# Patient Record
Sex: Male | Born: 1935
Health system: Southern US, Community
[De-identification: ages and names within clinical notes are randomized; demographics above are authoritative.]

## PROBLEM LIST (undated history)

## (undated) DIAGNOSIS — H269 Unspecified cataract: Secondary | ICD-10-CM

## (undated) DIAGNOSIS — K56609 Unspecified intestinal obstruction, unspecified as to partial versus complete obstruction: Secondary | ICD-10-CM

## (undated) DIAGNOSIS — T7840XA Allergy, unspecified, initial encounter: Secondary | ICD-10-CM

## (undated) DIAGNOSIS — R0600 Dyspnea, unspecified: Secondary | ICD-10-CM

## (undated) DIAGNOSIS — D649 Anemia, unspecified: Secondary | ICD-10-CM

## (undated) DIAGNOSIS — Z87442 Personal history of urinary calculi: Secondary | ICD-10-CM

## (undated) DIAGNOSIS — J189 Pneumonia, unspecified organism: Secondary | ICD-10-CM

## (undated) DIAGNOSIS — E785 Hyperlipidemia, unspecified: Secondary | ICD-10-CM

## (undated) DIAGNOSIS — F329 Major depressive disorder, single episode, unspecified: Secondary | ICD-10-CM

## (undated) DIAGNOSIS — M1990 Unspecified osteoarthritis, unspecified site: Secondary | ICD-10-CM

## (undated) DIAGNOSIS — Z5189 Encounter for other specified aftercare: Secondary | ICD-10-CM

## (undated) DIAGNOSIS — F32A Depression, unspecified: Secondary | ICD-10-CM

## (undated) DIAGNOSIS — F419 Anxiety disorder, unspecified: Secondary | ICD-10-CM

## (undated) HISTORY — DX: Unspecified cataract: H26.9

## (undated) HISTORY — PX: COLONOSCOPY: SHX174

## (undated) HISTORY — DX: Anemia, unspecified: D64.9

## (undated) HISTORY — DX: Allergy, unspecified, initial encounter: T78.40XA

## (undated) HISTORY — DX: Encounter for other specified aftercare: Z51.89

## (undated) HISTORY — PX: EYE SURGERY: SHX253

## (undated) HISTORY — DX: Hyperlipidemia, unspecified: E78.5

## (undated) HISTORY — PX: ABDOMINAL SURGERY: SHX537

## (undated) HISTORY — PX: CHOLECYSTECTOMY: SHX55

## (undated) HISTORY — PX: APPENDECTOMY: SHX54

## (undated) HISTORY — PX: COLOSTOMY REVERSAL: SHX5782

## (undated) HISTORY — DX: Major depressive disorder, single episode, unspecified: F32.9

## (undated) HISTORY — DX: Depression, unspecified: F32.A

## (undated) HISTORY — PX: COLOSTOMY: SHX63

## (undated) HISTORY — PX: JOINT REPLACEMENT: SHX530

---

## 1998-03-08 ENCOUNTER — Encounter: Payer: Self-pay | Admitting: Orthopaedic Surgery

## 1998-03-12 ENCOUNTER — Encounter: Payer: Self-pay | Admitting: Orthopaedic Surgery

## 1998-03-12 ENCOUNTER — Inpatient Hospital Stay (HOSPITAL_COMMUNITY): Admission: RE | Admit: 1998-03-12 | Discharge: 1998-03-17 | Payer: Self-pay | Admitting: Orthopaedic Surgery

## 1998-03-13 ENCOUNTER — Encounter: Payer: Self-pay | Admitting: Orthopaedic Surgery

## 1998-03-16 ENCOUNTER — Encounter: Payer: Self-pay | Admitting: Family Medicine

## 1998-03-28 ENCOUNTER — Encounter (HOSPITAL_COMMUNITY): Admission: RE | Admit: 1998-03-28 | Discharge: 1998-06-26 | Payer: Self-pay | Admitting: Orthopaedic Surgery

## 1998-04-23 ENCOUNTER — Ambulatory Visit (HOSPITAL_COMMUNITY): Admission: RE | Admit: 1998-04-23 | Discharge: 1998-04-23 | Payer: Self-pay | Admitting: Family Medicine

## 1999-02-07 ENCOUNTER — Encounter: Admission: RE | Admit: 1999-02-07 | Discharge: 1999-02-07 | Payer: Self-pay | Admitting: Orthopaedic Surgery

## 1999-02-07 ENCOUNTER — Encounter: Payer: Self-pay | Admitting: Orthopaedic Surgery

## 1999-09-26 ENCOUNTER — Encounter: Payer: Self-pay | Admitting: Family Medicine

## 1999-09-26 ENCOUNTER — Ambulatory Visit (HOSPITAL_COMMUNITY): Admission: RE | Admit: 1999-09-26 | Discharge: 1999-09-26 | Payer: Self-pay | Admitting: Family Medicine

## 2003-09-21 ENCOUNTER — Encounter: Admission: RE | Admit: 2003-09-21 | Discharge: 2003-09-21 | Payer: Self-pay | Admitting: Family Medicine

## 2005-02-04 ENCOUNTER — Inpatient Hospital Stay (HOSPITAL_COMMUNITY): Admission: EM | Admit: 2005-02-04 | Discharge: 2005-02-07 | Payer: Self-pay | Admitting: *Deleted

## 2005-09-24 ENCOUNTER — Inpatient Hospital Stay (HOSPITAL_COMMUNITY): Admission: RE | Admit: 2005-09-24 | Discharge: 2005-09-27 | Payer: Self-pay | Admitting: Orthopaedic Surgery

## 2005-09-25 ENCOUNTER — Ambulatory Visit: Payer: Self-pay | Admitting: Physical Medicine & Rehabilitation

## 2009-09-05 ENCOUNTER — Inpatient Hospital Stay (HOSPITAL_COMMUNITY): Admission: EM | Admit: 2009-09-05 | Discharge: 2009-09-13 | Payer: Self-pay | Admitting: Emergency Medicine

## 2009-09-05 ENCOUNTER — Encounter (INDEPENDENT_AMBULATORY_CARE_PROVIDER_SITE_OTHER): Payer: Self-pay | Admitting: Surgery

## 2010-06-30 LAB — COMPREHENSIVE METABOLIC PANEL
ALT: 37 U/L (ref 0–53)
ALT: 140 U/L — ABNORMAL HIGH (ref 0–53)
ALT: 143 U/L — ABNORMAL HIGH (ref 0–53)
ALT: 88 U/L — ABNORMAL HIGH (ref 0–53)
AST: 25 U/L (ref 0–37)
AST: 114 U/L — ABNORMAL HIGH (ref 0–37)
AST: 115 U/L — ABNORMAL HIGH (ref 0–37)
AST: 65 U/L — ABNORMAL HIGH (ref 0–37)
Albumin: 2.7 g/dL — ABNORMAL LOW (ref 3.5–5.2)
Albumin: 2.7 g/dL — ABNORMAL LOW (ref 3.5–5.2)
Albumin: 2.9 g/dL — ABNORMAL LOW (ref 3.5–5.2)
Albumin: 3.8 g/dL (ref 3.5–5.2)
Alkaline Phosphatase: 52 U/L (ref 39–117)
Alkaline Phosphatase: 56 U/L (ref 39–117)
Alkaline Phosphatase: 68 U/L (ref 39–117)
Alkaline Phosphatase: 73 U/L (ref 39–117)
BUN: 11 mg/dL (ref 6–23)
BUN: 11 mg/dL (ref 6–23)
BUN: 12 mg/dL (ref 6–23)
BUN: 12 mg/dL (ref 6–23)
CO2: 23 mEq/L (ref 19–32)
CO2: 26 mEq/L (ref 19–32)
CO2: 27 mEq/L (ref 19–32)
CO2: 29 mEq/L (ref 19–32)
Calcium: 8.7 mg/dL (ref 8.4–10.5)
Calcium: 8.2 mg/dL — ABNORMAL LOW (ref 8.4–10.5)
Calcium: 8.4 mg/dL (ref 8.4–10.5)
Calcium: 9.3 mg/dL (ref 8.4–10.5)
Chloride: 113 mEq/L — ABNORMAL HIGH (ref 96–112)
Chloride: 100 mEq/L (ref 96–112)
Chloride: 102 mEq/L (ref 96–112)
Chloride: 99 mEq/L (ref 96–112)
Creatinine, Ser: 0.8 mg/dL (ref 0.4–1.5)
Creatinine, Ser: 1.11 mg/dL (ref 0.4–1.5)
Creatinine, Ser: 1.13 mg/dL (ref 0.4–1.5)
Creatinine, Ser: 1.25 mg/dL (ref 0.4–1.5)
GFR calc Af Amer: >60 mL/min (ref >60)
GFR calc Af Amer: >60 mL/min (ref >60)
GFR calc Af Amer: >60 mL/min (ref >60)
GFR calc Af Amer: >60 mL/min (ref >60)
GFR calc non Af Amer: >60 mL/min (ref >60)
GFR calc non Af Amer: 57 mL/min — ABNORMAL LOW (ref >60)
GFR calc non Af Amer: >60 mL/min (ref >60)
GFR calc non Af Amer: >60 mL/min (ref >60)
Glucose, Bld: 134 mg/dL — ABNORMAL HIGH (ref 70–99)
Glucose, Bld: 122 mg/dL — ABNORMAL HIGH (ref 70–99)
Glucose, Bld: 144 mg/dL — ABNORMAL HIGH (ref 70–99)
Glucose, Bld: 157 mg/dL — ABNORMAL HIGH (ref 70–99)
Potassium: 3.8 mEq/L (ref 3.5–5.1)
Potassium: 3.3 mEq/L — ABNORMAL LOW (ref 3.5–5.1)
Potassium: 3.7 mEq/L (ref 3.5–5.1)
Potassium: 4 mEq/L (ref 3.5–5.1)
Sodium: 142 mEq/L (ref 135–145)
Sodium: 133 mEq/L — ABNORMAL LOW (ref 135–145)
Sodium: 135 mEq/L (ref 135–145)
Sodium: 136 mEq/L (ref 135–145)
Total Bilirubin: 0.8 mg/dL (ref 0.3–1.2)
Total Bilirubin: 1.7 mg/dL — ABNORMAL HIGH (ref 0.3–1.2)
Total Bilirubin: 2.2 mg/dL — ABNORMAL HIGH (ref 0.3–1.2)
Total Bilirubin: 2.9 mg/dL — ABNORMAL HIGH (ref 0.3–1.2)
Total Protein: 6.5 g/dL (ref 6.0–8.3)
Total Protein: 6.1 g/dL (ref 6.0–8.3)
Total Protein: 6.3 g/dL (ref 6.0–8.3)
Total Protein: 7.3 g/dL (ref 6.0–8.3)

## 2010-06-30 LAB — CBC
HCT: 38.6 % — ABNORMAL LOW (ref 39.0–52.0)
HCT: 33.3 % — ABNORMAL LOW (ref 39.0–52.0)
HCT: 36.4 % — ABNORMAL LOW (ref 39.0–52.0)
HCT: 36.5 % — ABNORMAL LOW (ref 39.0–52.0)
HCT: 42.3 % (ref 39.0–52.0)
Hemoglobin: 13.1 g/dL (ref 13.0–17.0)
Hemoglobin: 11.3 g/dL — ABNORMAL LOW (ref 13.0–17.0)
Hemoglobin: 12.3 g/dL — ABNORMAL LOW (ref 13.0–17.0)
Hemoglobin: 12.5 g/dL — ABNORMAL LOW (ref 13.0–17.0)
Hemoglobin: 14.2 g/dL (ref 13.0–17.0)
MCHC: 34 g/dL (ref 30.0–36.0)
MCHC: 33.5 g/dL (ref 30.0–36.0)
MCHC: 33.7 g/dL (ref 30.0–36.0)
MCHC: 33.9 g/dL (ref 30.0–36.0)
MCHC: 34.2 g/dL (ref 30.0–36.0)
MCV: 96.6 fL (ref 78.0–100.0)
MCV: 96.7 fL (ref 78.0–100.0)
MCV: 96.7 fL (ref 78.0–100.0)
MCV: 97 fL (ref 78.0–100.0)
MCV: 97.5 fL (ref 78.0–100.0)
Platelets: 408 10*3/uL — ABNORMAL HIGH (ref 150–400)
Platelets: 194 10*3/uL (ref 150–400)
Platelets: 197 10*3/uL (ref 150–400)
Platelets: 212 10*3/uL (ref 150–400)
Platelets: 339 10*3/uL (ref 150–400)
RBC: 4 MIL/uL — ABNORMAL LOW (ref 4.22–5.81)
RBC: 3.44 MIL/uL — ABNORMAL LOW (ref 4.22–5.81)
RBC: 3.73 MIL/uL — ABNORMAL LOW (ref 4.22–5.81)
RBC: 3.76 MIL/uL — ABNORMAL LOW (ref 4.22–5.81)
RBC: 4.38 MIL/uL (ref 4.22–5.81)
RDW: 14 % (ref 11.5–15.5)
RDW: 13 % (ref 11.5–15.5)
RDW: 13.1 % (ref 11.5–15.5)
RDW: 13.1 % (ref 11.5–15.5)
RDW: 13.4 % (ref 11.5–15.5)
WBC: 7.8 10*3/uL (ref 4.0–10.5)
WBC: 10.9 10*3/uL — ABNORMAL HIGH (ref 4.0–10.5)
WBC: 13.9 10*3/uL — ABNORMAL HIGH (ref 4.0–10.5)
WBC: 8.6 10*3/uL (ref 4.0–10.5)
WBC: 8.6 10*3/uL (ref 4.0–10.5)

## 2010-06-30 LAB — DIFFERENTIAL
Basophils Absolute: 0.1 10*3/uL (ref 0.0–0.1)
Basophils Relative: 1 % (ref 0–1)
Eosinophils Absolute: 0 10*3/uL (ref 0.0–0.7)
Eosinophils Relative: 0 % (ref 0–5)
Lymphocytes Relative: 4 % — ABNORMAL LOW (ref 12–46)
Lymphs Abs: 0.6 10*3/uL — ABNORMAL LOW (ref 0.7–4.0)
Monocytes Absolute: 1.3 10*3/uL — ABNORMAL HIGH (ref 0.1–1.0)
Monocytes Relative: 9 % (ref 3–12)
Neutro Abs: 12 10*3/uL — ABNORMAL HIGH (ref 1.7–7.7)
Neutrophils Relative %: 86 % — ABNORMAL HIGH (ref 43–77)

## 2010-06-30 LAB — BASIC METABOLIC PANEL
BUN: 10 mg/dL (ref 6–23)
CO2: 27 mEq/L (ref 19–32)
CO2: 32 mEq/L (ref 19–32)
Calcium: 8.2 mg/dL — ABNORMAL LOW (ref 8.4–10.5)
Calcium: 8.6 mg/dL (ref 8.4–10.5)
Chloride: 101 mEq/L (ref 96–112)
Creatinine, Ser: 0.95 mg/dL (ref 0.4–1.5)
GFR calc Af Amer: >60 mL/min (ref >60)
GFR calc non Af Amer: >60 mL/min (ref >60)
Glucose, Bld: 163 mg/dL — ABNORMAL HIGH (ref 70–99)
Glucose, Bld: 190 mg/dL — ABNORMAL HIGH (ref 70–99)
Potassium: 2.9 mEq/L — ABNORMAL LOW (ref 3.5–5.1)
Sodium: 142 mEq/L (ref 135–145)
Sodium: 145 mEq/L (ref 135–145)

## 2010-06-30 LAB — PROTIME-INR
INR: 1.29 (ref 0.00–1.49)
Prothrombin Time: 16 seconds — ABNORMAL HIGH (ref 11.6–15.2)

## 2010-06-30 LAB — URINE CULTURE
Colony Count: 30000
Special Requests: NEGATIVE

## 2010-06-30 LAB — LIPASE, BLOOD: Lipase: 36 U/L (ref 11–59)

## 2010-06-30 LAB — AMYLASE: Amylase: 66 U/L (ref 0–105)

## 2010-06-30 LAB — APTT: aPTT: 34 seconds (ref 24–37)

## 2010-08-11 ENCOUNTER — Emergency Department (HOSPITAL_COMMUNITY): Payer: Medicare Other

## 2010-08-11 ENCOUNTER — Inpatient Hospital Stay (HOSPITAL_COMMUNITY)
Admission: EM | Admit: 2010-08-11 | Discharge: 2010-08-14 | DRG: 390 | Disposition: A | Discharge status: Home / Self Care | Payer: Medicare Other | Attending: Surgery | Admitting: Surgery

## 2010-08-11 DIAGNOSIS — M459 Ankylosing spondylitis of unspecified sites in spine: Secondary | ICD-10-CM | POA: Diagnosis present

## 2010-08-11 DIAGNOSIS — R197 Diarrhea, unspecified: Secondary | ICD-10-CM | POA: Diagnosis present

## 2010-08-11 DIAGNOSIS — E785 Hyperlipidemia, unspecified: Secondary | ICD-10-CM | POA: Diagnosis present

## 2010-08-11 DIAGNOSIS — Z87442 Personal history of urinary calculi: Secondary | ICD-10-CM

## 2010-08-11 DIAGNOSIS — F3289 Other specified depressive episodes: Secondary | ICD-10-CM | POA: Diagnosis present

## 2010-08-11 DIAGNOSIS — F329 Major depressive disorder, single episode, unspecified: Secondary | ICD-10-CM | POA: Diagnosis present

## 2010-08-11 DIAGNOSIS — Z9089 Acquired absence of other organs: Secondary | ICD-10-CM

## 2010-08-11 DIAGNOSIS — K56609 Unspecified intestinal obstruction, unspecified as to partial versus complete obstruction: Principal | ICD-10-CM | POA: Diagnosis present

## 2010-08-11 LAB — URINALYSIS, ROUTINE W REFLEX MICROSCOPIC
Glucose, UA: NEGATIVE mg/dL
Protein, ur: NEGATIVE mg/dL
pH: 6.5 (ref 5.0–8.0)

## 2010-08-11 LAB — DIFFERENTIAL
Eosinophils Relative: 1 % (ref 0–5)
Lymphocytes Relative: 6 % — ABNORMAL LOW (ref 12–46)
Lymphs Abs: 0.8 10*3/uL (ref 0.7–4.0)
Monocytes Absolute: 0.9 10*3/uL (ref 0.1–1.0)

## 2010-08-11 LAB — CBC
HCT: 46.4 % (ref 39.0–52.0)
MCHC: 34.7 g/dL (ref 30.0–36.0)
MCV: 92.1 fL (ref 78.0–100.0)
RDW: 12.8 % (ref 11.5–15.5)

## 2010-08-11 LAB — COMPREHENSIVE METABOLIC PANEL
BUN: 18 mg/dL (ref 6–23)
Calcium: 9.6 mg/dL (ref 8.4–10.5)
Glucose, Bld: 142 mg/dL — ABNORMAL HIGH (ref 70–99)
Total Protein: 7.9 g/dL (ref 6.0–8.3)

## 2010-08-11 LAB — HEMOGLOBIN AND HEMATOCRIT, BLOOD
HCT: 43.2 % (ref 39.0–52.0)
Hemoglobin: 14.7 g/dL (ref 13.0–17.0)

## 2010-08-11 LAB — AMYLASE: Amylase: 68 U/L (ref 0–105)

## 2010-08-11 MED ORDER — IOHEXOL 300 MG/ML  SOLN
100.0000 mL | Freq: Once | INTRAMUSCULAR | Status: AC | PRN
  Administered 2010-08-11: 100 mL via INTRAVENOUS

## 2010-08-12 ENCOUNTER — Inpatient Hospital Stay (HOSPITAL_COMMUNITY): Payer: Medicare Other

## 2010-08-12 LAB — CBC
HCT: 43.8 % (ref 39.0–52.0)
Hemoglobin: 14.7 g/dL (ref 13.0–17.0)
MCH: 31.3 pg (ref 26.0–34.0)
MCHC: 33.6 g/dL (ref 30.0–36.0)
MCV: 93.2 fL (ref 78.0–100.0)
Platelets: 253 K/uL (ref 150–400)
RBC: 4.7 MIL/uL (ref 4.22–5.81)
RDW: 12.6 % (ref 11.5–15.5)
WBC: 11 K/uL — ABNORMAL HIGH (ref 4.0–10.5)

## 2010-08-12 LAB — BASIC METABOLIC PANEL WITH GFR
BUN: 9 mg/dL (ref 6–23)
CO2: 28 meq/L (ref 19–32)
Calcium: 8.7 mg/dL (ref 8.4–10.5)
Chloride: 103 meq/L (ref 96–112)
Creatinine, Ser: 0.9 mg/dL (ref 0.4–1.5)
GFR calc non Af Amer: >60 mL/min
Glucose, Bld: 116 mg/dL — ABNORMAL HIGH (ref 70–99)
Potassium: 4.1 meq/L (ref 3.5–5.1)
Sodium: 138 meq/L (ref 135–145)

## 2010-08-26 NOTE — H&P (Signed)
Steven [name], Steven [name]                 ACCOUNT NO.:  [id number]  MEDICAL RECORD NO.:  [id number]           PATIENT TYPE:  I  LOCATION:  1527                         FACILITY:  [facility]  PHYSICIAN:  Ardeth Sportsman, MD     DATE OF BIRTH:  [birth year]  DATE OF ADMISSION:  08/11/2010 DATE OF DISCHARGE:                             HISTORY & PHYSICAL   CHIEF COMPLAINT:  Abdominal pain, nausea, vomiting, started last p.m.  BRIEF HISTORY:  The patient is a 76 year old white male who was doing well yesterday, had normal breakfast and lunch.  After dinner, he noted acute onset of abdominal distention, then significant abdominal pain followed by four episodes of nausea and vomiting so far.  In the ER, he was treated with Zofran, and an NG has been placed at this point, but he is still having nausea and thought he was going to throw up.  We reposition his NG tube.  He had an episode two weeks ago that was similar, but it resolved on its own.  PAST MEDICAL HISTORY: 1. He has had recurrent small bowel obstructions dating back October     2006 and 1995. 2. He had a diverticular abscess with resection in 1982 by Dr.     Rolene Course. 3. He had a reanastomosis of his colostomy in 79 by Dr. Abbey Chatters. 4. He had a lysis of adhesions in 2006 by Dr. Luisa Hart.  Additional problems include: 1. Ankylosing spondylitis. 2. Dyslipidemia. 3. History of hypogonadism.  SURGERIES: 1. Status post colectomy on Sep 05, 2009. 2. Exploratory laparotomy in 1995 for small-bowel obstruction. 3. Colon resection reanastomosis 1982 and 1983. 4. Right hip revision in June 2007. 5. History of renal calculi without obstruction.  FAMILY HISTORY:  Father died at 42.  Mother died at 53.  Two brothers, one deceased with emphysema, one in good health.  No sisters.  SOCIAL HISTORY:  Smoked about 18 years, quit in 1968.  He drinks four beers per day.  Drugs:  None.  He is a retired Psychologist, occupational.  He is married.  His wife is  with him.  REVIEW OF SYSTEMS:  FEVER:  None.  WEIGHT:  No changes. CEREBROVASCULAR:  No headaches, dizziness, syncope, history of seizure or stroke.  PSYCH:  No changes.  He has some chronic depression.  He is on medications.  SKIN:  No changes.  CARDIAC:  No history of chest pain, palpitations, or workup.  PULMONARY:  No orthopnea.  No dyspnea on exertion.  No coughing or wheezing or recent URI.  GI:  Had abdominal pain about two weeks ago, resolved on its own, reoccurred last night. Positive GERD currently.  Normally, no nausea, vomiting, diarrhea, constipation, or blood.  GU:  Negative.  LOWER EXTREMITIES:  No edema. No claudication.  MUSCULOSKELETAL:  Positive for ankylosing spondylitis. He takes a nonsteroidal b.i.d.  MEDICATIONS: 1. Simvastatin 40 mg daily. 2. TriCor 145 mg daily. 3. Celebrex 200 mg b.i.d. 4. __________ 40 mg daily. 5. Folbic one daily. 6. Calcium 600 mg b.i.d. 7. Vitamin D.  ALLERGIES:  INDOCIN causes hives.  PHYSICAL EXAM:  GENERAL:  He is a well-nourished, well-developed white male in no acute distress.  His abdomen is distended.  He is fairly uncomfortable.  Currently, pain is much better than before. VITAL SIGNS:  Temperature was 98.1, heart rate 75, blood pressure 135/85, respiratory rate is 70, sats 95% on room air. HEAD:  Normocephalic. EYES:  Pupils are equal, reactive to light. EARS, NOSE, THROAT, AND MOUTH:  Within normal limits. NECK:  No bruits.  No JVD.  No thyromegaly. CHEST:  Clear to auscultation and percussion. CARDIAC:  No murmur or rub was heard.  Normal S1 and S2.  Pulses are +2 and equal in upper and lower extremities. ABDOMEN:  Hyperactive bowel sounds.  Abdomen is markedly distended, slightly tender, not as much as before.  No hernia, masses, or abscesses.  There are multiple surgical scars. GU/RECTAL:  Deferred.  Lymphadenopathy none palpated. MUSCULOSKELETAL:  No acute changes noted.  He has ankylosing spondylitis. SKIN:  No  changes.  Decubitus, none. NEUROLOGIC: No focal deficits.  Cranial nerves are grossly normal. PSYCH:  Normal affect.  LABORATORY DATA:  UA was negative.  White count is 13.4, hemoglobin of 16, hematocrit 46, platelets of 269,000.  Sodium is 139, potassium is 4.3, chloride is 0.1, CO2 is 26, BUN is 18, creatinine is 0.93, glucose 142.  LFTs were normal.  Three-way shows air-fluid levels of small bowel, ileus versus mechanical obstruction.  CT shows mechanical small bowel obstructions, transition anterior abdomen, ? adhesions, similar to the February 04, 2005, film.  Nephrolithiasis, mild obstruction.  IMPRESSION: 1. Small bowel obstruction with multiple surgeries.  Prior small bowel     obstruction with lysis of adhesions. 2. History of cholecystectomy. 3. History of depression. 4. Dyslipidemia. 5. On nonsteroidal anti-inflammatory drugs. 6. Renal calculus.  PLAN:  NG tube is currently working.  There was some bloody-looking drainage in the NG.  We will guaiac it, and I will put him on H2 blocker.  Keep him on bowel rest.  Maintain NG drainage.  Hope that with that, his obstruction resolves.     Eber Hong, P.A.   ______________________________ Ardeth Sportsman, MD    WDJ/MEDQ  D:  08/11/2010  T:  08/11/2010  Job:  [id number]  cc:   Thayer Headings, M.D. Fax: [phone]  Electronically Signed by Sherrie George P.A. on 08/18/2010 04:22:34 PM Electronically Signed by Karie Soda MD on 08/26/2010 12:04:11 PM

## 2010-08-26 NOTE — Discharge Summary (Signed)
NAMEDONTRE, LADUCA                 ACCOUNT NO.:  [id number]  MEDICAL RECORD NO.:  [id number]           PATIENT TYPE:  I  LOCATION:  1527                         FACILITY:  [facility]  PHYSICIAN:  Ardeth Sportsman, MD     DATE OF BIRTH:  03/31/1935  DATE OF ADMISSION:  08/11/2010 DATE OF DISCHARGE:  08/14/2010                              DISCHARGE SUMMARY   ADMISSION DIAGNOSES: 1. Small bowel obstruction status post multiple surgeries and prior     small bowel obstruction with lysis of adhesions. 2. History of cholecystectomy. 3. History of depression. 4. Dyslipidemia. 5. The patient is on nonsteroidal anti-inflammatory drugs. 6. History of renal calculi. 7. Ankylosing spondylitis. 8. History of hypogonadism.  DISCHARGE DIAGNOSES: 1. Small bowel obstruction status post multiple surgeries and prior     small bowel obstruction with lysis of adhesions. 2. History of cholecystectomy. 3. History of depression. 4. Dyslipidemia. 5. The patient is on nonsteroidal anti-inflammatory drugs. 6. History of renal calculi. 7. Ankylosing spondylitis. 8. History of hypogonadism.  PROCEDURES:  CT of the abdomen and pelvis on August 11, 2010, which shows partial mechanical small bowel obstruction with potential transition point in the mid anterior abdomen, possibly related to adhesions similar to partial obstruction of February 04, 2005.  Chronic dilatation of small bowel loops in left lower quadrant and nephrolithiasis, nonobstructive.  BRIEF HISTORY:  The patient is a 76 year old gentleman who was doing well the day prior to admission, had a normal breakfast and lunch and after dinner he had acute onset of abdominal distention, then significant abdominal pain followed by 4 episodes of nausea and vomiting.  In the ER, he was treated with Zofran.  NG has been placed. At this point, he is still having nausea and thought that he was going to throw up when I came in.  We repositioned the NG tube and  his nausea seemed to improve.  He had a similar episode 2 weeks prior, but it resolved on its own and did not last as long.  PAST MEDICAL HISTORY:  Past medical history includes a history of small bowel obstruction back in 2006 and 1995, diverticular abscess with resection by Dr. Rolene Course, I think, in 1982 and then he had reanastomosis of his colostomy in 1983 by Dr. Abbey Chatters.  He had lysis of adhesions by Dr. Luisa Hart in 2006.  He has had a cholecystectomy.  For further history and physical, please see the dictated note.  HOSPITAL COURSE:  The patient was seen in the emergency room and admitted with a small bowel obstruction.  NG tube was placed.  He was placed on bowel rest and IV fluids.  We also placed him on a PPI for possible GERD symptoms.  He made slow, steady progress with minimal drainage through his NG that was actually repositioned on Aug 12, 2010, but he never used it, he remained clamped, so we removed the NG on Aug 13, 2010, and started him on clear liquids.  By Aug 14, 2010, he was up to a soft diet for breakfast and doing well.  He was having multiple stools.  He has  a problem with chronic diarrhea since his cholecystectomy.  He also had some hemorrhoidal bleeding with his multiple bouts of diarrhea.  At this point, I plan to saline lock his IV, mobilize him and see how he does with lunch and if he does well, plan to discharge him home.  MEDICATIONS ON DISCHARGE:  He will go home on all of his preadmission medicines, which include: 1. AndroGel 1 packet topically daily. 2. Imodium 1 tablet p.r.n. 3. Calcium carbonate 1 tablet daily. 4. Celebrex 200 mg b.i.d. 5. Citalopram 40 mg daily. 6. Folic 25/2.5/2 one daily. 7. Simvastatin 40 mg daily. 8. TriCor 145 mg daily. 9. Vitamin D 400 units daily. 10.The only new drug will be Anusol-HC.  We will send home a tube with     him.  FOLLOWUP:  He was instructed to call our office if he has any further problems.  We can see  him back.  If not, he can follow up with Dr. Ronne Binning, his primary care physician on a routine basis.     Eber Hong, P.A.   ______________________________ Ardeth Sportsman, MD    WDJ/MEDQ  D:  08/14/2010  T:  08/14/2010  Job:  [id number]  cc:   Thayer Headings, M.D. Fax: [phone]  Ardeth Sportsman, MD [address] [place] [address]  Electronically Signed by Sherrie George P.A. on 08/18/2010 04:23:57 PM Electronically Signed by Karie Soda MD on 08/26/2010 12:04:08 PM

## 2010-08-29 NOTE — Discharge Summary (Signed)
NAMEJAKARIE, PEMBER                 ACCOUNT NO.:  [id number]   MEDICAL RECORD NO.:  [id number]          PATIENT TYPE:  INP   LOCATION:  1618                         FACILITY:  [facility]   PHYSICIAN:  Hillery Aldo, M.D.   DATE OF BIRTH:  1934/10/21   DATE OF ADMISSION:  02/03/2005  DATE OF DISCHARGE:  02/07/2005                                 DISCHARGE SUMMARY   PRIMARY CARE PHYSICIAN:  Dr. [name].   DISCHARGE DIAGNOSES:  1.  Recurrent small bowel obstruction, resolved.  2.  Nausea and vomiting secondary to #1, resolved.  3.  Depression.  4.  Hypercholesterolemia.  5.  Ankylosing spondylitis.  6.  History of diverticulitis.  7.  Nonobstructing bilateral renal calculi.   DISCHARGE MEDICATIONS:  1.  Zocor 40 mg daily.  2.  Tricor 160 mg daily.  3.  Citalopram 20 mg daily.  4.  Calcium supplement 500 mg twice daily.  5.  Celebrex 200 mg daily.   CONSULTANTS:  Dr. Derrell Lolling of general surgery.   PROCEDURES AND DIAGNOSTIC STUDIES:  1.  Acute abdominal series on February 03, 2005, showed findings consistent      with a partial small bowel obstruction.  2.  CT scan of the abdomen and pelvis on February 04, 2005, showed small      bowel obstruction with a transition point in the left lower abdomen and      nonobstructing bilateral renal calculi.  The small bowel obstruction was      thought to be likely mid distal jejunum.  3.  Two views of the abdomen on February 05, 2005, showed decompression of      the bowel with an NG tube in place.  Ankylosing spondylitis was also      seen.  4.  Two views of the abdomen on February 06, 2005, showed a nonspecific,      nonobstructive bowel gas pattern with no evidence for ileus.   DISCHARGE LABORATORY VALUES:  CBC showed a white blood cell count of 5.4,  hemoglobin 12.8, hematocrit 37.1, platelets 279.  Sodium was 140, potassium  3.6, chloride 101, bicarb 29, BUN 7, creatinine 1.0, glucose 101.   BRIEF ADMISSION HISTORY OF PRESENT ILLNESS:   The patient is a 76 year old  white male, who was admitted with a 12 hour history of nausea, vomiting, and  generalized abdominal pain.  He had a history of small bowel obstructions in  the past as well as a history of bowel resection and adhesiolysis.  He was  admitted for further evaluation, surgical consultation, and treatment.   HOSPITAL COURSE BY PROBLEM:  #1 - NAUSEA AND VOMITING SECONDARY TO SMALL  BOWEL OBSTRUCTION:  The patient was admitted, and an NG tube was placed.  This resulted in rapid decompression of his small bowel with resolution of  his nausea and vomiting.  He also received antiemetics p.r.n.  A surgery  consultation was obtained and kindly provided by Dr. Derrell Lolling.  With  conservative treatment and NPO status, the patient was able to tolerate  clear liquids once the NG tube was out on  hospital day #2.  His diet was  gradually progressed and at the time of discharge, was able to tolerate a  low residue diet without any recurrence of nausea, vomiting, or abdominal  pain.  He was moving his bowels normally prior to discharge.   #2 - HYPERCHOLESTEROLEMIA:  The patient was maintained on his usual dose of  Tricor and Zocor once he was able to take p.o.'s.   #3 - ANKYLOSING SPONDYLITIS:  The patient received pain medications p.r.n.   #4 - DEPRESSION:  The patient was discharged on his home dose of citalopram.   DISPOSITION:  The patient will be discharged home.  He is to follow up with  his primary care physician, Dr. Artis Flock, in 1-2 weeks.  He should call his  primary care physician for any return of nausea, vomiting, or abdominal  pain.   DISCHARGE INSTRUCTIONS:  The patient was instructed to increase activity  slowly and to eat a low residue diet until he is seen by his primary care  physician in follow up.   CONDITION AT DISCHARGE:  Improved.           ______________________________  Hillery Aldo, M.D.     CR/MEDQ  D:  02/07/2005  T:  02/07/2005  Job:   [id number]   cc:   Quita Skye. Artis Flock, M.D.  Fax: [phone]   Angelia Mould. Derrell Lolling, M.D.  [address]  [place]  [place] [address]

## 2010-08-29 NOTE — H&P (Signed)
NAMESERIGNE, Steven [name]                 ACCOUNT NO.:  [id number]   MEDICAL RECORD NO.:  [id number]          PATIENT TYPE:  EMS   LOCATION:  ED                           FACILITY:  [facility]   PHYSICIAN:  Renato Battles, M.D.     DATE OF BIRTH:  August 03, 1934   DATE OF ADMISSION:  02/04/2005  DATE OF DISCHARGE:                                HISTORY & PHYSICAL   REASON FOR ADMISSION:  Nausea and vomiting.   PRIMARY CARE PHYSICIAN:  Quita Skye. Artis Flock, M.D.   SURGEON:  Adolph Pollack, M.D.   HISTORY OF PRESENT ILLNESS:  The patient is a 76 year old white male with 12  hours of nausea, vomiting, and generalized abdominal pain which started  early afternoon.   REVIEW OF SYSTEMS:  CONSTITUTIONAL SYMPTOMS:  No fever, chills, or night  sweats.  No weight changes.  GI:  Positive for nausea, vomiting, and  abdominal pain.  No diarrhea or constipation.  GU:  No dysuria, hematuria,  or retention.  CARDIOPULMONARY:  No chest pain, shortness of breath, or  orthopnea.  No PND or cough.   PAST MEDICAL HISTORY:  1.  History of diverticulitis.  2.  Hypercholesterolemia.  3.  History of small bowel obstruction requiring surgery.  4.  Depression.   PAST SURGICAL HISTORY:  1.  Aortic replacement.  2.  Bowel resection.  3.  Adhesiolysis.   FAMILY HISTORY:  Negative.   SOCIAL HISTORY:  Positive for 3-4 beers a day.  No tobacco or drugs.  He is  retired.  He lives with his wife.   ALLERGIES:  Indomethacin.   HOME MEDICATIONS:  1.  Zocor 40 mg p.o. nightly.  2.  Celebrex, unknown dose.  3.  Triglide 160 mg daily.  4.  Citalopram 20 mg daily.  5.  Calcium supplement.   PHYSICAL EXAMINATION:  GENERAL:  Alert and oriented x3, in no acute  distress.  VITAL SIGNS:  Temperature 98.3, heart rate 74, respiratory rate 16, blood  pressure 126/75.  HEENT:  Atraumatic and normocephalic.  Pupils equal, round and reactive to  light and accommodation.  Extraocular movements intact bilaterally.  NECK:  No  lymphadenopathy, no thyromegaly, no JVD.  CHEST:  Clear to auscultation bilaterally.  No wheezes, rales, or rhonchi.  HEART:  Regular rate and rhythm.  No murmurs noted.  ABDOMEN:  Distended, soft.  Tender in the left lower quadrant.  Bowel sounds  are absent in the upper half of the abdomen, but present in the lower half.  EXTREMITIES:  No cyanosis, edema, or clubbing.   LABORATORY STUDIES:  CBC showed elevated white count at 11.4 with 90%  neutrophils, but normal hemoglobin and platelets.  Electrolytes were all  within normal limits.  Glucose was mildly elevated to 135.  Liver functions  were perfectly normal.  Urinalysis was negative.  Lipase was normal.  Abdominopelvic CT showed small bowel obstruction with no free air.   ASSESSMENT:  1.  Small bowel obstruction, probably secondary adhesions.  2.  Hypercholesterolemia.  3.  Depression.  4.  Significant intake of alcohol on a daily  basis.   PLAN:  1.  Admit to medical floor.  2.  NPO.  3.  NG tube placement to wall suction.  4.  Dulcolax suppositories.  5.  Continue home medications.  6.  Thiamine and folate daily.      Renato Battles, M.D.  Electronically Signed     SA/MEDQ  D:  02/04/2005  T:  02/04/2005  Job:  [id number]   cc:   Quita Skye. Artis Flock, M.D.  Fax: [phone]   Adolph Pollack, M.D.  [address]  [place]  [place] [address]

## 2010-08-29 NOTE — H&P (Signed)
Steven [name], Steven [name]                 ACCOUNT NO.:  [id number]   MEDICAL RECORD NO.:  [id number]          PATIENT TYPE:  INP   LOCATION:  NA                           FACILITY:  [facility]   PHYSICIAN:  Steven [name], M.D.DATE OF BIRTH:  04-29-1934   DATE OF ADMISSION:  09/24/2005  DATE OF DISCHARGE:                                HISTORY & PHYSICAL   CHIEF COMPLAINT:  Right hip pain for the last 10-12 months.   HISTORY OF PRESENT ILLNESS:  A 76 year old white male patient presented to  [name] with a history of a right total hip done by him in November  1999.  Around 10-12 months ago he had gradual onset of progressively  worsening right hip pain.  He has had no known injury and no further surgery  on that right hip.  He reports about 12 months ago the hip started feeling  like it was kind of sliding out of place and then going right back in, and  now this has increased in frequency that it occurs almost daily.  It is not  completely subluxed where it has required a closed reduction, but it is  getting a bit painful.   At this point the pain is an intermittent dull aching sensation over the  right buttock and trochanter region without radiation.  Pain increases if he  sits for a prolonged period of time or stands for a prolonged period of time  and then decreases with range of motion.  He reports can make it feel like  it will slide out of place if he bends down and ties his shoes.  The hip  does pop.  He otherwise can tie his shoes without difficulty.  He does not  ambulate with any assistive devices.   ALLERGIES:  INDOCIN causes hives.   CURRENT MEDICATIONS:  1.  AndroGel 1% topically applied once a day.  2.  Calcium plus vitamin D 600 mg 1 tablet p.o. b.i.d.  3.  Simvastatin 40 mg 1 tablet p.o. q.a.m.  4.  Celebrex 200 mg 1 tablet p.o. b.i.d.  5.  Tricor 145 mg 1 tablet p.o. q.a.m.  6.  Citalopram 40 mg 1 tablet p.o. q.a.m.  7.  Foltx 2.5/25/2 mg 1 tablet p.o. q.a.m.  8.  Vicodin 5/500 mg 1 tablet p.o. b.i.d.   PAST MEDICAL HISTORY:  1.  Ankylosing spondylitis.  2.  Hyperlipidemia.  3.  Hypogonadism.  4.  History of multiple intestinal blockages requiring surgery, most      recently in October 2006, that was treated conservatively without      surgery.   He denies any history of hypertension, diabetes mellitus, heart disease,  thyroid disease, hiatal hernia, reflux, peptic ulcer disease or asthma or  any other chronic medical condition other than noted previously.   PAST SURGICAL HISTORY:  1.  Right total hip arthroplasty March 12, 1998.  2.  I&D and exploratory laparotomy of an intestinal abscess with formation      of colostomy December 30, 1993.  3.  Resection of his lower intestine with reverse of  his colostomy March      1996.   He denies any complications from the above-mentioned procedures.   SOCIAL HISTORY:  He has a 10 pack-year history of cigarette smoking, which  she quit in 1970.  He drinks alcohol every day, about 4 drinks a day.  He  does not use any snuff nor any drugs.  He is married and lives with his wife  in a [place] house.  He does have two living children.  His medical doctor  is Dr. Bradd [name].   FAMILY HISTORY:  Mother had a history of heart disease, heart attack,  diabetes and breast cancer.  Brother had a history of heart disease, heart  attack, diabetes and pancreatic cancer.   REVIEW OF SYSTEMS:  He does wear glasses at all times.  He has hearing aids  for both ears.  He does have some problems with ringing in his ears.  All  other systems are negative and noncontributory.   PHYSICAL EXAMINATION:  GENERAL:  Well-developed, well-nourished white male  in no acute distress.  Talks easily with examiner.  He has extensive  kyphotic back.  Walks without a limp.  Mood and affect are appropriate.  Height 5 feet 8 inches, weight 157 pounds, BMI is 23.  VITAL SIGNS:  Temperature 97.5 degrees Fahrenheit, pulse 64,  respirations 20  and BP 118/60.  HEENT:  Normocephalic, atraumatic, without frontal or maxillary sinus  tenderness to palpation.  Conjunctivae pink.  Sclerae anicteric.  PERRLA.  EOMs intact.  No visible external ear deformities.  Hearing grossly intact.  Tympanic membranes pearly gray bilaterally with good light reflex.  Nose and  nasal septum midline.  Nasal mucosa pink and moist without exudates or  polyps noted.  Buccal mucosa pink and moist.  Dentition in good repair.  Pharynx without erythema or exudates.  Tongue and uvula midline.  Tongue  without fasciculations and uvula rises equally with phonation.  NECK:  He has absolutely no range of motion of his cervical spine and has  been that way for several years.  Nontender to palpation along the cervical  spine.  No thyromegaly and no visible masses or lesions.  Carotids +2  bilaterally without bruits.  CARDIOVASCULAR:  Heart rate and rhythm regular.  S1, S2 present without  rubs, clicks or murmurs noted.  RESPIRATORY:  Respirations even and unlabored.  Breath sounds clear to  auscultation bilaterally without rales or wheezes noted.  ABDOMEN:  Rounded abdominal contour.  Well-healed incisions.  Bowel sounds  present x4 quadrants.  Soft, nontender to palpation without  hepatosplenomegaly nor CVA tenderness.  Femoral pulses +2 bilaterally.  BACK:  Nontender to palpation along the vertebral column.  BREASTS/GENITOURINARY, RECTAL:  These exams deferred at this time.  MUSCULOSKELETAL:  No obvious deformities, bilateral upper extremities, with  full range of motion of these extremities without pain.  Radial pulses +2  bilaterally.  Full range of motion of his knees, ankles and toes  bilaterally.  DP and PT pulses are +2.  No lower extremity edema.  No calf  pain with palpation and negative Homans' sign bilaterally.   Left hip has full extension and flexion to 100 degrees with full internal and external rotation without pain.  No pain with  palpation about the hip or  with any range of motion.  Right hip has full extension and flexion again  about 100 degrees with no pain with range of motion.  He actually has fairly  full internal and external  rotation without any pain.   NEUROLOGIC:  Alert and oriented x3.  Cranial nerves II-XII are grossly  intact.  Strength 5/5, bilateral upper and lower extremities.  Rapid  alternating movements intact.  Deep tendon reflexes 2+, bilateral upper and  lower extremities.  Sensation intact to light touch.   </RADIOLOGIC FINDINGS  X-rays taken of his right hip in May 2007 showed eccentric positioning of  his right hip consistent with erosions and wear of the polyethylene  component.  No visible cysts are noted.   IMPRESSION:  1.  Eccentric wear, right total hip replacement.  2.  Ankylosing spondylitis.  3.  Hyperlipidemia.  4.  Hypogonadism.  5.  History of intestinal blockage.  6.  No range of motion of the cervical spine.   PLAN:  Mr. Zenon will be admitted to [facility] on September 24, 2005,  where he will undergo a right total hip arthroplasty by Dr. Claude Manges.  [name].  He will undergo all the routine preoperative laboratory tests  and studies prior to this procedure.  If we have any medical issues while he  is hospitalized, we will consult [facility] per Dr. Blair Heys  request.      Legrand Pitts. Duffy, P.A.      Steven Manges. Cleophas Dunker, M.D.  Electronically Signed    KED/MEDQ  D:  09/15/2005  T:  09/15/2005  Job:  [id number]

## 2010-08-29 NOTE — Consult Note (Signed)
Steven [name], Steven [name]                 ACCOUNT NO.:  [id number]   MEDICAL RECORD NO.:  [id number]          PATIENT TYPE:  INP   LOCATION:  0103                         FACILITY:  [facility]   PHYSICIAN:  Angelia Mould. Derrell Lolling, M.D.DATE OF BIRTH:  08/29/1934   DATE OF CONSULTATION:  02/04/2005  DATE OF DISCHARGE:                                   CONSULTATION   REASON FOR CONSULTATION:  Evaluate small bowel obstruction.   HISTORY OF PRESENT ILLNESS:  This is a 76 year old white man who was feeling  well until yesterday at about 2:00 p.m. after lunch when he developed some  left-sided abdominal pain that came on rather abruptly, was not too bad at  first but got worse and led to him coming to the emergency room at about 5  o'clock p.m. yesterday. During his evaluation at about 7:00 p.m., he vomited  a large amount and in fact vomited several times and each time the pain  eased off and then came back a little back. A nasogastric tube was placed,  he was x-rayed and thought to have a small bowel obstruction. He was  admitted by the medical service. We were called this afternoon to evaluate  him for his bowel obstruction.   The patient states that the pain has completely resolved. He states that he  had a good large soft bowel movement this afternoon at 3:30 p.m. and states  that his abdominal distension has resolved.   He had a CT scan early this morning which showed small bowel obstruction  with probable transition zone in the distal jejunum. There was no free  fluid, no free air, no inflammatory process and no solid mass.   PAST HISTORY:  The patient had a two stage resection for a probable  diverticulitis with abscess in 1990 by Dr. Orpah Melter. He had a temporary  colostomy which has since been closed. He underwent a laparotomy and lysis  of adhesions for small bowel obstruction by Dr. Avel Peace in the mid  1990s. He has had a right total knee replacement. He has depression. He has  hyperlipidemia. He has all ankylosing spondylitis.   MEDICATIONS:  1.  Zocor.  2.  Celebrex.  3.  Triglide.  4.  AndroGel 1%.  5.  Citalopram.  6.  Calcium.  7.  Hydrocodone b.i.d.   ALLERGIES:  INDOCIN causes skin rash.   SOCIAL HISTORY:  He lives in [place], he is married, lives with his wife, has  two children. Denies the use of tobacco, drinks 3-4 beers per day.   FAMILY HISTORY:  Father died of old age at 47. Mother died with myocardial  infarction, diabetes and breast cancer.   REVIEW OF SYSTEMS:  All systems reviewed, they are documented in the chart  and noncontributory except as described above.   PHYSICAL EXAMINATION:  GENERAL:  Pleasant, thin, older gentleman who is  alert and pleasant and oriented and in no distress whatsoever.  VITAL SIGNS:  Temperature 98.3, blood pressure 121/67, heart rate 70 and  regular, respirations 20.  HEENT:  Eyes, sclera clear. Extraocular movements  intact.  NECK:  Supple, nontender, no mass, no jugular venous distention.  LUNGS:  Clear to auscultation. No chest wall tenderness.  HEART:  Regular rate and rhythm. No murmur. Radial and femoral pulses are  palpable.  ABDOMEN:  Protuberant but not distended or tympanitic. Bowel sounds are  hypoactive. He has a midline scar which is somewhat complex and a well-  healed colostomy scar on the left side. There are no hernias. There is no  palpable mass. The abdomen is quite soft but he is a little tender on the  left side but no peritoneal signs. No inguinal hernia or incisional hernia  noted.  EXTREMITIES:  He moves all four extremities well with pain or deformity.  NEUROLOGIC:  No gross motor or sensory deficits.   LABORATORY DATA:  Lab work performed yesterday shows a hemoglobin of 15.3,  white count of 11,400. Complete metabolic panel normal. Urinalysis normal.  CT scan is described above.   ASSESSMENT:  1.  Recurrent small bowel obstruction, likely secondary to adhesions. His       current clinical course and exam suggest that there is no evidence for      compromised bowel. I question whether the bowel obstruction is      resolving.  2.  Daily alcohol intake.  3.  Ankylosing spondylitis.  4.  Status post multiple abdominal operations.   PLAN:  I agree with hospitalization. I would recommend that we continue his  nasogastric suction and n.p.o. status. We will reassess him tomorrow with  physical exam, x-rays and lab. I will follow along with you.      Angelia Mould. Derrell Lolling, M.D.  Electronically Signed     HMI/MEDQ  D:  02/04/2005  T:  02/05/2005  Job:  [id number]   cc:   Quita Skye. Artis Flock, M.D.  Fax: [phone]   Hettie Holstein, D.O.

## 2010-08-29 NOTE — Op Note (Signed)
Steven [name], [place]                 ACCOUNT NO.:  [id number]   MEDICAL RECORD NO.:  [id number]          PATIENT TYPE:  INP   LOCATION:  2899                         FACILITY:  [facility]   PHYSICIAN:  Claude Manges. Whitfield, M.D.DATE OF BIRTH:  08/02/1934   DATE OF PROCEDURE:  09/24/2005  DATE OF DISCHARGE:                                 OPERATIVE REPORT   PREOPERATIVE DIAGNOSIS:  Failed polyethylene component, right total hip  replacement.   POSTOPERATIVE DIAGNOSIS:  Failed polyethylene component, right total hip  replacement.   PROCEDURE:  Revision of acetabular polyethylene component and femoral head  of right total hip replacement   SURGEON:  Claude Manges. Cleophas Dunker, M.D.   ASSISTANTS:  Lenard Galloway. Chaney Malling, M.D., and Oris Drone. Petrarca, P.A.-C.   ANESTHESIA:  General orotracheal.   COMPLICATIONS:  None.   Fluid was sent for STAT Gram stain of the right hip joint fluid that was  negative for bacteria.   REVISED COMPONENTS:  A 36 mm diameter femoral head exchanged for the 28 mm  head with a +5 neck length and a Duraloc Marathon acetabular liner +4 with a  10 degree offset posteriorly 56 mm auto diameter and apex hole eliminator  and a dynamic locking ring to fit the 56 mm auto diameter acetabular  component.   PROCEDURE:  With the patient under general orotracheal anesthesia, the Foley  catheter was inserted by the nursing staff.  The patient was then placed in  the lateral decubitus position and carefully padded and positioned with his  history of ankylosing spondylitis.  The patient was secured in the lateral  decubitus position to the operating table with the Innomed Hip System.   The right hip was then prepped with Betadine scrub and then DuraPrep from  the iliac crest to about the mid calf.  Sterile draping was performed.   The previous southern incision was partially utilized and via sharp  dissection carried down to the subcutaneous tissue.  There was abundant  adipose  tissue.  The iliotibial band was identified and incised along the  length of the skin incision.  Self-retaining retractors were inserted.  There was significant atrophy of the abductor musculature.  These were then  bluntly separated to the level of the greater trochanter.  Soft tissue was  then elevated subperiosteally from the posterior aspect of the greater  trochanter removing short external rotators that were significantly scarred  in.  The hip capsule was identified and the old suture was removed.  It was  then incised.  There was gross clear yellow joint effusion that was sent for  culture and sensitivity both aerobically and anaerobically.  STAT Gram stain  was negative for any bacteria and just a few mononuclear cells.  The capsule  was then opened from the edge of the metallic acetabulum to the femoral  neck.  There was a moderate amount of synovitis which was carefully  debrided.  There was also a fair amount of bony overgrowth superiorly and  medially which made the extraction of the polyethylene component difficult.  The extraneous bone was then removed  carefully with a small osteotome and a  rongeur.  At that point we could use the polyethylene extractor.  This was  inserted and the polyethylene was then carefully removed.  The polyethylene  component was then carefully removed from the metallic component.  The  locking ring was also removed as this was an older acetabulum requiring a  locking ring.   The wound was then irrigated with saline solution.  There was considerable  further extraneous bone medially and this was again debrided with an  osteotome as we felt that was contributing to impingement further  synovectomy was performed circumferential.  We had a very nice visualization  of the metal cup which was very securely seated.  Because of its unusual  position with his lightly increased abduction, we questioned whether or not  after initial insertion in 1999 if it had  not slipped some, but at this  point the cup was very well fixed and there did not appear to be any cyst  formation by x-ray, behind the component.   We elected to use a 36 mm auto diameter hip ball as it was more stable and  accordingly inserted the trial polyethylene acetabular component with a +4  build up.  This was screwed into the acetabulum after removing the apex hole  eliminator.  We trialed several neck lengths on a 36 mm hip ball and felt  that the +5 was perfectly stable.  This essentially increased our length by  about 1 mm compared to the initial construct which was an 8 mm neck length.   Again, we again we had very nice stability with flexion, extension,  abduction, adduction, internal and external rotation.   The trial components were then removed, the joint was copiously irrigated  with saline solution.  A new apex hole eliminator was then inserted.  The  locking ring was inserted out difficulty and the 36 mm auto diameter  acetabular component was then impacted.  The polyethylene component was then  impacted into the metallic acetabulum.   We again trialed several neck lengths and again felt that the +5 with the 36  mm ball was the best construct.  The final 36 mm +5 neck length hip ball was  then impacted after cleaning the Morse taper femoral neck and then reduced  through a full range of motion, we had excellent stability.  I did not seen  any further poly wear synovitis.   The patient was somewhat tight with abduction contracture preoperatively and  to some extent this was released by releasing some of the capsule medially  and we thought that the leg lengths were essentially equal.   The wound was again irrigated with saline solution.  The capsule was closed  anatomically with #1 Ethibond.  I could palpate the sciatic nerve throughout  the procedure posteriorly and felt that it was intact without damage.  The iliotibial band was then closed with a running 0  Vicryl, subcu in several  layers with 0 and 2-0 Vicryl, skin closed with skin clips.  Sterile bulky  dressing was applied.   The patient tolerated the procedure without complications.      Claude Manges. Cleophas Dunker, M.D.  Electronically Signed     PWW/MEDQ  D:  09/24/2005  T:  09/24/2005  Job:  [id number]

## 2010-08-29 NOTE — Discharge Summary (Signed)
[name], [name]                 ACCOUNT NO.:  [id number]   MEDICAL RECORD NO.:  [id number]          PATIENT TYPE:  INP   LOCATION:  5040                         FACILITY:  [facility]   PHYSICIAN:  Claude Manges. Whitfield, M.D.DATE OF BIRTH:  1934/06/23   DATE OF ADMISSION:  09/24/2005  DATE OF DISCHARGE:  09/27/2005                                 DISCHARGE SUMMARY   j   ADMITTING DIAGNOSES:  1. Eccentric __________  of right total hip replacement.  2. Ankylosing spondylitis.  3. Hyperlipidemia.  4. Hypergonadism.  5. History of intestinal blockage.  6. Decreased range of motion, cervical spine.   DISCHARGE DIAGNOSES:  1. Status post revision, right total hip arthroplasty.  2. History of ankylosing spondylitis.  3. Hyperlipidemia.  4. Hypergonadism.  5. History of multiple intestinal blockage requiring surgery most recently      October 2006 that was treated conservatively without treatment.Marland Kitchen   HISTORY OF PRESENT ILLNESS:  [name] is a 76 year old white male who  presents to [name] with history of right total hip replacement done  by him November 1999.  Around 10 to 12 months ago the patient developed  gradual instead of progressively worsening right hip pain.  No known injury  to the right hip.  No further surgery.  Reports that he initially started  feeling like the hip was slightly out of place then going back right into  the socket.  Now the frequency which this recurs is almost daily.  He has  had no complete subluxations in which he required closed reduction.   Pain in the hip is described as intermittent, dull, aching sensation over  the right buttocks trochanter region without radiation.  Pain increases  though with prolonged sitting or standing.  Does have report of having a  feeling as if the hip may dislocate when he bends down to tie his shoes.  Mechanical hip is positive for popping sensation.  The patient does not use  any assistive devices to ambulate.   ALLERGIES:  INDOCIN CAUSES HIVES.   CURRENT MEDICATIONS:  1. AndroGel 1% topically applied daily.  2. Calcium plus Vitamin D 600 mg one tab p.o. b.i.d.  3. Simvastatin 40 mg one tab p.o. q.a.m.  4. Celebrex 200 mg one tab p.o. b.i.d.  5. Tricor 145 mg one tab p.o. q.a.m.  6. Vicodin 5/500 one tab p.o. b.i.d.  7. Foltx 2.5/25/2 mg one tab p.o. q.a.m.  8. Citalopram 40 mg one tab p.o. q.a.m.   SURGICAL PROCEDURE:  The patient was taken to the operating room on September 24, 2005 by Dr. Norlene Campbell, assisted by Dr. Rinaldo Ratel and Jacqualine Code, PA-C.  The patient was placed under general anesthesia and  revision of the acetabular component femoral head of the right total hip  arthroplasty was performed.  The following components were used.  A 36 mm  diameter femoral head in exchange for a 28 mm head with a +5 leg length and  a Duraloc Marathon acetabular aligner +4 with 10 degrees offset posteriorly,  56 mm outside diameter in apex  hole __________  and a Dynamic locking ring  to fit the 56 mm outside diameter acetabular component.  The patient  tolerated the procedure well and returned to recovery in good, stable  condition.   CONSULTS:  The following consults were obtained while the patient was  hospitalized:  PT, OT, case management, rehab, and pharmacy.   HOSPITAL COURSE:  Postop day one the patient afebrile, vital signs stable,  H&H is 12.6, 36.5.  Otherwise the patient denies shortness of breath, chest  pain, abdominal pains.   Postop day two, the patient afebrile, vital signs all stable, H&H 11.8,  34.5.  White count was 7,400.  The patient tolerating diet well.  No  shortness of breath, chest pain, calf pain, nausea, vomiting.   Postop day three the patient with now shortness of breath, chest pain,  nausea, vomiting, T max was 100.1.  Vital signs stable.  H&H stable 11.9,  33.1.  the patient progressing well with physical therapy and was discharged  home later that  day in good and stable condition.  INR date of discharge was  1.3.   LABS:  Routine labs on admission, CBC all values within normal limits.  Coags on admission all values within normal limits.  Routine chemistries on  admission sodium 1.9, potassium 4.2, chloride was 105, bicarb 27, glucose  was elevated at 124, BUN 17, creatinine 1.1, calcium 9.4.   Hepatic enzymes on admission all values within normal limits.  Urinalysis on  admission all values within normal limits.   EKG:  Dated September 21, 2005 showed sinus bradycardia, heart rate of 57 beats  per minute.  Right bundle branch block p.r.n. was 194 msec __________  system was 52, 355.   X-RAY:  Three-view hip status post revision of right total hip showed the  acetabular component processes to be slightly more vertically oriented than  usual.  The intramedullary component was positioned well.   DISCHARGE INSTRUCTIONS:  Meds:  The patient was ready to resume all home  meds except for hydrocodone along with Percocet.  Add Percocet 5/325 one to  two tablets q.4h.-q.6h. for pain.  Coumadin, take as directed by home health  pharmacy.  The patient was __________  Lovenox 40 mg subcutaneous daily.  Start on Monday, September 28, 2005.  Discontinue Lovenox once Coumadin is  therapeutic.  Diet:  No restrictions.  Activity:  The patient is weightbearing as tolerated with a walker.  Wound care:  The patient may shower after two days if there is no drainage  from wound site.  The patient is to keep the wound clean and dry, change  dressing daily, call if temp is greater than 101.5, foul-smelling drainage,  poor pain control.  Followup:  The patient needs to follow up with [name] in one week.  The patient is to call the office at [phone] for an appointment.  Home health:  PT per Laurell Josephs, also __________, first check on September 28, 2005.      Steven [name], P.A.      Claude Manges. Cleophas Dunker, M.D.  Electronically Signed   GC/MEDQ  D:   11/11/2005  T:  11/11/2005  Job:  [id number]

## 2012-04-18 LAB — CBC
HCT: 43.5 % (ref 40.0–52.0)
HGB: 15.4 g/dL (ref 13.0–18.0)
MCH: 33.5 pg (ref 26.0–34.0)
MCHC: 35.4 g/dL (ref 32.0–36.0)
Platelet: 232 10*3/uL (ref 150–440)
WBC: 9.1 10*3/uL (ref 3.8–10.6)

## 2012-04-18 LAB — URINALYSIS, COMPLETE
Bilirubin,UR: NEGATIVE
Glucose,UR: NEGATIVE mg/dL (ref 0–75)
Ketone: NEGATIVE
Nitrite: NEGATIVE
Squamous Epithelial: NONE SEEN
WBC UR: 1 /HPF (ref 0–5)

## 2012-04-19 ENCOUNTER — Inpatient Hospital Stay: Payer: Self-pay | Admitting: Surgery

## 2012-04-19 LAB — COMPREHENSIVE METABOLIC PANEL
Alkaline Phosphatase: 51 U/L (ref 50–136)
Anion Gap: 11 (ref 7–16)
BUN: 14 mg/dL (ref 7–18)
Bilirubin,Total: 0.8 mg/dL (ref 0.2–1.0)
Calcium, Total: 9.1 mg/dL (ref 8.5–10.1)
Chloride: 104 mmol/L (ref 98–107)
EGFR (African American): >60
EGFR (Non-African Amer.): >60
Osmolality: 277 (ref 275–301)
SGPT (ALT): 45 U/L (ref 12–78)
Total Protein: 8 g/dL (ref 6.4–8.2)

## 2012-04-19 LAB — TROPONIN I: Troponin-I: <0.02 ng/mL

## 2012-04-20 LAB — CBC WITH DIFFERENTIAL/PLATELET
Basophil #: 0 10*3/uL (ref 0.0–0.1)
Basophil %: 0.4 %
Eosinophil %: 3.9 %
MCHC: 33.8 g/dL (ref 32.0–36.0)
MCV: 95 fL (ref 80–100)
Monocyte #: 0.6 x10 3/mm (ref 0.2–1.0)
Monocyte %: 12.5 %
Neutrophil #: 3.2 10*3/uL (ref 1.4–6.5)
Neutrophil %: 64.5 %
RBC: 4.05 10*6/uL — ABNORMAL LOW (ref 4.40–5.90)
RDW: 13.2 % (ref 11.5–14.5)

## 2012-04-20 LAB — COMPREHENSIVE METABOLIC PANEL
Co2: 28 mmol/L (ref 21–32)
Creatinine: 0.95 mg/dL (ref 0.60–1.30)
Glucose: 144 mg/dL — ABNORMAL HIGH (ref 65–99)
Osmolality: 284 (ref 275–301)
Potassium: 4.1 mmol/L (ref 3.5–5.1)
SGPT (ALT): 41 U/L (ref 12–78)
Sodium: 141 mmol/L (ref 136–145)

## 2012-04-20 LAB — PROTIME-INR: INR: 1

## 2012-04-20 LAB — AMYLASE: Amylase: 49 U/L (ref 25–115)

## 2012-04-20 LAB — APTT: Activated PTT: 28.1 secs (ref 23.6–35.9)

## 2012-07-14 ENCOUNTER — Ambulatory Visit: Payer: Self-pay | Admitting: Surgery

## 2012-11-14 ENCOUNTER — Emergency Department (HOSPITAL_COMMUNITY)
Admission: EM | Admit: 2012-11-14 | Discharge: 2012-11-14 | Disposition: A | Discharge status: Home / Self Care | Payer: Medicare Other | Attending: Emergency Medicine | Admitting: Emergency Medicine

## 2012-11-14 ENCOUNTER — Encounter (HOSPITAL_COMMUNITY): Payer: Self-pay | Admitting: Emergency Medicine

## 2012-11-14 ENCOUNTER — Emergency Department (HOSPITAL_COMMUNITY): Payer: Medicare Other

## 2012-11-14 DIAGNOSIS — N23 Unspecified renal colic: Secondary | ICD-10-CM | POA: Insufficient documentation

## 2012-11-14 DIAGNOSIS — Z87442 Personal history of urinary calculi: Secondary | ICD-10-CM | POA: Insufficient documentation

## 2012-11-14 DIAGNOSIS — N201 Calculus of ureter: Secondary | ICD-10-CM | POA: Insufficient documentation

## 2012-11-14 DIAGNOSIS — Z79899 Other long term (current) drug therapy: Secondary | ICD-10-CM | POA: Insufficient documentation

## 2012-11-14 MED ORDER — ONDANSETRON HCL 4 MG/2ML IJ SOLN
4.0000 mg | Freq: Once | INTRAMUSCULAR | Status: AC
  Administered 2012-11-14: 4 mg via INTRAVENOUS
  Filled 2012-11-14: qty 2

## 2012-11-14 MED ORDER — TAMSULOSIN HCL 0.4 MG PO CAPS: 0.4000 mg | ORAL_CAPSULE | Freq: Every day | ORAL | Status: DC

## 2012-11-14 MED ORDER — HYDROCODONE-ACETAMINOPHEN 5-325 MG PO TABS: 1.0000 | ORAL_TABLET | Freq: Four times a day (QID) | ORAL | Status: DC | PRN

## 2012-11-14 MED ORDER — HYDROMORPHONE HCL PF 1 MG/ML IJ SOLN
0.5000 mg | Freq: Once | INTRAMUSCULAR | Status: AC
  Administered 2012-11-14: 0.5 mg via INTRAVENOUS
  Filled 2012-11-14: qty 1

## 2012-11-14 NOTE — Progress Notes (Signed)
Patient confirms his pcp is Dr. Thayer Headings of [facility].

## 2012-11-14 NOTE — ED Notes (Signed)
Pt c/o of flank pain. Hx of kidney stones. States that he feels as if he's passing the stone. Pain 6/10. Nausea no vomiting.

## 2012-11-14 NOTE — ED Provider Notes (Signed)
CSN: [id number]     Arrival date & time 11/14/12  1323 History     First MD Initiated Contact with Patient 11/14/12 1354     Chief Complaint  Patient presents with  . Flank Pain   (Consider location/radiation/quality/duration/timing/severity/associated sxs/prior Treatment) Patient is a 78 y.o. male presenting with flank pain. The history is provided by the patient.  Flank Pain Pertinent negatives include no chest pain, no headaches and no shortness of breath.  pt c/o right flank pain posteriorly. Onset this morning. Constant. Dull. Moderate. Non radiating. Feels similar to prior kidney stone pain. No anterior/abd pain. No scrotal or testicular pain. No hematuria or dysuria. No fever or chills. No back injury or strain. w flank pain, denies specific exacerbating or alleviating factors.     Past Medical History  Diagnosis Date  . Kidney calculi    History reviewed. No pertinent past surgical history. History reviewed. No pertinent family history. History  Substance Use Topics  . Smoking status: Never Smoker   . Smokeless tobacco: Not on file  . Alcohol Use: No    Review of Systems  Constitutional: Negative for fever.  HENT: Negative for neck pain.   Eyes: Negative for redness.  Respiratory: Negative for shortness of breath.   Cardiovascular: Negative for chest pain.  Gastrointestinal: Negative for vomiting.  Genitourinary: Positive for flank pain.  Musculoskeletal: Negative for back pain.  Skin: Negative for rash.  Neurological: Negative for headaches.  Hematological: Does not bruise/bleed easily.  Psychiatric/Behavioral: Negative for confusion.    Allergies  Indomethacin  Home Medications   Current Outpatient Rx  Name  Route  Sig  Dispense  Refill  . Calcium Carb-Cholecalciferol (CALCIUM + D3) 600-200 MG-UNIT TABS   Oral   Take 2 tablets by mouth.         . celecoxib (CELEBREX) 100 MG capsule   Oral   Take 150 mg by mouth 2 (two) times daily.         .  cholecalciferol (VITAMIN D) 400 UNITS TABS tablet   Oral   Take 400 Units by mouth.          . citalopram (CELEXA) 20 MG tablet   Oral   Take 20 mg by mouth daily.         . fenofibrate (TRICOR) 145 MG tablet   Oral   Take 145 mg by mouth daily.         . folic acid (FOLVITE) 1 MG tablet   Oral   Take 2.5 mg by mouth daily.         Marland Kitchen HYDROcodone-acetaminophen (VICODIN) 5-500 MG per tablet   Oral   Take 1 tablet by mouth every 6 (six) hours as needed for pain.         . simvastatin (ZOCOR) 40 MG tablet   Oral   Take 40 mg by mouth every evening.          BP 148/71  Pulse 54  Temp(Src) 97.3 F (36.3 C) (Oral)  Resp 16  SpO2 99% Physical Exam  Nursing note and vitals reviewed. Constitutional: He is oriented to person, place, and time. He appears well-developed and well-nourished. No distress.  Eyes: Conjunctivae are normal.  Neck: Neck supple. No tracheal deviation present.  Cardiovascular: Normal rate.   Pulmonary/Chest: Effort normal. No accessory muscle usage. No respiratory distress.  Abdominal: Soft. He exhibits no distension and no mass. There is no tenderness. There is no rebound and no guarding.  Genitourinary:  No  cva tenderness  Musculoskeletal: Normal range of motion.  Neurological: He is alert and oriented to person, place, and time.  Skin: Skin is warm and dry. No rash noted.  No rash/shingles in area of pain  Psychiatric: He has a normal mood and affect.    ED Course   Procedures (including critical care time)  Results for orders placed during the hospital encounter of 08/11/10  DIFFERENTIAL      Result Value Range   Neutrophils Relative % 87 (*) 43 - 77 %   Neutro Abs 11.6 (*) 1.7 - 7.7 K/uL   Lymphocytes Relative 6 (*) 12 - 46 %   Lymphs Abs 0.8  0.7 - 4.0 K/uL   Monocytes Relative 7  3 - 12 %   Monocytes Absolute 0.9  0.1 - 1.0 K/uL   Eosinophils Relative 1  0 - 5 %   Eosinophils Absolute 0.1  0.0 - 0.7 K/uL   Basophils Relative  0  0 - 1 %   Basophils Absolute 0.0  0.0 - 0.1 K/uL  CBC      Result Value Range   WBC 13.4 (*) 4.0 - 10.5 K/uL   RBC 5.04  4.22 - 5.81 MIL/uL   Hemoglobin 16.1  13.0 - 17.0 g/dL   HCT 16.1  09.6 - 04.5 %   MCV 92.1  78.0 - 100.0 fL   MCH 31.9  26.0 - 34.0 pg   MCHC 34.7  30.0 - 36.0 g/dL   RDW 40.9  81.1 - 91.4 %   Platelets 269  150 - 400 K/uL  COMPREHENSIVE METABOLIC PANEL      Result Value Range   Sodium 139  135 - 145 mEq/L   Potassium 4.3  3.5 - 5.1 mEq/L   Chloride 101  96 - 112 mEq/L   CO2 26  19 - 32 mEq/L   Glucose, Bld 142 (*) 70 - 99 mg/dL   BUN 18  6 - 23 mg/dL   Creatinine, Ser 7.82  0.4 - 1.5 mg/dL   Calcium 9.6  8.4 - 95.6 mg/dL   Total Protein 7.9  6.0 - 8.3 g/dL   Albumin 4.2  3.5 - 5.2 g/dL   AST 37  0 - 37 U/L   ALT 37  0 - 53 U/L   Alkaline Phosphatase 49  39 - 117 U/L   Total Bilirubin 0.8  0.3 - 1.2 mg/dL   GFR calc non Af Amer >60  >60 mL/min   GFR calc Af Amer    >60 mL/min   Value: >60            The eGFR has been calculated     using the MDRD equation.     This calculation has not been     validated in all clinical     situations.     eGFR's persistently     <60 mL/min signify     possible Chronic Kidney Disease.  URINALYSIS, ROUTINE W REFLEX MICROSCOPIC      Result Value Range   Color, Urine YELLOW  YELLOW   APPearance CLEAR  CLEAR   Specific Gravity, Urine 1.022  1.005 - 1.030   pH 6.5  5.0 - 8.0   Glucose, UA NEGATIVE  NEGATIVE mg/dL   Hgb urine dipstick NEGATIVE  NEGATIVE   Bilirubin Urine NEGATIVE  NEGATIVE   Ketones, ur NEGATIVE  NEGATIVE mg/dL   Protein, ur NEGATIVE  NEGATIVE mg/dL   Urobilinogen, UA 0.2  0.0 -  1.0 mg/dL   Nitrite NEGATIVE  NEGATIVE   Leukocytes, UA    NEGATIVE   Value: NEGATIVE MICROSCOPIC NOT DONE ON URINES WITH NEGATIVE PROTEIN, BLOOD, LEUKOCYTES, NITRITE, OR GLUCOSE <1000 mg/dL.  AMYLASE      Result Value Range   Amylase 68  0 - 105 U/L  LIPASE, BLOOD      Result Value Range   Lipase 29  11 - 59 U/L   HEMOGLOBIN AND HEMATOCRIT, BLOOD      Result Value Range   Hemoglobin 14.7  13.0 - 17.0 g/dL   HCT 16.1  09.6 - 04.5 %  CBC      Result Value Range   WBC 11.0 (*) 4.0 - 10.5 K/uL   RBC 4.70  4.22 - 5.81 MIL/uL   Hemoglobin 14.7  13.0 - 17.0 g/dL   HCT 40.9  81.1 - 91.4 %   MCV 93.2  78.0 - 100.0 fL   MCH 31.3  26.0 - 34.0 pg   MCHC 33.6  30.0 - 36.0 g/dL   RDW 78.2  95.6 - 21.3 %   Platelets 253  150 - 400 K/uL  BASIC METABOLIC PANEL      Result Value Range   Sodium 138  135 - 145 mEq/L   Potassium 4.1  3.5 - 5.1 mEq/L   Chloride 103  96 - 112 mEq/L   CO2 28  19 - 32 mEq/L   Glucose, Bld 116 (*) 70 - 99 mg/dL   BUN 9  6 - 23 mg/dL   Creatinine, Ser 0.86  0.4 - 1.5 mg/dL   Calcium 8.7  8.4 - 57.8 mg/dL   GFR calc non Af Amer >60  >60 mL/min   GFR calc Af Amer    >60 mL/min   Value: >60            The eGFR has been calculated     using the MDRD equation.     This calculation has not been     validated in all clinical     situations.     eGFR's persistently     <60 mL/min signify     possible Chronic Kidney Disease.   Ct Abdomen Pelvis Wo Contrast  11/14/2012   *RADIOLOGY REPORT*  Clinical Data: Right flank pain, history of kidney stones, rule out ureteral stone  CT ABDOMEN AND PELVIS WITHOUT CONTRAST  Technique:  Multidetector CT imaging of the abdomen and pelvis was performed following the standard protocol without intravenous contrast.  Comparison: CT scan 08/11/2010  Findings: Lung bases are unremarkable.  Sagittal images of the spine shows no destructive bony lesions.  Multilevel anterior bridging osteophytes and calcification of the anterior longitudinal ligament.  Diffuse osteopenia noted.  Lung bases are unremarkable.  Unenhanced liver shows no biliary ductal dilatation.  Unenhanced pancreas, spleen and adrenal glands are unremarkable.  At least four nonobstructive calcifications are noted within the right kidney the largest in upper pole medially measures 5.4 mm.  At  least four or five calcified nonobstructive calculi are noted within left kidney the largest in upper pole measures 3.7 mm. There is mild right hydronephrosis and right hydroureter.  Mild right perinephric and periureteral stranding. Atherosclerotic calcifications are noted abdominal aorta and the iliac arteries.  No aortic aneurysm.  Bilateral no proximal or mid calcified ureteral calculi are noted.  No small bowel obstruction.  There are postsurgical changes noted within the small bowel and colon in the left lower quadrant of the abdomen.  Scattered colonic diverticula are noted without evidence of acute diverticulitis.  Mild distention of the anastomosed to the small bowel axial image 43 left lower quadrant without evidence of small bowel obstruction.  Evaluation of the pelvis is limited by metallic artifacts from right hip prosthesis.  There is a calcified calculus in the right UVJ region measures 4 mm.  IMPRESSION:  1.  There is bilateral nonobstructive nephrolithiasis.  The largest calculus in the upper pole of the right kidney measures 5.4 mm. Largest nonobstructive calculus in upper pole of the left kidney measures 3.7 mm. 2.  There is mild right hydronephrosis and right hydroureter.  Mild right perinephric stranding.  No proximal ureteral calculi are noted bilaterally. 3.  There is a 4 mm calcified calculus in the right UVJ/urinary bladder wall.  The examination is limited by metallic artifacts from right hip prosthesis. 3.  Scattered colonic diverticula.  No evidence of acute diverticulitis.  Postsurgical changes are noted within colon and distal small bowel and left lower abdomen.  Mild distention of the surgical site small bowel lumen without evidence of small bowel obstruction or surrounding inflammation. 4.  Probable ankylosing spondylitis lumbar spine.   Original Report Authenticated By: Natasha Mead, M.D.      MDM  Iv ns. Labs. Ct.  Reviewed nursing notes and prior charts for additional history.    Dilaudid .5 mg iv. zofran iv.  Recheck pt comfortable.  abd soft nt.  Discussed ct w pt.  Pt afeb. No dysuria, no abd tenderness. pt appears stable for d/c.     Suzi Roots, MD 11/14/12 1500

## 2013-01-18 ENCOUNTER — Other Ambulatory Visit: Payer: Self-pay | Admitting: Rheumatology

## 2013-01-18 DIAGNOSIS — M25512 Pain in left shoulder: Secondary | ICD-10-CM

## 2013-01-22 ENCOUNTER — Ambulatory Visit
Admission: RE | Admit: 2013-01-22 | Discharge: 2013-01-22 | Disposition: A | Discharge status: Home / Self Care | Payer: Medicare Other | Source: Ambulatory Visit | Attending: Rheumatology | Admitting: Rheumatology

## 2013-01-22 DIAGNOSIS — M25512 Pain in left shoulder: Secondary | ICD-10-CM

## 2013-02-21 ENCOUNTER — Encounter (HOSPITAL_COMMUNITY): Payer: Self-pay | Admitting: Pharmacy Technician

## 2013-02-22 NOTE — Pre-Procedure Instructions (Signed)
JAWANZA ZAMBITO  02/22/2013   Your procedure is scheduled on:  03/02/13  Report to [facility]  2 * 3 at 10 AM.  Call this number if you have problems the morning of surgery: [phone]   Remember:   Do not eat food or drink liquids after midnight.   Take these medicines the morning of surgery with A SIP OF WATER: celexa,pain medication   Do not wear jewelry, make-up or nail polish.  Do not wear lotions, powders, or perfumes. You may wear deodorant.  Do not shave 48 hours prior to surgery. Men may shave face and neck.  Do not bring valuables to the hospital.  [facility] is not responsible                  for any belongings or valuables.               Contacts, dentures or bridgework may not be worn into surgery.  Leave suitcase in the car. After surgery it may be brought to your room.  For patients admitted to the hospital, discharge time is determined by your                treatment team.               Patients discharged the day of surgery will not be allowed to drive  home.  Name and phone number of your driver: family  Special Instructions: Shower using CHG 2 nights before surgery and the night before surgery.  If you shower the day of surgery use CHG.  Use special wash - you have one bottle of CHG for all showers.  You should use approximately 1/3 of the bottle for each shower.   Please read over the following fact sheets that you were given: Pain Booklet, Coughing and Deep Breathing and Surgical Site Infection Prevention

## 2013-02-23 ENCOUNTER — Encounter (HOSPITAL_COMMUNITY)
Admission: RE | Admit: 2013-02-23 | Discharge: 2013-02-23 | Disposition: A | Discharge status: Home / Self Care | Payer: Medicare Other | Source: Ambulatory Visit | Attending: Orthopedic Surgery | Admitting: Orthopedic Surgery

## 2013-02-23 ENCOUNTER — Encounter (HOSPITAL_COMMUNITY): Payer: Self-pay

## 2013-02-23 DIAGNOSIS — Z01818 Encounter for other preprocedural examination: Secondary | ICD-10-CM | POA: Insufficient documentation

## 2013-02-23 DIAGNOSIS — Z01812 Encounter for preprocedural laboratory examination: Secondary | ICD-10-CM | POA: Insufficient documentation

## 2013-02-23 HISTORY — DX: Unspecified osteoarthritis, unspecified site: M19.90

## 2013-02-23 HISTORY — DX: Anxiety disorder, unspecified: F41.9

## 2013-02-23 LAB — BASIC METABOLIC PANEL
BUN: 14 mg/dL (ref 6–23)
Calcium: 9.6 mg/dL (ref 8.4–10.5)
Creatinine, Ser: 0.83 mg/dL (ref 0.50–1.35)
GFR calc non Af Amer: 83 mL/min — ABNORMAL LOW (ref >90)
Glucose, Bld: 124 mg/dL — ABNORMAL HIGH (ref 70–99)
Potassium: 4.4 mEq/L (ref 3.5–5.1)

## 2013-02-23 LAB — CBC
HCT: 37.3 % — ABNORMAL LOW (ref 39.0–52.0)
Hemoglobin: 13.1 g/dL (ref 13.0–17.0)
MCV: 93.3 fL (ref 78.0–100.0)
RBC: 4 MIL/uL — ABNORMAL LOW (ref 4.22–5.81)
WBC: 7.7 10*3/uL (ref 4.0–10.5)

## 2013-02-23 NOTE — Progress Notes (Signed)
Pt also takes weekly allergy injections at [facility] office.

## 2013-03-01 MED ORDER — CEFAZOLIN SODIUM-DEXTROSE 2-3 GM-% IV SOLR
2.0000 g | INTRAVENOUS | Status: AC
  Administered 2013-03-02: 2 g via INTRAVENOUS
  Filled 2013-03-01: qty 50

## 2013-03-01 NOTE — Progress Notes (Addendum)
Anesthesia Notation: Apparently T&S order entered at patient's PAT appointment. Received a call from [facility], PA-C with Dr. Rennis Chris.  She states that T&S not needed for this procedure.  Will discontinue order (he was for redraw T&S due to antibodies).    Velna Ochs [facility]/Anesthesiology Phone [phone] 03/01/2013 12:19 PM

## 2013-03-02 ENCOUNTER — Ambulatory Visit (HOSPITAL_COMMUNITY): Payer: Medicare Other | Admitting: Vascular Surgery

## 2013-03-02 ENCOUNTER — Encounter (HOSPITAL_COMMUNITY): Payer: Medicare Other | Admitting: Vascular Surgery

## 2013-03-02 ENCOUNTER — Encounter (HOSPITAL_COMMUNITY)
Admission: RE | Disposition: A | Discharge status: Home / Self Care | Payer: Self-pay | Source: Ambulatory Visit | Attending: Orthopedic Surgery

## 2013-03-02 ENCOUNTER — Ambulatory Visit (HOSPITAL_COMMUNITY)
Admission: RE | Admit: 2013-03-02 | Discharge: 2013-03-02 | Disposition: A | Discharge status: Home / Self Care | Payer: Medicare Other | Source: Ambulatory Visit | Attending: Orthopedic Surgery | Admitting: Orthopedic Surgery

## 2013-03-02 ENCOUNTER — Encounter (HOSPITAL_COMMUNITY): Payer: Self-pay

## 2013-03-02 DIAGNOSIS — M25819 Other specified joint disorders, unspecified shoulder: Secondary | ICD-10-CM | POA: Insufficient documentation

## 2013-03-02 DIAGNOSIS — M19019 Primary osteoarthritis, unspecified shoulder: Secondary | ICD-10-CM | POA: Insufficient documentation

## 2013-03-02 DIAGNOSIS — Z96649 Presence of unspecified artificial hip joint: Secondary | ICD-10-CM | POA: Insufficient documentation

## 2013-03-02 DIAGNOSIS — Z87891 Personal history of nicotine dependence: Secondary | ICD-10-CM | POA: Insufficient documentation

## 2013-03-02 DIAGNOSIS — M75120 Complete rotator cuff tear or rupture of unspecified shoulder, not specified as traumatic: Secondary | ICD-10-CM | POA: Insufficient documentation

## 2013-03-02 HISTORY — PX: SHOULDER ARTHROSCOPY WITH ROTATOR CUFF REPAIR AND SUBACROMIAL DECOMPRESSION: SHX5686

## 2013-03-02 SURGERY — SHOULDER ARTHROSCOPY WITH ROTATOR CUFF REPAIR AND SUBACROMIAL DECOMPRESSION
Anesthesia: General | Site: Shoulder | Laterality: Left | Wound class: Clean

## 2013-03-02 MED ORDER — LIDOCAINE HCL (CARDIAC) 20 MG/ML IV SOLN
INTRAVENOUS | Status: DC | PRN
  Administered 2013-03-02: 80 mg via INTRAVENOUS

## 2013-03-02 MED ORDER — ONDANSETRON HCL 4 MG/2ML IJ SOLN: 4.0000 mg | Freq: Once | INTRAMUSCULAR | Status: DC | PRN

## 2013-03-02 MED ORDER — CHLORHEXIDINE GLUCONATE 4 % EX LIQD: 60.0000 mL | Freq: Once | CUTANEOUS | Status: DC

## 2013-03-02 MED ORDER — SUCCINYLCHOLINE CHLORIDE 20 MG/ML IJ SOLN
INTRAMUSCULAR | Status: DC | PRN
  Administered 2013-03-02: 100 mg via INTRAVENOUS

## 2013-03-02 MED ORDER — OXYCODONE-ACETAMINOPHEN 5-325 MG PO TABS: 1.0000 | ORAL_TABLET | ORAL | Status: DC | PRN

## 2013-03-02 MED ORDER — FENTANYL CITRATE 0.05 MG/ML IJ SOLN
INTRAMUSCULAR | Status: DC | PRN
  Administered 2013-03-02: 75 ug via INTRAVENOUS

## 2013-03-02 MED ORDER — ROCURONIUM BROMIDE 100 MG/10ML IV SOLN
INTRAVENOUS | Status: DC | PRN
  Administered 2013-03-02: 30 mg via INTRAVENOUS

## 2013-03-02 MED ORDER — LACTATED RINGERS IV SOLN
INTRAVENOUS | Status: DC
  Administered 2013-03-02 (×2): via INTRAVENOUS

## 2013-03-02 MED ORDER — GLYCOPYRROLATE 0.2 MG/ML IJ SOLN
INTRAMUSCULAR | Status: DC | PRN
  Administered 2013-03-02: 0.4 mg via INTRAVENOUS

## 2013-03-02 MED ORDER — SODIUM CHLORIDE 0.9 % IR SOLN
Status: DC | PRN
  Administered 2013-03-02 (×6): 1000 mL
  Administered 2013-03-02: 3000 mL
  Administered 2013-03-02 (×5): 1000 mL

## 2013-03-02 MED ORDER — FENTANYL CITRATE 0.05 MG/ML IJ SOLN
INTRAMUSCULAR | Status: AC
  Filled 2013-03-02: qty 2

## 2013-03-02 MED ORDER — HYDROMORPHONE HCL PF 1 MG/ML IJ SOLN: 0.2500 mg | INTRAMUSCULAR | Status: DC | PRN

## 2013-03-02 MED ORDER — ONDANSETRON HCL 4 MG/2ML IJ SOLN
INTRAMUSCULAR | Status: DC | PRN
  Administered 2013-03-02: 4 mg via INTRAVENOUS

## 2013-03-02 MED ORDER — NEOSTIGMINE METHYLSULFATE 1 MG/ML IJ SOLN
INTRAMUSCULAR | Status: DC | PRN
  Administered 2013-03-02: 3 mg via INTRAVENOUS

## 2013-03-02 MED ORDER — SODIUM CHLORIDE 0.9 % IV SOLN
10.0000 mg | INTRAVENOUS | Status: DC | PRN
  Administered 2013-03-02: 15 ug/min via INTRAVENOUS

## 2013-03-02 MED ORDER — TEMAZEPAM 30 MG PO CAPS: 30.0000 mg | ORAL_CAPSULE | Freq: Every evening | ORAL | Status: DC | PRN

## 2013-03-02 MED ORDER — MIDAZOLAM HCL 2 MG/2ML IJ SOLN
INTRAMUSCULAR | Status: AC
  Filled 2013-03-02: qty 2

## 2013-03-02 MED ORDER — PROPOFOL 10 MG/ML IV BOLUS
INTRAVENOUS | Status: DC | PRN
  Administered 2013-03-02: 200 mg via INTRAVENOUS

## 2013-03-02 MED ORDER — FENTANYL CITRATE 0.05 MG/ML IJ SOLN
INTRAMUSCULAR | Status: AC
  Administered 2013-03-02: 100 ug
  Filled 2013-03-02: qty 2

## 2013-03-02 MED ORDER — DIAZEPAM 2 MG PO TABS: 2.0000 mg | ORAL_TABLET | Freq: Four times a day (QID) | ORAL | Status: DC | PRN

## 2013-03-02 MED ORDER — PHENYLEPHRINE HCL 10 MG/ML IJ SOLN
INTRAMUSCULAR | Status: DC | PRN
  Administered 2013-03-02: 80 ug via INTRAVENOUS
  Administered 2013-03-02 (×2): 120 ug via INTRAVENOUS

## 2013-03-02 SURGICAL SUPPLY — 63 items
ANCH SUT 2 FT CRKSW 14.7X5.5 (Anchor) ×1 IMPLANT
ANCH SUT SWLK 19.1X4.75 VT (Anchor) ×2 IMPLANT
ANCHOR CORKSCREW FIBER 5.5X15 (Anchor) ×1 IMPLANT
ANCHOR PEEK 4.75X19.1 SWLK C (Anchor) ×2 IMPLANT
BLADE CUTTER GATOR 3.5 (BLADE) ×2 IMPLANT
BLADE GREAT WHITE 4.2 (BLADE) ×2 IMPLANT
BLADE SURG 11 STRL SS (BLADE) ×2 IMPLANT
BOOTCOVER CLEANROOM LRG (PROTECTIVE WEAR) ×4 IMPLANT
BUR 3.5 LG SPHERICAL (BURR) IMPLANT
BUR OVAL 4.0 (BURR) ×2 IMPLANT
BURR 3.5 LG SPHERICAL (BURR)
CANISTER SUCT LVC 12 LTR MEDI- (MISCELLANEOUS) ×2 IMPLANT
CANNULA ACUFLEX KIT 5X76 (CANNULA) ×2 IMPLANT
CANNULA DRILOCK 5.0X75 (CANNULA) ×3 IMPLANT
CLOTH BEACON ORANGE TIMEOUT ST (SAFETY) ×2 IMPLANT
CLSR STERI-STRIP ANTIMIC 1/2X4 (GAUZE/BANDAGES/DRESSINGS) ×1 IMPLANT
CONNECTOR 5 IN 1 STRAIGHT STRL (MISCELLANEOUS) ×2 IMPLANT
DRAPE INCISE 23X17 IOBAN STRL (DRAPES) ×1
DRAPE INCISE 23X17 STRL (DRAPES) ×1 IMPLANT
DRAPE INCISE IOBAN 23X17 STRL (DRAPES) ×1 IMPLANT
DRAPE INCISE IOBAN 66X45 STRL (DRAPES) ×2 IMPLANT
DRAPE STERI 35X30 U-POUCH (DRAPES) ×2 IMPLANT
DRAPE SURG 17X11 SM STRL (DRAPES) ×2 IMPLANT
DRAPE U-SHAPE 47X51 STRL (DRAPES) ×2 IMPLANT
DRSG PAD ABDOMINAL 8X10 ST (GAUZE/BANDAGES/DRESSINGS) ×3 IMPLANT
DURAPREP 26ML APPLICATOR (WOUND CARE) ×4 IMPLANT
ELECT REM PT RETURN 9FT ADLT (ELECTROSURGICAL) ×2
ELECTRODE REM PT RTRN 9FT ADLT (ELECTROSURGICAL) ×1 IMPLANT
GLOVE BIO SURGEON STRL SZ7.5 (GLOVE) ×2 IMPLANT
GLOVE BIO SURGEON STRL SZ8 (GLOVE) ×2 IMPLANT
GLOVE EUDERMIC 7 POWDERFREE (GLOVE) ×2 IMPLANT
GLOVE SS BIOGEL STRL SZ 7.5 (GLOVE) ×1 IMPLANT
GLOVE SUPERSENSE BIOGEL SZ 7.5 (GLOVE) ×1
GOWN STRL NON-REIN LRG LVL3 (GOWN DISPOSABLE) ×2 IMPLANT
GOWN STRL REIN XL XLG (GOWN DISPOSABLE) ×8 IMPLANT
KIT BASIN OR (CUSTOM PROCEDURE TRAY) ×2 IMPLANT
KIT ROOM TURNOVER OR (KITS) ×2 IMPLANT
KIT SHOULDER TRACTION (DRAPES) ×2 IMPLANT
MANIFOLD NEPTUNE II (INSTRUMENTS) ×2 IMPLANT
NDL SPNL 18GX3.5 QUINCKE PK (NEEDLE) ×1 IMPLANT
NDL SUT 6 .5 CRC .975X.05 MAYO (NEEDLE) IMPLANT
NEEDLE MAYO TAPER (NEEDLE)
NEEDLE SPNL 18GX3.5 QUINCKE PK (NEEDLE) ×2 IMPLANT
NS IRRIG 1000ML POUR BTL (IV SOLUTION) ×2 IMPLANT
PACK SHOULDER (CUSTOM PROCEDURE TRAY) ×2 IMPLANT
PAD ARMBOARD 7.5X6 YLW CONV (MISCELLANEOUS) ×4 IMPLANT
SET ARTHROSCOPY TUBING (MISCELLANEOUS) ×2
SET ARTHROSCOPY TUBING LN (MISCELLANEOUS) ×1 IMPLANT
SLING ARM FOAM STRAP LRG (SOFTGOODS) IMPLANT
SLING ARM FOAM STRAP MED (SOFTGOODS) ×2 IMPLANT
SLING SWATHE LARGE (SOFTGOODS) ×1 IMPLANT
SPONGE GAUZE 4X4 12PLY (GAUZE/BANDAGES/DRESSINGS) ×2 IMPLANT
SPONGE LAP 4X18 X RAY DECT (DISPOSABLE) ×2 IMPLANT
STRIP CLOSURE SKIN 1/2X4 (GAUZE/BANDAGES/DRESSINGS) ×2 IMPLANT
SUT MNCRL AB 3-0 PS2 18 (SUTURE) ×2 IMPLANT
SUT PDS AB 0 CT 36 (SUTURE) IMPLANT
SUT RETRIEVER GRASP 30 DEG (SUTURE) ×1 IMPLANT
SYR 20CC LL (SYRINGE) ×2 IMPLANT
TAPE PAPER 3X10 WHT MICROPORE (GAUZE/BANDAGES/DRESSINGS) ×2 IMPLANT
TOWEL OR 17X24 6PK STRL BLUE (TOWEL DISPOSABLE) ×2 IMPLANT
TOWEL OR 17X26 10 PK STRL BLUE (TOWEL DISPOSABLE) ×2 IMPLANT
WAND SUCTION MAX 4MM 90S (SURGICAL WAND) ×2 IMPLANT
WATER STERILE IRR 1000ML POUR (IV SOLUTION) ×2 IMPLANT

## 2013-03-02 NOTE — Transfer of Care (Signed)
Immediate Anesthesia Transfer of Care Note  Patient: Steven [name]  Procedure(s) Performed: Procedure(s): LEFT SHOULDER ARTHROSCOPY WITH SUBACROMIAL DECOMPRESSION DISTAL CALVICLE RESECTION AND ROTATOR CUFF REPAIR  (Left)  Patient Location: PACU  Anesthesia Type:General  Level of Consciousness: awake, alert  and oriented  Airway & Oxygen Therapy: Patient Spontanous Breathing and Patient connected to nasal cannula oxygen  Post-op Assessment: Report given to PACU RN, Post -op Vital signs reviewed and stable and Patient moving all extremities  Post vital signs: Reviewed and stable  Complications: No apparent anesthesia complications

## 2013-03-02 NOTE — Preoperative (Signed)
Beta Blockers   Reason not to administer Beta Blockers:Not Applicable 

## 2013-03-02 NOTE — Anesthesia Procedure Notes (Addendum)
Anesthesia Regional Block:  Interscalene brachial plexus block  Pre-Anesthetic Checklist: ,, timeout performed, Correct Patient, Correct Site, Correct Laterality, Correct Procedure, Correct Position, site marked, Risks and benefits discussed,  Surgical consent,  Pre-op evaluation,  At surgeon's request and post-op pain management   Prep: chloraprep and alcohol swabs       Needles:  Injection technique: Single-shot  Needle Type: Stimulator Needle - 40        Needle insertion depth: 2 cm   Additional Needles:  Procedures: nerve stimulator Interscalene brachial plexus block  Nerve Stimulator or Paresthesia:  Response: 0.5 mA, 0.1 ms, 2 cm  Additional Responses:   Narrative:  Start time: 03/02/2013 11:35 AM End time: 03/02/2013 11:40 AM Injection made incrementally with aspirations every 5 mL. Anesthesiologist: Maren Beach MD  Additional Notes: Pt accepts procedure w/ risks. 16cc 0.5% Marcaine w/ epi w/o dicomfort or difficulty. GES   Procedure Name: Intubation Date/Time: 03/02/2013 12:16 PM Performed by: Marena Chancy Pre-anesthesia Checklist: Patient identified, Patient being monitored, Emergency Drugs available, Timeout performed and Suction available Patient Re-evaluated:Patient Re-evaluated prior to inductionOxygen Delivery Method: Circle system utilized Preoxygenation: Pre-oxygenation with 100% oxygen Intubation Type: IV induction Ventilation: Mask ventilation without difficulty Laryngoscope Size: Mac and 3 Grade View: Grade IV Tube type: Oral Tube size: 7.5 mm Number of attempts: 2 Airway Equipment and Method: Bougie stylet and Patient positioned with wedge pillow Placement Confirmation: breath sounds checked- equal and bilateral and positive ETCO2 Secured at: 23 cm Tube secured with: Tape Dental Injury: Teeth and Oropharynx as per pre-operative assessment  Difficulty Due To: Difficulty was anticipated, Difficult Airway- due to reduced neck mobility  and Difficult Airway- due to dentition Future Recommendations: Recommend- induction with short-acting agent, and alternative techniques readily available

## 2013-03-02 NOTE — Anesthesia Preprocedure Evaluation (Addendum)
Anesthesia Evaluation  Patient identified by MRN, date of birth, ID band Patient awake    Reviewed: Allergy & Precautions, H&P , NPO status , Patient's Chart, lab work & pertinent test results  Airway Mallampati: II      Dental  (+) Teeth Intact   Pulmonary former smoker,          Cardiovascular     Neuro/Psych Anxiety    GI/Hepatic   Endo/Other    Renal/GU Renal disease     Musculoskeletal   Abdominal   Peds  Hematology   Anesthesia Other Findings   Reproductive/Obstetrics                          Anesthesia Physical Anesthesia Plan  ASA: I  Anesthesia Plan: General   Post-op Pain Management:    Induction: Intravenous  Airway Management Planned: Oral ETT  Additional Equipment:   Intra-op Plan:   Post-operative Plan: Extubation in OR  Informed Consent: I have reviewed the patients History and Physical, chart, labs and discussed the procedure including the risks, benefits and alternatives for the proposed anesthesia with the patient or authorized representative who has indicated his/her understanding and acceptance.     Plan Discussed with: CRNA, Anesthesiologist and Surgeon  Anesthesia Plan Comments:         Anesthesia Quick Evaluation

## 2013-03-02 NOTE — Progress Notes (Signed)
Patient taken to room air, tol well, O2 sat = 97

## 2013-03-02 NOTE — Op Note (Signed)
03/02/2013  2:01 PM  PATIENT:   Steven [name]  78 y.o. male  PRE-OPERATIVE DIAGNOSIS:  LEFT SHOULDER IMPINGEMENT, AC JOINT OA,ROTATOR CUFF TEAR   POST-OPERATIVE DIAGNOSIS:  same  PROCEDURE:  LSA, SAD, DCR, RCR  SURGEON:  Kurtiss Wence, Vania Rea. M.D.  ASSISTANTS: Shuford pac   ANESTHESIA:   GET + ISB  EBL: min  SPECIMEN:  none  Drains: none   PATIENT DISPOSITION:  PACU - hemodynamically stable.    PLAN OF CARE: Discharge to home after PACU  Dictation# [id number]

## 2013-03-02 NOTE — H&P (Signed)
Steven [name]    Chief Complaint: LEFT SHOULDER ROTATOR CUFF TEAR  HPI: The patient is a 78 y.o. male with chronic left shoulder impingement and MRI evidence for rotator cuff tear  Past Medical History  Diagnosis Date  . Kidney calculi   . Anxiety   . Mental disorder   . Arthritis     ankylosing spodilitis    Past Surgical History  Procedure Laterality Date  . Abdominal surgery      had abcess  . Joint replacement      hip    No family history on file.  Social History:  reports that he has quit smoking. He does not have any smokeless tobacco history on file. He reports that he drinks alcohol. He reports that he does not use illicit drugs.  Allergies:  Allergies  Allergen Reactions  . Indomethacin Hives    Medications Prior to Admission  Medication Sig Dispense Refill  . Calcium Carbonate-Vitamin D (CALCIUM 600+D) 600-400 MG-UNIT per tablet Take 1 tablet by mouth 2 (two) times daily.      . cholecalciferol (VITAMIN D) 400 UNITS TABS tablet Take 400 Units by mouth daily.      . citalopram (CELEXA) 40 MG tablet Take 40 mg by mouth daily.      . folic acid-pyridoxine-cyancobalamin (FOLBIC) 2.5-25-2 MG TABS Take 1 tablet by mouth daily.      Marland Kitchen HYDROcodone-acetaminophen (NORCO/VICODIN) 5-325 MG per tablet Take 1 tablet by mouth at bedtime as needed for moderate pain.      . meloxicam (MOBIC) 7.5 MG tablet Take 7.5 mg by mouth 2 (two) times daily.      . Multiple Vitamins-Minerals (PRESERVISION AREDS 2 PO) Take 1 tablet by mouth 2 (two) times daily.      . simvastatin (ZOCOR) 40 MG tablet Take 40 mg by mouth daily.      . solifenacin (VESICARE) 5 MG tablet Take 5 mg by mouth daily.      . temazepam (RESTORIL) 30 MG capsule Take 30 mg by mouth at bedtime as needed for sleep.         Physical Exam: Left shoulder with painful and restricted motion ss noted at recent office visits  Vitals  Temp:  [97.2 F (36.2 C)] 97.2 F (36.2 C) (11/20 1016) Pulse Rate:  [63-64] 63  (11/20 1038) Resp:  [17-20] 17 (11/20 1038) BP: (134-167)/(69-80) 134/69 mmHg (11/20 1038) SpO2:  [96 %-99 %] 99 % (11/20 1038)  Assessment/Plan  Impression: LEFT SHOULDER ROTATOR CUFF TEAR   Plan of Action: Procedure(s): LEFT SHOULDER ARTHROSCOPY WITH SUBACROMIAL DECOMPRESSION DISTAL CALVICLE RESECTION AND ROTATOR CUFF REPAIR   Steven [name] 03/02/2013, 11:35 AM

## 2013-03-02 NOTE — Anesthesia Postprocedure Evaluation (Signed)
  Anesthesia Post-op Note  Patient: Steven [name]  Procedure(s) Performed: Procedure(s): LEFT SHOULDER ARTHROSCOPY WITH SUBACROMIAL DECOMPRESSION DISTAL CALVICLE RESECTION AND ROTATOR CUFF REPAIR  (Left)  Patient Location: PACU  Anesthesia Type:GA combined with regional for post-op pain  Level of Consciousness: awake, alert , oriented and patient cooperative  Airway and Oxygen Therapy: Patient Spontanous Breathing  Post-op Pain: none  Post-op Assessment: Post-op Vital signs reviewed, Patient's Cardiovascular Status Stable, Respiratory Function Stable, Patent Airway, No signs of Nausea or vomiting and Pain level controlled  Post-op Vital Signs: stable  Complications: No apparent anesthesia complications

## 2013-03-03 ENCOUNTER — Encounter (HOSPITAL_COMMUNITY): Payer: Self-pay | Admitting: Orthopedic Surgery

## 2013-03-03 LAB — TYPE AND SCREEN
ABO/RH(D): A POS
Antibody Screen: POSITIVE
DAT, IgG: NEGATIVE
Donor AG Type: NEGATIVE
Unit division: 0

## 2013-03-03 NOTE — Op Note (Signed)
Steven [name], Steven [name]                 ACCOUNT NO.:  [id number]  MEDICAL RECORD NO.:  [id number]  LOCATION:  [facility]                         FACILITY:  [facility]  PHYSICIAN:  Vania Rea. Jordie Schreur, M.D.  DATE OF BIRTH:  07/10/1934  DATE OF PROCEDURE:  03/02/2013 DATE OF DISCHARGE:  03/02/2013                              OPERATIVE REPORT   PREOPERATIVE DIAGNOSES: 1. Chronic left shoulder pain with impingement syndrome. 2. Left shoulder full-thickness rotator cuff tear. 3. Left shoulder acromioclavicular joint arthropathy. 4. Early glenohumeral osteoarthritis.  POSTOPERATIVE DIAGNOSES: 1. Chronic left shoulder pain with impingement syndrome. 2. Left shoulder full-thickness rotator cuff tear. 3. Left shoulder acromioclavicular joint arthropathy. 4. Early glenohumeral osteoarthritis.  PROCEDURES: 1. Left shoulder examination under anesthesia. 2. Left shoulder glenohumeral joint diagnostic arthroscopy. 3. Arthroscopic subacromial decompression and bursectomy. 4. Arthroscopic distal clavicle resection. 5. Arthroscopic rotator cuff repair using a double row suture bridge     repair construct.  SURGEON:  Vania Rea. Sonya Gunnoe, MD  ASSISTANT:  Lucita Lora. Shuford, PA-C  ANESTHESIA:  General endotracheal as well as an interscalene block.  ESTIMATED BLOOD LOSS:  Minimal.  DRAINS:  None.  HISTORY:  [name] is a 78 year old gentleman who has had chronic and progressive increasing left shoulder pain with an impingement syndrome symptoms that have been refractory to prolonged attempts at conservative management.  He was brought to the operating room at this time for planned left shoulder arthroscopy as described below.  Preoperatively, I counseled [name] on treatment options as well as risks versus benefits thereof.  Possible surgical complications were reviewed including potential for bleeding, infection, neurovascular injury, persistent pain, loss of motion, anesthetic complication, recurrence of  rotator cuff tear, and possible need for additional surgery.  He understands and accepts and agrees with our planned procedure.  PROCEDURE IN DETAIL:  After undergoing routine preop evaluation, the patient received prophylactic antibiotics.  An interscalene block was established in the holding area by the Anesthesia Department.  Placed supine on the operating table, underwent smooth induction of a general endotracheal anesthesia.  Turned to the right lateral decubitus position on a beanbag and appropriately padded and protected.  Left shoulder examination under anesthesia revealed some modest restrictions in mobility with approximately 160 degrees of forward elevation and 7 degrees of internal and external rotation.  Left arm was then suspended at 70 degrees of abduction with 10 pounds of traction.  Left shoulder girdle region was sterilely prepped and draped in standard fashion. Time-out was called.  He received prophylactic antibiotics.  A posterior portal was established in the glenohumeral joint and an anterior portal was established under direct visualization.  There was noted to be some mild diffuse synovitis and changes consistent with early arthrosis, evidenced by some osteophytic spurring in relation to the __________ margin of the posterior aspect of the humeral head, and generalized thinning of the articular cartilage, but no specific intra-articular pathologies have required surgical intervention at this time.  Biceps anchor was stable.  Biceps tendon normal in caliber.  The rotator cuff showed posterior __________ intact and some irregularities and incompetence involving the distal supraspinatus when visualized from the articular side.  No evidence of distal biceps  instability.  At this point, __________ instruments were removed from the glenohumeral joint. The arm was dropped down to 30 degrees abduction with the arthroscope introduced in the subacromial space in the  posterior portal and a direct lateral portal was established in the subacromial space.  Abundant __________ multiple adhesions were encountered and these all divided and excised with a combination of shaver and Stryker wand.  The wand was then used to remove the periosteum from the undersurface of the anterior half of the acromion and the subacromial decompression was performed with a burr, creating a type 1 morphology __________ anterior to the distal clavicle and distal clavicle resection was performed with a burr. Care was taken to confirm visualization of entire circumference of distal clavicle to ensure adequate removal of bone.  We then completed subacromial/subdeltoid bursectomy.  The rotator cuff tear was readily identified, had a horizontal split element when I had debrided the rotator cuff back to healthy tissue, and this was somewhat of a "L" shaped tear.  I mobilized the cuff on both the articular and bursal sides and confirmed the tissue could be brought over the apex of the tuberosity.  We then prepared the tuberosity, removing the soft tissue and then gently abrading the bone.  I placed a single Arthrex PEEK corkscrew suture anchor of lateral margin of acromion into the center of the footprint in the tuberosity which was approximately 3 cm in width. Accessory portal __________ was established __________ lateral margin of acromion __________.  The suture anchor and then the suture limbs were shuttled through the free margin of the rotator cuff utilizing a Mitek suture retriever in a horizontal mattress pattern and then tied with sliding locking knots followed by multiple overhand throws and alternating posts.  We then created "suture bridge" with 2 SwiveLock suture anchors and this nicely compressed the free margin of the rotator cuff against the bony bed in the tuberosity.  The overall construct was much to our satisfaction.  Suture limbs were clipped.  Final hemostasis was  obtained.  __________ removed.  The portals closed with Monocryl and Steri-Strips.  Dry dressing __________ left shoulder.  Left arm is in sling.  The patient was awakened, extubated, and taken to the recovery room in stable condition.  Ralene Bathe, PA-C, was used as an Geophysicist/field seismologist throughout this case, essential for help with positioning of extremity and management of the arthroscopic equipment, tissue manipulation, wound closure, and the intraoperative decision making.    Vania Rea. Mario Coronado, M.D.    KMS/MEDQ  D:  03/02/2013  T:  03/03/2013  Job:  [id number]

## 2013-09-25 ENCOUNTER — Encounter: Payer: Self-pay | Admitting: Internal Medicine

## 2013-11-03 ENCOUNTER — Ambulatory Visit (AMBULATORY_SURGERY_CENTER): Payer: Self-pay | Admitting: *Deleted

## 2013-11-03 VITALS — Ht 66.0 in | Wt 154.0 lb

## 2013-11-03 DIAGNOSIS — Z860100 Personal history of colon polyps, unspecified: Secondary | ICD-10-CM

## 2013-11-03 MED ORDER — MOVIPREP 100 G PO SOLR: 1.0000 | Freq: Once | ORAL | Status: DC

## 2013-11-03 NOTE — Progress Notes (Signed)
No egg or soy allergy. No anesthesia problems.  No home O2.  No diet meds.  No email provided.

## 2013-11-17 ENCOUNTER — Ambulatory Visit (AMBULATORY_SURGERY_CENTER): Payer: Medicare Other | Admitting: Internal Medicine

## 2013-11-17 ENCOUNTER — Encounter: Payer: Self-pay | Admitting: Internal Medicine

## 2013-11-17 VITALS — BP 115/64 | HR 51 | Temp 96.4°F | Resp 13 | Ht 66.0 in | Wt 154.0 lb

## 2013-11-17 DIAGNOSIS — Z86010 Personal history of colonic polyps: Secondary | ICD-10-CM

## 2013-11-17 DIAGNOSIS — Z1211 Encounter for screening for malignant neoplasm of colon: Secondary | ICD-10-CM

## 2013-11-17 MED ORDER — SODIUM CHLORIDE 0.9 % IV SOLN: 500.0000 mL | INTRAVENOUS | Status: DC

## 2013-11-17 NOTE — Progress Notes (Signed)
Report to PACU, RN, vss, BBS= Clear.  

## 2013-11-17 NOTE — Progress Notes (Signed)
No complaints noted in the recovery room. Maw   

## 2013-11-17 NOTE — Patient Instructions (Signed)

## 2013-11-17 NOTE — Op Note (Signed)
[place]  [address]. [place], [address]   COLONOSCOPY PROCEDURE REPORT  PATIENT: Steven [name], Steven [name]  MR#: [id number] BIRTHDATE: 04/11/1935 , 78  yrs. old GENDER: Male ENDOSCOPIST: Eustace Quail, MD REFERRED TI:RWERX Noah Delaine, M.D. PROCEDURE DATE:  11/17/2013 PROCEDURE:   Colonoscopy, screening First Screening Colonoscopy - Avg.  risk and is 50 yrs.  old or older - No.  Prior Negative Screening - Now for repeat screening. 10 or more years since last screening  History of Adenoma - Now for follow-up colonoscopy & has been > or = to 3 yrs.  N/A  Polyps Removed Today? No.  Recommend repeat exam, <10 yrs? No. ASA CLASS:   Class II INDICATIONS:average risk screening. Prior examinations 1993 and 2004 negative for neoplasia. MEDICATIONS: MAC sedation, administered by CRNA and propofol (Diprivan) 240mg  IV  DESCRIPTION OF PROCEDURE:   After the risks benefits and alternatives of the procedure were thoroughly explained, informed consent was obtained.  A digital rectal exam revealed no abnormalities of the rectum.   The LB [id number]  endoscope was introduced through the anus and advanced to the cecum, which was identified by both the appendix and ileocecal valve. No adverse events experienced.   The quality of the prep was excellent, using MoviPrep  The instrument was then slowly withdrawn as the colon was fully examined.  COLON FINDINGS: Moderate diverticulosis was noted in the sigmoid colon and a few scattered diverticula in the right colon.   The colon mucosa was otherwise normal. No polyps or other abnormalities.  Retroflexed views revealed no abnormalities. The time to cecum=4 minutes 09 seconds.  Withdrawal time=6 minutes 42 seconds.  The scope was withdrawn and the procedure completed. COMPLICATIONS: There were no complications.  ENDOSCOPIC IMPRESSION: 1.   Moderate diverticulosis of the colon 2.   The colon mucosa was otherwise  normal  RECOMMENDATIONS: 1. Return to the care of your primary provider.  GI follow up as needed   eSigned:  Eustace Quail, MD 11/17/2013 11:35 AM   cc: Thressa Sheller, MD and The Patient

## 2013-11-20 ENCOUNTER — Telehealth: Payer: Self-pay | Admitting: *Deleted

## 2013-11-20 NOTE — Telephone Encounter (Signed)
  Follow up Call-  Call back number 11/17/2013  Post procedure Call Back phone  # [phone]  Permission to leave phone message Yes     Spoke with patient's wife.  Pt was still asleep.  States that patient is "doing fine".  She will let him know that we called to check on him.

## 2014-04-09 ENCOUNTER — Encounter (HOSPITAL_COMMUNITY): Payer: Self-pay | Admitting: Emergency Medicine

## 2014-04-09 ENCOUNTER — Emergency Department (HOSPITAL_COMMUNITY): Payer: Medicare Other

## 2014-04-09 ENCOUNTER — Inpatient Hospital Stay (HOSPITAL_COMMUNITY)
Admission: EM | Admit: 2014-04-09 | Discharge: 2014-04-12 | DRG: 390 | Disposition: A | Discharge status: Home / Self Care | Payer: Medicare Other | Attending: Internal Medicine | Admitting: Internal Medicine

## 2014-04-09 DIAGNOSIS — F329 Major depressive disorder, single episode, unspecified: Secondary | ICD-10-CM | POA: Diagnosis present

## 2014-04-09 DIAGNOSIS — Z881 Allergy status to other antibiotic agents status: Secondary | ICD-10-CM

## 2014-04-09 DIAGNOSIS — K5660 Unspecified intestinal obstruction: Principal | ICD-10-CM | POA: Diagnosis present

## 2014-04-09 DIAGNOSIS — Z8739 Personal history of other diseases of the musculoskeletal system and connective tissue: Secondary | ICD-10-CM

## 2014-04-09 DIAGNOSIS — Z933 Colostomy status: Secondary | ICD-10-CM

## 2014-04-09 DIAGNOSIS — Z87891 Personal history of nicotine dependence: Secondary | ICD-10-CM

## 2014-04-09 DIAGNOSIS — R109 Unspecified abdominal pain: Secondary | ICD-10-CM | POA: Diagnosis not present

## 2014-04-09 DIAGNOSIS — E782 Mixed hyperlipidemia: Secondary | ICD-10-CM | POA: Diagnosis present

## 2014-04-09 DIAGNOSIS — K760 Fatty (change of) liver, not elsewhere classified: Secondary | ICD-10-CM | POA: Diagnosis present

## 2014-04-09 DIAGNOSIS — M1990 Unspecified osteoarthritis, unspecified site: Secondary | ICD-10-CM | POA: Diagnosis present

## 2014-04-09 DIAGNOSIS — Z87442 Personal history of urinary calculi: Secondary | ICD-10-CM

## 2014-04-09 DIAGNOSIS — Z9049 Acquired absence of other specified parts of digestive tract: Secondary | ICD-10-CM | POA: Diagnosis present

## 2014-04-09 DIAGNOSIS — N200 Calculus of kidney: Secondary | ICD-10-CM | POA: Diagnosis present

## 2014-04-09 DIAGNOSIS — M459 Ankylosing spondylitis of unspecified sites in spine: Secondary | ICD-10-CM | POA: Diagnosis present

## 2014-04-09 DIAGNOSIS — K56609 Unspecified intestinal obstruction, unspecified as to partial versus complete obstruction: Secondary | ICD-10-CM | POA: Diagnosis present

## 2014-04-09 DIAGNOSIS — R1033 Periumbilical pain: Secondary | ICD-10-CM

## 2014-04-09 DIAGNOSIS — E785 Hyperlipidemia, unspecified: Secondary | ICD-10-CM | POA: Diagnosis present

## 2014-04-09 LAB — COMPREHENSIVE METABOLIC PANEL
ALT: 18 U/L (ref 0–53)
ANION GAP: 11 (ref 5–15)
AST: 30 U/L (ref 0–37)
Albumin: 4.6 g/dL (ref 3.5–5.2)
Alkaline Phosphatase: 40 U/L (ref 39–117)
BUN: 23 mg/dL (ref 6–23)
CO2: 26 mmol/L (ref 19–32)
CREATININE: 1 mg/dL (ref 0.50–1.35)
Calcium: 9.3 mg/dL (ref 8.4–10.5)
Chloride: 103 mEq/L (ref 96–112)
GFR calc non Af Amer: 70 mL/min — ABNORMAL LOW (ref >90)
GFR, EST AFRICAN AMERICAN: 82 mL/min — AB (ref >90)
GLUCOSE: 142 mg/dL — AB (ref 70–99)
Potassium: 4.3 mmol/L (ref 3.5–5.1)
Sodium: 140 mmol/L (ref 135–145)
TOTAL PROTEIN: 7.5 g/dL (ref 6.0–8.3)
Total Bilirubin: 0.6 mg/dL (ref 0.3–1.2)

## 2014-04-09 LAB — CBC WITH DIFFERENTIAL/PLATELET
Basophils Absolute: 0 10*3/uL (ref 0.0–0.1)
Basophils Relative: 0 % (ref 0–1)
EOS ABS: 0.2 10*3/uL (ref 0.0–0.7)
EOS PCT: 2 % (ref 0–5)
HCT: 41.2 % (ref 39.0–52.0)
HEMOGLOBIN: 13.3 g/dL (ref 13.0–17.0)
LYMPHS ABS: 1.1 10*3/uL (ref 0.7–4.0)
Lymphocytes Relative: 11 % — ABNORMAL LOW (ref 12–46)
MCH: 30.4 pg (ref 26.0–34.0)
MCHC: 32.3 g/dL (ref 30.0–36.0)
MCV: 94.1 fL (ref 78.0–100.0)
MONOS PCT: 7 % (ref 3–12)
Monocytes Absolute: 0.6 10*3/uL (ref 0.1–1.0)
Neutro Abs: 7.7 10*3/uL (ref 1.7–7.7)
Neutrophils Relative %: 80 % — ABNORMAL HIGH (ref 43–77)
Platelets: 300 10*3/uL (ref 150–400)
RBC: 4.38 MIL/uL (ref 4.22–5.81)
RDW: 12.7 % (ref 11.5–15.5)
WBC: 9.7 10*3/uL (ref 4.0–10.5)

## 2014-04-09 LAB — URINALYSIS, ROUTINE W REFLEX MICROSCOPIC
Bilirubin Urine: NEGATIVE
Glucose, UA: NEGATIVE mg/dL
Hgb urine dipstick: NEGATIVE
Ketones, ur: NEGATIVE mg/dL
LEUKOCYTES UA: NEGATIVE
NITRITE: NEGATIVE
PROTEIN: NEGATIVE mg/dL
Specific Gravity, Urine: 1.019 (ref 1.005–1.030)
UROBILINOGEN UA: 0.2 mg/dL (ref 0.0–1.0)
pH: 6.5 (ref 5.0–8.0)

## 2014-04-09 LAB — LIPASE, BLOOD: LIPASE: 32 U/L (ref 11–59)

## 2014-04-09 NOTE — ED Notes (Signed)
Pt arrived to the ED with a complaint of abdominal pain.  Pt pain is located medially starting on the lower pelvis and radiating to the mid abdomen region.  Pt states that the pain is intermittent.  Pt has a hx of small bowel obstructions and colon abscesses.  Pt states the pain is sharp then dulls away only to return as sharp.

## 2014-04-09 NOTE — ED Notes (Signed)
Patient transported to X-ray 

## 2014-04-09 NOTE — ED Provider Notes (Signed)
CSN: [id number]     Arrival date & time 04/09/14  2101 History   First MD Initiated Contact with Patient 04/09/14 2219     Chief Complaint  Patient presents with  . Abdominal Pain     (Consider location/radiation/quality/duration/timing/severity/associated sxs/prior Treatment) HPI Steven [name] is a 79 y.o. male with a history of colonic abscess status post colon resection 1995, cholecystectomy, appendectomy comes in for evaluation of lower abdominal discomfort. Patient states at 5:00 this evening he experienced a "sharp, acute onset of crampy pain". He compared it to really needing to have a bowel movement or passed gas. His last bowel movement was at 11 AM this morning and was normal for him. He denies any discomfort now reports he has been symptom-free for the past 30 minutes. He denies any associated nausea or vomiting, diarrhea or constipation. No headaches, chest pain, shortness of breath, numbness or weakness, dysuria, hematuria, flank pain.  Past Medical History  Diagnosis Date  . Kidney calculi   . Anxiety   . Mental disorder   . Arthritis     ankylosing spodilitis  . Allergy     enviromental  . Blood transfusion without reported diagnosis     during surgery  . Cataract   . Depression   . Hyperlipidemia    Past Surgical History  Procedure Laterality Date  . Abdominal surgery      had abcess  . Joint replacement      hip  . Shoulder arthroscopy with rotator cuff repair and subacromial decompression Left 03/02/2013    Procedure: LEFT SHOULDER ARTHROSCOPY WITH SUBACROMIAL DECOMPRESSION DISTAL CALVICLE RESECTION AND ROTATOR CUFF REPAIR ;  Surgeon: [name], MD;  Location: [place];  Service: Orthopedics;  Laterality: Left;  . Cholecystectomy    . Colonoscopy    . Colostomy    . Colostomy reversal    . Appendectomy     Family History  Problem Relation Age of Onset  . Colon cancer Neg Hx    History  Substance Use Topics  . Smoking status: Former Research scientist (life sciences)  .  Smokeless tobacco: Never Used  . Alcohol Use: Yes     Comment: 3 beers or glasses of wine a day     Review of Systems A 10 point review of systems was completed and was negative except for pertinent positives and negatives as mentioned in the history of present illness     Allergies  Indomethacin  Home Medications   Prior to Admission medications   Medication Sig Start Date End Date Taking? Authorizing Provider  Calcium Carbonate-Vitamin D (CALCIUM 600+D) 600-400 MG-UNIT per tablet Take 1 tablet by mouth 2 (two) times daily.   Yes Historical Provider, MD  cholecalciferol (VITAMIN D) 400 UNITS TABS tablet Take 400 Units by mouth daily.   Yes Historical Provider, MD  citalopram (CELEXA) 40 MG tablet Take 40 mg by mouth daily.   Yes Historical Provider, MD  fenofibrate (TRICOR) 145 MG tablet Take 145 mg by mouth every evening.    Yes Historical Provider, MD  folic acid-pyridoxine-cyancobalamin (FOLBIC) 2.5-25-2 MG TABS Take 1 tablet by mouth every evening.    Yes Historical Provider, MD  HYDROcodone-acetaminophen (NORCO/VICODIN) 5-325 MG per tablet Take 1 tablet by mouth every evening.    Yes Historical Provider, MD  meloxicam (MOBIC) 7.5 MG tablet Take 7.5 mg by mouth 2 (two) times daily.   Yes Historical Provider, MD  Multiple Vitamins-Minerals (PRESERVISION AREDS 2 PO) Take 1 tablet by mouth daily.  Yes Historical Provider, MD  simvastatin (ZOCOR) 40 MG tablet Take 40 mg by mouth every evening.    Yes Historical Provider, MD  diazepam (VALIUM) 2 MG tablet Take 1 tablet (2 mg total) by mouth every 6 (six) hours as needed for anxiety. Patient not taking: Reported on 04/09/2014 03/02/13   Olivia Mackie Shuford, PA-C  temazepam (RESTORIL) 30 MG capsule Take 1 capsule (30 mg total) by mouth at bedtime as needed for sleep. Patient not taking: Reported on 04/09/2014 03/02/13   Olivia Mackie Shuford, PA-C   BP 152/65 mmHg  Pulse 73  Temp(Src) 98.6 F (37 C) (Oral)  Resp 18  Ht 5\' 6"  (1.676 m)  Wt 154  lb (69.854 kg)  BMI 24.87 kg/m2  SpO2 97% Physical Exam  Constitutional: He is oriented to person, place, and time. He appears well-developed and well-nourished.  HENT:  Head: Normocephalic and atraumatic.  Mouth/Throat: Oropharynx is clear and moist.  Eyes: Conjunctivae are normal. Pupils are equal, round, and reactive to light. Right eye exhibits no discharge. Left eye exhibits no discharge. No scleral icterus.  Neck: Neck supple.  Cardiovascular: Normal rate, regular rhythm and normal heart sounds.   Pulmonary/Chest: Effort normal and breath sounds normal. No respiratory distress. He has no wheezes. He has no rales.  Abdominal: Soft. Bowel sounds are normal. He exhibits no distension and no mass. There is no rebound and no guarding.  There is mild tenderness to palpation diffusely in the lower abdomen. No distention, guarding or rebound. No palpable masses. No other obvious lesions or deformities appreciated  Musculoskeletal: He exhibits no tenderness.  Neurological: He is alert and oriented to person, place, and time.  Cranial Nerves II-XII grossly intact  Skin: Skin is warm and dry. No rash noted.  Psychiatric: He has a normal mood and affect.  Nursing note and vitals reviewed.   ED Course  Procedures (including critical care time) Labs Review Labs Reviewed  CBC WITH DIFFERENTIAL - Abnormal; Notable for the following:    Neutrophils Relative % 80 (*)    Lymphocytes Relative 11 (*)    All other components within normal limits  COMPREHENSIVE METABOLIC PANEL - Abnormal; Notable for the following:    Glucose, Bld 142 (*)    GFR calc non Af Amer 70 (*)    GFR calc Af Amer 82 (*)    All other components within normal limits  URINALYSIS, ROUTINE W REFLEX MICROSCOPIC - Abnormal; Notable for the following:    APPearance CLOUDY (*)    All other components within normal limits  LIPASE, BLOOD    Imaging Review No results found.   EKG Interpretation None     Meds given in  ED:  Medications  iohexol (OMNIPAQUE) 300 MG/ML solution 50 mL (50 mLs Oral Contrast Given 04/10/14 0056)    New Prescriptions   No medications on file   Filed Vitals:   04/09/14 2112 04/09/14 2120 04/09/14 2349  BP: 152/65  141/71  Pulse: 73  66  Temp:  98.6 F (37 C)   TempSrc:  Oral   Resp: 18  18  Height: 5\' 6"  (1.676 m)    Weight: 154 lb (69.854 kg)    SpO2: 97%  98%     MDM  Steven [name] is a 79 y.o. male with a history of colonic abscess status post colon resection 1995, cholecystectomy, appendectomy comes in for evaluation of lower abdominal discomfort. He equates this sensation to feeling like he has to pass gas, crampy and sharp  in nature.  Vitals stable - WNL -afebrile Pt resting comfortably in ED. denies any discomfort while lying on ED bed. PE--complains of discomfort upon movement, walking to bathroom. TTP diffusely in lower abd, normal BS. Otherwise he is alert, nontoxic in appearance and in no apparent distress. Labwork-noncontributory Imaging--x-ray of abdomen showed air-fluid levels, will obtain CT for better appreciation  Care signed over to [facility]. Pending CT abdomen for better appreciation of air-fluid levels seen on x-ray. If negative for SBO or other emergent pathology, patient may be discharged home to follow-up with his PCP, Dr. Noah Delaine.  Prior to sign out, I discussed this patient with Dr. Roderic Palau Final diagnoses:  None        Verl Dicker, PA-C 04/10/14 [place], MD 04/10/14 [phone]

## 2014-04-09 NOTE — ED Notes (Signed)
Pt ambulated to restroom w/o difficulty.

## 2014-04-10 ENCOUNTER — Encounter (HOSPITAL_COMMUNITY): Payer: Self-pay

## 2014-04-10 ENCOUNTER — Emergency Department (HOSPITAL_COMMUNITY): Payer: Medicare Other

## 2014-04-10 DIAGNOSIS — E785 Hyperlipidemia, unspecified: Secondary | ICD-10-CM | POA: Diagnosis present

## 2014-04-10 DIAGNOSIS — N200 Calculus of kidney: Secondary | ICD-10-CM | POA: Diagnosis present

## 2014-04-10 DIAGNOSIS — R1033 Periumbilical pain: Secondary | ICD-10-CM

## 2014-04-10 DIAGNOSIS — M1990 Unspecified osteoarthritis, unspecified site: Secondary | ICD-10-CM | POA: Diagnosis present

## 2014-04-10 DIAGNOSIS — F329 Major depressive disorder, single episode, unspecified: Secondary | ICD-10-CM | POA: Diagnosis present

## 2014-04-10 DIAGNOSIS — Z87891 Personal history of nicotine dependence: Secondary | ICD-10-CM | POA: Diagnosis not present

## 2014-04-10 DIAGNOSIS — M459 Ankylosing spondylitis of unspecified sites in spine: Secondary | ICD-10-CM | POA: Diagnosis present

## 2014-04-10 DIAGNOSIS — Z933 Colostomy status: Secondary | ICD-10-CM | POA: Diagnosis not present

## 2014-04-10 DIAGNOSIS — R109 Unspecified abdominal pain: Secondary | ICD-10-CM | POA: Diagnosis present

## 2014-04-10 DIAGNOSIS — Z8739 Personal history of other diseases of the musculoskeletal system and connective tissue: Secondary | ICD-10-CM

## 2014-04-10 DIAGNOSIS — Z881 Allergy status to other antibiotic agents status: Secondary | ICD-10-CM | POA: Diagnosis not present

## 2014-04-10 DIAGNOSIS — Z9049 Acquired absence of other specified parts of digestive tract: Secondary | ICD-10-CM | POA: Diagnosis present

## 2014-04-10 DIAGNOSIS — K5660 Unspecified intestinal obstruction: Principal | ICD-10-CM

## 2014-04-10 DIAGNOSIS — R1084 Generalized abdominal pain: Secondary | ICD-10-CM

## 2014-04-10 DIAGNOSIS — K56609 Unspecified intestinal obstruction, unspecified as to partial versus complete obstruction: Secondary | ICD-10-CM | POA: Diagnosis present

## 2014-04-10 DIAGNOSIS — E782 Mixed hyperlipidemia: Secondary | ICD-10-CM | POA: Diagnosis present

## 2014-04-10 DIAGNOSIS — K760 Fatty (change of) liver, not elsewhere classified: Secondary | ICD-10-CM | POA: Diagnosis present

## 2014-04-10 DIAGNOSIS — Z87442 Personal history of urinary calculi: Secondary | ICD-10-CM | POA: Diagnosis not present

## 2014-04-10 LAB — GLUCOSE, CAPILLARY
GLUCOSE-CAPILLARY: 127 mg/dL — AB (ref 70–99)
Glucose-Capillary: 124 mg/dL — ABNORMAL HIGH (ref 70–99)
Glucose-Capillary: 133 mg/dL — ABNORMAL HIGH (ref 70–99)
Glucose-Capillary: 147 mg/dL — ABNORMAL HIGH (ref 70–99)

## 2014-04-10 LAB — COMPREHENSIVE METABOLIC PANEL
ALT: 17 U/L (ref 0–53)
AST: 28 U/L (ref 0–37)
Albumin: 4 g/dL (ref 3.5–5.2)
Alkaline Phosphatase: 40 U/L (ref 39–117)
Anion gap: 5 (ref 5–15)
BUN: 18 mg/dL (ref 6–23)
CALCIUM: 8.9 mg/dL (ref 8.4–10.5)
CO2: 26 mmol/L (ref 19–32)
CREATININE: 0.84 mg/dL (ref 0.50–1.35)
Chloride: 103 mEq/L (ref 96–112)
GFR calc Af Amer: >90 mL/min (ref >90)
GFR, EST NON AFRICAN AMERICAN: 82 mL/min — AB (ref >90)
Glucose, Bld: 134 mg/dL — ABNORMAL HIGH (ref 70–99)
Potassium: 3.8 mmol/L (ref 3.5–5.1)
Sodium: 134 mmol/L — ABNORMAL LOW (ref 135–145)
Total Bilirubin: 0.6 mg/dL (ref 0.3–1.2)
Total Protein: 6.9 g/dL (ref 6.0–8.3)

## 2014-04-10 LAB — CBC WITH DIFFERENTIAL/PLATELET
BASOS ABS: 0 10*3/uL (ref 0.0–0.1)
BASOS PCT: 1 % (ref 0–1)
EOS ABS: 0.3 10*3/uL (ref 0.0–0.7)
Eosinophils Relative: 4 % (ref 0–5)
HCT: 37.7 % — ABNORMAL LOW (ref 39.0–52.0)
Hemoglobin: 12.7 g/dL — ABNORMAL LOW (ref 13.0–17.0)
LYMPHS PCT: 14 % (ref 12–46)
Lymphs Abs: 1 10*3/uL (ref 0.7–4.0)
MCH: 31.4 pg (ref 26.0–34.0)
MCHC: 33.7 g/dL (ref 30.0–36.0)
MCV: 93.3 fL (ref 78.0–100.0)
Monocytes Absolute: 0.6 10*3/uL (ref 0.1–1.0)
Monocytes Relative: 8 % (ref 3–12)
Neutro Abs: 5.1 10*3/uL (ref 1.7–7.7)
Neutrophils Relative %: 73 % (ref 43–77)
PLATELETS: 256 10*3/uL (ref 150–400)
RBC: 4.04 MIL/uL — AB (ref 4.22–5.81)
RDW: 12.7 % (ref 11.5–15.5)
WBC: 7 10*3/uL (ref 4.0–10.5)

## 2014-04-10 LAB — LACTIC ACID, PLASMA: LACTIC ACID, VENOUS: 1.4 mmol/L (ref 0.5–2.2)

## 2014-04-10 MED ORDER — MORPHINE SULFATE 2 MG/ML IJ SOLN: 1.0000 mg | INTRAMUSCULAR | Status: DC | PRN

## 2014-04-10 MED ORDER — IOHEXOL 300 MG/ML  SOLN
50.0000 mL | Freq: Once | INTRAMUSCULAR | Status: AC | PRN
  Administered 2014-04-10: 50 mL via ORAL

## 2014-04-10 MED ORDER — ONDANSETRON HCL 4 MG/2ML IJ SOLN: 4.0000 mg | Freq: Four times a day (QID) | INTRAMUSCULAR | Status: DC | PRN

## 2014-04-10 MED ORDER — ACETAMINOPHEN 325 MG PO TABS: 650.0000 mg | ORAL_TABLET | Freq: Four times a day (QID) | ORAL | Status: DC | PRN

## 2014-04-10 MED ORDER — IOHEXOL 300 MG/ML  SOLN
100.0000 mL | Freq: Once | INTRAMUSCULAR | Status: AC | PRN
  Administered 2014-04-10: 100 mL via INTRAVENOUS

## 2014-04-10 MED ORDER — DEXTROSE-NACL 5-0.9 % IV SOLN
INTRAVENOUS | Status: AC
  Administered 2014-04-10: 08:00:00 via INTRAVENOUS

## 2014-04-10 MED ORDER — ACETAMINOPHEN 650 MG RE SUPP: 650.0000 mg | Freq: Four times a day (QID) | RECTAL | Status: DC | PRN

## 2014-04-10 MED ORDER — HYDRALAZINE HCL 20 MG/ML IJ SOLN: 10.0000 mg | INTRAMUSCULAR | Status: DC | PRN

## 2014-04-10 MED ORDER — ONDANSETRON HCL 4 MG PO TABS: 4.0000 mg | ORAL_TABLET | Freq: Four times a day (QID) | ORAL | Status: DC | PRN

## 2014-04-10 NOTE — ED Provider Notes (Signed)
H/o resection for colonic abscess, 1995. Normal scope last August (2015) C/o lower abdominal pain, 5:00 p.m. No N, V, D CT pending to r/o obstruction Home if negative.  CT scan showing likely SBO. Patient is re-evaluated. Continues to deny N, V. Has persistent low grade pain, left abdomen greater than right. Discussed with Dr. Hal Hope with [facility] who accepts patient for admission. Discussed with surgery (Dr. Harlow Asa) who will provide consultation. The patient's condition does not currently require NG tube, per Dr. Harlow Asa.  Dewaine Oats, PA-C 04/10/14 [place], MD 04/10/14 [phone]

## 2014-04-10 NOTE — Progress Notes (Signed)
[facility]  Patient admitted this morning. I have examined him and discussed the plan.  Partial small bowel obstruction -Surgery assisting with management-continue nothing by mouth status for now -Has bowel sounds and hopefully is improving-has been having bowel movements as well -Continue IV fluids while nothing by mouth- oral meds on hold  Ankylosing spondylitis -Continue NSAIDs  Debbe Odea, MD

## 2014-04-10 NOTE — Consult Note (Signed)
Reason for Consult:SBO Referring Physician: Rise Patience, MD  Steven [name] is an 79 y.o. male.  HPI: Pt with history of multiple bowel surgeries going back to 1982 with a colostomy and reversal the following year.  Recurrent SBO  1990's and then again back in 2006, and again in 2012. He was admitted last PM with the onset of pain, no nausea or vomiting. Discomfort started about 5 PM.  He had a BM yesterday at 11 AM.  He says this was normal.  He has had a loose BM this AM also.  Some flatus this AM.   CT scan last PM shows multiple loops of SB with air fluid levels consistent with obstruction, large amount of retained stool, bilateral nephrolithiasis non obstructing, hepatic steatosis, and ankylosing spondylitis.  He is NPO, labs OK  Past Medical History  Diagnosis Date  . Kidney calculi   . Anxiety   . Mental disorder   . Arthritis     ankylosing spodilitis  . Allergy     enviromental  . Blood transfusion without reported diagnosis     during surgery  . Cataract   . Depression   . Hyperlipidemia     Past Surgical History  Procedure Laterality Date  . Abdominal surgery      had abcess  . Joint replacement      hip  . Shoulder arthroscopy with rotator cuff repair and subacromial decompression Left 03/02/2013    Procedure: LEFT SHOULDER ARTHROSCOPY WITH SUBACROMIAL DECOMPRESSION DISTAL CALVICLE RESECTION AND ROTATOR CUFF REPAIR ;  Surgeon: [name], MD;  Location: [facility];  Service: Orthopedics;  Laterality: Left;  . Cholecystectomy    . Colonoscopy    . Colostomy    . Colostomy reversal    . Appendectomy      Family History  Problem Relation Age of Onset  . Colon cancer Neg Hx     Social History:  reports that he has quit smoking. He has never used smokeless tobacco. He reports that he drinks alcohol. He reports that he does not use illicit drugs. Tobacco;  None Drugs:  None Etoh  3 beers per day Drugs:  None Retired/lives with wife.   Allergies:   Allergies  Allergen Reactions  . Indomethacin Hives    Medications:  Prior to Admission:  Prescriptions prior to admission  Medication Sig Dispense Refill Last Dose  . Calcium Carbonate-Vitamin D (CALCIUM 600+D) 600-400 MG-UNIT per tablet Take 1 tablet by mouth 2 (two) times daily.   04/09/2014 at Unknown time  . cholecalciferol (VITAMIN D) 400 UNITS TABS tablet Take 400 Units by mouth daily.   04/09/2014 at Unknown time  . citalopram (CELEXA) 40 MG tablet Take 40 mg by mouth daily.   04/09/2014 at Unknown time  . fenofibrate (TRICOR) 145 MG tablet Take 145 mg by mouth every evening.    04/08/2014 at Unknown time  . folic acid-pyridoxine-cyancobalamin (FOLBIC) 2.5-25-2 MG TABS Take 1 tablet by mouth every evening.    04/08/2014 at Unknown time  . HYDROcodone-acetaminophen (NORCO/VICODIN) 5-325 MG per tablet Take 1 tablet by mouth every evening.    04/08/2014 at Unknown time  . meloxicam (MOBIC) 7.5 MG tablet Take 7.5 mg by mouth 2 (two) times daily.   04/09/2014 at Unknown time  . Multiple Vitamins-Minerals (PRESERVISION AREDS 2 PO) Take 1 tablet by mouth daily.    04/08/2014 at Unknown time  . simvastatin (ZOCOR) 40 MG tablet Take 40 mg by mouth every evening.  04/08/2014 at Unknown time  . diazepam (VALIUM) 2 MG tablet Take 1 tablet (2 mg total) by mouth every 6 (six) hours as needed for anxiety. (Patient not taking: Reported on 04/09/2014) 30 tablet 1 Unknown  . temazepam (RESTORIL) 30 MG capsule Take 1 capsule (30 mg total) by mouth at bedtime as needed for sleep. (Patient not taking: Reported on 04/09/2014) 30 capsule 1    Scheduled:  Continuous: . dextrose 5 % and 0.9% NaCl 75 mL/hr at 04/10/14 0746   PPI:[id number] **OR** acetaminophen, hydrALAZINE, morphine injection, ondansetron **OR** ondansetron (ZOFRAN) IV Anti-infectives    None      Results for orders placed or performed during the hospital encounter of 04/09/14 (from the past 48 hour(s))  CBC WITH DIFFERENTIAL      Status: Abnormal   Collection Time: 04/09/14  9:23 PM  Result Value Ref Range   WBC 9.7 4.0 - 10.5 K/uL   RBC 4.38 4.22 - 5.81 MIL/uL   Hemoglobin 13.3 13.0 - 17.0 g/dL   HCT 41.2 39.0 - 52.0 %   MCV 94.1 78.0 - 100.0 fL   MCH 30.4 26.0 - 34.0 pg   MCHC 32.3 30.0 - 36.0 g/dL   RDW 12.7 11.5 - 15.5 %   Platelets 300 150 - 400 K/uL   Neutrophils Relative % 80 (H) 43 - 77 %   Neutro Abs 7.7 1.7 - 7.7 K/uL   Lymphocytes Relative 11 (L) 12 - 46 %   Lymphs Abs 1.1 0.7 - 4.0 K/uL   Monocytes Relative 7 3 - 12 %   Monocytes Absolute 0.6 0.1 - 1.0 K/uL   Eosinophils Relative 2 0 - 5 %   Eosinophils Absolute 0.2 0.0 - 0.7 K/uL   Basophils Relative 0 0 - 1 %   Basophils Absolute 0.0 0.0 - 0.1 K/uL  Comprehensive metabolic panel     Status: Abnormal   Collection Time: 04/09/14  9:23 PM  Result Value Ref Range   Sodium 140 135 - 145 mmol/L    Comment: Please note change in reference range.   Potassium 4.3 3.5 - 5.1 mmol/L    Comment: Please note change in reference range.   Chloride 103 96 - 112 mEq/L   CO2 26 19 - 32 mmol/L   Glucose, Bld 142 (H) 70 - 99 mg/dL   BUN 23 6 - 23 mg/dL   Creatinine, Ser 1.00 0.50 - 1.35 mg/dL   Calcium 9.3 8.4 - 10.5 mg/dL   Total Protein 7.5 6.0 - 8.3 g/dL   Albumin 4.6 3.5 - 5.2 g/dL   AST 30 0 - 37 U/L   ALT 18 0 - 53 U/L   Alkaline Phosphatase 40 39 - 117 U/L   Total Bilirubin 0.6 0.3 - 1.2 mg/dL   GFR calc non Af Amer 70 (L) >90 mL/min   GFR calc Af Amer 82 (L) >90 mL/min    Comment: (NOTE) The eGFR has been calculated using the CKD EPI equation. This calculation has not been validated in all clinical situations. eGFR's persistently <90 mL/min signify possible Chronic Kidney Disease.    Anion gap 11 5 - 15  Lipase, blood     Status: None   Collection Time: 04/09/14  9:23 PM  Result Value Ref Range   Lipase 32 11 - 59 U/L  Urinalysis with microscopic     Status: Abnormal   Collection Time: 04/09/14  9:38 PM  Result Value Ref Range    Color, Urine YELLOW YELLOW  APPearance CLOUDY (A) CLEAR   Specific Gravity, Urine 1.019 1.005 - 1.030   pH 6.5 5.0 - 8.0   Glucose, UA NEGATIVE NEGATIVE mg/dL   Hgb urine dipstick NEGATIVE NEGATIVE   Bilirubin Urine NEGATIVE NEGATIVE   Ketones, ur NEGATIVE NEGATIVE mg/dL   Protein, ur NEGATIVE NEGATIVE mg/dL   Urobilinogen, UA 0.2 0.0 - 1.0 mg/dL   Nitrite NEGATIVE NEGATIVE   Leukocytes, UA NEGATIVE NEGATIVE    Comment: MICROSCOPIC NOT DONE ON URINES WITH NEGATIVE PROTEIN, BLOOD, LEUKOCYTES, NITRITE, OR GLUCOSE <1000 mg/dL.  Lactic acid, plasma     Status: None   Collection Time: 04/10/14  8:15 AM  Result Value Ref Range   Lactic Acid, Venous 1.4 0.5 - 2.2 mmol/L  Comprehensive metabolic panel     Status: Abnormal   Collection Time: 04/10/14  8:15 AM  Result Value Ref Range   Sodium 134 (L) 135 - 145 mmol/L    Comment: Please note change in reference range.   Potassium 3.8 3.5 - 5.1 mmol/L    Comment: Please note change in reference range.   Chloride 103 96 - 112 mEq/L   CO2 26 19 - 32 mmol/L   Glucose, Bld 134 (H) 70 - 99 mg/dL   BUN 18 6 - 23 mg/dL   Creatinine, Ser 0.84 0.50 - 1.35 mg/dL   Calcium 8.9 8.4 - 10.5 mg/dL   Total Protein 6.9 6.0 - 8.3 g/dL   Albumin 4.0 3.5 - 5.2 g/dL   AST 28 0 - 37 U/L   ALT 17 0 - 53 U/L   Alkaline Phosphatase 40 39 - 117 U/L   Total Bilirubin 0.6 0.3 - 1.2 mg/dL   GFR calc non Af Amer 82 (L) >90 mL/min   GFR calc Af Amer >90 >90 mL/min    Comment: (NOTE) The eGFR has been calculated using the CKD EPI equation. This calculation has not been validated in all clinical situations. eGFR's persistently <90 mL/min signify possible Chronic Kidney Disease.    Anion gap 5 5 - 15  CBC with Differential     Status: Abnormal   Collection Time: 04/10/14  8:15 AM  Result Value Ref Range   WBC 7.0 4.0 - 10.5 K/uL   RBC 4.04 (L) 4.22 - 5.81 MIL/uL   Hemoglobin 12.7 (L) 13.0 - 17.0 g/dL   HCT 37.7 (L) 39.0 - 52.0 %   MCV 93.3 78.0 - 100.0 fL    MCH 31.4 26.0 - 34.0 pg   MCHC 33.7 30.0 - 36.0 g/dL   RDW 12.7 11.5 - 15.5 %   Platelets 256 150 - 400 K/uL   Neutrophils Relative % 73 43 - 77 %   Neutro Abs 5.1 1.7 - 7.7 K/uL   Lymphocytes Relative 14 12 - 46 %   Lymphs Abs 1.0 0.7 - 4.0 K/uL   Monocytes Relative 8 3 - 12 %   Monocytes Absolute 0.6 0.1 - 1.0 K/uL   Eosinophils Relative 4 0 - 5 %   Eosinophils Absolute 0.3 0.0 - 0.7 K/uL   Basophils Relative 1 0 - 1 %   Basophils Absolute 0.0 0.0 - 0.1 K/uL  Glucose, capillary     Status: Abnormal   Collection Time: 04/10/14  8:21 AM  Result Value Ref Range   Glucose-Capillary 124 (H) 70 - 99 mg/dL   Comment 1 Notify RN    Comment 2 Documented in Chart   Glucose, capillary     Status: Abnormal   Collection Time:  04/10/14 12:25 PM  Result Value Ref Range   Glucose-Capillary 127 (H) 70 - 99 mg/dL   Comment 1 Notify RN    Comment 2 Documented in Chart     Ct Abdomen Pelvis W Contrast  04/10/2014   CLINICAL DATA:  Initial evaluation for acute abdominal pain.  EXAM: CT ABDOMEN AND PELVIS WITH CONTRAST  TECHNIQUE: Multidetector CT imaging of the abdomen and pelvis was performed using the standard protocol following bolus administration of intravenous contrast.  CONTRAST:  114m OMNIPAQUE IOHEXOL 300 MG/ML  SOLN  COMPARISON:  Prior radiograph performed on 04/09/2014 as well as prior CT from 11/14/2012.  FINDINGS: Mild subsegmental atelectasis seen dependently within the visualized lung bases. No pleural or pericardial effusion.  Diffuse hypoattenuation within the liver suggestive of steatosis. No focal intrahepatic lesions. Gallbladder is absent. No biliary dilatation. Spleen, adrenal glands, and pancreas demonstrate a normal contrast enhanced appearance.  Scattered nonobstructive calculi measuring up to 6 mm present within the upper pole of the right kidney. Nonobstructive calculi measuring approximately 3-4 mm present within the left kidney. No hydronephrosis or hydroureter parent no  focal enhancing renal mass. Subcentimeter hypodense lesion within the interpolar left kidney is too small the characterize, but statistically likely represents a small cyst.  Stomach within normal limits. There are dilated loops of small bowel clustered within the upper and left abdomen. There are associated internal air-fluid levels. The largest loop measures up to 6 cm in diameter in the left abdomen and is located just adjacent to anastomotic suture line. No definite discrete transition point identified, although the loops of dilated small bowel are seen to taper down to more normal caliber within the lower mid abdomen. The ileum is decompressed and of normal caliber.  Additional anastomotic suture line present about the descending colon. Large amount of retained stool within the colon, suggesting constipation. No acute inflammatory changes seen about the bowels.  Bladder not well evaluated due to extensive streak artifact from right hip arthroplasty. Prostate grossly normal.  No free air or fluid. No adenopathy. Scattered atheromatous plaque present within the intra-abdominal aorta without associated aneurysm.  Diffuse syndesmophytes seen throughout the visualized spine, suggesting ankylosing spondylitis.  IMPRESSION: 1. Multiple dilated loops of small bowel within the upper and mid abdomen with associated air-fluid levels, suspicious for small bowel obstruction. While no discrete transition point is identified, these loops of bowel are seen to taper back to normal caliber within the lower mid abdomen. Underlying adhesive disease is suspected. 2. Large amount of retained stool within the colon, suggesting constipation. 3. Bilateral nonobstructive nephrolithiasis. 4. Hepatic steatosis. 5. Ankylosing spondylitis.   Electronically Signed   By: BJeannine BogaM.D.   On: 04/10/2014 03:41   Dg Abd Acute W/chest  04/09/2014   CLINICAL DATA:  Abdominal pain  EXAM: ACUTE ABDOMEN SERIES (ABDOMEN 2 VIEW & CHEST 1  VIEW)  COMPARISON:  01/16/2014  FINDINGS: There are fluid levels throughout small bowel, including in an enteroenteric anastomosis in the left abdomen which is chronically aneurysmal. There is paucity of colonic gas. No evidence of pneumatosis or pneumoperitoneum.  Normal heart size and aortic contours. There is no pneumonia or edema. No effusion or pneumothorax. Calcified granulomas noted in the right lung. Vague density overlapping the anterior right third rib is most likely summation shadows.  Unchanged appearance of right hip prosthesis, with vertical appearing acetabular component.  There is ankylosing spondylitis with fusion throughout the lumbar spine and of the sacroiliac joints.  IMPRESSION: 1. Fluid levels within  small bowel which could reflect enteritis or early/partial bowel obstruction. 2. Ankylosing spondylitis with extensive spinal fusion.   Electronically Signed   By: Jorje Guild M.D.   On: 04/09/2014 23:50    Review of Systems  Constitutional: Negative.   HENT: Positive for hearing loss.   Eyes:       Decreased vision and hearing has glasses  Respiratory: Negative.   Cardiovascular: Negative.   Gastrointestinal: Positive for abdominal pain. Negative for heartburn, nausea, vomiting, diarrhea, constipation, blood in stool and melena.  Genitourinary: Negative.   Musculoskeletal: Negative.   Skin: Negative.   Neurological: Negative.   Endo/Heme/Allergies: Negative.   Psychiatric/Behavioral: Positive for depression. The patient is nervous/anxious.    Blood pressure 143/76, pulse 66, temperature 98.6 F (37 C), temperature source Oral, resp. rate 16, height '5\' 6"'  (1.676 m), weight 69.854 kg (154 lb), SpO2 98 %. Physical Exam  Constitutional: He is oriented to person, place, and time. He appears well-developed and well-nourished. No distress.  HENT:  Head: Normocephalic and atraumatic.  Nose: Nose normal.  Eyes: Conjunctivae and EOM are normal. Pupils are equal, round, and  reactive to light. Right eye exhibits no discharge. Left eye exhibits no discharge. No scleral icterus.  Neck: Normal range of motion. Neck supple. No JVD present. No tracheal deviation present. No thyromegaly present.  Cardiovascular: Normal rate, regular rhythm, normal heart sounds and intact distal pulses.  Exam reveals no gallop.   No murmur heard. Respiratory: Effort normal and breath sounds normal. No respiratory distress. He has no wheezes. He has no rales.  GI: Soft. He exhibits distension. He exhibits no mass. There is no tenderness. There is no rebound and no guarding.  BS hyperactive,    Musculoskeletal: He exhibits no edema or tenderness.  Lymphadenopathy:    He has no cervical adenopathy.  Neurological: He is alert and oriented to person, place, and time. No cranial nerve deficit.  Skin: Skin is warm and dry. No rash noted. He is not diaphoretic. No erythema. No pallor.  Psychiatric: He has a normal mood and affect. His behavior is normal. Judgment and thought content normal.    Assessment/Plan: 1.  PSBO 2.  Multiple surgeries and multiple SBO's in the past. 3.  Hx of depression/anxiety 4.  Ankylosing spondylitis  Plan:  He seems to be making progress.  I would encourage ambulation, continue bowel rest and we will recheck film in [place].  Kaelynn Igo 04/10/2014, 12:28 PM

## 2014-04-10 NOTE — Progress Notes (Signed)
Utilization review completed.  

## 2014-04-10 NOTE — ED Provider Notes (Signed)
Developed mid abdominal pain approximate 5 PM yesterday. No vomiting. No other associated symptoms. Pain is controlled at present. On exam patient is alert nontoxic abdomen midline surgical scar normal active bowel sounds minimally tender at hypogastric and no guarding rigidity or rebound  Orlie Dakin, MD 04/10/14 [phone]

## 2014-04-10 NOTE — H&P (Signed)
[facility] History and Physical  SHELTON SQUARE IEP:[id number] DOB: 04/18/1934 DOA: 04/09/2014  Referring physician: ER physician. PCP: Thressa Sheller, MD   Chief Complaint: Abdominal pain.  HPI: Steven [name] is a 79 y.o. male with history of ankolysing spondylitis, hyperlipidemia presents to the ER because of abdominal pain. Patient's abdominal pain started last evening which is periumbilical and constant. Denies any associated nausea or vomiting or any diarrhea. Patient's last bowel movement was yesterday. CT abdomen and pelvis done in the ER shows possible bowel obstruction and has been admitted for further management. On-call surgeon Dr. Harlow Asa has been consulted. Patient states he has had previous history of surgery for his abdomen secondary to abdominal abscess and at that time has had colostomy bag placed which was reversed. Patient denies any chest pain or shortness of breath. Pain has improved without any pain medications.  Review of Systems: As presented in the history of presenting illness, rest negative.  Past Medical History  Diagnosis Date  . Kidney calculi   . Anxiety   . Mental disorder   . Arthritis     ankylosing spodilitis  . Allergy     enviromental  . Blood transfusion without reported diagnosis     during surgery  . Cataract   . Depression   . Hyperlipidemia    Past Surgical History  Procedure Laterality Date  . Abdominal surgery      had abcess  . Joint replacement      hip  . Shoulder arthroscopy with rotator cuff repair and subacromial decompression Left 03/02/2013    Procedure: LEFT SHOULDER ARTHROSCOPY WITH SUBACROMIAL DECOMPRESSION DISTAL CALVICLE RESECTION AND ROTATOR CUFF REPAIR ;  Surgeon: [name], MD;  Location: [facility];  Service: Orthopedics;  Laterality: Left;  . Cholecystectomy    . Colonoscopy    . Colostomy    . Colostomy reversal    . Appendectomy     Social History:  reports that he has quit smoking. He has never used  smokeless tobacco. He reports that he drinks alcohol. He reports that he does not use illicit drugs. Where does patient live home. Can patient participate in ADLs? Yes.  Allergies  Allergen Reactions  . Indomethacin Hives    Family History:  Family History  Problem Relation Age of Onset  . Colon cancer Neg Hx       Prior to Admission medications   Medication Sig Start Date End Date Taking? Authorizing Provider  Calcium Carbonate-Vitamin D (CALCIUM 600+D) 600-400 MG-UNIT per tablet Take 1 tablet by mouth 2 (two) times daily.   Yes Historical Provider, MD  cholecalciferol (VITAMIN D) 400 UNITS TABS tablet Take 400 Units by mouth daily.   Yes Historical Provider, MD  citalopram (CELEXA) 40 MG tablet Take 40 mg by mouth daily.   Yes Historical Provider, MD  fenofibrate (TRICOR) 145 MG tablet Take 145 mg by mouth every evening.    Yes Historical Provider, MD  folic acid-pyridoxine-cyancobalamin (FOLBIC) 2.5-25-2 MG TABS Take 1 tablet by mouth every evening.    Yes Historical Provider, MD  HYDROcodone-acetaminophen (NORCO/VICODIN) 5-325 MG per tablet Take 1 tablet by mouth every evening.    Yes Historical Provider, MD  meloxicam (MOBIC) 7.5 MG tablet Take 7.5 mg by mouth 2 (two) times daily.   Yes Historical Provider, MD  Multiple Vitamins-Minerals (PRESERVISION AREDS 2 PO) Take 1 tablet by mouth daily.    Yes Historical Provider, MD  simvastatin (ZOCOR) 40 MG tablet Take 40 mg by mouth every  evening.    Yes Historical Provider, MD  diazepam (VALIUM) 2 MG tablet Take 1 tablet (2 mg total) by mouth every 6 (six) hours as needed for anxiety. Patient not taking: Reported on 04/09/2014 03/02/13   Olivia Mackie Shuford, PA-C  temazepam (RESTORIL) 30 MG capsule Take 1 capsule (30 mg total) by mouth at bedtime as needed for sleep. Patient not taking: Reported on 04/09/2014 03/02/13   Jenetta Loges, PA-C    Physical Exam: Filed Vitals:   04/09/14 2120 04/09/14 2349 04/10/14 0228 04/10/14 0538  BP:   141/71 143/76 143/76  Pulse:  66 60 66  Temp: 98.6 F (37 C)   98.6 F (37 C)  TempSrc: Oral     Resp:  18 16 16   Height:      Weight:      SpO2:  98% 98% 98%     General:  Well-developed and nourished.  Eyes: Anicteric no pallor.  ENT: No discharge from the ears eyes nose or mouth.  Neck: Restricted motion of the neck because of spondylitis.  Cardiovascular: S1-S2 heard.  Respiratory: No rhonchi or crepitations.  Abdomen: Mildly distended nontender bowel sounds not appreciated. No guarding or rigidity.  Skin: No rash.  Musculoskeletal: No edema.  Psychiatric: Appears normal.  Neurologic: Alert awake oriented to time place and person. Moves all extremities.  Labs on Admission:  Basic Metabolic Panel:  Recent Labs Lab 04/09/14 2123  NA 140  K 4.3  CL 103  CO2 26  GLUCOSE 142*  BUN 23  CREATININE 1.00  CALCIUM 9.3   Liver Function Tests:  Recent Labs Lab 04/09/14 2123  AST 30  ALT 18  ALKPHOS 40  BILITOT 0.6  PROT 7.5  ALBUMIN 4.6    Recent Labs Lab 04/09/14 2123  LIPASE 32   No results for input(s): AMMONIA in the last 168 hours. CBC:  Recent Labs Lab 04/09/14 2123  WBC 9.7  NEUTROABS 7.7  HGB 13.3  HCT 41.2  MCV 94.1  PLT 300   Cardiac Enzymes: No results for input(s): CKTOTAL, CKMB, CKMBINDEX, TROPONINI in the last 168 hours.  BNP (last 3 results) No results for input(s): PROBNP in the last 8760 hours. CBG: No results for input(s): GLUCAP in the last 168 hours.  Radiological Exams on Admission: Ct Abdomen Pelvis W Contrast  04/10/2014   CLINICAL DATA:  Initial evaluation for acute abdominal pain.  EXAM: CT ABDOMEN AND PELVIS WITH CONTRAST  TECHNIQUE: Multidetector CT imaging of the abdomen and pelvis was performed using the standard protocol following bolus administration of intravenous contrast.  CONTRAST:  159mL OMNIPAQUE IOHEXOL 300 MG/ML  SOLN  COMPARISON:  Prior radiograph performed on 04/09/2014 as well as prior CT  from 11/14/2012.  FINDINGS: Mild subsegmental atelectasis seen dependently within the visualized lung bases. No pleural or pericardial effusion.  Diffuse hypoattenuation within the liver suggestive of steatosis. No focal intrahepatic lesions. Gallbladder is absent. No biliary dilatation. Spleen, adrenal glands, and pancreas demonstrate a normal contrast enhanced appearance.  Scattered nonobstructive calculi measuring up to 6 mm present within the upper pole of the right kidney. Nonobstructive calculi measuring approximately 3-4 mm present within the left kidney. No hydronephrosis or hydroureter parent no focal enhancing renal mass. Subcentimeter hypodense lesion within the interpolar left kidney is too small the characterize, but statistically likely represents a small cyst.  Stomach within normal limits. There are dilated loops of small bowel clustered within the upper and left abdomen. There are associated internal air-fluid levels. The largest loop  measures up to 6 cm in diameter in the left abdomen and is located just adjacent to anastomotic suture line. No definite discrete transition point identified, although the loops of dilated small bowel are seen to taper down to more normal caliber within the lower mid abdomen. The ileum is decompressed and of normal caliber.  Additional anastomotic suture line present about the descending colon. Large amount of retained stool within the colon, suggesting constipation. No acute inflammatory changes seen about the bowels.  Bladder not well evaluated due to extensive streak artifact from right hip arthroplasty. Prostate grossly normal.  No free air or fluid. No adenopathy. Scattered atheromatous plaque present within the intra-abdominal aorta without associated aneurysm.  Diffuse syndesmophytes seen throughout the visualized spine, suggesting ankylosing spondylitis.  IMPRESSION: 1. Multiple dilated loops of small bowel within the upper and mid abdomen with associated  air-fluid levels, suspicious for small bowel obstruction. While no discrete transition point is identified, these loops of bowel are seen to taper back to normal caliber within the lower mid abdomen. Underlying adhesive disease is suspected. 2. Large amount of retained stool within the colon, suggesting constipation. 3. Bilateral nonobstructive nephrolithiasis. 4. Hepatic steatosis. 5. Ankylosing spondylitis.   Electronically Signed   By: Jeannine Boga M.D.   On: 04/10/2014 03:41   Dg Abd Acute W/chest  04/09/2014   CLINICAL DATA:  Abdominal pain  EXAM: ACUTE ABDOMEN SERIES (ABDOMEN 2 VIEW & CHEST 1 VIEW)  COMPARISON:  01/16/2014  FINDINGS: There are fluid levels throughout small bowel, including in an enteroenteric anastomosis in the left abdomen which is chronically aneurysmal. There is paucity of colonic gas. No evidence of pneumatosis or pneumoperitoneum.  Normal heart size and aortic contours. There is no pneumonia or edema. No effusion or pneumothorax. Calcified granulomas noted in the right lung. Vague density overlapping the anterior right third rib is most likely summation shadows.  Unchanged appearance of right hip prosthesis, with vertical appearing acetabular component.  There is ankylosing spondylitis with fusion throughout the lumbar spine and of the sacroiliac joints.  IMPRESSION: 1. Fluid levels within small bowel which could reflect enteritis or early/partial bowel obstruction. 2. Ankylosing spondylitis with extensive spinal fusion.   Electronically Signed   By: Jorje Guild M.D.   On: 04/09/2014 23:50     Assessment/Plan Active Problems:   Abdominal pain   Bowel obstruction   Hyperlipidemia   History of ankylosing spondylitis   1. Abdominal pain with possible bowel obstruction - surgery has been consulted. At this time patient will be kept nothing by mouth with pain relief medication and gentle hydration. Further recommendations per surgery. 2. Hyperlipidemia - continue  statins once patient can take orally. 3. History of Ankolysing spondylitis - on NSAIDs. 4. Mildly elevated blood pressure - closely follow blood pressure trends and I have placed patient on when necessary IV hydralazine for now.   DVT Prophylaxis SCDs. May change to Lovenox if no surgical procedure anticipated.  Code Status: Full code.  Family Communication: None.  Disposition Plan: Admit to inpatient.    Teryn Gust N. [facility] Pager [phone].  If 7PM-7AM, please contact night-coverage [email or web address] Password [facility] 04/10/2014, 6:48 AM

## 2014-04-11 ENCOUNTER — Inpatient Hospital Stay (HOSPITAL_COMMUNITY): Payer: Medicare Other

## 2014-04-11 LAB — BASIC METABOLIC PANEL
Anion gap: 4 — ABNORMAL LOW (ref 5–15)
BUN: 11 mg/dL (ref 6–23)
CO2: 27 mmol/L (ref 19–32)
CREATININE: 0.8 mg/dL (ref 0.50–1.35)
Calcium: 8.5 mg/dL (ref 8.4–10.5)
Chloride: 107 mEq/L (ref 96–112)
GFR calc non Af Amer: 84 mL/min — ABNORMAL LOW (ref >90)
Glucose, Bld: 138 mg/dL — ABNORMAL HIGH (ref 70–99)
Potassium: 3.9 mmol/L (ref 3.5–5.1)
Sodium: 138 mmol/L (ref 135–145)

## 2014-04-11 LAB — CBC
HCT: 37.6 % — ABNORMAL LOW (ref 39.0–52.0)
Hemoglobin: 12.3 g/dL — ABNORMAL LOW (ref 13.0–17.0)
MCH: 30.4 pg (ref 26.0–34.0)
MCHC: 32.7 g/dL (ref 30.0–36.0)
MCV: 93.1 fL (ref 78.0–100.0)
PLATELETS: 229 10*3/uL (ref 150–400)
RBC: 4.04 MIL/uL — ABNORMAL LOW (ref 4.22–5.81)
RDW: 12.7 % (ref 11.5–15.5)
WBC: 5.7 10*3/uL (ref 4.0–10.5)

## 2014-04-11 LAB — GLUCOSE, CAPILLARY
Glucose-Capillary: 125 mg/dL — ABNORMAL HIGH (ref 70–99)
Glucose-Capillary: 139 mg/dL — ABNORMAL HIGH (ref 70–99)
Glucose-Capillary: 132 mg/dL — ABNORMAL HIGH (ref 70–99)

## 2014-04-11 MED ORDER — ENOXAPARIN SODIUM 40 MG/0.4ML ~~LOC~~ SOLN
40.0000 mg | SUBCUTANEOUS | Status: DC
  Administered 2014-04-11: 40 mg via SUBCUTANEOUS
  Filled 2014-04-11 (×2): qty 0.4

## 2014-04-11 NOTE — Progress Notes (Signed)
Subjective: He looks good, passing gas and 2 BM's.  Feels better.  Somewhat distended still.  Objective: Vital signs in last 24 hours: Temp:  [98 F (36.7 C)-98.4 F (36.9 C)] 98 F (36.7 C) (12/30 0529) Pulse Rate:  [55-64] 55 (12/30 0529) Resp:  [16-18] 17 (12/30 0529) BP: (111-119)/(58-65) 111/58 mmHg (12/30 0529) SpO2:  [98 %-99 %] 98 % (12/30 0529) Last BM Date: 04/11/14 NPO,  2 stools recorded Afebrile, VSS Labs OK Film:  1. Paucity of bowel gas without definite evidence of obstruction. 2. Bilateral nephrolithiasis, as demonstrated on recently performed abdominal CT. 3. Stigmata of ankylosing spondylitis Intake/Output from previous day: 12/29 0701 - 12/30 0700 In: 1617.5 [I.V.:1617.5] Out: 1675 [Urine:1675] Intake/Output this shift: Total I/O In: -  Out: 400 [Urine:400]  General appearance: alert, cooperative and no distress GI: soft, still distended, + BS, +BM  not having pain.  Lab Results:   Recent Labs  04/10/14 0815 04/11/14 0450  WBC 7.0 5.7  HGB 12.7* 12.3*  HCT 37.7* 37.6*  PLT 256 229    BMET  Recent Labs  04/10/14 0815 04/11/14 0450  NA 134* 138  K 3.8 3.9  CL 103 107  CO2 26 27  GLUCOSE 134* 138*  BUN 18 11  CREATININE 0.84 0.80  CALCIUM 8.9 8.5   PT/INR No results for input(s): LABPROT, INR in the last 72 hours.   Recent Labs Lab 04/09/14 2123 04/10/14 0815  AST 30 28  ALT 18 17  ALKPHOS 40 40  BILITOT 0.6 0.6  PROT 7.5 6.9  ALBUMIN 4.6 4.0     Lipase     Component Value Date/Time   LIPASE 32 04/09/2014 2123     Studies/Results: Ct Abdomen Pelvis W Contrast  04/10/2014   CLINICAL DATA:  Initial evaluation for acute abdominal pain.  EXAM: CT ABDOMEN AND PELVIS WITH CONTRAST  TECHNIQUE: Multidetector CT imaging of the abdomen and pelvis was performed using the standard protocol following bolus administration of intravenous contrast.  CONTRAST:  166mL OMNIPAQUE IOHEXOL 300 MG/ML  SOLN  COMPARISON:  Prior  radiograph performed on 04/09/2014 as well as prior CT from 11/14/2012.  FINDINGS: Mild subsegmental atelectasis seen dependently within the visualized lung bases. No pleural or pericardial effusion.  Diffuse hypoattenuation within the liver suggestive of steatosis. No focal intrahepatic lesions. Gallbladder is absent. No biliary dilatation. Spleen, adrenal glands, and pancreas demonstrate a normal contrast enhanced appearance.  Scattered nonobstructive calculi measuring up to 6 mm present within the upper pole of the right kidney. Nonobstructive calculi measuring approximately 3-4 mm present within the left kidney. No hydronephrosis or hydroureter parent no focal enhancing renal mass. Subcentimeter hypodense lesion within the interpolar left kidney is too small the characterize, but statistically likely represents a small cyst.  Stomach within normal limits. There are dilated loops of small bowel clustered within the upper and left abdomen. There are associated internal air-fluid levels. The largest loop measures up to 6 cm in diameter in the left abdomen and is located just adjacent to anastomotic suture line. No definite discrete transition point identified, although the loops of dilated small bowel are seen to taper down to more normal caliber within the lower mid abdomen. The ileum is decompressed and of normal caliber.  Additional anastomotic suture line present about the descending colon. Large amount of retained stool within the colon, suggesting constipation. No acute inflammatory changes seen about the bowels.  Bladder not well evaluated due to extensive streak artifact from right hip arthroplasty. Prostate  grossly normal.  No free air or fluid. No adenopathy. Scattered atheromatous plaque present within the intra-abdominal aorta without associated aneurysm.  Diffuse syndesmophytes seen throughout the visualized spine, suggesting ankylosing spondylitis.  IMPRESSION: 1. Multiple dilated loops of small bowel  within the upper and mid abdomen with associated air-fluid levels, suspicious for small bowel obstruction. While no discrete transition point is identified, these loops of bowel are seen to taper back to normal caliber within the lower mid abdomen. Underlying adhesive disease is suspected. 2. Large amount of retained stool within the colon, suggesting constipation. 3. Bilateral nonobstructive nephrolithiasis. 4. Hepatic steatosis. 5. Ankylosing spondylitis.   Electronically Signed   By: Jeannine Boga M.D.   On: 04/10/2014 03:41   Dg Abd 2 Views  04/11/2014   CLINICAL DATA:  Follow-up small bowel obstruction. No nausea or vomiting.  EXAM: ABDOMEN - 2 VIEW  COMPARISON:  CT abdomen pelvis - 04/10/2014  FINDINGS: There is a paucity of bowel gas without definite evidence of obstruction. No pneumoperitoneum, pneumatosis or portal venous gas.  Post cholecystectomy.  Ill-defined opacities overlie the expected location of the superior poles of the bilateral kidneys, right greater than left an are favored to represent the renal stones demonstrated on recent performed abdominal CT.  Additional ill-defined of opacities overlying the left mid abdomen are favored to represent ingested radiopaque debris.  Post right total hip replacement, incompletely evaluated, though the acetabular cup again demonstrates an exaggerated vertical line. Stigmata of ankylosing spondylitis including fusion of the bilateral SI joints.  Limited visualization the lower thorax is unremarkable.  IMPRESSION: 1. Paucity of bowel gas without definite evidence of obstruction. 2. Bilateral nephrolithiasis, as demonstrated on recently performed abdominal CT. 3. Stigmata of ankylosing spondylitis.   Electronically Signed   By: Sandi Mariscal M.D.   On: 04/11/2014 08:09   Dg Abd Acute W/chest  04/09/2014   CLINICAL DATA:  Abdominal pain  EXAM: ACUTE ABDOMEN SERIES (ABDOMEN 2 VIEW & CHEST 1 VIEW)  COMPARISON:  01/16/2014  FINDINGS: There are fluid  levels throughout small bowel, including in an enteroenteric anastomosis in the left abdomen which is chronically aneurysmal. There is paucity of colonic gas. No evidence of pneumatosis or pneumoperitoneum.  Normal heart size and aortic contours. There is no pneumonia or edema. No effusion or pneumothorax. Calcified granulomas noted in the right lung. Vague density overlapping the anterior right third rib is most likely summation shadows.  Unchanged appearance of right hip prosthesis, with vertical appearing acetabular component.  There is ankylosing spondylitis with fusion throughout the lumbar spine and of the sacroiliac joints.  IMPRESSION: 1. Fluid levels within small bowel which could reflect enteritis or early/partial bowel obstruction. 2. Ankylosing spondylitis with extensive spinal fusion.   Electronically Signed   By: Jorje Guild M.D.   On: 04/09/2014 23:50    Medications:    Assessment/Plan 1. PSBO 2. Multiple surgeries and multiple SBO's in the past. 3. Hx of depression/anxiety 4. Ankylosing spondylitis 5.  SCD for DVT prophylaxis   Plan:  Clear liquids and if ok advance in later this evening if he does well.   LOS: 2 days    Edie Vallandingham 04/11/2014

## 2014-04-11 NOTE — Progress Notes (Signed)
[facility] Progress Note   Steven [name] TML:[id number] DOB: 02-25-1935 DOA: 04/09/2014 PCP: Thressa Sheller, MD  Brief narrative: Steven [name] is a 79 y.o. male with history of ankolysing spondylitis, hyperlipidemia presents to the ER because of abdominal pain. Found to have a small bowel obstruction.   Subjective: Continued to have bowel movements. No appetite. No nausea or vomiting.  Assessment/Plan: Principal Problem:   Bowel obstruction -Surgery assisting with management-large amount of stool noted on CT-spontaneously having bowel movements  -start clears today  Active Problems:   Abdominal pain -Due to above and has resolved    Hyperlipidemia -Hold statin    History of ankylosing spondylitis -Hold NSAIDs    Code Status: Full code Family Communication:  Disposition Plan: Home when stable DVT prophylaxis: Lovenox  Consultants: Surgery  Procedures: None   Antibiotics: Anti-infectives    None         Objective: Filed Weights   04/09/14 2112  Weight: 69.854 kg (154 lb)    Intake/Output Summary (Last 24 hours) at 04/11/14 1323 Last data filed at 04/11/14 0900  Gross per 24 hour  Intake   1450 ml  Output   1700 ml  Net   -250 ml     Vitals Filed Vitals:   04/10/14 0538 04/10/14 1401 04/10/14 2115 04/11/14 0529  BP: 143/76 113/61 119/65 111/58  Pulse: 66 60 64 55  Temp: 98.6 F (37 C) 98.3 F (36.8 C) 98.4 F (36.9 C) 98 F (36.7 C)  TempSrc:   Oral Oral  Resp: 16 18 16 17   Height:      Weight:      SpO2: 98% 99% 99% 98%    Exam: General: Awake alert oriented 3, No acute respiratory distress Lungs: Clear to auscultation bilaterally without wheezes or crackles Cardiovascular: Regular rate and rhythm without murmur gallop or rub normal S1 and S2 Abdomen: Nontender, mildly distended, soft, bowel sounds positive, no rebound, no ascites, no appreciable mass Extremities: No significant cyanosis, clubbing, or edema bilateral lower  extremities  Data Reviewed: Basic Metabolic Panel:  Recent Labs Lab 04/09/14 2123 04/10/14 0815 04/11/14 0450  NA 140 134* 138  K 4.3 3.8 3.9  CL 103 103 107  CO2 26 26 27   GLUCOSE 142* 134* 138*  BUN 23 18 11   CREATININE 1.00 0.84 0.80  CALCIUM 9.3 8.9 8.5   Liver Function Tests:  Recent Labs Lab 04/09/14 2123 04/10/14 0815  AST 30 28  ALT 18 17  ALKPHOS 40 40  BILITOT 0.6 0.6  PROT 7.5 6.9  ALBUMIN 4.6 4.0    Recent Labs Lab 04/09/14 2123  LIPASE 32   No results for input(s): AMMONIA in the last 168 hours. CBC:  Recent Labs Lab 04/09/14 2123 04/10/14 0815 04/11/14 0450  WBC 9.7 7.0 5.7  NEUTROABS 7.7 5.1  --   HGB 13.3 12.7* 12.3*  HCT 41.2 37.7* 37.6*  MCV 94.1 93.3 93.1  PLT 300 256 229   Cardiac Enzymes: No results for input(s): CKTOTAL, CKMB, CKMBINDEX, TROPONINI in the last 168 hours. BNP (last 3 results) No results for input(s): PROBNP in the last 8760 hours. CBG:  Recent Labs Lab 04/10/14 1225 04/10/14 1635 04/10/14 2130 04/11/14 0654 04/11/14 1156  GLUCAP 127* 133* 147* 125* 139*    No results found for this or any previous visit (from the past 240 hour(s)).   Studies:  Recent x-ray studies have been reviewed in detail by the Attending Physician  Scheduled Meds:  Scheduled Meds:  Continuous Infusions:   Time spent on care of this patient: 20 minutes   [place], MD 04/11/2014, 1:23 PM  LOS: 2 days   [facility] Office  [phone] Pager - Text Page per [email or web address]  If 7PM-7AM, please contact night-coverage [email or web address]

## 2014-04-12 DIAGNOSIS — K565 Intestinal adhesions [bands] with obstruction (postprocedural) (postinfection): Secondary | ICD-10-CM

## 2014-04-12 MED ORDER — CITALOPRAM HYDROBROMIDE 40 MG PO TABS
40.0000 mg | ORAL_TABLET | Freq: Every day | ORAL | Status: DC
  Filled 2014-04-12: qty 1

## 2014-04-12 NOTE — Discharge Summary (Signed)
Physician Discharge [place] CLE:[id number] DOB: Apr 09, 1935 DOA: 04/09/2014  PCP: Thressa Sheller, MD  Admit date: 04/09/2014 Discharge date: 04/12/2014  Time spent: 45 min minutes    Discharge Condition: stable Diet recommendation: heart healthy  Discharge Diagnoses:  Principal Problem:   Bowel obstruction Active Problems:   Abdominal pain   Hyperlipidemia   History of ankylosing spondylitis   History of present illness:  Steven [name] is a 79 y.o. male with history of ankolysing spondylitis, hyperlipidemia presenting to the ER because of abdominal pain. Found to have a small bowel obstruction. See imaging reports below Hospital Course:  Principal Problem:  Bowel obstruction- partial -Surgery assisting with management-large amount of stool noted on CT-spontaneously having bowel movements  - xray from 12/30 reveals improvement in obstruction -obstruction resolved with conservative management- now able to tolerate solids   Active Problems:  Abdominal pain -Due to above and has resolved  Hepatic steatosis - noted on CT abdomen  Nephrolithiasis - b/l nonobstructive nephrolithiasis noted on CT   Hyperlipidemia -Holding statin during the hospital stay- will resume now   History of ankylosing spondylitis -Hold NSAIDs  Procedures:  none  Consultations:  surgery  Discharge Exam: Filed Weights   04/09/14 2112  Weight: 69.854 kg (154 lb)   Filed Vitals:   04/12/14 0510  BP: 121/63  Pulse: 56  Temp: 98 F (36.7 C)  Resp: 16    General: AAO x 3, no distress Cardiovascular: RRR, no murmurs  Respiratory: clear to auscultation bilaterally GI: soft, non-tender, non-distended, bowel sound positive  Discharge Instructions You were cared for by a hospitalist during your hospital stay. If you have any questions about your discharge medications or the care you received while you were in the hospital after you are discharged, you can call the  unit and asked to speak with the hospitalist on call if the hospitalist that took care of you is not available. Once you are discharged, your primary care physician will handle any further medical issues. Please note that NO REFILLS for any discharge medications will be authorized once you are discharged, as it is imperative that you return to your primary care physician (or establish a relationship with a primary care physician if you do not have one) for your aftercare needs so that they can reassess your need for medications and monitor your lab values.      Discharge Instructions    Diet - low sodium heart healthy    Complete by:  As directed      Increase activity slowly    Complete by:  As directed             Medication List    TAKE these medications        CALCIUM 600+D 600-400 MG-UNIT per tablet  Generic drug:  Calcium Carbonate-Vitamin D  Take 1 tablet by mouth 2 (two) times daily.     cholecalciferol 400 UNITS Tabs tablet  Commonly known as:  VITAMIN D  Take 400 Units by mouth daily.     citalopram 40 MG tablet  Commonly known as:  CELEXA  Take 40 mg by mouth daily.     diazepam 2 MG tablet  Commonly known as:  VALIUM  Take 1 tablet (2 mg total) by mouth every 6 (six) hours as needed for anxiety.     fenofibrate 145 MG tablet  Commonly known as:  TRICOR  Take 145 mg by mouth every evening.     FOLBIC 2.5-25-2 MG  Tabs  Generic drug:  folic acid-pyridoxine-cyancobalamin  Take 1 tablet by mouth every evening.     HYDROcodone-acetaminophen 5-325 MG per tablet  Commonly known as:  NORCO/VICODIN  Take 1 tablet by mouth every evening.     meloxicam 7.5 MG tablet  Commonly known as:  MOBIC  Take 7.5 mg by mouth 2 (two) times daily.     PRESERVISION AREDS 2 PO  Take 1 tablet by mouth daily.     simvastatin 40 MG tablet  Commonly known as:  ZOCOR  Take 40 mg by mouth every evening.     temazepam 30 MG capsule  Commonly known as:  RESTORIL  Take 1 capsule (30  mg total) by mouth at bedtime as needed for sleep.       Allergies  Allergen Reactions  . Indomethacin Hives      The results of significant diagnostics from this hospitalization (including imaging, microbiology, ancillary and laboratory) are listed below for reference.    Significant Diagnostic Studies: Ct Abdomen Pelvis W Contrast  04/10/2014   CLINICAL DATA:  Initial evaluation for acute abdominal pain.  EXAM: CT ABDOMEN AND PELVIS WITH CONTRAST  TECHNIQUE: Multidetector CT imaging of the abdomen and pelvis was performed using the standard protocol following bolus administration of intravenous contrast.  CONTRAST:  14mL OMNIPAQUE IOHEXOL 300 MG/ML  SOLN  COMPARISON:  Prior radiograph performed on 04/09/2014 as well as prior CT from 11/14/2012.  FINDINGS: Mild subsegmental atelectasis seen dependently within the visualized lung bases. No pleural or pericardial effusion.  Diffuse hypoattenuation within the liver suggestive of steatosis. No focal intrahepatic lesions. Gallbladder is absent. No biliary dilatation. Spleen, adrenal glands, and pancreas demonstrate a normal contrast enhanced appearance.  Scattered nonobstructive calculi measuring up to 6 mm present within the upper pole of the right kidney. Nonobstructive calculi measuring approximately 3-4 mm present within the left kidney. No hydronephrosis or hydroureter parent no focal enhancing renal mass. Subcentimeter hypodense lesion within the interpolar left kidney is too small the characterize, but statistically likely represents a small cyst.  Stomach within normal limits. There are dilated loops of small bowel clustered within the upper and left abdomen. There are associated internal air-fluid levels. The largest loop measures up to 6 cm in diameter in the left abdomen and is located just adjacent to anastomotic suture line. No definite discrete transition point identified, although the loops of dilated small bowel are seen to taper down to  more normal caliber within the lower mid abdomen. The ileum is decompressed and of normal caliber.  Additional anastomotic suture line present about the descending colon. Large amount of retained stool within the colon, suggesting constipation. No acute inflammatory changes seen about the bowels.  Bladder not well evaluated due to extensive streak artifact from right hip arthroplasty. Prostate grossly normal.  No free air or fluid. No adenopathy. Scattered atheromatous plaque present within the intra-abdominal aorta without associated aneurysm.  Diffuse syndesmophytes seen throughout the visualized spine, suggesting ankylosing spondylitis.  IMPRESSION: 1. Multiple dilated loops of small bowel within the upper and mid abdomen with associated air-fluid levels, suspicious for small bowel obstruction. While no discrete transition point is identified, these loops of bowel are seen to taper back to normal caliber within the lower mid abdomen. Underlying adhesive disease is suspected. 2. Large amount of retained stool within the colon, suggesting constipation. 3. Bilateral nonobstructive nephrolithiasis. 4. Hepatic steatosis. 5. Ankylosing spondylitis.   Electronically Signed   By: Jeannine Boga M.D.   On: 04/10/2014  03:41   Dg Abd 2 Views  04/11/2014   CLINICAL DATA:  Follow-up small bowel obstruction. No nausea or vomiting.  EXAM: ABDOMEN - 2 VIEW  COMPARISON:  CT abdomen pelvis - 04/10/2014  FINDINGS: There is a paucity of bowel gas without definite evidence of obstruction. No pneumoperitoneum, pneumatosis or portal venous gas.  Post cholecystectomy.  Ill-defined opacities overlie the expected location of the superior poles of the bilateral kidneys, right greater than left an are favored to represent the renal stones demonstrated on recent performed abdominal CT.  Additional ill-defined of opacities overlying the left mid abdomen are favored to represent ingested radiopaque debris.  Post right total hip  replacement, incompletely evaluated, though the acetabular cup again demonstrates an exaggerated vertical line. Stigmata of ankylosing spondylitis including fusion of the bilateral SI joints.  Limited visualization the lower thorax is unremarkable.  IMPRESSION: 1. Paucity of bowel gas without definite evidence of obstruction. 2. Bilateral nephrolithiasis, as demonstrated on recently performed abdominal CT. 3. Stigmata of ankylosing spondylitis.   Electronically Signed   By: Sandi Mariscal M.D.   On: 04/11/2014 08:09   Dg Abd Acute W/chest  04/09/2014   CLINICAL DATA:  Abdominal pain  EXAM: ACUTE ABDOMEN SERIES (ABDOMEN 2 VIEW & CHEST 1 VIEW)  COMPARISON:  01/16/2014  FINDINGS: There are fluid levels throughout small bowel, including in an enteroenteric anastomosis in the left abdomen which is chronically aneurysmal. There is paucity of colonic gas. No evidence of pneumatosis or pneumoperitoneum.  Normal heart size and aortic contours. There is no pneumonia or edema. No effusion or pneumothorax. Calcified granulomas noted in the right lung. Vague density overlapping the anterior right third rib is most likely summation shadows.  Unchanged appearance of right hip prosthesis, with vertical appearing acetabular component.  There is ankylosing spondylitis with fusion throughout the lumbar spine and of the sacroiliac joints.  IMPRESSION: 1. Fluid levels within small bowel which could reflect enteritis or early/partial bowel obstruction. 2. Ankylosing spondylitis with extensive spinal fusion.   Electronically Signed   By: Jorje Guild M.D.   On: 04/09/2014 23:50    Microbiology: No results found for this or any previous visit (from the past 240 hour(s)).   Labs: Basic Metabolic Panel:  Recent Labs Lab 04/09/14 2123 04/10/14 0815 04/11/14 0450  NA 140 134* 138  K 4.3 3.8 3.9  CL 103 103 107  CO2 26 26 27   GLUCOSE 142* 134* 138*  BUN 23 18 11   CREATININE 1.00 0.84 0.80  CALCIUM 9.3 8.9 8.5   Liver  Function Tests:  Recent Labs Lab 04/09/14 2123 04/10/14 0815  AST 30 28  ALT 18 17  ALKPHOS 40 40  BILITOT 0.6 0.6  PROT 7.5 6.9  ALBUMIN 4.6 4.0    Recent Labs Lab 04/09/14 2123  LIPASE 32   No results for input(s): AMMONIA in the last 168 hours. CBC:  Recent Labs Lab 04/09/14 2123 04/10/14 0815 04/11/14 0450  WBC 9.7 7.0 5.7  NEUTROABS 7.7 5.1  --   HGB 13.3 12.7* 12.3*  HCT 41.2 37.7* 37.6*  MCV 94.1 93.3 93.1  PLT 300 256 229   Cardiac Enzymes: No results for input(s): CKTOTAL, CKMB, CKMBINDEX, TROPONINI in the last 168 hours. BNP: BNP (last 3 results) No results for input(s): PROBNP in the last 8760 hours. CBG:  Recent Labs Lab 04/10/14 1635 04/10/14 2130 04/11/14 0654 04/11/14 1156 04/11/14 1704  GLUCAP 133* 147* 125* 139* 132*       Signed:  Debbe Odea, MD [facility] 04/12/2014, 12:57 PM

## 2014-04-12 NOTE — Progress Notes (Signed)
Rn reviewed discharge instructions with patient. All questions answered.   Paperwork given.   NT walked with patient down to family car.

## 2014-04-12 NOTE — Progress Notes (Signed)
Patient ID: Steven [name], male   DOB: 04-05-1935, 79 y.o.   MRN: [id number]    Subjective: Abdomen remains a little "sore" but no pain, tolerating full liquids without nausea and has had several bowel movements yesterday.  Objective: Vital signs in last 24 hours: Temp:  [97.2 F (36.2 C)-98 F (36.7 C)] 98 F (36.7 C) (12/31 0510) Pulse Rate:  [55-59] 56 (12/31 0510) Resp:  [14-16] 16 (12/31 0510) BP: (112-134)/(56-63) 121/63 mmHg (12/31 0510) SpO2:  [98 %-99 %] 98 % (12/31 0510) Last BM Date: 2014-04-17  Intake/Output from previous day: 04-18-2023 0701 - 12/31 0700 In: 1750 [P.O.:840; I.V.:910] Out: 700 [Urine:700] Intake/Output this shift:    General appearance: alert, cooperative and no distress GI: normal findings: soft, non-tender and no significant distention  Lab Results:   Recent Labs  04/10/14 0815 Apr 17, 2014 0450  WBC 7.0 5.7  HGB 12.7* 12.3*  HCT 37.7* 37.6*  PLT 256 229   BMET  Recent Labs  04/10/14 0815 17-Apr-2014 0450  NA 134* 138  K 3.8 3.9  CL 103 107  CO2 26 27  GLUCOSE 134* 138*  BUN 18 11  CREATININE 0.84 0.80  CALCIUM 8.9 8.5     Studies/Results: Dg Abd 2 Views  Apr 17, 2014   CLINICAL DATA:  Follow-up small bowel obstruction. No nausea or vomiting.  EXAM: ABDOMEN - 2 VIEW  COMPARISON:  CT abdomen pelvis - 04/10/2014  FINDINGS: There is a paucity of bowel gas without definite evidence of obstruction. No pneumoperitoneum, pneumatosis or portal venous gas.  Post cholecystectomy.  Ill-defined opacities overlie the expected location of the superior poles of the bilateral kidneys, right greater than left an are favored to represent the renal stones demonstrated on recent performed abdominal CT.  Additional ill-defined of opacities overlying the left mid abdomen are favored to represent ingested radiopaque debris.  Post right total hip replacement, incompletely evaluated, though the acetabular cup again demonstrates an exaggerated vertical line. Stigmata of  ankylosing spondylitis including fusion of the bilateral SI joints.  Limited visualization the lower thorax is unremarkable.  IMPRESSION: 1. Paucity of bowel gas without definite evidence of obstruction. 2. Bilateral nephrolithiasis, as demonstrated on recently performed abdominal CT. 3. Stigmata of ankylosing spondylitis.   Electronically Signed   By: Sandi Mariscal M.D.   On: April 17, 2014 08:09    Anti-infectives: Anti-infectives    None      Assessment/Plan: Episodes of small bowel obstruction that appears clinically resolved. Diet is advanced to regular. I believe he could be discharged.     LOS: 3 days    Ayodele Sangalang T 04/12/2014

## 2014-04-18 DIAGNOSIS — T63451D Toxic effect of venom of hornets, accidental (unintentional), subsequent encounter: Secondary | ICD-10-CM | POA: Diagnosis not present

## 2014-04-18 DIAGNOSIS — T63441D Toxic effect of venom of bees, accidental (unintentional), subsequent encounter: Secondary | ICD-10-CM | POA: Diagnosis not present

## 2014-04-18 DIAGNOSIS — T63461D Toxic effect of venom of wasps, accidental (unintentional), subsequent encounter: Secondary | ICD-10-CM | POA: Diagnosis not present

## 2014-04-19 DIAGNOSIS — L570 Actinic keratosis: Secondary | ICD-10-CM | POA: Diagnosis not present

## 2014-04-19 DIAGNOSIS — K5669 Other intestinal obstruction: Secondary | ICD-10-CM | POA: Diagnosis not present

## 2014-04-23 DIAGNOSIS — J3081 Allergic rhinitis due to animal (cat) (dog) hair and dander: Secondary | ICD-10-CM | POA: Diagnosis not present

## 2014-04-23 DIAGNOSIS — J3089 Other allergic rhinitis: Secondary | ICD-10-CM | POA: Diagnosis not present

## 2014-04-23 DIAGNOSIS — J301 Allergic rhinitis due to pollen: Secondary | ICD-10-CM | POA: Diagnosis not present

## 2014-05-01 DIAGNOSIS — M488X9 Other specified spondylopathies, site unspecified: Secondary | ICD-10-CM | POA: Diagnosis not present

## 2014-05-01 DIAGNOSIS — J301 Allergic rhinitis due to pollen: Secondary | ICD-10-CM | POA: Diagnosis not present

## 2014-05-01 DIAGNOSIS — J3089 Other allergic rhinitis: Secondary | ICD-10-CM | POA: Diagnosis not present

## 2014-05-01 DIAGNOSIS — J3081 Allergic rhinitis due to animal (cat) (dog) hair and dander: Secondary | ICD-10-CM | POA: Diagnosis not present

## 2014-05-10 DIAGNOSIS — J3081 Allergic rhinitis due to animal (cat) (dog) hair and dander: Secondary | ICD-10-CM | POA: Diagnosis not present

## 2014-05-10 DIAGNOSIS — J3089 Other allergic rhinitis: Secondary | ICD-10-CM | POA: Diagnosis not present

## 2014-05-10 DIAGNOSIS — J301 Allergic rhinitis due to pollen: Secondary | ICD-10-CM | POA: Diagnosis not present

## 2014-05-14 DIAGNOSIS — T63441D Toxic effect of venom of bees, accidental (unintentional), subsequent encounter: Secondary | ICD-10-CM | POA: Diagnosis not present

## 2014-05-14 DIAGNOSIS — T63451D Toxic effect of venom of hornets, accidental (unintentional), subsequent encounter: Secondary | ICD-10-CM | POA: Diagnosis not present

## 2014-05-14 DIAGNOSIS — T63461D Toxic effect of venom of wasps, accidental (unintentional), subsequent encounter: Secondary | ICD-10-CM | POA: Diagnosis not present

## 2014-05-21 DIAGNOSIS — J3081 Allergic rhinitis due to animal (cat) (dog) hair and dander: Secondary | ICD-10-CM | POA: Diagnosis not present

## 2014-05-21 DIAGNOSIS — J3089 Other allergic rhinitis: Secondary | ICD-10-CM | POA: Diagnosis not present

## 2014-05-21 DIAGNOSIS — J301 Allergic rhinitis due to pollen: Secondary | ICD-10-CM | POA: Diagnosis not present

## 2014-06-04 DIAGNOSIS — J3081 Allergic rhinitis due to animal (cat) (dog) hair and dander: Secondary | ICD-10-CM | POA: Diagnosis not present

## 2014-06-04 DIAGNOSIS — J3089 Other allergic rhinitis: Secondary | ICD-10-CM | POA: Diagnosis not present

## 2014-06-04 DIAGNOSIS — J301 Allergic rhinitis due to pollen: Secondary | ICD-10-CM | POA: Diagnosis not present

## 2014-06-11 DIAGNOSIS — J3089 Other allergic rhinitis: Secondary | ICD-10-CM | POA: Diagnosis not present

## 2014-06-11 DIAGNOSIS — J301 Allergic rhinitis due to pollen: Secondary | ICD-10-CM | POA: Diagnosis not present

## 2014-06-11 DIAGNOSIS — J3081 Allergic rhinitis due to animal (cat) (dog) hair and dander: Secondary | ICD-10-CM | POA: Diagnosis not present

## 2014-06-13 DIAGNOSIS — T63441D Toxic effect of venom of bees, accidental (unintentional), subsequent encounter: Secondary | ICD-10-CM | POA: Diagnosis not present

## 2014-06-13 DIAGNOSIS — T63451D Toxic effect of venom of hornets, accidental (unintentional), subsequent encounter: Secondary | ICD-10-CM | POA: Diagnosis not present

## 2014-06-13 DIAGNOSIS — T63461D Toxic effect of venom of wasps, accidental (unintentional), subsequent encounter: Secondary | ICD-10-CM | POA: Diagnosis not present

## 2014-06-18 DIAGNOSIS — J3089 Other allergic rhinitis: Secondary | ICD-10-CM | POA: Diagnosis not present

## 2014-06-18 DIAGNOSIS — J301 Allergic rhinitis due to pollen: Secondary | ICD-10-CM | POA: Diagnosis not present

## 2014-06-18 DIAGNOSIS — J3081 Allergic rhinitis due to animal (cat) (dog) hair and dander: Secondary | ICD-10-CM | POA: Diagnosis not present

## 2014-06-25 DIAGNOSIS — J3089 Other allergic rhinitis: Secondary | ICD-10-CM | POA: Diagnosis not present

## 2014-06-25 DIAGNOSIS — J301 Allergic rhinitis due to pollen: Secondary | ICD-10-CM | POA: Diagnosis not present

## 2014-06-25 DIAGNOSIS — J3081 Allergic rhinitis due to animal (cat) (dog) hair and dander: Secondary | ICD-10-CM | POA: Diagnosis not present

## 2014-07-04 DIAGNOSIS — J301 Allergic rhinitis due to pollen: Secondary | ICD-10-CM | POA: Diagnosis not present

## 2014-07-04 DIAGNOSIS — J3089 Other allergic rhinitis: Secondary | ICD-10-CM | POA: Diagnosis not present

## 2014-07-04 DIAGNOSIS — J3081 Allergic rhinitis due to animal (cat) (dog) hair and dander: Secondary | ICD-10-CM | POA: Diagnosis not present

## 2014-07-09 DIAGNOSIS — T63451D Toxic effect of venom of hornets, accidental (unintentional), subsequent encounter: Secondary | ICD-10-CM | POA: Diagnosis not present

## 2014-07-09 DIAGNOSIS — T63461D Toxic effect of venom of wasps, accidental (unintentional), subsequent encounter: Secondary | ICD-10-CM | POA: Diagnosis not present

## 2014-07-09 DIAGNOSIS — T63441D Toxic effect of venom of bees, accidental (unintentional), subsequent encounter: Secondary | ICD-10-CM | POA: Diagnosis not present

## 2014-07-17 DIAGNOSIS — J3089 Other allergic rhinitis: Secondary | ICD-10-CM | POA: Diagnosis not present

## 2014-07-17 DIAGNOSIS — J301 Allergic rhinitis due to pollen: Secondary | ICD-10-CM | POA: Diagnosis not present

## 2014-07-17 DIAGNOSIS — J3081 Allergic rhinitis due to animal (cat) (dog) hair and dander: Secondary | ICD-10-CM | POA: Diagnosis not present

## 2014-07-31 DIAGNOSIS — J3089 Other allergic rhinitis: Secondary | ICD-10-CM | POA: Diagnosis not present

## 2014-07-31 DIAGNOSIS — J3081 Allergic rhinitis due to animal (cat) (dog) hair and dander: Secondary | ICD-10-CM | POA: Diagnosis not present

## 2014-07-31 DIAGNOSIS — J301 Allergic rhinitis due to pollen: Secondary | ICD-10-CM | POA: Diagnosis not present

## 2014-08-02 DIAGNOSIS — J3081 Allergic rhinitis due to animal (cat) (dog) hair and dander: Secondary | ICD-10-CM | POA: Diagnosis not present

## 2014-08-02 DIAGNOSIS — J3089 Other allergic rhinitis: Secondary | ICD-10-CM | POA: Diagnosis not present

## 2014-08-02 DIAGNOSIS — J301 Allergic rhinitis due to pollen: Secondary | ICD-10-CM | POA: Diagnosis not present

## 2014-08-03 NOTE — Discharge Summary (Signed)
PATIENT NAME:  Steven [name], Steven [name] MR#:  [id number] DATE OF BIRTH:  09-25-1934  DATE OF ADMISSION:  04/19/2012 DATE OF DISCHARGE:  04/21/2012  PRINCIPAL DIAGNOSIS:  Partial small bowel obstruction.   OTHER DIAGNOSES:  Ankylosing spondylitis, depression, dyslipidemia, history of open right colectomy 20 years ago for abscess with ostomy and subsequent ostomy takedown, history of exploratory laparotomy for "twisted intestine" status post open cholecystectomy, history of a previous episode of partial small bowel obstruction that was treated nonoperatively.   HOSPITAL COURSE:  The patient was admitted to the hospital and underwent nasogastric suction initially, but the output was minimal and clear, and it was discontinued, and as his Vesicare was discontinued, he had multiple liquid bowel movements, and his diet was advanced and he was tolerating a regular diet. Repeat x-rays showed significant gas within the abdomen, but no true bowel obstruction or loops of dilated small intestine. He desired discharge and plan was to have him discontinue Vesicare indefinitely, and follow up with either myself or Dr. Pat Patrick in 2 to 4 weeks, at which time we will schedule an outpatient enteroclysis or small bowel follow-through. He was to call the office in the interim for any other gastrointestinal problems.     ____________________________ Consuela Mimes, MD wfm:dm D: 04/21/2012 19:58:00 ET T: 04/21/2012 21:57:39 ET JOB#: [id number]  cc: Consuela Mimes, MD, <Dictator> Consuela Mimes MD ELECTRONICALLY SIGNED 04/27/2012 9:04

## 2014-08-03 NOTE — H&P (Signed)
PATIENT NAME:  Steven [name], Steven [name] MR#:  [id number] DATE OF BIRTH:  24-Mar-1935  DATE OF ADMISSION:  04/19/2012  HISTORY OF PRESENT ILLNESS: Steven [name] is a 80 year old white male who began experiencing constant lower abdominal pain yesterday and has had previous bowel obstructions and voluntarily significantly diminished his diet. He only had an orange and a little bit of soup to eat. First, he experienced significant esophageal regurgitation symptoms and abdominal fullness, but then ultimately vomited 1 time. Following that, he had a small bowel movement, and he had had a normal bowel movement earlier in the day. He has also continued to pass flatus. His abdominal pain has resolved significantly.   PAST MEDICAL HISTORY: Ankylosing spondylitis, depression, dyslipidemia.   PAST SURGICAL HISTORY: Partial (right) open colectomy 20 years ago for an abscess, with an ostomy and subsequent ostomy takedown. The patient has also had an exploratory laparotomy for a "twisted intestine" and an open cholecystectomy. He has had a subsequent episode of partial small bowel obstruction that was treated nonoperatively.   MEDICATIONS: Celebrex 200 mg b.i.d., citalopram 40 mg, fenofibrate 145 mg daily, fluticasone inhaled, simvastatin 40 mg at bedtime, VESIcare 5 mg daily, multivitamin daily.   ALLERGIES: INDOCIN.   REVIEW OF SYSTEMS: Negative for 10 systems except as mentioned above in the history of present illness.   SOCIAL HISTORY: The patient does not smoke, or drink alcohol, or use illicit drugs.   FAMILY HISTORY: Noncontributory.   PHYSICAL EXAMINATION: GENERAL: A pleasant elderly white male in no apparent distress. Height 5 feet 6 inches, weight 150 pounds, BMI 24.2.  VITAL SIGNS: Temperature 99.2, pulse 92, respirations 18, blood pressure 154/84, oxygen saturation 97% on room air at rest.  HEENT: Pupils are equally round and reactive to light. Extraocular movements are intact. Sclerae are anicteric. Oropharynx is  clear. Mucous membranes are moist. Hearing intact to voice.  NECK: Supple with no tracheal deviation or jugular venous distention.  HEART: Regular rate and rhythm with no murmurs or rubs.  LUNGS: Clear to auscultation with normal respiratory effort bilaterally.  ABDOMEN: Soft but distended (patient says at his baseline), with mild tympany and no tenderness. There is a long midline scar as well as a right subcostal scar.  EXTREMITIES: No edema, with normal capillary refill bilaterally.  NEUROLOGIC: Cranial nerves II through XII, motor and sensation grossly intact.  PSYCHIATRIC: Alert and oriented x 4. Appropriate affect.   LABORATORY, DIAGNOSTIC AND RADIOLOGICAL DATA:  CBC, electrolytes, hepatic profile are all normal. Urinalysis normal.   CT scan of the abdomen and pelvis shows a right total hip prosthesis and fairly significant stomach contents and gas within the stomach with some mildly dilated small intestine which appears most prominent in the right upper quadrant near the anastomosis. There is stool and gas throughout the remaining colon, including the transverse, descending, sigmoid colons and rectum. There is no free fluid or obvious inflammation.   ASSESSMENT: Partial small bowel obstruction, likely adhesive, but possibly anastomotic.   PLAN: Admit to the hospital for nasogastric suction, IV fluid hydration, and observation. The patient will have his VESIcare discontinued as this is contributing to his bowel dysmotility.  ____________________________ Consuela Mimes, MD wfm:cb D: 04/19/2012 05:25:37 ET T: 04/19/2012 11:12:22 ET JOB#: [id number]  cc: Consuela Mimes, MD, <Dictator> Consuela Mimes MD ELECTRONICALLY SIGNED 04/19/2012 20:17

## 2014-08-06 DIAGNOSIS — J3081 Allergic rhinitis due to animal (cat) (dog) hair and dander: Secondary | ICD-10-CM | POA: Diagnosis not present

## 2014-08-06 DIAGNOSIS — J3089 Other allergic rhinitis: Secondary | ICD-10-CM | POA: Diagnosis not present

## 2014-08-06 DIAGNOSIS — J301 Allergic rhinitis due to pollen: Secondary | ICD-10-CM | POA: Diagnosis not present

## 2014-08-08 DIAGNOSIS — T63451D Toxic effect of venom of hornets, accidental (unintentional), subsequent encounter: Secondary | ICD-10-CM | POA: Diagnosis not present

## 2014-08-08 DIAGNOSIS — T63441D Toxic effect of venom of bees, accidental (unintentional), subsequent encounter: Secondary | ICD-10-CM | POA: Diagnosis not present

## 2014-08-08 DIAGNOSIS — T63461D Toxic effect of venom of wasps, accidental (unintentional), subsequent encounter: Secondary | ICD-10-CM | POA: Diagnosis not present

## 2014-08-10 DIAGNOSIS — J3081 Allergic rhinitis due to animal (cat) (dog) hair and dander: Secondary | ICD-10-CM | POA: Diagnosis not present

## 2014-08-10 DIAGNOSIS — J3089 Other allergic rhinitis: Secondary | ICD-10-CM | POA: Diagnosis not present

## 2014-08-10 DIAGNOSIS — J301 Allergic rhinitis due to pollen: Secondary | ICD-10-CM | POA: Diagnosis not present

## 2014-08-13 DIAGNOSIS — J301 Allergic rhinitis due to pollen: Secondary | ICD-10-CM | POA: Diagnosis not present

## 2014-08-13 DIAGNOSIS — J3081 Allergic rhinitis due to animal (cat) (dog) hair and dander: Secondary | ICD-10-CM | POA: Diagnosis not present

## 2014-08-13 DIAGNOSIS — J3089 Other allergic rhinitis: Secondary | ICD-10-CM | POA: Diagnosis not present

## 2014-08-17 DIAGNOSIS — J3089 Other allergic rhinitis: Secondary | ICD-10-CM | POA: Diagnosis not present

## 2014-08-21 DIAGNOSIS — J301 Allergic rhinitis due to pollen: Secondary | ICD-10-CM | POA: Diagnosis not present

## 2014-08-21 DIAGNOSIS — J3089 Other allergic rhinitis: Secondary | ICD-10-CM | POA: Diagnosis not present

## 2014-08-27 DIAGNOSIS — J3081 Allergic rhinitis due to animal (cat) (dog) hair and dander: Secondary | ICD-10-CM | POA: Diagnosis not present

## 2014-08-27 DIAGNOSIS — J301 Allergic rhinitis due to pollen: Secondary | ICD-10-CM | POA: Diagnosis not present

## 2014-08-27 DIAGNOSIS — J3089 Other allergic rhinitis: Secondary | ICD-10-CM | POA: Diagnosis not present

## 2014-09-11 DIAGNOSIS — T63441D Toxic effect of venom of bees, accidental (unintentional), subsequent encounter: Secondary | ICD-10-CM | POA: Diagnosis not present

## 2014-09-11 DIAGNOSIS — T63461D Toxic effect of venom of wasps, accidental (unintentional), subsequent encounter: Secondary | ICD-10-CM | POA: Diagnosis not present

## 2014-09-11 DIAGNOSIS — T63451D Toxic effect of venom of hornets, accidental (unintentional), subsequent encounter: Secondary | ICD-10-CM | POA: Diagnosis not present

## 2014-09-14 DIAGNOSIS — J3089 Other allergic rhinitis: Secondary | ICD-10-CM | POA: Diagnosis not present

## 2014-09-14 DIAGNOSIS — E559 Vitamin D deficiency, unspecified: Secondary | ICD-10-CM | POA: Diagnosis not present

## 2014-09-14 DIAGNOSIS — Z125 Encounter for screening for malignant neoplasm of prostate: Secondary | ICD-10-CM | POA: Diagnosis not present

## 2014-09-14 DIAGNOSIS — J3081 Allergic rhinitis due to animal (cat) (dog) hair and dander: Secondary | ICD-10-CM | POA: Diagnosis not present

## 2014-09-14 DIAGNOSIS — E785 Hyperlipidemia, unspecified: Secondary | ICD-10-CM | POA: Diagnosis not present

## 2014-09-14 DIAGNOSIS — J301 Allergic rhinitis due to pollen: Secondary | ICD-10-CM | POA: Diagnosis not present

## 2014-09-14 DIAGNOSIS — E1121 Type 2 diabetes mellitus with diabetic nephropathy: Secondary | ICD-10-CM | POA: Diagnosis not present

## 2014-09-14 DIAGNOSIS — Z23 Encounter for immunization: Secondary | ICD-10-CM | POA: Diagnosis not present

## 2014-09-14 DIAGNOSIS — E291 Testicular hypofunction: Secondary | ICD-10-CM | POA: Diagnosis not present

## 2014-09-14 DIAGNOSIS — Z1389 Encounter for screening for other disorder: Secondary | ICD-10-CM | POA: Diagnosis not present

## 2014-09-14 DIAGNOSIS — Z0000 Encounter for general adult medical examination without abnormal findings: Secondary | ICD-10-CM | POA: Diagnosis not present

## 2014-09-17 DIAGNOSIS — J3081 Allergic rhinitis due to animal (cat) (dog) hair and dander: Secondary | ICD-10-CM | POA: Diagnosis not present

## 2014-09-17 DIAGNOSIS — J301 Allergic rhinitis due to pollen: Secondary | ICD-10-CM | POA: Diagnosis not present

## 2014-09-17 DIAGNOSIS — J3089 Other allergic rhinitis: Secondary | ICD-10-CM | POA: Diagnosis not present

## 2014-09-18 DIAGNOSIS — H25013 Cortical age-related cataract, bilateral: Secondary | ICD-10-CM | POA: Diagnosis not present

## 2014-09-18 DIAGNOSIS — H1859 Other hereditary corneal dystrophies: Secondary | ICD-10-CM | POA: Diagnosis not present

## 2014-09-26 DIAGNOSIS — M810 Age-related osteoporosis without current pathological fracture: Secondary | ICD-10-CM | POA: Diagnosis not present

## 2014-09-26 DIAGNOSIS — N182 Chronic kidney disease, stage 2 (mild): Secondary | ICD-10-CM | POA: Diagnosis not present

## 2014-09-26 DIAGNOSIS — J301 Allergic rhinitis due to pollen: Secondary | ICD-10-CM | POA: Diagnosis not present

## 2014-09-26 DIAGNOSIS — J3089 Other allergic rhinitis: Secondary | ICD-10-CM | POA: Diagnosis not present

## 2014-09-26 DIAGNOSIS — L821 Other seborrheic keratosis: Secondary | ICD-10-CM | POA: Diagnosis not present

## 2014-09-26 DIAGNOSIS — E785 Hyperlipidemia, unspecified: Secondary | ICD-10-CM | POA: Diagnosis not present

## 2014-09-26 DIAGNOSIS — E559 Vitamin D deficiency, unspecified: Secondary | ICD-10-CM | POA: Diagnosis not present

## 2014-09-26 DIAGNOSIS — E291 Testicular hypofunction: Secondary | ICD-10-CM | POA: Diagnosis not present

## 2014-10-01 DIAGNOSIS — J301 Allergic rhinitis due to pollen: Secondary | ICD-10-CM | POA: Diagnosis not present

## 2014-10-01 DIAGNOSIS — J3081 Allergic rhinitis due to animal (cat) (dog) hair and dander: Secondary | ICD-10-CM | POA: Diagnosis not present

## 2014-10-01 DIAGNOSIS — J3089 Other allergic rhinitis: Secondary | ICD-10-CM | POA: Diagnosis not present

## 2014-10-02 DIAGNOSIS — M459 Ankylosing spondylitis of unspecified sites in spine: Secondary | ICD-10-CM | POA: Diagnosis not present

## 2014-10-02 DIAGNOSIS — J302 Other seasonal allergic rhinitis: Secondary | ICD-10-CM | POA: Diagnosis not present

## 2014-10-02 DIAGNOSIS — R040 Epistaxis: Secondary | ICD-10-CM | POA: Diagnosis not present

## 2014-10-02 DIAGNOSIS — J342 Deviated nasal septum: Secondary | ICD-10-CM | POA: Diagnosis not present

## 2014-10-08 DIAGNOSIS — J3089 Other allergic rhinitis: Secondary | ICD-10-CM | POA: Diagnosis not present

## 2014-10-08 DIAGNOSIS — J301 Allergic rhinitis due to pollen: Secondary | ICD-10-CM | POA: Diagnosis not present

## 2014-10-16 DIAGNOSIS — J301 Allergic rhinitis due to pollen: Secondary | ICD-10-CM | POA: Diagnosis not present

## 2014-10-16 DIAGNOSIS — J3089 Other allergic rhinitis: Secondary | ICD-10-CM | POA: Diagnosis not present

## 2014-10-16 DIAGNOSIS — J3081 Allergic rhinitis due to animal (cat) (dog) hair and dander: Secondary | ICD-10-CM | POA: Diagnosis not present

## 2014-10-19 DIAGNOSIS — T63451D Toxic effect of venom of hornets, accidental (unintentional), subsequent encounter: Secondary | ICD-10-CM | POA: Diagnosis not present

## 2014-10-22 DIAGNOSIS — J3081 Allergic rhinitis due to animal (cat) (dog) hair and dander: Secondary | ICD-10-CM | POA: Diagnosis not present

## 2014-10-22 DIAGNOSIS — J3089 Other allergic rhinitis: Secondary | ICD-10-CM | POA: Diagnosis not present

## 2014-10-22 DIAGNOSIS — J301 Allergic rhinitis due to pollen: Secondary | ICD-10-CM | POA: Diagnosis not present

## 2014-10-29 DIAGNOSIS — J3089 Other allergic rhinitis: Secondary | ICD-10-CM | POA: Diagnosis not present

## 2014-10-29 DIAGNOSIS — J301 Allergic rhinitis due to pollen: Secondary | ICD-10-CM | POA: Diagnosis not present

## 2014-10-29 DIAGNOSIS — J3081 Allergic rhinitis due to animal (cat) (dog) hair and dander: Secondary | ICD-10-CM | POA: Diagnosis not present

## 2014-10-30 DIAGNOSIS — M488X9 Other specified spondylopathies, site unspecified: Secondary | ICD-10-CM | POA: Diagnosis not present

## 2014-11-05 DIAGNOSIS — J301 Allergic rhinitis due to pollen: Secondary | ICD-10-CM | POA: Diagnosis not present

## 2014-11-05 DIAGNOSIS — J3089 Other allergic rhinitis: Secondary | ICD-10-CM | POA: Diagnosis not present

## 2014-11-05 DIAGNOSIS — J3081 Allergic rhinitis due to animal (cat) (dog) hair and dander: Secondary | ICD-10-CM | POA: Diagnosis not present

## 2014-11-12 DIAGNOSIS — J3081 Allergic rhinitis due to animal (cat) (dog) hair and dander: Secondary | ICD-10-CM | POA: Diagnosis not present

## 2014-11-12 DIAGNOSIS — J3089 Other allergic rhinitis: Secondary | ICD-10-CM | POA: Diagnosis not present

## 2014-11-12 DIAGNOSIS — J301 Allergic rhinitis due to pollen: Secondary | ICD-10-CM | POA: Diagnosis not present

## 2014-11-19 DIAGNOSIS — T63461D Toxic effect of venom of wasps, accidental (unintentional), subsequent encounter: Secondary | ICD-10-CM | POA: Diagnosis not present

## 2014-11-19 DIAGNOSIS — T63441D Toxic effect of venom of bees, accidental (unintentional), subsequent encounter: Secondary | ICD-10-CM | POA: Diagnosis not present

## 2014-11-19 DIAGNOSIS — T63451D Toxic effect of venom of hornets, accidental (unintentional), subsequent encounter: Secondary | ICD-10-CM | POA: Diagnosis not present

## 2014-11-22 DIAGNOSIS — J3089 Other allergic rhinitis: Secondary | ICD-10-CM | POA: Diagnosis not present

## 2014-11-22 DIAGNOSIS — J301 Allergic rhinitis due to pollen: Secondary | ICD-10-CM | POA: Diagnosis not present

## 2014-11-22 DIAGNOSIS — J3081 Allergic rhinitis due to animal (cat) (dog) hair and dander: Secondary | ICD-10-CM | POA: Diagnosis not present

## 2014-11-26 DIAGNOSIS — J3081 Allergic rhinitis due to animal (cat) (dog) hair and dander: Secondary | ICD-10-CM | POA: Diagnosis not present

## 2014-11-26 DIAGNOSIS — J3089 Other allergic rhinitis: Secondary | ICD-10-CM | POA: Diagnosis not present

## 2014-11-26 DIAGNOSIS — J301 Allergic rhinitis due to pollen: Secondary | ICD-10-CM | POA: Diagnosis not present

## 2014-12-03 DIAGNOSIS — J3089 Other allergic rhinitis: Secondary | ICD-10-CM | POA: Diagnosis not present

## 2014-12-03 DIAGNOSIS — J301 Allergic rhinitis due to pollen: Secondary | ICD-10-CM | POA: Diagnosis not present

## 2014-12-03 DIAGNOSIS — J3081 Allergic rhinitis due to animal (cat) (dog) hair and dander: Secondary | ICD-10-CM | POA: Diagnosis not present

## 2014-12-12 DIAGNOSIS — J3081 Allergic rhinitis due to animal (cat) (dog) hair and dander: Secondary | ICD-10-CM | POA: Diagnosis not present

## 2014-12-12 DIAGNOSIS — J3089 Other allergic rhinitis: Secondary | ICD-10-CM | POA: Diagnosis not present

## 2014-12-12 DIAGNOSIS — J301 Allergic rhinitis due to pollen: Secondary | ICD-10-CM | POA: Diagnosis not present

## 2014-12-20 DIAGNOSIS — T63461D Toxic effect of venom of wasps, accidental (unintentional), subsequent encounter: Secondary | ICD-10-CM | POA: Diagnosis not present

## 2014-12-20 DIAGNOSIS — E559 Vitamin D deficiency, unspecified: Secondary | ICD-10-CM | POA: Diagnosis not present

## 2014-12-20 DIAGNOSIS — E785 Hyperlipidemia, unspecified: Secondary | ICD-10-CM | POA: Diagnosis not present

## 2014-12-20 DIAGNOSIS — T63441D Toxic effect of venom of bees, accidental (unintentional), subsequent encounter: Secondary | ICD-10-CM | POA: Diagnosis not present

## 2014-12-20 DIAGNOSIS — E291 Testicular hypofunction: Secondary | ICD-10-CM | POA: Diagnosis not present

## 2014-12-20 DIAGNOSIS — E1122 Type 2 diabetes mellitus with diabetic chronic kidney disease: Secondary | ICD-10-CM | POA: Diagnosis not present

## 2014-12-20 DIAGNOSIS — T63451D Toxic effect of venom of hornets, accidental (unintentional), subsequent encounter: Secondary | ICD-10-CM | POA: Diagnosis not present

## 2014-12-24 DIAGNOSIS — J3089 Other allergic rhinitis: Secondary | ICD-10-CM | POA: Diagnosis not present

## 2014-12-24 DIAGNOSIS — J3081 Allergic rhinitis due to animal (cat) (dog) hair and dander: Secondary | ICD-10-CM | POA: Diagnosis not present

## 2014-12-24 DIAGNOSIS — J301 Allergic rhinitis due to pollen: Secondary | ICD-10-CM | POA: Diagnosis not present

## 2015-01-01 DIAGNOSIS — J301 Allergic rhinitis due to pollen: Secondary | ICD-10-CM | POA: Diagnosis not present

## 2015-01-01 DIAGNOSIS — J3089 Other allergic rhinitis: Secondary | ICD-10-CM | POA: Diagnosis not present

## 2015-01-01 DIAGNOSIS — J3081 Allergic rhinitis due to animal (cat) (dog) hair and dander: Secondary | ICD-10-CM | POA: Diagnosis not present

## 2015-01-01 DIAGNOSIS — E559 Vitamin D deficiency, unspecified: Secondary | ICD-10-CM | POA: Diagnosis not present

## 2015-01-01 DIAGNOSIS — E1122 Type 2 diabetes mellitus with diabetic chronic kidney disease: Secondary | ICD-10-CM | POA: Diagnosis not present

## 2015-01-01 DIAGNOSIS — E785 Hyperlipidemia, unspecified: Secondary | ICD-10-CM | POA: Diagnosis not present

## 2015-01-03 DIAGNOSIS — J3089 Other allergic rhinitis: Secondary | ICD-10-CM | POA: Diagnosis not present

## 2015-01-03 DIAGNOSIS — J3081 Allergic rhinitis due to animal (cat) (dog) hair and dander: Secondary | ICD-10-CM | POA: Diagnosis not present

## 2015-01-03 DIAGNOSIS — J301 Allergic rhinitis due to pollen: Secondary | ICD-10-CM | POA: Diagnosis not present

## 2015-01-10 DIAGNOSIS — J3089 Other allergic rhinitis: Secondary | ICD-10-CM | POA: Diagnosis not present

## 2015-01-10 DIAGNOSIS — J301 Allergic rhinitis due to pollen: Secondary | ICD-10-CM | POA: Diagnosis not present

## 2015-01-10 DIAGNOSIS — J3081 Allergic rhinitis due to animal (cat) (dog) hair and dander: Secondary | ICD-10-CM | POA: Diagnosis not present

## 2015-01-17 DIAGNOSIS — J3081 Allergic rhinitis due to animal (cat) (dog) hair and dander: Secondary | ICD-10-CM | POA: Diagnosis not present

## 2015-01-17 DIAGNOSIS — J3089 Other allergic rhinitis: Secondary | ICD-10-CM | POA: Diagnosis not present

## 2015-01-17 DIAGNOSIS — J301 Allergic rhinitis due to pollen: Secondary | ICD-10-CM | POA: Diagnosis not present

## 2015-01-17 DIAGNOSIS — T63441A Toxic effect of venom of bees, accidental (unintentional), initial encounter: Secondary | ICD-10-CM | POA: Diagnosis not present

## 2015-01-22 DIAGNOSIS — J3081 Allergic rhinitis due to animal (cat) (dog) hair and dander: Secondary | ICD-10-CM | POA: Diagnosis not present

## 2015-01-22 DIAGNOSIS — J301 Allergic rhinitis due to pollen: Secondary | ICD-10-CM | POA: Diagnosis not present

## 2015-01-22 DIAGNOSIS — J3089 Other allergic rhinitis: Secondary | ICD-10-CM | POA: Diagnosis not present

## 2015-01-31 DIAGNOSIS — J3081 Allergic rhinitis due to animal (cat) (dog) hair and dander: Secondary | ICD-10-CM | POA: Diagnosis not present

## 2015-01-31 DIAGNOSIS — J3089 Other allergic rhinitis: Secondary | ICD-10-CM | POA: Diagnosis not present

## 2015-01-31 DIAGNOSIS — T63451D Toxic effect of venom of hornets, accidental (unintentional), subsequent encounter: Secondary | ICD-10-CM | POA: Diagnosis not present

## 2015-01-31 DIAGNOSIS — J301 Allergic rhinitis due to pollen: Secondary | ICD-10-CM | POA: Diagnosis not present

## 2015-02-04 DIAGNOSIS — J3089 Other allergic rhinitis: Secondary | ICD-10-CM | POA: Diagnosis not present

## 2015-02-04 DIAGNOSIS — J3081 Allergic rhinitis due to animal (cat) (dog) hair and dander: Secondary | ICD-10-CM | POA: Diagnosis not present

## 2015-02-04 DIAGNOSIS — J301 Allergic rhinitis due to pollen: Secondary | ICD-10-CM | POA: Diagnosis not present

## 2015-02-07 DIAGNOSIS — Z23 Encounter for immunization: Secondary | ICD-10-CM | POA: Diagnosis not present

## 2015-02-11 DIAGNOSIS — J3089 Other allergic rhinitis: Secondary | ICD-10-CM | POA: Diagnosis not present

## 2015-02-11 DIAGNOSIS — J3081 Allergic rhinitis due to animal (cat) (dog) hair and dander: Secondary | ICD-10-CM | POA: Diagnosis not present

## 2015-02-11 DIAGNOSIS — J301 Allergic rhinitis due to pollen: Secondary | ICD-10-CM | POA: Diagnosis not present

## 2015-02-18 DIAGNOSIS — J3089 Other allergic rhinitis: Secondary | ICD-10-CM | POA: Diagnosis not present

## 2015-02-18 DIAGNOSIS — J3081 Allergic rhinitis due to animal (cat) (dog) hair and dander: Secondary | ICD-10-CM | POA: Diagnosis not present

## 2015-02-18 DIAGNOSIS — J301 Allergic rhinitis due to pollen: Secondary | ICD-10-CM | POA: Diagnosis not present

## 2015-02-25 DIAGNOSIS — J3089 Other allergic rhinitis: Secondary | ICD-10-CM | POA: Diagnosis not present

## 2015-02-25 DIAGNOSIS — J301 Allergic rhinitis due to pollen: Secondary | ICD-10-CM | POA: Diagnosis not present

## 2015-02-25 DIAGNOSIS — M7552 Bursitis of left shoulder: Secondary | ICD-10-CM | POA: Diagnosis not present

## 2015-02-25 DIAGNOSIS — Z791 Long term (current) use of non-steroidal anti-inflammatories (NSAID): Secondary | ICD-10-CM | POA: Diagnosis not present

## 2015-02-25 DIAGNOSIS — M459 Ankylosing spondylitis of unspecified sites in spine: Secondary | ICD-10-CM | POA: Diagnosis not present

## 2015-02-25 DIAGNOSIS — J3081 Allergic rhinitis due to animal (cat) (dog) hair and dander: Secondary | ICD-10-CM | POA: Diagnosis not present

## 2015-03-04 DIAGNOSIS — J3089 Other allergic rhinitis: Secondary | ICD-10-CM | POA: Diagnosis not present

## 2015-03-04 DIAGNOSIS — J301 Allergic rhinitis due to pollen: Secondary | ICD-10-CM | POA: Diagnosis not present

## 2015-03-04 DIAGNOSIS — J3081 Allergic rhinitis due to animal (cat) (dog) hair and dander: Secondary | ICD-10-CM | POA: Diagnosis not present

## 2015-03-11 DIAGNOSIS — J3081 Allergic rhinitis due to animal (cat) (dog) hair and dander: Secondary | ICD-10-CM | POA: Diagnosis not present

## 2015-03-11 DIAGNOSIS — J3089 Other allergic rhinitis: Secondary | ICD-10-CM | POA: Diagnosis not present

## 2015-03-11 DIAGNOSIS — J301 Allergic rhinitis due to pollen: Secondary | ICD-10-CM | POA: Diagnosis not present

## 2015-03-21 ENCOUNTER — Emergency Department (HOSPITAL_COMMUNITY): Payer: Medicare Other

## 2015-03-21 ENCOUNTER — Encounter (HOSPITAL_COMMUNITY): Payer: Self-pay | Admitting: Emergency Medicine

## 2015-03-21 ENCOUNTER — Inpatient Hospital Stay (HOSPITAL_COMMUNITY)
Admission: EM | Admit: 2015-03-21 | Discharge: 2015-03-27 | DRG: 390 | Disposition: A | Discharge status: Home / Self Care | Payer: Medicare Other | Attending: Surgery | Admitting: Surgery

## 2015-03-21 ENCOUNTER — Observation Stay (HOSPITAL_COMMUNITY): Payer: Medicare Other

## 2015-03-21 DIAGNOSIS — E860 Dehydration: Secondary | ICD-10-CM | POA: Diagnosis present

## 2015-03-21 DIAGNOSIS — R140 Abdominal distension (gaseous): Secondary | ICD-10-CM | POA: Diagnosis not present

## 2015-03-21 DIAGNOSIS — Z87442 Personal history of urinary calculi: Secondary | ICD-10-CM

## 2015-03-21 DIAGNOSIS — Z9049 Acquired absence of other specified parts of digestive tract: Secondary | ICD-10-CM

## 2015-03-21 DIAGNOSIS — K5669 Other intestinal obstruction: Secondary | ICD-10-CM | POA: Diagnosis not present

## 2015-03-21 DIAGNOSIS — K565 Intestinal adhesions [bands] with obstruction (postprocedural) (postinfection): Secondary | ICD-10-CM | POA: Diagnosis not present

## 2015-03-21 DIAGNOSIS — N200 Calculus of kidney: Secondary | ICD-10-CM | POA: Diagnosis not present

## 2015-03-21 DIAGNOSIS — Z4682 Encounter for fitting and adjustment of non-vascular catheter: Secondary | ICD-10-CM | POA: Diagnosis not present

## 2015-03-21 DIAGNOSIS — Z96641 Presence of right artificial hip joint: Secondary | ICD-10-CM | POA: Diagnosis present

## 2015-03-21 DIAGNOSIS — Z0189 Encounter for other specified special examinations: Secondary | ICD-10-CM

## 2015-03-21 DIAGNOSIS — R110 Nausea: Secondary | ICD-10-CM | POA: Diagnosis not present

## 2015-03-21 DIAGNOSIS — K56609 Unspecified intestinal obstruction, unspecified as to partial versus complete obstruction: Secondary | ICD-10-CM | POA: Diagnosis present

## 2015-03-21 DIAGNOSIS — K5660 Unspecified intestinal obstruction: Secondary | ICD-10-CM | POA: Diagnosis not present

## 2015-03-21 DIAGNOSIS — Z87891 Personal history of nicotine dependence: Secondary | ICD-10-CM | POA: Diagnosis not present

## 2015-03-21 LAB — CBC WITH DIFFERENTIAL/PLATELET
Basophils Absolute: 0 10*3/uL (ref 0.0–0.1)
Basophils Relative: 0 %
EOS PCT: 1 %
Eosinophils Absolute: 0.1 10*3/uL (ref 0.0–0.7)
HCT: 43.4 % (ref 39.0–52.0)
Hemoglobin: 14.1 g/dL (ref 13.0–17.0)
LYMPHS ABS: 0.6 10*3/uL — AB (ref 0.7–4.0)
LYMPHS PCT: 6 %
MCH: 27.7 pg (ref 26.0–34.0)
MCHC: 32.5 g/dL (ref 30.0–36.0)
MCV: 85.3 fL (ref 78.0–100.0)
MONO ABS: 0.9 10*3/uL (ref 0.1–1.0)
MONOS PCT: 8 %
Neutro Abs: 9.5 10*3/uL — ABNORMAL HIGH (ref 1.7–7.7)
Neutrophils Relative %: 85 %
PLATELETS: 342 10*3/uL (ref 150–400)
RBC: 5.09 MIL/uL (ref 4.22–5.81)
RDW: 14.3 % (ref 11.5–15.5)
WBC: 11.1 10*3/uL — ABNORMAL HIGH (ref 4.0–10.5)

## 2015-03-21 LAB — COMPREHENSIVE METABOLIC PANEL
ALK PHOS: 51 U/L (ref 38–126)
ALT: 18 U/L (ref 17–63)
ANION GAP: 12 (ref 5–15)
AST: 25 U/L (ref 15–41)
Albumin: 4.7 g/dL (ref 3.5–5.0)
BUN: 21 mg/dL — ABNORMAL HIGH (ref 6–20)
CALCIUM: 10.2 mg/dL (ref 8.9–10.3)
CHLORIDE: 98 mmol/L — AB (ref 101–111)
CO2: 27 mmol/L (ref 22–32)
Creatinine, Ser: 1.16 mg/dL (ref 0.61–1.24)
GFR, EST NON AFRICAN AMERICAN: 58 mL/min — AB (ref >60)
Glucose, Bld: 167 mg/dL — ABNORMAL HIGH (ref 65–99)
Potassium: 4.5 mmol/L (ref 3.5–5.1)
SODIUM: 137 mmol/L (ref 135–145)
Total Bilirubin: 1.1 mg/dL (ref 0.3–1.2)
Total Protein: 8.6 g/dL — ABNORMAL HIGH (ref 6.5–8.1)

## 2015-03-21 LAB — URINALYSIS, ROUTINE W REFLEX MICROSCOPIC
BILIRUBIN URINE: NEGATIVE
Glucose, UA: NEGATIVE mg/dL
HGB URINE DIPSTICK: NEGATIVE
KETONES UR: NEGATIVE mg/dL
Leukocytes, UA: NEGATIVE
NITRITE: NEGATIVE
PROTEIN: NEGATIVE mg/dL
SPECIFIC GRAVITY, URINE: 1.02 (ref 1.005–1.030)
pH: 6 (ref 5.0–8.0)

## 2015-03-21 LAB — I-STAT CHEM 8, ED
BUN: 22 mg/dL — ABNORMAL HIGH (ref 6–20)
CALCIUM ION: 1.11 mmol/L — AB (ref 1.13–1.30)
CREATININE: 1.1 mg/dL (ref 0.61–1.24)
Chloride: 100 mmol/L — ABNORMAL LOW (ref 101–111)
GLUCOSE: 162 mg/dL — AB (ref 65–99)
HCT: 49 % (ref 39.0–52.0)
HEMOGLOBIN: 16.7 g/dL (ref 13.0–17.0)
Potassium: 4.4 mmol/L (ref 3.5–5.1)
Sodium: 138 mmol/L (ref 135–145)
TCO2: 28 mmol/L (ref 0–100)

## 2015-03-21 LAB — LIPASE, BLOOD: LIPASE: 42 U/L (ref 11–51)

## 2015-03-21 MED ORDER — ACETAMINOPHEN 650 MG RE SUPP: 650.0000 mg | Freq: Four times a day (QID) | RECTAL | Status: DC | PRN

## 2015-03-21 MED ORDER — ACETAMINOPHEN 325 MG PO TABS: 650.0000 mg | ORAL_TABLET | Freq: Four times a day (QID) | ORAL | Status: DC | PRN

## 2015-03-21 MED ORDER — DIPHENHYDRAMINE HCL 50 MG/ML IJ SOLN: 25.0000 mg | Freq: Four times a day (QID) | INTRAMUSCULAR | Status: DC | PRN

## 2015-03-21 MED ORDER — CHLORHEXIDINE GLUCONATE 0.12 % MT SOLN
15.0000 mL | Freq: Two times a day (BID) | OROMUCOSAL | Status: DC
  Administered 2015-03-21 – 2015-03-23 (×3): 15 mL via OROMUCOSAL
  Filled 2015-03-21 (×14): qty 15

## 2015-03-21 MED ORDER — ONDANSETRON HCL 4 MG/2ML IJ SOLN
4.0000 mg | Freq: Once | INTRAMUSCULAR | Status: AC
  Administered 2015-03-21: 4 mg via INTRAVENOUS
  Filled 2015-03-21: qty 2

## 2015-03-21 MED ORDER — POTASSIUM CHLORIDE IN NACL 20-0.9 MEQ/L-% IV SOLN
INTRAVENOUS | Status: DC
  Administered 2015-03-21: 100 mL/h via INTRAVENOUS
  Administered 2015-03-21 – 2015-03-22 (×2): via INTRAVENOUS
  Administered 2015-03-22 – 2015-03-23 (×2): 100 mL/h via INTRAVENOUS
  Administered 2015-03-24 – 2015-03-27 (×4): via INTRAVENOUS
  Filled 2015-03-21 (×17): qty 1000

## 2015-03-21 MED ORDER — CETYLPYRIDINIUM CHLORIDE 0.05 % MT LIQD
7.0000 mL | Freq: Two times a day (BID) | OROMUCOSAL | Status: DC
  Administered 2015-03-21 – 2015-03-24 (×5): 7 mL via OROMUCOSAL

## 2015-03-21 MED ORDER — MORPHINE SULFATE (PF) 2 MG/ML IV SOLN
1.0000 mg | INTRAVENOUS | Status: DC | PRN
  Administered 2015-03-21 – 2015-03-25 (×5): 4 mg via INTRAVENOUS
  Filled 2015-03-21 (×5): qty 2

## 2015-03-21 MED ORDER — HEPARIN SODIUM (PORCINE) 5000 UNIT/ML IJ SOLN
5000.0000 [IU] | Freq: Three times a day (TID) | INTRAMUSCULAR | Status: DC
  Administered 2015-03-21 – 2015-03-27 (×18): 5000 [IU] via SUBCUTANEOUS
  Filled 2015-03-21 (×21): qty 1

## 2015-03-21 MED ORDER — MORPHINE SULFATE (PF) 4 MG/ML IV SOLN
4.0000 mg | Freq: Once | INTRAVENOUS | Status: AC
  Administered 2015-03-21: 4 mg via INTRAVENOUS
  Filled 2015-03-21: qty 1

## 2015-03-21 MED ORDER — ONDANSETRON HCL 4 MG/2ML IJ SOLN
4.0000 mg | Freq: Four times a day (QID) | INTRAMUSCULAR | Status: DC | PRN
Start: 2015-03-21 — End: 2015-03-27

## 2015-03-21 MED ORDER — DIPHENHYDRAMINE HCL 25 MG PO CAPS: 25.0000 mg | ORAL_CAPSULE | Freq: Four times a day (QID) | ORAL | Status: DC | PRN

## 2015-03-21 MED ORDER — IOHEXOL 300 MG/ML  SOLN
50.0000 mL | Freq: Once | INTRAMUSCULAR | Status: AC | PRN
  Administered 2015-03-21: 50 mL via ORAL

## 2015-03-21 MED ORDER — SODIUM CHLORIDE 0.9 % IV BOLUS (SEPSIS)
1000.0000 mL | Freq: Once | INTRAVENOUS | Status: AC
  Administered 2015-03-21: 1000 mL via INTRAVENOUS

## 2015-03-21 MED ORDER — IOHEXOL 300 MG/ML  SOLN
100.0000 mL | Freq: Once | INTRAMUSCULAR | Status: AC | PRN
  Administered 2015-03-21: 100 mL via INTRAVENOUS

## 2015-03-21 NOTE — ED Provider Notes (Signed)
CSN: [id number]     Arrival date & time 03/21/15  0548 History   First MD Initiated Contact with Patient 03/21/15 [phone]     Chief Complaint  Patient presents with  . Abdominal Pain     (Consider location/radiation/quality/duration/timing/severity/associated sxs/prior Treatment) HPI   Patient is a 80 year old male with history of multiple small bowel resections presenting today with symptoms of small bowel obstruction. Patient since yesterday has felt distended, nauseous, unable to pass flatus or eat. Patient had this 3 times in the last couple years. He says that usually it is able to go away after bowel rest hospitalization. He has not had any surgeries for this recently.  Patient does have a remote history of multiple bowel surgeries in the past.  Patient denies any fever, vomiting, diarrhea, dysuria.  Past Medical History  Diagnosis Date  . Kidney calculi   . Anxiety   . Mental disorder   . Arthritis     ankylosing spodilitis  . Allergy     enviromental  . Blood transfusion without reported diagnosis     during surgery  . Cataract   . Depression   . Hyperlipidemia    Past Surgical History  Procedure Laterality Date  . Abdominal surgery      had abcess  . Joint replacement      hip  . Shoulder arthroscopy with rotator cuff repair and subacromial decompression Left 03/02/2013    Procedure: LEFT SHOULDER ARTHROSCOPY WITH SUBACROMIAL DECOMPRESSION DISTAL CALVICLE RESECTION AND ROTATOR CUFF REPAIR ;  Surgeon: [name], MD;  Location: [place];  Service: Orthopedics;  Laterality: Left;  . Cholecystectomy    . Colonoscopy    . Colostomy    . Colostomy reversal    . Appendectomy     Family History  Problem Relation Age of Onset  . Colon cancer Neg Hx    Social History  Substance Use Topics  . Smoking status: Former Research scientist (life sciences)  . Smokeless tobacco: Never Used  . Alcohol Use: Yes     Comment: 3 beers or glasses of wine a day     Review of Systems  Constitutional:  Negative for fever and activity change.  HENT: Negative for drooling and hearing loss.   Eyes: Negative for discharge and redness.  Respiratory: Negative for cough and shortness of breath.   Cardiovascular: Negative for chest pain.  Gastrointestinal: Positive for nausea and abdominal pain. Negative for diarrhea.  Genitourinary: Negative for dysuria.  Musculoskeletal: Negative for arthralgias.  Allergic/Immunologic: Negative for immunocompromised state.  Neurological: Negative for seizures.  Psychiatric/Behavioral: Negative for agitation.  All other systems reviewed and are negative.     Allergies  Indomethacin  Home Medications   Prior to Admission medications   Medication Sig Start Date End Date Taking? Authorizing Provider  Biotin 1 MG CAPS Take 1 capsule by mouth daily.   Yes Historical Provider, MD  Calcium Carbonate-Vitamin D (CALCIUM 600+D) 600-400 MG-UNIT per tablet Take 1 tablet by mouth 2 (two) times daily.   Yes Historical Provider, MD  cholecalciferol (VITAMIN D) 400 UNITS TABS tablet Take 1,000 Units by mouth daily.    Yes Historical Provider, MD  fenofibrate (TRICOR) 145 MG tablet Take 145 mg by mouth every evening.    Yes Historical Provider, MD  folic acid-pyridoxine-cyancobalamin (FOLBIC) 2.5-25-2 MG TABS Take 1 tablet by mouth every evening.    Yes Historical Provider, MD  HYDROcodone-acetaminophen (NORCO/VICODIN) 5-325 MG per tablet Take 1 tablet by mouth every evening.    Yes  Historical Provider, MD  meloxicam (MOBIC) 7.5 MG tablet Take 7.5 mg by mouth 2 (two) times daily.   Yes Historical Provider, MD  Multiple Vitamins-Minerals (PRESERVISION AREDS 2 PO) Take 1 tablet by mouth daily.    Yes Historical Provider, MD  citalopram (CELEXA) 40 MG tablet Take 40 mg by mouth daily.    Historical Provider, MD   BP 142/69 mmHg  Pulse 62  Temp(Src) 97.6 F (36.4 C) (Oral)  Resp 16  Ht 5\' 6"  (1.676 m)  Wt 150 lb (68.04 kg)  BMI 24.22 kg/m2  SpO2 97% Physical Exam   Constitutional: He is oriented to person, place, and time. He appears well-nourished.  HENT:  Head: Normocephalic.  Mouth/Throat: Oropharynx is clear and moist.  Eyes: Conjunctivae are normal.  Neck: No tracheal deviation present.  Cardiovascular: Normal rate.   Pulmonary/Chest: Effort normal. No stridor. No respiratory distress.  Abdominal: He exhibits distension. There is tenderness. There is no guarding.  Musculoskeletal: Normal range of motion. He exhibits no edema.  Neurological: He is oriented to person, place, and time. No cranial nerve deficit.  Skin: Skin is warm and dry. No rash noted. He is not diaphoretic.  Psychiatric: He has a normal mood and affect. His behavior is normal.  Nursing note and vitals reviewed.   ED Course  Procedures (including critical care time) Labs Review Labs Reviewed  CBC WITH DIFFERENTIAL/PLATELET - Abnormal; Notable for the following:    WBC 11.1 (*)    Neutro Abs 9.5 (*)    Lymphs Abs 0.6 (*)    All other components within normal limits  COMPREHENSIVE METABOLIC PANEL - Abnormal; Notable for the following:    Chloride 98 (*)    Glucose, Bld 167 (*)    BUN 21 (*)    Total Protein 8.6 (*)    GFR calc non Af Amer 58 (*)    All other components within normal limits  I-STAT CHEM 8, ED - Abnormal; Notable for the following:    Chloride 100 (*)    BUN 22 (*)    Glucose, Bld 162 (*)    Calcium, Ion 1.11 (*)    All other components within normal limits  LIPASE, BLOOD  URINALYSIS, ROUTINE W REFLEX MICROSCOPIC (NOT AT [facility])    Imaging Review Ct Abdomen Pelvis W Contrast  03/21/2015  ADDENDUM REPORT: 03/21/2015 13:11 ADDENDUM: The acetabular component of the right hip replacement is rotated inferiorly with superior projection of the femoral head component with subluxation. Patient at risk for dislocation Electronically Signed   By: Genia Del M.D.   On: 03/21/2015 13:11  03/21/2015  CLINICAL DATA:  80 year old male with multiple prior small  bowel resections presenting with distended abdomen with nausea. Post cholecystectomy and appendectomy. Prior colostomy with reversal. Initial encounter. EXAM: CT ABDOMEN AND PELVIS WITH CONTRAST TECHNIQUE: Multidetector CT imaging of the abdomen and pelvis was performed using the standard protocol following bolus administration of intravenous contrast. CONTRAST:  11mL OMNIPAQUE IOHEXOL 300 MG/ML  SOLN COMPARISON:  04/10/2014 CT. FINDINGS: Dilated stomach and small bowel loops to level of the jejunum where there is a change in caliber. This may be related to presence of adhesions. No definitive mass identified although evaluation limited by streak artifact caused by a right hip implant. No focal bowel containing hernia. Prominence of small bowel loops left lower quadrant at prior resection site. Small amount of free fluid right lower quadrant. No free intraperitoneal air. Post colonic resection with reanastomosis left lower quadrant. Scattered colonic diverticula.  Bilateral nonobstructing renal calculi with regions of scarring bilaterally. No hydronephrosis. Scattered renal low-density structures may be cysts although majority too small to characterize. Post cholecystectomy. No worrisome hepatic, splenic, pancreatic or adrenal lesion. Atherosclerotic type changes of the abdominal aorta with ectasia including bulge at the L3-4 level measuring up to 2.6 cm whereas just above this level the aorta measures 2.1 cm. This is without significant change from prior exam. Atherosclerotic type changes aortic branch vessels. Limited evaluation urinary bladder secondary streak artifact from right hip implant. Prostate gland appears be enlarged. Clinical and laboratory correlation recommended. No adenopathy. Lung bases clear. IMPRESSION: Prior colonic and small bowel resection. Small bowel obstruction to level of the jejunum where there is a change in caliber. This may be related to presence of adhesions. No definitive mass  identified although evaluation limited by streak artifact caused by a right hip implant. No focal bowel containing hernia. Small amount of free fluid right lower quadrant. No free intraperitoneal air. Bilateral nonobstructing renal calculi with regions of scarring bilaterally. No hydronephrosis. Scattered renal low-density structures may be cysts although majority too small to characterize. Atherosclerotic type changes of the abdominal aorta with ectasia including bulge at the L3-4 level measuring up to 2.6 cm whereas just above this level the aorta measures 2.1 cm. This is without significant change from prior exam. Prostate gland appears be enlarged. Clinical and laboratory correlation recommended. Electronically Signed: By: Genia Del M.D. On: 03/21/2015 09:42   Dg Abd Portable 1v  03/21/2015  CLINICAL DATA:  80 year old male with nasogastric tube placement. Subsequent encounter. EXAM: PORTABLE ABDOMEN - 1 VIEW COMPARISON:  03/21/2015 CT. FINDINGS: Nasogastric tube has been placed. The side hole is at the level of gastroesophageal junction. Recommend advancing by 3.5 cm. Gas distended stomach. Fluid-filled dilated small bowel loops less appreciated on plain film exam. Residual contrast from recent CT. The acetabular component of the right hip replacement is rotated inferiorly with superior projection of the femoral head component with subluxation. Patient at risk for dislocation. Ankylosis thoracic and lumbar spine. IMPRESSION: Nasogastric tube has been placed. The side hole is at the level of gastroesophageal junction. Recommend advancing by 3.5 cm. Gas distended stomach. Fluid-filled dilated small bowel loops less appreciated on plain film exam. Residual contrast from recent CT. The acetabular component of the right hip replacement is rotated inferiorly with superior projection of the femoral head component with subluxation. Patient at risk for dislocation. Electronically Signed   By: Genia Del M.D.    On: 03/21/2015 13:10   I have personally reviewed and evaluated these images and lab results as part of my medical decision-making.   EKG Interpretation None      MDM   Final diagnoses:  SBO (small bowel obstruction) [facility])  Patient is a 80 year old male presenting with distended abdomen and tenderness. Patient's had multiple small bowel obstructions in the last couple months he feels this is similar. Patient endorses nausea, no passing of bowel or flatus,.  Will treat symptoms with antiemetics and pain medications.   Will order CT exam. Anticipate positive for small bowel and admission.  Pt had + CT for SBO. Gen surgery consulted. NG tube ordered. Pt admitted.  Iyesha Such Julio Alm, MD 03/21/15 [phone]

## 2015-03-21 NOTE — ED Notes (Signed)
Surgery at bedside.

## 2015-03-21 NOTE — Progress Notes (Signed)
Per order and xray results NGT advanced 3.5cm. Patient tolerated well. NGT tube patent and on low intermittent suction. Will continue to monitor patient.

## 2015-03-21 NOTE — H&P (Signed)
Steven [name] is an 80 y.o. male.   PCP:  Thressa Sheller, MD   Chief Complaint: nausea with abdominal distension HPI: Pt presents to the Ed with nausea and distension, he thinks he has recurrent SBO he has had this issue multiple times in the past, the last time was Dec 2015. He reports some cramping of the abdomen yesterday before lunch around 11AM.  He ate lunch and had progressively worsening cramps and pain in the abdomen as the night progressed.  His last BM was yesterday AM, he reports it was normal and he has never had an issue with constipation.   He has had multiple abdominal surgeries as noted below. Work up in the ED shows WBC is up, glucose is up, is dehydrated some at this point.  CT scan shows:Small bowel obstruction to level of the jejunum where there is a change in caliber. This may be related to presence of adhesions. No definitive mass identified although evaluation limited by streak artifact caused by a right hip implant. No focal bowel containing hernia.  Bilateral nonobstructing renal calculi with regions of scarring bilaterally. No hydronephrosis  Past Medical History  Diagnosis Date  . Kidney calculi   . Anxiety   . Arthritis     ankylosing spodilitis  . Allergy     enviromental  . Blood transfusion without reported diagnosis     during surgery  . Cataract   . Depression   . Hyperlipidemia     Past Surgical History  Procedure Laterality Date  . Abdominal surgery      had abcess  This was in the early 90's with the reversal several months later.[id number] range.  . Joint replacement      hip  . Shoulder arthroscopy with rotator cuff repair and subacromial decompression Left 03/02/2013    Procedure: LEFT SHOULDER ARTHROSCOPY WITH SUBACROMIAL DECOMPRESSION DISTAL CALVICLE RESECTION AND ROTATOR CUFF REPAIR ;  Surgeon: [name], MD;  Location: [place];  Service: Orthopedics;  Laterality: Left;  . Cholecystectomy, with extensive lysis of adhesions 2011   .  Colonoscopy    . Colostomy    . Colostomy reversal    . Appendectomy      Family History  Problem Relation Age of Onset  . Colon cancer Neg Hx    Social History:  reports that he has quit smoking. He has never used smokeless tobacco. He reports that he drinks alcohol. He reports that he does not use illicit drugs.  Allergies:  Allergies  Allergen Reactions  . Indomethacin Hives    No current facility-administered medications on file prior to encounter.   Current Outpatient Prescriptions on File Prior to Encounter  Medication Sig Dispense Refill  . Calcium Carbonate-Vitamin D (CALCIUM 600+D) 600-400 MG-UNIT per tablet Take 1 tablet by mouth 2 (two) times daily.    . cholecalciferol (VITAMIN D) 400 UNITS TABS tablet Take 1,000 Units by mouth daily.     . fenofibrate (TRICOR) 145 MG tablet Take 145 mg by mouth every evening.     . folic acid-pyridoxine-cyancobalamin (FOLBIC) 2.5-25-2 MG TABS Take 1 tablet by mouth every evening.     Marland Kitchen HYDROcodone-acetaminophen (NORCO/VICODIN) 5-325 MG per tablet Take 1 tablet by mouth every evening.     . meloxicam (MOBIC) 7.5 MG tablet Take 7.5 mg by mouth 2 (two) times daily.    . Multiple Vitamins-Minerals (PRESERVISION AREDS 2 PO) Take 1 tablet by mouth daily.     . citalopram (CELEXA) 40 MG tablet Take 40  mg by mouth daily.      Results for orders placed or performed during the hospital encounter of 03/21/15 (from the past 48 hour(s))  CBC with Differential/Platelet     Status: Abnormal   Collection Time: 03/21/15  6:24 AM  Result Value Ref Range   WBC 11.1 (H) 4.0 - 10.5 K/uL   RBC 5.09 4.22 - 5.81 MIL/uL   Hemoglobin 14.1 13.0 - 17.0 g/dL   HCT 43.4 39.0 - 52.0 %   MCV 85.3 78.0 - 100.0 fL   MCH 27.7 26.0 - 34.0 pg   MCHC 32.5 30.0 - 36.0 g/dL   RDW 14.3 11.5 - 15.5 %   Platelets 342 150 - 400 K/uL   Neutrophils Relative % 85 %   Neutro Abs 9.5 (H) 1.7 - 7.7 K/uL   Lymphocytes Relative 6 %   Lymphs Abs 0.6 (L) 0.7 - 4.0 K/uL    Monocytes Relative 8 %   Monocytes Absolute 0.9 0.1 - 1.0 K/uL   Eosinophils Relative 1 %   Eosinophils Absolute 0.1 0.0 - 0.7 K/uL   Basophils Relative 0 %   Basophils Absolute 0.0 0.0 - 0.1 K/uL  Comprehensive metabolic panel     Status: Abnormal   Collection Time: 03/21/15  6:24 AM  Result Value Ref Range   Sodium 137 135 - 145 mmol/L   Potassium 4.5 3.5 - 5.1 mmol/L   Chloride 98 (L) 101 - 111 mmol/L   CO2 27 22 - 32 mmol/L   Glucose, Bld 167 (H) 65 - 99 mg/dL   BUN 21 (H) 6 - 20 mg/dL   Creatinine, Ser 1.16 0.61 - 1.24 mg/dL   Calcium 10.2 8.9 - 10.3 mg/dL   Total Protein 8.6 (H) 6.5 - 8.1 g/dL   Albumin 4.7 3.5 - 5.0 g/dL   AST 25 15 - 41 U/L   ALT 18 17 - 63 U/L   Alkaline Phosphatase 51 38 - 126 U/L   Total Bilirubin 1.1 0.3 - 1.2 mg/dL   GFR calc non Af Amer 58 (L) >60 mL/min   GFR calc Af Amer >60 >60 mL/min    Comment: (NOTE) The eGFR has been calculated using the CKD EPI equation. This calculation has not been validated in all clinical situations. eGFR's persistently <60 mL/min signify possible Chronic Kidney Disease.    Anion gap 12 5 - 15  Lipase, blood     Status: None   Collection Time: 03/21/15  6:24 AM  Result Value Ref Range   Lipase 42 11 - 51 U/L  I-stat chem 8, ed     Status: Abnormal   Collection Time: 03/21/15  6:44 AM  Result Value Ref Range   Sodium 138 135 - 145 mmol/L   Potassium 4.4 3.5 - 5.1 mmol/L   Chloride 100 (L) 101 - 111 mmol/L   BUN 22 (H) 6 - 20 mg/dL   Creatinine, Ser 1.10 0.61 - 1.24 mg/dL   Glucose, Bld 162 (H) 65 - 99 mg/dL   Calcium, Ion 1.11 (L) 1.13 - 1.30 mmol/L   TCO2 28 0 - 100 mmol/L   Hemoglobin 16.7 13.0 - 17.0 g/dL   HCT 49.0 39.0 - 52.0 %  Urinalysis, Routine w reflex microscopic (not at [facility])     Status: None   Collection Time: 03/21/15  7:35 AM  Result Value Ref Range   Color, Urine YELLOW YELLOW   APPearance CLEAR CLEAR   Specific Gravity, Urine 1.020 1.005 - 1.030   pH  6.0 5.0 - 8.0   Glucose, UA NEGATIVE  NEGATIVE mg/dL   Hgb urine dipstick NEGATIVE NEGATIVE   Bilirubin Urine NEGATIVE NEGATIVE   Ketones, ur NEGATIVE NEGATIVE mg/dL   Protein, ur NEGATIVE NEGATIVE mg/dL   Nitrite NEGATIVE NEGATIVE   Leukocytes, UA NEGATIVE NEGATIVE    Comment: MICROSCOPIC NOT DONE ON URINES WITH NEGATIVE PROTEIN, BLOOD, LEUKOCYTES, NITRITE, OR GLUCOSE <1000 mg/dL.   Ct Abdomen Pelvis W Contrast  03/21/2015  CLINICAL DATA:  80 year old male with multiple prior small bowel resections presenting with distended abdomen with nausea. Post cholecystectomy and appendectomy. Prior colostomy with reversal. Initial encounter. EXAM: CT ABDOMEN AND PELVIS WITH CONTRAST TECHNIQUE: Multidetector CT imaging of the abdomen and pelvis was performed using the standard protocol following bolus administration of intravenous contrast. CONTRAST:  176m OMNIPAQUE IOHEXOL 300 MG/ML  SOLN COMPARISON:  04/10/2014 CT. FINDINGS: Dilated stomach and small bowel loops to level of the jejunum where there is a change in caliber. This may be related to presence of adhesions. No definitive mass identified although evaluation limited by streak artifact caused by a right hip implant. No focal bowel containing hernia. Prominence of small bowel loops left lower quadrant at prior resection site. Small amount of free fluid right lower quadrant. No free intraperitoneal air. Post colonic resection with reanastomosis left lower quadrant. Scattered colonic diverticula. Bilateral nonobstructing renal calculi with regions of scarring bilaterally. No hydronephrosis. Scattered renal low-density structures may be cysts although majority too small to characterize. Post cholecystectomy. No worrisome hepatic, splenic, pancreatic or adrenal lesion. Atherosclerotic type changes of the abdominal aorta with ectasia including bulge at the L3-4 level measuring up to 2.6 cm whereas just above this level the aorta measures 2.1 cm. This is without significant change from prior exam.  Atherosclerotic type changes aortic branch vessels. Limited evaluation urinary bladder secondary streak artifact from right hip implant. Prostate gland appears be enlarged. Clinical and laboratory correlation recommended. No adenopathy. Lung bases clear. IMPRESSION: Prior colonic and small bowel resection. Small bowel obstruction to level of the jejunum where there is a change in caliber. This may be related to presence of adhesions. No definitive mass identified although evaluation limited by streak artifact caused by a right hip implant. No focal bowel containing hernia. Small amount of free fluid right lower quadrant. No free intraperitoneal air. Bilateral nonobstructing renal calculi with regions of scarring bilaterally. No hydronephrosis. Scattered renal low-density structures may be cysts although majority too small to characterize. Atherosclerotic type changes of the abdominal aorta with ectasia including bulge at the L3-4 level measuring up to 2.6 cm whereas just above this level the aorta measures 2.1 cm. This is without significant change from prior exam. Prostate gland appears be enlarged. Clinical and laboratory correlation recommended. Electronically Signed   By: SGenia DelM.D.   On: 03/21/2015 09:42    Review of Systems  Constitutional: Negative.   HENT: Positive for hearing loss.   Eyes: Negative.   Respiratory: Positive for cough (he says this is from his chronic nasal drainage).   Cardiovascular: Negative.   Gastrointestinal: Positive for nausea and abdominal pain. Negative for vomiting, diarrhea, constipation and blood in stool.       Last BM was yesterday AM.  Genitourinary: Negative.   Musculoskeletal: Positive for back pain.  Skin: Negative.   Neurological: Negative.   Endo/Heme/Allergies: Negative.   Psychiatric/Behavioral: Positive for depression.    Blood pressure 133/84, pulse 64, temperature 97.5 F (36.4 C), temperature source Oral, resp. rate 16, height 5'  6"  (1.676 m), weight 68.04 kg (150 lb), SpO2 95 %. Physical Exam  Constitutional: He is oriented to person, place, and time. He appears well-developed and well-nourished. No distress.  Thin male in no distress  HENT:  Head: Normocephalic.  Nose: Nose normal.  Eyes: Conjunctivae and EOM are normal. Right eye exhibits no discharge. Left eye exhibits no discharge. No scleral icterus.  Neck: Neck supple. No JVD present. Tracheal deviation present. No thyromegaly present.  Cardiovascular: Normal rate, regular rhythm, normal heart sounds and intact distal pulses.   No murmur heard. Respiratory: Effort normal and breath sounds normal. No respiratory distress. He has no wheezes. He has no rales. He exhibits no tenderness.  GI: He exhibits distension. He exhibits no mass. There is no rebound and no guarding.  He is fairly distended, tight, not overly tender.  BS are high pitched, and hypoactive but present. He has midline and RUQ scars from prior surgeries.  Musculoskeletal: He exhibits no edema.  Lymphadenopathy:    He has no cervical adenopathy.  Neurological: He is alert and oriented to person, place, and time. No cranial nerve deficit.  Skin: Skin is warm and dry. No rash noted. He is not diaphoretic. No erythema. No pallor.  Psychiatric: He has a normal mood and affect. His behavior is normal. Judgment and thought content normal.     Assessment/Plan Recurrent SBO S/p multiple abdominal surgeries Hx of Ankylosing Spondylitis Hx of depression/anxiety Remote hx of tobacco use Chronic sinus drainage secondary to allergies/on Allergy shots per [facility] Pulmonary service. Antibiotics:  None DVT:  Heparin/SCD's   Plan:  Admit, NG decompression, ice chips only, rehydrate, hold NSAID till creatinine is better.  Recheck labs and film in the AM.  Tarrance Januszewski 03/21/2015, 10:21 AM

## 2015-03-21 NOTE — ED Notes (Signed)
Patient transported to CT 

## 2015-03-21 NOTE — ED Notes (Signed)
Pt states he thinks he has a blocked intestine  Pt states he started having pain yesterday around lunch  Pt states he has had this problem before  Pt has not eaten since lunch yesterday  Pt is c/o nausea  Pt states his abdomen feels distended  Pt states he has not been passing flatus  Last BM was yesterday morning

## 2015-03-22 ENCOUNTER — Observation Stay (HOSPITAL_COMMUNITY): Payer: Medicare Other

## 2015-03-22 DIAGNOSIS — Z87442 Personal history of urinary calculi: Secondary | ICD-10-CM | POA: Diagnosis not present

## 2015-03-22 DIAGNOSIS — K598 Other specified functional intestinal disorders: Secondary | ICD-10-CM | POA: Diagnosis not present

## 2015-03-22 DIAGNOSIS — K6389 Other specified diseases of intestine: Secondary | ICD-10-CM | POA: Diagnosis not present

## 2015-03-22 DIAGNOSIS — K5660 Unspecified intestinal obstruction: Secondary | ICD-10-CM | POA: Diagnosis not present

## 2015-03-22 DIAGNOSIS — K565 Intestinal adhesions [bands] with obstruction (postprocedural) (postinfection): Secondary | ICD-10-CM | POA: Diagnosis present

## 2015-03-22 DIAGNOSIS — Z87891 Personal history of nicotine dependence: Secondary | ICD-10-CM | POA: Diagnosis not present

## 2015-03-22 DIAGNOSIS — Z96641 Presence of right artificial hip joint: Secondary | ICD-10-CM | POA: Diagnosis present

## 2015-03-22 DIAGNOSIS — E860 Dehydration: Secondary | ICD-10-CM | POA: Diagnosis present

## 2015-03-22 DIAGNOSIS — R110 Nausea: Secondary | ICD-10-CM | POA: Diagnosis present

## 2015-03-22 DIAGNOSIS — Z452 Encounter for adjustment and management of vascular access device: Secondary | ICD-10-CM | POA: Diagnosis not present

## 2015-03-22 DIAGNOSIS — R140 Abdominal distension (gaseous): Secondary | ICD-10-CM | POA: Diagnosis not present

## 2015-03-22 DIAGNOSIS — Z9049 Acquired absence of other specified parts of digestive tract: Secondary | ICD-10-CM | POA: Diagnosis not present

## 2015-03-22 LAB — BASIC METABOLIC PANEL
ANION GAP: 10 (ref 5–15)
BUN: 15 mg/dL (ref 6–20)
CALCIUM: 8.8 mg/dL — AB (ref 8.9–10.3)
CO2: 25 mmol/L (ref 22–32)
Chloride: 103 mmol/L (ref 101–111)
Creatinine, Ser: 0.87 mg/dL (ref 0.61–1.24)
GLUCOSE: 100 mg/dL — AB (ref 65–99)
POTASSIUM: 3.8 mmol/L (ref 3.5–5.1)
Sodium: 138 mmol/L (ref 135–145)

## 2015-03-22 LAB — CBC
HEMATOCRIT: 38.1 % — AB (ref 39.0–52.0)
HEMOGLOBIN: 12.2 g/dL — AB (ref 13.0–17.0)
MCH: 27.1 pg (ref 26.0–34.0)
MCHC: 32 g/dL (ref 30.0–36.0)
MCV: 84.5 fL (ref 78.0–100.0)
Platelets: 264 10*3/uL (ref 150–400)
RBC: 4.51 MIL/uL (ref 4.22–5.81)
RDW: 14.2 % (ref 11.5–15.5)
WBC: 6.2 10*3/uL (ref 4.0–10.5)

## 2015-03-22 NOTE — Progress Notes (Signed)
Subjective: Still feels distended.  No flatus or BM.  Objective: Vital signs in last 24 hours: Temp:  [97.6 F (36.4 C)-98.6 F (37 C)] 98.6 F (37 C) (12/09 0535) Pulse Rate:  [62-67] 67 (12/09 0535) Resp:  [13-16] 13 (12/09 0535) BP: (119-142)/(64-76) 139/64 mmHg (12/09 0535) SpO2:  [95 %-99 %] 99 % (12/09 0535) Last BM Date: 03/20/15  Intake/Output from previous day: 12/08 0701 - 12/09 0700 In: 201.7 [I.V.:201.7] Out: 1925 [Urine:425; Emesis/NG output:1500] Intake/Output this shift:    PE: General- In NAD Abdomen-slight firm and distended, occasional rushing bowel sound  Lab Results:   Recent Labs  03/21/15 0624 03/21/15 0644 03/22/15 0428  WBC 11.1*  --  6.2  HGB 14.1 16.7 12.2*  HCT 43.4 49.0 38.1*  PLT 342  --  264   BMET  Recent Labs  03/21/15 0624 03/21/15 0644 03/22/15 0428  NA 137 138 138  K 4.5 4.4 3.8  CL 98* 100* 103  CO2 27  --  25  GLUCOSE 167* 162* 100*  BUN 21* 22* 15  CREATININE 1.16 1.10 0.87  CALCIUM 10.2  --  8.8*   PT/INR No results for input(s): LABPROT, INR in the last 72 hours. Comprehensive Metabolic Panel:    Component Value Date/Time   NA 138 03/22/2015 0428   NA 138 03/21/2015 0644   NA 141 04/20/2012 0425   NA 137 04/18/2012 2324   K 3.8 03/22/2015 0428   K 4.4 03/21/2015 0644   K 4.1 04/20/2012 0425   K 4.1 04/18/2012 2324   CL 103 03/22/2015 0428   CL 100* 03/21/2015 0644   CL 109* 04/20/2012 0425   CL 104 04/18/2012 2324   CO2 25 03/22/2015 0428   CO2 27 03/21/2015 0624   CO2 28 04/20/2012 0425   CO2 22 04/18/2012 2324   BUN 15 03/22/2015 0428   BUN 22* 03/21/2015 0644   BUN 14 04/20/2012 0425   BUN 14 04/18/2012 2324   CREATININE 0.87 03/22/2015 0428   CREATININE 1.10 03/21/2015 0644   CREATININE 0.95 04/20/2012 0425   CREATININE 0.85 04/18/2012 2324   GLUCOSE 100* 03/22/2015 0428   GLUCOSE 162* 03/21/2015 0644   GLUCOSE 144* 04/20/2012 0425   GLUCOSE 150* 04/18/2012 2324   CALCIUM 8.8*  03/22/2015 0428   CALCIUM 10.2 03/21/2015 0624   CALCIUM 8.2* 04/20/2012 0425   CALCIUM 9.1 04/18/2012 2324   AST 25 03/21/2015 0624   AST 28 04/10/2014 0815   AST 38* 04/20/2012 0425   AST 32 04/18/2012 2324   ALT 18 03/21/2015 0624   ALT 17 04/10/2014 0815   ALT 41 04/20/2012 0425   ALT 45 04/18/2012 2324   ALKPHOS 51 03/21/2015 0624   ALKPHOS 40 04/10/2014 0815   ALKPHOS 40* 04/20/2012 0425   ALKPHOS 51 04/18/2012 2324   BILITOT 1.1 03/21/2015 0624   BILITOT 0.6 04/10/2014 0815   BILITOT 0.6 04/20/2012 0425   BILITOT 0.8 04/18/2012 2324   PROT 8.6* 03/21/2015 0624   PROT 6.9 04/10/2014 0815   PROT 7.0 04/20/2012 0425   PROT 8.0 04/18/2012 2324   ALBUMIN 4.7 03/21/2015 0624   ALBUMIN 4.0 04/10/2014 0815   ALBUMIN 3.5 04/20/2012 0425   ALBUMIN 4.2 04/18/2012 2324     Studies/Results: Ct Abdomen Pelvis W Contrast  03/21/2015  ADDENDUM REPORT: 03/21/2015 13:11 ADDENDUM: The acetabular component of the right hip replacement is rotated inferiorly with superior projection of the femoral head component with subluxation. Patient at risk for dislocation Electronically  Signed   By: Genia Del M.D.   On: 03/21/2015 13:11  03/21/2015  CLINICAL DATA:  80 year old male with multiple prior small bowel resections presenting with distended abdomen with nausea. Post cholecystectomy and appendectomy. Prior colostomy with reversal. Initial encounter. EXAM: CT ABDOMEN AND PELVIS WITH CONTRAST TECHNIQUE: Multidetector CT imaging of the abdomen and pelvis was performed using the standard protocol following bolus administration of intravenous contrast. CONTRAST:  142mL OMNIPAQUE IOHEXOL 300 MG/ML  SOLN COMPARISON:  04/10/2014 CT. FINDINGS: Dilated stomach and small bowel loops to level of the jejunum where there is a change in caliber. This may be related to presence of adhesions. No definitive mass identified although evaluation limited by streak artifact caused by a right hip implant. No focal bowel  containing hernia. Prominence of small bowel loops left lower quadrant at prior resection site. Small amount of free fluid right lower quadrant. No free intraperitoneal air. Post colonic resection with reanastomosis left lower quadrant. Scattered colonic diverticula. Bilateral nonobstructing renal calculi with regions of scarring bilaterally. No hydronephrosis. Scattered renal low-density structures may be cysts although majority too small to characterize. Post cholecystectomy. No worrisome hepatic, splenic, pancreatic or adrenal lesion. Atherosclerotic type changes of the abdominal aorta with ectasia including bulge at the L3-4 level measuring up to 2.6 cm whereas just above this level the aorta measures 2.1 cm. This is without significant change from prior exam. Atherosclerotic type changes aortic branch vessels. Limited evaluation urinary bladder secondary streak artifact from right hip implant. Prostate gland appears be enlarged. Clinical and laboratory correlation recommended. No adenopathy. Lung bases clear. IMPRESSION: Prior colonic and small bowel resection. Small bowel obstruction to level of the jejunum where there is a change in caliber. This may be related to presence of adhesions. No definitive mass identified although evaluation limited by streak artifact caused by a right hip implant. No focal bowel containing hernia. Small amount of free fluid right lower quadrant. No free intraperitoneal air. Bilateral nonobstructing renal calculi with regions of scarring bilaterally. No hydronephrosis. Scattered renal low-density structures may be cysts although majority too small to characterize. Atherosclerotic type changes of the abdominal aorta with ectasia including bulge at the L3-4 level measuring up to 2.6 cm whereas just above this level the aorta measures 2.1 cm. This is without significant change from prior exam. Prostate gland appears be enlarged. Clinical and laboratory correlation recommended.  Electronically Signed: By: Genia Del M.D. On: 03/21/2015 09:42   Dg Abd 2 Views  03/22/2015  CLINICAL DATA:  80 year old male with history of abdominal distention. History of small-bowel obstruction. EXAM: ABDOMEN - 2 VIEW COMPARISON:  Abdominal radiograph 03/21/2015. FINDINGS: NG tube in position with tip terminating in the antral pre-pyloric region of the stomach or proximal duodenum. Dilated loop of small bowel on the left side of the abdomen with air-fluid level again noted. Minimal gas and stool noted in the colon. No pneumoperitoneum. Status post right total hip arthroplasty, with very vertically oriented acetabular component, and slight cephalad subluxation of the femoral head, similar to prior examinations. IMPRESSION: 1. Findings remain compatible with partial small bowel obstruction, as above. Electronically Signed   By: Vinnie Langton M.D.   On: 03/22/2015 08:35   Dg Abd Portable 1v  03/21/2015  CLINICAL DATA:  80 year old male with nasogastric tube placement. Subsequent encounter. EXAM: PORTABLE ABDOMEN - 1 VIEW COMPARISON:  03/21/2015 CT. FINDINGS: Nasogastric tube has been placed. The side hole is at the level of gastroesophageal junction. Recommend advancing by 3.5 cm. Gas distended  stomach. Fluid-filled dilated small bowel loops less appreciated on plain film exam. Residual contrast from recent CT. The acetabular component of the right hip replacement is rotated inferiorly with superior projection of the femoral head component with subluxation. Patient at risk for dislocation. Ankylosis thoracic and lumbar spine. IMPRESSION: Nasogastric tube has been placed. The side hole is at the level of gastroesophageal junction. Recommend advancing by 3.5 cm. Gas distended stomach. Fluid-filled dilated small bowel loops less appreciated on plain film exam. Residual contrast from recent CT. The acetabular component of the right hip replacement is rotated inferiorly with superior projection of the  femoral head component with subluxation. Patient at risk for dislocation. Electronically Signed   By: Genia Del M.D.   On: 03/21/2015 13:10    Anti-infectives: Anti-infectives    None      Assessment Active Problems:   SBO (small bowel obstruction) (HCC) due to adhesions:  He has known dense, diffuse adhesions    Plan: Continue attempt at nonoperative management (which has worked in the past) with ng tube decompression and bowel rest.  Repeat x-rays tomorrow.   Steven [name] 03/22/2015

## 2015-03-23 ENCOUNTER — Inpatient Hospital Stay (HOSPITAL_COMMUNITY): Payer: Medicare Other

## 2015-03-23 NOTE — Progress Notes (Signed)
Subjective: Still feels distended.  No flatus or BM.  NG 1300 ml  Objective: Vital signs in last 24 hours: Temp:  [97.7 F (36.5 C)-98.4 F (36.9 C)] 97.7 F (36.5 C) (12/10 0524) Pulse Rate:  [59-68] 59 (12/10 0524) Resp:  [14-16] 14 (12/10 0524) BP: (121-138)/(62-74) 121/62 mmHg (12/10 0524) SpO2:  [97 %-100 %] 98 % (12/10 0524) Last BM Date: 03/20/15  Intake/Output from previous day: 12/09 0701 - 12/10 0700 In: 2900 [I.V.:2900] Out: 2250 [Urine:950; Emesis/NG output:1300] Intake/Output this shift:    PE: General- In NAD Abdomen- distended, soft NG: bilious  Lab Results:   Recent Labs  03/21/15 0624 03/21/15 0644 03/22/15 0428  WBC 11.1*  --  6.2  HGB 14.1 16.7 12.2*  HCT 43.4 49.0 38.1*  PLT 342  --  264   BMET  Recent Labs  03/21/15 0624 03/21/15 0644 03/22/15 0428  NA 137 138 138  K 4.5 4.4 3.8  CL 98* 100* 103  CO2 27  --  25  GLUCOSE 167* 162* 100*  BUN 21* 22* 15  CREATININE 1.16 1.10 0.87  CALCIUM 10.2  --  8.8*   PT/INR No results for input(s): LABPROT, INR in the last 72 hours. Comprehensive Metabolic Panel:    Component Value Date/Time   NA 138 03/22/2015 0428   NA 138 03/21/2015 0644   NA 141 04/20/2012 0425   NA 137 04/18/2012 2324   K 3.8 03/22/2015 0428   K 4.4 03/21/2015 0644   K 4.1 04/20/2012 0425   K 4.1 04/18/2012 2324   CL 103 03/22/2015 0428   CL 100* 03/21/2015 0644   CL 109* 04/20/2012 0425   CL 104 04/18/2012 2324   CO2 25 03/22/2015 0428   CO2 27 03/21/2015 0624   CO2 28 04/20/2012 0425   CO2 22 04/18/2012 2324   BUN 15 03/22/2015 0428   BUN 22* 03/21/2015 0644   BUN 14 04/20/2012 0425   BUN 14 04/18/2012 2324   CREATININE 0.87 03/22/2015 0428   CREATININE 1.10 03/21/2015 0644   CREATININE 0.95 04/20/2012 0425   CREATININE 0.85 04/18/2012 2324   GLUCOSE 100* 03/22/2015 0428   GLUCOSE 162* 03/21/2015 0644   GLUCOSE 144* 04/20/2012 0425   GLUCOSE 150* 04/18/2012 2324   CALCIUM 8.8* 03/22/2015 0428   CALCIUM 10.2 03/21/2015 0624   CALCIUM 8.2* 04/20/2012 0425   CALCIUM 9.1 04/18/2012 2324   AST 25 03/21/2015 0624   AST 28 04/10/2014 0815   AST 38* 04/20/2012 0425   AST 32 04/18/2012 2324   ALT 18 03/21/2015 0624   ALT 17 04/10/2014 0815   ALT 41 04/20/2012 0425   ALT 45 04/18/2012 2324   ALKPHOS 51 03/21/2015 0624   ALKPHOS 40 04/10/2014 0815   ALKPHOS 40* 04/20/2012 0425   ALKPHOS 51 04/18/2012 2324   BILITOT 1.1 03/21/2015 0624   BILITOT 0.6 04/10/2014 0815   BILITOT 0.6 04/20/2012 0425   BILITOT 0.8 04/18/2012 2324   PROT 8.6* 03/21/2015 0624   PROT 6.9 04/10/2014 0815   PROT 7.0 04/20/2012 0425   PROT 8.0 04/18/2012 2324   ALBUMIN 4.7 03/21/2015 0624   ALBUMIN 4.0 04/10/2014 0815   ALBUMIN 3.5 04/20/2012 0425   ALBUMIN 4.2 04/18/2012 2324     Studies/Results: Ct Abdomen Pelvis W Contrast  03/21/2015  ADDENDUM REPORT: 03/21/2015 13:11 ADDENDUM: The acetabular component of the right hip replacement is rotated inferiorly with superior projection of the femoral head component with subluxation. Patient at risk for dislocation Electronically  Signed   By: Genia Del M.D.   On: 03/21/2015 13:11  03/21/2015  CLINICAL DATA:  80 year old male with multiple prior small bowel resections presenting with distended abdomen with nausea. Post cholecystectomy and appendectomy. Prior colostomy with reversal. Initial encounter. EXAM: CT ABDOMEN AND PELVIS WITH CONTRAST TECHNIQUE: Multidetector CT imaging of the abdomen and pelvis was performed using the standard protocol following bolus administration of intravenous contrast. CONTRAST:  164mL OMNIPAQUE IOHEXOL 300 MG/ML  SOLN COMPARISON:  04/10/2014 CT. FINDINGS: Dilated stomach and small bowel loops to level of the jejunum where there is a change in caliber. This may be related to presence of adhesions. No definitive mass identified although evaluation limited by streak artifact caused by a right hip implant. No focal bowel containing hernia.  Prominence of small bowel loops left lower quadrant at prior resection site. Small amount of free fluid right lower quadrant. No free intraperitoneal air. Post colonic resection with reanastomosis left lower quadrant. Scattered colonic diverticula. Bilateral nonobstructing renal calculi with regions of scarring bilaterally. No hydronephrosis. Scattered renal low-density structures may be cysts although majority too small to characterize. Post cholecystectomy. No worrisome hepatic, splenic, pancreatic or adrenal lesion. Atherosclerotic type changes of the abdominal aorta with ectasia including bulge at the L3-4 level measuring up to 2.6 cm whereas just above this level the aorta measures 2.1 cm. This is without significant change from prior exam. Atherosclerotic type changes aortic branch vessels. Limited evaluation urinary bladder secondary streak artifact from right hip implant. Prostate gland appears be enlarged. Clinical and laboratory correlation recommended. No adenopathy. Lung bases clear. IMPRESSION: Prior colonic and small bowel resection. Small bowel obstruction to level of the jejunum where there is a change in caliber. This may be related to presence of adhesions. No definitive mass identified although evaluation limited by streak artifact caused by a right hip implant. No focal bowel containing hernia. Small amount of free fluid right lower quadrant. No free intraperitoneal air. Bilateral nonobstructing renal calculi with regions of scarring bilaterally. No hydronephrosis. Scattered renal low-density structures may be cysts although majority too small to characterize. Atherosclerotic type changes of the abdominal aorta with ectasia including bulge at the L3-4 level measuring up to 2.6 cm whereas just above this level the aorta measures 2.1 cm. This is without significant change from prior exam. Prostate gland appears be enlarged. Clinical and laboratory correlation recommended. Electronically Signed: By:  Genia Del M.D. On: 03/21/2015 09:42   Dg Abd 2 Views  03/22/2015  CLINICAL DATA:  80 year old male with history of abdominal distention. History of small-bowel obstruction. EXAM: ABDOMEN - 2 VIEW COMPARISON:  Abdominal radiograph 03/21/2015. FINDINGS: NG tube in position with tip terminating in the antral pre-pyloric region of the stomach or proximal duodenum. Dilated loop of small bowel on the left side of the abdomen with air-fluid level again noted. Minimal gas and stool noted in the colon. No pneumoperitoneum. Status post right total hip arthroplasty, with very vertically oriented acetabular component, and slight cephalad subluxation of the femoral head, similar to prior examinations. IMPRESSION: 1. Findings remain compatible with partial small bowel obstruction, as above. Electronically Signed   By: Vinnie Langton M.D.   On: 03/22/2015 08:35   Dg Abd Portable 1v  03/21/2015  CLINICAL DATA:  80 year old male with nasogastric tube placement. Subsequent encounter. EXAM: PORTABLE ABDOMEN - 1 VIEW COMPARISON:  03/21/2015 CT. FINDINGS: Nasogastric tube has been placed. The side hole is at the level of gastroesophageal junction. Recommend advancing by 3.5 cm. Gas distended  stomach. Fluid-filled dilated small bowel loops less appreciated on plain film exam. Residual contrast from recent CT. The acetabular component of the right hip replacement is rotated inferiorly with superior projection of the femoral head component with subluxation. Patient at risk for dislocation. Ankylosis thoracic and lumbar spine. IMPRESSION: Nasogastric tube has been placed. The side hole is at the level of gastroesophageal junction. Recommend advancing by 3.5 cm. Gas distended stomach. Fluid-filled dilated small bowel loops less appreciated on plain film exam. Residual contrast from recent CT. The acetabular component of the right hip replacement is rotated inferiorly with superior projection of the femoral head component with  subluxation. Patient at risk for dislocation. Electronically Signed   By: Genia Del M.D.   On: 03/21/2015 13:10    Anti-infectives: Anti-infectives    None      Assessment Active Problems:   SBO (small bowel obstruction) (HCC) due to adhesions:  He has known dense, diffuse adhesions  LOS: 1 day   Plan: Continue attempt at nonoperative management (which has worked in the past) with ng tube decompression and bowel rest.  Repeat x-rays scheduled for today.   Melida Northington C. [id number]

## 2015-03-24 ENCOUNTER — Inpatient Hospital Stay (HOSPITAL_COMMUNITY): Payer: Medicare Other

## 2015-03-24 LAB — BASIC METABOLIC PANEL
Anion gap: 13 (ref 5–15)
BUN: 13 mg/dL (ref 6–20)
CHLORIDE: 106 mmol/L (ref 101–111)
CO2: 24 mmol/L (ref 22–32)
Calcium: 8.6 mg/dL — ABNORMAL LOW (ref 8.9–10.3)
Creatinine, Ser: 0.69 mg/dL (ref 0.61–1.24)
Glucose, Bld: 71 mg/dL (ref 65–99)
POTASSIUM: 3.8 mmol/L (ref 3.5–5.1)
SODIUM: 143 mmol/L (ref 135–145)

## 2015-03-24 LAB — CBC
HCT: 33.4 % — ABNORMAL LOW (ref 39.0–52.0)
HEMOGLOBIN: 10.5 g/dL — AB (ref 13.0–17.0)
MCH: 27 pg (ref 26.0–34.0)
MCHC: 31.4 g/dL (ref 30.0–36.0)
MCV: 85.9 fL (ref 78.0–100.0)
PLATELETS: 243 10*3/uL (ref 150–400)
RBC: 3.89 MIL/uL — AB (ref 4.22–5.81)
RDW: 14.2 % (ref 11.5–15.5)
WBC: 4.6 10*3/uL (ref 4.0–10.5)

## 2015-03-24 MED ORDER — DIATRIZOATE MEGLUMINE & SODIUM 66-10 % PO SOLN
90.0000 mL | Freq: Once | ORAL | Status: AC
  Administered 2015-03-24: 90 mL via NASOGASTRIC
  Filled 2015-03-24: qty 90

## 2015-03-25 ENCOUNTER — Inpatient Hospital Stay (HOSPITAL_COMMUNITY): Payer: Medicare Other

## 2015-03-25 NOTE — Care Management Important Message (Signed)
Important Message  Patient Details  Name: Steven [name] MRN: [id number] Date of Birth: 08/03/1934   Medicare Important Message Given:  Yes    Camillo Flaming 03/25/2015, 2:19 [place] Message  Patient Details  Name: Steven [name] MRN: [id number] Date of Birth: February 08, 1935   Medicare Important Message Given:  Yes    Camillo Flaming 03/25/2015, 2:14 PM

## 2015-03-25 NOTE — Progress Notes (Signed)
Patient ID: Steven [name], male   DOB: [birth year], 80 y.o.   MRN: [id number]     [facility]      [address] [place], [place] [address]    Phone: [phone] FAX: [phone]     Subjective: No flatus.  No BM.  2590m dark bilious.  Objective:  Vital signs:  Filed Vitals:   03/24/15 0539 03/24/15 1345 03/24/15 2115 03/25/15 0546  BP: 121/53 136/62 131/57 115/63  Pulse: 58 62 65 56  Temp: 98 F (36.7 C) 98.8 F (37.1 C) 98.1 F (36.7 C) 97.6 F (36.4 C)  TempSrc: Oral Oral Oral Oral  Resp: '16 16 18 18  ' Height:      Weight:      SpO2: 100% 100% 100% 99%    Last BM Date: 03/21/15  Intake/Output   Yesterday:  12/11 0701 - 12/12 0700 In: 35397[P.O.:720; I.V.:2400; NG/GT:160] Out: 3375 [Urine:825; Emesis/NG output:2550] This shift: I/O last 3 completed shifts: In: 46734[P.O.:720; I.V.:3600; NG/GT:160] Out: 41937[Urine:1625; Emesis/NG output:2750]    Physical Exam: General: Pt awake/alert/oriented x4 in no acute distress Abdomen: Soft.  Distended.  ttp rlq.  No guarding.      Problem List:   Active Problems:   SBO (small bowel obstruction) (HCC)    Results:   Labs: Results for orders placed or performed during the hospital encounter of 03/21/15 (from the past 48 hour(s))  CBC     Status: Abnormal   Collection Time: 03/24/15  5:47 AM  Result Value Ref Range   WBC 4.6 4.0 - 10.5 K/uL   RBC 3.89 (L) 4.22 - 5.81 MIL/uL   Hemoglobin 10.5 (L) 13.0 - 17.0 g/dL   HCT 33.4 (L) 39.0 - 52.0 %   MCV 85.9 78.0 - 100.0 fL   MCH 27.0 26.0 - 34.0 pg   MCHC 31.4 30.0 - 36.0 g/dL   RDW 14.2 11.5 - 15.5 %   Platelets 243 150 - 400 K/uL  Basic metabolic panel     Status: Abnormal   Collection Time: 03/24/15  5:47 AM  Result Value Ref Range   Sodium 143 135 - 145 mmol/L   Potassium 3.8 3.5 - 5.1 mmol/L   Chloride 106 101 - 111 mmol/L   CO2 24 22 - 32 mmol/L   Glucose, Bld 71 65 - 99 mg/dL   BUN 13 6 - 20 mg/dL   Creatinine, Ser 0.69 0.61 - 1.24 mg/dL   Calcium 8.6 (L) 8.9 - 10.3 mg/dL   GFR calc non Af Amer >60 >60 mL/min   GFR calc Af Amer >60 >60 mL/min    Comment: (NOTE) The eGFR has been calculated using the CKD EPI equation. This calculation has not been validated in all clinical situations. eGFR's persistently <60 mL/min signify possible Chronic Kidney Disease.    Anion gap 13 5 - 15    Imaging / Studies: Dg Abd Portable 1v-small Bowel Obstruction Protocol-initial, 8 Hr Delay  03/25/2015  CLINICAL DATA:  Follow-up small bowel obstruction. 8 hours following administration of Gastroview via nasogastric tube. Initial encounter. EXAM: PORTABLE ABDOMEN - 1 VIEW COMPARISON:  Abdominal radiograph performed earlier today at 12:59 p.m. FINDINGS: There is persistent dilatation of small bowel loops up to 4.1 cm in maximal diameter, concerning for persistent small bowel obstruction. Contrast progresses through much of the small bowel, but no definite large bowel is characterized at this time. The patient's enteric tube is noted ending overlying the antrum  of the stomach. Clips are noted within the right upper quadrant, reflecting prior cholecystectomy. There is unusually medial positioning of the acetabular component of the patient's right femoral prosthesis, stable from prior studies, with resultant apparent chronic mild subluxation of the femoral head component, abutting the remaining superior rim of the acetabulum. Flowing osteophytes are noted along the lower thoracic and lumbar spine, concerning for ankylosing spondylitis. IMPRESSION: Persistent dilatation of small bowel loops up to 4.1 cm in maximal diameter, concerning for small bowel obstruction. No definite colonic loops characterized at this time. Would perform follow-up abdominal radiograph in 8 hours. Electronically Signed   By: Garald Balding M.D.   On: 03/25/2015 00:26   Dg Abd Portable 1v-small Bowel Protocol-position Verification  03/24/2015   CLINICAL DATA:  Check nasogastric catheter placement EXAM: PORTABLE ABDOMEN - 1 VIEW COMPARISON:  03/23/2015 FINDINGS: Nasogastric catheter is noted extending into the stomach and likely into the proximal duodenum. It is stable from the prior exam. The degree of small bowel dilatation has improved slightly in the interval from the prior exam. No free air is seen. The remainder the study is stable. IMPRESSION: Slight decrease in small bowel dilatation. The nasogastric catheter position is stable from the prior exam. Electronically Signed   By: Inez Catalina M.D.   On: 03/24/2015 13:27    Medications / Allergies:  Scheduled Meds: . antiseptic oral rinse  7 mL Mouth Rinse q12n4p  . chlorhexidine  15 mL Mouth Rinse BID  . heparin  5,000 Units Subcutaneous 3 times per day   Continuous Infusions: . 0.9 % NaCl with KCl 20 mEq / L 100 mL/hr at 03/25/15 0119   PRN Meds:.acetaminophen **OR** acetaminophen, diphenhydrAMINE **OR** diphenhydrAMINE, morphine injection, ondansetron (ZOFRAN) IV  Antibiotics: Anti-infectives    None        Assessment/Plan HD#4 SBO-8h delay without contrast in the colon.  Will check AXR now.  Doubt it will change much.  Likely needs a laparotomy and probably needs TPN started.  Pt is hesitant as are we given multiple surgeries in the past.  There are no indications for urgent surgical intervention at this time.  Will continue with NGT decompression, bowel rest and IV hydration    Erby Pian, ANP-BC [facility]   03/25/2015 8:49 AM

## 2015-03-26 NOTE — Discharge Summary (Signed)
Physician Discharge Summary  Steven [name] [id number] DOB: 17-Apr-1934 DOA: 03/21/2015  PCP: Thressa Sheller, MD  Consultation: none  Admit date: 03/21/2015 Discharge date: 03/27/2015  Recommendations for Outpatient Follow-up:   Follow-up Information    Follow up with [facility].   Specialty:  General Surgery   Why:  As needed   Contact information:   [place] [address] [phone]      Discharge Diagnoses:  1. SBO   Surgical Procedure: none  Discharge Condition: stable Disposition: home  Diet recommendation: regular  Filed Weights   03/21/15 0556  Weight: 68.04 kg (150 lb)    Filed Vitals:   03/26/15 2102 03/27/15 0525  BP: 118/78 134/68  Pulse: 63 58  Temp: 98.4 F (36.9 C) 97.8 F (36.6 C)  Resp: 18 16      Hospital Course: Steven [name] is a 80 year old male with a history of multiple abdominal surgeries including a colostomy subsequent reversal in the 1980s and multiple SBOs.  He presented to [facility] with abdominal pain, nausea and vomiting.  Abdominal CT showed a small bowel obstruction to level of the jejunum where there is a caliber changes.  The patient was admitted for gastric tube decompression and bowel rest.  After 3 days of above treatment, he was started on a small bowel obstruction protocol, contrast was seen the cecum on 24h films.  He subsequently began to have bowel movements and NGT output decreased.  Gastric tube was removed and diet was advanced.  He tolerated it well.  On HD#6 the patient was tolerating a diet, having BMs, no abdominal pain.  He was therefore felt stable for discharge home.  We discussed symptoms that warrant further evaluation.  He was encouraged to call with questions or concerns.     Physical Exam: General: Pt awake/alert/oriented x4 in no acute distress  Abdomen: Soft. Nondistended. Non tender. No evidence of peritonitis. No incarcerated hernias.   Discharge  Instructions     Medication List    STOP taking these medications        HYDROcodone-acetaminophen 5-325 MG tablet  Commonly known as:  NORCO/VICODIN      TAKE these medications        Biotin 1 MG Caps  Take 1 capsule by mouth daily.     CALCIUM 600+D 600-400 MG-UNIT tablet  Generic drug:  Calcium Carbonate-Vitamin D  Take 1 tablet by mouth 2 (two) times daily.     cholecalciferol 400 UNITS Tabs tablet  Commonly known as:  VITAMIN D  Take 1,000 Units by mouth daily.     citalopram 40 MG tablet  Commonly known as:  CELEXA  Take 40 mg by mouth daily.     fenofibrate 145 MG tablet  Commonly known as:  TRICOR  Take 145 mg by mouth every evening.     FOLBIC 2.5-25-2 MG Tabs tablet  Generic drug:  folic acid-pyridoxine-cyancobalamin  Take 1 tablet by mouth every evening.     meloxicam 7.5 MG tablet  Commonly known as:  MOBIC  Take 7.5 mg by mouth 2 (two) times daily.     PRESERVISION AREDS 2 PO  Take 1 tablet by mouth daily.           Follow-up Information    Follow up with [facility].   Specialty:  General Surgery   Why:  As needed   Contact information:   [address] [place] [address] [phone]  The results of significant diagnostics from this hospitalization (including imaging, microbiology, ancillary and laboratory) are listed below for reference.    Significant Diagnostic Studies: Ct Abdomen Pelvis W Contrast  03/21/2015  ADDENDUM REPORT: 03/21/2015 13:11 ADDENDUM: The acetabular component of the right hip replacement is rotated inferiorly with superior projection of the femoral head component with subluxation. Patient at risk for dislocation Electronically Signed   By: Genia Del M.D.   On: 03/21/2015 13:11  03/21/2015  CLINICAL DATA:  80 year old male with multiple prior small bowel resections presenting with distended abdomen with nausea. Post cholecystectomy and appendectomy. Prior colostomy with reversal.  Initial encounter. EXAM: CT ABDOMEN AND PELVIS WITH CONTRAST TECHNIQUE: Multidetector CT imaging of the abdomen and pelvis was performed using the standard protocol following bolus administration of intravenous contrast. CONTRAST:  118mL OMNIPAQUE IOHEXOL 300 MG/ML  SOLN COMPARISON:  04/10/2014 CT. FINDINGS: Dilated stomach and small bowel loops to level of the jejunum where there is a change in caliber. This may be related to presence of adhesions. No definitive mass identified although evaluation limited by streak artifact caused by a right hip implant. No focal bowel containing hernia. Prominence of small bowel loops left lower quadrant at prior resection site. Small amount of free fluid right lower quadrant. No free intraperitoneal air. Post colonic resection with reanastomosis left lower quadrant. Scattered colonic diverticula. Bilateral nonobstructing renal calculi with regions of scarring bilaterally. No hydronephrosis. Scattered renal low-density structures may be cysts although majority too small to characterize. Post cholecystectomy. No worrisome hepatic, splenic, pancreatic or adrenal lesion. Atherosclerotic type changes of the abdominal aorta with ectasia including bulge at the L3-4 level measuring up to 2.6 cm whereas just above this level the aorta measures 2.1 cm. This is without significant change from prior exam. Atherosclerotic type changes aortic branch vessels. Limited evaluation urinary bladder secondary streak artifact from right hip implant. Prostate gland appears be enlarged. Clinical and laboratory correlation recommended. No adenopathy. Lung bases clear. IMPRESSION: Prior colonic and small bowel resection. Small bowel obstruction to level of the jejunum where there is a change in caliber. This may be related to presence of adhesions. No definitive mass identified although evaluation limited by streak artifact caused by a right hip implant. No focal bowel containing hernia. Small amount of  free fluid right lower quadrant. No free intraperitoneal air. Bilateral nonobstructing renal calculi with regions of scarring bilaterally. No hydronephrosis. Scattered renal low-density structures may be cysts although majority too small to characterize. Atherosclerotic type changes of the abdominal aorta with ectasia including bulge at the L3-4 level measuring up to 2.6 cm whereas just above this level the aorta measures 2.1 cm. This is without significant change from prior exam. Prostate gland appears be enlarged. Clinical and laboratory correlation recommended. Electronically Signed: By: Genia Del M.D. On: 03/21/2015 09:42   Dg Abd 2 Views  03/23/2015  CLINICAL DATA:  Abdominal distension. EXAM: ABDOMEN - 2 VIEW COMPARISON:  Radiograph 03/21/2005 FINDINGS: NG tube extends the stomach. Stable air-fluid levels with mild dilatation of the proximal small bowel. No free air beneath the hemidiaphragms. Multiple surgical staples noted in the LEFT lower quadrant IMPRESSION: 1. No interval change. 2. Proximal small bowel obstruction.  No intraperitoneal free air. Electronically Signed   By: Suzy Bouchard M.D.   On: 03/23/2015 09:56   Dg Abd 2 Views  03/22/2015  CLINICAL DATA:  80 year old male with history of abdominal distention. History of small-bowel obstruction. EXAM: ABDOMEN - 2 VIEW COMPARISON:  Abdominal radiograph 03/21/2015.  FINDINGS: NG tube in position with tip terminating in the antral pre-pyloric region of the stomach or proximal duodenum. Dilated loop of small bowel on the left side of the abdomen with air-fluid level again noted. Minimal gas and stool noted in the colon. No pneumoperitoneum. Status post right total hip arthroplasty, with very vertically oriented acetabular component, and slight cephalad subluxation of the femoral head, similar to prior examinations. IMPRESSION: 1. Findings remain compatible with partial small bowel obstruction, as above. Electronically Signed   By: Vinnie Langton M.D.   On: 03/22/2015 08:35   Dg Abd Portable 1v  03/25/2015  CLINICAL DATA:  Small bowel obstruction.  History of renal calculi. EXAM: PORTABLE ABDOMEN - 1 VIEW COMPARISON:  Radiographs 03/24/2015. FINDINGS: 0916 hours. Nasogastric tube is unchanged in the right upper quadrant of the abdomen consistent with position at the pylorus or duodenum bulb. Small bowel distention has improved. Enteric contrast has diluted, probably extending into the cecum. Cholecystectomy clips, rotation of the right acetabular cup and diffuse changes of ankylosing spondylitis again noted. IMPRESSION: Mild improvement in small bowel distension following nasogastric tube decompression. Electronically Signed   By: Richardean Sale M.D.   On: 03/25/2015 09:32   Dg Abd Portable 1v-small Bowel Obstruction Protocol-initial, 8 Hr Delay  03/25/2015  CLINICAL DATA:  Follow-up small bowel obstruction. 8 hours following administration of Gastroview via nasogastric tube. Initial encounter. EXAM: PORTABLE ABDOMEN - 1 VIEW COMPARISON:  Abdominal radiograph performed earlier today at 12:59 p.m. FINDINGS: There is persistent dilatation of small bowel loops up to 4.1 cm in maximal diameter, concerning for persistent small bowel obstruction. Contrast progresses through much of the small bowel, but no definite large bowel is characterized at this time. The patient's enteric tube is noted ending overlying the antrum of the stomach. Clips are noted within the right upper quadrant, reflecting prior cholecystectomy. There is unusually medial positioning of the acetabular component of the patient's right femoral prosthesis, stable from prior studies, with resultant apparent chronic mild subluxation of the femoral head component, abutting the remaining superior rim of the acetabulum. Flowing osteophytes are noted along the lower thoracic and lumbar spine, concerning for ankylosing spondylitis. IMPRESSION: Persistent dilatation of small bowel loops  up to 4.1 cm in maximal diameter, concerning for small bowel obstruction. No definite colonic loops characterized at this time. Would perform follow-up abdominal radiograph in 8 hours. Electronically Signed   By: Garald Balding M.D.   On: 03/25/2015 00:26   Dg Abd Portable 1v-small Bowel Protocol-position Verification  03/24/2015  CLINICAL DATA:  Check nasogastric catheter placement EXAM: PORTABLE ABDOMEN - 1 VIEW COMPARISON:  03/23/2015 FINDINGS: Nasogastric catheter is noted extending into the stomach and likely into the proximal duodenum. It is stable from the prior exam. The degree of small bowel dilatation has improved slightly in the interval from the prior exam. No free air is seen. The remainder the study is stable. IMPRESSION: Slight decrease in small bowel dilatation. The nasogastric catheter position is stable from the prior exam. Electronically Signed   By: Inez Catalina M.D.   On: 03/24/2015 13:27   Dg Abd Portable 1v  03/21/2015  CLINICAL DATA:  80 year old male with nasogastric tube placement. Subsequent encounter. EXAM: PORTABLE ABDOMEN - 1 VIEW COMPARISON:  03/21/2015 CT. FINDINGS: Nasogastric tube has been placed. The side hole is at the level of gastroesophageal junction. Recommend advancing by 3.5 cm. Gas distended stomach. Fluid-filled dilated small bowel loops less appreciated on plain film exam. Residual contrast from recent CT.  The acetabular component of the right hip replacement is rotated inferiorly with superior projection of the femoral head component with subluxation. Patient at risk for dislocation. Ankylosis thoracic and lumbar spine. IMPRESSION: Nasogastric tube has been placed. The side hole is at the level of gastroesophageal junction. Recommend advancing by 3.5 cm. Gas distended stomach. Fluid-filled dilated small bowel loops less appreciated on plain film exam. Residual contrast from recent CT. The acetabular component of the right hip replacement is rotated inferiorly with  superior projection of the femoral head component with subluxation. Patient at risk for dislocation. Electronically Signed   By: Genia Del M.D.   On: 03/21/2015 13:10    Microbiology: No results found for this or any previous visit (from the past 240 hour(s)).   Labs: Basic Metabolic Panel:  Recent Labs Lab 03/21/15 0624 03/21/15 0644 03/22/15 0428 03/24/15 0547  NA 137 138 138 143  K 4.5 4.4 3.8 3.8  CL 98* 100* 103 106  CO2 27  --  25 24  GLUCOSE 167* 162* 100* 71  BUN 21* 22* 15 13  CREATININE 1.16 1.10 0.87 0.69  CALCIUM 10.2  --  8.8* 8.6*   Liver Function Tests:  Recent Labs Lab 03/21/15 0624  AST 25  ALT 18  ALKPHOS 51  BILITOT 1.1  PROT 8.6*  ALBUMIN 4.7    Recent Labs Lab 03/21/15 0624  LIPASE 42   No results for input(s): AMMONIA in the last 168 hours. CBC:  Recent Labs Lab 03/21/15 0624 03/21/15 0644 03/22/15 0428 03/24/15 0547  WBC 11.1*  --  6.2 4.6  NEUTROABS 9.5*  --   --   --   HGB 14.1 16.7 12.2* 10.5*  HCT 43.4 49.0 38.1* 33.4*  MCV 85.3  --  84.5 85.9  PLT 342  --  264 243   Cardiac Enzymes: No results for input(s): CKTOTAL, CKMB, CKMBINDEX, TROPONINI in the last 168 hours. BNP: BNP (last 3 results) No results for input(s): BNP in the last 8760 hours.  ProBNP (last 3 results) No results for input(s): PROBNP in the last 8760 hours.  CBG: No results for input(s): GLUCAP in the last 168 hours.  Active Problems:   SBO (small bowel obstruction) ([place])   Time coordinating discharge: <30 mins   Signed:  Raiyah Speakman, ANP-BC

## 2015-03-26 NOTE — Progress Notes (Signed)
Patient ID: Steven [name], male   DOB: December 06, 1934, 80 y.o.   MRN: [id number]     [facility]      [address] [place], [place] [id number]    Phone: [phone] FAX: [phone]     Subjective: +BM, flatus. No n/v.  357ml from NGT. No pain.   Objective:  Vital signs:  Filed Vitals:   03/24/15 2115 03/25/15 0546 03/25/15 2200 03/26/15 0600  BP: 131/57 115/63 139/66 126/76  Pulse: 65 56 61 58  Temp: 98.1 F (36.7 C) 97.6 F (36.4 C) 98.1 F (36.7 C) 97.7 F (36.5 C)  TempSrc: Oral Oral Oral Oral  Resp: 18 18 16 16   Height:      Weight:      SpO2: 100% 99% 100% 97%    Last BM Date: 03/25/15  Intake/Output   Yesterday:  12/12 0701 - 12/13 0700 In: 2430 [I.V.:2400; NG/GT:30] Out: 2025 [Urine:1675; Emesis/NG output:350] This shift: I/O last 3 completed shifts: In: [id number] [P.O.:720; I.V.:3500; NG/GT:70] Out: 4400 [Urine:2200; Emesis/NG output:2200]    Physical Exam: General: Pt awake/alert/oriented x4 in no acute distress  Abdomen: Soft.  Nondistended.  Non tender.  No evidence of peritonitis.  No incarcerated hernias.    Problem List:   Active Problems:   SBO (small bowel obstruction) ([facility])    Results:   Labs: No results found for this or any previous visit (from the past 15 hour(s)).  Imaging / Studies: Dg Abd Portable 1v  03/25/2015  CLINICAL DATA:  Small bowel obstruction.  History of renal calculi. EXAM: PORTABLE ABDOMEN - 1 VIEW COMPARISON:  Radiographs 03/24/2015. FINDINGS: 0916 hours. Nasogastric tube is unchanged in the right upper quadrant of the abdomen consistent with position at the pylorus or duodenum bulb. Small bowel distention has improved. Enteric contrast has diluted, probably extending into the cecum. Cholecystectomy clips, rotation of the right acetabular cup and diffuse changes of ankylosing spondylitis again noted. IMPRESSION: Mild improvement in small bowel distension following  nasogastric tube decompression. Electronically Signed   By: Richardean Sale M.D.   On: 03/25/2015 09:32   Dg Abd Portable 1v-small Bowel Obstruction Protocol-initial, 8 Hr Delay  03/25/2015  CLINICAL DATA:  Follow-up small bowel obstruction. 8 hours following administration of Gastroview via nasogastric tube. Initial encounter. EXAM: PORTABLE ABDOMEN - 1 VIEW COMPARISON:  Abdominal radiograph performed earlier today at 12:59 p.m. FINDINGS: There is persistent dilatation of small bowel loops up to 4.1 cm in maximal diameter, concerning for persistent small bowel obstruction. Contrast progresses through much of the small bowel, but no definite large bowel is characterized at this time. The patient's enteric tube is noted ending overlying the antrum of the stomach. Clips are noted within the right upper quadrant, reflecting prior cholecystectomy. There is unusually medial positioning of the acetabular component of the patient's right femoral prosthesis, stable from prior studies, with resultant apparent chronic mild subluxation of the femoral head component, abutting the remaining superior rim of the acetabulum. Flowing osteophytes are noted along the lower thoracic and lumbar spine, concerning for ankylosing spondylitis. IMPRESSION: Persistent dilatation of small bowel loops up to 4.1 cm in maximal diameter, concerning for small bowel obstruction. No definite colonic loops characterized at this time. Would perform follow-up abdominal radiograph in 8 hours. Electronically Signed   By: Garald Balding M.D.   On: 03/25/2015 00:26   Dg Abd Portable 1v-small Bowel Protocol-position Verification  03/24/2015  CLINICAL DATA:  Check nasogastric catheter placement EXAM: PORTABLE  ABDOMEN - 1 VIEW COMPARISON:  03/23/2015 FINDINGS: Nasogastric catheter is noted extending into the stomach and likely into the proximal duodenum. It is stable from the prior exam. The degree of small bowel dilatation has improved slightly in the  interval from the prior exam. No free air is seen. The remainder the study is stable. IMPRESSION: Slight decrease in small bowel dilatation. The nasogastric catheter position is stable from the prior exam. Electronically Signed   By: Inez Catalina M.D.   On: 03/24/2015 13:27    Medications / Allergies:  Scheduled Meds: . antiseptic oral rinse  7 mL Mouth Rinse q12n4p  . chlorhexidine  15 mL Mouth Rinse BID  . heparin  5,000 Units Subcutaneous 3 times per day   Continuous Infusions: . 0.9 % NaCl with KCl 20 mEq / L 100 mL/hr at 03/25/15 2220   PRN Meds:.acetaminophen **OR** acetaminophen, diphenhydrAMINE **OR** diphenhydrAMINE, morphine injection, ondansetron (ZOFRAN) IV  Antibiotics: Anti-infectives    None        Assessment/Plan HD#5 SBO-resolving clinically.  DC NGT and give clears, advance slowly. VTE prophylaxis-SCD/heparin FEN-reduce IVF Dispo-anticipate in AM  Erby Pian, ANP-BC [facility]   03/26/2015 8:00 AM

## 2015-03-27 NOTE — Progress Notes (Signed)
Patient is alert and oriented. VS stable. No s/s of acute distress. Tolerating diet well. No complaints of pain or discomfort. Patient being discharged home. Reviewed discharge information, education and medications. Patient states understanding. No questions at this time.

## 2015-03-27 NOTE — Discharge Instructions (Signed)
Small Bowel Obstruction °A small bowel obstruction is a blockage in the small bowel. The small bowel, which is also called the small intestine, is a long, slender tube that connects the stomach to the colon. When a person eats and drinks, food and fluids go from the stomach to the small bowel. This is where most of the nutrients in the food and fluids are absorbed. °A small bowel obstruction will prevent food and fluids from passing through the small bowel as they normally do during digestion. The small bowel can become partially or completely blocked. This can cause symptoms such as abdominal pain, vomiting, and bloating. If this condition is not treated, it can be dangerous because the small bowel could rupture. °CAUSES °Common causes of this condition include: °· Scar tissue from previous surgery or radiation treatment. °· Recent surgery. This may cause the movements of the bowel to slow down and cause food to block the intestine. °· Hernias. °· Inflammatory bowel disease (colitis). °· Twisting of the bowel (volvulus). °· Tumors. °· A foreign body. °· Slipping of a part of the bowel into another part (intussusception). °SYMPTOMS °Symptoms of this condition include: °· Abdominal pain. This may be dull cramps or sharp pain. It may occur in one area, or it may be present in the entire abdomen. Pain can range from mild to severe, depending on the degree of obstruction. °· Nausea and vomiting. Vomit may be greenish or a yellow bile color. °· Abdominal bloating. °· Constipation. °· Lack of passing gas. °· Frequent belching. °· Diarrhea. This may occur if the obstruction is partial and runny stool is able to leak around the obstruction. °DIAGNOSIS °This condition may be diagnosed based on a physical exam, medical history, and X-rays of the abdomen. You may also have other tests, such as a CT scan of the abdomen and pelvis. °TREATMENT °Treatment for this condition depends on the cause and severity of the problem.  Treatment options may include: °· Bed rest along with fluids and pain medicines that are given through an IV tube inserted into one of your veins. Sometimes, this is all that is needed for the obstruction to improve. °· Following a simple diet. In some cases, a clear liquid diet may be required for several days. This allows the bowel to rest. °· Placement of a small tube (nasogastric tube) into the stomach. When the bowel is blocked, it usually swells up like a balloon that is filled with air and fluids. The air and fluids may be removed by suction through the nasogastric tube. This can help with pain, discomfort, and nausea. It can also help the obstruction to clear up faster. °· Surgery. This may be required if other treatments do not work. Bowel obstruction from a hernia may require early surgery and can be an emergency procedure. Surgery may also be required for scar tissue that causes frequent or severe obstructions. °HOME CARE INSTRUCTIONS °· Get plenty of rest. °· Follow instructions from your health care provider about eating restrictions. You may need to avoid solid foods and consume only clear liquids until your condition improves. °· Take over-the-counter and prescription medicines only as told by your health care provider. °· Keep all follow-up visits as told by your health care provider. This is important. °SEEK MEDICAL CARE IF: °· You have a fever. °· You have chills. °SEEK IMMEDIATE MEDICAL CARE IF: °· You have increased pain or cramping. °· You vomit blood. °· You have uncontrolled vomiting or nausea. °· You cannot drink   fluids because of vomiting or pain. °· You develop confusion. °· You begin feeling very dry or thirsty (dehydrated). °· You have severe bloating. °· You feel extremely weak or you faint. °  °This information is not intended to replace advice given to you by your health care provider. Make sure you discuss any questions you have with your health care provider. °  °Document Released:  06/16/2005 Document Revised: 12/19/2014 Document Reviewed: 05/24/2014 °Elsevier Interactive Patient Education ©2016 [employer] ° ° °

## 2015-04-01 DIAGNOSIS — E785 Hyperlipidemia, unspecified: Secondary | ICD-10-CM | POA: Diagnosis not present

## 2015-04-01 DIAGNOSIS — E1122 Type 2 diabetes mellitus with diabetic chronic kidney disease: Secondary | ICD-10-CM | POA: Diagnosis not present

## 2015-04-01 DIAGNOSIS — T63441D Toxic effect of venom of bees, accidental (unintentional), subsequent encounter: Secondary | ICD-10-CM | POA: Diagnosis not present

## 2015-04-01 DIAGNOSIS — T63451D Toxic effect of venom of hornets, accidental (unintentional), subsequent encounter: Secondary | ICD-10-CM | POA: Diagnosis not present

## 2015-04-01 DIAGNOSIS — T63461D Toxic effect of venom of wasps, accidental (unintentional), subsequent encounter: Secondary | ICD-10-CM | POA: Diagnosis not present

## 2015-04-01 DIAGNOSIS — E559 Vitamin D deficiency, unspecified: Secondary | ICD-10-CM | POA: Diagnosis not present

## 2015-04-02 DIAGNOSIS — L821 Other seborrheic keratosis: Secondary | ICD-10-CM | POA: Diagnosis not present

## 2015-04-02 DIAGNOSIS — F17211 Nicotine dependence, cigarettes, in remission: Secondary | ICD-10-CM | POA: Diagnosis not present

## 2015-04-02 DIAGNOSIS — F339 Major depressive disorder, recurrent, unspecified: Secondary | ICD-10-CM | POA: Diagnosis not present

## 2015-04-02 DIAGNOSIS — K5669 Other intestinal obstruction: Secondary | ICD-10-CM | POA: Diagnosis not present

## 2015-04-02 DIAGNOSIS — E785 Hyperlipidemia, unspecified: Secondary | ICD-10-CM | POA: Diagnosis not present

## 2015-04-02 DIAGNOSIS — E1122 Type 2 diabetes mellitus with diabetic chronic kidney disease: Secondary | ICD-10-CM | POA: Diagnosis not present

## 2015-04-03 DIAGNOSIS — J3081 Allergic rhinitis due to animal (cat) (dog) hair and dander: Secondary | ICD-10-CM | POA: Diagnosis not present

## 2015-04-03 DIAGNOSIS — J301 Allergic rhinitis due to pollen: Secondary | ICD-10-CM | POA: Diagnosis not present

## 2015-04-03 DIAGNOSIS — J3089 Other allergic rhinitis: Secondary | ICD-10-CM | POA: Diagnosis not present

## 2015-04-10 DIAGNOSIS — J301 Allergic rhinitis due to pollen: Secondary | ICD-10-CM | POA: Diagnosis not present

## 2015-04-10 DIAGNOSIS — J3089 Other allergic rhinitis: Secondary | ICD-10-CM | POA: Diagnosis not present

## 2015-04-10 DIAGNOSIS — J3081 Allergic rhinitis due to animal (cat) (dog) hair and dander: Secondary | ICD-10-CM | POA: Diagnosis not present

## 2015-04-14 DIAGNOSIS — D649 Anemia, unspecified: Secondary | ICD-10-CM

## 2015-04-14 HISTORY — DX: Anemia, unspecified: D64.9

## 2015-04-25 DIAGNOSIS — J301 Allergic rhinitis due to pollen: Secondary | ICD-10-CM | POA: Diagnosis not present

## 2015-04-25 DIAGNOSIS — J3089 Other allergic rhinitis: Secondary | ICD-10-CM | POA: Diagnosis not present

## 2015-04-25 DIAGNOSIS — J3081 Allergic rhinitis due to animal (cat) (dog) hair and dander: Secondary | ICD-10-CM | POA: Diagnosis not present

## 2015-04-30 DIAGNOSIS — T63451D Toxic effect of venom of hornets, accidental (unintentional), subsequent encounter: Secondary | ICD-10-CM | POA: Diagnosis not present

## 2015-04-30 DIAGNOSIS — T63461D Toxic effect of venom of wasps, accidental (unintentional), subsequent encounter: Secondary | ICD-10-CM | POA: Diagnosis not present

## 2015-05-02 DIAGNOSIS — J301 Allergic rhinitis due to pollen: Secondary | ICD-10-CM | POA: Diagnosis not present

## 2015-05-02 DIAGNOSIS — J3089 Other allergic rhinitis: Secondary | ICD-10-CM | POA: Diagnosis not present

## 2015-05-02 DIAGNOSIS — J3081 Allergic rhinitis due to animal (cat) (dog) hair and dander: Secondary | ICD-10-CM | POA: Diagnosis not present

## 2015-05-07 DIAGNOSIS — J3081 Allergic rhinitis due to animal (cat) (dog) hair and dander: Secondary | ICD-10-CM | POA: Diagnosis not present

## 2015-05-07 DIAGNOSIS — J301 Allergic rhinitis due to pollen: Secondary | ICD-10-CM | POA: Diagnosis not present

## 2015-05-07 DIAGNOSIS — J3089 Other allergic rhinitis: Secondary | ICD-10-CM | POA: Diagnosis not present

## 2015-05-08 DIAGNOSIS — M7552 Bursitis of left shoulder: Secondary | ICD-10-CM | POA: Diagnosis not present

## 2015-05-08 DIAGNOSIS — Z791 Long term (current) use of non-steroidal anti-inflammatories (NSAID): Secondary | ICD-10-CM | POA: Diagnosis not present

## 2015-05-08 DIAGNOSIS — M7710 Lateral epicondylitis, unspecified elbow: Secondary | ICD-10-CM | POA: Diagnosis not present

## 2015-05-08 DIAGNOSIS — M459 Ankylosing spondylitis of unspecified sites in spine: Secondary | ICD-10-CM | POA: Diagnosis not present

## 2015-05-13 DIAGNOSIS — J3081 Allergic rhinitis due to animal (cat) (dog) hair and dander: Secondary | ICD-10-CM | POA: Diagnosis not present

## 2015-05-13 DIAGNOSIS — J301 Allergic rhinitis due to pollen: Secondary | ICD-10-CM | POA: Diagnosis not present

## 2015-05-13 DIAGNOSIS — J3089 Other allergic rhinitis: Secondary | ICD-10-CM | POA: Diagnosis not present

## 2015-05-20 DIAGNOSIS — J3089 Other allergic rhinitis: Secondary | ICD-10-CM | POA: Diagnosis not present

## 2015-05-20 DIAGNOSIS — J301 Allergic rhinitis due to pollen: Secondary | ICD-10-CM | POA: Diagnosis not present

## 2015-05-20 DIAGNOSIS — J3081 Allergic rhinitis due to animal (cat) (dog) hair and dander: Secondary | ICD-10-CM | POA: Diagnosis not present

## 2015-06-03 DIAGNOSIS — J3089 Other allergic rhinitis: Secondary | ICD-10-CM | POA: Diagnosis not present

## 2015-06-03 DIAGNOSIS — J301 Allergic rhinitis due to pollen: Secondary | ICD-10-CM | POA: Diagnosis not present

## 2015-06-03 DIAGNOSIS — J3081 Allergic rhinitis due to animal (cat) (dog) hair and dander: Secondary | ICD-10-CM | POA: Diagnosis not present

## 2015-06-05 DIAGNOSIS — T63451D Toxic effect of venom of hornets, accidental (unintentional), subsequent encounter: Secondary | ICD-10-CM | POA: Diagnosis not present

## 2015-06-05 DIAGNOSIS — T63461D Toxic effect of venom of wasps, accidental (unintentional), subsequent encounter: Secondary | ICD-10-CM | POA: Diagnosis not present

## 2015-06-07 DIAGNOSIS — J3081 Allergic rhinitis due to animal (cat) (dog) hair and dander: Secondary | ICD-10-CM | POA: Diagnosis not present

## 2015-06-07 DIAGNOSIS — J301 Allergic rhinitis due to pollen: Secondary | ICD-10-CM | POA: Diagnosis not present

## 2015-06-10 DIAGNOSIS — J3081 Allergic rhinitis due to animal (cat) (dog) hair and dander: Secondary | ICD-10-CM | POA: Diagnosis not present

## 2015-06-10 DIAGNOSIS — J301 Allergic rhinitis due to pollen: Secondary | ICD-10-CM | POA: Diagnosis not present

## 2015-06-10 DIAGNOSIS — J3089 Other allergic rhinitis: Secondary | ICD-10-CM | POA: Diagnosis not present

## 2015-06-21 DIAGNOSIS — J301 Allergic rhinitis due to pollen: Secondary | ICD-10-CM | POA: Diagnosis not present

## 2015-06-21 DIAGNOSIS — J3089 Other allergic rhinitis: Secondary | ICD-10-CM | POA: Diagnosis not present

## 2015-06-21 DIAGNOSIS — J3081 Allergic rhinitis due to animal (cat) (dog) hair and dander: Secondary | ICD-10-CM | POA: Diagnosis not present

## 2015-06-24 DIAGNOSIS — J3081 Allergic rhinitis due to animal (cat) (dog) hair and dander: Secondary | ICD-10-CM | POA: Diagnosis not present

## 2015-06-24 DIAGNOSIS — J301 Allergic rhinitis due to pollen: Secondary | ICD-10-CM | POA: Diagnosis not present

## 2015-06-24 DIAGNOSIS — J3089 Other allergic rhinitis: Secondary | ICD-10-CM | POA: Diagnosis not present

## 2015-06-27 DIAGNOSIS — M452 Ankylosing spondylitis of cervical region: Secondary | ICD-10-CM | POA: Diagnosis not present

## 2015-06-27 DIAGNOSIS — R040 Epistaxis: Secondary | ICD-10-CM | POA: Diagnosis not present

## 2015-06-27 DIAGNOSIS — J342 Deviated nasal septum: Secondary | ICD-10-CM | POA: Diagnosis not present

## 2015-06-27 DIAGNOSIS — J3089 Other allergic rhinitis: Secondary | ICD-10-CM | POA: Diagnosis not present

## 2015-07-09 DIAGNOSIS — T63461D Toxic effect of venom of wasps, accidental (unintentional), subsequent encounter: Secondary | ICD-10-CM | POA: Diagnosis not present

## 2015-07-09 DIAGNOSIS — T63451D Toxic effect of venom of hornets, accidental (unintentional), subsequent encounter: Secondary | ICD-10-CM | POA: Diagnosis not present

## 2015-07-12 DIAGNOSIS — J3081 Allergic rhinitis due to animal (cat) (dog) hair and dander: Secondary | ICD-10-CM | POA: Diagnosis not present

## 2015-07-12 DIAGNOSIS — J301 Allergic rhinitis due to pollen: Secondary | ICD-10-CM | POA: Diagnosis not present

## 2015-07-12 DIAGNOSIS — J3089 Other allergic rhinitis: Secondary | ICD-10-CM | POA: Diagnosis not present

## 2015-07-15 DIAGNOSIS — J3089 Other allergic rhinitis: Secondary | ICD-10-CM | POA: Diagnosis not present

## 2015-07-15 DIAGNOSIS — J301 Allergic rhinitis due to pollen: Secondary | ICD-10-CM | POA: Diagnosis not present

## 2015-07-15 DIAGNOSIS — J3081 Allergic rhinitis due to animal (cat) (dog) hair and dander: Secondary | ICD-10-CM | POA: Diagnosis not present

## 2015-07-19 DIAGNOSIS — J301 Allergic rhinitis due to pollen: Secondary | ICD-10-CM | POA: Diagnosis not present

## 2015-07-19 DIAGNOSIS — J3081 Allergic rhinitis due to animal (cat) (dog) hair and dander: Secondary | ICD-10-CM | POA: Diagnosis not present

## 2015-07-19 DIAGNOSIS — J3089 Other allergic rhinitis: Secondary | ICD-10-CM | POA: Diagnosis not present

## 2015-07-22 DIAGNOSIS — J3081 Allergic rhinitis due to animal (cat) (dog) hair and dander: Secondary | ICD-10-CM | POA: Diagnosis not present

## 2015-07-22 DIAGNOSIS — J3089 Other allergic rhinitis: Secondary | ICD-10-CM | POA: Diagnosis not present

## 2015-07-22 DIAGNOSIS — J301 Allergic rhinitis due to pollen: Secondary | ICD-10-CM | POA: Diagnosis not present

## 2015-07-29 DIAGNOSIS — J301 Allergic rhinitis due to pollen: Secondary | ICD-10-CM | POA: Diagnosis not present

## 2015-07-29 DIAGNOSIS — J3081 Allergic rhinitis due to animal (cat) (dog) hair and dander: Secondary | ICD-10-CM | POA: Diagnosis not present

## 2015-07-29 DIAGNOSIS — J3089 Other allergic rhinitis: Secondary | ICD-10-CM | POA: Diagnosis not present

## 2015-08-05 DIAGNOSIS — J3089 Other allergic rhinitis: Secondary | ICD-10-CM | POA: Diagnosis not present

## 2015-08-05 DIAGNOSIS — J3081 Allergic rhinitis due to animal (cat) (dog) hair and dander: Secondary | ICD-10-CM | POA: Diagnosis not present

## 2015-08-05 DIAGNOSIS — J301 Allergic rhinitis due to pollen: Secondary | ICD-10-CM | POA: Diagnosis not present

## 2015-08-12 DIAGNOSIS — J3089 Other allergic rhinitis: Secondary | ICD-10-CM | POA: Diagnosis not present

## 2015-08-12 DIAGNOSIS — J3081 Allergic rhinitis due to animal (cat) (dog) hair and dander: Secondary | ICD-10-CM | POA: Diagnosis not present

## 2015-08-12 DIAGNOSIS — J301 Allergic rhinitis due to pollen: Secondary | ICD-10-CM | POA: Diagnosis not present

## 2015-08-21 DIAGNOSIS — J301 Allergic rhinitis due to pollen: Secondary | ICD-10-CM | POA: Diagnosis not present

## 2015-08-21 DIAGNOSIS — J3089 Other allergic rhinitis: Secondary | ICD-10-CM | POA: Diagnosis not present

## 2015-08-26 DIAGNOSIS — J3089 Other allergic rhinitis: Secondary | ICD-10-CM | POA: Diagnosis not present

## 2015-08-26 DIAGNOSIS — J3081 Allergic rhinitis due to animal (cat) (dog) hair and dander: Secondary | ICD-10-CM | POA: Diagnosis not present

## 2015-08-26 DIAGNOSIS — J301 Allergic rhinitis due to pollen: Secondary | ICD-10-CM | POA: Diagnosis not present

## 2015-08-29 DIAGNOSIS — M459 Ankylosing spondylitis of unspecified sites in spine: Secondary | ICD-10-CM | POA: Diagnosis not present

## 2015-08-29 DIAGNOSIS — Z791 Long term (current) use of non-steroidal anti-inflammatories (NSAID): Secondary | ICD-10-CM | POA: Diagnosis not present

## 2015-09-11 DIAGNOSIS — T63461D Toxic effect of venom of wasps, accidental (unintentional), subsequent encounter: Secondary | ICD-10-CM | POA: Diagnosis not present

## 2015-09-11 DIAGNOSIS — T63451D Toxic effect of venom of hornets, accidental (unintentional), subsequent encounter: Secondary | ICD-10-CM | POA: Diagnosis not present

## 2015-09-18 DIAGNOSIS — J3081 Allergic rhinitis due to animal (cat) (dog) hair and dander: Secondary | ICD-10-CM | POA: Diagnosis not present

## 2015-09-18 DIAGNOSIS — J301 Allergic rhinitis due to pollen: Secondary | ICD-10-CM | POA: Diagnosis not present

## 2015-09-18 DIAGNOSIS — J3089 Other allergic rhinitis: Secondary | ICD-10-CM | POA: Diagnosis not present

## 2015-09-23 DIAGNOSIS — J3089 Other allergic rhinitis: Secondary | ICD-10-CM | POA: Diagnosis not present

## 2015-09-23 DIAGNOSIS — J3081 Allergic rhinitis due to animal (cat) (dog) hair and dander: Secondary | ICD-10-CM | POA: Diagnosis not present

## 2015-09-23 DIAGNOSIS — J301 Allergic rhinitis due to pollen: Secondary | ICD-10-CM | POA: Diagnosis not present

## 2015-09-26 DIAGNOSIS — J3081 Allergic rhinitis due to animal (cat) (dog) hair and dander: Secondary | ICD-10-CM | POA: Diagnosis not present

## 2015-09-30 DIAGNOSIS — J3081 Allergic rhinitis due to animal (cat) (dog) hair and dander: Secondary | ICD-10-CM | POA: Diagnosis not present

## 2015-09-30 DIAGNOSIS — J3089 Other allergic rhinitis: Secondary | ICD-10-CM | POA: Diagnosis not present

## 2015-09-30 DIAGNOSIS — J301 Allergic rhinitis due to pollen: Secondary | ICD-10-CM | POA: Diagnosis not present

## 2015-10-02 DIAGNOSIS — E785 Hyperlipidemia, unspecified: Secondary | ICD-10-CM | POA: Diagnosis not present

## 2015-10-02 DIAGNOSIS — Z0000 Encounter for general adult medical examination without abnormal findings: Secondary | ICD-10-CM | POA: Diagnosis not present

## 2015-10-02 DIAGNOSIS — E1122 Type 2 diabetes mellitus with diabetic chronic kidney disease: Secondary | ICD-10-CM | POA: Diagnosis not present

## 2015-10-02 DIAGNOSIS — Z1389 Encounter for screening for other disorder: Secondary | ICD-10-CM | POA: Diagnosis not present

## 2015-10-02 DIAGNOSIS — Z125 Encounter for screening for malignant neoplasm of prostate: Secondary | ICD-10-CM | POA: Diagnosis not present

## 2015-10-02 DIAGNOSIS — E291 Testicular hypofunction: Secondary | ICD-10-CM | POA: Diagnosis not present

## 2015-10-07 DIAGNOSIS — E785 Hyperlipidemia, unspecified: Secondary | ICD-10-CM | POA: Diagnosis not present

## 2015-10-07 DIAGNOSIS — N182 Chronic kidney disease, stage 2 (mild): Secondary | ICD-10-CM | POA: Diagnosis not present

## 2015-10-07 DIAGNOSIS — F17211 Nicotine dependence, cigarettes, in remission: Secondary | ICD-10-CM | POA: Diagnosis not present

## 2015-10-07 DIAGNOSIS — E1122 Type 2 diabetes mellitus with diabetic chronic kidney disease: Secondary | ICD-10-CM | POA: Diagnosis not present

## 2015-10-16 DIAGNOSIS — J3089 Other allergic rhinitis: Secondary | ICD-10-CM | POA: Diagnosis not present

## 2015-10-16 DIAGNOSIS — J301 Allergic rhinitis due to pollen: Secondary | ICD-10-CM | POA: Diagnosis not present

## 2015-10-16 DIAGNOSIS — J3081 Allergic rhinitis due to animal (cat) (dog) hair and dander: Secondary | ICD-10-CM | POA: Diagnosis not present

## 2015-10-21 DIAGNOSIS — J3081 Allergic rhinitis due to animal (cat) (dog) hair and dander: Secondary | ICD-10-CM | POA: Diagnosis not present

## 2015-10-21 DIAGNOSIS — J301 Allergic rhinitis due to pollen: Secondary | ICD-10-CM | POA: Diagnosis not present

## 2015-10-21 DIAGNOSIS — J3089 Other allergic rhinitis: Secondary | ICD-10-CM | POA: Diagnosis not present

## 2015-10-23 DIAGNOSIS — L821 Other seborrheic keratosis: Secondary | ICD-10-CM | POA: Diagnosis not present

## 2015-10-23 DIAGNOSIS — D225 Melanocytic nevi of trunk: Secondary | ICD-10-CM | POA: Diagnosis not present

## 2015-10-23 DIAGNOSIS — X32XXXA Exposure to sunlight, initial encounter: Secondary | ICD-10-CM | POA: Diagnosis not present

## 2015-10-23 DIAGNOSIS — T63451D Toxic effect of venom of hornets, accidental (unintentional), subsequent encounter: Secondary | ICD-10-CM | POA: Diagnosis not present

## 2015-10-23 DIAGNOSIS — T63461D Toxic effect of venom of wasps, accidental (unintentional), subsequent encounter: Secondary | ICD-10-CM | POA: Diagnosis not present

## 2015-10-23 DIAGNOSIS — L570 Actinic keratosis: Secondary | ICD-10-CM | POA: Diagnosis not present

## 2015-10-30 DIAGNOSIS — J3089 Other allergic rhinitis: Secondary | ICD-10-CM | POA: Diagnosis not present

## 2015-10-30 DIAGNOSIS — J301 Allergic rhinitis due to pollen: Secondary | ICD-10-CM | POA: Diagnosis not present

## 2015-10-30 DIAGNOSIS — J3081 Allergic rhinitis due to animal (cat) (dog) hair and dander: Secondary | ICD-10-CM | POA: Diagnosis not present

## 2015-11-04 DIAGNOSIS — J3081 Allergic rhinitis due to animal (cat) (dog) hair and dander: Secondary | ICD-10-CM | POA: Diagnosis not present

## 2015-11-04 DIAGNOSIS — J301 Allergic rhinitis due to pollen: Secondary | ICD-10-CM | POA: Diagnosis not present

## 2015-11-04 DIAGNOSIS — J3089 Other allergic rhinitis: Secondary | ICD-10-CM | POA: Diagnosis not present

## 2015-11-11 DIAGNOSIS — J301 Allergic rhinitis due to pollen: Secondary | ICD-10-CM | POA: Diagnosis not present

## 2015-11-11 DIAGNOSIS — J3089 Other allergic rhinitis: Secondary | ICD-10-CM | POA: Diagnosis not present

## 2015-11-11 DIAGNOSIS — J3081 Allergic rhinitis due to animal (cat) (dog) hair and dander: Secondary | ICD-10-CM | POA: Diagnosis not present

## 2015-11-18 DIAGNOSIS — J301 Allergic rhinitis due to pollen: Secondary | ICD-10-CM | POA: Diagnosis not present

## 2015-11-18 DIAGNOSIS — J3089 Other allergic rhinitis: Secondary | ICD-10-CM | POA: Diagnosis not present

## 2015-11-18 DIAGNOSIS — J3081 Allergic rhinitis due to animal (cat) (dog) hair and dander: Secondary | ICD-10-CM | POA: Diagnosis not present

## 2015-12-02 DIAGNOSIS — J3089 Other allergic rhinitis: Secondary | ICD-10-CM | POA: Diagnosis not present

## 2015-12-02 DIAGNOSIS — J301 Allergic rhinitis due to pollen: Secondary | ICD-10-CM | POA: Diagnosis not present

## 2015-12-02 DIAGNOSIS — J3081 Allergic rhinitis due to animal (cat) (dog) hair and dander: Secondary | ICD-10-CM | POA: Diagnosis not present

## 2015-12-03 DIAGNOSIS — J3081 Allergic rhinitis due to animal (cat) (dog) hair and dander: Secondary | ICD-10-CM | POA: Diagnosis not present

## 2015-12-03 DIAGNOSIS — J301 Allergic rhinitis due to pollen: Secondary | ICD-10-CM | POA: Diagnosis not present

## 2015-12-03 DIAGNOSIS — J3089 Other allergic rhinitis: Secondary | ICD-10-CM | POA: Diagnosis not present

## 2015-12-11 DIAGNOSIS — T63451D Toxic effect of venom of hornets, accidental (unintentional), subsequent encounter: Secondary | ICD-10-CM | POA: Diagnosis not present

## 2015-12-11 DIAGNOSIS — T63461D Toxic effect of venom of wasps, accidental (unintentional), subsequent encounter: Secondary | ICD-10-CM | POA: Diagnosis not present

## 2015-12-13 DIAGNOSIS — J3089 Other allergic rhinitis: Secondary | ICD-10-CM | POA: Diagnosis not present

## 2015-12-13 DIAGNOSIS — J301 Allergic rhinitis due to pollen: Secondary | ICD-10-CM | POA: Diagnosis not present

## 2015-12-13 DIAGNOSIS — J3081 Allergic rhinitis due to animal (cat) (dog) hair and dander: Secondary | ICD-10-CM | POA: Diagnosis not present

## 2015-12-23 DIAGNOSIS — J3089 Other allergic rhinitis: Secondary | ICD-10-CM | POA: Diagnosis not present

## 2015-12-23 DIAGNOSIS — J301 Allergic rhinitis due to pollen: Secondary | ICD-10-CM | POA: Diagnosis not present

## 2015-12-23 DIAGNOSIS — J3081 Allergic rhinitis due to animal (cat) (dog) hair and dander: Secondary | ICD-10-CM | POA: Diagnosis not present

## 2015-12-30 DIAGNOSIS — J301 Allergic rhinitis due to pollen: Secondary | ICD-10-CM | POA: Diagnosis not present

## 2015-12-30 DIAGNOSIS — J3081 Allergic rhinitis due to animal (cat) (dog) hair and dander: Secondary | ICD-10-CM | POA: Diagnosis not present

## 2015-12-30 DIAGNOSIS — J3089 Other allergic rhinitis: Secondary | ICD-10-CM | POA: Diagnosis not present

## 2016-01-03 DIAGNOSIS — J3081 Allergic rhinitis due to animal (cat) (dog) hair and dander: Secondary | ICD-10-CM | POA: Diagnosis not present

## 2016-01-03 DIAGNOSIS — J3089 Other allergic rhinitis: Secondary | ICD-10-CM | POA: Diagnosis not present

## 2016-01-03 DIAGNOSIS — J301 Allergic rhinitis due to pollen: Secondary | ICD-10-CM | POA: Diagnosis not present

## 2016-01-06 DIAGNOSIS — J3089 Other allergic rhinitis: Secondary | ICD-10-CM | POA: Diagnosis not present

## 2016-01-06 DIAGNOSIS — J301 Allergic rhinitis due to pollen: Secondary | ICD-10-CM | POA: Diagnosis not present

## 2016-01-06 DIAGNOSIS — J3081 Allergic rhinitis due to animal (cat) (dog) hair and dander: Secondary | ICD-10-CM | POA: Diagnosis not present

## 2016-01-08 DIAGNOSIS — J301 Allergic rhinitis due to pollen: Secondary | ICD-10-CM | POA: Diagnosis not present

## 2016-01-08 DIAGNOSIS — J3089 Other allergic rhinitis: Secondary | ICD-10-CM | POA: Diagnosis not present

## 2016-01-08 DIAGNOSIS — J3081 Allergic rhinitis due to animal (cat) (dog) hair and dander: Secondary | ICD-10-CM | POA: Diagnosis not present

## 2016-01-13 DIAGNOSIS — J3081 Allergic rhinitis due to animal (cat) (dog) hair and dander: Secondary | ICD-10-CM | POA: Diagnosis not present

## 2016-01-13 DIAGNOSIS — J301 Allergic rhinitis due to pollen: Secondary | ICD-10-CM | POA: Diagnosis not present

## 2016-01-13 DIAGNOSIS — J3089 Other allergic rhinitis: Secondary | ICD-10-CM | POA: Diagnosis not present

## 2016-01-15 DIAGNOSIS — J3089 Other allergic rhinitis: Secondary | ICD-10-CM | POA: Diagnosis not present

## 2016-01-15 DIAGNOSIS — J301 Allergic rhinitis due to pollen: Secondary | ICD-10-CM | POA: Diagnosis not present

## 2016-01-15 DIAGNOSIS — J3081 Allergic rhinitis due to animal (cat) (dog) hair and dander: Secondary | ICD-10-CM | POA: Diagnosis not present

## 2016-01-20 DIAGNOSIS — J3081 Allergic rhinitis due to animal (cat) (dog) hair and dander: Secondary | ICD-10-CM | POA: Diagnosis not present

## 2016-01-20 DIAGNOSIS — J301 Allergic rhinitis due to pollen: Secondary | ICD-10-CM | POA: Diagnosis not present

## 2016-01-20 DIAGNOSIS — J3089 Other allergic rhinitis: Secondary | ICD-10-CM | POA: Diagnosis not present

## 2016-01-22 DIAGNOSIS — J301 Allergic rhinitis due to pollen: Secondary | ICD-10-CM | POA: Diagnosis not present

## 2016-01-22 DIAGNOSIS — J3081 Allergic rhinitis due to animal (cat) (dog) hair and dander: Secondary | ICD-10-CM | POA: Diagnosis not present

## 2016-01-22 DIAGNOSIS — T63441A Toxic effect of venom of bees, accidental (unintentional), initial encounter: Secondary | ICD-10-CM | POA: Diagnosis not present

## 2016-01-22 DIAGNOSIS — J3089 Other allergic rhinitis: Secondary | ICD-10-CM | POA: Diagnosis not present

## 2016-01-22 DIAGNOSIS — Z23 Encounter for immunization: Secondary | ICD-10-CM | POA: Diagnosis not present

## 2016-01-24 DIAGNOSIS — J3089 Other allergic rhinitis: Secondary | ICD-10-CM | POA: Diagnosis not present

## 2016-01-27 DIAGNOSIS — J3081 Allergic rhinitis due to animal (cat) (dog) hair and dander: Secondary | ICD-10-CM | POA: Diagnosis not present

## 2016-01-27 DIAGNOSIS — J301 Allergic rhinitis due to pollen: Secondary | ICD-10-CM | POA: Diagnosis not present

## 2016-01-27 DIAGNOSIS — J3089 Other allergic rhinitis: Secondary | ICD-10-CM | POA: Diagnosis not present

## 2016-02-03 DIAGNOSIS — J3081 Allergic rhinitis due to animal (cat) (dog) hair and dander: Secondary | ICD-10-CM | POA: Diagnosis not present

## 2016-02-03 DIAGNOSIS — M545 Low back pain: Secondary | ICD-10-CM | POA: Diagnosis not present

## 2016-02-03 DIAGNOSIS — M19011 Primary osteoarthritis, right shoulder: Secondary | ICD-10-CM | POA: Diagnosis not present

## 2016-02-03 DIAGNOSIS — Z791 Long term (current) use of non-steroidal anti-inflammatories (NSAID): Secondary | ICD-10-CM | POA: Diagnosis not present

## 2016-02-03 DIAGNOSIS — M25511 Pain in right shoulder: Secondary | ICD-10-CM | POA: Diagnosis not present

## 2016-02-03 DIAGNOSIS — M19012 Primary osteoarthritis, left shoulder: Secondary | ICD-10-CM | POA: Diagnosis not present

## 2016-02-03 DIAGNOSIS — J301 Allergic rhinitis due to pollen: Secondary | ICD-10-CM | POA: Diagnosis not present

## 2016-02-03 DIAGNOSIS — J3089 Other allergic rhinitis: Secondary | ICD-10-CM | POA: Diagnosis not present

## 2016-02-03 DIAGNOSIS — M459 Ankylosing spondylitis of unspecified sites in spine: Secondary | ICD-10-CM | POA: Diagnosis not present

## 2016-02-13 DIAGNOSIS — T63461D Toxic effect of venom of wasps, accidental (unintentional), subsequent encounter: Secondary | ICD-10-CM | POA: Diagnosis not present

## 2016-02-13 DIAGNOSIS — T63441D Toxic effect of venom of bees, accidental (unintentional), subsequent encounter: Secondary | ICD-10-CM | POA: Diagnosis not present

## 2016-02-13 DIAGNOSIS — T63451D Toxic effect of venom of hornets, accidental (unintentional), subsequent encounter: Secondary | ICD-10-CM | POA: Diagnosis not present

## 2016-02-19 DIAGNOSIS — A084 Viral intestinal infection, unspecified: Secondary | ICD-10-CM | POA: Diagnosis not present

## 2016-02-26 DIAGNOSIS — T63451D Toxic effect of venom of hornets, accidental (unintentional), subsequent encounter: Secondary | ICD-10-CM | POA: Diagnosis not present

## 2016-02-26 DIAGNOSIS — T63441D Toxic effect of venom of bees, accidental (unintentional), subsequent encounter: Secondary | ICD-10-CM | POA: Diagnosis not present

## 2016-02-26 DIAGNOSIS — T63461D Toxic effect of venom of wasps, accidental (unintentional), subsequent encounter: Secondary | ICD-10-CM | POA: Diagnosis not present

## 2016-02-28 DIAGNOSIS — J301 Allergic rhinitis due to pollen: Secondary | ICD-10-CM | POA: Diagnosis not present

## 2016-02-28 DIAGNOSIS — J3081 Allergic rhinitis due to animal (cat) (dog) hair and dander: Secondary | ICD-10-CM | POA: Diagnosis not present

## 2016-02-28 DIAGNOSIS — J3089 Other allergic rhinitis: Secondary | ICD-10-CM | POA: Diagnosis not present

## 2016-03-02 DIAGNOSIS — J3089 Other allergic rhinitis: Secondary | ICD-10-CM | POA: Diagnosis not present

## 2016-03-02 DIAGNOSIS — J3081 Allergic rhinitis due to animal (cat) (dog) hair and dander: Secondary | ICD-10-CM | POA: Diagnosis not present

## 2016-03-02 DIAGNOSIS — J301 Allergic rhinitis due to pollen: Secondary | ICD-10-CM | POA: Diagnosis not present

## 2016-03-03 DIAGNOSIS — T63441D Toxic effect of venom of bees, accidental (unintentional), subsequent encounter: Secondary | ICD-10-CM | POA: Diagnosis not present

## 2016-03-09 DIAGNOSIS — H2513 Age-related nuclear cataract, bilateral: Secondary | ICD-10-CM | POA: Diagnosis not present

## 2016-03-10 DIAGNOSIS — H35711 Central serous chorioretinopathy, right eye: Secondary | ICD-10-CM | POA: Diagnosis not present

## 2016-03-10 DIAGNOSIS — M452 Ankylosing spondylitis of cervical region: Secondary | ICD-10-CM | POA: Diagnosis not present

## 2016-03-10 DIAGNOSIS — H2513 Age-related nuclear cataract, bilateral: Secondary | ICD-10-CM | POA: Diagnosis not present

## 2016-03-11 DIAGNOSIS — T63451D Toxic effect of venom of hornets, accidental (unintentional), subsequent encounter: Secondary | ICD-10-CM | POA: Diagnosis not present

## 2016-03-11 DIAGNOSIS — T63461D Toxic effect of venom of wasps, accidental (unintentional), subsequent encounter: Secondary | ICD-10-CM | POA: Diagnosis not present

## 2016-03-11 DIAGNOSIS — T63441D Toxic effect of venom of bees, accidental (unintentional), subsequent encounter: Secondary | ICD-10-CM | POA: Diagnosis not present

## 2016-03-13 DIAGNOSIS — J301 Allergic rhinitis due to pollen: Secondary | ICD-10-CM | POA: Diagnosis not present

## 2016-03-13 DIAGNOSIS — J3089 Other allergic rhinitis: Secondary | ICD-10-CM | POA: Diagnosis not present

## 2016-03-13 DIAGNOSIS — J3081 Allergic rhinitis due to animal (cat) (dog) hair and dander: Secondary | ICD-10-CM | POA: Diagnosis not present

## 2016-03-16 DIAGNOSIS — J3081 Allergic rhinitis due to animal (cat) (dog) hair and dander: Secondary | ICD-10-CM | POA: Diagnosis not present

## 2016-03-16 DIAGNOSIS — J301 Allergic rhinitis due to pollen: Secondary | ICD-10-CM | POA: Diagnosis not present

## 2016-03-16 DIAGNOSIS — J3089 Other allergic rhinitis: Secondary | ICD-10-CM | POA: Diagnosis not present

## 2016-03-23 DIAGNOSIS — J3081 Allergic rhinitis due to animal (cat) (dog) hair and dander: Secondary | ICD-10-CM | POA: Diagnosis not present

## 2016-03-23 DIAGNOSIS — J301 Allergic rhinitis due to pollen: Secondary | ICD-10-CM | POA: Diagnosis not present

## 2016-03-23 DIAGNOSIS — J3089 Other allergic rhinitis: Secondary | ICD-10-CM | POA: Diagnosis not present

## 2016-03-25 DIAGNOSIS — T63451D Toxic effect of venom of hornets, accidental (unintentional), subsequent encounter: Secondary | ICD-10-CM | POA: Diagnosis not present

## 2016-03-25 DIAGNOSIS — T63461D Toxic effect of venom of wasps, accidental (unintentional), subsequent encounter: Secondary | ICD-10-CM | POA: Diagnosis not present

## 2016-03-25 DIAGNOSIS — T63441D Toxic effect of venom of bees, accidental (unintentional), subsequent encounter: Secondary | ICD-10-CM | POA: Diagnosis not present

## 2016-03-30 DIAGNOSIS — J301 Allergic rhinitis due to pollen: Secondary | ICD-10-CM | POA: Diagnosis not present

## 2016-03-30 DIAGNOSIS — J3089 Other allergic rhinitis: Secondary | ICD-10-CM | POA: Diagnosis not present

## 2016-03-30 DIAGNOSIS — J3081 Allergic rhinitis due to animal (cat) (dog) hair and dander: Secondary | ICD-10-CM | POA: Diagnosis not present

## 2016-04-01 DIAGNOSIS — T63461D Toxic effect of venom of wasps, accidental (unintentional), subsequent encounter: Secondary | ICD-10-CM | POA: Diagnosis not present

## 2016-04-01 DIAGNOSIS — T63451D Toxic effect of venom of hornets, accidental (unintentional), subsequent encounter: Secondary | ICD-10-CM | POA: Diagnosis not present

## 2016-04-01 DIAGNOSIS — T63441D Toxic effect of venom of bees, accidental (unintentional), subsequent encounter: Secondary | ICD-10-CM | POA: Diagnosis not present

## 2016-04-01 DIAGNOSIS — E1122 Type 2 diabetes mellitus with diabetic chronic kidney disease: Secondary | ICD-10-CM | POA: Diagnosis not present

## 2016-04-01 DIAGNOSIS — E559 Vitamin D deficiency, unspecified: Secondary | ICD-10-CM | POA: Diagnosis not present

## 2016-04-01 DIAGNOSIS — E785 Hyperlipidemia, unspecified: Secondary | ICD-10-CM | POA: Diagnosis not present

## 2016-04-01 DIAGNOSIS — E291 Testicular hypofunction: Secondary | ICD-10-CM | POA: Diagnosis not present

## 2016-04-15 DIAGNOSIS — J301 Allergic rhinitis due to pollen: Secondary | ICD-10-CM | POA: Diagnosis not present

## 2016-04-15 DIAGNOSIS — J3089 Other allergic rhinitis: Secondary | ICD-10-CM | POA: Diagnosis not present

## 2016-04-15 DIAGNOSIS — J3081 Allergic rhinitis due to animal (cat) (dog) hair and dander: Secondary | ICD-10-CM | POA: Diagnosis not present

## 2016-04-20 DIAGNOSIS — J301 Allergic rhinitis due to pollen: Secondary | ICD-10-CM | POA: Diagnosis not present

## 2016-04-20 DIAGNOSIS — J3081 Allergic rhinitis due to animal (cat) (dog) hair and dander: Secondary | ICD-10-CM | POA: Diagnosis not present

## 2016-04-20 DIAGNOSIS — J3089 Other allergic rhinitis: Secondary | ICD-10-CM | POA: Diagnosis not present

## 2016-04-22 DIAGNOSIS — E7800 Pure hypercholesterolemia, unspecified: Secondary | ICD-10-CM | POA: Diagnosis not present

## 2016-04-22 DIAGNOSIS — E119 Type 2 diabetes mellitus without complications: Secondary | ICD-10-CM | POA: Diagnosis not present

## 2016-04-24 DIAGNOSIS — J3089 Other allergic rhinitis: Secondary | ICD-10-CM | POA: Diagnosis not present

## 2016-04-24 DIAGNOSIS — J3081 Allergic rhinitis due to animal (cat) (dog) hair and dander: Secondary | ICD-10-CM | POA: Diagnosis not present

## 2016-04-24 DIAGNOSIS — J301 Allergic rhinitis due to pollen: Secondary | ICD-10-CM | POA: Diagnosis not present

## 2016-04-27 DIAGNOSIS — T63461D Toxic effect of venom of wasps, accidental (unintentional), subsequent encounter: Secondary | ICD-10-CM | POA: Diagnosis not present

## 2016-04-27 DIAGNOSIS — T63441D Toxic effect of venom of bees, accidental (unintentional), subsequent encounter: Secondary | ICD-10-CM | POA: Diagnosis not present

## 2016-04-27 DIAGNOSIS — T63451D Toxic effect of venom of hornets, accidental (unintentional), subsequent encounter: Secondary | ICD-10-CM | POA: Diagnosis not present

## 2016-05-04 DIAGNOSIS — L570 Actinic keratosis: Secondary | ICD-10-CM | POA: Diagnosis not present

## 2016-05-04 DIAGNOSIS — J301 Allergic rhinitis due to pollen: Secondary | ICD-10-CM | POA: Diagnosis not present

## 2016-05-04 DIAGNOSIS — J3081 Allergic rhinitis due to animal (cat) (dog) hair and dander: Secondary | ICD-10-CM | POA: Diagnosis not present

## 2016-05-04 DIAGNOSIS — X32XXXD Exposure to sunlight, subsequent encounter: Secondary | ICD-10-CM | POA: Diagnosis not present

## 2016-05-04 DIAGNOSIS — J3089 Other allergic rhinitis: Secondary | ICD-10-CM | POA: Diagnosis not present

## 2016-05-07 DIAGNOSIS — T63451D Toxic effect of venom of hornets, accidental (unintentional), subsequent encounter: Secondary | ICD-10-CM | POA: Diagnosis not present

## 2016-05-07 DIAGNOSIS — T63441D Toxic effect of venom of bees, accidental (unintentional), subsequent encounter: Secondary | ICD-10-CM | POA: Diagnosis not present

## 2016-05-07 DIAGNOSIS — T63461D Toxic effect of venom of wasps, accidental (unintentional), subsequent encounter: Secondary | ICD-10-CM | POA: Diagnosis not present

## 2016-05-11 DIAGNOSIS — J301 Allergic rhinitis due to pollen: Secondary | ICD-10-CM | POA: Diagnosis not present

## 2016-05-11 DIAGNOSIS — J3081 Allergic rhinitis due to animal (cat) (dog) hair and dander: Secondary | ICD-10-CM | POA: Diagnosis not present

## 2016-05-11 DIAGNOSIS — J3089 Other allergic rhinitis: Secondary | ICD-10-CM | POA: Diagnosis not present

## 2016-05-13 DIAGNOSIS — H2513 Age-related nuclear cataract, bilateral: Secondary | ICD-10-CM | POA: Diagnosis not present

## 2016-05-13 DIAGNOSIS — T63451D Toxic effect of venom of hornets, accidental (unintentional), subsequent encounter: Secondary | ICD-10-CM | POA: Diagnosis not present

## 2016-05-13 DIAGNOSIS — H35711 Central serous chorioretinopathy, right eye: Secondary | ICD-10-CM | POA: Diagnosis not present

## 2016-05-13 DIAGNOSIS — T63461D Toxic effect of venom of wasps, accidental (unintentional), subsequent encounter: Secondary | ICD-10-CM | POA: Diagnosis not present

## 2016-05-13 DIAGNOSIS — T63441D Toxic effect of venom of bees, accidental (unintentional), subsequent encounter: Secondary | ICD-10-CM | POA: Diagnosis not present

## 2016-05-19 DIAGNOSIS — T63461D Toxic effect of venom of wasps, accidental (unintentional), subsequent encounter: Secondary | ICD-10-CM | POA: Diagnosis not present

## 2016-05-19 DIAGNOSIS — T63451D Toxic effect of venom of hornets, accidental (unintentional), subsequent encounter: Secondary | ICD-10-CM | POA: Diagnosis not present

## 2016-05-19 DIAGNOSIS — T63441D Toxic effect of venom of bees, accidental (unintentional), subsequent encounter: Secondary | ICD-10-CM | POA: Diagnosis not present

## 2016-05-21 DIAGNOSIS — J301 Allergic rhinitis due to pollen: Secondary | ICD-10-CM | POA: Diagnosis not present

## 2016-05-21 DIAGNOSIS — J3089 Other allergic rhinitis: Secondary | ICD-10-CM | POA: Diagnosis not present

## 2016-05-21 DIAGNOSIS — J3081 Allergic rhinitis due to animal (cat) (dog) hair and dander: Secondary | ICD-10-CM | POA: Diagnosis not present

## 2016-05-25 DIAGNOSIS — J3081 Allergic rhinitis due to animal (cat) (dog) hair and dander: Secondary | ICD-10-CM | POA: Diagnosis not present

## 2016-05-25 DIAGNOSIS — J301 Allergic rhinitis due to pollen: Secondary | ICD-10-CM | POA: Diagnosis not present

## 2016-05-25 DIAGNOSIS — J3089 Other allergic rhinitis: Secondary | ICD-10-CM | POA: Diagnosis not present

## 2016-05-29 DIAGNOSIS — T63441D Toxic effect of venom of bees, accidental (unintentional), subsequent encounter: Secondary | ICD-10-CM | POA: Diagnosis not present

## 2016-06-02 DIAGNOSIS — J3089 Other allergic rhinitis: Secondary | ICD-10-CM | POA: Diagnosis not present

## 2016-06-02 DIAGNOSIS — J301 Allergic rhinitis due to pollen: Secondary | ICD-10-CM | POA: Diagnosis not present

## 2016-06-02 DIAGNOSIS — J3081 Allergic rhinitis due to animal (cat) (dog) hair and dander: Secondary | ICD-10-CM | POA: Diagnosis not present

## 2016-06-05 DIAGNOSIS — J3089 Other allergic rhinitis: Secondary | ICD-10-CM | POA: Diagnosis not present

## 2016-06-05 DIAGNOSIS — J3081 Allergic rhinitis due to animal (cat) (dog) hair and dander: Secondary | ICD-10-CM | POA: Diagnosis not present

## 2016-06-05 DIAGNOSIS — T63441D Toxic effect of venom of bees, accidental (unintentional), subsequent encounter: Secondary | ICD-10-CM | POA: Diagnosis not present

## 2016-06-05 DIAGNOSIS — J301 Allergic rhinitis due to pollen: Secondary | ICD-10-CM | POA: Diagnosis not present

## 2016-06-08 DIAGNOSIS — J3089 Other allergic rhinitis: Secondary | ICD-10-CM | POA: Diagnosis not present

## 2016-06-08 DIAGNOSIS — J3081 Allergic rhinitis due to animal (cat) (dog) hair and dander: Secondary | ICD-10-CM | POA: Diagnosis not present

## 2016-06-08 DIAGNOSIS — J301 Allergic rhinitis due to pollen: Secondary | ICD-10-CM | POA: Diagnosis not present

## 2016-06-10 DIAGNOSIS — H2513 Age-related nuclear cataract, bilateral: Secondary | ICD-10-CM | POA: Diagnosis not present

## 2016-06-11 DIAGNOSIS — T63441D Toxic effect of venom of bees, accidental (unintentional), subsequent encounter: Secondary | ICD-10-CM | POA: Diagnosis not present

## 2016-06-15 DIAGNOSIS — J3089 Other allergic rhinitis: Secondary | ICD-10-CM | POA: Diagnosis not present

## 2016-06-15 DIAGNOSIS — J3081 Allergic rhinitis due to animal (cat) (dog) hair and dander: Secondary | ICD-10-CM | POA: Diagnosis not present

## 2016-06-15 DIAGNOSIS — J301 Allergic rhinitis due to pollen: Secondary | ICD-10-CM | POA: Diagnosis not present

## 2016-06-18 DIAGNOSIS — H25811 Combined forms of age-related cataract, right eye: Secondary | ICD-10-CM | POA: Diagnosis not present

## 2016-06-18 DIAGNOSIS — E118 Type 2 diabetes mellitus with unspecified complications: Secondary | ICD-10-CM | POA: Diagnosis not present

## 2016-06-19 DIAGNOSIS — T63441D Toxic effect of venom of bees, accidental (unintentional), subsequent encounter: Secondary | ICD-10-CM | POA: Diagnosis not present

## 2016-06-19 DIAGNOSIS — T63451D Toxic effect of venom of hornets, accidental (unintentional), subsequent encounter: Secondary | ICD-10-CM | POA: Diagnosis not present

## 2016-06-19 DIAGNOSIS — T63461D Toxic effect of venom of wasps, accidental (unintentional), subsequent encounter: Secondary | ICD-10-CM | POA: Diagnosis not present

## 2016-06-23 DIAGNOSIS — J301 Allergic rhinitis due to pollen: Secondary | ICD-10-CM | POA: Diagnosis not present

## 2016-06-23 DIAGNOSIS — J3081 Allergic rhinitis due to animal (cat) (dog) hair and dander: Secondary | ICD-10-CM | POA: Diagnosis not present

## 2016-06-23 DIAGNOSIS — J3089 Other allergic rhinitis: Secondary | ICD-10-CM | POA: Diagnosis not present

## 2016-06-26 DIAGNOSIS — T63441D Toxic effect of venom of bees, accidental (unintentional), subsequent encounter: Secondary | ICD-10-CM | POA: Diagnosis not present

## 2016-06-29 DIAGNOSIS — J3089 Other allergic rhinitis: Secondary | ICD-10-CM | POA: Diagnosis not present

## 2016-06-29 DIAGNOSIS — J3081 Allergic rhinitis due to animal (cat) (dog) hair and dander: Secondary | ICD-10-CM | POA: Diagnosis not present

## 2016-06-29 DIAGNOSIS — J301 Allergic rhinitis due to pollen: Secondary | ICD-10-CM | POA: Diagnosis not present

## 2016-07-02 DIAGNOSIS — T63441D Toxic effect of venom of bees, accidental (unintentional), subsequent encounter: Secondary | ICD-10-CM | POA: Diagnosis not present

## 2016-07-06 DIAGNOSIS — J3081 Allergic rhinitis due to animal (cat) (dog) hair and dander: Secondary | ICD-10-CM | POA: Diagnosis not present

## 2016-07-06 DIAGNOSIS — J3089 Other allergic rhinitis: Secondary | ICD-10-CM | POA: Diagnosis not present

## 2016-07-06 DIAGNOSIS — J301 Allergic rhinitis due to pollen: Secondary | ICD-10-CM | POA: Diagnosis not present

## 2016-07-07 DIAGNOSIS — H35711 Central serous chorioretinopathy, right eye: Secondary | ICD-10-CM | POA: Diagnosis not present

## 2016-07-07 DIAGNOSIS — H44111 Panuveitis, right eye: Secondary | ICD-10-CM | POA: Diagnosis not present

## 2016-07-07 DIAGNOSIS — Z9841 Cataract extraction status, right eye: Secondary | ICD-10-CM | POA: Diagnosis not present

## 2016-07-07 DIAGNOSIS — H2512 Age-related nuclear cataract, left eye: Secondary | ICD-10-CM | POA: Diagnosis not present

## 2016-07-07 DIAGNOSIS — M452 Ankylosing spondylitis of cervical region: Secondary | ICD-10-CM | POA: Diagnosis not present

## 2016-07-13 DIAGNOSIS — T63441D Toxic effect of venom of bees, accidental (unintentional), subsequent encounter: Secondary | ICD-10-CM | POA: Diagnosis not present

## 2016-07-15 DIAGNOSIS — J3081 Allergic rhinitis due to animal (cat) (dog) hair and dander: Secondary | ICD-10-CM | POA: Diagnosis not present

## 2016-07-15 DIAGNOSIS — J301 Allergic rhinitis due to pollen: Secondary | ICD-10-CM | POA: Diagnosis not present

## 2016-07-15 DIAGNOSIS — J3089 Other allergic rhinitis: Secondary | ICD-10-CM | POA: Diagnosis not present

## 2016-07-17 DIAGNOSIS — T63461D Toxic effect of venom of wasps, accidental (unintentional), subsequent encounter: Secondary | ICD-10-CM | POA: Diagnosis not present

## 2016-07-17 DIAGNOSIS — T63441D Toxic effect of venom of bees, accidental (unintentional), subsequent encounter: Secondary | ICD-10-CM | POA: Diagnosis not present

## 2016-07-17 DIAGNOSIS — T63451D Toxic effect of venom of hornets, accidental (unintentional), subsequent encounter: Secondary | ICD-10-CM | POA: Diagnosis not present

## 2016-07-21 DIAGNOSIS — J3089 Other allergic rhinitis: Secondary | ICD-10-CM | POA: Diagnosis not present

## 2016-07-21 DIAGNOSIS — J3081 Allergic rhinitis due to animal (cat) (dog) hair and dander: Secondary | ICD-10-CM | POA: Diagnosis not present

## 2016-07-21 DIAGNOSIS — J301 Allergic rhinitis due to pollen: Secondary | ICD-10-CM | POA: Diagnosis not present

## 2016-07-23 DIAGNOSIS — T63441D Toxic effect of venom of bees, accidental (unintentional), subsequent encounter: Secondary | ICD-10-CM | POA: Diagnosis not present

## 2016-07-23 DIAGNOSIS — T63461D Toxic effect of venom of wasps, accidental (unintentional), subsequent encounter: Secondary | ICD-10-CM | POA: Diagnosis not present

## 2016-07-23 DIAGNOSIS — T63451D Toxic effect of venom of hornets, accidental (unintentional), subsequent encounter: Secondary | ICD-10-CM | POA: Diagnosis not present

## 2016-07-27 DIAGNOSIS — J301 Allergic rhinitis due to pollen: Secondary | ICD-10-CM | POA: Diagnosis not present

## 2016-07-27 DIAGNOSIS — J3081 Allergic rhinitis due to animal (cat) (dog) hair and dander: Secondary | ICD-10-CM | POA: Diagnosis not present

## 2016-07-27 DIAGNOSIS — J3089 Other allergic rhinitis: Secondary | ICD-10-CM | POA: Diagnosis not present

## 2016-07-30 DIAGNOSIS — T63441D Toxic effect of venom of bees, accidental (unintentional), subsequent encounter: Secondary | ICD-10-CM | POA: Diagnosis not present

## 2016-07-30 DIAGNOSIS — T63461D Toxic effect of venom of wasps, accidental (unintentional), subsequent encounter: Secondary | ICD-10-CM | POA: Diagnosis not present

## 2016-07-30 DIAGNOSIS — T63451D Toxic effect of venom of hornets, accidental (unintentional), subsequent encounter: Secondary | ICD-10-CM | POA: Diagnosis not present

## 2016-08-03 DIAGNOSIS — J3081 Allergic rhinitis due to animal (cat) (dog) hair and dander: Secondary | ICD-10-CM | POA: Diagnosis not present

## 2016-08-03 DIAGNOSIS — J3089 Other allergic rhinitis: Secondary | ICD-10-CM | POA: Diagnosis not present

## 2016-08-03 DIAGNOSIS — J301 Allergic rhinitis due to pollen: Secondary | ICD-10-CM | POA: Diagnosis not present

## 2016-08-06 DIAGNOSIS — T63461D Toxic effect of venom of wasps, accidental (unintentional), subsequent encounter: Secondary | ICD-10-CM | POA: Diagnosis not present

## 2016-08-06 DIAGNOSIS — T63441D Toxic effect of venom of bees, accidental (unintentional), subsequent encounter: Secondary | ICD-10-CM | POA: Diagnosis not present

## 2016-08-06 DIAGNOSIS — T63451D Toxic effect of venom of hornets, accidental (unintentional), subsequent encounter: Secondary | ICD-10-CM | POA: Diagnosis not present

## 2016-08-10 DIAGNOSIS — J3089 Other allergic rhinitis: Secondary | ICD-10-CM | POA: Diagnosis not present

## 2016-08-10 DIAGNOSIS — J301 Allergic rhinitis due to pollen: Secondary | ICD-10-CM | POA: Diagnosis not present

## 2016-08-10 DIAGNOSIS — J3081 Allergic rhinitis due to animal (cat) (dog) hair and dander: Secondary | ICD-10-CM | POA: Diagnosis not present

## 2016-08-17 DIAGNOSIS — J301 Allergic rhinitis due to pollen: Secondary | ICD-10-CM | POA: Diagnosis not present

## 2016-08-17 DIAGNOSIS — J3089 Other allergic rhinitis: Secondary | ICD-10-CM | POA: Diagnosis not present

## 2016-08-17 DIAGNOSIS — E119 Type 2 diabetes mellitus without complications: Secondary | ICD-10-CM | POA: Diagnosis not present

## 2016-08-17 DIAGNOSIS — D649 Anemia, unspecified: Secondary | ICD-10-CM | POA: Diagnosis not present

## 2016-08-17 DIAGNOSIS — E7800 Pure hypercholesterolemia, unspecified: Secondary | ICD-10-CM | POA: Diagnosis not present

## 2016-08-17 DIAGNOSIS — R5383 Other fatigue: Secondary | ICD-10-CM | POA: Diagnosis not present

## 2016-08-17 DIAGNOSIS — J3081 Allergic rhinitis due to animal (cat) (dog) hair and dander: Secondary | ICD-10-CM | POA: Diagnosis not present

## 2016-08-18 DIAGNOSIS — H44111 Panuveitis, right eye: Secondary | ICD-10-CM | POA: Diagnosis not present

## 2016-08-18 DIAGNOSIS — H2512 Age-related nuclear cataract, left eye: Secondary | ICD-10-CM | POA: Diagnosis not present

## 2016-08-18 DIAGNOSIS — H35711 Central serous chorioretinopathy, right eye: Secondary | ICD-10-CM | POA: Diagnosis not present

## 2016-08-18 DIAGNOSIS — Z9841 Cataract extraction status, right eye: Secondary | ICD-10-CM | POA: Diagnosis not present

## 2016-08-18 DIAGNOSIS — Z961 Presence of intraocular lens: Secondary | ICD-10-CM | POA: Diagnosis not present

## 2016-08-18 DIAGNOSIS — M452 Ankylosing spondylitis of cervical region: Secondary | ICD-10-CM | POA: Diagnosis not present

## 2016-08-19 ENCOUNTER — Telehealth: Payer: Self-pay | Admitting: Cardiovascular Disease

## 2016-08-19 NOTE — Telephone Encounter (Signed)
Received records from [facility] for appointment on 08/25/16 with Dr Gwenlyn Found.  Records put with Dr Kennon Holter schedule for 08/25/16. lp

## 2016-08-24 DIAGNOSIS — J3081 Allergic rhinitis due to animal (cat) (dog) hair and dander: Secondary | ICD-10-CM | POA: Diagnosis not present

## 2016-08-24 DIAGNOSIS — J301 Allergic rhinitis due to pollen: Secondary | ICD-10-CM | POA: Diagnosis not present

## 2016-08-24 DIAGNOSIS — J3089 Other allergic rhinitis: Secondary | ICD-10-CM | POA: Diagnosis not present

## 2016-08-24 DIAGNOSIS — Z1212 Encounter for screening for malignant neoplasm of rectum: Secondary | ICD-10-CM | POA: Diagnosis not present

## 2016-08-25 ENCOUNTER — Ambulatory Visit: Payer: Medicare Other | Admitting: Cardiovascular Disease

## 2016-08-31 DIAGNOSIS — J3089 Other allergic rhinitis: Secondary | ICD-10-CM | POA: Diagnosis not present

## 2016-08-31 DIAGNOSIS — Z791 Long term (current) use of non-steroidal anti-inflammatories (NSAID): Secondary | ICD-10-CM | POA: Diagnosis not present

## 2016-08-31 DIAGNOSIS — J3081 Allergic rhinitis due to animal (cat) (dog) hair and dander: Secondary | ICD-10-CM | POA: Diagnosis not present

## 2016-08-31 DIAGNOSIS — M545 Low back pain: Secondary | ICD-10-CM | POA: Diagnosis not present

## 2016-08-31 DIAGNOSIS — M459 Ankylosing spondylitis of unspecified sites in spine: Secondary | ICD-10-CM | POA: Diagnosis not present

## 2016-08-31 DIAGNOSIS — J301 Allergic rhinitis due to pollen: Secondary | ICD-10-CM | POA: Diagnosis not present

## 2016-08-31 DIAGNOSIS — M25511 Pain in right shoulder: Secondary | ICD-10-CM | POA: Diagnosis not present

## 2016-09-03 DIAGNOSIS — D509 Iron deficiency anemia, unspecified: Secondary | ICD-10-CM | POA: Diagnosis not present

## 2016-09-03 DIAGNOSIS — R197 Diarrhea, unspecified: Secondary | ICD-10-CM | POA: Diagnosis not present

## 2016-09-08 DIAGNOSIS — J301 Allergic rhinitis due to pollen: Secondary | ICD-10-CM | POA: Diagnosis not present

## 2016-09-08 DIAGNOSIS — J3081 Allergic rhinitis due to animal (cat) (dog) hair and dander: Secondary | ICD-10-CM | POA: Diagnosis not present

## 2016-09-08 DIAGNOSIS — J3089 Other allergic rhinitis: Secondary | ICD-10-CM | POA: Diagnosis not present

## 2016-09-10 ENCOUNTER — Other Ambulatory Visit: Payer: Self-pay | Admitting: Internal Medicine

## 2016-09-10 ENCOUNTER — Ambulatory Visit
Admission: RE | Admit: 2016-09-10 | Discharge: 2016-09-10 | Disposition: A | Discharge status: Home / Self Care | Payer: Medicare Other | Source: Ambulatory Visit | Attending: Internal Medicine | Admitting: Internal Medicine

## 2016-09-10 DIAGNOSIS — D649 Anemia, unspecified: Secondary | ICD-10-CM | POA: Diagnosis not present

## 2016-09-10 DIAGNOSIS — D509 Iron deficiency anemia, unspecified: Secondary | ICD-10-CM

## 2016-09-10 DIAGNOSIS — R109 Unspecified abdominal pain: Secondary | ICD-10-CM

## 2016-09-10 DIAGNOSIS — R197 Diarrhea, unspecified: Secondary | ICD-10-CM | POA: Diagnosis not present

## 2016-09-10 DIAGNOSIS — R634 Abnormal weight loss: Secondary | ICD-10-CM

## 2016-09-10 DIAGNOSIS — R894 Abnormal immunological findings in specimens from other organs, systems and tissues: Secondary | ICD-10-CM | POA: Diagnosis not present

## 2016-09-10 MED ORDER — IOPAMIDOL (ISOVUE-300) INJECTION 61%
100.0000 mL | Freq: Once | INTRAVENOUS | Status: AC | PRN
  Administered 2016-09-10: 100 mL via INTRAVENOUS

## 2016-09-11 ENCOUNTER — Ambulatory Visit (INDEPENDENT_AMBULATORY_CARE_PROVIDER_SITE_OTHER): Payer: Medicare Other | Admitting: Cardiovascular Disease

## 2016-09-11 ENCOUNTER — Encounter: Payer: Self-pay | Admitting: Cardiovascular Disease

## 2016-09-11 VITALS — BP 122/56 | HR 69 | Ht 66.0 in | Wt 135.0 lb

## 2016-09-11 DIAGNOSIS — E785 Hyperlipidemia, unspecified: Secondary | ICD-10-CM | POA: Diagnosis not present

## 2016-09-11 DIAGNOSIS — R0609 Other forms of dyspnea: Secondary | ICD-10-CM | POA: Diagnosis not present

## 2016-09-11 NOTE — Assessment & Plan Note (Signed)
New-onset dyspnea on exertion over the last 6 months. He's noticed increasing shortness of breath on routine daily activities. His only cardiac risk factors are diabetes and hypertriglyceridemia. He denies chest pain. EKG shows right bundle branch block. I'm going to order routine GXT and a 2-D echo cardiogram to further evaluate.

## 2016-09-11 NOTE — Patient Instructions (Signed)
Medication Instructions: Your physician recommends that you continue on your current medications as directed. Please refer to the Current Medication list given to you today.  If you need a refill on your cardiac medications before your next appointment, please call your pharmacy.  Testing/Procedures: Your physician has requested that you have an exercise tolerance test. For further information please visit [email or web address]. Please also follow instruction sheet, as given.  Your physician has requested that you have an echocardiogram. Echocardiography is a painless test that uses sound waves to create images of your heart. It provides your doctor with information about the size and shape of your heart and how well your heart's chambers and valves are working. This procedure takes approximately one hour. There are no restrictions for this procedure.  Follow-Up: Your physician recommends that you schedule a follow-up appointment as needed with Dr. Gwenlyn Found.   Any Other Special Instructions will be listed below:   Exercise Stress Electrocardiogram An exercise stress electrocardiogram is a test to check how blood flows to your heart. It is done to find areas of poor blood flow. You will need to walk on a treadmill for this test. The electrocardiogram will record your heartbeat when you are at rest and when you are exercising. What happens before the procedure?  Do not have drinks with caffeine or foods with caffeine for 24 hours before the test, or as told by your doctor. This includes coffee, tea (even decaf tea), sodas, chocolate, and cocoa.  Follow your doctor's instructions about eating and drinking before the test.  Ask your doctor what medicines you should or should not take before the test. Take your medicines with water unless told by your doctor not to.  If you use an inhaler, bring it with you to the test.  Bring a snack to eat after the test.  Do not  smoke for 4 hours before the  test.  Do not put lotions, powders, creams, or oils on your chest before the test.  Wear comfortable shoes and clothing. What happens during the procedure?  You will have patches put on your chest. Small areas of your chest may need to be shaved. Wires will be connected to the patches.  Your heart rate will be watched while you are resting and while you are exercising.  You will walk on the treadmill. The treadmill will slowly get faster to raise your heart rate.  The test will take about 1-2 hours. What happens after the procedure?  Your heart rate and blood pressure will be watched after the test.  You may return to your normal diet, activities, and medicines or as told by your doctor. This information is not intended to replace advice given to you by your health care provider. Make sure you discuss any questions you have with your health care provider. Document Released: 09/16/2007 Document Revised: 11/27/2015 Document Reviewed: 12/05/2012 Elsevier Interactive Patient Education  [employer].

## 2016-09-11 NOTE — Progress Notes (Signed)
09/11/2016 Steven [name]   Oct 10, 1934  [id number]  Primary Physician Thressa Sheller, MD Primary Cardiologist: Lorretta Harp MD Renae Gloss  HPI:  Steven [name] is a very pleasant 82 year old thin-appearing married Caucasian male father of 2 children and a grandfather and 3 grandchildren referred by Dr. Shelia Media for cardiovascular evaluation because of newly recognized dyspnea on exertion. He is retired from working as an Psychologist, clinical for [employer]. He smoked remotely and drink 3 beers a day. His only other risk factors are diabetes and hypertriglyceridemia. There is no family history. He has never had a heart attack or stroke. He's had 3-4 intestinal blockages in the past, one of which required surgery. He is noticing increasing dyspnea on exertion with normal activity over the last 6 months but denies chest pain.   Current Outpatient Prescriptions  Medication Sig Dispense Refill  . Biotin 1 MG CAPS Take 1 capsule by mouth daily.    . Calcium Carbonate-Vitamin D (CALCIUM 600+D) 600-400 MG-UNIT per tablet Take 1 tablet by mouth 2 (two) times daily.    . cholecalciferol (VITAMIN D) 400 UNITS TABS tablet Take 1,000 Units by mouth daily.     . citalopram (CELEXA) 40 MG tablet Take 40 mg by mouth daily.    . fenofibrate (TRICOR) 145 MG tablet Take 145 mg by mouth every evening.     . folic acid-pyridoxine-cyancobalamin (FOLBIC) 2.5-25-2 MG TABS Take 1 tablet by mouth every evening.     . meloxicam (MOBIC) 7.5 MG tablet Take 7.5 mg by mouth 2 (two) times daily.    . Multiple Vitamins-Minerals (PRESERVISION AREDS 2 PO) Take 1 tablet by mouth daily.      No current facility-administered medications for this visit.     Allergies  Allergen Reactions  . Indomethacin Hives    Social History   Social History  . Marital status: Married    Spouse name: N/A  . Number of children: N/A  . Years of education: N/A   Occupational History  . Not on file.   Social History  Main Topics  . Smoking status: Former Research scientist (life sciences)  . Smokeless tobacco: Never Used  . Alcohol use Yes     Comment: 3 beers or glasses of wine a day   . Drug use: No  . Sexual activity: Not on file   Other Topics Concern  . Not on file   Social History Narrative  . No narrative on file     Review of Systems: General: negative for chills, fever, night sweats or weight changes.  Cardiovascular: negative for chest pain, dyspnea on exertion, edema, orthopnea, palpitations, paroxysmal nocturnal dyspnea or shortness of breath Dermatological: negative for rash Respiratory: negative for cough or wheezing Urologic: negative for hematuria Abdominal: negative for nausea, vomiting, diarrhea, bright red blood per rectum, melena, or hematemesis Neurologic: negative for visual changes, syncope, or dizziness All other systems reviewed and are otherwise negative except as noted above.    Blood pressure (!) 122/56, pulse 69, height 5\' 6"  (1.676 m), weight 135 lb (61.2 kg), SpO2 97 %.  General appearance: alert and no distress Neck: no adenopathy, no carotid bruit, no JVD, supple, symmetrical, trachea midline and thyroid not enlarged, symmetric, no tenderness/mass/nodules Lungs: clear to auscultation bilaterally Heart: regular rate and rhythm, S1, S2 normal, no murmur, click, rub or gallop Extremities: extremities normal, atraumatic, no cyanosis or edema  EKG not performed today. EKG performed by his PCP 08/17/16 revealed sinus rhythm at  61 with right bundle branch block. I personally reviewed that EKG.  ASSESSMENT AND PLAN:   Hyperlipidemia History of hypercholesterolemia on TriCor followed by his PCP  Dyspnea on exertion New-onset dyspnea on exertion over the last 6 months. He's noticed increasing shortness of breath on routine daily activities. His only cardiac risk factors are diabetes and hypertriglyceridemia. He denies chest pain. EKG shows right bundle branch block. I'm going to order routine  GXT and a 2-D echo cardiogram to further evaluate.      Lorretta Harp MD FACP,FACC,FAHA, [facility] 09/11/2016 2:35 PM

## 2016-09-11 NOTE — Assessment & Plan Note (Signed)
History of hypercholesterolemia on TriCor followed by his PCP

## 2016-09-14 DIAGNOSIS — M452 Ankylosing spondylitis of cervical region: Secondary | ICD-10-CM | POA: Diagnosis not present

## 2016-09-14 DIAGNOSIS — Z961 Presence of intraocular lens: Secondary | ICD-10-CM | POA: Diagnosis not present

## 2016-09-14 DIAGNOSIS — H2512 Age-related nuclear cataract, left eye: Secondary | ICD-10-CM | POA: Diagnosis not present

## 2016-09-16 ENCOUNTER — Encounter: Payer: Self-pay | Admitting: Physician Assistant

## 2016-09-16 ENCOUNTER — Ambulatory Visit (INDEPENDENT_AMBULATORY_CARE_PROVIDER_SITE_OTHER): Payer: Medicare Other | Admitting: Physician Assistant

## 2016-09-16 VITALS — BP 110/58 | HR 62 | Ht 66.0 in | Wt 135.0 lb

## 2016-09-16 DIAGNOSIS — R197 Diarrhea, unspecified: Secondary | ICD-10-CM | POA: Diagnosis not present

## 2016-09-16 DIAGNOSIS — D649 Anemia, unspecified: Secondary | ICD-10-CM | POA: Diagnosis not present

## 2016-09-16 DIAGNOSIS — R1084 Generalized abdominal pain: Secondary | ICD-10-CM | POA: Diagnosis not present

## 2016-09-16 DIAGNOSIS — R768 Other specified abnormal immunological findings in serum: Secondary | ICD-10-CM

## 2016-09-16 NOTE — Progress Notes (Signed)
Chief Complaint: Abdominal pain,diarrhea, anemia  HPI:  Steven [name] is a 82 year old Caucasian male with a past medical history of anemia, anxiety, depression, diabetes and others listed below, who was referred to me by Thressa Sheller, MD for a complaint of abdominal pain,diarrhea and anemia .      Patient was previously seen in our clinic by Dr. Henrene Pastor on 11/17/13 for colonoscopy this showed moderate diverticulosis of the colon and was otherwise normal. He was told to return as needed.    Per review of the referring physician's notes the patient was last seen in the clinic 09/10/16 and at that time described no improvement of symptoms and still having diarrhea. His celiac panel returned abnormal and he had worsening anemia. His appetite had been low and he described having abdominal pain all over. He did have history of large intestinal abscess requiring a colostomy. At that time a CT abdomen and pelvis was ordered and patient was referred to our office.   Per review of recent labs patient has actually had an increase in his hemoglobin, 09/03/16 9.3 and on 09/10/16  9.9, MCV normal, CBC otherwise normal. Celiac panel was completed showing Gliadin antibody, IgG increased at 40, panel was otherwise negative/normal. Patient had occult blood stool studies ordered on 08/24/2016 which were negative. CMP was normal 08/17/16. TSH normal 08/18/78. Iron studies completed 08/17/16 show a ferritin low at 18, iron low at 37 and percent saturation low at 8.   CT abdomen and pelvis with contrast completed 09/11/2016 shows no acute inflammatory process identified, prior bowel surgery with no obstruction or adverse features, calcified aortic atherosclerosis, nephrolithiasis and ankylosing spondylitis.   Today, the patient is a poor historian and it is hard to garner exact details from him. He tells me that for the past few weeks he has been having symptoms of lower abdominal pain as well as loose stool. Patient describes that his  abdominal pain would "come and go", but it has been gone now over the past week since he has been on a wheat free diet. Patient tells me that before his diarrhea was more of a loose stool and he would have 2-3 bowel movements a day which seemed to "get looser as they progressed". This had been occurring for the past 2 months. Patient tells me that since being on a wheat free diet he had not had any diarrhea and in fact had formed stools until this morning, when he did have one episode of diarrhea. He does tell me he had beer last night but this is not abnormal for him and this was wheat free. Patient tells me again that his abdominal pain is gone and in general he feels as though his "wheat free" diet has been helping him. Patient does describe that many years ago he had similar symptoms with loose stools but this went away on its own.     In regards to his anemia, the patient tells me he has a history of anemia which has been related to his other chronic diseases including ankylosing spondylitis and others, he has been on iron now for the past week and tells me that his hemoglobin actually improved. Overall the patient is feeling better and thinking that "we are on the right track with this gluten-free thing".   Patient denies fever, chills, blood in his stool, melena, weight loss, fatigue, anorexia, nausea, vomiting, heartburn, reflux or symptoms that awaken him at night.  Past Medical History:  Diagnosis Date  .  Allergy    enviromental  . Anemia 2017  . Anxiety   . Arthritis    ankylosing spodilitis  . Blood transfusion without reported diagnosis    during surgery  . Cataract   . Depression   . Diabetes ([place]) 2017   Manages with diet  . Hyperlipidemia   . Kidney calculi   . Mental disorder     Past Surgical History:  Procedure Laterality Date  . ABDOMINAL SURGERY     had abcess  . APPENDECTOMY    . CHOLECYSTECTOMY    . COLONOSCOPY    . COLOSTOMY    . COLOSTOMY REVERSAL    . JOINT  REPLACEMENT     hip  . SHOULDER ARTHROSCOPY WITH ROTATOR CUFF REPAIR AND SUBACROMIAL DECOMPRESSION Left 03/02/2013   Procedure: LEFT SHOULDER ARTHROSCOPY WITH SUBACROMIAL DECOMPRESSION DISTAL CALVICLE RESECTION AND ROTATOR CUFF REPAIR ;  Surgeon: [name], MD;  Location: [facility];  Service: Orthopedics;  Laterality: Left;    Current Outpatient Prescriptions  Medication Sig Dispense Refill  . Biotin 1 MG CAPS Take 1 capsule by mouth daily.    . Calcium Carbonate-Vitamin D (CALCIUM 600+D) 600-400 MG-UNIT per tablet Take 1 tablet by mouth 2 (two) times daily.    . cholecalciferol (VITAMIN D) 400 UNITS TABS tablet Take 1,000 Units by mouth daily.     . citalopram (CELEXA) 40 MG tablet Take 40 mg by mouth daily.    . fenofibrate (TRICOR) 145 MG tablet Take 145 mg by mouth every evening.     . folic acid (FOLVITE) 1 MG tablet Take 1 mg by mouth daily. Takes 5mg  qd    . folic acid-pyridoxine-cyancobalamin (FOLBIC) 2.5-25-2 MG TABS Take 1 tablet by mouth every evening.     . Iron-FA-B Cmp-C-Biot-Probiotic (FUSION PLUS PO) Take by mouth 1 day or 1 dose.    . meloxicam (MOBIC) 7.5 MG tablet Take 7.5 mg by mouth 2 (two) times daily.    . methotrexate (RHEUMATREX) 5 MG tablet Take 5 mg by mouth once a week. Caution: Chemotherapy. Protect from light. Takes 6 tabs qweek    . Multiple Vitamins-Minerals (PRESERVISION AREDS 2 PO) Take 1 tablet by mouth daily.      No current facility-administered medications for this visit.     Allergies as of 09/16/2016 - Review Complete 09/16/2016  Allergen Reaction Noted  . Indomethacin Hives 11/14/2012    Family History  Problem Relation Age of Onset  . Heart attack Mother   . Cancer Mother   . Diabetes Mother   . Heart attack Maternal Grandfather     Social History   Social History  . Marital status: Married    Spouse name: N/A  . Number of children: N/A  . Years of education: N/A   Occupational History  . Not on file.   Social History Main  Topics  . Smoking status: Former Research scientist (life sciences)  . Smokeless tobacco: Never Used  . Alcohol use Yes     Comment: 3 beers or glasses of wine a day   . Drug use: No  . Sexual activity: Not on file   Other Topics Concern  . Not on file   Social History Narrative  . No narrative on file    Review of Systems:    Constitutional: No weight loss, fever or chills Skin: No rash  Cardiovascular: No chest pain Respiratory: No SOB  Gastrointestinal: See HPI and otherwise negative Genitourinary: No dysuria  Neurological: No headache Musculoskeletal: No new muscle  or joint pain Hematologic: No bleeding Psychiatric: No history of depression or anxiety   Physical Exam:  Vital signs: BP (!) 110/58   Pulse 62   Ht 5\' 6"  (1.676 m)   Wt 135 lb (61.2 kg)   BMI 21.79 kg/m   Constitutional:   Pleasant elderly Caucasian male appears to be in NAD, Well developed, Well nourished, alert and cooperative Head:  Normocephalic and atraumatic. Eyes:   PEERL, EOMI. No icterus. Conjunctiva pink. Ears:  Normal auditory acuity. Neck:  Supple Throat: Oral cavity and pharynx without inflammation, swelling or lesion.  Respiratory: Respirations even and unlabored. Lungs clear to auscultation bilaterally.   No wheezes, crackles, or rhonchi.  Cardiovascular: Normal S1, S2. No MRG. Regular rate and rhythm. No peripheral edema, cyanosis or pallor.  Gastrointestinal:  Soft, nondistended, nontender. No rebound or guarding. Normal bowel sounds. No appreciable masses or hepatomegaly. Rectal:  Not performed.  Msk:  Symmetrical without gross deformities. Without edema, no deformity or joint abnormality.  Neurologic:  Alert and  oriented x4;  grossly normal neurologically.  Skin:   Dry and intact without significant lesions or rashes. Psychiatric:  Demonstrates good judgement and reason without abnormal affect or behaviors.  See HPI for recent labs.  Imaging:  EXAM: CT ABDOMEN AND PELVIS WITH CONTRAST  09/10/16  TECHNIQUE: Multidetector CT imaging of the abdomen and pelvis was performed using the standard protocol following bolus administration of intravenous contrast.  CONTRAST:  139mL ISOVUE-300 IOPAMIDOL (ISOVUE-300) INJECTION 61%  COMPARISON:  CT Abdomen and Pelvis 03/21/2015. Abdominal radiographs 09/10/2016  FINDINGS: Lower chest: Negative.  No pleural or pericardial effusion.  Hepatobiliary: Surgically absent gallbladder.  Negative liver.  Pancreas: Negative.  Spleen: Negative.  Adrenals/Urinary Tract: Normal adrenal glands. Chronic bilateral nephrolithiasis appears stable since 2016 with individual renal calculi up to 7 mm. Occasional small benign renal cysts. There are several benign renal parapelvic cysts bilaterally. Renal enhancement and contrast excretion is otherwise normal. No hydroureter. No perinephric or periureteral stranding.  Diminutive and unremarkable  Stomach/Bowel: Oral contrast has reached the rectum without obstruction. Redundant sigmoid colon in the pelvis. Suggestion of an anastomosis between the descending and sigmoid colon in the left lower quadrant. Occasional diverticula in the region. No active inflammation. Mild diverticulosis at the splenic flexure. Hepatic flexure and right colon are within normal limits. A narrowed segment of the hepatic flexure as seen on coronal image 34 is felt to be due to physiologic bowel peristalsis. Negative terminal ileum. Appendix is diminutive or absent. There is a large chronic small bowel anastomosis in the left mid abdomen which appears stable since 2016 and without adverse features. Otherwise no dilated or abnormal small bowel loops identified. The stomach and duodenum are decompressed.  No abdominal free fluid.  Vascular/Lymphatic: Aortoiliac calcified atherosclerosis. Major arterial structures in the abdomen and pelvis are patent. Mild chronic infrarenal abdominal aortic ectasia up to  27 mm diameter (series 2, image 30). Portal venous system is patent.  No lymphadenopathy.  Reproductive: Negative; evidence of prior vasectomy.  Other: No pelvic free fluid.  Musculoskeletal: Bilateral SI joint and diffuse spinal ankylosis. Chronic right hip arthroplasty. No acute osseous abnormality identified.  IMPRESSION: 1. No acute or inflammatory process identified in the abdomen or pelvis. 2. Prior bowel surgery with no obstruction or adverse features identified. 3. Calcified aortic atherosclerosis. Mildly ectatic abdominal aorta at risk for aneurysm development. Recommend followup by ultrasound in 5 years. This recommendation follows ACR consensus guidelines: White Paper of the ACR Incidental Findings Committee II  on Vascular Findings. J Am Coll Radiol 2013; 10:789-794. 4. Nephrolithiasis. 5. Ankylosing spondylitis.   Electronically Signed   By: Genevie Ann M.D.   On: 09/11/2016 09:27  Assessment: 1. Abdominal pain: Patient reports that this is completely gone over the past week since being on a "wheat free" diet, CT normal, CMP and CBC normal; question celiac disease versus IBS versus other 2. Diarrhea: Improved with wheat free diet 3. Positive Gliadan antibody IgG: Indicating possible celiac disease, patient has been on gluten free diet and feeling better 4. Anemia: Chronic for the patient, evaluation as far back as 1995, most recent colonoscopy 11/17/13, normal, patient improving oral iron supplementation; question relation to recently diagnosed celiac disease versus other  Plan: 1. Discussed with the patient that because his symptoms seem to be improving on a gluten-free diet and his anemia is improving on oral iron supplementation over the past week would recommend that he continue with these measures. Due to his advanced age he would be at increased risk for endoscopic procedures, so we will try to avoid these if possible. 2. If patient is not improved in the  next 4-6 weeks when he comes in for his follow-up with Dr. Henrene Pastor, further evaluation can be discussed/further discussion of his possible celiac disease. 3. Patient to follow in clinic in 4-6 weeks with Dr. Josefine Class, PA-C [facility] 09/16/2016, 10:58 AM  Cc: Thressa Sheller, MD

## 2016-09-16 NOTE — Progress Notes (Signed)
Reviewed. The nature of his problems are not entirely clear. Only seen on 1 occasion in the past for a procedure. Agree with initiating conservative measures and follow-up. May need to see hematology for anemia

## 2016-09-16 NOTE — Patient Instructions (Signed)
Please keep your follow up with Dr. Henrene Pastor.

## 2016-09-17 ENCOUNTER — Other Ambulatory Visit: Payer: Self-pay | Admitting: Family Medicine

## 2016-09-17 ENCOUNTER — Inpatient Hospital Stay: Admission: RE | Admit: 2016-09-17 | Payer: Self-pay | Source: Ambulatory Visit

## 2016-09-17 ENCOUNTER — Ambulatory Visit
Admission: RE | Admit: 2016-09-17 | Discharge: 2016-09-17 | Disposition: A | Discharge status: Home / Self Care | Payer: Medicare Other | Source: Ambulatory Visit | Attending: Family Medicine | Admitting: Family Medicine

## 2016-09-17 DIAGNOSIS — J301 Allergic rhinitis due to pollen: Secondary | ICD-10-CM | POA: Diagnosis not present

## 2016-09-17 DIAGNOSIS — T84020A Dislocation of internal right hip prosthesis, initial encounter: Secondary | ICD-10-CM

## 2016-09-17 DIAGNOSIS — M25511 Pain in right shoulder: Secondary | ICD-10-CM | POA: Diagnosis not present

## 2016-09-17 DIAGNOSIS — J3089 Other allergic rhinitis: Secondary | ICD-10-CM | POA: Diagnosis not present

## 2016-09-17 DIAGNOSIS — J3081 Allergic rhinitis due to animal (cat) (dog) hair and dander: Secondary | ICD-10-CM | POA: Diagnosis not present

## 2016-09-17 DIAGNOSIS — M25551 Pain in right hip: Secondary | ICD-10-CM | POA: Diagnosis not present

## 2016-09-18 ENCOUNTER — Telehealth: Payer: Self-pay | Admitting: Physician Assistant

## 2016-09-18 DIAGNOSIS — T84020D Dislocation of internal right hip prosthesis, subsequent encounter: Secondary | ICD-10-CM | POA: Diagnosis not present

## 2016-09-18 DIAGNOSIS — M25551 Pain in right hip: Secondary | ICD-10-CM | POA: Diagnosis not present

## 2016-09-18 NOTE — Telephone Encounter (Signed)
Pt was seen by Anderson Malta on 09/16/16 and was advised to follow up with Dr Henrene Pastor if no better.  Anderson Malta is off today.  Dr Henrene Pastor is in the office all day today.  Vaughan Basta have you gotten any messages on this pt?

## 2016-09-18 NOTE — Telephone Encounter (Signed)
Pt came into the office, just saw Steven Newer PA 09/16/16. Pt states he has been following a gluten diet and states he started having some abdominal discomfort last evening and some bloating. Pt states he is not constipated and wants to know what he can take for the constipation. Discussed with pt that he can take miralax for the constipation. Pt and wife verbalized understanding.

## 2016-09-21 DIAGNOSIS — J301 Allergic rhinitis due to pollen: Secondary | ICD-10-CM | POA: Diagnosis not present

## 2016-09-21 DIAGNOSIS — J3089 Other allergic rhinitis: Secondary | ICD-10-CM | POA: Diagnosis not present

## 2016-09-22 ENCOUNTER — Telehealth (HOSPITAL_COMMUNITY): Payer: Self-pay | Admitting: Cardiovascular Disease

## 2016-09-22 ENCOUNTER — Other Ambulatory Visit: Payer: Self-pay | Admitting: Orthopedic Surgery

## 2016-09-23 ENCOUNTER — Inpatient Hospital Stay (HOSPITAL_COMMUNITY): Admission: RE | Admit: 2016-09-23 | Payer: Medicare Other | Source: Ambulatory Visit

## 2016-09-24 NOTE — Telephone Encounter (Signed)
Close encounter 

## 2016-09-25 DIAGNOSIS — T63441D Toxic effect of venom of bees, accidental (unintentional), subsequent encounter: Secondary | ICD-10-CM | POA: Diagnosis not present

## 2016-09-25 DIAGNOSIS — T63451D Toxic effect of venom of hornets, accidental (unintentional), subsequent encounter: Secondary | ICD-10-CM | POA: Diagnosis not present

## 2016-09-25 DIAGNOSIS — T63461D Toxic effect of venom of wasps, accidental (unintentional), subsequent encounter: Secondary | ICD-10-CM | POA: Diagnosis not present

## 2016-09-29 ENCOUNTER — Other Ambulatory Visit: Payer: Self-pay

## 2016-09-29 ENCOUNTER — Ambulatory Visit (HOSPITAL_COMMUNITY): Payer: Medicare Other | Attending: Cardiovascular Disease

## 2016-09-29 DIAGNOSIS — H44111 Panuveitis, right eye: Secondary | ICD-10-CM | POA: Diagnosis not present

## 2016-09-29 DIAGNOSIS — I420 Dilated cardiomyopathy: Secondary | ICD-10-CM | POA: Insufficient documentation

## 2016-09-29 DIAGNOSIS — I5030 Unspecified diastolic (congestive) heart failure: Secondary | ICD-10-CM | POA: Diagnosis not present

## 2016-09-29 DIAGNOSIS — M452 Ankylosing spondylitis of cervical region: Secondary | ICD-10-CM | POA: Diagnosis not present

## 2016-09-29 DIAGNOSIS — I080 Rheumatic disorders of both mitral and aortic valves: Secondary | ICD-10-CM | POA: Diagnosis not present

## 2016-09-29 DIAGNOSIS — J3081 Allergic rhinitis due to animal (cat) (dog) hair and dander: Secondary | ICD-10-CM | POA: Diagnosis not present

## 2016-09-29 DIAGNOSIS — J301 Allergic rhinitis due to pollen: Secondary | ICD-10-CM | POA: Diagnosis not present

## 2016-09-29 DIAGNOSIS — H35711 Central serous chorioretinopathy, right eye: Secondary | ICD-10-CM | POA: Diagnosis not present

## 2016-09-29 DIAGNOSIS — R0609 Other forms of dyspnea: Secondary | ICD-10-CM

## 2016-09-29 DIAGNOSIS — H2512 Age-related nuclear cataract, left eye: Secondary | ICD-10-CM | POA: Diagnosis not present

## 2016-09-29 DIAGNOSIS — Z961 Presence of intraocular lens: Secondary | ICD-10-CM | POA: Diagnosis not present

## 2016-09-29 DIAGNOSIS — J3089 Other allergic rhinitis: Secondary | ICD-10-CM | POA: Diagnosis not present

## 2016-09-30 ENCOUNTER — Ambulatory Visit (HOSPITAL_COMMUNITY)
Admission: RE | Admit: 2016-09-30 | Discharge: 2016-09-30 | Disposition: A | Discharge status: Home / Self Care | Payer: Medicare Other | Source: Ambulatory Visit | Attending: Orthopedic Surgery | Admitting: Orthopedic Surgery

## 2016-09-30 ENCOUNTER — Encounter (HOSPITAL_COMMUNITY): Payer: Self-pay

## 2016-09-30 ENCOUNTER — Other Ambulatory Visit (HOSPITAL_COMMUNITY): Payer: Self-pay | Admitting: *Deleted

## 2016-09-30 ENCOUNTER — Encounter (HOSPITAL_COMMUNITY)
Admission: RE | Admit: 2016-09-30 | Discharge: 2016-09-30 | Disposition: A | Discharge status: Home / Self Care | Payer: Medicare Other | Source: Ambulatory Visit | Attending: Orthopedic Surgery | Admitting: Orthopedic Surgery

## 2016-09-30 DIAGNOSIS — Z01818 Encounter for other preprocedural examination: Secondary | ICD-10-CM | POA: Diagnosis not present

## 2016-09-30 DIAGNOSIS — Z01812 Encounter for preprocedural laboratory examination: Secondary | ICD-10-CM | POA: Diagnosis not present

## 2016-09-30 DIAGNOSIS — I517 Cardiomegaly: Secondary | ICD-10-CM | POA: Diagnosis not present

## 2016-09-30 DIAGNOSIS — Z0183 Encounter for blood typing: Secondary | ICD-10-CM | POA: Insufficient documentation

## 2016-09-30 DIAGNOSIS — J9811 Atelectasis: Secondary | ICD-10-CM | POA: Diagnosis not present

## 2016-09-30 DIAGNOSIS — M419 Scoliosis, unspecified: Secondary | ICD-10-CM | POA: Diagnosis not present

## 2016-09-30 HISTORY — DX: Unspecified intestinal obstruction, unspecified as to partial versus complete obstruction: K56.609

## 2016-09-30 HISTORY — DX: Pneumonia, unspecified organism: J18.9

## 2016-09-30 HISTORY — DX: Dyspnea, unspecified: R06.00

## 2016-09-30 HISTORY — DX: Personal history of urinary calculi: Z87.442

## 2016-09-30 LAB — BASIC METABOLIC PANEL
Anion gap: 6 (ref 5–15)
BUN: 13 mg/dL (ref 6–20)
CALCIUM: 9 mg/dL (ref 8.9–10.3)
CO2: 28 mmol/L (ref 22–32)
CREATININE: 0.74 mg/dL (ref 0.61–1.24)
Chloride: 102 mmol/L (ref 101–111)
GFR calc non Af Amer: >60 mL/min (ref >60)
Glucose, Bld: 78 mg/dL (ref 65–99)
Potassium: 4.1 mmol/L (ref 3.5–5.1)
SODIUM: 136 mmol/L (ref 135–145)

## 2016-09-30 LAB — CBC WITH DIFFERENTIAL/PLATELET
BASOS PCT: 1 %
Basophils Absolute: 0 10*3/uL (ref 0.0–0.1)
EOS ABS: 0.3 10*3/uL (ref 0.0–0.7)
Eosinophils Relative: 4 %
HCT: 34.9 % — ABNORMAL LOW (ref 39.0–52.0)
HEMOGLOBIN: 10.7 g/dL — AB (ref 13.0–17.0)
Lymphocytes Relative: 19 %
Lymphs Abs: 1.3 10*3/uL (ref 0.7–4.0)
MCH: 25.8 pg — ABNORMAL LOW (ref 26.0–34.0)
MCHC: 30.7 g/dL (ref 30.0–36.0)
MCV: 84.1 fL (ref 78.0–100.0)
MONOS PCT: 8 %
Monocytes Absolute: 0.5 10*3/uL (ref 0.1–1.0)
NEUTROS PCT: 68 %
Neutro Abs: 4.5 10*3/uL (ref 1.7–7.7)
PLATELETS: 339 10*3/uL (ref 150–400)
RBC: 4.15 MIL/uL — ABNORMAL LOW (ref 4.22–5.81)
RDW: 20.4 % — ABNORMAL HIGH (ref 11.5–15.5)
WBC: 6.6 10*3/uL (ref 4.0–10.5)

## 2016-09-30 LAB — URINALYSIS, ROUTINE W REFLEX MICROSCOPIC
Bilirubin Urine: NEGATIVE
GLUCOSE, UA: NEGATIVE mg/dL
HGB URINE DIPSTICK: NEGATIVE
KETONES UR: 5 mg/dL — AB
NITRITE: NEGATIVE
PROTEIN: NEGATIVE mg/dL
Specific Gravity, Urine: 1.019 (ref 1.005–1.030)
Squamous Epithelial / LPF: NONE SEEN
pH: 5 (ref 5.0–8.0)

## 2016-09-30 LAB — SURGICAL PCR SCREEN
MRSA, PCR: NEGATIVE
STAPHYLOCOCCUS AUREUS: NEGATIVE

## 2016-09-30 LAB — APTT: aPTT: 29 seconds (ref 24–36)

## 2016-09-30 LAB — PROTIME-INR
INR: 1.02
PROTHROMBIN TIME: 13.4 s (ref 11.4–15.2)

## 2016-09-30 LAB — GLUCOSE, CAPILLARY: GLUCOSE-CAPILLARY: 114 mg/dL — AB (ref 65–99)

## 2016-09-30 NOTE — Progress Notes (Signed)
Pt denies cardiac history except for being told he has a heart murmur, but states it's never given him any problems. Pt is diabetic, diet controlled. CBG today was 114 and he had eaten 2 hours prior. States he does not check his blood sugar at home. States his most recent A1C was a month ago at Dr. Pennie Banter office. I have requested that result be faxed to [place].

## 2016-09-30 NOTE — Pre-Procedure Instructions (Signed)
Steven [name]  09/30/2016     Your procedure is scheduled on Monday, October 12, 2016 at 10:30 AM.   Report to [facility] Entrance "A" Admitting Office at 8:30 AM.   Call this number if you have problems the morning of surgery: [phone]   Questions prior to day of surgery, please call [phone] between 8 & 4 PM.   Remember:  Do not eat food or drink liquids after midnight Sunday, 10/11/16.  Take these medicines the morning of surgery with A SIP OF WATER: Citalopram (Celexa)  Stop NSAIDS (Meloxicam, Ibuprofen, Aleve, etc), Vitamins and Herbal medications 7 days prior to surgery.  Do not use Aspirin products 7 days prior to surgery.   How to Manage Your Diabetes Before Surgery   Why is it important to control my blood sugar before and after surgery?   Improving blood sugar levels before and after surgery helps healing and can limit problems.  A way of improving blood sugar control is eating a healthy diet by:  - Eating less sugar and carbohydrates  - Increasing activity/exercise  - Talk with your doctor about reaching your blood sugar goals  High blood sugars (greater than 180 mg/dL) can raise your risk of infections and slow down your recovery so you will need to focus on controlling your diabetes during the weeks before surgery.  Make sure that the doctor who takes care of your diabetes knows about your planned surgery including the date and location.  How do I manage my blood sugars before surgery?   Check your blood sugar at least 4 times a day, 2 days before surgery to make sure that they are not too high or low.  Check your blood sugar the morning of your surgery when you wake up and every 2 hours until you get to the Short-Stay unit.  Treat a low blood sugar (less than 70 mg/dL) with 1/2 cup of clear juice (cranberry or apple), 4 glucose tablets, OR glucose gel.  Recheck blood sugar in 15 minutes after treatment (to make sure it is greater than 70 mg/dL).   If blood sugar is not greater than 70 mg/dL on re-check, call [phone] for further instructions.   Report your blood sugar to the Short-Stay nurse when you get to Short-Stay.  References:  [facility], 2007 "How to Manage your Diabetes Before and After Surgery".   Do not wear jewelry.  Do not wear lotions, powders, cologne or deodorant.  Men may shave face and neck.  Do not bring valuables to the hospital.  [facility] is not responsible for any belongings or valuables.  Contacts, dentures or bridgework may not be worn into surgery.  Leave your suitcase in the car.  After surgery it may be brought to your room.  For patients admitted to the hospital, discharge time will be determined by your treatment team.  Special instructions:  [place] - Preparing for Surgery  Before surgery, you can play an important role.  Because skin is not sterile, your skin needs to be as free of germs as possible.  You can reduce the number of germs on you skin by washing with CHG (chlorahexidine gluconate) soap before surgery.  CHG is an antiseptic cleaner which kills germs and bonds with the skin to continue killing germs even after washing.  Please DO NOT use if you have an allergy to CHG or antibacterial soaps.  If your skin becomes reddened/irritated stop using the CHG  and inform your nurse when you arrive at Short Stay.  Do not shave (including legs and underarms) for at least 48 hours prior to the first CHG shower.  You may shave your face.  Please follow these instructions carefully:   1.  Shower with CHG Soap the night before surgery and the                    morning of Surgery.  2.  If you choose to wash your hair, wash your hair first as usual with your       normal shampoo.  3.  After you shampoo, rinse your hair and body thoroughly to remove the shampoo.  4.  Use CHG as you would any other liquid soap.  You can apply chg directly       to the skin and wash  gently with scrungie or a clean washcloth.  5.  Apply the CHG Soap to your body ONLY FROM THE NECK DOWN.        Do not use on open wounds or open sores.  Avoid contact with your eyes, ears, mouth and genitals (private parts).  Wash genitals (private parts) with your normal soap.  6.  Wash thoroughly, paying special attention to the area where your surgery        will be performed.  7.  Thoroughly rinse your body with warm water from the neck down.  8.  DO NOT shower/wash with your normal soap after using and rinsing off       the CHG Soap.  9.  Pat yourself dry with a clean towel.            10.  Wear clean pajamas.            11.  Place clean sheets on your bed the night of your first shower and do not        sleep with pets.  Day of Surgery  Do not apply any lotions/deodorants the morning of surgery.  Please wear clean clothes to the hospital.   Please read over the fact sheets that you were given.

## 2016-10-01 ENCOUNTER — Other Ambulatory Visit: Payer: Self-pay | Admitting: Rheumatology

## 2016-10-01 DIAGNOSIS — M450 Ankylosing spondylitis of multiple sites in spine: Secondary | ICD-10-CM

## 2016-10-02 DIAGNOSIS — T63451D Toxic effect of venom of hornets, accidental (unintentional), subsequent encounter: Secondary | ICD-10-CM | POA: Diagnosis not present

## 2016-10-02 DIAGNOSIS — T63441D Toxic effect of venom of bees, accidental (unintentional), subsequent encounter: Secondary | ICD-10-CM | POA: Diagnosis not present

## 2016-10-02 DIAGNOSIS — T63461D Toxic effect of venom of wasps, accidental (unintentional), subsequent encounter: Secondary | ICD-10-CM | POA: Diagnosis not present

## 2016-10-05 ENCOUNTER — Encounter (HOSPITAL_COMMUNITY): Payer: Self-pay | Admitting: Vascular Surgery

## 2016-10-05 DIAGNOSIS — D509 Iron deficiency anemia, unspecified: Secondary | ICD-10-CM | POA: Diagnosis not present

## 2016-10-05 DIAGNOSIS — R768 Other specified abnormal immunological findings in serum: Secondary | ICD-10-CM | POA: Diagnosis not present

## 2016-10-05 DIAGNOSIS — R109 Unspecified abdominal pain: Secondary | ICD-10-CM | POA: Diagnosis not present

## 2016-10-05 DIAGNOSIS — E119 Type 2 diabetes mellitus without complications: Secondary | ICD-10-CM | POA: Diagnosis not present

## 2016-10-05 NOTE — Progress Notes (Signed)
Left message for Dr. Pennie Banter nurse to fax over a1c results left call back number and fax number

## 2016-10-05 NOTE — Progress Notes (Signed)
Anesthesia Chart Review: Patient is a 82 year old male scheduled for right THA revision of acetabular component on 10/12/16 by Dr. Mayer Camel.  History includes former smoker, murmur (mid MR, trivial AR 09/2016), anxiety, depression, DM2 (diet controlled), anemia, exertional dyspnea, ankylosing spondylitis, diverticular abscess s/p resection/colostomy '82 s/p colostomy takedown, small bowel obstruction ('95, '06) s/p LOA '06, appendectomy, cholecystectomy 09/05/09, right THA '99 with revision 09/24/05, left rotator cuff repair 03/02/13.   - PCP is listed as Dr. Thressa Sheller.  - GI is Dr. Henrene Pastor with [facility]. - He was referred to cardiologist Dr. Quay Burow by Dr. Shelia Media for cardiovascular evaluation because of newly recognized DOE over the past six months. He was seen on 09/11/16. Patient denied chest pain. EKG showed right BBB. Dr. Gwenlyn Found ordered "routine GXT and a 2-D echocardiogram to further evaluate." Patient had the echo on 09/29/16, but it appears he was a "No Show" to the 09/23/16 exercise tolerance test.    Meds include Celexa, folic acid, Acular ophthalmic, methotrexate (weekly), Pred Forte 1% ophthalmic, Zocor, turmeric.  BP 103/67   Pulse 67   Temp 36.5 C   Resp 18   Ht 5\' 6"  (1.676 m)   Wt 134 lb 8 oz (61 kg)   SpO2 100%   BMI 21.71 kg/m   EKG 08/17/16: SR, first degree AV block negative precordial T waves. Incomplete right BBB pattern.   Echo 09/29/16: Study Conclusions - Left ventricle: The cavity size was normal. Wall thickness was   normal. Systolic function was normal. The estimated ejection   fraction was in the range of 60% to 65%. Wall motion was normal;   there were no regional wall motion abnormalities. Features are   consistent with a pseudonormal left ventricular filling pattern,   with concomitant abnormal relaxation and increased filling   pressure (grade 2 diastolic dysfunction). - Aortic valve: There was trivial regurgitation. - Mitral valve: There  was mild regurgitation. - Left atrium: The atrium was moderately dilated.  CXR 09/30/16: IMPRESSION: 1. Mild basilar atelectasis. 2. Mild cardiomegaly.  No pulmonary venous congestion. 3. Probable ankylosing spondylitis. Thoracic spine scoliosis and degenerative change.  CT abd/pelvis 08/13/16: IMPRESSION: 1. No acute or inflammatory process identified in the abdomen or pelvis. 2. Prior bowel surgery with no obstruction or adverse features identified. 3. Calcified aortic atherosclerosis. Mildly ectatic abdominal aorta at risk for aneurysm development. Recommend followup by ultrasound in 5 years. This recommendation follows ACR consensus guidelines: White Paper of the ACR Incidental Findings Committee II on Vascular Findings. J Am Coll Radiol 2013; 10:789-794. 4. Nephrolithiasis. 5. Ankylosing spondylitis.  Preoperative labs noted. BMET WNL. H/H 10.7/34.9. PLT 339. PT/PTT WNL. Glucose 78. T&S done. UA showed small leukocytes, negative nitrite. A1c was not done at PAT because patient reported last A1c at [facility] was ~ 1 month ago, but result received was from 04/01/16.   I left a voice message with Juliann Pulse at Dr. Damita Dunnings office indicating that since Dr. Gwenlyn Found ordered an ETT earlier this month he would need to provide preoperative input since patient has not had this done yet.   George Hugh [facility] Short Stay Center/Anesthesiology Phone [phone] 10/05/2016 5:49 PM

## 2016-10-06 DIAGNOSIS — J3081 Allergic rhinitis due to animal (cat) (dog) hair and dander: Secondary | ICD-10-CM | POA: Diagnosis not present

## 2016-10-06 DIAGNOSIS — J3089 Other allergic rhinitis: Secondary | ICD-10-CM | POA: Diagnosis not present

## 2016-10-06 DIAGNOSIS — J301 Allergic rhinitis due to pollen: Secondary | ICD-10-CM | POA: Diagnosis not present

## 2016-10-07 ENCOUNTER — Encounter (HOSPITAL_COMMUNITY): Payer: Medicare Other

## 2016-10-09 ENCOUNTER — Telehealth: Payer: Self-pay | Admitting: Cardiovascular Disease

## 2016-10-09 DIAGNOSIS — Z96649 Presence of unspecified artificial hip joint: Principal | ICD-10-CM

## 2016-10-09 DIAGNOSIS — T84018A Broken internal joint prosthesis, other site, initial encounter: Secondary | ICD-10-CM

## 2016-10-09 MED ORDER — TRANEXAMIC ACID 1000 MG/10ML IV SOLN
2000.0000 mg | INTRAVENOUS | Status: DC
  Filled 2016-10-09: qty 20

## 2016-10-09 MED ORDER — CEFAZOLIN SODIUM-DEXTROSE 2-4 GM/100ML-% IV SOLN: 2.0000 g | INTRAVENOUS | Status: DC

## 2016-10-09 MED ORDER — DEXTROSE-NACL 5-0.45 % IV SOLN: INTRAVENOUS | Status: DC

## 2016-10-09 NOTE — Telephone Encounter (Signed)
Per Dr. Gwenlyn Found, pt did not have his stress test and is NOT cleared for surgery. Faxed back surgical clearance form stating this.

## 2016-10-09 NOTE — H&P (Signed)
TOTAL HIP REVISION ADMISSION H&P  Patient is admitted for right revision total hip arthroplasty.  Subjective:  Chief Complaint: right hip pain  HPI: Steven [name], 82 y.o. male, has a history of pain and functional disability in the right hip due to [place] and patient has failed non-surgical conservative treatments for greater than 12 weeks to include NSAID's and/or analgesics, use of assistive devices and activity modification. The indications for the revision total hip arthroplasty are loosening of one or more components.  Onset of symptoms was gradual starting 2 years ago with gradually worsening course since that time.  Prior procedures on the right hip include arthroplasty.  Patient currently rates pain in the right hip at 10 out of 10 with activity.  There is night pain, worsening of pain with activity and weight bearing, trendelenberg gait, pain that interfers with activities of daily living and pain with passive range of motion. Patient has evidence of prosthetic loosening by imaging studies.  This condition presents safety issues increasing the risk of falls.   There is no current active infection.  Patient Active Problem List   Diagnosis Date Noted  . Failure of total hip arthroplasty ([place]) 10/09/2016    Priority: High  . Dyspnea on exertion 09/11/2016  . SBO (small bowel obstruction) ([place]) 03/21/2015  . Abdominal pain 04/10/2014  . Bowel obstruction ([place]) 04/10/2014  . Hyperlipidemia 04/10/2014  . History of ankylosing spondylitis 04/10/2014   Past Medical History:  Diagnosis Date  . Allergy    enviromental  . Anemia 2017  . Anxiety   . Arthritis    ankylosing spodilitis  . Blood transfusion without reported diagnosis    during surgery  . Cataract   . Depression   . Diabetes ([place]) 2017   Manages with diet  . Dyspnea    with exertion - had Echo done 09/29/16  . Heart murmur    states he's never had any problems  . History of kidney stones   .  Hyperlipidemia   . Intestinal obstruction ([place])   . Mental disorder   . Pneumonia    as a child    Past Surgical History:  Procedure Laterality Date  . ABDOMINAL SURGERY     had abcess  . APPENDECTOMY    . CHOLECYSTECTOMY    . COLONOSCOPY    . COLOSTOMY    . COLOSTOMY REVERSAL    . EYE SURGERY Left    scar tissue removed from cornea  . EYE SURGERY Right    cataract surgery with lens implant  . JOINT REPLACEMENT Right    hip  x 2 1999 and 2007  . SHOULDER ARTHROSCOPY WITH ROTATOR CUFF REPAIR AND SUBACROMIAL DECOMPRESSION Left 03/02/2013   Procedure: LEFT SHOULDER ARTHROSCOPY WITH SUBACROMIAL DECOMPRESSION DISTAL CALVICLE RESECTION AND ROTATOR CUFF REPAIR ;  Surgeon: [name], MD;  Location: [facility];  Service: Orthopedics;  Laterality: Left;    No prescriptions prior to admission.   Allergies  Allergen Reactions  . Gluten Meal     GLUTEN ALLERGY/INTOLERANCE  . Indomethacin Hives  . Lactose Intolerance (Gi)     GI UPSET    Social History  Substance Use Topics  . Smoking status: Former Research scientist (life sciences)  . Smokeless tobacco: Never Used  . Alcohol use Yes     Comment: 3 beers or glasses of wine a day     Family History  Problem Relation Age of Onset  . Heart attack Mother   . Cancer Mother   .  Diabetes Mother   . Heart attack Maternal Grandfather       Review of Systems  Constitutional: Negative.   HENT: Negative.   Eyes: Negative.   Respiratory: Negative.   Cardiovascular: Negative.   Gastrointestinal: Negative.   Genitourinary: Negative.   Musculoskeletal: Positive for joint pain.       Ankylosing spondylitis  Skin: Negative.   Neurological: Negative.   Endo/Heme/Allergies: Negative.   Psychiatric/Behavioral: Positive for depression.    Objective:  Physical Exam  Constitutional: He is oriented to person, place, and time. He appears well-developed and well-nourished.  HENT:  Head: Normocephalic and atraumatic.  Eyes: Pupils are equal, round, and reactive  to light.  Neck: Normal range of motion. Neck supple.  Cardiovascular: Intact distal pulses.   Respiratory: Effort normal.  Musculoskeletal: He exhibits tenderness.  the patient has moderate pain with palpation of the groin and buttock.  He has internal and external rotation to approximately 35 bilaterally.  Mild pain with heel bump.  Calves are soft and nontender.  He is neurovascularly intact distally.  Neurological: He is alert and oriented to person, place, and time.  Skin: Skin is warm and dry.  Psychiatric: He has a normal mood and affect. His behavior is normal. Judgment and thought content normal.    Vital signs in last 24 hours:     Labs:   Estimated body mass index is 21.71 kg/m as calculated from the following:   Height as of 09/30/16: 5\' 6"  (1.676 m).   Weight as of 09/30/16: 61 kg (134 lb 8 oz).  Imaging Review:  Plain radiographs demonstrate  AP of the pelvis and crosstable lateral of the right hip are reviewed in office today.  Again this shows a displaced acetabular component of his right total hip with subluxation of the femoral head.  He may actually be dislocated and weightbearing on the superior portion of the acetabulum.  Patient also has obvious ankylosing spondylitis visible on x-ray  Assessment/Plan:  End stage arthritis, right hip(s) with failed previous arthroplasty.  The patient history, physical examination, clinical judgement of the provider and imaging studies are consistent with end stage degenerative joint disease of the right hip(s), previous total hip arthroplasty. Revision total hip arthroplasty is deemed medically necessary. The treatment options including medical management, injection therapy, arthroscopy and arthroplasty were discussed at length. The risks and benefits of total hip arthroplasty were presented and reviewed. The risks due to aseptic loosening, infection, stiffness, dislocation/subluxation,  thromboembolic complications and other  imponderables were discussed.  The patient acknowledged the explanation, agreed to proceed with the plan and consent was signed. Patient is being admitted for inpatient treatment for surgery, pain control, PT, OT, prophylactic antibiotics, VTE prophylaxis, progressive ambulation and ADL's and discharge planning. The patient is planning to be discharged home with home health services

## 2016-10-09 NOTE — Telephone Encounter (Signed)
Follow up     Per Juliann Pulse they will need to have surgical clearance in writing faxed back to them, if yes or no and the reason, please

## 2016-10-09 NOTE — Telephone Encounter (Signed)
Follow up     DR Mayer Camel office has not heard back on the surgical clearance they faxed over, pt to have surgery on Monday

## 2016-10-09 NOTE — Telephone Encounter (Signed)
Per Gwenlyn Found assist pt NO showed for ETT, not cleared for surgery  Lmcb

## 2016-10-09 NOTE — Telephone Encounter (Signed)
Fax was given to [facility] to address with Dr Gwenlyn Found

## 2016-10-12 ENCOUNTER — Inpatient Hospital Stay (HOSPITAL_COMMUNITY): Admission: RE | Admit: 2016-10-12 | Payer: Medicare Other | Source: Ambulatory Visit | Admitting: Orthopedic Surgery

## 2016-10-12 ENCOUNTER — Encounter (HOSPITAL_COMMUNITY): Admission: RE | Payer: Self-pay | Source: Ambulatory Visit

## 2016-10-12 DIAGNOSIS — J3089 Other allergic rhinitis: Secondary | ICD-10-CM | POA: Diagnosis not present

## 2016-10-12 DIAGNOSIS — J301 Allergic rhinitis due to pollen: Secondary | ICD-10-CM | POA: Diagnosis not present

## 2016-10-12 DIAGNOSIS — J3081 Allergic rhinitis due to animal (cat) (dog) hair and dander: Secondary | ICD-10-CM | POA: Diagnosis not present

## 2016-10-12 SURGERY — TOTAL HIP REVISION
Anesthesia: Spinal | Laterality: Right

## 2016-10-13 ENCOUNTER — Encounter (HOSPITAL_COMMUNITY): Payer: Medicare Other

## 2016-10-15 ENCOUNTER — Ambulatory Visit
Admission: RE | Admit: 2016-10-15 | Discharge: 2016-10-15 | Disposition: A | Discharge status: Home / Self Care | Payer: Medicare Other | Source: Ambulatory Visit | Attending: Rheumatology | Admitting: Rheumatology

## 2016-10-15 DIAGNOSIS — M450 Ankylosing spondylitis of multiple sites in spine: Secondary | ICD-10-CM

## 2016-10-15 DIAGNOSIS — M47816 Spondylosis without myelopathy or radiculopathy, lumbar region: Secondary | ICD-10-CM | POA: Diagnosis not present

## 2016-10-15 LAB — TYPE AND SCREEN
ABO/RH(D): A POS
Antibody Screen: POSITIVE
DAT, IGG: NEGATIVE
UNIT DIVISION: 0
UNIT DIVISION: 0

## 2016-10-15 LAB — BPAM RBC
BLOOD PRODUCT EXPIRATION DATE: 201807112359
Blood Product Expiration Date: 201807112359
Unit Type and Rh: 6200
Unit Type and Rh: 6200

## 2016-10-16 ENCOUNTER — Ambulatory Visit (INDEPENDENT_AMBULATORY_CARE_PROVIDER_SITE_OTHER): Payer: Medicare Other | Admitting: Cardiovascular Disease

## 2016-10-16 ENCOUNTER — Encounter: Payer: Self-pay | Admitting: Cardiovascular Disease

## 2016-10-16 VITALS — BP 109/64 | HR 65 | Ht 66.5 in | Wt 136.4 lb

## 2016-10-16 DIAGNOSIS — R0609 Other forms of dyspnea: Secondary | ICD-10-CM | POA: Diagnosis not present

## 2016-10-16 DIAGNOSIS — Z01818 Encounter for other preprocedural examination: Secondary | ICD-10-CM | POA: Diagnosis not present

## 2016-10-16 NOTE — Assessment & Plan Note (Signed)
Mr. Dubois returns today for follow-up of his 2-D echo which was entirely normal. This was performed in the evaluation of dyspnea. He was also parenthetically found to be anemic by his PCP and this has been treated. His dyspnea has since resolved. He apparently needs a total hip replacement and is unable to exercise for stress test. Therefore we will order a pharmacologic Myoview stress test. Clear him for his upcoming hip arthroplasty.

## 2016-10-16 NOTE — Patient Instructions (Signed)
Medication Instructions: Your physician recommends that you continue on your current medications as directed. Please refer to the Current Medication list given to you today.   Testing/Procedures: Your physician has requested that you have a lexiscan myoview. For further information please visit [email or web address]. Please follow instruction sheet, as given.  Follow-Up: Your physician recommends that you schedule a follow-up appointment as needed with Dr. Berry.    

## 2016-10-16 NOTE — Progress Notes (Signed)
Steven [name] returns today for follow-up of his 2-D echo which was entirely normal. This was performed in the evaluation of dyspnea. He was also parenthetically found to be anemic by his PCP and this has been treated. His dyspnea has since resolved. He apparently needs a total hip replacement and is unable to exercise for stress test. Therefore we will order a pharmacologic Myoview stress test. Clear him for his upcoming hip arthroplasty.

## 2016-10-21 ENCOUNTER — Telehealth (HOSPITAL_COMMUNITY): Payer: Self-pay

## 2016-10-21 NOTE — Telephone Encounter (Signed)
Encounter complete. 

## 2016-10-22 DIAGNOSIS — Z125 Encounter for screening for malignant neoplasm of prostate: Secondary | ICD-10-CM | POA: Diagnosis not present

## 2016-10-22 DIAGNOSIS — E291 Testicular hypofunction: Secondary | ICD-10-CM | POA: Diagnosis not present

## 2016-10-22 DIAGNOSIS — E785 Hyperlipidemia, unspecified: Secondary | ICD-10-CM | POA: Diagnosis not present

## 2016-10-22 DIAGNOSIS — Z0000 Encounter for general adult medical examination without abnormal findings: Secondary | ICD-10-CM | POA: Diagnosis not present

## 2016-10-22 DIAGNOSIS — E1122 Type 2 diabetes mellitus with diabetic chronic kidney disease: Secondary | ICD-10-CM | POA: Diagnosis not present

## 2016-10-22 DIAGNOSIS — E559 Vitamin D deficiency, unspecified: Secondary | ICD-10-CM | POA: Diagnosis not present

## 2016-10-23 ENCOUNTER — Ambulatory Visit (HOSPITAL_COMMUNITY)
Admission: RE | Admit: 2016-10-23 | Discharge: 2016-10-23 | Disposition: A | Discharge status: Home / Self Care | Payer: Medicare Other | Source: Ambulatory Visit | Attending: Cardiology | Admitting: Cardiology

## 2016-10-23 DIAGNOSIS — Z01818 Encounter for other preprocedural examination: Secondary | ICD-10-CM | POA: Diagnosis not present

## 2016-10-23 DIAGNOSIS — I4510 Unspecified right bundle-branch block: Secondary | ICD-10-CM | POA: Diagnosis not present

## 2016-10-23 DIAGNOSIS — Z87891 Personal history of nicotine dependence: Secondary | ICD-10-CM | POA: Insufficient documentation

## 2016-10-23 DIAGNOSIS — R0609 Other forms of dyspnea: Secondary | ICD-10-CM | POA: Diagnosis not present

## 2016-10-23 DIAGNOSIS — Z8249 Family history of ischemic heart disease and other diseases of the circulatory system: Secondary | ICD-10-CM | POA: Diagnosis not present

## 2016-10-23 DIAGNOSIS — E119 Type 2 diabetes mellitus without complications: Secondary | ICD-10-CM | POA: Insufficient documentation

## 2016-10-23 LAB — MYOCARDIAL PERFUSION IMAGING
CHL CUP NUCLEAR SSS: 4
CHL CUP RESTING HR STRESS: 51 {beats}/min
LVDIAVOL: 110 mL (ref 62–150)
LVSYSVOL: 69 mL
NUC STRESS TID: 1.05
Peak HR: 63 {beats}/min
SDS: 1
SRS: 3

## 2016-10-23 MED ORDER — REGADENOSON 0.4 MG/5ML IV SOLN
0.4000 mg | Freq: Once | INTRAVENOUS | Status: AC
  Administered 2016-10-23: 0.4 mg via INTRAVENOUS

## 2016-10-23 MED ORDER — TECHNETIUM TC 99M TETROFOSMIN IV KIT
28.9000 | PACK | Freq: Once | INTRAVENOUS | Status: AC | PRN
  Administered 2016-10-23: 28.9 via INTRAVENOUS
  Filled 2016-10-23: qty 29

## 2016-10-23 MED ORDER — TECHNETIUM TC 99M TETROFOSMIN IV KIT
10.3000 | PACK | Freq: Once | INTRAVENOUS | Status: AC | PRN
  Administered 2016-10-23: 10.3 via INTRAVENOUS
  Filled 2016-10-23: qty 11

## 2016-10-26 DIAGNOSIS — J301 Allergic rhinitis due to pollen: Secondary | ICD-10-CM | POA: Diagnosis not present

## 2016-10-26 DIAGNOSIS — J3089 Other allergic rhinitis: Secondary | ICD-10-CM | POA: Diagnosis not present

## 2016-10-26 DIAGNOSIS — J3081 Allergic rhinitis due to animal (cat) (dog) hair and dander: Secondary | ICD-10-CM | POA: Diagnosis not present

## 2016-10-28 DIAGNOSIS — T63451D Toxic effect of venom of hornets, accidental (unintentional), subsequent encounter: Secondary | ICD-10-CM | POA: Diagnosis not present

## 2016-10-28 DIAGNOSIS — T63461D Toxic effect of venom of wasps, accidental (unintentional), subsequent encounter: Secondary | ICD-10-CM | POA: Diagnosis not present

## 2016-10-28 DIAGNOSIS — T63441D Toxic effect of venom of bees, accidental (unintentional), subsequent encounter: Secondary | ICD-10-CM | POA: Diagnosis not present

## 2016-10-29 ENCOUNTER — Other Ambulatory Visit: Payer: Self-pay | Admitting: Orthopedic Surgery

## 2016-11-02 DIAGNOSIS — D225 Melanocytic nevi of trunk: Secondary | ICD-10-CM | POA: Diagnosis not present

## 2016-11-02 DIAGNOSIS — J3089 Other allergic rhinitis: Secondary | ICD-10-CM | POA: Diagnosis not present

## 2016-11-02 DIAGNOSIS — L570 Actinic keratosis: Secondary | ICD-10-CM | POA: Diagnosis not present

## 2016-11-02 DIAGNOSIS — J3081 Allergic rhinitis due to animal (cat) (dog) hair and dander: Secondary | ICD-10-CM | POA: Diagnosis not present

## 2016-11-02 DIAGNOSIS — J301 Allergic rhinitis due to pollen: Secondary | ICD-10-CM | POA: Diagnosis not present

## 2016-11-02 DIAGNOSIS — X32XXXD Exposure to sunlight, subsequent encounter: Secondary | ICD-10-CM | POA: Diagnosis not present

## 2016-11-03 ENCOUNTER — Encounter (HOSPITAL_COMMUNITY)
Admission: RE | Admit: 2016-11-03 | Discharge: 2016-11-03 | Disposition: A | Discharge status: Home / Self Care | Payer: Medicare Other | Source: Ambulatory Visit | Attending: Orthopedic Surgery | Admitting: Orthopedic Surgery

## 2016-11-03 ENCOUNTER — Encounter (HOSPITAL_COMMUNITY): Payer: Self-pay

## 2016-11-03 DIAGNOSIS — M1611 Unilateral primary osteoarthritis, right hip: Secondary | ICD-10-CM

## 2016-11-03 DIAGNOSIS — Z96612 Presence of left artificial shoulder joint: Secondary | ICD-10-CM | POA: Diagnosis not present

## 2016-11-03 DIAGNOSIS — J301 Allergic rhinitis due to pollen: Secondary | ICD-10-CM | POA: Diagnosis not present

## 2016-11-03 DIAGNOSIS — Z96641 Presence of right artificial hip joint: Secondary | ICD-10-CM | POA: Diagnosis not present

## 2016-11-03 DIAGNOSIS — Z91018 Allergy to other foods: Secondary | ICD-10-CM | POA: Diagnosis not present

## 2016-11-03 DIAGNOSIS — J3081 Allergic rhinitis due to animal (cat) (dog) hair and dander: Secondary | ICD-10-CM | POA: Diagnosis not present

## 2016-11-03 DIAGNOSIS — Z91048 Other nonmedicinal substance allergy status: Secondary | ICD-10-CM | POA: Diagnosis not present

## 2016-11-03 DIAGNOSIS — E119 Type 2 diabetes mellitus without complications: Secondary | ICD-10-CM | POA: Diagnosis not present

## 2016-11-03 DIAGNOSIS — M459 Ankylosing spondylitis of unspecified sites in spine: Secondary | ICD-10-CM | POA: Diagnosis not present

## 2016-11-03 DIAGNOSIS — Z833 Family history of diabetes mellitus: Secondary | ICD-10-CM | POA: Diagnosis not present

## 2016-11-03 DIAGNOSIS — Z01812 Encounter for preprocedural laboratory examination: Secondary | ICD-10-CM

## 2016-11-03 DIAGNOSIS — E785 Hyperlipidemia, unspecified: Secondary | ICD-10-CM | POA: Diagnosis not present

## 2016-11-03 DIAGNOSIS — J3089 Other allergic rhinitis: Secondary | ICD-10-CM | POA: Diagnosis not present

## 2016-11-03 DIAGNOSIS — E739 Lactose intolerance, unspecified: Secondary | ICD-10-CM | POA: Diagnosis not present

## 2016-11-03 DIAGNOSIS — Z87891 Personal history of nicotine dependence: Secondary | ICD-10-CM | POA: Diagnosis not present

## 2016-11-03 LAB — CBC WITH DIFFERENTIAL/PLATELET
BASOS ABS: 0 10*3/uL (ref 0.0–0.1)
Basophils Relative: 0 %
Eosinophils Absolute: 0.4 10*3/uL (ref 0.0–0.7)
Eosinophils Relative: 6 %
HEMATOCRIT: 35.2 % — AB (ref 39.0–52.0)
Hemoglobin: 11.3 g/dL — ABNORMAL LOW (ref 13.0–17.0)
LYMPHS ABS: 1.3 10*3/uL (ref 0.7–4.0)
LYMPHS PCT: 18 %
MCH: 28.1 pg (ref 26.0–34.0)
MCHC: 32.1 g/dL (ref 30.0–36.0)
MCV: 87.6 fL (ref 78.0–100.0)
Monocytes Absolute: 0.6 10*3/uL (ref 0.1–1.0)
Monocytes Relative: 8 %
NEUTROS ABS: 4.6 10*3/uL (ref 1.7–7.7)
Neutrophils Relative %: 68 %
Platelets: 285 10*3/uL (ref 150–400)
RBC: 4.02 MIL/uL — AB (ref 4.22–5.81)
RDW: 20.8 % — ABNORMAL HIGH (ref 11.5–15.5)
WBC: 6.8 10*3/uL (ref 4.0–10.5)

## 2016-11-03 LAB — URINALYSIS, ROUTINE W REFLEX MICROSCOPIC
BACTERIA UA: NONE SEEN
BILIRUBIN URINE: NEGATIVE
Glucose, UA: NEGATIVE mg/dL
Hgb urine dipstick: NEGATIVE
KETONES UR: NEGATIVE mg/dL
Nitrite: NEGATIVE
PH: 6 (ref 5.0–8.0)
PROTEIN: NEGATIVE mg/dL
SQUAMOUS EPITHELIAL / LPF: NONE SEEN
Specific Gravity, Urine: 1.018 (ref 1.005–1.030)

## 2016-11-03 LAB — SURGICAL PCR SCREEN
MRSA, PCR: NEGATIVE
STAPHYLOCOCCUS AUREUS: NEGATIVE

## 2016-11-03 LAB — BASIC METABOLIC PANEL
ANION GAP: 7 (ref 5–15)
BUN: 13 mg/dL (ref 6–20)
CHLORIDE: 103 mmol/L (ref 101–111)
CO2: 27 mmol/L (ref 22–32)
Calcium: 9.2 mg/dL (ref 8.9–10.3)
Creatinine, Ser: 0.81 mg/dL (ref 0.61–1.24)
GFR calc Af Amer: >60 mL/min (ref >60)
Glucose, Bld: 87 mg/dL (ref 65–99)
POTASSIUM: 4.7 mmol/L (ref 3.5–5.1)
Sodium: 137 mmol/L (ref 135–145)

## 2016-11-03 LAB — PROTIME-INR
INR: 1
Prothrombin Time: 13.3 seconds (ref 11.4–15.2)

## 2016-11-03 LAB — GLUCOSE, CAPILLARY: Glucose-Capillary: 94 mg/dL (ref 65–99)

## 2016-11-03 LAB — APTT: APTT: 29 s (ref 24–36)

## 2016-11-03 NOTE — Progress Notes (Signed)
PCP: Dr. Thressa Sheller Cardiologist: Dr. Judeth Cornfield note from 10/16/2016 for clearance. Also under CV procedure tab--> results note on 10/25/2016  Pt reports no medical changes since surgery was cancelled 1 month ago: reason--needed cardiac clearance.  EKG: 08/2016 ECHO: 09/2016 Stress Test: 10/2016 Cardiac Cath: denies  Pt reports he was diagnosed with diabetes "2 years ago, diet controlled."  Pt does not have CBG meter to check glucose levels at home.   CBG at PAT appt= 94.  Patient denies shortness of breath, fever, cough, and chest pain at PAT appointment.  Patient verbalized understanding of instructions provided today at the PAT appointment.  Patient asked to review instructions at home and day of surgery.

## 2016-11-03 NOTE — Pre-Procedure Instructions (Signed)
Steven [name]  11/03/2016      [address], [place] [address] Phone: [phone] Fax: [phone]    Your procedure is scheduled on November 06, 2016.  Report to [facility] Admitting at 7:30 A.M.  Call this number if you have problems the morning of surgery:  [phone]   Call [phone] if you have any questions prior to your surgery date Monday-Friday 8am-4pm   Remember:  Do not eat food or drink liquids after midnight.  Take these medicines the morning of surgery with A SIP OF WATER Citalopram (Celexa), Eye drops, Acetaminophen (Tylenol) if needed.  7 days prior to surgery STOP taking any Aspirin, Aleve, Naproxen, Mobic, Meloxicam,  Ibuprofen, Motrin, Advil, Goody's, BC's, all herbal medications, fish oil, and all vitamins   Do not wear jewelry, make-up or nail polish.  Do not wear lotions, powders, or perfumes, or deoderant.  Do not shave 48 hours prior to surgery.  Men may shave face and neck.  Do not bring valuables to the hospital.   [facility] is not responsible for any belongings or valuables.  Contacts, dentures or bridgework may not be worn into surgery.  Leave your suitcase in the car.  After surgery it may be brought to your room.  For patients admitted to the hospital, discharge time will be determined by your treatment team.  Patients discharged the day of surgery will not be allowed to drive home.      How to Manage Your Diabetes Before and After Surgery  Why is it important to control my blood sugar before and after surgery? . Improving blood sugar levels before and after surgery helps healing and can limit problems. . A way of improving blood sugar control is eating a healthy diet by: o  Eating less sugar and carbohydrates o  Increasing activity/exercise o  Talking with your doctor about reaching your blood sugar goals . High blood sugars (greater than 180  mg/dL) can raise your risk of infections and slow your recovery, so you will need to focus on controlling your diabetes during the weeks before surgery. . Make sure that the doctor who takes care of your diabetes knows about your planned surgery including the date and location.  How do I manage my blood sugar before surgery? . Check your blood sugar at least 4 times a day, starting 2 days before surgery, to make sure that the level is not too high or low. o Check your blood sugar the morning of your surgery when you wake up and [name] 2 hours until you get to the Short Stay unit. . If your blood sugar is less than 70 mg/dL, you will need to treat for low blood sugar: o Do not take insulin. o Treat a low blood sugar (less than 70 mg/dL) with  cup of clear juice (cranberry or apple), 4 glucose tablets, OR glucose gel. o Recheck blood sugar in 15 minutes after treatment (to make sure it is greater than 70 mg/dL). If your blood sugar is not greater than 70 mg/dL on recheck, call [phone] for further instructions. . Report your blood sugar to the short stay nurse when you get to Short Stay.  . If you are admitted to the hospital after surgery: o Your blood sugar will be checked by the staff and you will probably be given insulin after surgery (instead of oral diabetes medicines) to make  sure you have good blood sugar levels. o The goal for blood sugar control after surgery is 80-180 mg/dL.      Patient Signature:  Date:   Nurse Signature:  Date:   Reviewed and Endorsed by [facility] Patient Education Committee, August 2015  Special instructions:   [facility]- Preparing For Surgery  Before surgery, you can play an important role. Because skin is not sterile, your skin needs to be as free of germs as possible. You can reduce the number of germs on your skin by washing with CHG (chlorahexidine gluconate) Soap before surgery.  CHG is an antiseptic cleaner which kills germs and bonds with  the skin to continue killing germs even after washing.  Please do not use if you have an allergy to CHG or antibacterial soaps. If your skin becomes reddened/irritated stop using the CHG.  Do not shave (including legs and underarms) for at least 48 hours prior to first CHG shower. It is OK to shave your face.  Please follow these instructions carefully.   1. Shower the NIGHT BEFORE SURGERY and the MORNING OF SURGERY with CHG.   2. If you chose to wash your hair, wash your hair first as usual with your normal shampoo.  3. After you shampoo, rinse your hair and body thoroughly to remove the shampoo.  4. Use CHG as you would any other liquid soap. You can apply CHG directly to the skin and wash gently with a scrungie or a clean washcloth.   5. Apply the CHG Soap to your body ONLY FROM THE NECK DOWN.  Do not use on open wounds or open sores. Avoid contact with your eyes, ears, mouth and genitals (private parts). Wash genitals (private parts) with your normal soap.  6. Wash thoroughly, paying special attention to the area where your surgery will be performed.  7. Thoroughly rinse your body with warm water from the neck down.  8. DO NOT shower/wash with your normal soap after using and rinsing off the CHG Soap.  9. Pat yourself dry with a CLEAN TOWEL.   10. Wear CLEAN PAJAMAS   11. Place CLEAN SHEETS on your bed the night of your first shower and DO NOT SLEEP WITH PETS.    Day of Surgery: Do not apply any deodorants/lotions. Please wear clean clothes to the hospital/surgery center.      Please read over the following fact sheets that you were given. Pain Booklet, Coughing and Deep Breathing, MRSA Information and Surgical Site Infection Prevention

## 2016-11-04 LAB — HEMOGLOBIN A1C
Hgb A1c MFr Bld: 4.6 % — ABNORMAL LOW (ref 4.8–5.6)
Mean Plasma Glucose: 85 mg/dL

## 2016-11-04 NOTE — H&P (Signed)
TOTAL HIP REVISION ADMISSION H&P  Patient is admitted for right revision total hip arthroplasty.  Subjective:  Chief Complaint: right hip pain  HPI: Steven [name], 82 y.o. male, has a history of pain and functional disability in the right hip due to failure of acetabular component and patient has failed non-surgical conservative treatments for greater than 12 weeks to include NSAID's and/or analgesics, use of assistive devices and activity modification. The indications for the revision total hip arthroplasty are loosening of one or more components.  Onset of symptoms was abrupt starting 1 years ago with rapidlly worsening course since that time.  Prior procedures on the right hip include arthroplasty.  Patient currently rates pain in the right hip at 10 out of 10 with activity.  There is worsening of pain with activity and weight bearing, trendelenberg gait and pain that interfers with activities of daily living. Patient has evidence of prosthetic loosening by imaging studies.  This condition presents safety issues increasing the risk of falls.    There is no current active infection.  Patient Active Problem List   Diagnosis Date Noted  . Failure of total hip arthroplasty ([place]) 10/09/2016    Priority: High  . Dyspnea on exertion 09/11/2016  . SBO (small bowel obstruction) ([place]) 03/21/2015  . Abdominal pain 04/10/2014  . Bowel obstruction ([place]) 04/10/2014  . Hyperlipidemia 04/10/2014  . History of ankylosing spondylitis 04/10/2014   Past Medical History:  Diagnosis Date  . Allergy    enviromental  . Anemia 2017  . Anxiety   . Arthritis    ankylosing spodilitis  . Blood transfusion without reported diagnosis    during surgery  . Cataract   . Depression   . Diabetes ([place]) 2017   Manages with diet  . Dyspnea    with exertion - had Echo done 09/29/16  . Heart murmur    states he's never had any problems  . History of kidney stones   . Hyperlipidemia   . Intestinal obstruction ([place])    . Mental disorder   . Pneumonia    as a child    Past Surgical History:  Procedure Laterality Date  . ABDOMINAL SURGERY     had abcess  . APPENDECTOMY    . CHOLECYSTECTOMY    . COLONOSCOPY    . COLOSTOMY    . COLOSTOMY REVERSAL    . EYE SURGERY Left    scar tissue removed from cornea  . EYE SURGERY Right    cataract surgery with lens implant  . JOINT REPLACEMENT Right    hip  x 2 1999 and 2007  . SHOULDER ARTHROSCOPY WITH ROTATOR CUFF REPAIR AND SUBACROMIAL DECOMPRESSION Left 03/02/2013   Procedure: LEFT SHOULDER ARTHROSCOPY WITH SUBACROMIAL DECOMPRESSION DISTAL CALVICLE RESECTION AND ROTATOR CUFF REPAIR ;  Surgeon: [name], MD;  Location: [facility];  Service: Orthopedics;  Laterality: Left;    No prescriptions prior to admission.   Allergies  Allergen Reactions  . Gluten Meal     GLUTEN ALLERGY/INTOLERANCE  . Indomethacin Hives  . Lactose Intolerance (Gi)     GI UPSET    Social History  Substance Use Topics  . Smoking status: Former Research scientist (life sciences)  . Smokeless tobacco: Never Used  . Alcohol use Yes     Comment: 3 beers or glasses of wine a day--reports stopped ETOH 11/01/16     Family History  Problem Relation Age of Onset  . Heart attack Mother   . Cancer Mother   . Diabetes  Mother   . Heart attack Maternal Grandfather       Review of Systems  Constitutional: Negative.   HENT: Negative.   Eyes: Negative.   Respiratory: Negative.   Cardiovascular: Negative.   Gastrointestinal: Negative.   Genitourinary: Negative.   Musculoskeletal: Positive for back pain and joint pain.  Skin: Negative.   Neurological: Negative.   Endo/Heme/Allergies: Negative.   Psychiatric/Behavioral: Positive for depression.    Objective:  Physical Exam  Constitutional: He is oriented to person, place, and time. He appears well-developed and well-nourished.  HENT:  Head: Normocephalic and atraumatic.  Eyes: Pupils are equal, round, and reactive to light.  Neck: Normal range of  motion. Neck supple.  Cardiovascular: Intact distal pulses.   Respiratory: Effort normal.  Musculoskeletal: Normal range of motion.  the patient has moderate pain with palpation of the groin and buttock.  He has internal and external rotation to approximately 35 bilaterally.  Mild pain with he will bump.  Calves are soft and nontender.  He is neurovascularly intact distally  Neurological: He is alert and oriented to person, place, and time.  Skin: Skin is warm and dry.  Psychiatric: He has a normal mood and affect. His behavior is normal. Judgment and thought content normal.    Vital signs in last 24 hours: Temp:  [97.8 F (36.6 C)] 97.8 F (36.6 C) (07/24 1441) Pulse Rate:  [61] 61 (07/24 1441) Resp:  [18] 18 (07/24 1441) BP: (112)/(51) 112/51 (07/24 1441) SpO2:  [97 %] 97 % (07/24 1441) Weight:  [61.2 kg (134 lb 14.4 oz)] 61.2 kg (134 lb 14.4 oz) (07/24 1441)   Labs:   Estimated body mass index is 21.13 kg/m as calculated from the following:   Height as of 11/03/16: 5\' 7"  (1.702 m).   Weight as of 11/03/16: 61.2 kg (134 lb 14.4 oz).  Imaging Review:  Plain radiographs demonstrate  AP of the pelvis and crosstable lateral of the right hip are reviewed in office today.  Again this shows a displaced acetabular component of his right total hip with subluxation of the femoral head.  He may actually be dislocated and weightbearing on the superior portion of the acetabulum.  Patient also has obvious ankylosing spondylitis visible on x-ray  Assessment/Plan:  End stage arthritis, right hip(s) with failed previous arthroplasty.  The patient history, physical examination, clinical judgement of the provider and imaging studies are consistent with end stage degenerative joint disease of the right hip(s), previous total hip arthroplasty. Revision total hip arthroplasty is deemed medically necessary. The treatment options including medical management, injection therapy, arthroscopy and  arthroplasty were discussed at length. The risks and benefits of total hip arthroplasty were presented and reviewed. The risks due to aseptic loosening, infection, stiffness, dislocation/subluxation,  thromboembolic complications and other imponderables were discussed.  The patient acknowledged the explanation, agreed to proceed with the plan and consent was signed. Patient is being admitted for inpatient treatment for surgery, pain control, PT, OT, prophylactic antibiotics, VTE prophylaxis, progressive ambulation and ADL's and discharge planning. The patient is planning to be discharged home with home health services.

## 2016-11-04 NOTE — Progress Notes (Signed)
Anesthesia Chart Review: Patient is a 82 year old male scheduled for right THA revision of acetabular component on 11/06/16 by Dr. Mayer Camel. Procedure was initially scheduled for 10/12/16, but had to be postponed to completed pending stress test. Since then Dr. Gwenlyn Found has cleared for surgery at "low risk."  History includes former smoker, murmur (mid MR, trivial AR 09/2016), anxiety, depression, DM2 (diet controlled), anemia, exertional dyspnea, ankylosing spondylitis, diverticular abscess s/p resection/colostomy '82 s/p colostomy takedown, small bowel obstruction ('95, '06) s/p LOA '06, appendectomy, cholecystectomy 09/05/09, right THA '99 with revision 09/24/05, left rotator cuff repair 03/02/13.   - PCP is listed as Dr. Thressa Sheller.  - GI is Dr. Henrene Pastor with [facility]. - He was referred to cardiologist Dr. Quay Burow by Dr. Shelia Media for cardiovascular evaluation because of newly recognized DOE over the past six months. He was seen on 09/11/16. Patient denied chest pain. EKG showed right BBB. Dr. Gwenlyn Found ordered "routine GXT and a 2-D echocardiogram to further evaluate." Patient had the echo on 09/29/16 and ultimately had a nuclear stress test 10/23/16 (see below). Last cardiology visit 10/16/16.  Meds include Celexa, folic acid, Acular ophthalmic, methotrexate (weekly), Zocor.  BP (!) 112/51   Pulse 61   Temp 36.6 C   Resp 18   Ht 5\' 7"  (1.702 m)   Wt 134 lb 14.4 oz (61.2 kg)   SpO2 97%   BMI 21.13 kg/m   EKG 08/17/16: SR, first degree AV block negative precordial T waves. Incomplete right BBB pattern.   Nuclear stress test 10/23/16:  The left ventricular ejection fraction is moderately decreased (30-44%).  Nuclear stress EF: 38%.  There was no ST segment deviation noted during stress.  No T wave inversion was noted during stress.  This is an intermediate risk study due to reduced systolic function.  No ischemia. (EF 60-65% by 09/29/16 echo.)  Echo 09/29/16: Study  Conclusions - Left ventricle: The cavity size was normal. Wall thickness was normal. Systolic function was normal. The estimated ejection fraction was in the range of 60% to 65%. Wall motion was normal; there were no regional wall motion abnormalities. Features are consistent with a pseudonormal left ventricular filling pattern, with concomitant abnormal relaxation and increased filling pressure (grade 2 diastolic dysfunction). - Aortic valve: There was trivial regurgitation. - Mitral valve: There was mild regurgitation. - Left atrium: The atrium was moderately dilated.  CXR 09/30/16: IMPRESSION: 1. Mild basilar atelectasis. 2. Mild cardiomegaly. No pulmonary venous congestion. 3. Probable ankylosing spondylitis. Thoracic spine scoliosis and degenerative change.  CT abd/pelvis 08/13/16: IMPRESSION: 1. No acute or inflammatory process identified in the abdomen or pelvis. 2. Prior bowel surgery with no obstruction or adverse features identified. 3. Calcified aortic atherosclerosis. Mildly ectatic abdominal aorta at risk for aneurysm development. Recommend followup by ultrasound in 5 years. This recommendation follows ACR consensus guidelines: White Paper of the ACR Incidental Findings Committee II on Vascular Findings. J Am Coll Radiol 2013; 10:789-794. 4. Nephrolithiasis. 5. Ankylosing spondylitis.  Preoperative labs noted. BMET WNL. H/H 11.3/35.2. PLT 285. PT/PTT WNL. Glucose 87. A1c 4.6. T&S done. UA showed trace leukocytes, negative nitrite.  If no acute changes then I would anticipate that he can proceed as planned.   George Hugh [facility] Short Stay Center/Anesthesiology Phone [phone] 11/04/2016 9:42 AM

## 2016-11-05 ENCOUNTER — Ambulatory Visit: Payer: Self-pay | Admitting: Internal Medicine

## 2016-11-05 DIAGNOSIS — J301 Allergic rhinitis due to pollen: Secondary | ICD-10-CM | POA: Diagnosis not present

## 2016-11-05 DIAGNOSIS — J3089 Other allergic rhinitis: Secondary | ICD-10-CM | POA: Diagnosis not present

## 2016-11-05 DIAGNOSIS — J3081 Allergic rhinitis due to animal (cat) (dog) hair and dander: Secondary | ICD-10-CM | POA: Diagnosis not present

## 2016-11-05 MED ORDER — TRANEXAMIC ACID 1000 MG/10ML IV SOLN
1000.0000 mg | INTRAVENOUS | Status: AC
  Administered 2016-11-06: 1000 mg via INTRAVENOUS
  Filled 2016-11-05: qty 10

## 2016-11-05 MED ORDER — BUPIVACAINE LIPOSOME 1.3 % IJ SUSP
20.0000 mL | INTRAMUSCULAR | Status: AC
  Administered 2016-11-06: 20 mL
  Filled 2016-11-05: qty 20

## 2016-11-05 MED ORDER — CEFAZOLIN SODIUM-DEXTROSE 2-4 GM/100ML-% IV SOLN
2.0000 g | INTRAVENOUS | Status: AC
  Administered 2016-11-06: 2 g via INTRAVENOUS
  Filled 2016-11-05: qty 100

## 2016-11-05 MED ORDER — TRANEXAMIC ACID 1000 MG/10ML IV SOLN
2000.0000 mg | INTRAVENOUS | Status: AC
  Administered 2016-11-06: 2000 mg via TOPICAL
  Filled 2016-11-05: qty 20

## 2016-11-06 ENCOUNTER — Encounter (HOSPITAL_COMMUNITY)
Admission: RE | Disposition: A | Discharge status: Home / Self Care | Payer: Self-pay | Source: Ambulatory Visit | Attending: Orthopedic Surgery

## 2016-11-06 ENCOUNTER — Encounter (HOSPITAL_COMMUNITY): Payer: Self-pay | Admitting: *Deleted

## 2016-11-06 ENCOUNTER — Inpatient Hospital Stay (HOSPITAL_COMMUNITY): Payer: Medicare Other | Admitting: Vascular Surgery

## 2016-11-06 ENCOUNTER — Inpatient Hospital Stay (HOSPITAL_COMMUNITY): Payer: Medicare Other | Admitting: Certified Registered Nurse Anesthetist

## 2016-11-06 ENCOUNTER — Inpatient Hospital Stay (HOSPITAL_COMMUNITY)
Admission: RE | Admit: 2016-11-06 | Discharge: 2016-11-08 | DRG: 468 | Disposition: A | Discharge status: Home / Self Care | Payer: Medicare Other | Source: Ambulatory Visit | Attending: Orthopedic Surgery | Admitting: Orthopedic Surgery

## 2016-11-06 ENCOUNTER — Inpatient Hospital Stay (HOSPITAL_COMMUNITY): Payer: Medicare Other

## 2016-11-06 DIAGNOSIS — Z96612 Presence of left artificial shoulder joint: Secondary | ICD-10-CM | POA: Diagnosis present

## 2016-11-06 DIAGNOSIS — Z91018 Allergy to other foods: Secondary | ICD-10-CM | POA: Diagnosis not present

## 2016-11-06 DIAGNOSIS — Z91048 Other nonmedicinal substance allergy status: Secondary | ICD-10-CM | POA: Diagnosis not present

## 2016-11-06 DIAGNOSIS — Z833 Family history of diabetes mellitus: Secondary | ICD-10-CM | POA: Diagnosis not present

## 2016-11-06 DIAGNOSIS — E785 Hyperlipidemia, unspecified: Secondary | ICD-10-CM | POA: Diagnosis not present

## 2016-11-06 DIAGNOSIS — M1611 Unilateral primary osteoarthritis, right hip: Secondary | ICD-10-CM | POA: Diagnosis not present

## 2016-11-06 DIAGNOSIS — E739 Lactose intolerance, unspecified: Secondary | ICD-10-CM | POA: Diagnosis present

## 2016-11-06 DIAGNOSIS — Z96641 Presence of right artificial hip joint: Secondary | ICD-10-CM | POA: Diagnosis present

## 2016-11-06 DIAGNOSIS — M459 Ankylosing spondylitis of unspecified sites in spine: Secondary | ICD-10-CM | POA: Diagnosis present

## 2016-11-06 DIAGNOSIS — Z87891 Personal history of nicotine dependence: Secondary | ICD-10-CM

## 2016-11-06 DIAGNOSIS — R109 Unspecified abdominal pain: Secondary | ICD-10-CM | POA: Diagnosis not present

## 2016-11-06 DIAGNOSIS — T84090A Other mechanical complication of internal right hip prosthesis, initial encounter: Secondary | ICD-10-CM | POA: Diagnosis not present

## 2016-11-06 DIAGNOSIS — R0602 Shortness of breath: Secondary | ICD-10-CM | POA: Diagnosis not present

## 2016-11-06 DIAGNOSIS — T84030A Mechanical loosening of internal right hip prosthetic joint, initial encounter: Secondary | ICD-10-CM | POA: Diagnosis not present

## 2016-11-06 DIAGNOSIS — T84019A Broken internal joint prosthesis, unspecified site, initial encounter: Secondary | ICD-10-CM

## 2016-11-06 DIAGNOSIS — E119 Type 2 diabetes mellitus without complications: Secondary | ICD-10-CM | POA: Diagnosis not present

## 2016-11-06 DIAGNOSIS — Z96649 Presence of unspecified artificial hip joint: Secondary | ICD-10-CM

## 2016-11-06 DIAGNOSIS — Z471 Aftercare following joint replacement surgery: Secondary | ICD-10-CM | POA: Diagnosis not present

## 2016-11-06 DIAGNOSIS — M25551 Pain in right hip: Secondary | ICD-10-CM | POA: Diagnosis present

## 2016-11-06 HISTORY — PX: TOTAL HIP REVISION: SHX763

## 2016-11-06 LAB — TYPE AND SCREEN
ABO/RH(D): A POS
Antibody Screen: POSITIVE

## 2016-11-06 LAB — GLUCOSE, CAPILLARY
GLUCOSE-CAPILLARY: 84 mg/dL (ref 65–99)
GLUCOSE-CAPILLARY: 155 mg/dL — AB (ref 65–99)
Glucose-Capillary: 77 mg/dL (ref 65–99)

## 2016-11-06 LAB — PREPARE RBC (CROSSMATCH)

## 2016-11-06 SURGERY — TOTAL HIP REVISION
Anesthesia: General | Site: Hip | Laterality: Right

## 2016-11-06 MED ORDER — APIXABAN 2.5 MG PO TABS
2.5000 mg | ORAL_TABLET | Freq: Two times a day (BID) | ORAL | Status: DC
  Administered 2016-11-07 – 2016-11-08 (×3): 2.5 mg via ORAL
  Filled 2016-11-06 (×3): qty 1

## 2016-11-06 MED ORDER — TIZANIDINE HCL 2 MG PO TABS: 2.0000 mg | ORAL_TABLET | Freq: Four times a day (QID) | ORAL | 0 refills | Status: DC | PRN

## 2016-11-06 MED ORDER — BISACODYL 5 MG PO TBEC: 5.0000 mg | DELAYED_RELEASE_TABLET | Freq: Every day | ORAL | Status: DC | PRN

## 2016-11-06 MED ORDER — FLEET ENEMA 7-19 GM/118ML RE ENEM: 1.0000 | ENEMA | Freq: Once | RECTAL | Status: DC | PRN

## 2016-11-06 MED ORDER — ALUM & MAG HYDROXIDE-SIMETH 200-200-20 MG/5ML PO SUSP: 30.0000 mL | ORAL | Status: DC | PRN

## 2016-11-06 MED ORDER — METOCLOPRAMIDE HCL 5 MG PO TABS: 5.0000 mg | ORAL_TABLET | Freq: Three times a day (TID) | ORAL | Status: DC | PRN

## 2016-11-06 MED ORDER — METHOCARBAMOL 500 MG PO TABS
500.0000 mg | ORAL_TABLET | Freq: Four times a day (QID) | ORAL | Status: DC | PRN
  Administered 2016-11-06 – 2016-11-07 (×2): 500 mg via ORAL
  Filled 2016-11-06 (×2): qty 1

## 2016-11-06 MED ORDER — BUPIVACAINE-EPINEPHRINE 0.5% -1:200000 IJ SOLN
INTRAMUSCULAR | Status: DC | PRN
  Administered 2016-11-06: 30 mL

## 2016-11-06 MED ORDER — PROPOFOL 10 MG/ML IV BOLUS
INTRAVENOUS | Status: AC
  Filled 2016-11-06: qty 20

## 2016-11-06 MED ORDER — SUCCINYLCHOLINE CHLORIDE 20 MG/ML IJ SOLN
INTRAMUSCULAR | Status: DC | PRN
  Administered 2016-11-06: 100 mg via INTRAVENOUS

## 2016-11-06 MED ORDER — ZOLPIDEM TARTRATE 5 MG PO TABS
5.0000 mg | ORAL_TABLET | Freq: Every evening | ORAL | Status: DC | PRN
  Administered 2016-11-06 – 2016-11-07 (×2): 5 mg via ORAL
  Filled 2016-11-06 (×2): qty 1

## 2016-11-06 MED ORDER — EPHEDRINE SULFATE 50 MG/ML IJ SOLN
INTRAMUSCULAR | Status: DC | PRN
  Administered 2016-11-06 (×2): 5 mg via INTRAVENOUS
  Administered 2016-11-06: 10 mg via INTRAVENOUS
  Administered 2016-11-06: 5 mg via INTRAVENOUS
  Administered 2016-11-06: 10 mg via INTRAVENOUS

## 2016-11-06 MED ORDER — FENTANYL CITRATE (PF) 100 MCG/2ML IJ SOLN
25.0000 ug | INTRAMUSCULAR | Status: DC | PRN
  Administered 2016-11-06 (×4): 25 ug via INTRAVENOUS

## 2016-11-06 MED ORDER — METOCLOPRAMIDE HCL 5 MG/ML IJ SOLN: 5.0000 mg | Freq: Three times a day (TID) | INTRAMUSCULAR | Status: DC | PRN

## 2016-11-06 MED ORDER — DEXAMETHASONE SODIUM PHOSPHATE 10 MG/ML IJ SOLN
INTRAMUSCULAR | Status: DC | PRN
  Administered 2016-11-06: 10 mg via INTRAVENOUS

## 2016-11-06 MED ORDER — DEXTROSE-NACL 5-0.45 % IV SOLN: INTRAVENOUS | Status: DC

## 2016-11-06 MED ORDER — ONDANSETRON HCL 4 MG/2ML IJ SOLN
INTRAMUSCULAR | Status: DC | PRN
  Administered 2016-11-06: 4 mg via INTRAVENOUS

## 2016-11-06 MED ORDER — OXYCODONE HCL 5 MG PO TABS
5.0000 mg | ORAL_TABLET | ORAL | Status: DC | PRN
  Administered 2016-11-06 – 2016-11-07 (×4): 10 mg via ORAL
  Filled 2016-11-06 (×4): qty 2

## 2016-11-06 MED ORDER — BUPIVACAINE-EPINEPHRINE (PF) 0.5% -1:200000 IJ SOLN
INTRAMUSCULAR | Status: AC
  Filled 2016-11-06: qty 30

## 2016-11-06 MED ORDER — 0.9 % SODIUM CHLORIDE (POUR BTL) OPTIME
TOPICAL | Status: DC | PRN
  Administered 2016-11-06: 1000 mL

## 2016-11-06 MED ORDER — PHENOL 1.4 % MT LIQD: 1.0000 | OROMUCOSAL | Status: DC | PRN

## 2016-11-06 MED ORDER — LACTATED RINGERS IV SOLN
INTRAVENOUS | Status: DC
  Administered 2016-11-06 (×2): via INTRAVENOUS

## 2016-11-06 MED ORDER — SODIUM CHLORIDE 0.9 % IV SOLN: Freq: Once | INTRAVENOUS | Status: DC

## 2016-11-06 MED ORDER — GABAPENTIN 300 MG PO CAPS
300.0000 mg | ORAL_CAPSULE | Freq: Three times a day (TID) | ORAL | Status: DC
Start: 2016-11-06 — End: 2016-11-08
  Administered 2016-11-06 – 2016-11-08 (×6): 300 mg via ORAL
  Filled 2016-11-06 (×6): qty 1

## 2016-11-06 MED ORDER — ACETAMINOPHEN 650 MG RE SUPP: 650.0000 mg | Freq: Four times a day (QID) | RECTAL | Status: DC | PRN

## 2016-11-06 MED ORDER — CITALOPRAM HYDROBROMIDE 40 MG PO TABS
40.0000 mg | ORAL_TABLET | Freq: Every day | ORAL | Status: DC
  Administered 2016-11-07 – 2016-11-08 (×2): 40 mg via ORAL
  Filled 2016-11-06 (×2): qty 1

## 2016-11-06 MED ORDER — METHOCARBAMOL 1000 MG/10ML IJ SOLN: 500.0000 mg | Freq: Four times a day (QID) | INTRAMUSCULAR | Status: DC | PRN

## 2016-11-06 MED ORDER — FENTANYL CITRATE (PF) 100 MCG/2ML IJ SOLN
INTRAMUSCULAR | Status: AC
  Administered 2016-11-06: 25 ug via INTRAVENOUS
  Filled 2016-11-06: qty 2

## 2016-11-06 MED ORDER — KETOROLAC TROMETHAMINE 0.5 % OP SOLN
1.0000 [drp] | Freq: Four times a day (QID) | OPHTHALMIC | Status: DC
  Administered 2016-11-06 – 2016-11-08 (×6): 1 [drp] via OPHTHALMIC
  Filled 2016-11-06: qty 3

## 2016-11-06 MED ORDER — KCL IN DEXTROSE-NACL 20-5-0.45 MEQ/L-%-% IV SOLN
INTRAVENOUS | Status: AC
  Filled 2016-11-06: qty 1000

## 2016-11-06 MED ORDER — SENNOSIDES-DOCUSATE SODIUM 8.6-50 MG PO TABS: 1.0000 | ORAL_TABLET | Freq: Every evening | ORAL | Status: DC | PRN

## 2016-11-06 MED ORDER — PHENYLEPHRINE HCL 10 MG/ML IJ SOLN
INTRAMUSCULAR | Status: DC | PRN
  Administered 2016-11-06: 30 ug/min via INTRAVENOUS

## 2016-11-06 MED ORDER — ONDANSETRON HCL 4 MG PO TABS: 4.0000 mg | ORAL_TABLET | Freq: Four times a day (QID) | ORAL | Status: DC | PRN

## 2016-11-06 MED ORDER — DOCUSATE SODIUM 100 MG PO CAPS
100.0000 mg | ORAL_CAPSULE | Freq: Two times a day (BID) | ORAL | Status: DC
  Administered 2016-11-06 – 2016-11-08 (×4): 100 mg via ORAL
  Filled 2016-11-06 (×4): qty 1

## 2016-11-06 MED ORDER — KCL IN DEXTROSE-NACL 20-5-0.45 MEQ/L-%-% IV SOLN
INTRAVENOUS | Status: DC
  Administered 2016-11-06 – 2016-11-07 (×3): via INTRAVENOUS
  Filled 2016-11-06 (×2): qty 1000

## 2016-11-06 MED ORDER — MIDAZOLAM HCL 2 MG/2ML IJ SOLN
INTRAMUSCULAR | Status: AC
  Filled 2016-11-06: qty 2

## 2016-11-06 MED ORDER — ONDANSETRON HCL 4 MG/2ML IJ SOLN: 4.0000 mg | Freq: Four times a day (QID) | INTRAMUSCULAR | Status: DC | PRN

## 2016-11-06 MED ORDER — HYDROMORPHONE HCL 1 MG/ML IJ SOLN: 0.5000 mg | INTRAMUSCULAR | Status: DC | PRN

## 2016-11-06 MED ORDER — CHLORHEXIDINE GLUCONATE 4 % EX LIQD: 60.0000 mL | Freq: Once | CUTANEOUS | Status: DC

## 2016-11-06 MED ORDER — DIPHENHYDRAMINE HCL 12.5 MG/5ML PO ELIX: 12.5000 mg | ORAL_SOLUTION | ORAL | Status: DC | PRN

## 2016-11-06 MED ORDER — PROPOFOL 10 MG/ML IV BOLUS
INTRAVENOUS | Status: DC | PRN
  Administered 2016-11-06: 160 mg via INTRAVENOUS

## 2016-11-06 MED ORDER — ALBUMIN HUMAN 5 % IV SOLN
INTRAVENOUS | Status: DC | PRN
  Administered 2016-11-06: 11:00:00 via INTRAVENOUS

## 2016-11-06 MED ORDER — APIXABAN 2.5 MG PO TABS: 2.5000 mg | ORAL_TABLET | Freq: Two times a day (BID) | ORAL | 0 refills | Status: DC

## 2016-11-06 MED ORDER — OXYCODONE-ACETAMINOPHEN 5-325 MG PO TABS: 1.0000 | ORAL_TABLET | ORAL | 0 refills | Status: DC | PRN

## 2016-11-06 MED ORDER — FENTANYL CITRATE (PF) 100 MCG/2ML IJ SOLN
INTRAMUSCULAR | Status: DC | PRN
  Administered 2016-11-06: 100 ug via INTRAVENOUS
  Administered 2016-11-06 (×2): 50 ug via INTRAVENOUS

## 2016-11-06 MED ORDER — ACETAMINOPHEN 325 MG PO TABS
650.0000 mg | ORAL_TABLET | Freq: Four times a day (QID) | ORAL | Status: DC | PRN
  Administered 2016-11-07 – 2016-11-08 (×2): 650 mg via ORAL
  Filled 2016-11-06 (×2): qty 2

## 2016-11-06 MED ORDER — SODIUM CHLORIDE 0.9 % IJ SOLN
INTRAMUSCULAR | Status: DC | PRN
  Administered 2016-11-06: 50 mL

## 2016-11-06 MED ORDER — LIDOCAINE HCL (CARDIAC) 20 MG/ML IV SOLN
INTRAVENOUS | Status: DC | PRN
  Administered 2016-11-06: 50 mg via INTRAVENOUS

## 2016-11-06 MED ORDER — ROCURONIUM BROMIDE 100 MG/10ML IV SOLN
INTRAVENOUS | Status: DC | PRN
  Administered 2016-11-06: 30 mg via INTRAVENOUS
  Administered 2016-11-06: 10 mg via INTRAVENOUS

## 2016-11-06 MED ORDER — DEXAMETHASONE SODIUM PHOSPHATE 10 MG/ML IJ SOLN
10.0000 mg | Freq: Once | INTRAMUSCULAR | Status: AC
  Administered 2016-11-07: 10 mg via INTRAVENOUS
  Filled 2016-11-06: qty 1

## 2016-11-06 MED ORDER — SUGAMMADEX SODIUM 200 MG/2ML IV SOLN
INTRAVENOUS | Status: DC | PRN
  Administered 2016-11-06: 120 mg via INTRAVENOUS

## 2016-11-06 MED ORDER — MENTHOL 3 MG MT LOZG
1.0000 | LOZENGE | OROMUCOSAL | Status: DC | PRN
Start: 2016-11-06 — End: 2016-11-08

## 2016-11-06 MED ORDER — FENTANYL CITRATE (PF) 250 MCG/5ML IJ SOLN
INTRAMUSCULAR | Status: AC
  Filled 2016-11-06: qty 5

## 2016-11-06 SURGICAL SUPPLY — 73 items
BAG DECANTER FOR FLEXI CONT (MISCELLANEOUS) ×2 IMPLANT
BLADE SAW SAG 73X25 THK (BLADE)
BLADE SAW SGTL 73X25 THK (BLADE) IMPLANT
BLADE SURG 10 STRL SS (BLADE) ×2 IMPLANT
BRUSH FEMORAL CANAL (MISCELLANEOUS) IMPLANT
COVER SURGICAL LIGHT HANDLE (MISCELLANEOUS) ×3 IMPLANT
CUP ACET PINNACLE SECTR 60MM (Hips) IMPLANT
DECANTER SPIKE VIAL GLASS SM (MISCELLANEOUS) ×2 IMPLANT
DRAPE C-ARM 42X72 X-RAY (DRAPES) IMPLANT
DRAPE HALF SHEET 40X57 (DRAPES) ×3 IMPLANT
DRAPE ORTHO SPLIT 77X108 STRL (DRAPES) ×3
DRAPE SURG ORHT 6 SPLT 77X108 (DRAPES) ×1 IMPLANT
DRAPE U-SHAPE 47X51 STRL (DRAPES) ×3 IMPLANT
DRILL BIT 7/64X5 (BIT) ×3 IMPLANT
DRSG AQUACEL AG ADV 3.5X10 (GAUZE/BANDAGES/DRESSINGS) ×3 IMPLANT
DURAPREP 26ML APPLICATOR (WOUND CARE) ×3 IMPLANT
ELECT BLADE 4.0 EZ CLEAN MEGAD (MISCELLANEOUS)
ELECT BLADE 6.5 EXT (BLADE) IMPLANT
ELECT REM PT RETURN 9FT ADLT (ELECTROSURGICAL) ×3
ELECTRODE BLDE 4.0 EZ CLN MEGD (MISCELLANEOUS) IMPLANT
ELECTRODE REM PT RTRN 9FT ADLT (ELECTROSURGICAL) ×1 IMPLANT
ELIMINATOR HOLE APEX DEPUY (Hips) ×2 IMPLANT
EVACUATOR 1/8 PVC DRAIN (DRAIN) IMPLANT
GAUZE SPONGE 4X4 12PLY STRL (GAUZE/BANDAGES/DRESSINGS) ×3 IMPLANT
GAUZE XEROFORM 5X9 LF (GAUZE/BANDAGES/DRESSINGS) ×3 IMPLANT
GLOVE BIO SURGEON STRL SZ7.5 (GLOVE) ×3 IMPLANT
GLOVE BIO SURGEON STRL SZ8.5 (GLOVE) ×6 IMPLANT
GLOVE BIOGEL PI IND STRL 8 (GLOVE) ×2 IMPLANT
GLOVE BIOGEL PI IND STRL 9 (GLOVE) ×1 IMPLANT
GLOVE BIOGEL PI INDICATOR 8 (GLOVE) ×4
GLOVE BIOGEL PI INDICATOR 9 (GLOVE) ×2
GOWN STRL REUS W/ TWL LRG LVL3 (GOWN DISPOSABLE) ×1 IMPLANT
GOWN STRL REUS W/ TWL XL LVL3 (GOWN DISPOSABLE) ×2 IMPLANT
GOWN STRL REUS W/TWL LRG LVL3 (GOWN DISPOSABLE) ×3
GOWN STRL REUS W/TWL XL LVL3 (GOWN DISPOSABLE) ×6
HANDPIECE INTERPULSE COAX TIP (DISPOSABLE)
HEAD M SROM 36MM PLUS 1.5 (Hips) IMPLANT
HOOD PEEL AWAY FACE SHEILD DIS (HOOD) ×6 IMPLANT
KIT BASIN OR (CUSTOM PROCEDURE TRAY) ×3 IMPLANT
KIT ROOM TURNOVER OR (KITS) ×3 IMPLANT
LINER NEUTRAL 60X36X58 P4 HIP (Liner) ×2 IMPLANT
MANIFOLD NEPTUNE II (INSTRUMENTS) ×3 IMPLANT
NEEDLE 22X1 1/2 (OR ONLY) (NEEDLE) ×7 IMPLANT
NS IRRIG 1000ML POUR BTL (IV SOLUTION) ×4 IMPLANT
PACK TOTAL JOINT (CUSTOM PROCEDURE TRAY) ×3 IMPLANT
PAD ARMBOARD 7.5X6 YLW CONV (MISCELLANEOUS) ×6 IMPLANT
PASSER SUT SWANSON 36MM LOOP (INSTRUMENTS) ×3 IMPLANT
PINNSECTOR W/GRIP ACE CUP 60MM (Hips) ×3 IMPLANT
PRESSURIZER FEMORAL UNIV (MISCELLANEOUS) IMPLANT
SCREW 6.5MMX25MM (Screw) ×2 IMPLANT
SET HNDPC FAN SPRY TIP SCT (DISPOSABLE) IMPLANT
SLEEVE SURGEON STRL (DRAPES) ×3 IMPLANT
SPONGE LAP 18X18 X RAY DECT (DISPOSABLE) IMPLANT
SROM M HEAD 36MM PLUS 1.5 (Hips) ×3 IMPLANT
STAPLER VISISTAT 35W (STAPLE) ×3 IMPLANT
SUT ETHIBOND 2 V 37 (SUTURE) ×3 IMPLANT
SUT VIC AB 0 CTX 36 (SUTURE) ×3
SUT VIC AB 0 CTX36XBRD ANTBCTR (SUTURE) ×1 IMPLANT
SUT VIC AB 1 CTX 36 (SUTURE) ×3
SUT VIC AB 1 CTX36XBRD ANBCTR (SUTURE) ×1 IMPLANT
SUT VIC AB 2-0 CT1 27 (SUTURE) ×3
SUT VIC AB 2-0 CT1 TAPERPNT 27 (SUTURE) ×1 IMPLANT
SUT VIC AB 3-0 CT1 27 (SUTURE) ×3
SUT VIC AB 3-0 CT1 TAPERPNT 27 (SUTURE) ×1 IMPLANT
SWAB COLLECTION DEVICE MRSA (MISCELLANEOUS) ×1 IMPLANT
SWAB CULTURE ESWAB REG 1ML (MISCELLANEOUS) ×1 IMPLANT
SYR 20ML ECCENTRIC (SYRINGE) ×3 IMPLANT
SYR CONTROL 10ML LL (SYRINGE) ×7 IMPLANT
TOWEL OR 17X24 6PK STRL BLUE (TOWEL DISPOSABLE) ×3 IMPLANT
TOWEL OR 17X26 10 PK STRL BLUE (TOWEL DISPOSABLE) ×3 IMPLANT
TOWER CARTRIDGE SMART MIX (DISPOSABLE) IMPLANT
TRAY FOLEY CATH SILVER 14FR (SET/KITS/TRAYS/PACK) IMPLANT
WATER STERILE IRR 1000ML POUR (IV SOLUTION) ×3 IMPLANT

## 2016-11-06 NOTE — Anesthesia Postprocedure Evaluation (Signed)
Anesthesia Post Note  Patient: Steven [name]  Procedure(s) Performed: Procedure(s) (LRB): TOTAL HIP REVISION OF THE ACETABULAR COMPONENT (Right)     Patient location during evaluation: PACU Anesthesia Type: General Level of consciousness: awake Pain management: pain level controlled Vital Signs Assessment: post-procedure vital signs reviewed and stable Respiratory status: spontaneous breathing Cardiovascular status: stable Postop Assessment: no signs of nausea or vomiting Anesthetic complications: no    Last Vitals:  Vitals:   11/06/16 0754 11/06/16 1232  BP: (!) 150/70   Pulse: (!) 57   Resp: 18   Temp: (!) 36.4 C 36.4 C    Last Pain:  Vitals:   11/06/16 1232  TempSrc:   PainSc: 0-No pain                 Bridgit Eynon

## 2016-11-06 NOTE — Anesthesia Procedure Notes (Addendum)
Procedure Name: Intubation Date/Time: 11/06/2016 10:29 AM Performed by: Merdis Delay Pre-anesthesia Checklist: Patient identified, Emergency Drugs available, Suction available, Patient being monitored and Timeout performed Patient Re-evaluated:Patient Re-evaluated prior to induction Oxygen Delivery Method: Circle system utilized Preoxygenation: Pre-oxygenation with 100% oxygen Induction Type: IV induction Ventilation: Mask ventilation without difficulty Laryngoscope Size: Glidescope and 4 Grade View: Grade I Tube type: Oral Tube size: 7.5 mm Number of attempts: 1 Airway Equipment and Method: Video-laryngoscopy Placement Confirmation: ETT inserted through vocal cords under direct vision,  positive ETCO2 and breath sounds checked- equal and bilateral Tube secured with: Tape Dental Injury: Teeth and Oropharynx as per pre-operative assessment  Difficulty Due To: Difficulty was anticipated, Difficult Airway- due to reduced neck mobility, Difficult Airway- due to limited oral opening and Difficult Airway- due to dentition Comments: Performed by Beverlee Nims huggins srna

## 2016-11-06 NOTE — Evaluation (Signed)
Physical Therapy Evaluation Patient Details Name: Steven [name] MRN: [id number] DOB: Sep 23, 1934 Today's Date: 11/06/2016   History of Present Illness  Pt is a 82 y/o male s/p R total hip revision with posterior hip precautions. PMH includes ankylosising spondylitis, anxiety, DM, depression, heart murmur, R THA, and L rotator cuff repair.   Clinical Impression  Pt s/p surgery above with deficits below. PTA, pt was independent with functional mobility. Upon eval, pt limited by post op pain and weakness, as well as, decreased balance. Pt requiring min guard to min A for functional mobility. Pt reports his wife will be available to assist as needed upon d/c and has all recommended DME. Recommending OT consult to ensure maintenance of precautions with ADLs. Follow up recommendations per MD arrangements. Will continue to follow acutely to maximize functional mobility independence.     Follow Up Recommendations DC plan and follow up therapy as arranged by surgeon;Supervision/Assistance - 24 hour    Equipment Recommendations  None recommended by PT    Recommendations for Other Services OT consult     Precautions / Restrictions Precautions Precautions: Posterior Hip Precaution Booklet Issued: Yes (comment) Precaution Comments: Reviewed posterior hip precautions with pt. Also reviewed supine ther ex and gabe THA handout.  Restrictions Weight Bearing Restrictions: Yes RLE Weight Bearing: Weight bearing as tolerated      Mobility  Bed Mobility Overal bed mobility: Needs Assistance Bed Mobility: Supine to Sit     Supine to sit: Min assist     General bed mobility comments: Min A and elevated HOB for trunk elevation. Verbal cues to maintain precautions when sitting EOB.   Transfers Overall transfer level: Needs assistance Equipment used: Rolling walker (2 wheeled) Transfers: Sit to/from Stand Sit to Stand: Min guard         General transfer comment: Min guard for safety. Mild  unsteadiness upon standing, however, no external assist required to maintain balance.   Ambulation/Gait Ambulation/Gait assistance: Min guard Ambulation Distance (Feet): 25 Feet Assistive device: Rolling walker (2 wheeled) Gait Pattern/deviations: Step-through pattern;Decreased step length - right;Decreased step length - left;Decreased weight shift to right;Antalgic Gait velocity: Decreased Gait velocity interpretation: Below normal speed for age/gender General Gait Details: Slow, antalgic gait. Verbal cues required for sequencing with RW. Pt had tendency to pick RW up and required verbal cues for appropriate use.   Stairs            Wheelchair Mobility    Modified Rankin (Stroke Patients Only)       Balance Overall balance assessment: Needs assistance Sitting-balance support: No upper extremity supported;Feet supported Sitting balance-[name]: Good     Standing balance support: Bilateral upper extremity supported;During functional activity Standing balance-[name]: Poor Standing balance comment: Reliant on UE support on RW.                              Pertinent Vitals/Pain Pain Assessment: 0-10 Pain Score: 1  Pain Location: R hip  Pain Descriptors / Indicators: Aching;Operative site guarding;Sore Pain Intervention(s): Limited activity within patient's tolerance;Monitored during session;Repositioned    Home Living Family/patient expects to be discharged to:: Private residence Living Arrangements: Spouse/significant other Available Help at Discharge: Family;Available 24 hours/day Type of Home: House Home Access: Stairs to enter Entrance Stairs-Rails: Psychiatric nurse of Steps: 5 Home Layout: Two level (loft is upstairs ) Home Equipment: Bedside commode;Walker - 2 wheels;Cane - single point;Shower seat - built in  Prior Function Level of Independence: Independent with assistive device(s)         Comments: Used cane  at baseline      Hand Dominance   Dominant Hand: Right    Extremity/Trunk Assessment   Upper Extremity Assessment Upper Extremity Assessment: Defer to OT evaluation    Lower Extremity Assessment Lower Extremity Assessment: RLE deficits/detail RLE Deficits / Details: Sensory in tact. Deficits consistent with post op pain and weakness. Able to perform exercises below.     Cervical / Trunk Assessment Cervical / Trunk Assessment: Normal  Communication   Communication: No difficulties  Cognition Arousal/Alertness: Awake/alert Behavior During Therapy: WFL for tasks assessed/performed Overall Cognitive Status: Within Functional Limits for tasks assessed                                        General Comments General comments (skin integrity, edema, etc.): Pt required cues to maintain hip precautions in sitting as pt had a tendency to want to cross legs. Hip abduction pillow placed as reminder to maintain hip precautions.     Exercises Total Joint Exercises Ankle Circles/Pumps: AROM;Both;10 reps;Supine Quad Sets: AROM;Right;10 reps;Supine Short Arc Quad: AROM;Right;10 reps;Supine Heel Slides: AROM;Right;10 reps;Supine Hip ABduction/ADduction: AROM;Right;10 reps;Supine   Assessment/Plan    PT Assessment Patient needs continued PT services  PT Problem List Decreased strength;Decreased range of motion;Decreased balance;Decreased mobility;Decreased knowledge of use of DME;Decreased knowledge of precautions;Pain       PT Treatment Interventions DME instruction;Gait training;Functional mobility training;Stair training;Therapeutic activities;Therapeutic exercise;Balance training;Neuromuscular re-education;Patient/family education    PT Goals (Current goals can be found in the Care Plan section)  Acute Rehab PT Goals Patient Stated Goal: to go home  PT Goal Formulation: With patient Time For Goal Achievement: 11/13/16 Potential to Achieve Goals: Good     Frequency 7X/week   Barriers to discharge        Co-evaluation               AM-PAC PT "6 Clicks" Daily Activity  Outcome Measure Difficulty turning over in bed (including adjusting bedclothes, sheets and blankets)?: Total Difficulty moving from lying on back to sitting on the side of the bed? : Total Difficulty sitting down on and standing up from a chair with arms (e.g., wheelchair, bedside commode, etc,.)?: Total Help needed moving to and from a bed to chair (including a wheelchair)?: A Little Help needed walking in hospital room?: A Little Help needed climbing 3-5 steps with a railing? : A Lot 6 Click Score: 11    End of Session Equipment Utilized During Treatment: Gait belt Activity Tolerance: Patient tolerated treatment well Patient left: in chair;with call bell/phone within reach;with nursing/sitter in room Nurse Communication: Mobility status PT Visit Diagnosis: Other abnormalities of gait and mobility (R26.89);Pain Pain - Right/Left: Right Pain - part of body: Hip    Time: 4562-5638 PT Time Calculation (min) (ACUTE ONLY): 30 min   Charges:   PT Evaluation $PT Eval Low Complexity: 1 Procedure PT Treatments $Gait Training: 8-22 mins   PT G Codes:        Steven [name], PT, DPT  Acute Rehabilitation Services  Pager: [phone]   Steven [name] 11/06/2016, 6:45 PM

## 2016-11-06 NOTE — Interval H&P Note (Signed)
History and Physical Interval Note:  11/06/2016 9:16 AM  Steven [name]  has presented today for surgery, with the diagnosis of RIGHT TOTAL HIP FAILURE OF IMPLANT  The various methods of treatment have been discussed with the patient and family. After consideration of risks, benefits and other options for treatment, the patient has consented to  Procedure(s): TOTAL HIP REVISION (Right) as a surgical intervention .  The patient's history has been reviewed, patient examined, no change in status, stable for surgery.  I have reviewed the patient's chart and labs.  Questions were answered to the patient's satisfaction.     Kerin Salen

## 2016-11-06 NOTE — Op Note (Signed)
Preop diagnosis: Status post revision right total up arthroplasty 2008 with gross loosening of the acetabular component over the last few years the DePuy sector shell has come loose and migrated inferiorly the femoral head is articulating with the pelvis  Postoperative diagnosis: Same  Procedure: Revision right total hip arthroplasty with removal of grossly loose 56 mm DePuy sector shell and revision to a 24mm DePuy Gryption cell with a single 25 mm dome screw 10 polyethylene liner index posterior superior to except a +0 36 mm femoral head on the original AML stem Surgeon: Kathalene Frames. Mayer Camel M.D.  Assistant: Kerry Hough. Barton Dubois  (present throughout entire procedure and necessary for timely completion of the procedure)  Estimated blood loss: 400 cc  Fluid replacement: Extreme 100 cc of crystalloid  Complications: None  Indications: Patient with an grossly loose DePuy sector shell that has migrated inferiorly over the last couple of years he was placed as a revision in the year 2008 and a slightly vertical position. Metal head is articulating with the pelvis patient has remarkably little pain. He also has severe ankylosing spondylitis with a bamboo spine. CT scan shows minimal loss of bone in the acetabulum. Plan is to remove the loose acetabular component and revise to a new Gryption shell with a single dome screw and the polyethylene liner. Risks and benefits of revision surgery have been discussed and questions answered.  Procedure: Patient was identified by arm band receive preoperative IV antibiotics in the holding area at, and hospital. He was then taken to the operating room where the appropriate anesthetic monitors were attached and general endotracheal anesthesia induced with the patient in the supine position. He was then rolled into the left lateral decubitus position and fixed there with a mark 2 pelvic clamp. A Foley catheter was inserted and the limb prepped and draped in usual sterile  fashion from the ankle to the hemipelvis. Time out procedure performed. Skin along the lateral hip and thigh infiltrated with 10 cc of 1/2% Marcaine and epinephrine solution. We began the operation by recreating the old posterior lateral incision 15 cm in line through the skin and subcutaneous tissue down to the level of the IT band which was cut in line with the skin incision. The posterior capsule was scarred down and taken down with the electrocautery recruiting old posterior capsulotomy. We medially encountered the metal shell which had rotated inferiorly and ends anteversion. After removing scar tissue from around the neck were able to dislocate the AML stem and ball and remove the ball with a mallet and metal cylinder. The trunnion was then tucked superiorly and anteriorly allowing [place] to remove scar tissue from around the acetabular component the polyethylene liner was removed with metal osteotomes and then using Innomed osteotomes we freed up the shell from the fibrous tissue. Once it had been removed we assess the acetabulum and had some good bone to work with. We sequentially reamed up to a 70mm hemispherical reamer obtaining good coverage in all quadrants. The wound is irrigated with normal saline solution the bone was then irrigated with X roll for anesthetic we then hammered into place a new 58mm DePuy Gryption liner with a single 25 mm dome screw followed by a 10 polyethylene liner index posterior and superior. We performed a trial reduction with a +0 36 mm head and found excellent stability to flexion of 90 with internal rotation of 70. The hip came to full extension with a knee flexing 110. At this point a real +  0 36 mm metal head was hammered onto the trunnion and the hip again reduced. The pseudocapsule was repaired back to the intertrochanteric crest through drill holes with a #2 Ethibond suture. The wound was once again irrigated out normal saline solution. We then closed in layers with  running 1 Vicryl suture in the IT band and subcutaneous 0 and 2-0 Vicryl suture followed by subcuticular 3-0 Vicryl suture followed by an Aquasol dressing. The patient was then carefully rolled supine awakened extubated and taken to the recovery without difficulty.

## 2016-11-06 NOTE — Discharge Instructions (Addendum)
INSTRUCTIONS AFTER JOINT REPLACEMENT  ° °o Remove items at home which could result in a fall. This includes throw rugs or furniture in walking pathways °o ICE to the affected joint every three hours while awake for 30 minutes at a time, for at least the first 3-5 days, and then as needed for pain and swelling.  Continue to use ice for pain and swelling. You may notice swelling that will progress down to the foot and ankle.  This is normal after surgery.  Elevate your leg when you are not up walking on it.   °o Continue to use the breathing machine you got in the hospital (incentive spirometer) which will help keep your temperature down.  It is common for your temperature to cycle up and down following surgery, especially at night when you are not up moving around and exerting yourself.  The breathing machine keeps your lungs expanded and your temperature down. ° ° °DIET:  As you were doing prior to hospitalization, we recommend a well-balanced diet. ° °DRESSING / WOUND CARE / SHOWERING ° °Keep the surgical dressing until follow up.  The dressing is water proof, so you can shower without any extra covering.  IF THE DRESSING FALLS OFF or the wound gets wet inside, change the dressing with sterile gauze.  Please use good hand washing techniques before changing the dressing.  Do not use any lotions or creams on the incision until instructed by your surgeon.   ° °ACTIVITY ° °o Increase activity slowly as tolerated, but follow the weight bearing instructions below.   °o No driving for 6 weeks or until further direction given by your physician.  You cannot drive while taking narcotics.  °o No lifting or carrying greater than 10 lbs. until further directed by your surgeon. °o Avoid periods of inactivity such as sitting longer than an hour when not asleep. This helps prevent blood clots.  °o You may return to work once you are authorized by your doctor.  ° ° ° °WEIGHT BEARING  ° °Weight bearing as tolerated with assist  device (walker, cane, etc) as directed, use it as long as suggested by your surgeon or therapist, typically at least 4-6 weeks. ° ° °EXERCISES ° °Results after joint replacement surgery are often greatly improved when you follow the exercise, range of motion and muscle strengthening exercises prescribed by your doctor. Safety measures are also important to protect the joint from further injury. Any time any of these exercises cause you to have increased pain or swelling, decrease what you are doing until you are comfortable again and then slowly increase them. If you have problems or questions, call your caregiver or physical therapist for advice.  ° °Rehabilitation is important following a joint replacement. After just a few days of immobilization, the muscles of the leg can become weakened and shrink (atrophy).  These exercises are designed to build up the tone and strength of the thigh and leg muscles and to improve motion. Often times heat used for twenty to thirty minutes before working out will loosen up your tissues and help with improving the range of motion but do not use heat for the first two weeks following surgery (sometimes heat can increase post-operative swelling).  ° °These exercises can be done on a training (exercise) mat, on the floor, on a table or on a bed. Use whatever works the best and is most comfortable for you.    Use music or television while you are exercising so that   the exercises are a pleasant break in your day. This will make your life better with the exercises acting as a break in your routine that you can look forward to.   Perform all exercises about fifteen times, three times per day or as directed.  You should exercise both the operative leg and the other leg as well. ° °Exercises include: °  °• Quad Sets - Tighten up the muscle on the front of the thigh (Quad) and hold for 5-10 seconds.   °• Straight Leg Raises - With your knee straight (if you were given a brace, keep it on),  lift the leg to 60 degrees, hold for 3 seconds, and slowly lower the leg.  Perform this exercise against resistance later as your leg gets stronger.  °• Leg Slides: Lying on your back, slowly slide your foot toward your buttocks, bending your knee up off the floor (only go as far as is comfortable). Then slowly slide your foot back down until your leg is flat on the floor again.  °• Angel Wings: Lying on your back spread your legs to the side as far apart as you can without causing discomfort.  °• Hamstring Strength:  Lying on your back, push your heel against the floor with your leg straight by tightening up the muscles of your buttocks.  Repeat, but this time bend your knee to a comfortable angle, and push your heel against the floor.  You may put a pillow under the heel to make it more comfortable if necessary.  ° °A rehabilitation program following joint replacement surgery can speed recovery and prevent re-injury in the future due to weakened muscles. Contact your doctor or a physical therapist for more information on knee rehabilitation.  ° ° °CONSTIPATION ° °Constipation is defined medically as fewer than three stools per week and severe constipation as less than one stool per week.  Even if you have a regular bowel pattern at home, your normal regimen is likely to be disrupted due to multiple reasons following surgery.  Combination of anesthesia, postoperative narcotics, change in appetite and fluid intake all can affect your bowels.  ° °YOU MUST use at least one of the following options; they are listed in order of increasing strength to get the job done.  They are all available over the counter, and you may need to use some, POSSIBLY even all of these options:   ° °Drink plenty of fluids (prune juice may be helpful) and high fiber foods °Colace 100 mg by mouth twice a day  °Senokot for constipation as directed and as needed Dulcolax (bisacodyl), take with full glass of water  °Miralax (polyethylene glycol)  once or twice a day as needed. ° °If you have tried all these things and are unable to have a bowel movement in the first 3-4 days after surgery call either your surgeon or your primary doctor.   ° °If you experience loose stools or diarrhea, hold the medications until you stool forms back up.  If your symptoms do not get better within 1 week or if they get worse, check with your doctor.  If you experience "the worst abdominal pain ever" or develop nausea or vomiting, please contact the office immediately for further recommendations for treatment. ° ° °ITCHING:  If you experience itching with your medications, try taking only a single pain pill, or even half a pain pill at a time.  You can also use Benadryl over the counter for itching or also to   help with sleep.  ° °TED HOSE STOCKINGS:  Use stockings on both legs until for at least 2 weeks or as directed by physician office. They may be removed at night for sleeping. ° °MEDICATIONS:  See your medication summary on the “After Visit Summary” that nursing will review with you.  You may have some home medications which will be placed on hold until you complete the course of blood thinner medication.  It is important for you to complete the blood thinner medication as prescribed. ° °PRECAUTIONS:  If you experience chest pain or shortness of breath - call 911 immediately for transfer to the hospital emergency department.  ° °If you develop a fever greater that 101 F, purulent drainage from wound, increased redness or drainage from wound, foul odor from the wound/dressing, or calf pain - CONTACT YOUR SURGEON.   °                                                °FOLLOW-UP APPOINTMENTS:  If you do not already have a post-op appointment, please call the office for an appointment to be seen by your surgeon.  Guidelines for how soon to be seen are listed in your “After Visit Summary”, but are typically between 1-4 weeks after surgery. ° °OTHER INSTRUCTIONS:  ° °Knee  Replacement:  Do not place pillow under knee, focus on keeping the knee straight while resting. CPM instructions: 0-90 degrees, 2 hours in the morning, 2 hours in the afternoon, and 2 hours in the evening. Place foam block, curve side up under heel at all times except when in CPM or when walking.  DO NOT modify, tear, cut, or change the foam block in any way. ° °MAKE SURE YOU:  °• Understand these instructions.  °• Get help right away if you are not doing well or get worse.  ° ° °Thank you for letting us be a part of your medical care team.  It is a privilege we respect greatly.  We hope these instructions will help you stay on track for a fast and full recovery!  ° ° °Information on my medicine - ELIQUIS® (apixaban) ° °Why was Eliquis® prescribed for you? °Eliquis® was prescribed for you to reduce the risk of blood clots forming after orthopedic surgery.   ° °What do You need to know about Eliquis®? °Take your Eliquis® TWICE DAILY - one tablet in the morning and one tablet in the evening with or without food.  It would be best to take the dose about the same time each day. ° °If you have difficulty swallowing the tablet whole please discuss with your pharmacist how to take the medication safely. ° °Take Eliquis® exactly as prescribed by your doctor and DO NOT stop taking Eliquis® without talking to the doctor who prescribed the medication.  Stopping without other medication to take the place of Eliquis® may increase your risk of developing a clot. ° °After discharge, you should have regular check-up appointments with your healthcare provider that is prescribing your Eliquis®. ° °What do you do if you miss a dose? °If a dose of ELIQUIS® is not taken at the scheduled time, take it as soon as possible on the same day and twice-daily administration should be resumed.  The dose should not be doubled to make up for a missed dose.  Do not take more   than one tablet of ELIQUIS at the same time. ° °Important Safety  Information °A possible side effect of Eliquis® is bleeding. You should call your healthcare provider right away if you experience any of the following: °? Bleeding from an injury or your nose that does not stop. °? Unusual colored urine (red or dark brown) or unusual colored stools (red or black). °? Unusual bruising for unknown reasons. °? A serious fall or if you hit your head (even if there is no bleeding). ° °Some medicines may interact with Eliquis® and might increase your risk of bleeding or clotting while on Eliquis®. To help avoid this, consult your healthcare provider or pharmacist prior to using any new prescription or non-prescription medications, including herbals, vitamins, non-steroidal anti-inflammatory drugs (NSAIDs) and supplements. ° °This website has more information on Eliquis® (apixaban): http://www.eliquis.com/eliquis/home ° °

## 2016-11-06 NOTE — Transfer of Care (Signed)
Immediate Anesthesia Transfer of Care Note  Patient: Steven [name]  Procedure(s) Performed: Procedure(s): TOTAL HIP REVISION OF THE ACETABULAR COMPONENT (Right)  Patient Location: PACU  Anesthesia Type:General  Level of Consciousness: drowsy  Airway & Oxygen Therapy: Patient Spontanous Breathing and Patient connected to face mask oxygen  Post-op Assessment: Report given to RN and Post -op Vital signs reviewed and stable  Post vital signs: Reviewed and stable  Last Vitals:  Vitals:   11/06/16 0754  BP: (!) 150/70  Pulse: (!) 57  Resp: 18  Temp: (!) 36.4 C    Last Pain:  Vitals:   11/06/16 0754  TempSrc: Oral      Patients Stated Pain Goal: 3 (73/54/30 1484)  Complications: No apparent anesthesia complications

## 2016-11-06 NOTE — Anesthesia Preprocedure Evaluation (Addendum)
Anesthesia Evaluation  Patient identified by MRN, date of birth, ID band Patient awake  General Assessment Comment:HX Ankylosing spondylitis CG  Reviewed: Allergy & Precautions, NPO status , Patient's Chart, lab work & pertinent test results  Airway Mallampati: IV   Neck ROM: Limited  Mouth opening: Limited Mouth Opening Comment: History of Ank spon. Chart and previous surgery record noted. CG Dental   Pulmonary shortness of breath, pneumonia, former smoker,    breath sounds clear to auscultation       Cardiovascular + Valvular Problems/Murmurs  Rhythm:Regular Rate:Normal  History noted CG   Neuro/Psych Anxiety Depression    GI/Hepatic negative GI ROS, Neg liver ROS,   Endo/Other  diabetes  Renal/GU      Musculoskeletal  (+) Arthritis ,   Abdominal   Peds  Hematology  (+) anemia ,   Anesthesia Other Findings   Reproductive/Obstetrics                           Anesthesia Physical Anesthesia Plan  ASA: III  Anesthesia Plan: General   Post-op Pain Management:    Induction: Intravenous  PONV Risk Score and Plan: 2 and Ondansetron, Dexamethasone and Treatment may vary due to age or medical condition  Airway Management Planned: Video Laryngoscope Planned and Oral ETT  Additional Equipment:   Intra-op Plan:   Post-operative Plan: Possible Post-op intubation/ventilation  Informed Consent: I have reviewed the patients History and Physical, chart, labs and discussed the procedure including the risks, benefits and alternatives for the proposed anesthesia with the patient or authorized representative who has indicated his/her understanding and acceptance.   Dental advisory given  Plan Discussed with: CRNA and Anesthesiologist  Anesthesia Plan Comments:       Anesthesia Quick Evaluation

## 2016-11-07 LAB — CBC
HCT: 28.5 % — ABNORMAL LOW (ref 39.0–52.0)
HEMOGLOBIN: 9.3 g/dL — AB (ref 13.0–17.0)
MCH: 28.9 pg (ref 26.0–34.0)
MCHC: 32.6 g/dL (ref 30.0–36.0)
MCV: 88.5 fL (ref 78.0–100.0)
Platelets: 230 10*3/uL (ref 150–400)
RBC: 3.22 MIL/uL — AB (ref 4.22–5.81)
RDW: 20.7 % — ABNORMAL HIGH (ref 11.5–15.5)
WBC: 11.8 10*3/uL — AB (ref 4.0–10.5)

## 2016-11-07 NOTE — Plan of Care (Signed)
Problem: Education: Goal: Knowledge of [facility] Education information/materials will improve Outcome: Progressing POC reviewed with pt.   

## 2016-11-07 NOTE — Progress Notes (Signed)
Physical Therapy Treatment Patient Details Name: Steven [name] MRN: [id number] DOB: 10-Sep-1934 Today's Date: 11/07/2016    History of Present Illness Pt is a 82 y/o male s/p R total hip revision with posterior hip precautions. PMH includes ankylosising spondylitis, anxiety, DM, depression, heart murmur, R THA, and L rotator cuff repair.     PT Comments    Patient continues to progress well with mobility. Pt tolerated increased gait distance and completion of HEP. Current plan remains appropriate.    Follow Up Recommendations  DC plan and follow up therapy as arranged by surgeon;Supervision/Assistance - 24 hour     Equipment Recommendations  None recommended by PT    Recommendations for Other Services       Precautions / Restrictions Precautions Precautions: Posterior Hip Precaution Booklet Issued: Yes (comment) Restrictions Weight Bearing Restrictions: Yes RLE Weight Bearing: Weight bearing as tolerated    Mobility  Bed Mobility               General bed mobility comments: pt OOB in chair upon arrival  Transfers Overall transfer level: Needs assistance Equipment used: Rolling walker (2 wheeled) Transfers: Sit to/from Stand Sit to Stand: Min guard         General transfer comment: min guard for safety  Ambulation/Gait Ambulation/Gait assistance: Supervision Ambulation Distance (Feet): 500 Feet Assistive device: Rolling walker (2 wheeled) Gait Pattern/deviations: Step-through pattern;Decreased weight shift to right;Decreased stride length;Trunk flexed;Narrow base of support     General Gait Details: cues for posture; improved step length symmetry and cadence   Stairs            Wheelchair Mobility    Modified Rankin (Stroke Patients Only)       Balance Overall balance assessment: Needs assistance Sitting-balance support: Feet supported Sitting balance-[name]: Good     Standing balance support: Bilateral upper extremity  supported Standing balance-[name]: Poor Standing balance comment: RW for support                            Cognition Arousal/Alertness: Awake/alert Behavior During Therapy: WFL for tasks assessed/performed Overall Cognitive Status: Within Functional Limits for tasks assessed                                        Exercises Total Joint Exercises Quad Sets: AROM;Both;10 reps Short Arc Quad: AROM;Right;10 reps Heel Slides: AROM;Right;10 reps Hip ABduction/ADduction: AROM;Right;10 reps;Seated;Standing;Other (comment) (10 reps seated and 10 reps standing) Long Arc Quad: AROM;Right;10 reps Knee Flexion: AROM;Right;10 reps;Standing Marching in Standing: AROM;Right;10 reps;Standing    General Comments        Pertinent Vitals/Pain Pain Assessment: 0-10 Pain Score: 2  Pain Location: R hip Pain Descriptors / Indicators: Sore;Discomfort Pain Intervention(s): Monitored during session;Premedicated before session;Repositioned    Home Living                      Prior Function            PT Goals (current goals can now be found in the care plan section) Acute Rehab PT Goals Patient Stated Goal: to go home  Progress towards PT goals: Progressing toward goals    Frequency    7X/week      PT Plan Current plan remains appropriate    Co-evaluation  AM-PAC PT "6 Clicks" Daily Activity  Outcome Measure  Difficulty turning over in bed (including adjusting bedclothes, sheets and blankets)?: Total Difficulty moving from lying on back to sitting on the side of the bed? : Total Difficulty sitting down on and standing up from a chair with arms (e.g., wheelchair, bedside commode, etc,.)?: A Little Help needed moving to and from a bed to chair (including a wheelchair)?: A Little Help needed walking in hospital room?: A Little Help needed climbing 3-5 steps with a railing? : A Little 6 Click Score: 14    End of Session  Equipment Utilized During Treatment: Gait belt Activity Tolerance: Patient tolerated treatment well Patient left: in chair;with call bell/phone within reach;with family/visitor present Nurse Communication: Mobility status PT Visit Diagnosis: Other abnormalities of gait and mobility (R26.89);Pain Pain - Right/Left: Right Pain - part of body: Hip     Time: 1436-1500 PT Time Calculation (min) (ACUTE ONLY): 24 min  Charges:  $Gait Training: 8-22 mins $Therapeutic Exercise: 8-22 mins                    G Codes:       Earney Navy, PTA Pager: [phone]     Darliss Cheney 11/07/2016, 3:51 PM

## 2016-11-07 NOTE — Progress Notes (Signed)
   PATIENT ID: Steven [name]   1 Day Post-Op Procedure(s) (LRB): TOTAL HIP REVISION OF THE ACETABULAR COMPONENT (Right)  Subjective: Doing well, no pain overnight. Eating this am and sitting up in bed.   Objective:  Vitals:   11/06/16 2019 11/07/16 0538  BP: (!) 130/57 (!) 97/48  Pulse: 68 62  Resp:    Temp: 97.7 F (36.5 C) 98.5 F (36.9 C)     R hip dressing c/d/i Wiggles toes, distally NVI Calves soft, nontender  Labs:   Recent Labs  11/07/16 0456  HGB 9.3*   Recent Labs  11/07/16 0456  WBC 11.8*  RBC 3.22*  HCT 28.5*  PLT 230  No results for input(s): NA, K, CL, CO2, BUN, CREATININE, GLUCOSE, CALCIUM in the last 72 hours.  Assessment and Plan: 1 day s/p right hip revision ABLA- 9.3, expected and asymptomatic, will continue to monitor Up with PT today, OT consulted per PT rec D/c home today if cleared by PT/pain controlled, if not plan to d/c home tomorrow   VTE proph: Eliquis, SCDs

## 2016-11-07 NOTE — Evaluation (Signed)
Occupational Therapy Evaluation Patient Details Name: Steven [name] MRN: [id number] DOB: 1934/10/14 Today's Date: 11/07/2016    History of Present Illness Pt is a 82 y/o male s/p R total hip revision with posterior hip precautions. PMH includes ankylosising spondylitis, anxiety, DM, depression, heart murmur, R THA, and L rotator cuff repair.    Clinical Impression   Pt reports he was independent with ADL PTA. Currently pt overall min assist for functional mobility, min guard for seated UB ADL, and max assist for LB ADL. Pt able to recall 1/3 posterior hip precautions; reviewed all with pt. Pt planning to d/c home with supervision from family. Pt would benefit from continued skilled OT to address established goals.    Follow Up Recommendations  DC plan and follow up therapy as arranged by surgeon;Supervision/Assistance - 24 hour    Equipment Recommendations  None recommended by OT    Recommendations for Other Services       Precautions / Restrictions Precautions Precautions: Posterior Hip Precaution Booklet Issued: No Precaution Comments: Pt able to recall 1/3 posterior hip precautions; reviewed all with pt Restrictions Weight Bearing Restrictions: Yes RLE Weight Bearing: Weight bearing as tolerated      Mobility Bed Mobility Overal bed mobility: Needs Assistance Bed Mobility: Supine to Sit     Supine to sit: Supervision     General bed mobility comments: Increased time, HOB elevated, with use of bed rails. Able to maintain precautions throughout  Transfers Overall transfer level: Needs assistance Equipment used: Rolling walker (2 wheeled) Transfers: Sit to/from Stand Sit to Stand: Min assist         General transfer comment: Min assist for balance in standing. Pt with posterior lean initially and unsteady.    Balance Overall balance assessment: Needs assistance Sitting-balance support: Feet supported Sitting balance-[name]: Good     Standing balance  support: Bilateral upper extremity supported Standing balance-[name]: Poor Standing balance comment: RW for support                           ADL either performed or assessed with clinical judgement   ADL Overall ADL's : Needs assistance/impaired Eating/Feeding: Set up;Sitting   Grooming: Set up;Sitting   Upper Body Bathing: Min guard;Sitting   Lower Body Bathing: Maximal assistance;Sit to/from stand   Upper Body Dressing : Min guard;Sitting   Lower Body Dressing: Maximal assistance;Sit to/from stand Lower Body Dressing Details (indicate cue type and reason): Reports wife can assist with LB ADL as needed Toilet Transfer: Minimal assistance;Ambulation;RW Toilet Transfer Details (indicate cue type and reason): Simulated by short distance ambulation from EOB to chair         Functional mobility during ADLs: Minimal assistance;Rolling walker General ADL Comments: Educated pt on posterior hip precautions during functional activities.     Vision         Perception     Praxis      Pertinent Vitals/Pain Pain Assessment: 0-10 Pain Score: 1  Pain Location: R hip Pain Descriptors / Indicators: Sore Pain Intervention(s): Monitored during session;Repositioned;Ice applied     Hand Dominance Right   Extremity/Trunk Assessment Upper Extremity Assessment Upper Extremity Assessment: Generalized weakness   Lower Extremity Assessment Lower Extremity Assessment: Defer to PT evaluation   Cervical / Trunk Assessment Cervical / Trunk Assessment: Normal   Communication Communication Communication: No difficulties;HOH   Cognition Arousal/Alertness: Awake/alert Behavior During Therapy: WFL for tasks assessed/performed Overall Cognitive Status: Within Functional Limits for tasks  assessed                                     General Comments       Exercises     Shoulder Instructions      Home Living Family/patient expects to be discharged  to:: Private residence Living Arrangements: Spouse/significant other Available Help at Discharge: Family;Available 24 hours/day Type of Home: House Home Access: Stairs to enter [employer] of Steps: 5 Entrance Stairs-Rails: Right;Left Home Layout: Two level;Able to live on main level with bedroom/bathroom Alternate Level Stairs-Number of Steps: 13 Alternate Level Stairs-Rails: Right Bathroom Shower/Tub: Occupational psychologist: Standard     Home Equipment: Bedside commode;Walker - 2 wheels;Cane - single point;Shower seat - built in          Prior Functioning/Environment Level of Independence: Independent with assistive device(s)        Comments: Used cane at baseline         OT Problem List: Decreased strength;Decreased range of motion;Decreased activity tolerance;Impaired balance (sitting and/or standing);Decreased knowledge of use of DME or AE;Decreased knowledge of precautions;Pain      OT Treatment/Interventions: Self-care/ADL training;Energy conservation;DME and/or AE instruction;Therapeutic activities;Patient/family education;Balance training    OT Goals(Current goals can be found in the care plan section) Acute Rehab OT Goals Patient Stated Goal: to go home  OT Goal Formulation: With patient Time For Goal Achievement: 11/21/16 Potential to Achieve Goals: Good ADL Goals Pt Will Perform Lower Body Bathing: with supervision;sit to/from stand;with adaptive equipment Pt Will Perform Lower Body Dressing: with supervision;with adaptive equipment;sit to/from stand Pt Will Transfer to Toilet: with supervision;ambulating;bedside commode (over toilet) Pt Will Perform Toileting - Clothing Manipulation and hygiene: with supervision;sit to/from stand Pt Will Perform Tub/Shower Transfer: with supervision;ambulating;Shower transfer;shower seat;rolling walker  OT Frequency: Min 2X/week   Barriers to D/C:            Co-evaluation               AM-PAC PT "6 Clicks" Daily Activity     Outcome Measure Help from another person eating meals?: None Help from another person taking care of personal grooming?: A Little Help from another person toileting, which includes using toliet, bedpan, or urinal?: A Little Help from another person bathing (including washing, rinsing, drying)?: A Lot Help from another person to put on and taking off regular upper body clothing?: A Little Help from another person to put on and taking off regular lower body clothing?: A Lot 6 Click Score: 17   End of Session Equipment Utilized During Treatment: Rolling walker  Activity Tolerance: Patient tolerated treatment well Patient left: in chair;with call bell/phone within reach  OT Visit Diagnosis: Unsteadiness on feet (R26.81);Other abnormalities of gait and mobility (R26.89)                Time: 4967-5916 OT Time Calculation (min): 12 min Charges:  OT General Charges $OT Visit: 1 Procedure OT Evaluation $OT Eval Moderate Complexity: 1 Procedure G-Codes:     Deeric Cruise A. Ulice Brilliant, M.S., OTR/L Pager: [place] 11/07/2016, 9:32 AM

## 2016-11-07 NOTE — Progress Notes (Signed)
Physical Therapy Treatment Patient Details Name: Steven [name] MRN: [id number] DOB: 1934/05/16 Today's Date: 11/07/2016    History of Present Illness Pt is a 82 y/o male s/p R total hip revision with posterior hip precautions. PMH includes ankylosising spondylitis, anxiety, DM, depression, heart murmur, R THA, and L rotator cuff repair.     PT Comments    Patient is making progress toward mobility goals. Pt tolerated gait and stair training well. Overall min guard assist throughout session. Review HEP next session. Current plan remains appropriate.    Follow Up Recommendations  DC plan and follow up therapy as arranged by surgeon;Supervision/Assistance - 24 hour     Equipment Recommendations  None recommended by PT    Recommendations for Other Services       Precautions / Restrictions Precautions Precautions: Posterior Hip Precaution Booklet Issued: Yes (comment) Precaution Comments: pt able to recall 3/3 precautions  Restrictions Weight Bearing Restrictions: Yes RLE Weight Bearing: Weight bearing as tolerated    Mobility  Bed Mobility Overal bed mobility: Needs Assistance Bed Mobility: Supine to Sit     Supine to sit: Supervision     General bed mobility comments: pt OOB in chair upon arrival  Transfers Overall transfer level: Needs assistance Equipment used: Rolling walker (2 wheeled) Transfers: Sit to/from Stand Sit to Stand: Min guard         General transfer comment: min guard for safety; pt able to maintain posterior precautions  Ambulation/Gait Ambulation/Gait assistance: Min guard Ambulation Distance (Feet): 200 Feet Assistive device: Rolling walker (2 wheeled) Gait Pattern/deviations: Step-through pattern;Decreased weight shift to right;Decreased stride length;Trunk flexed;Narrow base of support     General Gait Details: cues for posture and maintaining precautions when turning   Stairs Stairs: Yes   Stair Management: One rail Right;Step to  pattern;Sideways Number of Stairs: 10 General stair comments: cues for sequencing and technique; min guard for safety; no physical assist needed  Wheelchair Mobility    Modified Rankin (Stroke Patients Only)       Balance Overall balance assessment: Needs assistance Sitting-balance support: Feet supported Sitting balance-[name]: Good     Standing balance support: Bilateral upper extremity supported Standing balance-[name]: Poor Standing balance comment: RW for support                            Cognition Arousal/Alertness: Awake/alert Behavior During Therapy: WFL for tasks assessed/performed Overall Cognitive Status: Within Functional Limits for tasks assessed                                        Exercises      General Comments        Pertinent Vitals/Pain Pain Assessment: Faces Pain Score: 1  Faces Pain Scale: Hurts little more Pain Location: R hip Pain Descriptors / Indicators: Sore;Discomfort Pain Intervention(s): Monitored during session;Repositioned    Home Living Family/patient expects to be discharged to:: Private residence Living Arrangements: Spouse/significant other Available Help at Discharge: Family;Available 24 hours/day Type of Home: House Home Access: Stairs to enter Entrance Stairs-Rails: Right;Left Home Layout: Two level;Able to live on main level with bedroom/bathroom Home Equipment: Bedside commode;Walker - 2 wheels;Cane - single point;Shower seat - built in      Prior Function Level of Independence: Independent with assistive device(s)      Comments: Used cane at baseline  PT Goals (current goals can now be found in the care plan section) Acute Rehab PT Goals Patient Stated Goal: to go home  Progress towards PT goals: Progressing toward goals    Frequency    7X/week      PT Plan Current plan remains appropriate    Co-evaluation              AM-PAC PT "6 Clicks" Daily  Activity  Outcome Measure  Difficulty turning over in bed (including adjusting bedclothes, sheets and blankets)?: Total Difficulty moving from lying on back to sitting on the side of the bed? : Total Difficulty sitting down on and standing up from a chair with arms (e.g., wheelchair, bedside commode, etc,.)?: A Lot Help needed moving to and from a bed to chair (including a wheelchair)?: A Little Help needed walking in hospital room?: A Little Help needed climbing 3-5 steps with a railing? : A Little 6 Click Score: 13    End of Session Equipment Utilized During Treatment: Gait belt Activity Tolerance: Patient tolerated treatment well Patient left: in chair;with call bell/phone within reach Nurse Communication: Mobility status PT Visit Diagnosis: Other abnormalities of gait and mobility (R26.89);Pain Pain - Right/Left: Right Pain - part of body: Hip     Time: 9485-4627 PT Time Calculation (min) (ACUTE ONLY): 23 min  Charges:  $Gait Training: 23-37 mins                    G Codes:       Earney Navy, PTA Pager: [phone]     Darliss Cheney 11/07/2016, 11:38 AM

## 2016-11-08 LAB — CBC
HEMATOCRIT: 28.7 % — AB (ref 39.0–52.0)
Hemoglobin: 9.3 g/dL — ABNORMAL LOW (ref 13.0–17.0)
MCH: 28.5 pg (ref 26.0–34.0)
MCHC: 32.4 g/dL (ref 30.0–36.0)
MCV: 88 fL (ref 78.0–100.0)
PLATELETS: 218 10*3/uL (ref 150–400)
RBC: 3.26 MIL/uL — ABNORMAL LOW (ref 4.22–5.81)
RDW: 20.8 % — AB (ref 11.5–15.5)
WBC: 11 10*3/uL — ABNORMAL HIGH (ref 4.0–10.5)

## 2016-11-08 NOTE — Progress Notes (Signed)
Occupational Therapy Treatment Patient Details Name: Steven [name] MRN: [id number] DOB: 09/29/1934 Today's Date: 11/08/2016    History of present illness Pt is a 82 y/o male s/p R total hip revision with posterior hip precautions. PMH includes ankylosising spondylitis, anxiety, DM, depression, heart murmur, R THA, and L rotator cuff repair.    OT comments  Pt with significant improvement in mobility from session yesterday. Pt able to perform toilet transfer with supervision. Continues to require max assist for LB dressing due to posterior hip precautions; wife to assist as needed. Pt able to recall 2/3 precautions today. D/c plan remains appropriate. Will continue to follow acutely.   Follow Up Recommendations  DC plan and follow up therapy as arranged by surgeon;Supervision/Assistance - 24 hour    Equipment Recommendations  None recommended by OT    Recommendations for Other Services      Precautions / Restrictions Precautions Precautions: Posterior Hip Precaution Booklet Issued: No Precaution Comments: Pt able to recall 2/3 precautions. Reviewed all. Restrictions Weight Bearing Restrictions: Yes RLE Weight Bearing: Weight bearing as tolerated       Mobility Bed Mobility Overal bed mobility: Needs Assistance Bed Mobility: Supine to Sit     Supine to sit: Supervision     General bed mobility comments: no physical assist required  Transfers Overall transfer level: Needs assistance Equipment used: Rolling walker (2 wheeled) Transfers: Sit to/from Stand Sit to Stand: Supervision         General transfer comment: for safety, no assist required    Balance Overall balance assessment: Needs assistance Sitting-balance support: Feet supported;No upper extremity supported Sitting balance-[name]: Good     Standing balance support: No upper extremity supported;During functional activity Standing balance-[name]: Fair Standing balance comment: Able to static  stand without UE support                           ADL either performed or assessed with clinical judgement   ADL Overall ADL's : Needs assistance/impaired     Grooming: Standing;Supervision/safety               Lower Body Dressing: Maximal assistance Lower Body Dressing Details (indicate cue type and reason): Wife to help as needed Toilet Transfer: Supervision/safety;Ambulation;BSC;RW         Tub/Shower Transfer Details (indicate cue type and reason): Pt reports he has a zero entry shower with a seat; anticipate no difficulties with managing. Functional mobility during ADLs: Supervision/safety;Rolling walker       Vision       Perception     Praxis      Cognition Arousal/Alertness: Awake/alert Behavior During Therapy: WFL for tasks assessed/performed Overall Cognitive Status: Within Functional Limits for tasks assessed                                          Exercises     Shoulder Instructions       General Comments      Pertinent Vitals/ Pain       Pain Assessment: Faces Faces Pain Scale: Hurts a little bit Pain Location: R hip Pain Descriptors / Indicators: Sore Pain Intervention(s): Monitored during session;Repositioned;Ice applied  Home Living  Prior Functioning/Environment              Frequency  Min 2X/week        Progress Toward Goals  OT Goals(current goals can now be found in the care plan section)  Progress towards OT goals: Progressing toward goals  Acute Rehab OT Goals Patient Stated Goal: to go home  OT Goal Formulation: With patient  Plan Discharge plan remains appropriate    Co-evaluation                 AM-PAC PT "6 Clicks" Daily Activity     Outcome Measure   Help from another person eating meals?: None Help from another person taking care of personal grooming?: A Little Help from another person toileting, which  includes using toliet, bedpan, or urinal?: A Little Help from another person bathing (including washing, rinsing, drying)?: A Lot Help from another person to put on and taking off regular upper body clothing?: A Little Help from another person to put on and taking off regular lower body clothing?: A Lot 6 Click Score: 17    End of Session Equipment Utilized During Treatment: Rolling walker  OT Visit Diagnosis: Unsteadiness on feet (R26.81);Other abnormalities of gait and mobility (R26.89)   Activity Tolerance Patient tolerated treatment well   Patient Left in chair;with call bell/phone within reach;Other (comment) (hip abduction pillow applied)   Nurse Communication          Time: 9432-7614 OT Time Calculation (min): 15 min  Charges: OT General Charges $OT Visit: 1 Procedure OT Treatments $Self Care/Home Management : 8-22 mins  Stafford [place] A. Ulice Brilliant, M.S., OTR/L Pager: [place] 11/08/2016, 10:29 AM

## 2016-11-08 NOTE — Care Management Note (Signed)
Case Management Note  Patient Details  Name: Steven [name] MRN: [id number] Date of Birth: 02-20-1935  Subjective/Objective:  82 y.o. M who tells me he has no HH needs nor DME.                   Action/Plan: CM will sign off for now but will be available should additional discharge needs arise or disposition change.    Expected Discharge Date:  11/08/16               Expected Discharge Plan:     In-House Referral:     Discharge planning Services  CM Consult  Post Acute Care Choice:  NA Choice offered to:  Patient  DME Arranged:    DME Agency:     HH Arranged:    [place] Agency:     Status of Service:  Completed, signed off  If discussed at [employer] of Stay Meetings, dates discussed:    Additional Comments:  Delrae Sawyers, RN 11/08/2016, 9:27 AM

## 2016-11-08 NOTE — Discharge Summary (Signed)
Patient ID: RIGGIN CUTTINO MRN: [id number] DOB/AGE: May 13, 1934 82 y.o.  Admit date: 11/06/2016 Discharge date: 11/08/2016  Admission Diagnoses:  Active Problems:   Failed arthroplasty [facility])   Discharge Diagnoses:  Same  Past Medical History:  Diagnosis Date  . Allergy    enviromental  . Anemia 2017  . Anxiety   . Arthritis    ankylosing spodilitis  . Blood transfusion without reported diagnosis    during surgery  . Cataract   . Depression   . Diabetes ([place]) 2017   Manages with diet  . Dyspnea    with exertion - had Echo done 09/29/16  . Heart murmur    states he's never had any problems  . History of kidney stones   . Hyperlipidemia   . Intestinal obstruction ([place])   . Mental disorder   . Pneumonia    as a child    Surgeries: Procedure(s): TOTAL HIP REVISION OF THE ACETABULAR COMPONENT on 11/06/2016   Consultants:   Discharged Condition: Improved  Hospital Course: DEAGAN SEVIN is an 82 y.o. male who was admitted 11/06/2016 for operative treatment of<principal problem not specified>. Patient has severe unremitting pain that affects sleep, daily activities, and work/hobbies. After pre-op clearance the patient was taken to the operating room on 11/06/2016 and underwent  Procedure(s): TOTAL HIP REVISION OF THE ACETABULAR COMPONENT.    Patient was given perioperative antibiotics: Anti-infectives    Start     Dose/Rate Route Frequency Ordered Stop   11/06/16 0700  ceFAZolin (ANCEF) IVPB 2g/100 mL premix     2 g 200 mL/hr over 30 Minutes Intravenous To [facility] 11/05/16 1305 11/06/16 1020       Patient was given sequential compression devices, early ambulation, and chemoprophylaxis to prevent DVT.  Patient benefited maximally from hospital stay and there were no complications.    Recent vital signs: Patient Vitals for the past 24 hrs:  BP Temp Temp src Pulse SpO2  11/08/16 0647 (!) 125/59 97.9 F (36.6 C) Oral 62 99 %  11/07/16 2205 (!) 123/55 98.8 F  (37.1 C) Oral 70 100 %  11/07/16 1706 (!) 106/48 98.4 F (36.9 C) Oral 62 100 %     Recent laboratory studies:  Recent Labs  11/07/16 0456 11/08/16 0351  WBC 11.8* 11.0*  HGB 9.3* 9.3*  HCT 28.5* 28.7*  PLT 230 218     Discharge Medications:   Allergies as of 11/08/2016      Reactions   Gluten Meal    GLUTEN ALLERGY/INTOLERANCE   Indomethacin Hives   Lactose Intolerance (gi)    GI UPSET      Medication List    STOP taking these medications   acetaminophen 500 MG tablet Commonly known as:  TYLENOL   meloxicam 7.5 MG tablet Commonly known as:  MOBIC     TAKE these medications   apixaban 2.5 MG Tabs tablet Commonly known as:  ELIQUIS Take 1 tablet (2.5 mg total) by mouth 2 (two) times daily.   Biotin 5 MG Tabs Take 5 mg by mouth daily.   Calcium Carbonate-Vitamin D 600-400 MG-UNIT tablet Take 1 tablet by mouth 2 (two) times daily.   cholecalciferol 1000 units tablet Commonly known as:  VITAMIN D Take 1,000 Units by mouth daily.   citalopram 40 MG tablet Commonly known as:  CELEXA Take 40 mg by mouth daily.   folic acid 1 MG tablet Commonly known as:  FOLVITE Take 1 mg by mouth daily.   FUSION PLUS  Caps Take 1 tablet by mouth daily at 3 pm.   ketorolac 0.5 % ophthalmic solution Commonly known as:  ACULAR Place 1 drop into the right eye 4 (four) times daily.   methotrexate 2.5 MG tablet Commonly known as:  RHEUMATREX Take 20 mg by mouth every Sunday. Take 8 tablets on Sunday mornings.   oxyCODONE-acetaminophen 5-325 MG tablet Commonly known as:  ROXICET Take 1 tablet by mouth every 4 (four) hours as needed.   PRESERVISION AREDS 2 PO Take 1 tablet by mouth 2 (two) times daily.   simvastatin 5 MG tablet Commonly known as:  ZOCOR Take 10 mg by mouth at bedtime.   SOOTHE XP Soln Place 1 drop into both eyes 2 (two) times daily.   tiZANidine 2 MG tablet Commonly known as:  ZANAFLEX Take 1 tablet (2 mg total) by mouth every 6 (six) hours as  needed for muscle spasms.            Durable Medical Equipment        Start     Ordered   11/06/16 1540  DME Walker rolling  Once    Question:  Patient needs a walker to treat with the following condition  Answer:  S/P revision of total hip   11/06/16 1539   11/06/16 1540  DME 3 n 1  Once     07 /27/18 1539   11/06/16 1540  DME Bedside commode  Once    Question:  Patient needs a bedside commode to treat with the following condition  Answer:  S/P revision of total hip   11/06/16 1539      Diagnostic Studies: Mr Cervical Spine Wo Contrast  Result Date: 10/15/2016 CLINICAL DATA:  Ankylosing spondylitis, research study. EXAM: MRI TOTAL SPINE WITHOUT CONTRAST TECHNIQUE: Multisequence MR imaging of the spine from the cervical spine through the sacrum was performed without IV contrast administration for evaluation. Research study [id number]. COMPARISON:  Cervical and thoracic spine radiograph October 05, 2016 FINDINGS: Segmentation: Standard. Alignment:  Maintained. Vertebrae: Vertebral bodies intact. T11-12 interbody auto arthrodesis. Remaining disc heights preserved, multilevel bone marrow signal within the discs compatible with ossification. No suspicious or acute bone marrow signal. Bilateral sacroiliac ankylosis. Conus medullaris: Extends to the T12-L1 level and appears normal. No abnormal cord signal though not tailored for evaluation. Paraspinal and other soft tissues: Low signal within the anterior longitudinal ligament compatible with syndesmophytes and ankylosing spondylitis. Susceptibility artifact RIGHT gluteal subcutaneous fat. Advanced paraspinal muscle atrophy. Disc levels: No significant canal stenosis at any level. Mild L5-S1 neural foraminal narrowing. IMPRESSION: 1. Limited research examination: Ankylosing spondylitis without acute fracture or malalignment. Electronically Signed   By: Elon Alas M.D.   On: 10/15/2016 16:23   Mr Thoracic Spine Wo Contrast  Result Date:  10/15/2016 CLINICAL DATA:  Ankylosing spondylitis, research study. EXAM: MRI TOTAL SPINE WITHOUT CONTRAST TECHNIQUE: Multisequence MR imaging of the spine from the cervical spine through the sacrum was performed without IV contrast administration for evaluation. Research study [id number]. COMPARISON:  Cervical and thoracic spine radiograph October 05, 2016 FINDINGS: Segmentation: Standard. Alignment:  Maintained. Vertebrae: Vertebral bodies intact. T11-12 interbody auto arthrodesis. Remaining disc heights preserved, multilevel bone marrow signal within the discs compatible with ossification. No suspicious or acute bone marrow signal. Bilateral sacroiliac ankylosis. Conus medullaris: Extends to the T12-L1 level and appears normal. No abnormal cord signal though not tailored for evaluation. Paraspinal and other soft tissues: Low signal within the anterior longitudinal ligament compatible with syndesmophytes and ankylosing spondylitis. Susceptibility  artifact RIGHT gluteal subcutaneous fat. Advanced paraspinal muscle atrophy. Disc levels: No significant canal stenosis at any level. Mild L5-S1 neural foraminal narrowing. IMPRESSION: 1. Limited research examination: Ankylosing spondylitis without acute fracture or malalignment. Electronically Signed   By: Elon Alas M.D.   On: 10/15/2016 16:23   Mr Lumbar Spine Wo Contrast  Result Date: 10/15/2016 CLINICAL DATA:  Ankylosing spondylitis, research study. EXAM: MRI TOTAL SPINE WITHOUT CONTRAST TECHNIQUE: Multisequence MR imaging of the spine from the cervical spine through the sacrum was performed without IV contrast administration for evaluation. Research study [id number]. COMPARISON:  Cervical and thoracic spine radiograph October 05, 2016 FINDINGS: Segmentation: Standard. Alignment:  Maintained. Vertebrae: Vertebral bodies intact. T11-12 interbody auto arthrodesis. Remaining disc heights preserved, multilevel bone marrow signal within the discs compatible with  ossification. No suspicious or acute bone marrow signal. Bilateral sacroiliac ankylosis. Conus medullaris: Extends to the T12-L1 level and appears normal. No abnormal cord signal though not tailored for evaluation. Paraspinal and other soft tissues: Low signal within the anterior longitudinal ligament compatible with syndesmophytes and ankylosing spondylitis. Susceptibility artifact RIGHT gluteal subcutaneous fat. Advanced paraspinal muscle atrophy. Disc levels: No significant canal stenosis at any level. Mild L5-S1 neural foraminal narrowing. IMPRESSION: 1. Limited research examination: Ankylosing spondylitis without acute fracture or malalignment. Electronically Signed   By: Elon Alas M.D.   On: 10/15/2016 16:23   Dg Pelvis Portable  Result Date: 11/06/2016 CLINICAL DATA:  S/P RIGHT HIP REPLACEMENT EXAM: PORTABLE PELVIS 1-2 VIEWS COMPARISON:  CT 09/10/2016 FINDINGS: Interval revision of right hip arthroplasty components, which project in expected location. No fracture or dislocation although the distal tip of the femoral component is not visualized. Mild narrowing of the articular cartilage in the left hip with marginal spurring as before. Vasectomy clips. Fusion across bilateral sacroiliac joints as before. IMPRESSION: 1. Right hip arthroplasty revision without apparent complication Electronically Signed   By: Lucrezia Europe M.D.   On: 11/06/2016 14:30   Mr Sacrum Si Joints Wo Contrast  Result Date: 10/15/2016 CLINICAL DATA:  Ankylosing spondylitis, research study. EXAM: MRI TOTAL SPINE WITHOUT CONTRAST TECHNIQUE: Multisequence MR imaging of the spine from the cervical spine through the sacrum was performed without IV contrast administration for evaluation. Research study [id number]. COMPARISON:  Cervical and thoracic spine radiograph October 05, 2016 FINDINGS: Segmentation: Standard. Alignment:  Maintained. Vertebrae: Vertebral bodies intact. T11-12 interbody auto arthrodesis. Remaining disc heights  preserved, multilevel bone marrow signal within the discs compatible with ossification. No suspicious or acute bone marrow signal. Bilateral sacroiliac ankylosis. Conus medullaris: Extends to the T12-L1 level and appears normal. No abnormal cord signal though not tailored for evaluation. Paraspinal and other soft tissues: Low signal within the anterior longitudinal ligament compatible with syndesmophytes and ankylosing spondylitis. Susceptibility artifact RIGHT gluteal subcutaneous fat. Advanced paraspinal muscle atrophy. Disc levels: No significant canal stenosis at any level. Mild L5-S1 neural foraminal narrowing. IMPRESSION: 1. Limited research examination: Ankylosing spondylitis without acute fracture or malalignment. Electronically Signed   By: Elon Alas M.D.   On: 10/15/2016 16:23    Disposition: 01-Home or Self Care  Discharge Instructions    Call MD / Call 911    Complete by:  As directed    If you experience chest pain or shortness of breath, CALL 911 and be transported to the hospital emergency room.  If you develope a fever above 101 F, pus (white drainage) or increased drainage or redness at the wound, or calf pain, call your surgeon's office.   Constipation Prevention  Complete by:  As directed    Drink plenty of fluids.  Prune juice may be helpful.  You may use a stool softener, such as Colace (over the counter) 100 mg twice a day.  Use MiraLax (over the counter) for constipation as needed.   Diet - low sodium heart healthy    Complete by:  As directed    Increase activity slowly as tolerated    Complete by:  As directed       Follow-up Information    [name], MD In 2 weeks.   Specialty:  Orthopedic Surgery Contact information: [address] [phone]            Signed: Grier Mitts 11/08/2016, 8:49 AM

## 2016-11-08 NOTE — Progress Notes (Signed)
Physical Therapy Treatment Patient Details Name: Steven [name] MRN: [id number] DOB: 01/16/1935 Today's Date: 11/08/2016    History of Present Illness Pt is a 82 y/o male s/p R total hip revision with posterior hip precautions. PMH includes ankylosising spondylitis, anxiety, DM, depression, heart murmur, R THA, and L rotator cuff repair.     PT Comments    Pt presents with improved tolerance for gait distance and improved gait speed this session. Pt continues to have mild antalgic gait noted, but only reports pain at 2/10. Pt continues to benefit from therapy follow-up in order to improve strength and mobility at discharge.     Follow Up Recommendations  DC plan and follow up therapy as arranged by surgeon;Supervision/Assistance - 24 hour     Equipment Recommendations  None recommended by PT    Recommendations for Other Services       Precautions / Restrictions Precautions Precautions: Posterior Hip Precaution Booklet Issued: Yes (comment) Precaution Comments: Pt able to recall 3/3 with minimal cueing Restrictions Weight Bearing Restrictions: Yes RLE Weight Bearing: Weight bearing as tolerated    Mobility  Bed Mobility Overal bed mobility: Needs Assistance Bed Mobility: Supine to Sit     Supine to sit: Supervision     General bed mobility comments: sitting up in recliner when PT arrives  Transfers Overall transfer level: Needs assistance Equipment used: Rolling walker (2 wheeled) Transfers: Sit to/from Stand Sit to Stand: Supervision         General transfer comment: for safety, no assist required  Ambulation/Gait Ambulation/Gait assistance: Supervision Ambulation Distance (Feet): 1000 Feet Assistive device: Rolling walker (2 wheeled) Gait Pattern/deviations: Step-through pattern;Decreased weight shift to right;Decreased stride length;Trunk flexed;Narrow base of support Gait velocity: approaching normal Gait velocity interpretation: at or above normal speed  for age/gender General Gait Details: cues for posture; improved step length symmetry and cadence   Stairs            Wheelchair Mobility    Modified Rankin (Stroke Patients Only)       Balance Overall balance assessment: Needs assistance Sitting-balance support: Feet supported;No upper extremity supported Sitting balance-[name]: Good     Standing balance support: No upper extremity supported;During functional activity Standing balance-[name]: Fair Standing balance comment: Able to static stand without UE support                            Cognition Arousal/Alertness: Awake/alert Behavior During Therapy: WFL for tasks assessed/performed Overall Cognitive Status: Within Functional Limits for tasks assessed                                        Exercises Total Joint Exercises Hip ABduction/ADduction: AROM;Right;10 reps;Standing Knee Flexion: AROM;Right;10 reps;Standing Marching in Standing: AROM;Right;10 reps;Standing Standing Hip Extension: AROM;Right;10 reps;Standing    General Comments        Pertinent Vitals/Pain Pain Assessment: 0-10 Pain Score: 2  Faces Pain Scale: Hurts a little bit Pain Location: R hip Pain Descriptors / Indicators: Sore Pain Intervention(s): Monitored during session;Premedicated before session;Repositioned;Ice applied    Home Living                      Prior Function            PT Goals (current goals can now be found in the care plan section) Acute Rehab  PT Goals Patient Stated Goal: to go home  Progress towards PT goals: Progressing toward goals    Frequency    7X/week      PT Plan Current plan remains appropriate    Co-evaluation              AM-PAC PT "6 Clicks" Daily Activity  Outcome Measure  Difficulty turning over in bed (including adjusting bedclothes, sheets and blankets)?: None Difficulty moving from lying on back to sitting on the side of the bed?  : None Difficulty sitting down on and standing up from a chair with arms (e.g., wheelchair, bedside commode, etc,.)?: A Little Help needed moving to and from a bed to chair (including a wheelchair)?: A Little Help needed walking in hospital room?: A Little Help needed climbing 3-5 steps with a railing? : A Little 6 Click Score: 20    End of Session Equipment Utilized During Treatment: Gait belt Activity Tolerance: Patient tolerated treatment well Patient left: in chair;with call bell/phone within reach Nurse Communication: Mobility status PT Visit Diagnosis: Other abnormalities of gait and mobility (R26.89);Pain Pain - Right/Left: Right Pain - part of body: Hip     Time: 0076-2263 PT Time Calculation (min) (ACUTE ONLY): 15 min  Charges:  $Gait Training: 8-22 mins                    G Codes:       Scheryl Marten PT, DPT  [phone]    Jacqulyn Liner Sloan Leiter 11/08/2016, 12:57 PM

## 2016-11-08 NOTE — Progress Notes (Signed)
   PATIENT ID: Steven [name]   2 Days Post-Op Procedure(s) (LRB): TOTAL HIP REVISION OF THE ACETABULAR COMPONENT (Right)  Subjective: Reports doing well, min pain. Denies dizziness or lightheadedness. Tolerated PT well, hopes to go home today.   Objective:  Vitals:   11/07/16 2205 11/08/16 0647  BP: (!) 123/55 (!) 125/59  Pulse: 70 62  Resp:    Temp: 98.8 F (37.1 C) 97.9 F (36.6 C)     R hip dressing c/d/i Wiggles toes, distally NVI Calves soft, nontender  Labs:   Recent Labs  11/07/16 0456 11/08/16 0351  HGB 9.3* 9.3*   Recent Labs  11/07/16 0456 11/08/16 0351  WBC 11.8* 11.0*  RBC 3.22* 3.26*  HCT 28.5* 28.7*  PLT 230 218  No results for input(s): NA, K, CL, CO2, BUN, CREATININE, GLUCOSE, CALCIUM in the last 72 hours.  Assessment and Plan: 2 days s/p right hip revision ABLA- 9.3, expected and asymptomatic, hold on transfusion Up with PT today, likely will be cleared for d/c D/c home today if cleared by PT/pain controlled  VTE proph: Eliquis, SCDs

## 2016-11-09 ENCOUNTER — Encounter (HOSPITAL_COMMUNITY): Payer: Self-pay | Admitting: Orthopedic Surgery

## 2016-11-09 LAB — BPAM RBC
BLOOD PRODUCT EXPIRATION DATE: 201808142359
Blood Product Expiration Date: 201808142359
ISSUE DATE / TIME: 201807270922
ISSUE DATE / TIME: 201807270922
UNIT TYPE AND RH: 6200
Unit Type and Rh: 6200

## 2016-11-09 LAB — TYPE AND SCREEN
ABO/RH(D): A POS
ANTIBODY SCREEN: POSITIVE
DAT, IGG: NEGATIVE
DONOR AG TYPE: NEGATIVE
Donor AG Type: NEGATIVE
Unit division: 0
Unit division: 0

## 2016-11-10 DIAGNOSIS — Z9841 Cataract extraction status, right eye: Secondary | ICD-10-CM | POA: Diagnosis not present

## 2016-11-10 DIAGNOSIS — H44111 Panuveitis, right eye: Secondary | ICD-10-CM | POA: Diagnosis not present

## 2016-11-10 DIAGNOSIS — H35711 Central serous chorioretinopathy, right eye: Secondary | ICD-10-CM | POA: Diagnosis not present

## 2016-11-10 DIAGNOSIS — M452 Ankylosing spondylitis of cervical region: Secondary | ICD-10-CM | POA: Diagnosis not present

## 2016-11-10 DIAGNOSIS — H2512 Age-related nuclear cataract, left eye: Secondary | ICD-10-CM | POA: Diagnosis not present

## 2016-11-11 DIAGNOSIS — E785 Hyperlipidemia, unspecified: Secondary | ICD-10-CM | POA: Diagnosis not present

## 2016-11-11 DIAGNOSIS — J3089 Other allergic rhinitis: Secondary | ICD-10-CM | POA: Diagnosis not present

## 2016-11-11 DIAGNOSIS — N182 Chronic kidney disease, stage 2 (mild): Secondary | ICD-10-CM | POA: Diagnosis not present

## 2016-11-11 DIAGNOSIS — E291 Testicular hypofunction: Secondary | ICD-10-CM | POA: Diagnosis not present

## 2016-11-11 DIAGNOSIS — J301 Allergic rhinitis due to pollen: Secondary | ICD-10-CM | POA: Diagnosis not present

## 2016-11-11 DIAGNOSIS — J3081 Allergic rhinitis due to animal (cat) (dog) hair and dander: Secondary | ICD-10-CM | POA: Diagnosis not present

## 2016-11-11 DIAGNOSIS — Z0000 Encounter for general adult medical examination without abnormal findings: Secondary | ICD-10-CM | POA: Diagnosis not present

## 2016-11-11 DIAGNOSIS — E559 Vitamin D deficiency, unspecified: Secondary | ICD-10-CM | POA: Diagnosis not present

## 2016-11-11 DIAGNOSIS — E1122 Type 2 diabetes mellitus with diabetic chronic kidney disease: Secondary | ICD-10-CM | POA: Diagnosis not present

## 2016-11-12 ENCOUNTER — Ambulatory Visit: Payer: Medicare Other | Admitting: Gastroenterology

## 2016-11-16 DIAGNOSIS — J301 Allergic rhinitis due to pollen: Secondary | ICD-10-CM | POA: Diagnosis not present

## 2016-11-16 DIAGNOSIS — J3089 Other allergic rhinitis: Secondary | ICD-10-CM | POA: Diagnosis not present

## 2016-11-16 DIAGNOSIS — J3081 Allergic rhinitis due to animal (cat) (dog) hair and dander: Secondary | ICD-10-CM | POA: Diagnosis not present

## 2016-11-19 DIAGNOSIS — T84030A Mechanical loosening of internal right hip prosthetic joint, initial encounter: Secondary | ICD-10-CM | POA: Diagnosis not present

## 2016-11-20 ENCOUNTER — Ambulatory Visit: Payer: Medicare Other | Admitting: Physician Assistant

## 2016-11-22 ENCOUNTER — Emergency Department (HOSPITAL_COMMUNITY): Payer: Medicare Other

## 2016-11-22 ENCOUNTER — Encounter (HOSPITAL_COMMUNITY): Payer: Self-pay

## 2016-11-22 ENCOUNTER — Emergency Department (HOSPITAL_COMMUNITY)
Admission: EM | Admit: 2016-11-22 | Discharge: 2016-11-22 | Disposition: A | Discharge status: Home / Self Care | Payer: Medicare Other | Attending: Emergency Medicine | Admitting: Emergency Medicine

## 2016-11-22 DIAGNOSIS — D649 Anemia, unspecified: Secondary | ICD-10-CM | POA: Diagnosis not present

## 2016-11-22 DIAGNOSIS — Z7901 Long term (current) use of anticoagulants: Secondary | ICD-10-CM | POA: Diagnosis not present

## 2016-11-22 DIAGNOSIS — E119 Type 2 diabetes mellitus without complications: Secondary | ICD-10-CM | POA: Insufficient documentation

## 2016-11-22 DIAGNOSIS — R197 Diarrhea, unspecified: Secondary | ICD-10-CM | POA: Insufficient documentation

## 2016-11-22 DIAGNOSIS — Z79899 Other long term (current) drug therapy: Secondary | ICD-10-CM | POA: Diagnosis not present

## 2016-11-22 DIAGNOSIS — R109 Unspecified abdominal pain: Secondary | ICD-10-CM | POA: Diagnosis not present

## 2016-11-22 DIAGNOSIS — R1084 Generalized abdominal pain: Secondary | ICD-10-CM | POA: Insufficient documentation

## 2016-11-22 DIAGNOSIS — Z87891 Personal history of nicotine dependence: Secondary | ICD-10-CM | POA: Diagnosis not present

## 2016-11-22 DIAGNOSIS — E785 Hyperlipidemia, unspecified: Secondary | ICD-10-CM | POA: Insufficient documentation

## 2016-11-22 DIAGNOSIS — R112 Nausea with vomiting, unspecified: Secondary | ICD-10-CM | POA: Diagnosis not present

## 2016-11-22 LAB — CBC WITH DIFFERENTIAL/PLATELET
Basophils Absolute: 0 10*3/uL (ref 0.0–0.1)
Basophils Relative: 0 %
Eosinophils Absolute: 0.3 10*3/uL (ref 0.0–0.7)
Eosinophils Relative: 4 %
HCT: 33.7 % — ABNORMAL LOW (ref 39.0–52.0)
Hemoglobin: 11.2 g/dL — ABNORMAL LOW (ref 13.0–17.0)
Lymphocytes Relative: 17 %
Lymphs Abs: 1.2 10*3/uL (ref 0.7–4.0)
MCH: 29.8 pg (ref 26.0–34.0)
MCHC: 33.2 g/dL (ref 30.0–36.0)
MCV: 89.6 fL (ref 78.0–100.0)
Monocytes Absolute: 0.6 10*3/uL (ref 0.1–1.0)
Monocytes Relative: 8 %
Neutro Abs: 4.8 10*3/uL (ref 1.7–7.7)
Neutrophils Relative %: 71 %
Platelets: 328 10*3/uL (ref 150–400)
RBC: 3.76 MIL/uL — ABNORMAL LOW (ref 4.22–5.81)
RDW: 17.8 % — ABNORMAL HIGH (ref 11.5–15.5)
WBC: 6.9 10*3/uL (ref 4.0–10.5)

## 2016-11-22 LAB — LIPASE, BLOOD: Lipase: 44 U/L (ref 11–51)

## 2016-11-22 LAB — COMPREHENSIVE METABOLIC PANEL
ALT: 23 U/L (ref 17–63)
AST: 24 U/L (ref 15–41)
Albumin: 4 g/dL (ref 3.5–5.0)
Alkaline Phosphatase: 65 U/L (ref 38–126)
Anion gap: 11 (ref 5–15)
BUN: 13 mg/dL (ref 6–20)
CO2: 24 mmol/L (ref 22–32)
Calcium: 9.2 mg/dL (ref 8.9–10.3)
Chloride: 102 mmol/L (ref 101–111)
Creatinine, Ser: 0.81 mg/dL (ref 0.61–1.24)
GFR calc Af Amer: >60 mL/min (ref >60)
GFR calc non Af Amer: >60 mL/min (ref >60)
Glucose, Bld: 90 mg/dL (ref 65–99)
Potassium: 3.9 mmol/L (ref 3.5–5.1)
Sodium: 137 mmol/L (ref 135–145)
Total Bilirubin: 0.9 mg/dL (ref 0.3–1.2)
Total Protein: 7.5 g/dL (ref 6.5–8.1)

## 2016-11-22 LAB — URINALYSIS, ROUTINE W REFLEX MICROSCOPIC
Bilirubin Urine: NEGATIVE
Glucose, UA: NEGATIVE mg/dL
Hgb urine dipstick: NEGATIVE
Ketones, ur: 5 mg/dL — AB
Leukocytes, UA: NEGATIVE
Nitrite: NEGATIVE
Protein, ur: NEGATIVE mg/dL
Specific Gravity, Urine: 1.016 (ref 1.005–1.030)
pH: 5 (ref 5.0–8.0)

## 2016-11-22 MED ORDER — IOPAMIDOL (ISOVUE-300) INJECTION 61%
100.0000 mL | Freq: Once | INTRAVENOUS | Status: AC | PRN
  Administered 2016-11-22: 75 mL via INTRAVENOUS

## 2016-11-22 NOTE — ED Provider Notes (Signed)
Medical screening examination/treatment/procedure(s) were conducted as a shared visit with non-physician practitioner(s) and myself.  I personally evaluated the patient during the encounter.   EKG Interpretation None     82 year old male presents with recurrent abdominal discomfort. Abdominal exam is nonsurgical. Denies any fever vomiting. History of bowel obstruction was concerned this had recurred. Abdominal CT reassuring today and patient will follow-up with his doctor   Lacretia Leigh, MD 11/22/16 Steven [name]

## 2016-11-22 NOTE — ED Provider Notes (Signed)
[facility] DEPT Provider Note   CSN: [id number] Arrival date & time: 11/22/16  1411     History   Chief Complaint No chief complaint on file.   HPI Steven [name] is a 82 y.o. male with PMHx Diet controlled diabetes, nephrolithiasis, bowel obstruction, HLD who presents today with chief complaint gradual onset, progressively worsening abdominal pain with associated diarrhea and vomiting for 3 days. He states that he typically has 1-2 bowel movements every day, but did not have one on Thursday. On Friday, he notes he had 1 episode of nonbloody nonbilious emesis after having dinner. He took magnesium citrate that evening in order to try to have a bowel movement. Since then he notes multiple episodes of watery nonbloody stools. He has not had any nausea, fevers, or chills. He notes he had urinary frequency on Friday evening but this has resolved, but states he did have dysuria while in the ED today attempted to give a urine sample. He has been able to tolerate fluids without difficulty, but endorses decreased appetite. States pain is currently constant, worse with palpation and movement and is sharp and generalized pain. No chest pain or shortness of breath. He denies melena, hematochezia. Does have a history of small bowel obstruction and multiple abdominal surgeries including appendectomy, cholecystectomy, and partial colectomy. States this feels similarly to his prior bowel obstruction.  The history is provided by the patient.    Past Medical History:  Diagnosis Date  . Allergy    enviromental  . Anemia 2017  . Anxiety   . Arthritis    ankylosing spodilitis  . Blood transfusion without reported diagnosis    during surgery  . Cataract   . Depression   . Diabetes ([place]) 2017   Manages with diet  . Dyspnea    with exertion - had Echo done 09/29/16  . Heart murmur    states he's never had any problems  . History of kidney stones   . Hyperlipidemia   . Intestinal obstruction ([place])     . Mental disorder   . Pneumonia    as a child    Patient Active Problem List   Diagnosis Date Noted  . Failed arthroplasty ([place]) 11/06/2016  . 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 Dyspnea on exertion 09/11/2016  . SBO (small bowel obstruction) ([place]) 03/21/2015  . Abdominal pain 04/10/2014  . Bowel obstruction ([place]) 04/10/2014  . Hyperlipidemia 04/10/2014  . History of ankylosing spondylitis 04/10/2014    Past Surgical History:  Procedure Laterality Date  . ABDOMINAL SURGERY     had abcess  . APPENDECTOMY    . CHOLECYSTECTOMY    . COLONOSCOPY    . COLOSTOMY    . COLOSTOMY REVERSAL    . EYE SURGERY Left    scar tissue removed from cornea  . EYE SURGERY Right    cataract surgery with lens implant  . JOINT REPLACEMENT Right    hip  x 2 1999 and 2007  . SHOULDER ARTHROSCOPY WITH ROTATOR CUFF REPAIR AND SUBACROMIAL DECOMPRESSION Left 03/02/2013   Procedure: LEFT SHOULDER ARTHROSCOPY WITH SUBACROMIAL DECOMPRESSION DISTAL CALVICLE RESECTION AND ROTATOR CUFF REPAIR ;  Surgeon: [name], MD;  Location: [facility];  Service: Orthopedics;  Laterality: Left;  . TOTAL HIP REVISION Right 11/06/2016   Procedure: TOTAL HIP REVISION OF THE ACETABULAR COMPONENT;  Surgeon: [name], MD;  Location: [facility];  Service: Orthopedics;  Laterality: Right;       Home Medications  Prior to Admission medications   Medication Sig Start Date End Date Taking? Authorizing Provider  apixaban (ELIQUIS) 2.5 MG TABS tablet Take 1 tablet (2.5 mg total) by mouth 2 (two) times daily. 11/06/16   Leighton Parody, PA-C  Artificial Tear Solution (SOOTHE XP) SOLN Place 1 drop into both eyes 2 (two) times daily.    [provider]  Biotin 5 MG TABS Take 5 mg by mouth daily.    [provider]  Calcium Carbonate-Vitamin D 600-400 MG-UNIT tablet Take 1 tablet by mouth 2 (two) times daily.    [provider]  cholecalciferol (VITAMIN D) 1000 units tablet Take 1,000  Units by mouth daily.    [provider]  citalopram (CELEXA) 40 MG tablet Take 40 mg by mouth daily.    [provider]  folic acid (FOLVITE) 1 MG tablet Take 1 mg by mouth daily.     [provider]  Iron-FA-B Cmp-C-Biot-Probiotic (FUSION PLUS) CAPS Take 1 tablet by mouth daily at 3 pm.  09/14/16   [provider]  ketorolac (ACULAR) 0.5 % ophthalmic solution Place 1 drop into the right eye 4 (four) times daily. 08/28/16   [provider]  methotrexate (RHEUMATREX) 2.5 MG tablet Take 20 mg by mouth every Sunday. Take 8 tablets on Sunday mornings. 09/15/16   [provider]  Multiple Vitamins-Minerals (PRESERVISION AREDS 2 PO) Take 1 tablet by mouth 2 (two) times daily.    [provider]  oxyCODONE-acetaminophen (ROXICET) 5-325 MG tablet Take 1 tablet by mouth every 4 (four) hours as needed. 11/06/16   Leighton Parody, PA-C  simvastatin (ZOCOR) 5 MG tablet Take 10 mg by mouth at bedtime.    [provider]  tiZANidine (ZANAFLEX) 2 MG tablet Take 1 tablet (2 mg total) by mouth every 6 (six) hours as needed for muscle spasms. 11/06/16   Leighton Parody, PA-C    Family History Family History  Problem Relation Age of Onset  . Heart attack Mother   . Cancer Mother   . Diabetes Mother   . Heart attack Maternal Grandfather     Social History Social History  Substance Use Topics  . Smoking status: Former Research scientist (life sciences)  . Smokeless tobacco: Never Used  . Alcohol use Yes     Comment: 3 beers or glasses of wine a day--reports stopped ETOH 11/01/16      Allergies   Gluten meal; Indomethacin; and Lactose intolerance (gi)   Review of Systems Review of Systems  Constitutional: Negative for chills and fever.  Respiratory: Negative for shortness of breath.   Cardiovascular: Negative for chest pain.  Gastrointestinal: Positive for abdominal pain, constipation, diarrhea, nausea and vomiting. Negative for blood in stool.    Genitourinary: Positive for dysuria. Negative for flank pain and hematuria.  Musculoskeletal: Negative for back pain.  All other systems reviewed and are negative.    Physical Exam Updated Vital Signs BP (!) 121/59 (BP Location: Left Arm)   Pulse 64   Temp 97.9 F (36.6 C) (Oral)   Resp 20   Ht 5' 6.5" (1.689 m)   Wt 58.5 kg (129 lb)   SpO2 99%   BMI 20.51 kg/m   Physical Exam  Constitutional: He appears well-developed and well-nourished. No distress.  HENT:  Head: Normocephalic and atraumatic.  Eyes: Conjunctivae are normal. Right eye exhibits no discharge. Left eye exhibits no discharge.  Neck: No JVD present. No tracheal deviation present.  Cardiovascular: Normal rate and regular rhythm.  Pulmonary/Chest: Effort normal and breath sounds normal.  Abdominal: Soft. He exhibits no distension and no mass. There is tenderness. There is guarding. There is no rebound.  Hypoactive bowel sounds, generally tender to palpation with maximal tenderness to palpation in the left lower quadrant. Murphy sign absent, Rovsing sign absent, no CVA tenderness. Multiple well-healed surgical scars noted to the abdomen  Musculoskeletal: He exhibits no edema.  Neurological: He is alert.  Skin: Skin is warm and dry. No erythema.  Psychiatric: He has a normal mood and affect. His behavior is normal.  Nursing note and vitals reviewed.    ED Treatments / Results  Labs (all labs ordered are listed, but only abnormal results are displayed) Labs Reviewed  CBC WITH DIFFERENTIAL/PLATELET - Abnormal; Notable for the following:       Result Value   RBC 3.76 (*)    Hemoglobin 11.2 (*)    HCT 33.7 (*)    RDW 17.8 (*)    All other components within normal limits  URINALYSIS, ROUTINE W REFLEX MICROSCOPIC - Abnormal; Notable for the following:    Ketones, ur 5 (*)    All other components within normal limits  COMPREHENSIVE METABOLIC PANEL  LIPASE, BLOOD    EKG  EKG Interpretation None        Radiology Ct Abdomen Pelvis W Contrast  Result Date: 11/22/2016 CLINICAL DATA:  Abdominal pain with diarrhea EXAM: CT ABDOMEN AND PELVIS WITH CONTRAST TECHNIQUE: Multidetector CT imaging of the abdomen and pelvis was performed using the standard protocol following bolus administration of intravenous contrast. CONTRAST:  18mL ISOVUE-300 IOPAMIDOL (ISOVUE-300) INJECTION 61% COMPARISON:  03/21/2015 FINDINGS: Lower chest: Lung bases demonstrate mild scarring in the right middle lobe. No acute consolidation or pleural effusion is seen. The heart is nonenlarged. Hepatobiliary: No focal liver abnormality is seen. Status post cholecystectomy. No biliary dilatation. Pancreas: Unremarkable. No pancreatic ductal dilatation or surrounding inflammatory changes. Spleen: Normal in size without focal abnormality. Adrenals/Urinary Tract: Adrenal glands are within normal limits. Subcentimeter hypodensities within the kidneys too small to further characterize, not significantly change in probably representing small cysts. Multiple intrarenal stones bilaterally. Largest on the right is seen in the upper pole and measures 7 mm. Negative for hydronephrosis. Bladder within normal limits allowing for streak artifact Stomach/Bowel: Stomach nonenlarged. No bowel wall thickening. Postsurgical changes of the bowel in the left abdomen with slightly dilated segments of small bowel at the anastomosis but without convincing evidence for an obstruction. No additional dilated loops of bowel seen. Appendix not well identified. Scattered colon diverticula without acute inflammation. Vascular/Lymphatic: Moderate atherosclerosis of the aorta. No aneurysm is seen. No significantly enlarged abdominal or pelvic lymph nodes. Reproductive: Enlarged appearing prostate as before. Other: No free air or free fluid. Musculoskeletal: Status post right hip replacement. Ankylosis of the spine. IMPRESSION: 1. Postsurgical changes of the bowel in the left  hemiabdomen but without convincing evidence for bowel obstruction at this time. No bowel wall thickening is seen. 2. Intrarenal calculi bilaterally.  Negative for hydronephrosis 3. Enlarged appearing prostate 4. Bony changes consistent with ankylosing spondylitis. 5. Colon diverticular changes without acute inflammation Electronically Signed   By: Donavan Foil M.D.   On: 11/22/2016 18:39    Procedures Procedures (including critical care time)  Medications Ordered in ED Medications  iopamidol (ISOVUE-300) 61 % injection 100 mL (75 mLs Intravenous Contrast Given 11/22/16 1755)     Initial Impression / Assessment and Plan / ED Course  I have reviewed the triage vital signs and  the nursing notes.  Pertinent labs & imaging results that were available during my care of the patient were reviewed by me and considered in my medical decision making (see chart for details).     Patient with history of multiple abdominal surgeries presents with abdominal pain, diarrhea, and one episode of nausea and vomiting. Afebrile, vital signs are stable. He is generally well-appearing for his age. No leukocytosis, no significant electrolyte abnormality, and urine is not concerning for UTI or nephrolithiasis. CT of the abdomen and pelvis shows no evidence of bowel obstruction. He does have intrarenal calculi with no evidence of obstruction. Stable for discharge home with Imodium and fiber and follow up with primary care physician. Discussed indications for return to the ED. Pt verbalized understanding of and agreement with plan and is safe for discharge home at this time. Patient seen and evaluated by Dr. Zenia Resides, who agrees with assessment and plan.  Final Clinical Impressions(s) / ED Diagnoses   Final diagnoses:  Generalized abdominal pain  Nausea vomiting and diarrhea    New Prescriptions New Prescriptions   No medications on file     Debroah Baller 11/22/16 1925

## 2016-11-22 NOTE — ED Triage Notes (Signed)
Patient presents today with c/o abdominal pain and diarrhea since Friday night after taking laxative on Friday. Pt state diarrhea stop but he continue to have abdominal pain. Pt state he have hx of blockage in the past.

## 2016-11-22 NOTE — Discharge Instructions (Signed)
You may take over-the-counter Imodium as needed for diarrhea. Advance your diet slowly and avoid foods that upset your stomach. Follow-up with your primary care physician for reevaluation of your symptoms. Return to the ED immediately if any concerning signs or symptoms develop such as fevers, blood in your urine or stool, or worsening symptoms.

## 2016-11-23 DIAGNOSIS — J301 Allergic rhinitis due to pollen: Secondary | ICD-10-CM | POA: Diagnosis not present

## 2016-11-23 DIAGNOSIS — J3089 Other allergic rhinitis: Secondary | ICD-10-CM | POA: Diagnosis not present

## 2016-11-23 DIAGNOSIS — J3081 Allergic rhinitis due to animal (cat) (dog) hair and dander: Secondary | ICD-10-CM | POA: Diagnosis not present

## 2016-11-23 DIAGNOSIS — M25551 Pain in right hip: Secondary | ICD-10-CM | POA: Diagnosis not present

## 2016-11-24 ENCOUNTER — Encounter: Payer: Self-pay | Admitting: Physician Assistant

## 2016-11-24 ENCOUNTER — Ambulatory Visit (INDEPENDENT_AMBULATORY_CARE_PROVIDER_SITE_OTHER): Payer: Medicare Other | Admitting: Physician Assistant

## 2016-11-24 VITALS — BP 102/58 | HR 72 | Ht 66.0 in | Wt 131.6 lb

## 2016-11-24 DIAGNOSIS — R194 Change in bowel habit: Secondary | ICD-10-CM

## 2016-11-24 NOTE — Progress Notes (Signed)
Chief Complaint: Change in bowel habits  HPI:  Steven [name] is an 82 year old Caucasian male who returns to clinic today for follow-up of his change in bowel habits as well as follow-up after recent visit to the ER for abdominal pain.     Please recall patient was last seen in clinic by me on 09/16/16. At that time he described a change in his normal bowel movements. He had recent CT abdomen pelvis at that time which was normal as well as labs which showed anemia. He had been diagnosed with possible celiac disease and was on a gluten-free diet and his diarrhea had been improving. He was noted to be slightly anemic with a hemoglobin of 9.9 on 09/10/16 which iwas improved from a week prior.    Patient was seen in the ER and 11/22/16 with a complaint of gradual onset of progressively worsening abdominal pain associated diarrhea and vomiting for 3 days. He was concerned regarding a repeat bowel blockage. Labs at that time showed a hemoglobin of 11.2 and otherwise normal. CT of the abdomen and pelvis showed postsurgical changes of the bowel movement left hemiabdomen but without convincing evidence for bowel extraction. There was no bowel wall thickening. Intrarenal calculi bilaterally negative for hydronephrosis and enlarged appearing prostate. Bony changes consistent with ankylosing spondylitis and colon diverticular changes without acute inflammation.   Today, the patient tells me that she was having troubles as above when seen in the ER but now he has gone back to somewhat his "new normal". Patient tells me that he has been having two softer than normal stools in the morning. He typically has one after his morning coffee and then again after eating breakfast within the next 2 hours. This is the same as it was months ago when he was seen last. He has remained on a gluten-free diet. He has had no further abdominal pain since the above. The patient tells me that his wife is concerned of why he has had this change. She  is also questioning whether or not he needs biopsies to prove that he has celiac disease or not.   Patient denies fever, chills, bright blood in his stool, melena, weight loss, anorexia, nausea, vomiting or continued abdominal pain.  Past Medical History:  Diagnosis Date  . Allergy    enviromental  . Anemia 2017  . Anxiety   . Arthritis    ankylosing spodilitis  . Blood transfusion without reported diagnosis    during surgery  . Cataract   . Depression   . Diabetes ([place]) 2017   Manages with diet  . Dyspnea    with exertion - had Echo done 09/29/16  . Heart murmur    states he's never had any problems  . History of kidney stones   . Hyperlipidemia   . Intestinal obstruction ([place])   . Mental disorder   . Pneumonia    as a child    Past Surgical History:  Procedure Laterality Date  . ABDOMINAL SURGERY     had abcess  . APPENDECTOMY    . CHOLECYSTECTOMY     with lysis of adhesions  . COLONOSCOPY    . COLOSTOMY    . COLOSTOMY REVERSAL    . EYE SURGERY Left    scar tissue removed from cornea  . EYE SURGERY Right    cataract surgery with lens implant  . JOINT REPLACEMENT Right    hip  x 2 1999 and 2007  . SHOULDER ARTHROSCOPY WITH ROTATOR  CUFF REPAIR AND SUBACROMIAL DECOMPRESSION Left 03/02/2013   Procedure: LEFT SHOULDER ARTHROSCOPY WITH SUBACROMIAL DECOMPRESSION DISTAL CALVICLE RESECTION AND ROTATOR CUFF REPAIR ;  Surgeon: [name], MD;  Location: [facility];  Service: Orthopedics;  Laterality: Left;  . TOTAL HIP REVISION Right 11/06/2016   Procedure: TOTAL HIP REVISION OF THE ACETABULAR COMPONENT;  Surgeon: [name], MD;  Location: [facility];  Service: Orthopedics;  Laterality: Right;    Current Outpatient Prescriptions  Medication Sig Dispense Refill  . Artificial Tear Solution (SOOTHE XP) SOLN Place 1 drop into both eyes 2 (two) times daily.    . Biotin 5 MG CAPS Take 1 capsule by mouth daily.    . Calcium Carbonate-Vitamin D 600-400 MG-UNIT tablet Take 1 tablet by  mouth 2 (two) times daily.    . cholecalciferol (VITAMIN D) 1000 units tablet Take 1,000 Units by mouth daily.    . citalopram (CELEXA) 40 MG tablet Take 40 mg by mouth daily.    . folic acid (FOLVITE) 1 MG tablet Take 1 mg by mouth daily.     . Iron-FA-B Cmp-C-Biot-Probiotic (FUSION PLUS) CAPS Take 1 tablet by mouth daily at 3 pm.   12  . ketorolac (ACULAR) 0.5 % ophthalmic solution Place 1 drop into the right eye 4 (four) times daily.  2  . methotrexate (RHEUMATREX) 2.5 MG tablet Take by mouth. Take 20 mg by mouth every Sunday  5  . Multiple Vitamins-Minerals (PRESERVISION AREDS 2 PO) Take 1 tablet by mouth 2 (two) times daily.    . simvastatin (ZOCOR) 5 MG tablet Take 10 mg by mouth at bedtime.     No current facility-administered medications for this visit.     Allergies as of 11/24/2016 - Review Complete 11/24/2016  Allergen Reaction Noted  . Gluten meal  10/09/2016  . Indomethacin Hives 11/14/2012  . Lactose intolerance (gi)  10/09/2016    Family History  Problem Relation Age of Onset  . Heart attack Mother   . Diabetes Mother   . Breast cancer Mother   . Heart attack Maternal Grandfather   . Pancreatic cancer Brother   . Colon cancer Neg Hx   . Esophageal cancer Neg Hx   . Stomach cancer Neg Hx     Social History   Social History  . Marital status: Married    Spouse name: N/A  . Number of children: N/A  . Years of education: N/A   Occupational History  . Not on file.   Social History Main Topics  . Smoking status: Former Research scientist (life sciences)  . Smokeless tobacco: Never Used  . Alcohol use No     Comment: 3 beers or glasses of wine a day--reports stopped ETOH 11/01/16   . Drug use: No  . Sexual activity: Not on file   Other Topics Concern  . Not on file   Social History Narrative  . No narrative on file    Review of Systems:    Constitutional: No weight loss, fever or chills Skin: No rash  Cardiovascular: No chest pain Respiratory: No SOB  Gastrointestinal: See  HPI and otherwise negative   Physical Exam:  Vital signs: BP (!) 102/58   Pulse 72   Ht 5\' 6"  (1.676 m)   Wt 131 lb 9.6 oz (59.7 kg)   BMI 21.24 kg/m   Constitutional:   Pleasant Caucasian male appears to be in NAD, Well developed, Well nourished, alert and cooperative Respiratory: Respirations even and unlabored. Lungs clear to auscultation bilaterally.  No wheezes, crackles, or rhonchi.  Cardiovascular: Normal S1, S2. No MRG. Regular rate and rhythm. No peripheral edema, cyanosis or pallor.  Gastrointestinal:  Soft, nondistended, nontender. No rebound or guarding. Normal bowel sounds. No appreciable masses or hepatomegaly. Psychiatric: Demonstrates good judgement and reason without abnormal affect or behaviors.  RELEVANT LABS AND IMAGING: CBC    Component Value Date/Time   WBC 6.9 11/22/2016 1635   RBC 3.76 (L) 11/22/2016 1635   HGB 11.2 (L) 11/22/2016 1635   HGB 13.0 04/20/2012 0425   HCT 33.7 (L) 11/22/2016 1635   HCT 38.5 (L) 04/20/2012 0425   PLT 328 11/22/2016 1635   PLT 207 04/20/2012 0425   MCV 89.6 11/22/2016 1635   MCV 95 04/20/2012 0425   MCH 29.8 11/22/2016 1635   MCHC 33.2 11/22/2016 1635   RDW 17.8 (H) 11/22/2016 1635   RDW 13.2 04/20/2012 0425   LYMPHSABS 1.2 11/22/2016 1635   LYMPHSABS 0.9 (L) 04/20/2012 0425   MONOABS 0.6 11/22/2016 1635   MONOABS 0.6 04/20/2012 0425   EOSABS 0.3 11/22/2016 1635   EOSABS 0.2 04/20/2012 0425   BASOSABS 0.0 11/22/2016 1635   BASOSABS 0.0 04/20/2012 0425    CMP     Component Value Date/Time   NA 137 11/22/2016 1635   NA 141 04/20/2012 0425   K 3.9 11/22/2016 1635   K 4.1 04/20/2012 0425   CL 102 11/22/2016 1635   CL 109 (H) 04/20/2012 0425   CO2 24 11/22/2016 1635   CO2 28 04/20/2012 0425   GLUCOSE 90 11/22/2016 1635   GLUCOSE 144 (H) 04/20/2012 0425   BUN 13 11/22/2016 1635   BUN 14 04/20/2012 0425   CREATININE 0.81 11/22/2016 1635   CREATININE 0.95 04/20/2012 0425   CALCIUM 9.2 11/22/2016 1635   CALCIUM  8.2 (L) 04/20/2012 0425   PROT 7.5 11/22/2016 1635   PROT 7.0 04/20/2012 0425   ALBUMIN 4.0 11/22/2016 1635   ALBUMIN 3.5 04/20/2012 0425   AST 24 11/22/2016 1635   AST 38 (H) 04/20/2012 0425   ALT 23 11/22/2016 1635   ALT 41 04/20/2012 0425   ALKPHOS 65 11/22/2016 1635   ALKPHOS 40 (L) 04/20/2012 0425   BILITOT 0.9 11/22/2016 1635   BILITOT 0.6 04/20/2012 0425   GFRNONAA >60 11/22/2016 1635   GFRNONAA >60 04/20/2012 0425   GFRAA >60 11/22/2016 1635   GFRAA >60 04/20/2012 0425   Ct Abdomen Pelvis W Contrast  Result Date: 11/22/2016 CLINICAL DATA:  Abdominal pain with diarrhea EXAM: CT ABDOMEN AND PELVIS WITH CONTRAST TECHNIQUE: Multidetector CT imaging of the abdomen and pelvis was performed using the standard protocol following bolus administration of intravenous contrast. CONTRAST:  3mL ISOVUE-300 IOPAMIDOL (ISOVUE-300) INJECTION 61% COMPARISON:  03/21/2015 FINDINGS: Lower chest: Lung bases demonstrate mild scarring in the right middle lobe. No acute consolidation or pleural effusion is seen. The heart is nonenlarged. Hepatobiliary: No focal liver abnormality is seen. Status post cholecystectomy. No biliary dilatation. Pancreas: Unremarkable. No pancreatic ductal dilatation or surrounding inflammatory changes. Spleen: Normal in size without focal abnormality. Adrenals/Urinary Tract: Adrenal glands are within normal limits. Subcentimeter hypodensities within the kidneys too small to further characterize, not significantly change in probably representing small cysts. Multiple intrarenal stones bilaterally. Largest on the right is seen in the upper pole and measures 7 mm. Negative for hydronephrosis. Bladder within normal limits allowing for streak artifact Stomach/Bowel: Stomach nonenlarged. No bowel wall thickening. Postsurgical changes of the bowel in the left abdomen with slightly dilated segments of small bowel  at the anastomosis but without convincing evidence for an obstruction. No  additional dilated loops of bowel seen. Appendix not well identified. Scattered colon diverticula without acute inflammation. Vascular/Lymphatic: Moderate atherosclerosis of the aorta. No aneurysm is seen. No significantly enlarged abdominal or pelvic lymph nodes. Reproductive: Enlarged appearing prostate as before. Other: No free air or free fluid. Musculoskeletal: Status post right hip replacement. Ankylosis of the spine. IMPRESSION: 1. Postsurgical changes of the bowel in the left hemiabdomen but without convincing evidence for bowel obstruction at this time. No bowel wall thickening is seen. 2. Intrarenal calculi bilaterally.  Negative for hydronephrosis 3. Enlarged appearing prostate 4. Bony changes consistent with ankylosing spondylitis. 5. Colon diverticular changes without acute inflammation Electronically Signed   By: Donavan Foil M.D.   On: 11/22/2016 18:39    Assessment: 1. Change in bowel habits: Patient has now continue with 2 loose bowel movements in the morning which are "his new normal", did have some abdominal pain was seen in the ER, symptoms were relieved, Ct normal, labs normal; Most likely this represents IBS  Plan: 1. Discussed with the patient that doing an endoscopy with confirmation biopsies for celiac disease would not really change what we are doing now. He is currently on a gluten-free diet and does not want to stop this. 2. Explained to the patient that likely he has just had a change in bowel habits with age/IBS, currently these are not interfering with his life. We could do Hyoscyamine scheduled in the morning. Patient would prefer not to do anything at the moment as this is not altering his lifestyle. 3. Discussed possible colonoscopy in the future. Patient tells me that he would like to wait a few months before scheduling this. If he just continues with bowel movements in the morning that are his "new" normal he does not feel as though this is necessary. 4. Also discussed  that the patient could stop drinking coffee in the morning to see if this helps with his bowel movements. He tells me that he may try this, but if it is really just his coffee, then he would just continue drinking his coffee. 5. Patient to follow in clinic with Dr. Henrene Pastor at his next available appointment in October, if he continues to be worried regarding change in bowel habits, they can further discuss a colonoscopy at that time.  Ellouise Newer, PA-C [facility] 11/24/2016, 12:04 PM  Cc: Thressa Sheller, MD

## 2016-11-24 NOTE — Progress Notes (Signed)
Assessment and plans reviewed  

## 2016-11-25 DIAGNOSIS — M25551 Pain in right hip: Secondary | ICD-10-CM | POA: Diagnosis not present

## 2016-12-01 DIAGNOSIS — Z79899 Other long term (current) drug therapy: Secondary | ICD-10-CM | POA: Diagnosis not present

## 2016-12-01 DIAGNOSIS — H44111 Panuveitis, right eye: Secondary | ICD-10-CM | POA: Diagnosis not present

## 2016-12-01 DIAGNOSIS — J301 Allergic rhinitis due to pollen: Secondary | ICD-10-CM | POA: Diagnosis not present

## 2016-12-01 DIAGNOSIS — J3081 Allergic rhinitis due to animal (cat) (dog) hair and dander: Secondary | ICD-10-CM | POA: Diagnosis not present

## 2016-12-01 DIAGNOSIS — M25551 Pain in right hip: Secondary | ICD-10-CM | POA: Diagnosis not present

## 2016-12-01 DIAGNOSIS — J3089 Other allergic rhinitis: Secondary | ICD-10-CM | POA: Diagnosis not present

## 2016-12-03 DIAGNOSIS — M25551 Pain in right hip: Secondary | ICD-10-CM | POA: Diagnosis not present

## 2016-12-10 DIAGNOSIS — M25551 Pain in right hip: Secondary | ICD-10-CM | POA: Diagnosis not present

## 2016-12-11 DIAGNOSIS — T63451D Toxic effect of venom of hornets, accidental (unintentional), subsequent encounter: Secondary | ICD-10-CM | POA: Diagnosis not present

## 2016-12-11 DIAGNOSIS — T63461D Toxic effect of venom of wasps, accidental (unintentional), subsequent encounter: Secondary | ICD-10-CM | POA: Diagnosis not present

## 2016-12-11 DIAGNOSIS — T63441D Toxic effect of venom of bees, accidental (unintentional), subsequent encounter: Secondary | ICD-10-CM | POA: Diagnosis not present

## 2016-12-15 DIAGNOSIS — J3089 Other allergic rhinitis: Secondary | ICD-10-CM | POA: Diagnosis not present

## 2016-12-15 DIAGNOSIS — M25551 Pain in right hip: Secondary | ICD-10-CM | POA: Diagnosis not present

## 2016-12-15 DIAGNOSIS — J3081 Allergic rhinitis due to animal (cat) (dog) hair and dander: Secondary | ICD-10-CM | POA: Diagnosis not present

## 2016-12-15 DIAGNOSIS — J301 Allergic rhinitis due to pollen: Secondary | ICD-10-CM | POA: Diagnosis not present

## 2016-12-17 DIAGNOSIS — J301 Allergic rhinitis due to pollen: Secondary | ICD-10-CM | POA: Diagnosis not present

## 2016-12-17 DIAGNOSIS — J3089 Other allergic rhinitis: Secondary | ICD-10-CM | POA: Diagnosis not present

## 2016-12-17 DIAGNOSIS — M25551 Pain in right hip: Secondary | ICD-10-CM | POA: Diagnosis not present

## 2016-12-17 DIAGNOSIS — J3081 Allergic rhinitis due to animal (cat) (dog) hair and dander: Secondary | ICD-10-CM | POA: Diagnosis not present

## 2016-12-21 DIAGNOSIS — J3089 Other allergic rhinitis: Secondary | ICD-10-CM | POA: Diagnosis not present

## 2016-12-21 DIAGNOSIS — J3081 Allergic rhinitis due to animal (cat) (dog) hair and dander: Secondary | ICD-10-CM | POA: Diagnosis not present

## 2016-12-21 DIAGNOSIS — J301 Allergic rhinitis due to pollen: Secondary | ICD-10-CM | POA: Diagnosis not present

## 2016-12-23 DIAGNOSIS — M25551 Pain in right hip: Secondary | ICD-10-CM | POA: Diagnosis not present

## 2016-12-23 DIAGNOSIS — J301 Allergic rhinitis due to pollen: Secondary | ICD-10-CM | POA: Diagnosis not present

## 2016-12-23 DIAGNOSIS — J3089 Other allergic rhinitis: Secondary | ICD-10-CM | POA: Diagnosis not present

## 2016-12-23 DIAGNOSIS — J3081 Allergic rhinitis due to animal (cat) (dog) hair and dander: Secondary | ICD-10-CM | POA: Diagnosis not present

## 2016-12-29 DIAGNOSIS — M25551 Pain in right hip: Secondary | ICD-10-CM | POA: Diagnosis not present

## 2016-12-31 DIAGNOSIS — J3089 Other allergic rhinitis: Secondary | ICD-10-CM | POA: Diagnosis not present

## 2016-12-31 DIAGNOSIS — J301 Allergic rhinitis due to pollen: Secondary | ICD-10-CM | POA: Diagnosis not present

## 2016-12-31 DIAGNOSIS — J3081 Allergic rhinitis due to animal (cat) (dog) hair and dander: Secondary | ICD-10-CM | POA: Diagnosis not present

## 2016-12-31 DIAGNOSIS — M25551 Pain in right hip: Secondary | ICD-10-CM | POA: Diagnosis not present

## 2017-01-01 ENCOUNTER — Emergency Department (HOSPITAL_COMMUNITY): Payer: Medicare Other

## 2017-01-01 ENCOUNTER — Emergency Department (HOSPITAL_COMMUNITY)
Admission: EM | Admit: 2017-01-01 | Discharge: 2017-01-01 | Disposition: A | Discharge status: Home / Self Care | Payer: Medicare Other | Attending: Emergency Medicine | Admitting: Emergency Medicine

## 2017-01-01 ENCOUNTER — Encounter (HOSPITAL_COMMUNITY): Payer: Self-pay | Admitting: Nurse Practitioner

## 2017-01-01 DIAGNOSIS — S79911A Unspecified injury of right hip, initial encounter: Secondary | ICD-10-CM | POA: Diagnosis present

## 2017-01-01 DIAGNOSIS — S73004A Unspecified dislocation of right hip, initial encounter: Secondary | ICD-10-CM | POA: Diagnosis not present

## 2017-01-01 DIAGNOSIS — Z96641 Presence of right artificial hip joint: Secondary | ICD-10-CM | POA: Diagnosis not present

## 2017-01-01 DIAGNOSIS — X501XXA Overexertion from prolonged static or awkward postures, initial encounter: Secondary | ICD-10-CM | POA: Diagnosis not present

## 2017-01-01 DIAGNOSIS — Y999 Unspecified external cause status: Secondary | ICD-10-CM | POA: Insufficient documentation

## 2017-01-01 DIAGNOSIS — Y9389 Activity, other specified: Secondary | ICD-10-CM | POA: Insufficient documentation

## 2017-01-01 DIAGNOSIS — Y929 Unspecified place or not applicable: Secondary | ICD-10-CM | POA: Insufficient documentation

## 2017-01-01 DIAGNOSIS — R52 Pain, unspecified: Secondary | ICD-10-CM | POA: Diagnosis not present

## 2017-01-01 DIAGNOSIS — M25551 Pain in right hip: Secondary | ICD-10-CM | POA: Diagnosis not present

## 2017-01-01 DIAGNOSIS — T84020A Dislocation of internal right hip prosthesis, initial encounter: Secondary | ICD-10-CM | POA: Diagnosis not present

## 2017-01-01 DIAGNOSIS — Z471 Aftercare following joint replacement surgery: Secondary | ICD-10-CM | POA: Diagnosis not present

## 2017-01-01 LAB — BASIC METABOLIC PANEL
ANION GAP: 7 (ref 5–15)
BUN: 16 mg/dL (ref 6–20)
CHLORIDE: 105 mmol/L (ref 101–111)
CO2: 27 mmol/L (ref 22–32)
Calcium: 8.9 mg/dL (ref 8.9–10.3)
Creatinine, Ser: 0.63 mg/dL (ref 0.61–1.24)
GFR calc non Af Amer: >60 mL/min (ref >60)
GLUCOSE: 89 mg/dL (ref 65–99)
Potassium: 4.3 mmol/L (ref 3.5–5.1)
Sodium: 139 mmol/L (ref 135–145)

## 2017-01-01 LAB — CBC
HEMATOCRIT: 32.7 % — AB (ref 39.0–52.0)
HEMOGLOBIN: 11 g/dL — AB (ref 13.0–17.0)
MCH: 30.8 pg (ref 26.0–34.0)
MCHC: 33.6 g/dL (ref 30.0–36.0)
MCV: 91.6 fL (ref 78.0–100.0)
Platelets: 254 10*3/uL (ref 150–400)
RBC: 3.57 MIL/uL — ABNORMAL LOW (ref 4.22–5.81)
RDW: 14.8 % (ref 11.5–15.5)
WBC: 5.1 10*3/uL (ref 4.0–10.5)

## 2017-01-01 MED ORDER — SODIUM CHLORIDE 0.9 % IV SOLN
INTRAVENOUS | Status: DC
  Administered 2017-01-01: 12:00:00 via INTRAVENOUS

## 2017-01-01 MED ORDER — PROPOFOL 10 MG/ML IV BOLUS
INTRAVENOUS | Status: AC | PRN
  Administered 2017-01-01: 29.95 ug via INTRAVENOUS
  Administered 2017-01-01: 29.95 mg via INTRAVENOUS

## 2017-01-01 MED ORDER — MORPHINE SULFATE (PF) 4 MG/ML IV SOLN
4.0000 mg | Freq: Once | INTRAVENOUS | Status: AC
  Administered 2017-01-01: 4 mg via INTRAVENOUS
  Filled 2017-01-01: qty 1

## 2017-01-01 MED ORDER — PROPOFOL 10 MG/ML IV BOLUS
0.5000 mg/kg | INTRAVENOUS | Status: DC | PRN
  Administered 2017-01-01: 30 mg via INTRAVENOUS
  Filled 2017-01-01: qty 20

## 2017-01-01 NOTE — ED Triage Notes (Signed)
Pt arrived via [facility] from home with c/o right hip pain. Per ems pt reports having a hip replacement in 1999 and a correction surgery 2 months ago. His last PT was yesterday and today when he woke up to and was doing his home exercise and was pulling his knees to his chest when he heard a pop and severe pain. Per ems he does have some leg shortening and deformity of the affected side on right.  #18LAC, 19mcg Fentanyl total given, Pt reported pain 7/10 after medications he is now rating pain a 5/10. Last dosage 1012. VS: NSR, 127/70, 60, 18, 97%RA. CBG 90. Pt lives with wife and she will be here shortly.

## 2017-01-01 NOTE — ED Provider Notes (Signed)
[facility] DEPT Provider Note   CSN: [id number] Arrival date & time: 01/01/17  1103     History   Chief Complaint Chief Complaint  Patient presents with  . Hip Pain    Right     HPI ALFONSO CARDEN is a 82 y.o. male.  HPI Patient presents to the emergency room for evaluation of acute right hip pain. Patient has a history of prior arthroplasty of his right hip.  He required revision surgery in July of this year .  Patient states he had gone through his rehabilitation treatments.  This morning he was just doing some stretching exercises when he felt like his hip suddenly popped out of joint. He is now unable to his hip properly. He called EMS and was brought into the emergency room for evaluation. He denies any falls or injuries. Past Medical History:  Diagnosis Date  . Allergy    enviromental  . Anemia 2017  . Anxiety   . Arthritis    ankylosing spodilitis  . Blood transfusion without reported diagnosis    during surgery  . Cataract   . Depression   . Diabetes ([place]) 2017   Manages with diet  . Dyspnea    with exertion - had Echo done 09/29/16  . Heart murmur    states he's never had any problems  . History of kidney stones   . Hyperlipidemia   . Intestinal obstruction ([place])   . Mental disorder   . Pneumonia    as a child    Patient Active Problem List   Diagnosis Date Noted  . Failed arthroplasty ([place]) 11/06/2016  . 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 Dyspnea on exertion 09/11/2016  . SBO (small bowel obstruction) ([place]) 03/21/2015  . Abdominal pain 04/10/2014  . Bowel obstruction ([place]) 04/10/2014  . Hyperlipidemia 04/10/2014  . History of ankylosing spondylitis 04/10/2014    Past Surgical History:  Procedure Laterality Date  . ABDOMINAL SURGERY     had abcess  . APPENDECTOMY    . CHOLECYSTECTOMY     with lysis of adhesions  . COLONOSCOPY    . COLOSTOMY    . COLOSTOMY REVERSAL    . EYE SURGERY Left    scar tissue removed from  cornea  . EYE SURGERY Right    cataract surgery with lens implant  . JOINT REPLACEMENT Right    hip  x 2 1999 and 2007  . SHOULDER ARTHROSCOPY WITH ROTATOR CUFF REPAIR AND SUBACROMIAL DECOMPRESSION Left 03/02/2013   Procedure: LEFT SHOULDER ARTHROSCOPY WITH SUBACROMIAL DECOMPRESSION DISTAL CALVICLE RESECTION AND ROTATOR CUFF REPAIR ;  Surgeon: [name], MD;  Location: [facility];  Service: Orthopedics;  Laterality: Left;  . TOTAL HIP REVISION Right 11/06/2016   Procedure: TOTAL HIP REVISION OF THE ACETABULAR COMPONENT;  Surgeon: [name], MD;  Location: [facility];  Service: Orthopedics;  Laterality: Right;       Home Medications    Prior to Admission medications   Medication Sig Start Date End Date Taking? Authorizing Provider  Artificial Tear Solution (SOOTHE XP) SOLN Place 1 drop into both eyes 2 (two) times daily.   Yes [provider]  Biotin 5 MG CAPS Take 1 capsule by mouth daily.   Yes [provider]  Calcium Carbonate-Vitamin D 600-400 MG-UNIT tablet Take 1 tablet by mouth 2 (two) times daily.   Yes [provider]  cholecalciferol (VITAMIN D) 1000 units tablet Take 1,000 Units by mouth daily.   Yes  [provider]  citalopram (CELEXA) 40 MG tablet Take 40 mg by mouth daily.   Yes [provider]  folic acid (FOLVITE) 1 MG tablet Take 1 mg by mouth daily.    Yes [provider]  Iron-FA-B Cmp-C-Biot-Probiotic (FUSION PLUS) CAPS Take 1 tablet by mouth daily.  09/14/16  Yes [provider]  ketorolac (ACULAR) 0.5 % ophthalmic solution Place 1 drop into the right eye 4 (four) times daily. 08/28/16  Yes [provider]  meloxicam (MOBIC) 7.5 MG tablet Take 15 mg by mouth daily.  12/29/16  Yes [provider]  methotrexate (RHEUMATREX) 2.5 MG tablet Take 25 mg by mouth once a week. Sunday 09/15/16  Yes [provider]  Multiple Vitamins-Minerals (PRESERVISION AREDS 2 PO) Take 1 tablet by mouth 2 (two)  times daily.   Yes [provider]  prednisoLONE acetate (PRED FORTE) 1 % ophthalmic suspension Place 1 drop into the right eye 4 (four) times daily. 12/25/16  Yes [provider]  Probiotic Product (PROBIOTIC PO) Take 1 capsule by mouth daily.   Yes [provider]  simvastatin (ZOCOR) 5 MG tablet Take 10 mg by mouth at bedtime.   Yes [provider]    Family History Family History  Problem Relation Age of Onset  . Heart attack Mother   . Diabetes Mother   . Breast cancer Mother   . Heart attack Maternal Grandfather   . Pancreatic cancer Brother   . Colon cancer Neg Hx   . Esophageal cancer Neg Hx   . Stomach cancer Neg Hx     Social History Social History  Substance Use Topics  . Smoking status: Former Research scientist (life sciences)  . Smokeless tobacco: Never Used  . Alcohol use No     Comment: 3 beers or glasses of wine a day--reports stopped ETOH 11/01/16      Allergies   Gluten meal; Indomethacin; and Lactose intolerance (gi)   Review of Systems Review of Systems  All other systems reviewed and are negative.    Physical Exam Updated Vital Signs BP 92/68   Pulse (!) 58   Temp 97.7 F (36.5 C)   Resp 12   Ht 1.676 m (5\' 6" )   Wt 59.9 kg (132 lb)   SpO2 98%   BMI 21.31 kg/m   Physical Exam  Constitutional: No distress.  HENT:  Head: Normocephalic and atraumatic.  Right Ear: External ear normal.  Left Ear: External ear normal.  Eyes: Conjunctivae are normal. Right eye exhibits no discharge. Left eye exhibits no discharge. No scleral icterus.  Neck: Neck supple. No tracheal deviation present.  Cardiovascular: Normal rate, regular rhythm and intact distal pulses.   Pulmonary/Chest: Effort normal and breath sounds normal. No stridor. No respiratory distress. He has no wheezes. He has no rales.  Abdominal: Soft. Bowel sounds are normal. He exhibits no distension. There is no tenderness. There is no rebound and no guarding.  Musculoskeletal: He  exhibits no edema.       Right hip: He exhibits decreased range of motion, tenderness and deformity.  Right hip is internally rotated, and knee is flexed  Neurological: He is alert. He has normal strength. No cranial nerve deficit (no facial droop, extraocular movements intact, no slurred speech) or sensory deficit. He exhibits normal muscle tone. He displays no seizure activity. Coordination normal.  Skin: Skin is warm and dry. No rash noted. He is not diaphoretic.  Psychiatric: He has a normal mood and affect.  Nursing  note and vitals reviewed.    ED Treatments / Results  Labs (all labs ordered are listed, but only abnormal results are displayed) Labs Reviewed  CBC - Abnormal; Notable for the following:       Result Value   RBC 3.57 (*)    Hemoglobin 11.0 (*)    HCT 32.7 (*)    All other components within normal limits  BASIC METABOLIC PANEL     Radiology Dg Hip Unilat  With Pelvis 2-3 Views Right  Result Date: 01/01/2017 CLINICAL DATA:  Pain EXAM: DG HIP (WITH OR WITHOUT PELVIS) 2-3V RIGHT COMPARISON:  None. FINDINGS: Frontal pelvis as well as frontal and lateral right hip images were obtained. Patient has had previous total hip replacement right. There is dislocation at the total hip prosthesis on the right with the femoral component displaced laterally and posteriorly with respect to the acetabular component. No fracture evident. There is moderate narrowing of the left hip joint. IMPRESSION: Dislocation of total hip prosthesis on the right with the femoral component displaced lateral and posterior to the acetabular component. No fracture. Moderate narrowing left hip joint. Electronically Signed   By: Lowella Grip III M.D.   On: 01/01/2017 14:15    Procedures .Sedation Date/Time: 01/01/2017 3:00 PM Performed by: Dorie Rank Authorized by: Dorie Rank   Consent:    Consent obtained:  Written   Consent given by:  Patient   Risks discussed:  Allergic reaction, dysrhythmia,  inadequate sedation, nausea, prolonged hypoxia resulting in organ damage, prolonged sedation necessitating reversal, respiratory compromise necessitating ventilatory assistance and intubation and vomiting   Alternatives discussed:  Analgesia without sedation Universal protocol:    Procedure explained and questions answered to patient or proxy's satisfaction: yes     Imaging studies available: yes     Required blood products, implants, devices, and special equipment available: yes     Site/side marked: yes     Immediately prior to procedure a time out was called: yes     Patient identity confirmation method:  Anonymous protocol, patient vented/unresponsive, arm band, hospital-assigned identification number, verbally with patient and provided demographic data Indications:    Procedure performed:  Dislocation reduction   Procedure necessitating sedation performed by:  Physician performing sedation   Intended level of sedation:  Deep Pre-sedation assessment:    Time since last food or drink:  9pm last night   ASA classification: class 1 - normal, healthy patient     Mouth opening:  3 or more finger widths   Thyromental distance:  4 finger widths   Mallampati score:  II - soft palate, uvula, fauces visible   Pre-sedation assessments completed and reviewed: airway patency, cardiovascular function, hydration status, mental status, nausea/vomiting, pain level, respiratory function and temperature     Pre-sedation assessment completed:  01/01/2017 3:02 PM Immediate pre-procedure details:    Reassessment: Patient reassessed immediately prior to procedure     Reviewed: vital signs, relevant labs/tests and NPO status     Verified: bag valve mask available, emergency equipment available, IV patency confirmed, oxygen available and suction available   Procedure details (see MAR for exact dosages):    Preoxygenation:  Nasal cannula   Sedation:  Propofol   Intra-procedure monitoring:  Blood pressure  monitoring, cardiac monitor, continuous capnometry, continuous pulse oximetry, frequent LOC assessments and frequent vital sign checks   Intra-procedure events: none     Total Provider sedation time (minutes):  30 Post-procedure details:    Post-sedation assessment completed:  01/01/2017 3:55  PM   Attendance: Constant attendance by certified staff until patient recovered     Recovery: Patient returned to pre-procedure baseline     Post-sedation assessments completed and reviewed: airway patency, cardiovascular function, hydration status, mental status, nausea/vomiting, respiratory function and temperature     Patient is stable for discharge or admission: yes     Patient tolerance:  Tolerated well, no immediate complications  ORTHOPEDIC INJURY TREATMENT Date/Time: 01/01/2017 3:56 PM Performed by: Dorie Rank Authorized by: Dorie Rank  Consent: Verbal consent obtained. Risks and benefits: risks, benefits and alternatives were discussed Consent given by: patient Patient understanding: patient states understanding of the procedure being performed Patient consent: the patient's understanding of the procedure matches consent given Procedure consent: procedure consent matches procedure scheduled Relevant documents: relevant documents present and verified Site marked: the operative site was marked Imaging studies: imaging studies available Patient identity confirmed: verbally with patient Injury location: hip Location details: right hip Injury type: dislocation Spontaneous dislocation: yes Prosthesis: yes Pre-procedure neurovascular assessment: neurovascularly intact Pre-procedure distal perfusion: normal Pre-procedure neurological function: normal Pre-procedure range of motion: normal  Anesthesia: Local anesthesia used: no  Sedation: Patient sedated: yes Manipulation performed: yes Reduction method: traction and counter traction Reduction successful: yes X-ray confirmed reduction:  yes Immobilization: knee imobilizer. Post-procedure neurovascular assessment: post-procedure neurovascularly intact Post-procedure distal perfusion: normal Post-procedure neurological function: normal Post-procedure range of motion: normal Patient tolerance: Patient tolerated the procedure well with no immediate complications    (including critical care time)  Medications Ordered in ED Medications  0.9 %  sodium chloride infusion ( Intravenous New Bag/Given 01/01/17 1140)  propofol (DIPRIVAN) 10 mg/mL bolus/IV push 30 mg (30 mg Intravenous Given 01/01/17 1533)  morphine 4 MG/ML injection 4 mg (4 mg Intravenous Given 01/01/17 1307)  morphine 4 MG/ML injection 4 mg (4 mg Intravenous Given 01/01/17 1532)     Initial Impression / Assessment and Plan / ED Course  I have reviewed the triage vital signs and the nursing notes.  Pertinent labs & imaging results that were available during my care of the patient were reviewed by me and considered in my medical decision making (see chart for details).  Clinical Course as of Jan 01 1553  Fri Jan 01, 2017  1134 Concerning for probable hip dislocation.  Will xray  [JK]  9024 Last food or drink was 9pm last night  [JK]  1457 Dr Mayer Camel is in the OR.  Attempted to Call Dr Shaune Spittle PA.  Unable to reach him at [phone]  [JK]  1500 Discussed findings with patient.  Will attempt reduction in the ED. DIscussed procedural sedation and reduction.  Pt agrees  [JK]    Clinical Course User Index [JK] Dorie Rank, MD   Xrays confirmed dislocation.  Pt tolerated reduction.  Xrays reviewed by me.  No sign of dislocation.  Stable for discharge with knee imobilizer. Walker for support.  Follow up with Dr Doneta Public  Final Clinical Impressions(s) / ED Diagnoses   Final diagnoses:  Hip dislocation, right, initial encounter [facility])    New Prescriptions New Prescriptions   No medications on file     Dorie Rank, MD 01/01/17 1559

## 2017-01-01 NOTE — ED Notes (Signed)
Patient transported to X-ray 

## 2017-01-01 NOTE — Discharge Instructions (Signed)
Keep the knee immobilzer on, use your walker temporarily, follow up with Dr Mayer Camel in his office, call to schedule an appointment

## 2017-01-04 DIAGNOSIS — J3081 Allergic rhinitis due to animal (cat) (dog) hair and dander: Secondary | ICD-10-CM | POA: Diagnosis not present

## 2017-01-04 DIAGNOSIS — J3089 Other allergic rhinitis: Secondary | ICD-10-CM | POA: Diagnosis not present

## 2017-01-04 DIAGNOSIS — J301 Allergic rhinitis due to pollen: Secondary | ICD-10-CM | POA: Diagnosis not present

## 2017-01-05 DIAGNOSIS — H2512 Age-related nuclear cataract, left eye: Secondary | ICD-10-CM | POA: Diagnosis not present

## 2017-01-05 DIAGNOSIS — H35711 Central serous chorioretinopathy, right eye: Secondary | ICD-10-CM | POA: Diagnosis not present

## 2017-01-05 DIAGNOSIS — H44111 Panuveitis, right eye: Secondary | ICD-10-CM | POA: Diagnosis not present

## 2017-01-05 DIAGNOSIS — M452 Ankylosing spondylitis of cervical region: Secondary | ICD-10-CM | POA: Diagnosis not present

## 2017-01-05 DIAGNOSIS — Z9841 Cataract extraction status, right eye: Secondary | ICD-10-CM | POA: Diagnosis not present

## 2017-01-06 DIAGNOSIS — Z9889 Other specified postprocedural states: Secondary | ICD-10-CM | POA: Diagnosis not present

## 2017-01-07 DIAGNOSIS — M459 Ankylosing spondylitis of unspecified sites in spine: Secondary | ICD-10-CM | POA: Diagnosis not present

## 2017-01-07 DIAGNOSIS — M25511 Pain in right shoulder: Secondary | ICD-10-CM | POA: Diagnosis not present

## 2017-01-07 DIAGNOSIS — M545 Low back pain: Secondary | ICD-10-CM | POA: Diagnosis not present

## 2017-01-07 DIAGNOSIS — Z791 Long term (current) use of non-steroidal anti-inflammatories (NSAID): Secondary | ICD-10-CM | POA: Diagnosis not present

## 2017-01-11 DIAGNOSIS — J3081 Allergic rhinitis due to animal (cat) (dog) hair and dander: Secondary | ICD-10-CM | POA: Diagnosis not present

## 2017-01-11 DIAGNOSIS — J3089 Other allergic rhinitis: Secondary | ICD-10-CM | POA: Diagnosis not present

## 2017-01-11 DIAGNOSIS — J301 Allergic rhinitis due to pollen: Secondary | ICD-10-CM | POA: Diagnosis not present

## 2017-01-14 ENCOUNTER — Ambulatory Visit (INDEPENDENT_AMBULATORY_CARE_PROVIDER_SITE_OTHER): Payer: Medicare Other | Admitting: Internal Medicine

## 2017-01-14 ENCOUNTER — Encounter: Payer: Self-pay | Admitting: Internal Medicine

## 2017-01-14 VITALS — BP 116/52 | HR 80 | Ht 66.0 in | Wt 135.2 lb

## 2017-01-14 DIAGNOSIS — T63441D Toxic effect of venom of bees, accidental (unintentional), subsequent encounter: Secondary | ICD-10-CM | POA: Diagnosis not present

## 2017-01-14 DIAGNOSIS — T63451D Toxic effect of venom of hornets, accidental (unintentional), subsequent encounter: Secondary | ICD-10-CM | POA: Diagnosis not present

## 2017-01-14 DIAGNOSIS — R768 Other specified abnormal immunological findings in serum: Secondary | ICD-10-CM | POA: Diagnosis not present

## 2017-01-14 DIAGNOSIS — D509 Iron deficiency anemia, unspecified: Secondary | ICD-10-CM

## 2017-01-14 DIAGNOSIS — R197 Diarrhea, unspecified: Secondary | ICD-10-CM | POA: Diagnosis not present

## 2017-01-14 DIAGNOSIS — R194 Change in bowel habit: Secondary | ICD-10-CM | POA: Diagnosis not present

## 2017-01-14 DIAGNOSIS — T63461D Toxic effect of venom of wasps, accidental (unintentional), subsequent encounter: Secondary | ICD-10-CM | POA: Diagnosis not present

## 2017-01-14 NOTE — Progress Notes (Signed)
HISTORY OF PRESENT ILLNESS:  Steven [name] is a 82 y.o. male with chronic arthritis and ankylosing spondylitis who presents today for follow-up regarding iron deficiency anemia and change in bowel habits. Seen previously by the GI physician assistant 09/16/2016 and 11/24/2016. In brief, approximately 5 months ago the patient had a change in bowel habits from his baseline. From previous one formed bowel movement daily to formed bowel movement in the morning followed by diarrheal stool 30-40 minutes after breakfast. No additional BMs throughout the day. No abdominal pain. He has had chronic normocytic anemia dating back to 2016. Low iron saturation and may 2018. Celiac testing with positive gliadin IgG. Remainder of celiac panel negative or normal. Patient did initiate and has been on a gluten-free diet without appreciable change in his bowel habits. He has been on iron replacement therapy with improvement in his hemoglobin. His recent value 11.0. He does take daily NSAIDs in the form of pelvic. He is not on a PPI. His weight has been stable in recent months. He was diagnosed with diabetes approximately 1 year ago. GI review of systems is otherwise negative. The patient has undergone previous colonoscopies in 1993, 2004, and 2015. Recent Hemoccult studies were negative 3. All negative for neoplasia. Review of outside x-ray in the form of CT scan obtained 11/22/2016 revealed no acute abnormalities.  REVIEW OF SYSTEMS:  All non-GI ROS negative except for arthritis, depression, hearing problems, allergies  Past Medical History:  Diagnosis Date  . Allergy    enviromental  . Anemia 2017  . Anxiety   . Arthritis    ankylosing spodilitis  . Blood transfusion without reported diagnosis    during surgery  . Cataract   . Depression   . Diabetes ([place]) 2017   Manages with diet  . Dyspnea    with exertion - had Echo done 09/29/16  . Heart murmur    states he's never had any problems  . History of kidney  stones   . Hyperlipidemia   . Intestinal obstruction ([place])   . Mental disorder   . Pneumonia    as a child    Past Surgical History:  Procedure Laterality Date  . ABDOMINAL SURGERY     had abcess  . APPENDECTOMY    . CHOLECYSTECTOMY     with lysis of adhesions  . COLONOSCOPY    . COLOSTOMY    . COLOSTOMY REVERSAL    . EYE SURGERY Left    scar tissue removed from cornea  . EYE SURGERY Right    cataract surgery with lens implant  . JOINT REPLACEMENT Right    hip  x 2 1999 and 2007  . SHOULDER ARTHROSCOPY WITH ROTATOR CUFF REPAIR AND SUBACROMIAL DECOMPRESSION Left 03/02/2013   Procedure: LEFT SHOULDER ARTHROSCOPY WITH SUBACROMIAL DECOMPRESSION DISTAL CALVICLE RESECTION AND ROTATOR CUFF REPAIR ;  Surgeon: [name], MD;  Location: [place];  Service: Orthopedics;  Laterality: Left;  . TOTAL HIP REVISION Right 11/06/2016   Procedure: TOTAL HIP REVISION OF THE ACETABULAR COMPONENT;  Surgeon: [name], MD;  Location: [place];  Service: Orthopedics;  Laterality: Right;    Social History Steven [name]  reports that he has quit smoking. He has never used smokeless tobacco. He reports that he does not drink alcohol or use drugs.  family history includes Breast cancer in his mother; Diabetes in his mother; Heart attack in his maternal grandfather and mother; Pancreatic cancer in his brother.  Allergies  Allergen Reactions  .  Gluten Meal     GLUTEN ALLERGY/INTOLERANCE  . Indomethacin Hives  . Lactose Intolerance (Gi)     GI UPSET       PHYSICAL EXAMINATION: Vital signs: BP (!) 116/52   Pulse 80   Ht 5\' 6"  (1.676 m)   Wt 135 lb 4 oz (61.3 kg)   BMI 21.83 kg/m   Constitutional: generally well-appearing, no acute distress. Kyphosis Psychiatric: alert and oriented x3, cooperative Eyes: extraocular movements intact, anicteric, conjunctiva pink Mouth: oral pharynx moist, no lesions Neck: supple no lymphadenopathy Cardiovascular: heart regular rate and rhythm, no  murmur Lungs: clear to auscultation bilaterally Abdomen: soft, nontender, nondistended, no obvious ascites, no peritoneal signs, normal bowel sounds, no organomegaly Rectal: Omitted Extremities: no clubbing cyanosis or lower extremity edema bilaterally. Arthritic deformity of the hands Skin: no lesions on visible extremities Neuro: No focal deficits. Cranial nerves intact. No asterixis.    ASSESSMENT:  #1. Chronic normocytic anemia. Chronic disease versus iron deficiency. On chronic NSAIDs. Has chronic arthritis. Multiple negative colonoscopies as described #2. Question celiac disease based on serology. Unlikely as the tissue transglutaminase antibody and endomesial  antibodies were negative #3. Change in bowel habits. No alarm features. Possibly medication. Possibly postinfectious alteration in gut motility   PLAN:  #1. Advised to abandon gluten-free diet #2. Continue iron #3. Schedule upper endoscopy with biopsies in approximately 6 weeks to rule out upper GI mucosal lesions may contribute to anemia and assess for celiac disease after resuming a regular diet.The nature of the procedure, as well as the risks, benefits, and alternatives were carefully and thoroughly reviewed with the patient. Ample time for discussion and questions allowed. The patient understood, was satisfied, and agreed to proceed.  40 minutes spent face-to-face with the patient. Greater than 50% of the time was for counseling regarding his change in bowel habits, question surrounding possible celiac disease, and further evaluation of anemia. Multiple excellent questions answered to his satisfaction

## 2017-01-14 NOTE — Patient Instructions (Signed)
Discontinue your gluten free diet  You have been scheduled for an endoscopy and flexible sigmoidoscopy. Please follow the written instructions given to you at your visit today. If you use inhalers (even only as needed), please bring them with you on the day of your procedure. Your physician has requested that you go to www.startemmi.com and enter the access code given to you at your visit today. This web site gives a general overview about your procedure. However, you should still follow specific instructions given to you by our office regarding your preparation for the procedure.

## 2017-01-20 DIAGNOSIS — J3089 Other allergic rhinitis: Secondary | ICD-10-CM | POA: Diagnosis not present

## 2017-01-20 DIAGNOSIS — J3081 Allergic rhinitis due to animal (cat) (dog) hair and dander: Secondary | ICD-10-CM | POA: Diagnosis not present

## 2017-01-20 DIAGNOSIS — J301 Allergic rhinitis due to pollen: Secondary | ICD-10-CM | POA: Diagnosis not present

## 2017-01-20 DIAGNOSIS — T63441A Toxic effect of venom of bees, accidental (unintentional), initial encounter: Secondary | ICD-10-CM | POA: Diagnosis not present

## 2017-01-27 DIAGNOSIS — J3081 Allergic rhinitis due to animal (cat) (dog) hair and dander: Secondary | ICD-10-CM | POA: Diagnosis not present

## 2017-01-27 DIAGNOSIS — J301 Allergic rhinitis due to pollen: Secondary | ICD-10-CM | POA: Diagnosis not present

## 2017-01-27 DIAGNOSIS — J3089 Other allergic rhinitis: Secondary | ICD-10-CM | POA: Diagnosis not present

## 2017-02-02 DIAGNOSIS — J301 Allergic rhinitis due to pollen: Secondary | ICD-10-CM | POA: Diagnosis not present

## 2017-02-02 DIAGNOSIS — J3081 Allergic rhinitis due to animal (cat) (dog) hair and dander: Secondary | ICD-10-CM | POA: Diagnosis not present

## 2017-02-02 DIAGNOSIS — J3089 Other allergic rhinitis: Secondary | ICD-10-CM | POA: Diagnosis not present

## 2017-02-04 DIAGNOSIS — Z9889 Other specified postprocedural states: Secondary | ICD-10-CM | POA: Diagnosis not present

## 2017-02-08 DIAGNOSIS — J3081 Allergic rhinitis due to animal (cat) (dog) hair and dander: Secondary | ICD-10-CM | POA: Diagnosis not present

## 2017-02-08 DIAGNOSIS — J301 Allergic rhinitis due to pollen: Secondary | ICD-10-CM | POA: Diagnosis not present

## 2017-02-08 DIAGNOSIS — J3089 Other allergic rhinitis: Secondary | ICD-10-CM | POA: Diagnosis not present

## 2017-02-15 DIAGNOSIS — J3089 Other allergic rhinitis: Secondary | ICD-10-CM | POA: Diagnosis not present

## 2017-02-15 DIAGNOSIS — J3081 Allergic rhinitis due to animal (cat) (dog) hair and dander: Secondary | ICD-10-CM | POA: Diagnosis not present

## 2017-02-15 DIAGNOSIS — J301 Allergic rhinitis due to pollen: Secondary | ICD-10-CM | POA: Diagnosis not present

## 2017-02-16 DIAGNOSIS — H2512 Age-related nuclear cataract, left eye: Secondary | ICD-10-CM | POA: Diagnosis not present

## 2017-02-16 DIAGNOSIS — H35711 Central serous chorioretinopathy, right eye: Secondary | ICD-10-CM | POA: Diagnosis not present

## 2017-02-16 DIAGNOSIS — Z9841 Cataract extraction status, right eye: Secondary | ICD-10-CM | POA: Diagnosis not present

## 2017-02-16 DIAGNOSIS — H44111 Panuveitis, right eye: Secondary | ICD-10-CM | POA: Diagnosis not present

## 2017-02-16 DIAGNOSIS — M452 Ankylosing spondylitis of cervical region: Secondary | ICD-10-CM | POA: Diagnosis not present

## 2017-02-17 DIAGNOSIS — H44111 Panuveitis, right eye: Secondary | ICD-10-CM | POA: Diagnosis not present

## 2017-02-17 DIAGNOSIS — Z79899 Other long term (current) drug therapy: Secondary | ICD-10-CM | POA: Diagnosis not present

## 2017-02-17 DIAGNOSIS — T63441D Toxic effect of venom of bees, accidental (unintentional), subsequent encounter: Secondary | ICD-10-CM | POA: Diagnosis not present

## 2017-02-17 DIAGNOSIS — T63451D Toxic effect of venom of hornets, accidental (unintentional), subsequent encounter: Secondary | ICD-10-CM | POA: Diagnosis not present

## 2017-02-17 DIAGNOSIS — Z5181 Encounter for therapeutic drug level monitoring: Secondary | ICD-10-CM | POA: Diagnosis not present

## 2017-02-17 DIAGNOSIS — T63461D Toxic effect of venom of wasps, accidental (unintentional), subsequent encounter: Secondary | ICD-10-CM | POA: Diagnosis not present

## 2017-02-22 DIAGNOSIS — J301 Allergic rhinitis due to pollen: Secondary | ICD-10-CM | POA: Diagnosis not present

## 2017-02-22 DIAGNOSIS — J3089 Other allergic rhinitis: Secondary | ICD-10-CM | POA: Diagnosis not present

## 2017-02-22 DIAGNOSIS — J3081 Allergic rhinitis due to animal (cat) (dog) hair and dander: Secondary | ICD-10-CM | POA: Diagnosis not present

## 2017-03-01 ENCOUNTER — Encounter: Payer: Self-pay | Admitting: Internal Medicine

## 2017-03-01 DIAGNOSIS — J3089 Other allergic rhinitis: Secondary | ICD-10-CM | POA: Diagnosis not present

## 2017-03-01 DIAGNOSIS — J3081 Allergic rhinitis due to animal (cat) (dog) hair and dander: Secondary | ICD-10-CM | POA: Diagnosis not present

## 2017-03-01 DIAGNOSIS — J301 Allergic rhinitis due to pollen: Secondary | ICD-10-CM | POA: Diagnosis not present

## 2017-03-02 ENCOUNTER — Telehealth: Payer: Self-pay | Admitting: Internal Medicine

## 2017-03-02 NOTE — Telephone Encounter (Signed)
Pt scheduled for EGD 03/11/17. States his blood test for celiac was negative and he added back wheat several months ago and he has not had any problems. He would like to come and have a CBC to see if his counts have come up. If his counts have come up he would like to cancel the EGD. Please advise.

## 2017-03-02 NOTE — Telephone Encounter (Signed)
Okay to repeat CBC, however he will need EGD either way to rule out mucosal lesions (erosions, AVMs, submucosal lesions with superficial ulceration etc.) that would account for anemia

## 2017-03-03 ENCOUNTER — Other Ambulatory Visit: Payer: Self-pay

## 2017-03-03 DIAGNOSIS — D649 Anemia, unspecified: Secondary | ICD-10-CM

## 2017-03-03 NOTE — Telephone Encounter (Signed)
Spoke with pt and he is aware, order in for CBC.

## 2017-03-08 ENCOUNTER — Other Ambulatory Visit (INDEPENDENT_AMBULATORY_CARE_PROVIDER_SITE_OTHER): Payer: Medicare Other

## 2017-03-08 DIAGNOSIS — D649 Anemia, unspecified: Secondary | ICD-10-CM | POA: Diagnosis not present

## 2017-03-08 LAB — CBC WITH DIFFERENTIAL/PLATELET
BASOS PCT: 0.8 % (ref 0.0–3.0)
Basophils Absolute: 0.1 10*3/uL (ref 0.0–0.1)
EOS ABS: 0.3 10*3/uL (ref 0.0–0.7)
Eosinophils Relative: 5.1 % — ABNORMAL HIGH (ref 0.0–5.0)
HCT: 35.8 % — ABNORMAL LOW (ref 39.0–52.0)
Hemoglobin: 11.9 g/dL — ABNORMAL LOW (ref 13.0–17.0)
LYMPHS ABS: 1.1 10*3/uL (ref 0.7–4.0)
Lymphocytes Relative: 18 % (ref 12.0–46.0)
MCHC: 33.2 g/dL (ref 30.0–36.0)
MCV: 98.9 fl (ref 78.0–100.0)
MONO ABS: 0.7 10*3/uL (ref 0.1–1.0)
Monocytes Relative: 11.4 % (ref 3.0–12.0)
NEUTROS ABS: 4 10*3/uL (ref 1.4–7.7)
Neutrophils Relative %: 64.7 % (ref 43.0–77.0)
PLATELETS: 269 10*3/uL (ref 150.0–400.0)
RBC: 3.62 Mil/uL — ABNORMAL LOW (ref 4.22–5.81)
RDW: 15 % (ref 11.5–15.5)
WBC: 6.2 10*3/uL (ref 4.0–10.5)

## 2017-03-09 DIAGNOSIS — J3089 Other allergic rhinitis: Secondary | ICD-10-CM | POA: Diagnosis not present

## 2017-03-09 DIAGNOSIS — J3081 Allergic rhinitis due to animal (cat) (dog) hair and dander: Secondary | ICD-10-CM | POA: Diagnosis not present

## 2017-03-09 DIAGNOSIS — J301 Allergic rhinitis due to pollen: Secondary | ICD-10-CM | POA: Diagnosis not present

## 2017-03-11 ENCOUNTER — Ambulatory Visit (AMBULATORY_SURGERY_CENTER): Payer: Medicare Other | Admitting: Internal Medicine

## 2017-03-11 ENCOUNTER — Other Ambulatory Visit: Payer: Self-pay

## 2017-03-11 ENCOUNTER — Encounter: Payer: Self-pay | Admitting: Internal Medicine

## 2017-03-11 VITALS — BP 134/63 | HR 59 | Temp 96.8°F | Resp 14 | Ht 66.0 in | Wt 135.0 lb

## 2017-03-11 DIAGNOSIS — D508 Other iron deficiency anemias: Secondary | ICD-10-CM

## 2017-03-11 DIAGNOSIS — K3189 Other diseases of stomach and duodenum: Secondary | ICD-10-CM | POA: Diagnosis not present

## 2017-03-11 DIAGNOSIS — D509 Iron deficiency anemia, unspecified: Secondary | ICD-10-CM | POA: Diagnosis not present

## 2017-03-11 MED ORDER — SODIUM CHLORIDE 0.9 % IV SOLN: 500.0000 mL | INTRAVENOUS | Status: DC

## 2017-03-11 MED ORDER — OMEPRAZOLE 20 MG PO CPDR: 20.0000 mg | DELAYED_RELEASE_CAPSULE | Freq: Every day | ORAL | 11 refills | Status: DC

## 2017-03-11 NOTE — Patient Instructions (Signed)
**   Continue Iron Supplement. Start taking Omeprazole daily! ** Handout given on peptic ulcer disease **   YOU HAD AN ENDOSCOPIC PROCEDURE TODAY AT [facility]:   Refer to the procedure report that was given to you for any specific questions about what was found during the examination.  If the procedure report does not answer your questions, please call your gastroenterologist to clarify.  If you requested that your care partner not be given the details of your procedure findings, then the procedure report has been included in a sealed envelope for you to review at your convenience later.  YOU SHOULD EXPECT: Some feelings of bloating in the abdomen. Passage of more gas than usual.  Walking can help get rid of the air that was put into your GI tract during the procedure and reduce the bloating. If you had a lower endoscopy (such as a colonoscopy or flexible sigmoidoscopy) you may notice spotting of blood in your stool or on the toilet paper. If you underwent a bowel prep for your procedure, you may not have a normal bowel movement for a few days.  Please Note:  You might notice some irritation and congestion in your nose or some drainage.  This is from the oxygen used during your procedure.  There is no need for concern and it should clear up in a day or so.  SYMPTOMS TO REPORT IMMEDIATELY:   Following upper endoscopy (EGD)  Vomiting of blood or coffee ground material  New chest pain or pain under the shoulder blades  Painful or persistently difficult swallowing  New shortness of breath  Fever of 100F or higher  Black, tarry-looking stools  For urgent or emergent issues, a gastroenterologist can be reached at any hour by calling [phone].   DIET:  We do recommend a small meal at first, but then you may proceed to your regular diet.  Drink plenty of fluids but you should avoid alcoholic beverages for 24 hours.  ACTIVITY:  You should plan to take it easy for the rest of  today and you should NOT DRIVE or use heavy machinery until tomorrow (because of the sedation medicines used during the test).    FOLLOW UP: Our staff will call the number listed on your records the next business day following your procedure to check on you and address any questions or concerns that you may have regarding the information given to you following your procedure. If we do not reach you, we will leave a message.  However, if you are feeling well and you are not experiencing any problems, there is no need to return our call.  We will assume that you have returned to your regular daily activities without incident.  If any biopsies were taken you will be contacted by phone or by letter within the next 1-3 weeks.  Please call us at [phone] if you have not heard about the biopsies in 3 weeks.    SIGNATURES/CONFIDENTIALITY: You and/or your care partner have signed paperwork which will be entered into your electronic medical record.  These signatures attest to the fact that that the information above on your After Visit Summary has been reviewed and is understood.  Full responsibility of the confidentiality of this discharge information lies with you and/or your care-partner.

## 2017-03-11 NOTE — Progress Notes (Signed)
Report to PACU, RN, vss, BBS= Clear.  

## 2017-03-11 NOTE — Progress Notes (Signed)
Called to room to assist during endoscopic procedure.  Patient ID and intended procedure confirmed with present staff. Received instructions for my participation in the procedure from the performing physician.  

## 2017-03-11 NOTE — Op Note (Signed)
[facility] Patient Name: Steven [name] Procedure Date: 03/11/2017 2:59 PM MRN: [id number] Endoscopist: Docia Chuck. Henrene Pastor , MD Age: 82 Referring MD:  Date of Birth: 01-Feb-1935 Gender: Male Account #: [id number] Procedure:                Upper GI endoscopy, With biopsies Indications:              Iron deficiency anemia. Has been on regular diet                            for over 6 weeks with plenty of exposure to gluten Medicines:                Monitored Anesthesia Care Procedure:                Pre-Anesthesia Assessment:                           - Prior to the procedure, a History and Physical                            was performed, and patient medications and                            allergies were reviewed. The patient's tolerance of                            previous anesthesia was also reviewed. The risks                            and benefits of the procedure and the sedation                            options and risks were discussed with the patient.                            All questions were answered, and informed consent                            was obtained. Prior Anticoagulants: The patient has                            taken no previous anticoagulant or antiplatelet                            agents. ASA Grade Assessment: II - A patient with                            mild systemic disease. After reviewing the risks                            and benefits, the patient was deemed in                            satisfactory condition to undergo the procedure.  After obtaining informed consent, the endoscope was                            passed under direct vision. Throughout the                            procedure, the patient's blood pressure, pulse, and                            oxygen saturations were monitored continuously. The                            Model GIF-HQ190 [id number]) scope was introduced        through the mouth, and advanced to the second part                            of duodenum. The upper GI endoscopy was                            accomplished without difficulty. The patient                            tolerated the procedure well. Scope In: Scope Out: Findings:                 The esophagus was normal.                           The stomach was normal.                           The examined duodenum Revealed stenosis between the                            bulb and second portion consistent with a history                            of peptic ulcer disease previously. Biopsies for                            histology were taken with a cold forceps for                            evaluation of celiac disease.                           The cardia and gastric fundus were normal on                            retroflexion. Complications:            No immediate complications. Estimated Blood Loss:     Estimated blood loss: none. Impression:               - Normal esophagus.                           -  Normal stomach.                           - Duodenal stenosis suggesting prior history of                            optic ulcer disease.                           - No specimens collected. Recommendation:           - Patient has a contact number available for                            emergencies. The signs and symptoms of potential                            delayed complications were discussed with the                            patient. Return to normal activities tomorrow.                            Written discharge instructions were provided to the                            patient.                           - Resume previous diet.                           - Continue present medications.                           - Await pathology results.                           - Continue iron supplement once daily                           - Prescribe omeprazole 20 mg daily;  #30; 11                            refills. This medication will protect her stomach                            against ulcer formation as you are on chronic                            NSAIDs for arthritis. He should stay on this                            indefinitely Docia Chuck. Henrene Pastor, MD 03/11/2017 3:16:24 PM This report has been signed electronically.

## 2017-03-12 ENCOUNTER — Telehealth: Payer: Self-pay

## 2017-03-12 NOTE — Telephone Encounter (Signed)
  Follow up Call-  Call back number 03/11/2017  Post procedure Call Back phone  # [phone]- will prob have to LM per pt- he will not be awake   Permission to leave phone message Yes  Some recent data might be hidden     Patient questions:  Do you have a fever, pain , or abdominal swelling? No. Pain Score  0 *  Have you tolerated food without any problems? Yes.    Have you been able to return to your normal activities? Yes.    Do you have any questions about your discharge instructions: Diet   No. Medications  No. Follow up visit  No.  Do you have questions or concerns about your Care? No.  Actions: * If pain score is 4 or above: No action needed, pain <4.

## 2017-03-15 DIAGNOSIS — J3081 Allergic rhinitis due to animal (cat) (dog) hair and dander: Secondary | ICD-10-CM | POA: Diagnosis not present

## 2017-03-15 DIAGNOSIS — J301 Allergic rhinitis due to pollen: Secondary | ICD-10-CM | POA: Diagnosis not present

## 2017-03-15 DIAGNOSIS — J3089 Other allergic rhinitis: Secondary | ICD-10-CM | POA: Diagnosis not present

## 2017-03-16 ENCOUNTER — Encounter: Payer: Self-pay | Admitting: Internal Medicine

## 2017-03-26 DIAGNOSIS — T63441D Toxic effect of venom of bees, accidental (unintentional), subsequent encounter: Secondary | ICD-10-CM | POA: Diagnosis not present

## 2017-03-26 DIAGNOSIS — T63451D Toxic effect of venom of hornets, accidental (unintentional), subsequent encounter: Secondary | ICD-10-CM | POA: Diagnosis not present

## 2017-03-26 DIAGNOSIS — T63461D Toxic effect of venom of wasps, accidental (unintentional), subsequent encounter: Secondary | ICD-10-CM | POA: Diagnosis not present

## 2017-03-29 DIAGNOSIS — J3089 Other allergic rhinitis: Secondary | ICD-10-CM | POA: Diagnosis not present

## 2017-03-29 DIAGNOSIS — J301 Allergic rhinitis due to pollen: Secondary | ICD-10-CM | POA: Diagnosis not present

## 2017-03-29 DIAGNOSIS — J3081 Allergic rhinitis due to animal (cat) (dog) hair and dander: Secondary | ICD-10-CM | POA: Diagnosis not present

## 2017-04-09 DIAGNOSIS — J3089 Other allergic rhinitis: Secondary | ICD-10-CM | POA: Diagnosis not present

## 2017-04-09 DIAGNOSIS — J3081 Allergic rhinitis due to animal (cat) (dog) hair and dander: Secondary | ICD-10-CM | POA: Diagnosis not present

## 2017-04-09 DIAGNOSIS — J301 Allergic rhinitis due to pollen: Secondary | ICD-10-CM | POA: Diagnosis not present

## 2017-04-14 DIAGNOSIS — J301 Allergic rhinitis due to pollen: Secondary | ICD-10-CM | POA: Diagnosis not present

## 2017-04-14 DIAGNOSIS — J3089 Other allergic rhinitis: Secondary | ICD-10-CM | POA: Diagnosis not present

## 2017-04-14 DIAGNOSIS — J3081 Allergic rhinitis due to animal (cat) (dog) hair and dander: Secondary | ICD-10-CM | POA: Diagnosis not present

## 2017-04-20 DIAGNOSIS — J301 Allergic rhinitis due to pollen: Secondary | ICD-10-CM | POA: Diagnosis not present

## 2017-04-20 DIAGNOSIS — J3081 Allergic rhinitis due to animal (cat) (dog) hair and dander: Secondary | ICD-10-CM | POA: Diagnosis not present

## 2017-04-20 DIAGNOSIS — J3089 Other allergic rhinitis: Secondary | ICD-10-CM | POA: Diagnosis not present

## 2017-04-26 DIAGNOSIS — R197 Diarrhea, unspecified: Secondary | ICD-10-CM | POA: Diagnosis not present

## 2017-04-26 DIAGNOSIS — A084 Viral intestinal infection, unspecified: Secondary | ICD-10-CM | POA: Diagnosis not present

## 2017-04-26 DIAGNOSIS — R1084 Generalized abdominal pain: Secondary | ICD-10-CM | POA: Diagnosis not present

## 2017-04-26 DIAGNOSIS — E1122 Type 2 diabetes mellitus with diabetic chronic kidney disease: Secondary | ICD-10-CM | POA: Diagnosis not present

## 2017-04-26 DIAGNOSIS — R112 Nausea with vomiting, unspecified: Secondary | ICD-10-CM | POA: Diagnosis not present

## 2017-04-26 DIAGNOSIS — R1904 Left lower quadrant abdominal swelling, mass and lump: Secondary | ICD-10-CM | POA: Diagnosis not present

## 2017-04-29 DIAGNOSIS — J3089 Other allergic rhinitis: Secondary | ICD-10-CM | POA: Diagnosis not present

## 2017-04-29 DIAGNOSIS — J301 Allergic rhinitis due to pollen: Secondary | ICD-10-CM | POA: Diagnosis not present

## 2017-04-29 DIAGNOSIS — J3081 Allergic rhinitis due to animal (cat) (dog) hair and dander: Secondary | ICD-10-CM | POA: Diagnosis not present

## 2017-05-07 DIAGNOSIS — J3089 Other allergic rhinitis: Secondary | ICD-10-CM | POA: Diagnosis not present

## 2017-05-07 DIAGNOSIS — J301 Allergic rhinitis due to pollen: Secondary | ICD-10-CM | POA: Diagnosis not present

## 2017-05-07 DIAGNOSIS — J3081 Allergic rhinitis due to animal (cat) (dog) hair and dander: Secondary | ICD-10-CM | POA: Diagnosis not present

## 2017-05-10 DIAGNOSIS — J301 Allergic rhinitis due to pollen: Secondary | ICD-10-CM | POA: Diagnosis not present

## 2017-05-10 DIAGNOSIS — J3081 Allergic rhinitis due to animal (cat) (dog) hair and dander: Secondary | ICD-10-CM | POA: Diagnosis not present

## 2017-05-10 DIAGNOSIS — J3089 Other allergic rhinitis: Secondary | ICD-10-CM | POA: Diagnosis not present

## 2017-05-12 DIAGNOSIS — H35711 Central serous chorioretinopathy, right eye: Secondary | ICD-10-CM | POA: Diagnosis not present

## 2017-05-12 DIAGNOSIS — E1122 Type 2 diabetes mellitus with diabetic chronic kidney disease: Secondary | ICD-10-CM | POA: Diagnosis not present

## 2017-05-12 DIAGNOSIS — E785 Hyperlipidemia, unspecified: Secondary | ICD-10-CM | POA: Diagnosis not present

## 2017-05-12 DIAGNOSIS — H2512 Age-related nuclear cataract, left eye: Secondary | ICD-10-CM | POA: Diagnosis not present

## 2017-05-12 DIAGNOSIS — E559 Vitamin D deficiency, unspecified: Secondary | ICD-10-CM | POA: Diagnosis not present

## 2017-05-12 DIAGNOSIS — Z961 Presence of intraocular lens: Secondary | ICD-10-CM | POA: Diagnosis not present

## 2017-05-12 DIAGNOSIS — Z9841 Cataract extraction status, right eye: Secondary | ICD-10-CM | POA: Diagnosis not present

## 2017-05-12 DIAGNOSIS — D638 Anemia in other chronic diseases classified elsewhere: Secondary | ICD-10-CM | POA: Diagnosis not present

## 2017-05-13 DIAGNOSIS — N182 Chronic kidney disease, stage 2 (mild): Secondary | ICD-10-CM | POA: Diagnosis not present

## 2017-05-13 DIAGNOSIS — E785 Hyperlipidemia, unspecified: Secondary | ICD-10-CM | POA: Diagnosis not present

## 2017-05-13 DIAGNOSIS — M459 Ankylosing spondylitis of unspecified sites in spine: Secondary | ICD-10-CM | POA: Diagnosis not present

## 2017-05-13 DIAGNOSIS — Z0001 Encounter for general adult medical examination with abnormal findings: Secondary | ICD-10-CM | POA: Diagnosis not present

## 2017-05-17 DIAGNOSIS — J3081 Allergic rhinitis due to animal (cat) (dog) hair and dander: Secondary | ICD-10-CM | POA: Diagnosis not present

## 2017-05-17 DIAGNOSIS — J301 Allergic rhinitis due to pollen: Secondary | ICD-10-CM | POA: Diagnosis not present

## 2017-05-17 DIAGNOSIS — J3089 Other allergic rhinitis: Secondary | ICD-10-CM | POA: Diagnosis not present

## 2017-05-21 DIAGNOSIS — J3089 Other allergic rhinitis: Secondary | ICD-10-CM | POA: Diagnosis not present

## 2017-05-21 DIAGNOSIS — J3081 Allergic rhinitis due to animal (cat) (dog) hair and dander: Secondary | ICD-10-CM | POA: Diagnosis not present

## 2017-05-21 DIAGNOSIS — J301 Allergic rhinitis due to pollen: Secondary | ICD-10-CM | POA: Diagnosis not present

## 2017-05-24 DIAGNOSIS — J3081 Allergic rhinitis due to animal (cat) (dog) hair and dander: Secondary | ICD-10-CM | POA: Diagnosis not present

## 2017-05-24 DIAGNOSIS — J301 Allergic rhinitis due to pollen: Secondary | ICD-10-CM | POA: Diagnosis not present

## 2017-05-24 DIAGNOSIS — J3089 Other allergic rhinitis: Secondary | ICD-10-CM | POA: Diagnosis not present

## 2017-05-26 DIAGNOSIS — J3089 Other allergic rhinitis: Secondary | ICD-10-CM | POA: Diagnosis not present

## 2017-05-26 DIAGNOSIS — J3081 Allergic rhinitis due to animal (cat) (dog) hair and dander: Secondary | ICD-10-CM | POA: Diagnosis not present

## 2017-05-26 DIAGNOSIS — J301 Allergic rhinitis due to pollen: Secondary | ICD-10-CM | POA: Diagnosis not present

## 2017-05-28 DIAGNOSIS — J301 Allergic rhinitis due to pollen: Secondary | ICD-10-CM | POA: Diagnosis not present

## 2017-05-28 DIAGNOSIS — J3089 Other allergic rhinitis: Secondary | ICD-10-CM | POA: Diagnosis not present

## 2017-05-28 DIAGNOSIS — J3081 Allergic rhinitis due to animal (cat) (dog) hair and dander: Secondary | ICD-10-CM | POA: Diagnosis not present

## 2017-05-31 DIAGNOSIS — Z961 Presence of intraocular lens: Secondary | ICD-10-CM | POA: Diagnosis not present

## 2017-05-31 DIAGNOSIS — H2512 Age-related nuclear cataract, left eye: Secondary | ICD-10-CM | POA: Diagnosis not present

## 2017-05-31 DIAGNOSIS — Z01818 Encounter for other preprocedural examination: Secondary | ICD-10-CM | POA: Diagnosis not present

## 2017-06-04 DIAGNOSIS — J3089 Other allergic rhinitis: Secondary | ICD-10-CM | POA: Diagnosis not present

## 2017-06-04 DIAGNOSIS — J301 Allergic rhinitis due to pollen: Secondary | ICD-10-CM | POA: Diagnosis not present

## 2017-06-04 DIAGNOSIS — J3081 Allergic rhinitis due to animal (cat) (dog) hair and dander: Secondary | ICD-10-CM | POA: Diagnosis not present

## 2017-06-08 DIAGNOSIS — Z961 Presence of intraocular lens: Secondary | ICD-10-CM | POA: Diagnosis not present

## 2017-06-08 DIAGNOSIS — H25812 Combined forms of age-related cataract, left eye: Secondary | ICD-10-CM | POA: Diagnosis not present

## 2017-06-08 DIAGNOSIS — E119 Type 2 diabetes mellitus without complications: Secondary | ICD-10-CM | POA: Diagnosis not present

## 2017-06-09 DIAGNOSIS — H35711 Central serous chorioretinopathy, right eye: Secondary | ICD-10-CM | POA: Diagnosis not present

## 2017-06-09 DIAGNOSIS — Z961 Presence of intraocular lens: Secondary | ICD-10-CM | POA: Diagnosis not present

## 2017-06-09 DIAGNOSIS — Z48810 Encounter for surgical aftercare following surgery on the sense organs: Secondary | ICD-10-CM | POA: Diagnosis not present

## 2017-06-09 DIAGNOSIS — Z9842 Cataract extraction status, left eye: Secondary | ICD-10-CM | POA: Diagnosis not present

## 2017-06-09 DIAGNOSIS — Z9841 Cataract extraction status, right eye: Secondary | ICD-10-CM | POA: Diagnosis not present

## 2017-06-11 DIAGNOSIS — J3081 Allergic rhinitis due to animal (cat) (dog) hair and dander: Secondary | ICD-10-CM | POA: Diagnosis not present

## 2017-06-11 DIAGNOSIS — J301 Allergic rhinitis due to pollen: Secondary | ICD-10-CM | POA: Diagnosis not present

## 2017-06-11 DIAGNOSIS — J3089 Other allergic rhinitis: Secondary | ICD-10-CM | POA: Diagnosis not present

## 2017-06-17 DIAGNOSIS — J3089 Other allergic rhinitis: Secondary | ICD-10-CM | POA: Diagnosis not present

## 2017-06-17 DIAGNOSIS — J3081 Allergic rhinitis due to animal (cat) (dog) hair and dander: Secondary | ICD-10-CM | POA: Diagnosis not present

## 2017-06-17 DIAGNOSIS — J301 Allergic rhinitis due to pollen: Secondary | ICD-10-CM | POA: Diagnosis not present

## 2017-06-21 DIAGNOSIS — J3089 Other allergic rhinitis: Secondary | ICD-10-CM | POA: Diagnosis not present

## 2017-06-21 DIAGNOSIS — J3081 Allergic rhinitis due to animal (cat) (dog) hair and dander: Secondary | ICD-10-CM | POA: Diagnosis not present

## 2017-06-21 DIAGNOSIS — J301 Allergic rhinitis due to pollen: Secondary | ICD-10-CM | POA: Diagnosis not present

## 2017-06-28 DIAGNOSIS — J3089 Other allergic rhinitis: Secondary | ICD-10-CM | POA: Diagnosis not present

## 2017-06-28 DIAGNOSIS — J3081 Allergic rhinitis due to animal (cat) (dog) hair and dander: Secondary | ICD-10-CM | POA: Diagnosis not present

## 2017-06-28 DIAGNOSIS — J301 Allergic rhinitis due to pollen: Secondary | ICD-10-CM | POA: Diagnosis not present

## 2017-07-12 DIAGNOSIS — J301 Allergic rhinitis due to pollen: Secondary | ICD-10-CM | POA: Diagnosis not present

## 2017-07-12 DIAGNOSIS — J3089 Other allergic rhinitis: Secondary | ICD-10-CM | POA: Diagnosis not present

## 2017-07-12 DIAGNOSIS — J3081 Allergic rhinitis due to animal (cat) (dog) hair and dander: Secondary | ICD-10-CM | POA: Diagnosis not present

## 2017-07-26 DIAGNOSIS — J301 Allergic rhinitis due to pollen: Secondary | ICD-10-CM | POA: Diagnosis not present

## 2017-07-26 DIAGNOSIS — J3089 Other allergic rhinitis: Secondary | ICD-10-CM | POA: Diagnosis not present

## 2017-07-26 DIAGNOSIS — J3081 Allergic rhinitis due to animal (cat) (dog) hair and dander: Secondary | ICD-10-CM | POA: Diagnosis not present

## 2017-08-04 DIAGNOSIS — Z791 Long term (current) use of non-steroidal anti-inflammatories (NSAID): Secondary | ICD-10-CM | POA: Diagnosis not present

## 2017-08-04 DIAGNOSIS — M459 Ankylosing spondylitis of unspecified sites in spine: Secondary | ICD-10-CM | POA: Diagnosis not present

## 2017-08-04 DIAGNOSIS — H209 Unspecified iridocyclitis: Secondary | ICD-10-CM | POA: Diagnosis not present

## 2017-08-04 DIAGNOSIS — M25511 Pain in right shoulder: Secondary | ICD-10-CM | POA: Diagnosis not present

## 2017-08-10 DIAGNOSIS — J3081 Allergic rhinitis due to animal (cat) (dog) hair and dander: Secondary | ICD-10-CM | POA: Diagnosis not present

## 2017-08-10 DIAGNOSIS — J3089 Other allergic rhinitis: Secondary | ICD-10-CM | POA: Diagnosis not present

## 2017-08-10 DIAGNOSIS — J301 Allergic rhinitis due to pollen: Secondary | ICD-10-CM | POA: Diagnosis not present

## 2017-08-20 DIAGNOSIS — J3089 Other allergic rhinitis: Secondary | ICD-10-CM | POA: Diagnosis not present

## 2017-08-20 DIAGNOSIS — J3081 Allergic rhinitis due to animal (cat) (dog) hair and dander: Secondary | ICD-10-CM | POA: Diagnosis not present

## 2017-08-20 DIAGNOSIS — J301 Allergic rhinitis due to pollen: Secondary | ICD-10-CM | POA: Diagnosis not present

## 2017-09-07 DIAGNOSIS — D509 Iron deficiency anemia, unspecified: Secondary | ICD-10-CM | POA: Diagnosis not present

## 2017-09-09 DIAGNOSIS — J301 Allergic rhinitis due to pollen: Secondary | ICD-10-CM | POA: Diagnosis not present

## 2017-09-09 DIAGNOSIS — J3089 Other allergic rhinitis: Secondary | ICD-10-CM | POA: Diagnosis not present

## 2017-09-09 DIAGNOSIS — J3081 Allergic rhinitis due to animal (cat) (dog) hair and dander: Secondary | ICD-10-CM | POA: Diagnosis not present

## 2017-09-16 DIAGNOSIS — D509 Iron deficiency anemia, unspecified: Secondary | ICD-10-CM | POA: Diagnosis not present

## 2017-09-16 DIAGNOSIS — E119 Type 2 diabetes mellitus without complications: Secondary | ICD-10-CM | POA: Diagnosis not present

## 2017-09-21 DIAGNOSIS — J301 Allergic rhinitis due to pollen: Secondary | ICD-10-CM | POA: Diagnosis not present

## 2017-09-21 DIAGNOSIS — J3089 Other allergic rhinitis: Secondary | ICD-10-CM | POA: Diagnosis not present

## 2017-09-21 DIAGNOSIS — J3081 Allergic rhinitis due to animal (cat) (dog) hair and dander: Secondary | ICD-10-CM | POA: Diagnosis not present

## 2017-10-05 DIAGNOSIS — J301 Allergic rhinitis due to pollen: Secondary | ICD-10-CM | POA: Diagnosis not present

## 2017-10-05 DIAGNOSIS — J3089 Other allergic rhinitis: Secondary | ICD-10-CM | POA: Diagnosis not present

## 2017-10-05 DIAGNOSIS — J3081 Allergic rhinitis due to animal (cat) (dog) hair and dander: Secondary | ICD-10-CM | POA: Diagnosis not present

## 2017-10-18 DIAGNOSIS — J3081 Allergic rhinitis due to animal (cat) (dog) hair and dander: Secondary | ICD-10-CM | POA: Diagnosis not present

## 2017-10-18 DIAGNOSIS — J3089 Other allergic rhinitis: Secondary | ICD-10-CM | POA: Diagnosis not present

## 2017-10-18 DIAGNOSIS — J301 Allergic rhinitis due to pollen: Secondary | ICD-10-CM | POA: Diagnosis not present

## 2017-10-19 IMAGING — DX DG PORTABLE PELVIS
1 series · 1 of 1 positions shown · non-contrast
Comparison: CT 09/10/2016

CLINICAL DATA: S/P RIGHT HIP REPLACEMENT

EXAM:
PORTABLE PELVIS 1-2 VIEWS

[pelvis ap]
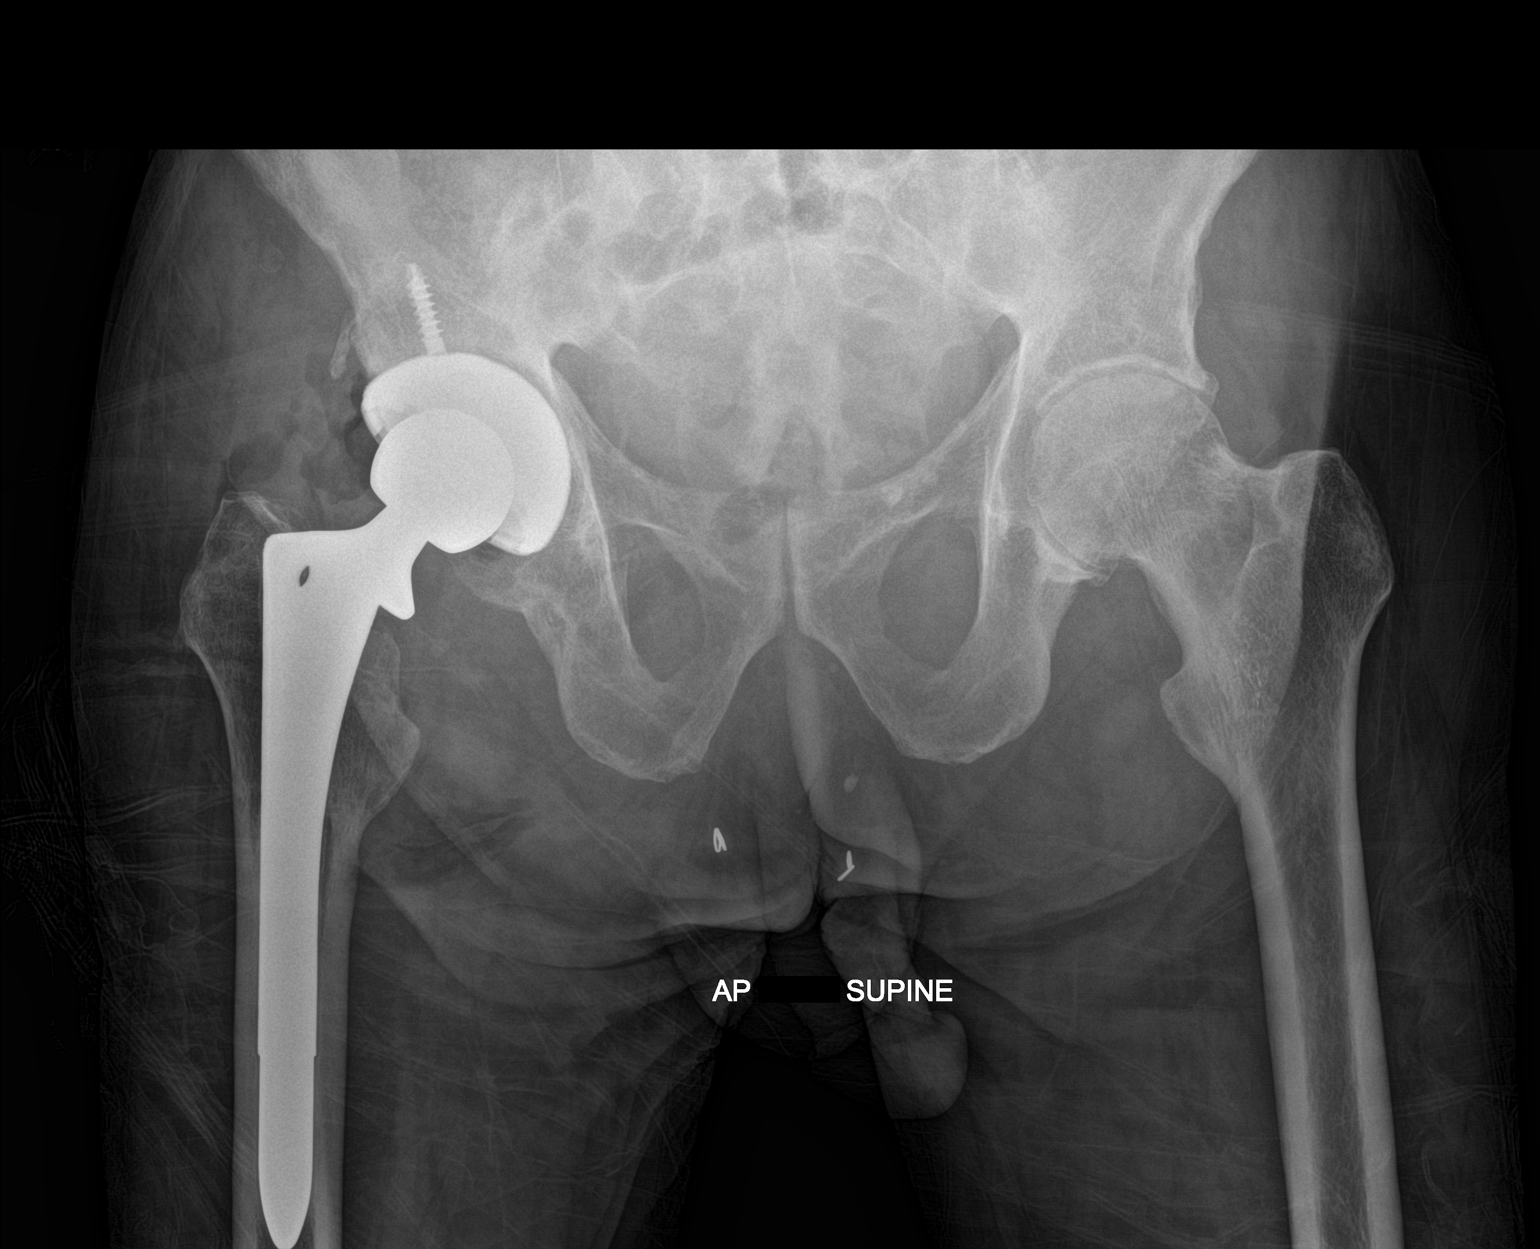

[1 of 1 positions shown; findings below may reference images not displayed]

FINDINGS: Interval revision of right hip arthroplasty components, which
project in expected location. No fracture or dislocation although
the distal tip of the femoral component is not visualized. Mild
narrowing of the articular cartilage in the left hip with marginal
spurring as before. Vasectomy clips. Fusion across bilateral
sacroiliac joints as before.
IMPRESSION: 1. Right hip arthroplasty revision without apparent complication

## 2017-11-01 DIAGNOSIS — J3089 Other allergic rhinitis: Secondary | ICD-10-CM | POA: Diagnosis not present

## 2017-11-01 DIAGNOSIS — J3081 Allergic rhinitis due to animal (cat) (dog) hair and dander: Secondary | ICD-10-CM | POA: Diagnosis not present

## 2017-11-01 DIAGNOSIS — D649 Anemia, unspecified: Secondary | ICD-10-CM | POA: Diagnosis not present

## 2017-11-01 DIAGNOSIS — J301 Allergic rhinitis due to pollen: Secondary | ICD-10-CM | POA: Diagnosis not present

## 2017-11-01 DIAGNOSIS — D638 Anemia in other chronic diseases classified elsewhere: Secondary | ICD-10-CM | POA: Diagnosis not present

## 2017-11-02 DIAGNOSIS — J3089 Other allergic rhinitis: Secondary | ICD-10-CM | POA: Diagnosis not present

## 2017-11-02 DIAGNOSIS — J301 Allergic rhinitis due to pollen: Secondary | ICD-10-CM | POA: Diagnosis not present

## 2017-11-02 DIAGNOSIS — J3081 Allergic rhinitis due to animal (cat) (dog) hair and dander: Secondary | ICD-10-CM | POA: Diagnosis not present

## 2017-11-04 IMAGING — CT CT ABD-PELV W/ CM
2 of 5 series · 16 of 46 positions shown, 18 images · IV contrast (ISOVUE)
Comparison: 03/21/2015

CLINICAL DATA: Abdominal pain with diarrhea

EXAM:
CT ABDOMEN AND PELVIS WITH CONTRAST
TECHNIQUE: Multidetector CT imaging of the abdomen and pelvis was performed
using the standard protocol following bolus administration of
intravenous contrast.
CONTRAST:  75mL B1YK3S-C11 IOPAMIDOL (B1YK3S-C11) INJECTION 61%

[Series 2: abd/pel with · axial · 0.76mm/px · z∈[-714,-379]mm · 13 of 79 slices shown, 15 images]
[im 6/79  soft-tissue]
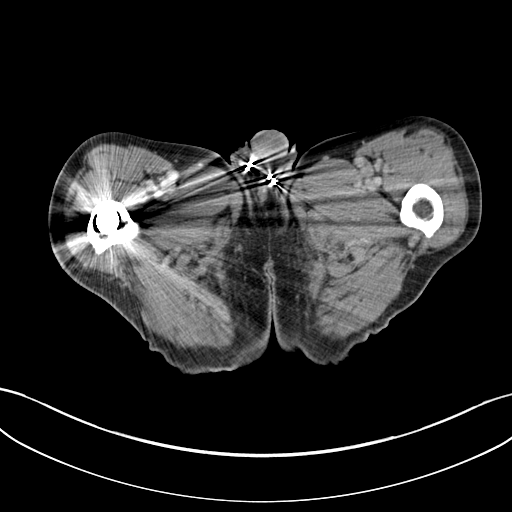
[im 6/79  bone]
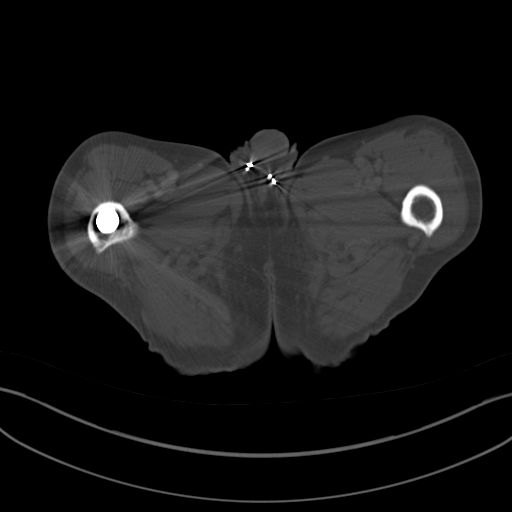
[im 12/79  soft-tissue]
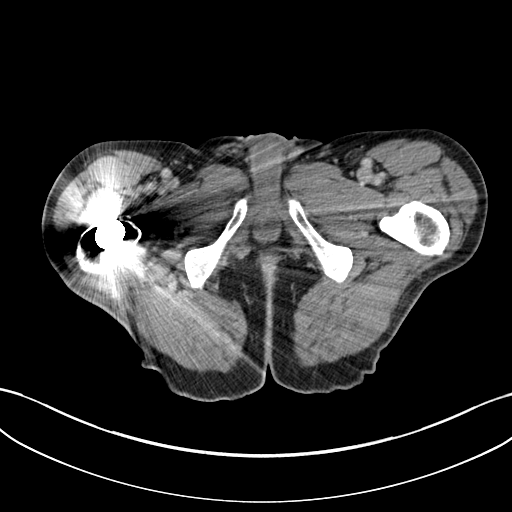
[im 17/79  soft-tissue]
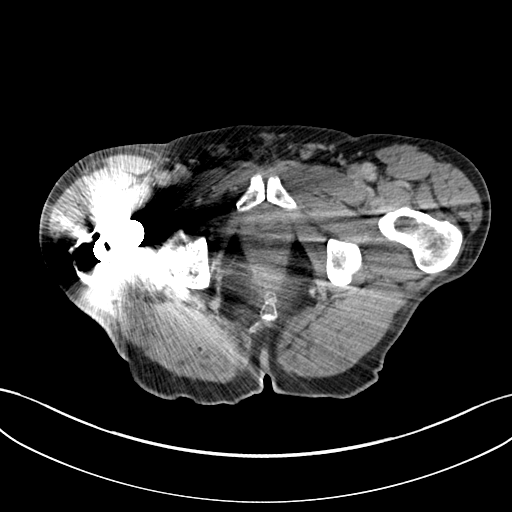
[im 23/79  soft-tissue]
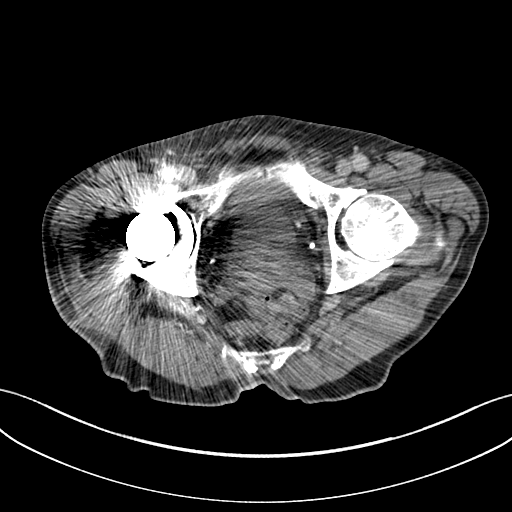
[im 28/79  soft-tissue]
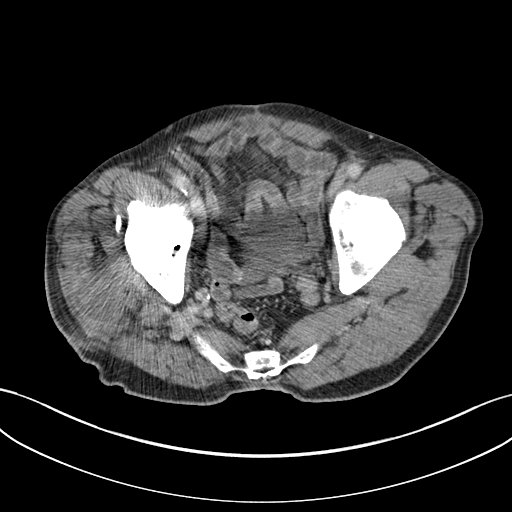
[im 34/79  soft-tissue]
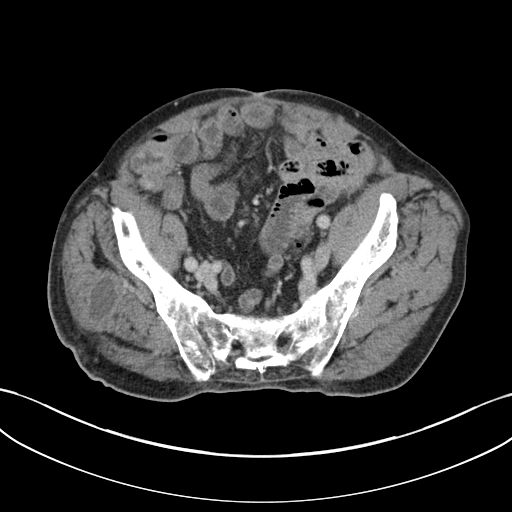
[im 40/79  soft-tissue]
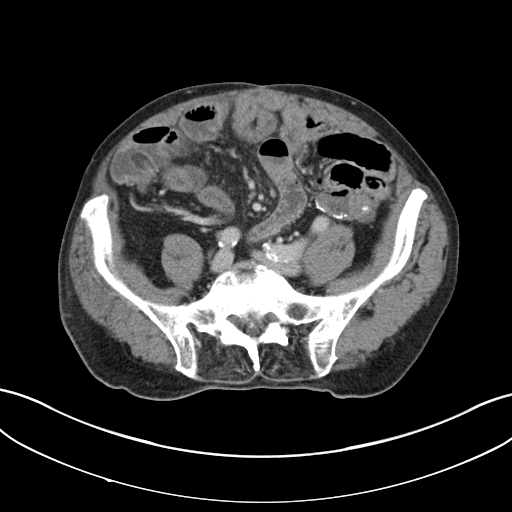
[im 45/79  soft-tissue]
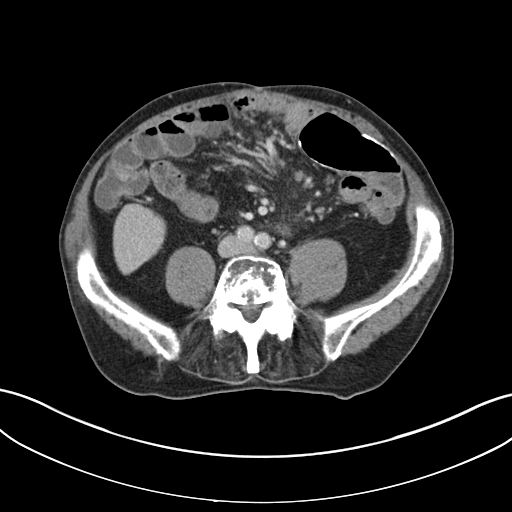
[im 51/79  soft-tissue]
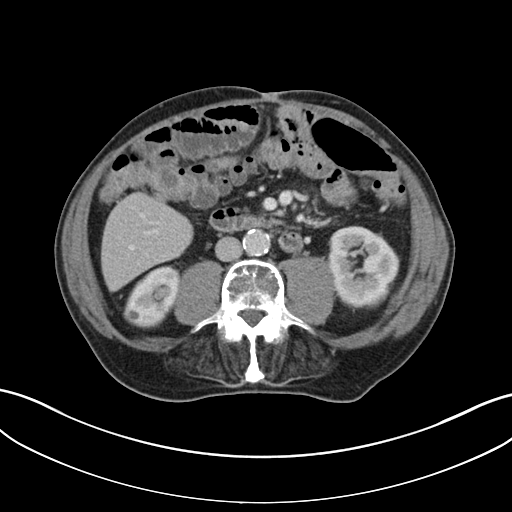
[im 51/79  bone]
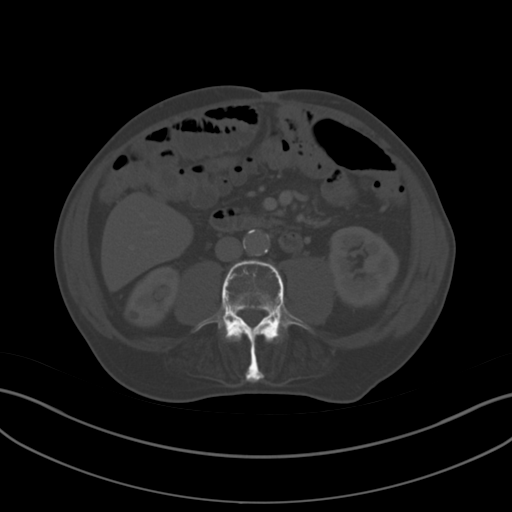
[im 56/79  soft-tissue]
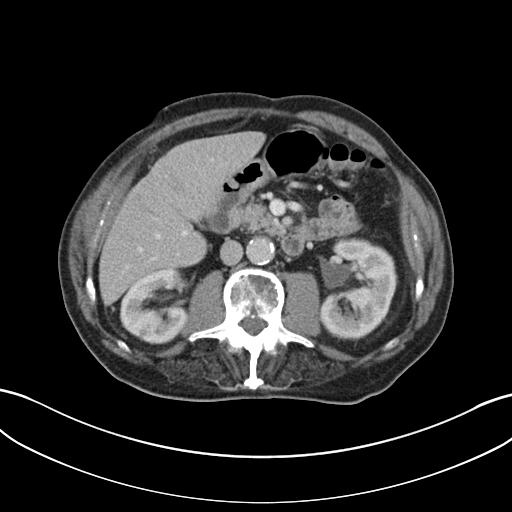
[im 62/79  soft-tissue]
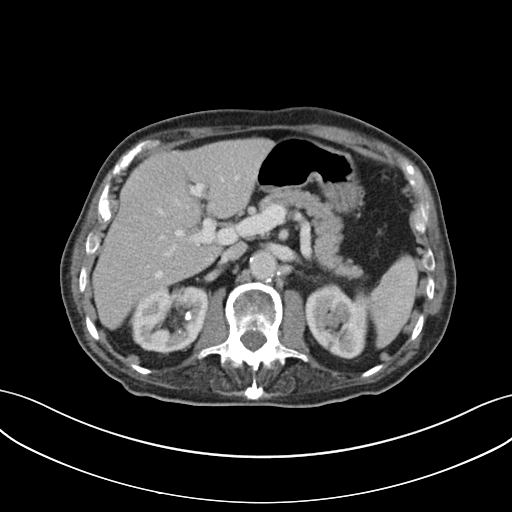
[im 67/79  soft-tissue]
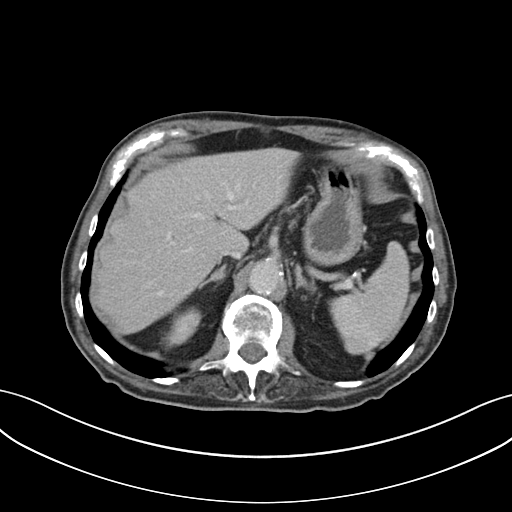
[im 73/79  soft-tissue]
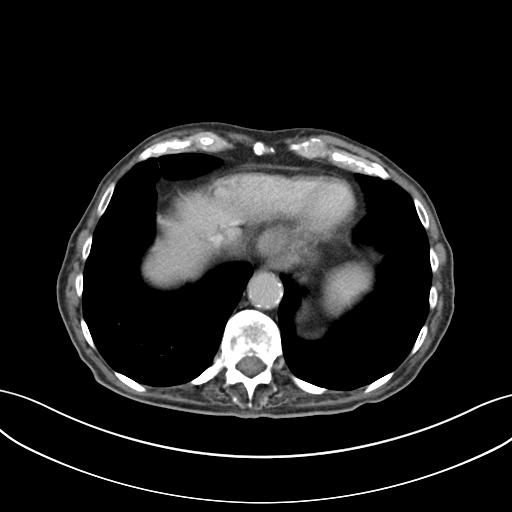

[Series 5: coronal a/|p · coronal · 0.74mm/px · 3 of 190 slices shown]
[im 64/190  soft-tissue]
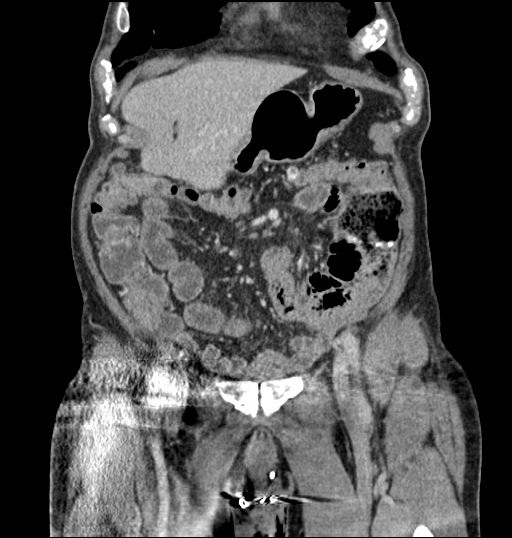
[im 85/190  soft-tissue]
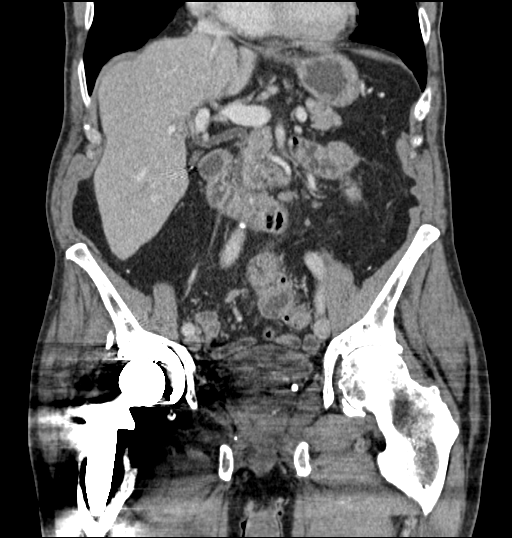
[im 106/190  soft-tissue]
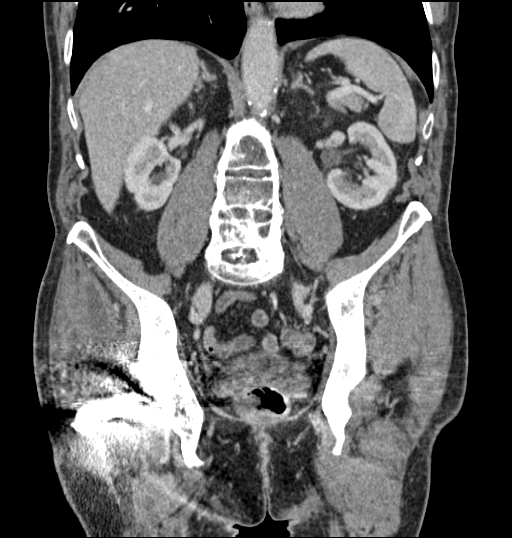

[16 of 46 positions shown; findings below may reference images not displayed]

FINDINGS: Lower chest: Lung bases demonstrate mild scarring in the right
middle lobe. No acute consolidation or pleural effusion is seen. The
heart is nonenlarged.

Hepatobiliary: No focal liver abnormality is seen. Status post
cholecystectomy. No biliary dilatation.

Pancreas: Unremarkable. No pancreatic ductal dilatation or
surrounding inflammatory changes.

Spleen: Normal in size without focal abnormality.

Adrenals/Urinary Tract: Adrenal glands are within normal limits.
Subcentimeter hypodensities within the kidneys too small to further
characterize, not significantly change in probably representing
small cysts. Multiple intrarenal stones bilaterally. Largest on the
right is seen in the upper pole and measures 7 mm. Negative for
hydronephrosis. Bladder within normal limits allowing for streak
artifact

Stomach/Bowel: Stomach nonenlarged. No bowel wall thickening.
Postsurgical changes of the bowel in the left abdomen with slightly
dilated segments of small bowel at the anastomosis but without
convincing evidence for an obstruction. No additional dilated loops
of bowel seen. Appendix not well identified. Scattered colon
diverticula without acute inflammation.

Vascular/Lymphatic: Moderate atherosclerosis of the aorta. No
aneurysm is seen. No significantly enlarged abdominal or pelvic
lymph nodes.

Reproductive: Enlarged appearing prostate as before.

Other: No free air or free fluid.

Musculoskeletal: Status post right hip replacement. Ankylosis of the
spine.
IMPRESSION: 1. Postsurgical changes of the bowel in the left hemiabdomen but
without convincing evidence for bowel obstruction at this time. No
bowel wall thickening is seen.
2. Intrarenal calculi bilaterally.  Negative for hydronephrosis
3. Enlarged appearing prostate
4. Bony changes consistent with ankylosing spondylitis.
5. Colon diverticular changes without acute inflammation

## 2017-11-09 ENCOUNTER — Telehealth: Payer: Self-pay | Admitting: Internal Medicine

## 2017-11-09 ENCOUNTER — Encounter: Payer: Self-pay | Admitting: Oncology

## 2017-11-09 ENCOUNTER — Telehealth: Payer: Self-pay | Admitting: Oncology

## 2017-11-09 NOTE — Telephone Encounter (Signed)
Tried to reach regarding voicemail °

## 2017-11-09 NOTE — Telephone Encounter (Signed)
New hematology referral received from Dr. Shelia Media for dx of anemia. Pt has been scheduled to see Mikey Bussing on 8/20 at 1pm. Pt aware to arrive 30 minutes early. Letter mailed.

## 2017-11-10 DIAGNOSIS — Z1212 Encounter for screening for malignant neoplasm of rectum: Secondary | ICD-10-CM | POA: Diagnosis not present

## 2017-11-15 DIAGNOSIS — D638 Anemia in other chronic diseases classified elsewhere: Secondary | ICD-10-CM | POA: Diagnosis not present

## 2017-11-15 DIAGNOSIS — D649 Anemia, unspecified: Secondary | ICD-10-CM | POA: Diagnosis not present

## 2017-11-15 DIAGNOSIS — J3089 Other allergic rhinitis: Secondary | ICD-10-CM | POA: Diagnosis not present

## 2017-11-15 DIAGNOSIS — J301 Allergic rhinitis due to pollen: Secondary | ICD-10-CM | POA: Diagnosis not present

## 2017-11-15 DIAGNOSIS — J3081 Allergic rhinitis due to animal (cat) (dog) hair and dander: Secondary | ICD-10-CM | POA: Diagnosis not present

## 2017-11-16 ENCOUNTER — Telehealth: Payer: Self-pay | Admitting: Oncology

## 2017-11-16 NOTE — Telephone Encounter (Signed)
I was unable to reach the pt to reschedule appt with Mikey Bussing. Will try back later.

## 2017-11-17 ENCOUNTER — Telehealth: Payer: Self-pay | Admitting: Oncology

## 2017-11-17 ENCOUNTER — Encounter: Payer: Self-pay | Admitting: Oncology

## 2017-11-17 NOTE — Telephone Encounter (Signed)
New hematology appointment has been rescheduled for the pt to see Mikey Bussing on 8/27 at 1pm. i've been unable to contact the pt. I mailed the pt a new letter with the updated appt date and time.

## 2017-11-18 ENCOUNTER — Telehealth: Payer: Self-pay | Admitting: Oncology

## 2017-11-18 NOTE — Telephone Encounter (Signed)
Patient called in 8/7 @ 12:347 to confirm appt for 8/27

## 2017-11-29 DIAGNOSIS — J3081 Allergic rhinitis due to animal (cat) (dog) hair and dander: Secondary | ICD-10-CM | POA: Diagnosis not present

## 2017-11-29 DIAGNOSIS — J301 Allergic rhinitis due to pollen: Secondary | ICD-10-CM | POA: Diagnosis not present

## 2017-11-29 DIAGNOSIS — E785 Hyperlipidemia, unspecified: Secondary | ICD-10-CM | POA: Diagnosis not present

## 2017-11-29 DIAGNOSIS — J3089 Other allergic rhinitis: Secondary | ICD-10-CM | POA: Diagnosis not present

## 2017-11-30 ENCOUNTER — Encounter: Payer: Medicare Other | Admitting: Oncology

## 2017-12-01 DIAGNOSIS — D509 Iron deficiency anemia, unspecified: Secondary | ICD-10-CM | POA: Diagnosis not present

## 2017-12-01 DIAGNOSIS — R197 Diarrhea, unspecified: Secondary | ICD-10-CM | POA: Diagnosis not present

## 2017-12-06 DIAGNOSIS — D500 Iron deficiency anemia secondary to blood loss (chronic): Secondary | ICD-10-CM | POA: Diagnosis not present

## 2017-12-07 ENCOUNTER — Inpatient Hospital Stay: Payer: Medicare Other | Attending: Oncology | Admitting: Oncology

## 2017-12-07 ENCOUNTER — Other Ambulatory Visit: Payer: Self-pay

## 2017-12-07 ENCOUNTER — Inpatient Hospital Stay: Payer: Medicare Other

## 2017-12-07 ENCOUNTER — Encounter: Payer: Self-pay | Admitting: Oncology

## 2017-12-07 ENCOUNTER — Telehealth: Payer: Self-pay | Admitting: Oncology

## 2017-12-07 VITALS — BP 127/68 | HR 66 | Temp 98.2°F | Resp 18 | Ht 66.0 in | Wt 141.1 lb

## 2017-12-07 DIAGNOSIS — Z8719 Personal history of other diseases of the digestive system: Secondary | ICD-10-CM | POA: Diagnosis not present

## 2017-12-07 DIAGNOSIS — D649 Anemia, unspecified: Secondary | ICD-10-CM

## 2017-12-07 DIAGNOSIS — K56609 Unspecified intestinal obstruction, unspecified as to partial versus complete obstruction: Secondary | ICD-10-CM | POA: Insufficient documentation

## 2017-12-07 DIAGNOSIS — Z79899 Other long term (current) drug therapy: Secondary | ICD-10-CM | POA: Diagnosis not present

## 2017-12-07 DIAGNOSIS — M549 Dorsalgia, unspecified: Secondary | ICD-10-CM

## 2017-12-07 DIAGNOSIS — M459 Ankylosing spondylitis of unspecified sites in spine: Secondary | ICD-10-CM | POA: Diagnosis not present

## 2017-12-07 DIAGNOSIS — Z803 Family history of malignant neoplasm of breast: Secondary | ICD-10-CM | POA: Diagnosis not present

## 2017-12-07 DIAGNOSIS — R5383 Other fatigue: Secondary | ICD-10-CM | POA: Diagnosis not present

## 2017-12-07 DIAGNOSIS — Z87442 Personal history of urinary calculi: Secondary | ICD-10-CM | POA: Insufficient documentation

## 2017-12-07 DIAGNOSIS — E785 Hyperlipidemia, unspecified: Secondary | ICD-10-CM | POA: Insufficient documentation

## 2017-12-07 DIAGNOSIS — Z800 Family history of malignant neoplasm of digestive organs: Secondary | ICD-10-CM

## 2017-12-07 DIAGNOSIS — Z87891 Personal history of nicotine dependence: Secondary | ICD-10-CM | POA: Insufficient documentation

## 2017-12-07 DIAGNOSIS — R040 Epistaxis: Secondary | ICD-10-CM | POA: Insufficient documentation

## 2017-12-07 DIAGNOSIS — F329 Major depressive disorder, single episode, unspecified: Secondary | ICD-10-CM | POA: Diagnosis not present

## 2017-12-07 DIAGNOSIS — D509 Iron deficiency anemia, unspecified: Secondary | ICD-10-CM | POA: Diagnosis not present

## 2017-12-07 DIAGNOSIS — Z8041 Family history of malignant neoplasm of ovary: Secondary | ICD-10-CM | POA: Diagnosis not present

## 2017-12-07 LAB — CBC WITH DIFFERENTIAL (CANCER CENTER ONLY)
BASOS ABS: 0 10*3/uL (ref 0.0–0.1)
Basophils Relative: 1 %
Eosinophils Absolute: 0.2 10*3/uL (ref 0.0–0.5)
Eosinophils Relative: 3 %
HEMATOCRIT: 36.6 % — AB (ref 38.4–49.9)
HEMOGLOBIN: 11.9 g/dL — AB (ref 13.0–17.1)
LYMPHS PCT: 14 %
Lymphs Abs: 1.1 10*3/uL (ref 0.9–3.3)
MCH: 29.7 pg (ref 27.2–33.4)
MCHC: 32.4 g/dL (ref 32.0–36.0)
MCV: 91.4 fL (ref 79.3–98.0)
MONO ABS: 0.5 10*3/uL (ref 0.1–0.9)
MONOS PCT: 7 %
NEUTROS ABS: 6.1 10*3/uL (ref 1.5–6.5)
Neutrophils Relative %: 75 %
Platelet Count: 352 10*3/uL (ref 140–400)
RBC: 4.01 MIL/uL — ABNORMAL LOW (ref 4.20–5.82)
RDW: 17.5 % — ABNORMAL HIGH (ref 11.0–14.6)
WBC: 8 10*3/uL (ref 4.0–10.3)

## 2017-12-07 LAB — CMP (CANCER CENTER ONLY)
ALK PHOS: 73 U/L (ref 38–126)
ALT: 20 U/L (ref 0–44)
AST: 25 U/L (ref 15–41)
Albumin: 3.7 g/dL (ref 3.5–5.0)
Anion gap: 7 (ref 5–15)
BILIRUBIN TOTAL: 0.4 mg/dL (ref 0.3–1.2)
BUN: 18 mg/dL (ref 8–23)
CALCIUM: 9.7 mg/dL (ref 8.9–10.3)
CO2: 31 mmol/L (ref 22–32)
Chloride: 103 mmol/L (ref 98–111)
Creatinine: 0.82 mg/dL (ref 0.61–1.24)
GFR, Est AFR Am: >60 mL/min (ref >60)
Glucose, Bld: 86 mg/dL (ref 70–99)
Potassium: 4.4 mmol/L (ref 3.5–5.1)
Sodium: 141 mmol/L (ref 135–145)
TOTAL PROTEIN: 7.3 g/dL (ref 6.5–8.1)

## 2017-12-07 LAB — VITAMIN B12: Vitamin B-12: 348 pg/mL (ref 180–914)

## 2017-12-07 LAB — FOLATE

## 2017-12-07 LAB — LACTATE DEHYDROGENASE: LDH: 156 U/L (ref 98–192)

## 2017-12-07 NOTE — Telephone Encounter (Signed)
Gave pt avs and calendar  °

## 2017-12-07 NOTE — Progress Notes (Signed)
[facility] [phone]   [phone]            CONSULT NOTE  REFERRING PHYSICIAN:  Dr. [name]  REASON FOR CONSULTATION: Anemia  HPI: Steven [name] is a 83 y.o. male with a past medical history including ankylosing spondylitis, depression, hyperlipidemia, and history of bowel obstruction status post abdominal surgery.  The patient is seen at the request of Dr. Shelia Media for anemia.  The patient reports that he has had anemia for a number of years.  He reports one blood transfusion in 1999 during surgery for a right hip replacement.  He denies any history of receiving IV iron.  He takes ferrous sulfate on a daily basis.  Review of lab work from his primary care provider showed a hemoglobin of 9.7 on 11/01/2017 which improved to 10.6 on 11/15/2017.  His hemoglobin was as high as 12.7 on 05/12/2017.  Stool for occult blood was checked x3 and was positive on 1 of 3 stool cards.  He was evaluated by gastroenterology yesterday and reports that he will undergo colonoscopy within the next week.   The patient reports fatigue and states that he naps in the afternoon on a regular basis.  He denies shortness of breath at rest and dyspnea on exertion.  Denies dizziness.  He reports that he has had nosebleeds in the past which have been evaluated by ENT.  The nosebleeds had stopped for several years but over the past 3 weeks he has had 3 nosebleeds.  He denies bleeding gums, hemoptysis, hematuria, melena.  He denies fevers and chills.  Denies chest pain, shortness of breath, cough.  Denies nausea, vomiting, constipation, diarrhea.  Denies recent weight loss or night sweats. His family history is significant for a mother who had diabetes, breast cancer, and ovarian cancer.  She died at age 43.  His father lived to be 74 and died of "old age."  He has 1 brother who had early stage pancreatic cancer and one brother with COPD.  Denies any family history of anemia or blood clots. The  patient is married with 2 biological children.  He is retired from the Audiological scientist.  He reports a history of tobacco use including one pack of cigarettes per day for 10 to 12 years.  He quit in the 1960s.  He reports that he drinks 21 beers per week.  No illicit drug use.   Past Medical History:  Diagnosis Date  . Allergy    enviromental  . Anemia 2017  . Anxiety   . Arthritis    ankylosing spodilitis  . Blood transfusion without reported diagnosis    during surgery  . Cataract   . Depression   . Dyspnea    with exertion - had Echo done 09/29/16  . History of kidney stones   . Hyperlipidemia   . Intestinal obstruction ([place])   . Pneumonia    as a child  :    Past Surgical History:  Procedure Laterality Date  . ABDOMINAL SURGERY     had abcess  . APPENDECTOMY    . CHOLECYSTECTOMY     with lysis of adhesions  . COLONOSCOPY    . COLOSTOMY    . COLOSTOMY REVERSAL    . EYE SURGERY Left    scar tissue removed from cornea  . EYE SURGERY Right    cataract surgery with lens implant  . JOINT REPLACEMENT Right    hip  x 2 1999 and  2007  . SHOULDER ARTHROSCOPY WITH ROTATOR CUFF REPAIR AND SUBACROMIAL DECOMPRESSION Left 03/02/2013   Procedure: LEFT SHOULDER ARTHROSCOPY WITH SUBACROMIAL DECOMPRESSION DISTAL CALVICLE RESECTION AND ROTATOR CUFF REPAIR ;  Surgeon: [name], MD;  Location: [facility];  Service: Orthopedics;  Laterality: Left;  . TOTAL HIP REVISION Right 11/06/2016   Procedure: TOTAL HIP REVISION OF THE ACETABULAR COMPONENT;  Surgeon: [name], MD;  Location: [facility];  Service: Orthopedics;  Laterality: Right;  :   CURRENT MEDS: Current Outpatient Medications  Medication Sig Dispense Refill  . Artificial Tear Solution (SOOTHE XP) SOLN Place 1 drop into both eyes 2 (two) times daily.    . Biotin 5 MG CAPS Take 1 capsule by mouth daily.    . Calcium Carbonate-Vitamin D 600-400 MG-UNIT tablet Take 1 tablet by mouth 2 (two) times daily.    . cholecalciferol  (VITAMIN D) 1000 units tablet Take 1,000 Units by mouth daily.    . citalopram (CELEXA) 40 MG tablet Take 40 mg by mouth daily.    . folic acid (FOLVITE) 1 MG tablet Take 1 mg by mouth daily.     . Iron-FA-B Cmp-C-Biot-Probiotic (FUSION PLUS) CAPS Take 1 tablet by mouth daily.   12  . methotrexate (RHEUMATREX) 2.5 MG tablet Take 25 mg by mouth once a week. Sunday  5  . Multiple Vitamins-Minerals (PRESERVISION AREDS 2 PO) Take 1 tablet by mouth 2 (two) times daily.    . prednisoLONE acetate (PRED FORTE) 1 % ophthalmic suspension Place 1 drop into the right eye 4 (four) times daily.  3  . Probiotic Product (PROBIOTIC PO) Take 1 capsule by mouth daily.    . simvastatin (ZOCOR) 5 MG tablet Take 10 mg by mouth at bedtime.     No current facility-administered medications for this visit.      Allergies  Allergen Reactions  . Indomethacin Hives  . Lactose Intolerance (Gi)     GI UPSET  :   Family History  Problem Relation Age of Onset  . Heart attack Mother   . Diabetes Mother   . Breast cancer Mother   . Heart attack Maternal Grandfather   . Pancreatic cancer Brother   . Colon cancer Neg Hx   . Esophageal cancer Neg Hx   . Stomach cancer Neg Hx   :   Social History   Socioeconomic History  . Marital status: Married    Spouse name: Not on file  . Number of children: 2  . Years of education: Not on file  . Highest education level: Not on file  Occupational History  . Occupation: retired  Scientific laboratory technician  . Financial resource strain: Not on file  . Food insecurity:    Worry: Not on file    Inability: Not on file  . Transportation needs:    Medical: Not on file    Non-medical: Not on file  Tobacco Use  . Smoking status: Former Research scientist (life sciences)  . Smokeless tobacco: Never Used  Substance and Sexual Activity  . Alcohol use: No    Comment: 3 beers or glasses of wine a day--reports stopped ETOH 11/01/16   . Drug use: No  . Sexual activity: Never  Lifestyle  . Physical activity:     Days per week: Not on file    Minutes per session: Not on file  . Stress: Not on file  Relationships  . Social connections:    Talks on phone: Not on file    Gets together: Not on  file    Attends religious service: Not on file    Active member of club or organization: Not on file    Attends meetings of clubs or organizations: Not on file    Relationship status: Not on file  . Intimate partner violence:    Fear of current or ex partner: Not on file    Emotionally abused: Not on file    Physically abused: Not on file    Forced sexual activity: Not on file  Other Topics Concern  . Not on file  Social History Narrative  . Not on file  :  REVIEW OF SYSTEMS:   Constitutional: Negative for appetite change, chills, fever and unexpected weight change.  Positive for mild fatigue. HENT:   Negative for mouth sores, sore throat and trouble swallowing.    Positive for 3 nosebleeds over the past 3 weeks. Eyes: Negative for eye problems and icterus.  Respiratory: Negative for cough, hemoptysis, shortness of breath and wheezing.   Cardiovascular: Negative for chest pain and leg swelling.  Gastrointestinal: Negative for abdominal pain, constipation, diarrhea, nausea and vomiting.  Genitourinary: Negative for bladder incontinence, difficulty urinating, dysuria, frequency and hematuria.   Musculoskeletal: Negative for back pain, gait problem, neck pain and neck stiffness.  Skin: Negative for itching and rash.  Neurological: Negative for dizziness, extremity weakness, gait problem, headaches, light-headedness and seizures.  Hematological: Negative for adenopathy. Does not bruise/bleed easily.  Psychiatric/Behavioral: Negative for confusion, depression and sleep disturbance. The patient is not nervous/anxious.     PHYSICAL EXAMINATION: Blood pressure 127/68, pulse 66, temperature 98.2 F (36.8 C), temperature source Oral, resp. rate 18, height _0  (1.676 m), weight 141 lb 1.6 oz (64 kg), SpO2 97  %.  ECOG PERFORMANCE STATUS: 1 - Symptomatic but completely ambulatory  Physical Exam  Constitutional: Oriented to person, place, and time and well-developed, well-nourished, and in no distress. No distress.  HENT:  Head: Normocephalic and atraumatic.  Mouth/Throat: Oropharynx is clear and moist. No oropharyngeal exudate.  Eyes: Conjunctivae are normal. Right eye exhibits no discharge. Left eye exhibits no discharge. No scleral icterus.  Neck: Normal range of motion. Neck supple.  Cardiovascular: Normal rate, regular rhythm, normal heart sounds and intact distal pulses.   Pulmonary/Chest: Effort normal and breath sounds normal. No respiratory distress. No wheezes. No rales.  Abdominal: Soft. Bowel sounds are normal. Exhibits no distension and no mass. There is no tenderness.  Musculoskeletal: Decreased range of motion to the neck and stooped posture. Exhibits no edema.  Lymphadenopathy:    No cervical adenopathy.  Neurological: Alert and oriented to person, place, and time. Exhibits normal muscle tone. Gait normal. Coordination normal.  Skin: Skin is warm and dry. No rash noted. Not diaphoretic. No erythema. No pallor.  Psychiatric: Mood, memory and judgment normal.  Vitals reviewed.    LABS:  Lab Results  Component Value Date   WBC 8.0 12/07/2017   HGB 11.9 (L) 12/07/2017   HCT 36.6 (L) 12/07/2017   PLT 352 12/07/2017   GLUCOSE 86 12/07/2017   ALT 20 12/07/2017   AST 25 12/07/2017   NA 141 12/07/2017   K 4.4 12/07/2017   CL 103 12/07/2017   CREATININE 0.82 12/07/2017   BUN 18 12/07/2017   CO2 31 12/07/2017   INR 1.00 11/03/2016   HGBA1C 4.6 (L) 11/03/2016    No results found.  ASSESSMENT: This is a pleasant 83 year old white male with normocytic, hypochromic anemia.  He had 1 of 3 stool cards positive for  occult blood.  Differentials include anemia due to iron deficiency and anemia due to chronic disease  PLAN: The patient was seen with Dr. Earlie Server.  We discussed his  recent lab findings and need for further work-up.  The patient was sent to our lab today to have a repeat CBC which showed a hemoglobin of 11.9, with a normal MCV and MCHC.  RDW was elevated.  His CMET is completely normal.  Additional lab work including an LDH, vitamin B12 level, folate level, ferritin, iron studies, and SPEP with immunofixation were drawn.  We discussed with the patient that we may need to discuss proceeding with a bone marrow biopsy if the cause for his anemia is not identified on his lab work today.  Recommend that he keep his follow-up appointment for his colonoscopy in the near future.  We will see him back for follow-up and discussion of his lab results in approximately 3 weeks.  He was advised to call immediately if she has any concerning symptoms in the interval. The patient voices understanding of current disease status and treatment options and is in agreement with the current care plan.   All questions were answered. The patient knows to call the clinic with any problems, questions or concerns. We can certainly see the patient much sooner if necessary.   Thank you so much for allowing me to participate in the care of [employer]. I will continue to follow up the patient with you and assist in hiscare.  Mikey Bussing, DNP, AGPCNP-BC, AOCNP  Orders Placed This Encounter  Procedures  . CBC with Differential (Cancer Center Only)    Standing Status:   Future    Number of Occurrences:   1    Standing Expiration Date:   12/08/2018  . CMP ([facility] only)    Standing Status:   Future    Number of Occurrences:   1    Standing Expiration Date:   12/08/2018  . Ferritin    Standing Status:   Future    Number of Occurrences:   1    Standing Expiration Date:   12/08/2018  . Iron and TIBC    Standing Status:   Future    Number of Occurrences:   1    Standing Expiration Date:   12/08/2018  . Vitamin B12    Standing Status:   Future    Number of Occurrences:   1     Standing Expiration Date:   12/07/2018  . Lactate dehydrogenase    Standing Status:   Future    Number of Occurrences:   1    Standing Expiration Date:   12/08/2018  . Folate, Serum    Standing Status:   Future    Number of Occurrences:   1    Standing Expiration Date:   12/07/2018  . SPEP with reflex to IFE    Standing Status:   Future    Number of Occurrences:   1    Standing Expiration Date:   12/07/2018   ADDENDUM: Hematology/Oncology Attending: I had a face-to-face encounter with the patient today.  I recommended his care plan.  This is a very pleasant 83 years old white male with persistent normocytic normochromic anemia likely anemia of chronic disease.  The patient is currently asymptomatic except for mild fatigue.  I had a lengthy discussion with the patient today about his condition.  I order several studies for further evaluation of his anemia including repeat CBC, complaints metabolic  panel, iron study, ferritin, serum folate, vitamin B12 as well as serum protein electrophoresis with immune fixation. We will see the patient back for follow-up visit in few weeks for evaluation and discussion of his lab results and further recommendation regarding his condition.  If the pending lab result were unremarkable for the etiology of his anemia, we may consider The patient for a bone marrow biopsy and aspirate. The patient was advised to call immediately if he has any concerning symptoms in the interval. Disclaimer: This note was dictated with voice recognition software. Similar sounding words can inadvertently be transcribed and may be missed upon review. Eilleen Kempf, MD 12/07/17

## 2017-12-08 DIAGNOSIS — M459 Ankylosing spondylitis of unspecified sites in spine: Secondary | ICD-10-CM | POA: Diagnosis not present

## 2017-12-08 DIAGNOSIS — Z791 Long term (current) use of non-steroidal anti-inflammatories (NSAID): Secondary | ICD-10-CM | POA: Diagnosis not present

## 2017-12-08 DIAGNOSIS — Z79899 Other long term (current) drug therapy: Secondary | ICD-10-CM | POA: Diagnosis not present

## 2017-12-08 DIAGNOSIS — M1990 Unspecified osteoarthritis, unspecified site: Secondary | ICD-10-CM | POA: Diagnosis not present

## 2017-12-08 DIAGNOSIS — H209 Unspecified iridocyclitis: Secondary | ICD-10-CM | POA: Diagnosis not present

## 2017-12-08 LAB — IRON AND TIBC
Iron: 76 ug/dL (ref 42–163)
SATURATION RATIOS: 21 % — AB (ref 42–163)
TIBC: 359 ug/dL (ref 202–409)
UIBC: 283 ug/dL

## 2017-12-08 LAB — FERRITIN: FERRITIN: 31 ng/mL (ref 24–336)

## 2017-12-09 DIAGNOSIS — D229 Melanocytic nevi, unspecified: Secondary | ICD-10-CM | POA: Diagnosis not present

## 2017-12-09 DIAGNOSIS — L570 Actinic keratosis: Secondary | ICD-10-CM | POA: Diagnosis not present

## 2017-12-09 DIAGNOSIS — L821 Other seborrheic keratosis: Secondary | ICD-10-CM | POA: Diagnosis not present

## 2017-12-09 LAB — PROTEIN ELECTROPHORESIS, SERUM, WITH REFLEX
A/G Ratio: 1.1 (ref 0.7–1.7)
Albumin ELP: 3.5 g/dL (ref 2.9–4.4)
Alpha-1-Globulin: 0.2 g/dL (ref 0.0–0.4)
Alpha-2-Globulin: 0.8 g/dL (ref 0.4–1.0)
BETA GLOBULIN: 1.2 g/dL (ref 0.7–1.3)
GAMMA GLOBULIN: 1 g/dL (ref 0.4–1.8)
GLOBULIN, TOTAL: 3.2 g/dL (ref 2.2–3.9)
M-SPIKE, %: 0.5 g/dL — AB
SPEP Interpretation: 0
Total Protein ELP: 6.7 g/dL (ref 6.0–8.5)

## 2017-12-09 LAB — IMMUNOFIXATION REFLEX, SERUM
IGA: 328 mg/dL (ref 61–437)
IgG (Immunoglobin G), Serum: 976 mg/dL (ref 700–1600)
IgM (Immunoglobulin M), Srm: 76 mg/dL (ref 15–143)

## 2017-12-29 DIAGNOSIS — J3089 Other allergic rhinitis: Secondary | ICD-10-CM | POA: Diagnosis not present

## 2017-12-29 DIAGNOSIS — J3081 Allergic rhinitis due to animal (cat) (dog) hair and dander: Secondary | ICD-10-CM | POA: Diagnosis not present

## 2017-12-29 DIAGNOSIS — J301 Allergic rhinitis due to pollen: Secondary | ICD-10-CM | POA: Diagnosis not present

## 2017-12-31 DIAGNOSIS — D649 Anemia, unspecified: Secondary | ICD-10-CM | POA: Insufficient documentation

## 2017-12-31 NOTE — Progress Notes (Signed)
[facility] OFFICE PROGRESS NOTE  [name], MD [place] [address]  DIAGNOSIS: normocytic, hypochromic anemia.    PRIOR THERAPY: None  CURRENT THERAPY: None  INTERVAL HISTORY: Steven [name] 83 y.o. male returns for routine follow-up visit by himself.  The patient is feeling fine today and has no specific complaints.  He denies fevers and chills.  Denies chest pain, shortness of breath, cough, hemoptysis.  Denies nausea, vomiting, constipation, diarrhea.  Denies bleeding.  Denies dizziness.  He was supposed to have a colonoscopy performed recently but he delayed this pending discussion of his recent lab work.  The patient is here for evaluation and discussion of his labs from his last visit.  MEDICAL HISTORY: Past Medical History:  Diagnosis Date  . Allergy    enviromental  . Anemia 2017  . Anxiety   . Arthritis    ankylosing spodilitis  . Blood transfusion without reported diagnosis    during surgery  . Cataract   . Depression   . Dyspnea    with exertion - had Echo done 09/29/16  . History of kidney stones   . Hyperlipidemia   . Intestinal obstruction ([place])   . Pneumonia    as a child    ALLERGIES:  is allergic to indomethacin and lactose intolerance (gi).  MEDICATIONS:  Current Outpatient Medications  Medication Sig Dispense Refill  . Artificial Tear Solution (SOOTHE XP) SOLN Place 1 drop into both eyes 2 (two) times daily.    . Biotin 5 MG CAPS Take 1 capsule by mouth daily.    . Calcium Carbonate-Vitamin D 600-400 MG-UNIT tablet Take 1 tablet by mouth 2 (two) times daily.    . cholecalciferol (VITAMIN D) 1000 units tablet Take 1,000 Units by mouth daily.    . citalopram (CELEXA) 40 MG tablet Take 40 mg by mouth daily.    . folic acid (FOLVITE) 1 MG tablet Take 1 mg by mouth daily.     . Iron-FA-B Cmp-C-Biot-Probiotic (FUSION PLUS) CAPS Take 1 tablet by mouth daily.   12  . methotrexate (RHEUMATREX) 2.5 MG tablet  Take 25 mg by mouth once a week. Sunday  5  . Multiple Vitamins-Minerals (PRESERVISION AREDS 2 PO) Take 1 tablet by mouth 2 (two) times daily.    . prednisoLONE acetate (PRED FORTE) 1 % ophthalmic suspension Place 1 drop into the right eye 4 (four) times daily.  3  . Probiotic Product (PROBIOTIC PO) Take 1 capsule by mouth daily.    . simvastatin (ZOCOR) 5 MG tablet Take 10 mg by mouth at bedtime.     No current facility-administered medications for this visit.     SURGICAL HISTORY:  Past Surgical History:  Procedure Laterality Date  . ABDOMINAL SURGERY     had abcess  . APPENDECTOMY    . CHOLECYSTECTOMY     with lysis of adhesions  . COLONOSCOPY    . COLOSTOMY    . COLOSTOMY REVERSAL    . EYE SURGERY Left    scar tissue removed from cornea  . EYE SURGERY Right    cataract surgery with lens implant  . JOINT REPLACEMENT Right    hip  x 2 1999 and 2007  . SHOULDER ARTHROSCOPY WITH ROTATOR CUFF REPAIR AND SUBACROMIAL DECOMPRESSION Left 03/02/2013   Procedure: LEFT SHOULDER ARTHROSCOPY WITH SUBACROMIAL DECOMPRESSION DISTAL CALVICLE RESECTION AND ROTATOR CUFF REPAIR ;  Surgeon: [name], MD;  Location: [facility];  Service: Orthopedics;  Laterality:  Left;  . TOTAL HIP REVISION Right 11/06/2016   Procedure: TOTAL HIP REVISION OF THE ACETABULAR COMPONENT;  Surgeon: [name], MD;  Location: [facility];  Service: Orthopedics;  Laterality: Right;    REVIEW OF SYSTEMS:   Review of Systems  Constitutional: Negative for appetite change, chills, fatigue, fever and unexpected weight change.  HENT:   Negative for mouth sores, nosebleeds, sore throat and trouble swallowing.   Eyes: Negative for eye problems and icterus.  Respiratory: Negative for cough, hemoptysis, shortness of breath and wheezing.   Cardiovascular: Negative for chest pain and leg swelling.  Gastrointestinal: Negative for abdominal pain, constipation, diarrhea, nausea and vomiting.  Genitourinary: Negative for bladder  incontinence, difficulty urinating, dysuria, frequency and hematuria.   Musculoskeletal: Negative for back pain, gait problem, neck pain and neck stiffness.  Skin: Negative for itching and rash.  Neurological: Negative for dizziness, extremity weakness, gait problem, headaches, light-headedness and seizures.  Hematological: Negative for adenopathy. Does not bruise/bleed easily.  Psychiatric/Behavioral: Negative for confusion, depression and sleep disturbance. The patient is not nervous/anxious.     PHYSICAL EXAMINATION:  Blood pressure 131/68, pulse 72, temperature 97.7 F (36.5 C), temperature source Oral, resp. rate 17, height 5\' 6"  (1.676 m), weight 140 lb 1.6 oz (63.5 kg), SpO2 99 %.  ECOG PERFORMANCE STATUS: 0 - Asymptomatic  Physical Exam  Constitutional: Oriented to person, place, and time and well-developed, well-nourished, and in no distress. No distress.  HENT:  Head: Normocephalic and atraumatic.  Mouth/Throat: Oropharynx is clear and moist. No oropharyngeal exudate.  Eyes: Conjunctivae are normal. Right eye exhibits no discharge. Left eye exhibits no discharge. No scleral icterus.  Neck: Normal range of motion. Neck supple.  Cardiovascular: Normal rate, regular rhythm, normal heart sounds and intact distal pulses.   Pulmonary/Chest: Effort normal and breath sounds normal. No respiratory distress. No wheezes. No rales.  Abdominal: Soft. Bowel sounds are normal. Exhibits no distension and no mass. There is no tenderness.  Musculoskeletal: Normal range of motion. Exhibits no edema.  Lymphadenopathy:    No cervical adenopathy.  Neurological: Alert and oriented to person, place, and time. Exhibits normal muscle tone. Gait normal. Coordination normal.  Skin: Skin is warm and dry. No rash noted. Not diaphoretic. No erythema. No pallor.  Psychiatric: Mood, memory and judgment normal.  Vitals reviewed.  LABORATORY DATA: Lab Results  Component Value Date   WBC 8.0 12/07/2017    HGB 11.9 (L) 12/07/2017   HCT 36.6 (L) 12/07/2017   MCV 91.4 12/07/2017   PLT 352 12/07/2017      Chemistry      Component Value Date/Time   NA 141 12/07/2017 1508   NA 141 04/20/2012 0425   K 4.4 12/07/2017 1508   K 4.1 04/20/2012 0425   CL 103 12/07/2017 1508   CL 109 (H) 04/20/2012 0425   CO2 31 12/07/2017 1508   CO2 28 04/20/2012 0425   BUN 18 12/07/2017 1508   BUN 14 04/20/2012 0425   CREATININE 0.82 12/07/2017 1508   CREATININE 0.95 04/20/2012 0425      Component Value Date/Time   CALCIUM 9.7 12/07/2017 1508   CALCIUM 8.2 (L) 04/20/2012 0425   ALKPHOS 73 12/07/2017 1508   ALKPHOS 40 (L) 04/20/2012 0425   AST 25 12/07/2017 1508   ALT 20 12/07/2017 1508   ALT 41 04/20/2012 0425   BILITOT 0.4 12/07/2017 1508     M spike 0.5, folate, greater than 100.0, iron 76, TIBC 359, percent saturation 21%, UIBC 283, ferritin  31, IgG 976, IgA 328, IgM 76, LDH 156,, vitamin B12 348  RADIOGRAPHIC STUDIES:  No results found.   ASSESSMENT/PLAN:  Anemia This is a pleasant 83 year old white male with normocytic, hypochromic anemia.  The patient had lab work performed on 12/07/2017 including a CBC, CMET, folate acid level, vitamin B12 level, SPEP with immunofixation, iron studies, ferritin, and LDH.  He is here to discuss the results.  The patient was seen with Dr. Julien Nordmann.  Lab results were reviewed with the patient which showed a slightly elevated M spike which is nonspecific.  His anemia has improved to a hemoglobin 11.9.  Recommend continued observation.  We will recheck his labs including a CBC, CMET, LDH, iron studies, ferritin, SPEP, and quantitative immunoglobulins in approximately 6 months.  He will follow-up 1 week after the labs to discuss the results.  Recommend that he continue to take oral iron.  We have also advised him to go ahead and proceed with his colonoscopy as previously scheduled.  He was advised to call immediately if he has any concerning symptoms in the  interval. The patient voices understanding of current disease status and treatment options and is in agreement with the current care plan.  All questions were answered. The patient knows to call the clinic with any problems, questions or concerns. We can certainly see the patient much sooner if necessary.   Orders Placed This Encounter  Procedures  . CBC with Differential (Cancer Center Only)    Standing Status:   Future    Standing Expiration Date:   01/04/2019  . CMP ([facility] only)    Standing Status:   Future    Standing Expiration Date:   01/04/2019  . Iron and TIBC    Standing Status:   Future    Standing Expiration Date:   01/04/2019  . Ferritin    Standing Status:   Future    Standing Expiration Date:   01/04/2019  . Lactate dehydrogenase    Standing Status:   Future    Standing Expiration Date:   01/04/2019  . IgG, IgA, IgM    Standing Status:   Future    Standing Expiration Date:   01/03/2019     Mikey Bussing, DNP, AGPCNP-BC, AOCNP 01/03/18   ADDENDUM: Hematology/Oncology Attending: I had a face-to-face encounter with the patient today.  I recommended his care plan.  This is a very pleasant 83 years old white male presented for evaluation of normocytic hypochromic anemia.  The patient had several blood work performed recently that showed no concerning findings except for observation of M spike on the serum protein electrophoresis.  His hemoglobin and hematocrit are better but he still have mild anemia. I recommended for the patient to continue on observation for now.  I will see him back for follow-up visit in 6 months for evaluation with repeat myeloma panel for evaluation of the M spike. He was advised to call immediately if he has any concerning symptoms in the interval.  Disclaimer: This note was dictated with voice recognition software. Similar sounding words can inadvertently be transcribed and may be missed upon review. Eilleen Kempf, MD 01/03/18

## 2017-12-31 NOTE — Assessment & Plan Note (Addendum)
This is a pleasant 83 year old white male with normocytic, hypochromic anemia.  The patient had lab work performed on 12/07/2017 including a CBC, CMET, folate acid level, vitamin B12 level, SPEP with immunofixation, iron studies, ferritin, and LDH.  He is here to discuss the results.  The patient was seen with Dr. Julien Nordmann.  Lab results were reviewed with the patient which showed a slightly elevated M spike which is nonspecific.  His anemia has improved to a hemoglobin 11.9.  Recommend continued observation.  We will recheck his labs including a CBC, CMET, LDH, iron studies, ferritin, SPEP, and quantitative immunoglobulins in approximately 6 months.  He will follow-up 1 week after the labs to discuss the results.  Recommend that he continue to take oral iron.  We have also advised him to go ahead and proceed with his colonoscopy as previously scheduled.  He was advised to call immediately if he has any concerning symptoms in the interval. The patient voices understanding of current disease status and treatment options and is in agreement with the current care plan.  All questions were answered. The patient knows to call the clinic with any problems, questions or concerns. We can certainly see the patient much sooner if necessary.

## 2018-01-03 ENCOUNTER — Telehealth: Payer: Self-pay | Admitting: Internal Medicine

## 2018-01-03 ENCOUNTER — Encounter: Payer: Self-pay | Admitting: Oncology

## 2018-01-03 ENCOUNTER — Inpatient Hospital Stay: Payer: Medicare Other | Attending: Oncology | Admitting: Oncology

## 2018-01-03 DIAGNOSIS — F418 Other specified anxiety disorders: Secondary | ICD-10-CM | POA: Diagnosis not present

## 2018-01-03 DIAGNOSIS — D649 Anemia, unspecified: Secondary | ICD-10-CM

## 2018-01-03 DIAGNOSIS — Z8701 Personal history of pneumonia (recurrent): Secondary | ICD-10-CM | POA: Diagnosis not present

## 2018-01-03 DIAGNOSIS — Z87442 Personal history of urinary calculi: Secondary | ICD-10-CM

## 2018-01-03 DIAGNOSIS — Z79899 Other long term (current) drug therapy: Secondary | ICD-10-CM | POA: Diagnosis not present

## 2018-01-03 DIAGNOSIS — D509 Iron deficiency anemia, unspecified: Secondary | ICD-10-CM | POA: Insufficient documentation

## 2018-01-03 DIAGNOSIS — E785 Hyperlipidemia, unspecified: Secondary | ICD-10-CM | POA: Diagnosis not present

## 2018-01-03 DIAGNOSIS — K5900 Constipation, unspecified: Secondary | ICD-10-CM | POA: Diagnosis not present

## 2018-01-03 NOTE — Telephone Encounter (Signed)
Appts scheduled AVS/Calendar printed per 9/23 los °

## 2018-01-04 DIAGNOSIS — H35711 Central serous chorioretinopathy, right eye: Secondary | ICD-10-CM | POA: Diagnosis not present

## 2018-01-04 DIAGNOSIS — M452 Ankylosing spondylitis of cervical region: Secondary | ICD-10-CM | POA: Diagnosis not present

## 2018-01-04 DIAGNOSIS — Z961 Presence of intraocular lens: Secondary | ICD-10-CM | POA: Diagnosis not present

## 2018-01-04 DIAGNOSIS — H44111 Panuveitis, right eye: Secondary | ICD-10-CM | POA: Diagnosis not present

## 2018-01-04 DIAGNOSIS — Z79899 Other long term (current) drug therapy: Secondary | ICD-10-CM | POA: Diagnosis not present

## 2018-01-11 ENCOUNTER — Ambulatory Visit: Payer: Medicare Other | Admitting: Internal Medicine

## 2018-01-11 ENCOUNTER — Encounter

## 2018-01-12 DIAGNOSIS — J3081 Allergic rhinitis due to animal (cat) (dog) hair and dander: Secondary | ICD-10-CM | POA: Diagnosis not present

## 2018-01-12 DIAGNOSIS — J301 Allergic rhinitis due to pollen: Secondary | ICD-10-CM | POA: Diagnosis not present

## 2018-01-12 DIAGNOSIS — J3089 Other allergic rhinitis: Secondary | ICD-10-CM | POA: Diagnosis not present

## 2018-01-12 DIAGNOSIS — R195 Other fecal abnormalities: Secondary | ICD-10-CM | POA: Diagnosis not present

## 2018-01-18 DIAGNOSIS — Z23 Encounter for immunization: Secondary | ICD-10-CM | POA: Diagnosis not present

## 2018-01-18 DIAGNOSIS — D509 Iron deficiency anemia, unspecified: Secondary | ICD-10-CM | POA: Diagnosis not present

## 2018-01-19 DIAGNOSIS — J3089 Other allergic rhinitis: Secondary | ICD-10-CM | POA: Diagnosis not present

## 2018-01-19 DIAGNOSIS — J3081 Allergic rhinitis due to animal (cat) (dog) hair and dander: Secondary | ICD-10-CM | POA: Diagnosis not present

## 2018-01-19 DIAGNOSIS — T63441A Toxic effect of venom of bees, accidental (unintentional), initial encounter: Secondary | ICD-10-CM | POA: Diagnosis not present

## 2018-01-19 DIAGNOSIS — J301 Allergic rhinitis due to pollen: Secondary | ICD-10-CM | POA: Diagnosis not present

## 2018-01-20 DIAGNOSIS — Z01818 Encounter for other preprocedural examination: Secondary | ICD-10-CM | POA: Diagnosis not present

## 2018-01-20 DIAGNOSIS — D500 Iron deficiency anemia secondary to blood loss (chronic): Secondary | ICD-10-CM | POA: Diagnosis not present

## 2018-02-03 DIAGNOSIS — D500 Iron deficiency anemia secondary to blood loss (chronic): Secondary | ICD-10-CM | POA: Diagnosis not present

## 2018-04-17 ENCOUNTER — Other Ambulatory Visit: Payer: Self-pay

## 2018-04-17 ENCOUNTER — Emergency Department
Admission: EM | Admit: 2018-04-17 | Discharge: 2018-04-17 | Disposition: A | Discharge status: Home / Self Care | Payer: Medicare Other | Attending: Emergency Medicine | Admitting: Emergency Medicine

## 2018-04-17 DIAGNOSIS — G501 Atypical facial pain: Secondary | ICD-10-CM | POA: Diagnosis present

## 2018-04-17 DIAGNOSIS — Z79899 Other long term (current) drug therapy: Secondary | ICD-10-CM | POA: Diagnosis not present

## 2018-04-17 DIAGNOSIS — Z87891 Personal history of nicotine dependence: Secondary | ICD-10-CM | POA: Insufficient documentation

## 2018-04-17 DIAGNOSIS — J00 Acute nasopharyngitis [common cold]: Secondary | ICD-10-CM | POA: Insufficient documentation

## 2018-04-17 MED ORDER — AZITHROMYCIN 250 MG PO TABS: ORAL_TABLET | ORAL | 0 refills | Status: DC

## 2018-04-17 NOTE — ED Notes (Signed)
Pt states he is here for an antibiotic for sinusitis. Pt states he has has nasal and chest congestion for about 5 days, pt denies fevers, states he has been taking Mucinex with some relief. Pt states his cough is productive at times. Pt states he really just wants an antibiotic for his sxs.

## 2018-04-17 NOTE — ED Provider Notes (Signed)
[facility] Emergency Department Provider Note ____________________________________________  Time seen: 1530  I have reviewed the triage vital signs and the nursing notes.  HISTORY  Chief Complaint  Facial Pain   HPI Steven [name] is a 84 y.o. male presents to the ER today with complaint of sinus pressure, runny nose, nasal congestion and cough.  He reports this started 5 days ago.  He is blowing yellow mucus out of his nose.  The cough is productive of green mucus.  He denies headache, ear pain or sore throat.  He denies fever, chills or body aches.  He has tried Mucinex with minimal relief.  He has a history of allergies but denies breathing problems.  He has not had sick contacts.  He is up-to-date on his flu and pneumonia vaccines.  Past Medical History:  Diagnosis Date  . Allergy    enviromental  . Anemia 2017  . Anxiety   . Arthritis    ankylosing spodilitis  . Blood transfusion without reported diagnosis    during surgery  . Cataract   . Depression   . Dyspnea    with exertion - had Echo done 09/29/16  . History of kidney stones   . Hyperlipidemia   . Intestinal obstruction ([place])   . Pneumonia    as a child    Patient Active Problem List   Diagnosis Date Noted  . Anemia 12/31/2017  . Failed arthroplasty ([place]) 11/06/2016  . 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 Dyspnea on exertion 09/11/2016  . SBO (small bowel obstruction) ([place]) 03/21/2015  . Abdominal pain 04/10/2014  . Bowel obstruction ([place]) 04/10/2014  . Hyperlipidemia 04/10/2014  . History of ankylosing spondylitis 04/10/2014    Past Surgical History:  Procedure Laterality Date  . ABDOMINAL SURGERY     had abcess  . APPENDECTOMY    . CHOLECYSTECTOMY     with lysis of adhesions  . COLONOSCOPY    . COLOSTOMY    . COLOSTOMY REVERSAL    . EYE SURGERY Left    scar tissue removed from cornea  . EYE SURGERY Right    cataract surgery with lens implant  . JOINT  REPLACEMENT Right    hip  x 2 1999 and 2007  . SHOULDER ARTHROSCOPY WITH ROTATOR CUFF REPAIR AND SUBACROMIAL DECOMPRESSION Left 03/02/2013   Procedure: LEFT SHOULDER ARTHROSCOPY WITH SUBACROMIAL DECOMPRESSION DISTAL CALVICLE RESECTION AND ROTATOR CUFF REPAIR ;  Surgeon: [name], MD;  Location: [facility];  Service: Orthopedics;  Laterality: Left;  . TOTAL HIP REVISION Right 11/06/2016   Procedure: TOTAL HIP REVISION OF THE ACETABULAR COMPONENT;  Surgeon: [name], MD;  Location: [facility];  Service: Orthopedics;  Laterality: Right;    Prior to Admission medications   Medication Sig Start Date End Date Taking? Authorizing Provider  Artificial Tear Solution (SOOTHE XP) SOLN Place 1 drop into both eyes 2 (two) times daily.    [provider]  azithromycin (ZITHROMAX) 250 MG tablet Take 2 tabs today, then 1 tab daily x 4 days 04/17/18   Jearld Fenton, NP  Biotin 5 MG CAPS Take 1 capsule by mouth daily.    [provider]  Calcium Carbonate-Vitamin D 600-400 MG-UNIT tablet Take 1 tablet by mouth 2 (two) times daily.    [provider]  cholecalciferol (VITAMIN D) 1000 units tablet Take 1,000 Units by mouth daily.    [provider]  citalopram (CELEXA) 40 MG tablet Take 40 mg by  mouth daily.    [provider]  folic acid (FOLVITE) 1 MG tablet Take 1 mg by mouth daily.     [provider]  Iron-FA-B Cmp-C-Biot-Probiotic (FUSION PLUS) CAPS Take 1 tablet by mouth daily.  09/14/16   [provider]  methotrexate (RHEUMATREX) 2.5 MG tablet Take 25 mg by mouth once a week. Sunday 09/15/16   [provider]  Multiple Vitamins-Minerals (PRESERVISION AREDS 2 PO) Take 1 tablet by mouth 2 (two) times daily.    [provider]  prednisoLONE acetate (PRED FORTE) 1 % ophthalmic suspension Place 1 drop into the right eye 4 (four) times daily. 12/25/16   [provider]  Probiotic Product (PROBIOTIC PO) Take 1 capsule by mouth  daily.    [provider]  simvastatin (ZOCOR) 5 MG tablet Take 10 mg by mouth at bedtime.    [provider]    Allergies Indomethacin and Lactose intolerance (gi)  Family History  Problem Relation Age of Onset  . Heart attack Mother   . Diabetes Mother   . Breast cancer Mother   . Heart attack Maternal Grandfather   . Pancreatic cancer Brother   . Colon cancer Neg Hx   . Esophageal cancer Neg Hx   . Stomach cancer Neg Hx     Social History Social History   Tobacco Use  . Smoking status: Former Research scientist (life sciences)  . Smokeless tobacco: Never Used  Substance Use Topics  . Alcohol use: Yes    Comment: 3 beers or glasses of wine a day--reports stopped ETOH 11/01/16   . Drug use: No    Review of Systems  Constitutional: Negative for fever, chills or body aches. ENT: Positive for runny nose, nasal congestion.  Negative for ear pain or sore throat. Cardiovascular: Negative for chest pain. Respiratory: Positive for cough.  Negative for shortness of breath.  ____________________________________________  PHYSICAL EXAM:  VITAL SIGNS: ED Triage Vitals  Enc Vitals Group     BP 04/17/18 1458 (!) 134/108     Pulse Rate 04/17/18 1458 (!) 57     Resp 04/17/18 1458 16     Temp 04/17/18 1457 98 F (36.7 C)     Temp Source 04/17/18 1457 Oral     SpO2 04/17/18 1458 95 %     Weight 04/17/18 1458 139 lb (63 kg)     Height 04/17/18 1458 5\' 6"  (1.676 m)     Head Circumference --      Peak Flow --      Pain Score 04/17/18 1457 0     Pain Loc --      Pain Edu? --      Excl. in [place]? --     Constitutional: Alert and oriented. Well appearing and in no distress. Head: Normocephalic without sinus tenderness. Nose: No congestion/rhinorrhea/epistaxis. Mouth/Throat: Mucous membranes are moist.  No posterior pharynx erythema or exudate noted. Hematological/Lymphatic/Immunological: No cervical lymphadenopathy. Cardiovascular: Retrocardiac, regular rhythm.  Respiratory: Normal  respiratory effort. No wheezes/rales/rhonchi. Neurologic:   Normal speech and language. No gross focal neurologic deficits are appreciated.  INITIAL IMPRESSION / ASSESSMENT AND PLAN / ED COURSE  Sinus Pressure, Runny Nose, Nasal Congestion, Cough:  DDX include viral sinusitis, bacterial sinusitis, upper respiratory infection, viral URI with cough Exam most c/w URI Will treat with Azithromax x 5 days Recommend Flonase OTC Recommend Delsym as needed for cough ____________________________________________  FINAL CLINICAL IMPRESSION(S) / ED DIAGNOSES  Final diagnoses:  Acute nasopharyngitis   Webb Silversmith, NP  Jearld Fenton, NP 04/17/18 1548    Nance Pear, MD 04/17/18 [phone]

## 2018-04-17 NOTE — ED Triage Notes (Signed)
States sinus pain x 5 days. Congested. A&O, ambulatory. Denies fever.

## 2018-04-17 NOTE — Discharge Instructions (Addendum)
You have been diagnosed with an upper respiratory infection.  I am providing you with a prescription to take as directed for the next 5 days.  You can also take Flonase over-the-counter as directed for nasal congestion and Delsym over-the-counter as needed for cough.  Follow-up with your PCP if symptoms persist or worsen.

## 2018-05-12 DIAGNOSIS — D649 Anemia, unspecified: Secondary | ICD-10-CM | POA: Diagnosis not present

## 2018-05-12 DIAGNOSIS — E785 Hyperlipidemia, unspecified: Secondary | ICD-10-CM | POA: Diagnosis not present

## 2018-05-19 DIAGNOSIS — M459 Ankylosing spondylitis of unspecified sites in spine: Secondary | ICD-10-CM | POA: Diagnosis not present

## 2018-05-19 DIAGNOSIS — E785 Hyperlipidemia, unspecified: Secondary | ICD-10-CM | POA: Diagnosis not present

## 2018-05-19 DIAGNOSIS — D649 Anemia, unspecified: Secondary | ICD-10-CM | POA: Diagnosis not present

## 2018-05-19 DIAGNOSIS — Z0000 Encounter for general adult medical examination without abnormal findings: Secondary | ICD-10-CM | POA: Diagnosis not present

## 2018-05-23 DIAGNOSIS — D649 Anemia, unspecified: Secondary | ICD-10-CM | POA: Diagnosis not present

## 2018-06-07 DIAGNOSIS — M545 Low back pain: Secondary | ICD-10-CM | POA: Diagnosis not present

## 2018-06-07 DIAGNOSIS — M25511 Pain in right shoulder: Secondary | ICD-10-CM | POA: Diagnosis not present

## 2018-06-07 DIAGNOSIS — Z791 Long term (current) use of non-steroidal anti-inflammatories (NSAID): Secondary | ICD-10-CM | POA: Diagnosis not present

## 2018-06-07 DIAGNOSIS — N182 Chronic kidney disease, stage 2 (mild): Secondary | ICD-10-CM | POA: Diagnosis not present

## 2018-06-16 DIAGNOSIS — L578 Other skin changes due to chronic exposure to nonionizing radiation: Secondary | ICD-10-CM | POA: Diagnosis not present

## 2018-06-16 DIAGNOSIS — L821 Other seborrheic keratosis: Secondary | ICD-10-CM | POA: Diagnosis not present

## 2018-06-16 DIAGNOSIS — L814 Other melanin hyperpigmentation: Secondary | ICD-10-CM | POA: Diagnosis not present

## 2018-06-16 DIAGNOSIS — L570 Actinic keratosis: Secondary | ICD-10-CM | POA: Diagnosis not present

## 2018-06-28 DIAGNOSIS — H35711 Central serous chorioretinopathy, right eye: Secondary | ICD-10-CM | POA: Diagnosis not present

## 2018-06-28 DIAGNOSIS — Z79899 Other long term (current) drug therapy: Secondary | ICD-10-CM | POA: Diagnosis not present

## 2018-06-28 DIAGNOSIS — H44111 Panuveitis, right eye: Secondary | ICD-10-CM | POA: Diagnosis not present

## 2018-06-28 DIAGNOSIS — Z961 Presence of intraocular lens: Secondary | ICD-10-CM | POA: Diagnosis not present

## 2018-06-28 DIAGNOSIS — M452 Ankylosing spondylitis of cervical region: Secondary | ICD-10-CM | POA: Diagnosis not present

## 2018-06-29 DIAGNOSIS — Z79899 Other long term (current) drug therapy: Secondary | ICD-10-CM | POA: Diagnosis not present

## 2018-06-29 DIAGNOSIS — H44111 Panuveitis, right eye: Secondary | ICD-10-CM | POA: Diagnosis not present

## 2018-06-29 DIAGNOSIS — Z5181 Encounter for therapeutic drug level monitoring: Secondary | ICD-10-CM | POA: Diagnosis not present

## 2018-07-04 ENCOUNTER — Inpatient Hospital Stay: Payer: Medicare Other | Attending: Internal Medicine

## 2018-07-04 DIAGNOSIS — M459 Ankylosing spondylitis of unspecified sites in spine: Secondary | ICD-10-CM | POA: Diagnosis not present

## 2018-07-04 DIAGNOSIS — Z79899 Other long term (current) drug therapy: Secondary | ICD-10-CM | POA: Diagnosis not present

## 2018-07-04 DIAGNOSIS — M1990 Unspecified osteoarthritis, unspecified site: Secondary | ICD-10-CM | POA: Diagnosis not present

## 2018-07-04 DIAGNOSIS — D649 Anemia, unspecified: Secondary | ICD-10-CM | POA: Diagnosis not present

## 2018-07-04 DIAGNOSIS — H209 Unspecified iridocyclitis: Secondary | ICD-10-CM | POA: Diagnosis not present

## 2018-07-11 ENCOUNTER — Inpatient Hospital Stay: Payer: Medicare Other | Admitting: Internal Medicine

## 2018-07-18 DIAGNOSIS — M545 Low back pain: Secondary | ICD-10-CM | POA: Diagnosis not present

## 2018-07-18 DIAGNOSIS — Z79899 Other long term (current) drug therapy: Secondary | ICD-10-CM | POA: Diagnosis not present

## 2018-07-18 DIAGNOSIS — D649 Anemia, unspecified: Secondary | ICD-10-CM | POA: Diagnosis not present

## 2018-07-18 DIAGNOSIS — M25511 Pain in right shoulder: Secondary | ICD-10-CM | POA: Diagnosis not present

## 2018-07-19 DIAGNOSIS — Z79899 Other long term (current) drug therapy: Secondary | ICD-10-CM | POA: Diagnosis not present

## 2018-07-19 DIAGNOSIS — D649 Anemia, unspecified: Secondary | ICD-10-CM | POA: Diagnosis not present

## 2018-08-08 DIAGNOSIS — D509 Iron deficiency anemia, unspecified: Secondary | ICD-10-CM | POA: Diagnosis not present

## 2018-08-08 DIAGNOSIS — R109 Unspecified abdominal pain: Secondary | ICD-10-CM | POA: Diagnosis not present

## 2018-08-08 DIAGNOSIS — Z8719 Personal history of other diseases of the digestive system: Secondary | ICD-10-CM | POA: Diagnosis not present

## 2018-08-08 DIAGNOSIS — E1122 Type 2 diabetes mellitus with diabetic chronic kidney disease: Secondary | ICD-10-CM | POA: Diagnosis not present

## 2018-08-26 DIAGNOSIS — M1712 Unilateral primary osteoarthritis, left knee: Secondary | ICD-10-CM | POA: Diagnosis not present

## 2018-08-26 DIAGNOSIS — M1990 Unspecified osteoarthritis, unspecified site: Secondary | ICD-10-CM | POA: Diagnosis not present

## 2018-08-26 DIAGNOSIS — M25462 Effusion, left knee: Secondary | ICD-10-CM | POA: Diagnosis not present

## 2018-08-26 DIAGNOSIS — D649 Anemia, unspecified: Secondary | ICD-10-CM | POA: Diagnosis not present

## 2018-08-26 DIAGNOSIS — M1711 Unilateral primary osteoarthritis, right knee: Secondary | ICD-10-CM | POA: Diagnosis not present

## 2018-08-26 DIAGNOSIS — Z79899 Other long term (current) drug therapy: Secondary | ICD-10-CM | POA: Diagnosis not present

## 2018-08-26 DIAGNOSIS — M459 Ankylosing spondylitis of unspecified sites in spine: Secondary | ICD-10-CM | POA: Diagnosis not present

## 2018-08-26 DIAGNOSIS — H209 Unspecified iridocyclitis: Secondary | ICD-10-CM | POA: Diagnosis not present

## 2018-09-06 DIAGNOSIS — E1122 Type 2 diabetes mellitus with diabetic chronic kidney disease: Secondary | ICD-10-CM | POA: Diagnosis not present

## 2018-09-20 DIAGNOSIS — H44111 Panuveitis, right eye: Secondary | ICD-10-CM | POA: Diagnosis not present

## 2018-09-20 DIAGNOSIS — Z961 Presence of intraocular lens: Secondary | ICD-10-CM | POA: Diagnosis not present

## 2018-09-20 DIAGNOSIS — Z79899 Other long term (current) drug therapy: Secondary | ICD-10-CM | POA: Diagnosis not present

## 2018-09-20 DIAGNOSIS — M452 Ankylosing spondylitis of cervical region: Secondary | ICD-10-CM | POA: Diagnosis not present

## 2018-09-20 DIAGNOSIS — H35711 Central serous chorioretinopathy, right eye: Secondary | ICD-10-CM | POA: Diagnosis not present

## 2018-09-23 DIAGNOSIS — H26491 Other secondary cataract, right eye: Secondary | ICD-10-CM | POA: Diagnosis not present

## 2018-09-26 DIAGNOSIS — R3915 Urgency of urination: Secondary | ICD-10-CM | POA: Diagnosis not present

## 2018-09-26 DIAGNOSIS — R350 Frequency of micturition: Secondary | ICD-10-CM | POA: Diagnosis not present

## 2018-09-27 DIAGNOSIS — E1122 Type 2 diabetes mellitus with diabetic chronic kidney disease: Secondary | ICD-10-CM | POA: Diagnosis not present

## 2018-09-27 DIAGNOSIS — G8929 Other chronic pain: Secondary | ICD-10-CM | POA: Diagnosis not present

## 2018-09-27 DIAGNOSIS — R42 Dizziness and giddiness: Secondary | ICD-10-CM | POA: Diagnosis not present

## 2018-09-28 DIAGNOSIS — Z03818 Encounter for observation for suspected exposure to other biological agents ruled out: Secondary | ICD-10-CM | POA: Diagnosis not present

## 2018-10-04 DIAGNOSIS — G459 Transient cerebral ischemic attack, unspecified: Secondary | ICD-10-CM | POA: Diagnosis not present

## 2018-10-04 DIAGNOSIS — R42 Dizziness and giddiness: Secondary | ICD-10-CM | POA: Diagnosis not present

## 2018-10-05 ENCOUNTER — Emergency Department (HOSPITAL_COMMUNITY)
Admission: EM | Admit: 2018-10-05 | Discharge: 2018-10-05 | Disposition: A | Discharge status: Home / Self Care | Payer: Medicare Other | Attending: Emergency Medicine | Admitting: Emergency Medicine

## 2018-10-05 ENCOUNTER — Other Ambulatory Visit: Payer: Self-pay

## 2018-10-05 ENCOUNTER — Encounter (HOSPITAL_COMMUNITY): Payer: Self-pay

## 2018-10-05 ENCOUNTER — Emergency Department (HOSPITAL_COMMUNITY): Payer: Medicare Other

## 2018-10-05 DIAGNOSIS — R509 Fever, unspecified: Secondary | ICD-10-CM | POA: Diagnosis not present

## 2018-10-05 DIAGNOSIS — B349 Viral infection, unspecified: Secondary | ICD-10-CM | POA: Insufficient documentation

## 2018-10-05 NOTE — ED Provider Notes (Signed)
[facility] DEPT Provider Note   CSN: [id number] Arrival date & time: 10/05/18  1721     History   Chief Complaint Chief Complaint  Patient presents with  . Fatigue  . Fever  . Cough    HPI Steven [name] is a 84 y.o. male.  He is presenting here today with feeling in his words crummy today.  He said he had a low-grade temp of 100.2 along with a little bit of a dry cough and some achiness in his neck and joints.  The patient said he had gotten Covid testing 3 weeks ago not because he was sick because he just wanted to know.  He said he both got the nasal swab and the antibody test.  He was negative at that time.  He now remembers that he got his shingles shot yesterday and he is wondering if that is why he does not feel so good today.     The history is provided by the patient.  Fever Max temp prior to arrival:  100.2 Temp source:  Oral Severity:  Moderate Onset quality:  Gradual Timing:  Intermittent Progression:  Unchanged Chronicity:  New Relieved by:  None tried Worsened by:  Nothing Ineffective treatments:  None tried Associated symptoms: cough, myalgias, rhinorrhea and sore throat   Associated symptoms: no chest pain, no confusion, no congestion, no diarrhea, no dysuria, no headaches, no nausea, no rash, no somnolence and no vomiting   Cough Associated symptoms: fever, myalgias, rhinorrhea and sore throat   Associated symptoms: no chest pain, no headaches and no rash     Past Medical History:  Diagnosis Date  . Allergy    enviromental  . Anemia 2017  . Anxiety   . Arthritis    ankylosing spodilitis  . Blood transfusion without reported diagnosis    during surgery  . Cataract   . Depression   . Dyspnea    with exertion - had Echo done 09/29/16  . History of kidney stones   . Hyperlipidemia   . Intestinal obstruction ([place])   . Pneumonia    as a child    Patient Active Problem List   Diagnosis Date Noted  . Anemia  12/31/2017  . Failed arthroplasty ([place]) 11/06/2016  . 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 Dyspnea on exertion 09/11/2016  . SBO (small bowel obstruction) ([place]) 03/21/2015  . Abdominal pain 04/10/2014  . Bowel obstruction ([place]) 04/10/2014  . Hyperlipidemia 04/10/2014  . History of ankylosing spondylitis 04/10/2014    Past Surgical History:  Procedure Laterality Date  . ABDOMINAL SURGERY     had abcess  . APPENDECTOMY    . CHOLECYSTECTOMY     with lysis of adhesions  . COLONOSCOPY    . COLOSTOMY    . COLOSTOMY REVERSAL    . EYE SURGERY Left    scar tissue removed from cornea  . EYE SURGERY Right    cataract surgery with lens implant  . JOINT REPLACEMENT Right    hip  x 2 1999 and 2007  . SHOULDER ARTHROSCOPY WITH ROTATOR CUFF REPAIR AND SUBACROMIAL DECOMPRESSION Left 03/02/2013   Procedure: LEFT SHOULDER ARTHROSCOPY WITH SUBACROMIAL DECOMPRESSION DISTAL CALVICLE RESECTION AND ROTATOR CUFF REPAIR ;  Surgeon: [name], MD;  Location: [facility];  Service: Orthopedics;  Laterality: Left;  . TOTAL HIP REVISION Right 11/06/2016   Procedure: TOTAL HIP REVISION OF THE ACETABULAR COMPONENT;  Surgeon: [name], MD;  Location: [facility];  Service: Orthopedics;  Laterality: Right;        Home Medications    Prior to Admission medications   Medication Sig Start Date End Date Taking? Authorizing Provider  Artificial Tear Solution (SOOTHE XP) SOLN Place 1 drop into both eyes 2 (two) times daily.    [provider]  azithromycin (ZITHROMAX) 250 MG tablet Take 2 tabs today, then 1 tab daily x 4 days 04/17/18   Jearld Fenton, NP  Biotin 5 MG CAPS Take 1 capsule by mouth daily.    [provider]  Calcium Carbonate-Vitamin D 600-400 MG-UNIT tablet Take 1 tablet by mouth 2 (two) times daily.    [provider]  cholecalciferol (VITAMIN D) 1000 units tablet Take 1,000 Units by mouth daily.    [provider]  citalopram (CELEXA) 40 MG  tablet Take 40 mg by mouth daily.    [provider]  folic acid (FOLVITE) 1 MG tablet Take 1 mg by mouth daily.     [provider]  Iron-FA-B Cmp-C-Biot-Probiotic (FUSION PLUS) CAPS Take 1 tablet by mouth daily.  09/14/16   [provider]  methotrexate (RHEUMATREX) 2.5 MG tablet Take 25 mg by mouth once a week. Sunday 09/15/16   [provider]  Multiple Vitamins-Minerals (PRESERVISION AREDS 2 PO) Take 1 tablet by mouth 2 (two) times daily.    [provider]  prednisoLONE acetate (PRED FORTE) 1 % ophthalmic suspension Place 1 drop into the right eye 4 (four) times daily. 12/25/16   [provider]  Probiotic Product (PROBIOTIC PO) Take 1 capsule by mouth daily.    [provider]  simvastatin (ZOCOR) 5 MG tablet Take 10 mg by mouth at bedtime.    [provider]    Family History Family History  Problem Relation Age of Onset  . Heart attack Mother   . Diabetes Mother   . Breast cancer Mother   . Heart attack Maternal Grandfather   . Pancreatic cancer Brother   . Colon cancer Neg Hx   . Esophageal cancer Neg Hx   . Stomach cancer Neg Hx     Social History Social History   Tobacco Use  . Smoking status: Former Research scientist (life sciences)  . Smokeless tobacco: Never Used  Substance Use Topics  . Alcohol use: Yes    Comment: 3 beers or glasses of wine a day--reports stopped ETOH 11/01/16   . Drug use: No     Allergies   Indomethacin and Lactose intolerance (gi)   Review of Systems Review of Systems  Constitutional: Positive for fever.  HENT: Positive for rhinorrhea and sore throat. Negative for congestion.   Eyes: Negative for visual disturbance.  Respiratory: Positive for cough.   Cardiovascular: Negative for chest pain.  Gastrointestinal: Negative for diarrhea, nausea and vomiting.  Genitourinary: Negative for dysuria.  Musculoskeletal: Positive for myalgias.  Skin: Negative for rash.  Neurological: Negative for  headaches.  Psychiatric/Behavioral: Negative for confusion.     Physical Exam Updated Vital Signs BP 129/79 (BP Location: Right Arm)   Pulse 79   Temp 100.2 F (37.9 C) (Oral)   Resp 14   Ht 5\' 6"  (1.676 m)   Wt 63 kg   SpO2 97%   BMI 22.44 kg/m   Physical Exam Vitals signs and nursing note reviewed.  Constitutional:      Appearance: He is well-developed.  HENT:     Head: Normocephalic and atraumatic.  Eyes:     Conjunctiva/sclera: Conjunctivae normal.  Neck:  Musculoskeletal: Neck supple.  Cardiovascular:     Rate and Rhythm: Normal rate and regular rhythm.     Heart sounds: No murmur.  Pulmonary:     Effort: Pulmonary effort is normal. No respiratory distress.     Breath sounds: Normal breath sounds.  Abdominal:     Palpations: Abdomen is soft.     Tenderness: There is no abdominal tenderness.  Musculoskeletal: Normal range of motion.     Right lower leg: No edema.     Left lower leg: No edema.  Skin:    General: Skin is warm and dry.     Capillary Refill: Capillary refill takes less than 2 seconds.  Neurological:     General: No focal deficit present.     Mental Status: He is alert and oriented to person, place, and time.      ED Treatments / Results  Labs (all labs ordered are listed, but only abnormal results are displayed) Labs Reviewed - No data to display  EKG    Radiology No results found.  Procedures Procedures (including critical care time)  Medications Ordered in ED Medications - No data to display   Initial Impression / Assessment and Plan / ED Course  I have reviewed the triage vital signs and the nursing notes.  Pertinent labs & imaging results that were available during my care of the patient were reviewed by me and considered in my medical decision making (see chart for details).    Differential diagnosis includes drug reaction, viral illness, Covid, pneumonia.  I offer the patient that we can do some blood work chest x-ray  and repeat Covid testing.  He thought it might be reasonable just to go home and see if his symptoms are improving by tomorrow.  I do feel like if this is related to a shingles vaccine he should show quick improvement and if he does not feel well he can come back and we would be happy to continue to work him up.     Final Clinical Impressions(s) / ED Diagnoses   Final diagnoses:  Fever, unspecified fever cause  Viral syndrome    ED Discharge Orders    None       Hayden Rasmussen, MD 10/06/18 1309

## 2018-10-05 NOTE — ED Triage Notes (Signed)
Pt states fever, dry cough, "cruminess", "neck ache", fatigue x 2-3 days. Pt states highest fever of 100.2

## 2018-10-05 NOTE — Discharge Instructions (Addendum)
You were seen in the emergency department for low-grade fever and some generalized malaise and pains in your joints.  This possibly is related to your shingles vaccine that you receive yesterday but this also may be some other infectious illness.  You were offered chest x-ray and blood work but you felt you should go home and see how your symptoms progress.  Please return to the emergency department if you have any concerns or any worsening symptoms.

## 2018-10-06 DIAGNOSIS — G459 Transient cerebral ischemic attack, unspecified: Secondary | ICD-10-CM | POA: Diagnosis not present

## 2018-11-24 DIAGNOSIS — R42 Dizziness and giddiness: Secondary | ICD-10-CM | POA: Diagnosis not present

## 2018-11-28 DIAGNOSIS — Z9841 Cataract extraction status, right eye: Secondary | ICD-10-CM | POA: Diagnosis not present

## 2018-11-28 DIAGNOSIS — H524 Presbyopia: Secondary | ICD-10-CM | POA: Diagnosis not present

## 2018-12-22 DIAGNOSIS — L578 Other skin changes due to chronic exposure to nonionizing radiation: Secondary | ICD-10-CM | POA: Diagnosis not present

## 2018-12-22 DIAGNOSIS — D044 Carcinoma in situ of skin of scalp and neck: Secondary | ICD-10-CM | POA: Diagnosis not present

## 2018-12-22 DIAGNOSIS — D485 Neoplasm of uncertain behavior of skin: Secondary | ICD-10-CM | POA: Diagnosis not present

## 2018-12-22 DIAGNOSIS — L570 Actinic keratosis: Secondary | ICD-10-CM | POA: Diagnosis not present

## 2019-01-03 DIAGNOSIS — H44111 Panuveitis, right eye: Secondary | ICD-10-CM | POA: Diagnosis not present

## 2019-01-03 DIAGNOSIS — Z961 Presence of intraocular lens: Secondary | ICD-10-CM | POA: Diagnosis not present

## 2019-01-03 DIAGNOSIS — Z79899 Other long term (current) drug therapy: Secondary | ICD-10-CM | POA: Diagnosis not present

## 2019-01-03 DIAGNOSIS — H35711 Central serous chorioretinopathy, right eye: Secondary | ICD-10-CM | POA: Diagnosis not present

## 2019-01-03 DIAGNOSIS — M452 Ankylosing spondylitis of cervical region: Secondary | ICD-10-CM | POA: Diagnosis not present

## 2019-01-17 DIAGNOSIS — J3081 Allergic rhinitis due to animal (cat) (dog) hair and dander: Secondary | ICD-10-CM | POA: Diagnosis not present

## 2019-01-17 DIAGNOSIS — J301 Allergic rhinitis due to pollen: Secondary | ICD-10-CM | POA: Diagnosis not present

## 2019-01-17 DIAGNOSIS — J3089 Other allergic rhinitis: Secondary | ICD-10-CM | POA: Diagnosis not present

## 2019-01-17 DIAGNOSIS — T63441D Toxic effect of venom of bees, accidental (unintentional), subsequent encounter: Secondary | ICD-10-CM | POA: Diagnosis not present

## 2019-02-08 DIAGNOSIS — D044 Carcinoma in situ of skin of scalp and neck: Secondary | ICD-10-CM | POA: Diagnosis not present

## 2019-02-08 DIAGNOSIS — Z85828 Personal history of other malignant neoplasm of skin: Secondary | ICD-10-CM | POA: Diagnosis not present

## 2019-02-08 DIAGNOSIS — L578 Other skin changes due to chronic exposure to nonionizing radiation: Secondary | ICD-10-CM | POA: Diagnosis not present

## 2019-02-08 DIAGNOSIS — L988 Other specified disorders of the skin and subcutaneous tissue: Secondary | ICD-10-CM | POA: Diagnosis not present

## 2019-02-21 ENCOUNTER — Emergency Department: Payer: Medicare Other

## 2019-02-21 ENCOUNTER — Other Ambulatory Visit: Payer: Self-pay

## 2019-02-21 ENCOUNTER — Encounter: Payer: Self-pay | Admitting: Emergency Medicine

## 2019-02-21 ENCOUNTER — Emergency Department
Admission: EM | Admit: 2019-02-21 | Discharge: 2019-02-21 | Disposition: A | Discharge status: Home / Self Care | Payer: Medicare Other | Attending: Student in an Organized Health Care Education/Training Program | Admitting: Student in an Organized Health Care Education/Training Program

## 2019-02-21 DIAGNOSIS — Z87891 Personal history of nicotine dependence: Secondary | ICD-10-CM | POA: Diagnosis not present

## 2019-02-21 DIAGNOSIS — Z96641 Presence of right artificial hip joint: Secondary | ICD-10-CM | POA: Insufficient documentation

## 2019-02-21 DIAGNOSIS — Z23 Encounter for immunization: Secondary | ICD-10-CM | POA: Diagnosis not present

## 2019-02-21 DIAGNOSIS — S61216A Laceration without foreign body of right little finger without damage to nail, initial encounter: Secondary | ICD-10-CM | POA: Diagnosis not present

## 2019-02-21 DIAGNOSIS — Y939 Activity, unspecified: Secondary | ICD-10-CM | POA: Diagnosis not present

## 2019-02-21 DIAGNOSIS — W312XXA Contact with powered woodworking and forming machines, initial encounter: Secondary | ICD-10-CM | POA: Insufficient documentation

## 2019-02-21 DIAGNOSIS — Y929 Unspecified place or not applicable: Secondary | ICD-10-CM | POA: Diagnosis not present

## 2019-02-21 DIAGNOSIS — Z79899 Other long term (current) drug therapy: Secondary | ICD-10-CM | POA: Diagnosis not present

## 2019-02-21 DIAGNOSIS — Y999 Unspecified external cause status: Secondary | ICD-10-CM | POA: Diagnosis not present

## 2019-02-21 MED ORDER — CEPHALEXIN 500 MG PO CAPS
500.0000 mg | ORAL_CAPSULE | Freq: Once | ORAL | Status: AC
  Administered 2019-02-21: 500 mg via ORAL
  Filled 2019-02-21: qty 1

## 2019-02-21 MED ORDER — CEPHALEXIN 500 MG PO CAPS: 500.0000 mg | ORAL_CAPSULE | Freq: Three times a day (TID) | ORAL | 0 refills | Status: AC

## 2019-02-21 MED ORDER — ONDANSETRON 4 MG PO TBDP
4.0000 mg | ORAL_TABLET | Freq: Once | ORAL | Status: AC
  Administered 2019-02-21: 4 mg via ORAL
  Filled 2019-02-21: qty 1

## 2019-02-21 MED ORDER — TETANUS-DIPHTH-ACELL PERTUSSIS 5-2.5-18.5 LF-MCG/0.5 IM SUSP
0.5000 mL | Freq: Once | INTRAMUSCULAR | Status: AC
  Administered 2019-02-21: 0.5 mL via INTRAMUSCULAR
  Filled 2019-02-21: qty 0.5

## 2019-02-21 MED ORDER — HYDROCODONE-ACETAMINOPHEN 5-325 MG PO TABS
1.0000 | ORAL_TABLET | Freq: Once | ORAL | Status: AC
  Administered 2019-02-21: 19:00:00 1 via ORAL
  Filled 2019-02-21: qty 1

## 2019-02-21 MED ORDER — BUPIVACAINE HCL (PF) 0.5 % IJ SOLN
30.0000 mL | Freq: Once | INTRAMUSCULAR | Status: AC
  Administered 2019-02-21: 30 mL
  Filled 2019-02-21: qty 30

## 2019-02-21 NOTE — ED Provider Notes (Signed)
[facility] Emergency Department Provider Note    First MD Initiated Contact with Patient 02/21/19 1830     (approximate)  I have reviewed the triage vital signs and the nursing notes.   HISTORY  Chief Complaint Laceration    HPI Steven [name] is a 84 y.o. male bolus past medical history presents for evaluation of injury to his right pinky finger that occurred while he was using a table saw today.  Does wear recall exactly how it happened but cut the volar aspect of his pinky finger.  Not currently on any blood thinners he states.  No other injury.  States the pain is mild to moderate.  Went to urgent care but given the extent of the injury was directed to the ER for further evaluation.    Past Medical History:  Diagnosis Date  . Allergy    enviromental  . Anemia 2017  . Anxiety   . Arthritis    ankylosing spodilitis  . Blood transfusion without reported diagnosis    during surgery  . Cataract   . Depression   . Dyspnea    with exertion - had Echo done 09/29/16  . History of kidney stones   . Hyperlipidemia   . Intestinal obstruction ([place])   . Pneumonia    as a child   Family History  Problem Relation Age of Onset  . Heart attack Mother   . Diabetes Mother   . Breast cancer Mother   . Heart attack Maternal Grandfather   . Pancreatic cancer Brother   . Colon cancer Neg Hx   . Esophageal cancer Neg Hx   . Stomach cancer Neg Hx    Past Surgical History:  Procedure Laterality Date  . ABDOMINAL SURGERY     had abcess  . APPENDECTOMY    . CHOLECYSTECTOMY     with lysis of adhesions  . COLONOSCOPY    . COLOSTOMY    . COLOSTOMY REVERSAL    . EYE SURGERY Left    scar tissue removed from cornea  . EYE SURGERY Right    cataract surgery with lens implant  . JOINT REPLACEMENT Right    hip  x 2 1999 and 2007  . SHOULDER ARTHROSCOPY WITH ROTATOR CUFF REPAIR AND SUBACROMIAL DECOMPRESSION Left 03/02/2013   Procedure: LEFT SHOULDER  ARTHROSCOPY WITH SUBACROMIAL DECOMPRESSION DISTAL CALVICLE RESECTION AND ROTATOR CUFF REPAIR ;  Surgeon: [name], MD;  Location: [facility];  Service: Orthopedics;  Laterality: Left;  . TOTAL HIP REVISION Right 11/06/2016   Procedure: TOTAL HIP REVISION OF THE ACETABULAR COMPONENT;  Surgeon: [name], MD;  Location: [facility];  Service: Orthopedics;  Laterality: Right;   Patient Active Problem List   Diagnosis Date Noted  . Anemia 12/31/2017  . Failed arthroplasty ([place]) 11/06/2016  . 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 Dyspnea on exertion 09/11/2016  . SBO (small bowel obstruction) ([place]) 03/21/2015  . Abdominal pain 04/10/2014  . Bowel obstruction ([place]) 04/10/2014  . Hyperlipidemia 04/10/2014  . History of ankylosing spondylitis 04/10/2014      Prior to Admission medications   Medication Sig Start Date End Date Taking? Authorizing Provider  Artificial Tear Solution (SOOTHE XP) SOLN Place 1 drop into both eyes 2 (two) times daily.    [provider]  azithromycin (ZITHROMAX) 250 MG tablet Take 2 tabs today, then 1 tab daily x 4 days 04/17/18   Jearld Fenton, NP  Biotin 5 MG CAPS Take  1 capsule by mouth daily.    [provider]  Calcium Carbonate-Vitamin D 600-400 MG-UNIT tablet Take 1 tablet by mouth 2 (two) times daily.    [provider]  cephALEXin (KEFLEX) 500 MG capsule Take 1 capsule (500 mg total) by mouth 3 (three) times daily for 7 days. 02/21/19 02/28/19  Merlyn Lot, MD  cholecalciferol (VITAMIN D) 1000 units tablet Take 1,000 Units by mouth daily.    [provider]  citalopram (CELEXA) 40 MG tablet Take 40 mg by mouth daily.    [provider]  folic acid (FOLVITE) 1 MG tablet Take 1 mg by mouth daily.     [provider]  Iron-FA-B Cmp-C-Biot-Probiotic (FUSION PLUS) CAPS Take 1 tablet by mouth daily.  09/14/16   [provider]  methotrexate (RHEUMATREX) 2.5 MG tablet Take 25 mg by mouth  once a week. Sunday 09/15/16   [provider]  Multiple Vitamins-Minerals (PRESERVISION AREDS 2 PO) Take 1 tablet by mouth 2 (two) times daily.    [provider]  prednisoLONE acetate (PRED FORTE) 1 % ophthalmic suspension Place 1 drop into the right eye 4 (four) times daily. 12/25/16   [provider]  Probiotic Product (PROBIOTIC PO) Take 1 capsule by mouth daily.    [provider]  simvastatin (ZOCOR) 5 MG tablet Take 10 mg by mouth at bedtime.    [provider]    Allergies Indomethacin and Lactose intolerance (gi)    Social History Social History   Tobacco Use  . Smoking status: Former Research scientist (life sciences)  . Smokeless tobacco: Never Used  Substance Use Topics  . Alcohol use: Yes    Comment: 3 beers or glasses of wine a day--reports stopped ETOH 11/01/16   . Drug use: No    Review of Systems Patient denies headaches, rhinorrhea, blurry vision, numbness, shortness of breath, chest pain, edema, cough, abdominal pain, nausea, vomiting, diarrhea, dysuria, fevers, rashes or hallucinations unless otherwise stated above in HPI. ____________________________________________   PHYSICAL EXAM:  VITAL SIGNS: Vitals:   02/21/19 1823 02/21/19 1824  BP:  (!) 162/75  Pulse: 77   Resp: 16   Temp: 98 F (36.7 C)   SpO2: 100%     Constitutional: Alert and oriented.  Eyes: Conjunctivae are normal.  Head: Atraumatic. Nose: No congestion/rhinnorhea. Mouth/Throat: Mucous membranes are moist.   Neck: No stridor. Painless ROM.  Cardiovascular: Normal rate, regular rhythm. Grossly normal heart sounds.  Good peripheral circulation. Respiratory: Normal respiratory effort.  No retractions. Lungs CTAB. Gastrointestinal: Soft and nontender. No distention.  Genitourinary:  Musculoskeletal: There is a 1 cm by three-quarter centimeter avulsion injury to the volar aspect of the right fifth digit just proximal to DIP joint.  Flexion and extension mechanism is  intact.  No obvious injury to the joint capsule through visualized defect.  Does have some slight decreased sensation to the lateral distal digit.  Brisk cap refill No lower extremity tenderness nor edema.  No joint effusions. Neurologic:  Normal speech and language. No gross focal neurologic deficits are appreciated. No facial droop Skin:  Skin is warm, dry and intact. No rash noted. Psychiatric: Mood and affect are normal. Speech and behavior are normal.  ____________________________________________   LABS (all labs ordered are listed, but only abnormal results are displayed)  No results found for this or any previous visit (from the past 24 hour(s)). ____________________________________________ ____________________________________________  [id number]  I personally reviewed all radiographic images ordered to evaluate for the above acute complaints and  reviewed radiology reports and findings.  These findings were personally discussed with the patient.  Please see medical record for radiology report.  ____________________________________________   PROCEDURES  Procedure(s) performed:  Marland KitchenMarland KitchenLaceration Repair  Date/Time: 02/21/2019 7:47 PM Performed by: Merlyn Lot, MD Authorized by: Merlyn Lot, MD   Consent:    Consent obtained:  Verbal   Consent given by:  Patient   Risks discussed:  Infection, pain, retained foreign body, poor cosmetic result and poor wound healing Anesthesia (see MAR for exact dosages):    Anesthesia method:  Nerve block   Block anesthetic:  Bupivacaine 0.25% w/o epi   Block technique:  Ring block Laceration details:    Location:  Finger   Finger location:  R ring finger   Length (cm):  2   Depth (mm):  5 Repair type:    Repair type:  Complex Exploration:    Hemostasis achieved with:  Direct pressure   Wound exploration: wound explored through full range of motion and entire depth of wound probed and visualized     Contaminated: no    Treatment:    Area cleansed with:  Saline and Betadine   Amount of cleaning:  Extensive   Irrigation solution:  Sterile saline   Irrigation volume:  >500   Irrigation method:  Syringe   Visualized foreign bodies/material removed: no   Skin repair:    Repair method:  Sutures   Suture size:  5-0   Number of sutures:  5 Approximation:    Approximation:  Close Post-procedure details:    Dressing:  Sterile dressing, antibiotic ointment, non-adherent dressing and splint for protection   Patient tolerance of procedure:  Tolerated well, no immediate complications      Critical Care performed: no ____________________________________________   INITIAL IMPRESSION / ASSESSMENT AND PLAN / ED COURSE  Pertinent labs & imaging results that were available during my care of the patient were reviewed by me and considered in my medical decision making (see chart for details).   DDX: laceration, fracture, avulsion, tendon injury, joint capsule injury  MIKLOS STEELY is a 84 y.o. who presents to the ED  with 2 cm laceration on left 5th digit as descrbied above. No  motor deficit. Denies any other injuries. Td updated. VSS. Exam with good distal cap refill. No functional tendon deficit, no sensory deficit. No signs of erythema or surrounding infection. Plan lac repair.   Laceration was explored, irrigated, and repaired without complications. No FB in a bloodless field.    X-ray is also w/o FOB or fracture. Discussed case with Dr. Roland Rack of orthopedics in consultation.  Agrees with plan for extensive cleaning, abx, repair and close outpatient follow up.  Discussed at length wound care and infection precautions with patient.      The patient was evaluated in Emergency Department today for the symptoms described in the history of present illness. He/she was evaluated in the context of the global COVID-19 pandemic, which necessitated consideration that the patient might be at risk for infection with  the SARS-CoV-2 virus that causes COVID-19. Institutional protocols and algorithms that pertain to the evaluation of patients at risk for COVID-19 are in a state of rapid change based on information released by regulatory bodies including the CDC and federal and state organizations. These policies and algorithms were followed during the patient's care in the ED.  As part of my medical decision making, I reviewed the following data within the [facility] notes reviewed and incorporated,  Labs reviewed, notes from prior ED visits and  Controlled Substance Database   ____________________________________________   FINAL CLINICAL IMPRESSION(S) / ED DIAGNOSES  Final diagnoses:  Laceration of right little finger without foreign body without damage to nail, initial encounter      NEW MEDICATIONS STARTED DURING THIS VISIT:  New Prescriptions   CEPHALEXIN (KEFLEX) 500 MG CAPSULE    Take 1 capsule (500 mg total) by mouth 3 (three) times daily for 7 days.     Note:  This document was prepared using Dragon voice recognition software and may include unintentional dictation errors.    Merlyn Lot, MD 02/21/19 [phone]

## 2019-02-21 NOTE — ED Triage Notes (Signed)
PT from [facility] for lac to RT 5th digit. Pt states he got stuck in table saw. Pt has noted deep lac. Bleeding controlled. NAD

## 2019-02-21 NOTE — ED Triage Notes (Signed)
First Nurse Note:  Arrives from [facility] for ED evaluation of right fifth finger laceration.  Per report from [facility], patient cut finger with table saw and wound is down to bone with tendon involvement.  Dressing intact upon arrival.  No evidence of bleeding at this time.

## 2019-02-21 NOTE — ED Notes (Signed)
Pt has a laceration to right fifth finger.  Pt cut finger on a table saw today.  Bleeding controlled.  Good distal pulses.  Pressure bandage in place.  Pt alert.

## 2019-02-21 NOTE — Discharge Instructions (Addendum)
Please follow up with Dr. [facility] for wound check.  Apply neosporin or triple antibiotic ointment twice daily.  Keep in splint to protect wound.  Return for fevers or purulent drainage.

## 2019-02-28 DIAGNOSIS — S61218D Laceration without foreign body of other finger without damage to nail, subsequent encounter: Secondary | ICD-10-CM | POA: Diagnosis not present

## 2019-03-07 DIAGNOSIS — S61218D Laceration without foreign body of other finger without damage to nail, subsequent encounter: Secondary | ICD-10-CM | POA: Diagnosis not present

## 2019-03-07 DIAGNOSIS — Z4802 Encounter for removal of sutures: Secondary | ICD-10-CM | POA: Diagnosis not present

## 2019-03-09 ENCOUNTER — Emergency Department: Payer: Medicare Other

## 2019-03-09 ENCOUNTER — Inpatient Hospital Stay
Admission: EM | Admit: 2019-03-09 | Discharge: 2019-03-12 | DRG: 390 | Disposition: A | Discharge status: Home / Self Care | Payer: Medicare Other | Attending: Internal Medicine | Admitting: Internal Medicine

## 2019-03-09 ENCOUNTER — Other Ambulatory Visit: Payer: Self-pay

## 2019-03-09 ENCOUNTER — Encounter: Payer: Self-pay | Admitting: Emergency Medicine

## 2019-03-09 DIAGNOSIS — R1110 Vomiting, unspecified: Secondary | ICD-10-CM | POA: Diagnosis not present

## 2019-03-09 DIAGNOSIS — Z803 Family history of malignant neoplasm of breast: Secondary | ICD-10-CM

## 2019-03-09 DIAGNOSIS — Z9889 Other specified postprocedural states: Secondary | ICD-10-CM | POA: Diagnosis not present

## 2019-03-09 DIAGNOSIS — K56609 Unspecified intestinal obstruction, unspecified as to partial versus complete obstruction: Secondary | ICD-10-CM | POA: Diagnosis not present

## 2019-03-09 DIAGNOSIS — K5651 Intestinal adhesions [bands], with partial obstruction: Secondary | ICD-10-CM

## 2019-03-09 DIAGNOSIS — Z4682 Encounter for fitting and adjustment of non-vascular catheter: Secondary | ICD-10-CM | POA: Diagnosis not present

## 2019-03-09 DIAGNOSIS — E785 Hyperlipidemia, unspecified: Secondary | ICD-10-CM | POA: Diagnosis not present

## 2019-03-09 DIAGNOSIS — E782 Mixed hyperlipidemia: Secondary | ICD-10-CM | POA: Diagnosis present

## 2019-03-09 DIAGNOSIS — Z8739 Personal history of other diseases of the musculoskeletal system and connective tissue: Secondary | ICD-10-CM

## 2019-03-09 DIAGNOSIS — Z9049 Acquired absence of other specified parts of digestive tract: Secondary | ICD-10-CM

## 2019-03-09 DIAGNOSIS — R112 Nausea with vomiting, unspecified: Secondary | ICD-10-CM

## 2019-03-09 DIAGNOSIS — M459 Ankylosing spondylitis of unspecified sites in spine: Secondary | ICD-10-CM | POA: Diagnosis present

## 2019-03-09 DIAGNOSIS — D649 Anemia, unspecified: Secondary | ICD-10-CM | POA: Diagnosis present

## 2019-03-09 DIAGNOSIS — Z20828 Contact with and (suspected) exposure to other viral communicable diseases: Secondary | ICD-10-CM | POA: Diagnosis present

## 2019-03-09 DIAGNOSIS — R1084 Generalized abdominal pain: Secondary | ICD-10-CM

## 2019-03-09 DIAGNOSIS — Z8249 Family history of ischemic heart disease and other diseases of the circulatory system: Secondary | ICD-10-CM | POA: Diagnosis not present

## 2019-03-09 DIAGNOSIS — R9431 Abnormal electrocardiogram [ECG] [EKG]: Secondary | ICD-10-CM | POA: Diagnosis not present

## 2019-03-09 DIAGNOSIS — R109 Unspecified abdominal pain: Secondary | ICD-10-CM | POA: Diagnosis present

## 2019-03-09 DIAGNOSIS — F329 Major depressive disorder, single episode, unspecified: Secondary | ICD-10-CM | POA: Diagnosis present

## 2019-03-09 DIAGNOSIS — Z800 Family history of malignant neoplasm of digestive organs: Secondary | ICD-10-CM | POA: Diagnosis not present

## 2019-03-09 DIAGNOSIS — Z833 Family history of diabetes mellitus: Secondary | ICD-10-CM

## 2019-03-09 DIAGNOSIS — Z87891 Personal history of nicotine dependence: Secondary | ICD-10-CM | POA: Diagnosis not present

## 2019-03-09 DIAGNOSIS — Z03818 Encounter for observation for suspected exposure to other biological agents ruled out: Secondary | ICD-10-CM | POA: Diagnosis not present

## 2019-03-09 LAB — COMPREHENSIVE METABOLIC PANEL
ALT: 19 U/L (ref 0–44)
AST: 23 U/L (ref 15–41)
Albumin: 4.6 g/dL (ref 3.5–5.0)
Alkaline Phosphatase: 44 U/L (ref 38–126)
Anion gap: 14 (ref 5–15)
BUN: 21 mg/dL (ref 8–23)
CO2: 26 mmol/L (ref 22–32)
Calcium: 9.6 mg/dL (ref 8.9–10.3)
Chloride: 100 mmol/L (ref 98–111)
Creatinine, Ser: 0.87 mg/dL (ref 0.61–1.24)
GFR calc Af Amer: >60 mL/min (ref >60)
GFR calc non Af Amer: >60 mL/min (ref >60)
Glucose, Bld: 197 mg/dL — ABNORMAL HIGH (ref 70–99)
Potassium: 3.6 mmol/L (ref 3.5–5.1)
Sodium: 140 mmol/L (ref 135–145)
Total Bilirubin: 0.9 mg/dL (ref 0.3–1.2)
Total Protein: 8 g/dL (ref 6.5–8.1)

## 2019-03-09 LAB — URINALYSIS, COMPLETE (UACMP) WITH MICROSCOPIC
Bacteria, UA: NONE SEEN
Bilirubin Urine: NEGATIVE
Glucose, UA: NEGATIVE mg/dL
Hgb urine dipstick: NEGATIVE
Ketones, ur: NEGATIVE mg/dL
Leukocytes,Ua: NEGATIVE
Nitrite: NEGATIVE
Protein, ur: NEGATIVE mg/dL
Specific Gravity, Urine: 1.018 (ref 1.005–1.030)
pH: 6 (ref 5.0–8.0)

## 2019-03-09 LAB — CBC WITH DIFFERENTIAL/PLATELET
Abs Immature Granulocytes: 0.04 10*3/uL (ref 0.00–0.07)
Basophils Absolute: 0 10*3/uL (ref 0.0–0.1)
Basophils Relative: 0 %
Eosinophils Absolute: 0.1 10*3/uL (ref 0.0–0.5)
Eosinophils Relative: 1 %
HCT: 35.8 % — ABNORMAL LOW (ref 39.0–52.0)
Hemoglobin: 11.1 g/dL — ABNORMAL LOW (ref 13.0–17.0)
Immature Granulocytes: 0 %
Lymphocytes Relative: 9 %
Lymphs Abs: 1.3 10*3/uL (ref 0.7–4.0)
MCH: 27.5 pg (ref 26.0–34.0)
MCHC: 31 g/dL (ref 30.0–36.0)
MCV: 88.8 fL (ref 80.0–100.0)
Monocytes Absolute: 0.4 10*3/uL (ref 0.1–1.0)
Monocytes Relative: 3 %
Neutro Abs: 12.4 10*3/uL — ABNORMAL HIGH (ref 1.7–7.7)
Neutrophils Relative %: 87 %
Platelets: 374 10*3/uL (ref 150–400)
RBC: 4.03 MIL/uL — ABNORMAL LOW (ref 4.22–5.81)
RDW: 16 % — ABNORMAL HIGH (ref 11.5–15.5)
WBC: 14.3 10*3/uL — ABNORMAL HIGH (ref 4.0–10.5)
nRBC: 0 % (ref 0.0–0.2)

## 2019-03-09 LAB — GLUCOSE, CAPILLARY
Glucose-Capillary: 119 mg/dL — ABNORMAL HIGH (ref 70–99)
Glucose-Capillary: 107 mg/dL — ABNORMAL HIGH (ref 70–99)

## 2019-03-09 LAB — HEMOGLOBIN A1C
Hgb A1c MFr Bld: 6.1 % — ABNORMAL HIGH (ref 4.8–5.6)
Mean Plasma Glucose: 128.37 mg/dL

## 2019-03-09 LAB — SARS CORONAVIRUS 2 (TAT 6-24 HRS): SARS Coronavirus 2: NEGATIVE

## 2019-03-09 LAB — LIPASE, BLOOD: Lipase: 50 U/L (ref 11–51)

## 2019-03-09 MED ORDER — SODIUM CHLORIDE 0.9 % IV BOLUS
1000.0000 mL | Freq: Once | INTRAVENOUS | Status: AC
  Administered 2019-03-09: 1000 mL via INTRAVENOUS

## 2019-03-09 MED ORDER — IOHEXOL 300 MG/ML  SOLN
100.0000 mL | Freq: Once | INTRAMUSCULAR | Status: AC | PRN
  Administered 2019-03-09: 100 mL via INTRAVENOUS

## 2019-03-09 MED ORDER — ACETAMINOPHEN 650 MG RE SUPP: 650.0000 mg | Freq: Four times a day (QID) | RECTAL | Status: DC | PRN

## 2019-03-09 MED ORDER — ONDANSETRON 4 MG PO TBDP
4.0000 mg | ORAL_TABLET | Freq: Once | ORAL | Status: AC
  Administered 2019-03-09: 4 mg via ORAL
  Filled 2019-03-09: qty 1

## 2019-03-09 MED ORDER — POTASSIUM CHLORIDE 10 MEQ/100ML IV SOLN
10.0000 meq | INTRAVENOUS | Status: AC
  Administered 2019-03-09 (×2): 10 meq via INTRAVENOUS
  Filled 2019-03-09 (×3): qty 100

## 2019-03-09 MED ORDER — FENTANYL CITRATE (PF) 100 MCG/2ML IJ SOLN
25.0000 ug | Freq: Once | INTRAMUSCULAR | Status: AC
  Administered 2019-03-09: 25 ug via INTRAVENOUS
  Filled 2019-03-09: qty 2

## 2019-03-09 MED ORDER — SENNOSIDES-DOCUSATE SODIUM 8.6-50 MG PO TABS: 1.0000 | ORAL_TABLET | Freq: Every evening | ORAL | Status: DC | PRN

## 2019-03-09 MED ORDER — HEPARIN SODIUM (PORCINE) 5000 UNIT/ML IJ SOLN
5000.0000 [IU] | Freq: Three times a day (TID) | INTRAMUSCULAR | Status: DC
  Administered 2019-03-09 – 2019-03-12 (×8): 5000 [IU] via SUBCUTANEOUS
  Filled 2019-03-09 (×8): qty 1

## 2019-03-09 MED ORDER — ONDANSETRON HCL 4 MG/2ML IJ SOLN
4.0000 mg | Freq: Once | INTRAMUSCULAR | Status: AC
  Administered 2019-03-09: 4 mg via INTRAVENOUS

## 2019-03-09 MED ORDER — ONDANSETRON HCL 4 MG/2ML IJ SOLN
4.0000 mg | Freq: Once | INTRAMUSCULAR | Status: AC
  Administered 2019-03-09: 4 mg via INTRAVENOUS
  Filled 2019-03-09: qty 2

## 2019-03-09 MED ORDER — SIMVASTATIN 10 MG PO TABS
10.0000 mg | ORAL_TABLET | Freq: Every day | ORAL | Status: DC
  Administered 2019-03-11: 10 mg via ORAL
  Filled 2019-03-09 (×4): qty 1

## 2019-03-09 MED ORDER — DEXTROSE-NACL 5-0.45 % IV SOLN
INTRAVENOUS | Status: AC
  Administered 2019-03-09 – 2019-03-10 (×2): via INTRAVENOUS

## 2019-03-09 MED ORDER — SODIUM CHLORIDE 0.9 % IV SOLN
INTRAVENOUS | Status: DC | PRN
  Administered 2019-03-09: 250 mL via INTRAVENOUS

## 2019-03-09 MED ORDER — ACETAMINOPHEN 325 MG PO TABS: 650.0000 mg | ORAL_TABLET | Freq: Four times a day (QID) | ORAL | Status: DC | PRN

## 2019-03-09 MED ORDER — IOHEXOL 9 MG/ML PO SOLN
500.0000 mL | ORAL | Status: AC
  Administered 2019-03-09 (×2): 500 mL via ORAL

## 2019-03-09 MED ORDER — HYDROCODONE-ACETAMINOPHEN 5-325 MG PO TABS: 1.0000 | ORAL_TABLET | ORAL | Status: DC | PRN

## 2019-03-09 MED ORDER — INSULIN ASPART 100 UNIT/ML ~~LOC~~ SOLN: 0.0000 [IU] | Freq: Three times a day (TID) | SUBCUTANEOUS | Status: DC

## 2019-03-09 MED ORDER — ONDANSETRON HCL 4 MG/2ML IJ SOLN
INTRAMUSCULAR | Status: AC
  Administered 2019-03-09: 08:00:00
  Filled 2019-03-09: qty 2

## 2019-03-09 NOTE — ED Provider Notes (Signed)
[facility] Emergency Department Provider Note   ____________________________________________   First MD Initiated Contact with Patient 03/09/19 [phone]     (approximate)  I have reviewed the triage vital signs and the nursing notes.   HISTORY  Chief Complaint Abdominal Pain    HPI Steven [name] is a 84 y.o. male who presents to the ED from home with a chief complaint of abdominal pain, nausea and vomiting.  Patient with a history of diverticular abscess in the 1990s status post surgical repair with 2 hospitalizations for SBO, last in 2015.  Reports daily BMs but only a small one yesterday morning.  Developed abdominal pain over the course of the day and nausea/vomiting overnight with abdominal distention.  Denies fever, cough, chest pain, shortness of breath.  Denies recent travel or trauma.       Past Medical History:  Diagnosis Date  . Allergy    enviromental  . Anemia 2017  . Anxiety   . Arthritis    ankylosing spodilitis  . Blood transfusion without reported diagnosis    during surgery  . Cataract   . Depression   . Dyspnea    with exertion - had Echo done 09/29/16  . History of kidney stones   . Hyperlipidemia   . Intestinal obstruction ([place])   . Pneumonia    as a child    Patient Active Problem List   Diagnosis Date Noted  . Anemia 12/31/2017  . Failed arthroplasty ([place]) 11/06/2016  . 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 Dyspnea on exertion 09/11/2016  . SBO (small bowel obstruction) ([place]) 03/21/2015  . Abdominal pain 04/10/2014  . Bowel obstruction ([place]) 04/10/2014  . Hyperlipidemia 04/10/2014  . History of ankylosing spondylitis 04/10/2014    Past Surgical History:  Procedure Laterality Date  . ABDOMINAL SURGERY     had abcess  . APPENDECTOMY    . CHOLECYSTECTOMY     with lysis of adhesions  . COLONOSCOPY    . COLOSTOMY    . COLOSTOMY REVERSAL    . EYE SURGERY Left    scar tissue removed from cornea   . EYE SURGERY Right    cataract surgery with lens implant  . JOINT REPLACEMENT Right    hip  x 2 1999 and 2007  . SHOULDER ARTHROSCOPY WITH ROTATOR CUFF REPAIR AND SUBACROMIAL DECOMPRESSION Left 03/02/2013   Procedure: LEFT SHOULDER ARTHROSCOPY WITH SUBACROMIAL DECOMPRESSION DISTAL CALVICLE RESECTION AND ROTATOR CUFF REPAIR ;  Surgeon: [name], MD;  Location: [facility];  Service: Orthopedics;  Laterality: Left;  . TOTAL HIP REVISION Right 11/06/2016   Procedure: TOTAL HIP REVISION OF THE ACETABULAR COMPONENT;  Surgeon: [name], MD;  Location: [facility];  Service: Orthopedics;  Laterality: Right;    Prior to Admission medications   Medication Sig Start Date End Date Taking? Authorizing Provider  Artificial Tear Solution (SOOTHE XP) SOLN Place 1 drop into both eyes 2 (two) times daily.   Yes [provider]  Biotin 5 MG CAPS Take 1 capsule by mouth daily.   Yes [provider]  Calcium Carbonate-Vitamin D 600-400 MG-UNIT tablet Take 1 tablet by mouth 2 (two) times daily.   Yes [provider]  cholecalciferol (VITAMIN D) 1000 units tablet Take 1,000 Units by mouth daily.   Yes [provider]  citalopram (CELEXA) 40 MG tablet Take 40 mg by mouth daily.   Yes [provider]  folic acid (FOLVITE) 1 MG tablet Take 1  mg by mouth daily.    Yes [provider]  Iron-FA-B Cmp-C-Biot-Probiotic (FUSION PLUS) CAPS Take 1 tablet by mouth daily.  09/14/16  Yes [provider]  methotrexate (RHEUMATREX) 2.5 MG tablet Take 25 mg by mouth once a week. Sunday 09/15/16  Yes [provider]  Multiple Vitamins-Minerals (PRESERVISION AREDS 2 PO) Take 1 tablet by mouth 2 (two) times daily.   Yes [provider]  prednisoLONE acetate (PRED FORTE) 1 % ophthalmic suspension Place 1 drop into the right eye 4 (four) times daily. 12/25/16  Yes [provider]  Probiotic Product (PROBIOTIC PO) Take 1 capsule by mouth daily.   Yes  [provider]  simvastatin (ZOCOR) 5 MG tablet Take 10 mg by mouth at bedtime.   Yes [provider]    Allergies Indomethacin and Lactose intolerance (gi)  Family History  Problem Relation Age of Onset  . Heart attack Mother   . Diabetes Mother   . Breast cancer Mother   . Heart attack Maternal Grandfather   . Pancreatic cancer Brother   . Colon cancer Neg Hx   . Esophageal cancer Neg Hx   . Stomach cancer Neg Hx     Social History Social History   Tobacco Use  . Smoking status: Former Research scientist (life sciences)  . Smokeless tobacco: Never Used  Substance Use Topics  . Alcohol use: Yes    Comment: 3 beers or glasses of wine a day--reports stopped ETOH 11/01/16   . Drug use: No    Review of Systems  Constitutional: No fever/chills Eyes: No visual changes. ENT: No sore throat. Cardiovascular: Denies chest pain. Respiratory: Denies shortness of breath. Gastrointestinal: Positive for abdominal pain, nausea and vomiting.  No diarrhea.  No constipation. Genitourinary: Negative for dysuria. Musculoskeletal: Negative for back pain. Skin: Negative for rash. Neurological: Negative for headaches, focal weakness or numbness.   ____________________________________________   PHYSICAL EXAM:  VITAL SIGNS: ED Triage Vitals  Enc Vitals Group     BP 03/09/19 0227 (!) 147/63     Pulse Rate 03/09/19 0227 76     Resp 03/09/19 0227 16     Temp 03/09/19 0227 97.6 F (36.4 C)     Temp src --      SpO2 03/09/19 0227 96 %     Weight 03/09/19 0151 147 lb (66.7 kg)     Height 03/09/19 0151 5\' 6"  (1.676 m)     Head Circumference --      Peak Flow --      Pain Score 03/09/19 0151 9     Pain Loc --      Pain Edu? --      Excl. in [place]? --     Constitutional: Alert and oriented. Well appearing and in mild acute distress. Eyes: Conjunctivae are normal. PERRL. EOMI. Head: Atraumatic. Nose: No congestion/rhinnorhea. Mouth/Throat: Mucous membranes are moist.  Oropharynx  non-erythematous. Neck: No stridor.   Cardiovascular: Normal rate, regular rhythm. Grossly normal heart sounds.  Good peripheral circulation. Respiratory: Normal respiratory effort.  No retractions. Lungs CTAB. Gastrointestinal: Soft with mild diffuse tenderness to palpation without rebound or guarding.  Mild to moderate distention. No abdominal bruits. No CVA tenderness. Musculoskeletal: No lower extremity tenderness nor edema.  No joint effusions. Neurologic:  Normal speech and language. No gross focal neurologic deficits are appreciated. No gait instability. Skin:  Skin is warm, dry and intact. No rash noted. Psychiatric: Mood and affect are normal. Speech and behavior are normal.  ____________________________________________  LABS (all labs ordered are listed, but only abnormal results are displayed)  Labs Reviewed  CBC WITH DIFFERENTIAL/PLATELET - Abnormal; Notable for the following components:      Result Value   WBC 14.3 (*)    RBC 4.03 (*)    Hemoglobin 11.1 (*)    HCT 35.8 (*)    RDW 16.0 (*)    Neutro Abs 12.4 (*)    All other components within normal limits  COMPREHENSIVE METABOLIC PANEL - Abnormal; Notable for the following components:   Glucose, Bld 197 (*)    All other components within normal limits  URINALYSIS, COMPLETE (UACMP) WITH MICROSCOPIC - Abnormal; Notable for the following components:   Color, Urine YELLOW (*)    APPearance HAZY (*)    All other components within normal limits  SARS CORONAVIRUS 2 (TAT 6-24 HRS)  LIPASE, BLOOD   ____________________________________________  EKG  ED ECG REPORT I, Jaunita Mikels J, the attending physician, personally viewed and interpreted this ECG.   Date: 03/09/2019  EKG Time: 0713  Rate: 80  Rhythm: normal EKG, normal sinus rhythm  Axis: Normal   Intervals:right bundle branch block  ST&T Change: Nonspecific  ____________________________________________  RADIOLOGY  ED MD interpretation: Developing  SBO  Official radiology report(s): Dg Abdomen 1 View  Result Date: 03/09/2019 CLINICAL DATA:  NG tube placement EXAM: ABDOMEN - 1 VIEW COMPARISON:  Radiograph 03/25/2015, CT abdomen pelvis 03/09/2019 FINDINGS: Transesophageal tube tip is in the gastric lumen while the side port remains at the GE junction. Gaseous distention of multiple upper abdominal bowel loops is seen. Atelectatic changes are present in the lung bases. The aorta is calcified. The remaining cardiomediastinal contours are otherwise unremarkable for limited portable technique. Cholecystectomy clips are present in the right upper quadrant. Dense bridging syndesmophytes of the imaged spine compatible with ankylosing spondylitis. IMPRESSION: 1. Transesophageal tube tip is in the gastric lumen while the side port remains at the GE junction. Recommend advancement at least 5 cm to position the side port well within the gastric lumen for optimal function. 2. Gaseous distention of multiple upper abdominal bowel loops compatible with early or developing obstruction seen on CT. 3. Bibasilar atelectasis. 4. Features of ankylosing spondylitis. 5.  Aortic Atherosclerosis (ICD10-I70.0). Electronically Signed   By: Lovena Le M.D.   On: 03/09/2019 06:42   Ct Abdomen Pelvis W Contrast  Result Date: 03/09/2019 CLINICAL DATA:  Lower abdominal pain with nausea and vomiting EXAM: CT ABDOMEN AND PELVIS WITH CONTRAST TECHNIQUE: Multidetector CT imaging of the abdomen and pelvis was performed using the standard protocol following bolus administration of intravenous contrast. CONTRAST:  168mL OMNIPAQUE IOHEXOL 300 MG/ML  SOLN COMPARISON:  None. FINDINGS: LOWER CHEST: No basilar pleural or apical pericardial effusion. HEPATOBILIARY: Normal hepatic contours. There is no intra- or extrahepatic biliary dilatation. Status post cholecystectomy. PANCREAS: Normal pancreatic contours without pancreatic ductal dilatation or peripancreatic fluid collection. SPLEEN:  Normal. ADRENALS/URINARY TRACT: --Adrenal glands: Normal. --Right kidney/ureter: Multiple calcifications, measuring up to 5 mm. No hydronephrosis, perinephric stranding or solid renal mass. --Left kidney/ureter: Multiple calcifications, measuring up to 3 mm. No hydronephrosis, perinephric stranding or solid renal mass. --Urinary bladder: Normal for degree of distention STOMACH/BOWEL: No hiatal hernia. Normal duodenal course. There are 2 anastomoses in the left lower quadrant, unchanged. There is mildly dilated fluid-filled bowel predominantly in the left lower quadrant. VASCULAR/LYMPHATIC: There is calcific atherosclerosis of the abdominal aorta. No abdominal or pelvic lymphadenopathy. REPRODUCTIVE: Normal prostate size with symmetric seminal vesicles. MUSCULOSKELETAL. Right total hip arthroplasty. There  is fusion at both sacroiliac joints. There is diffuse thoracolumbar ankylosis. OTHER: None. IMPRESSION: 1. Multiple mildly dilated loops of small bowel with air-fluid levels, greatest at the site of the anterior left lower quadrant anastomosis. This could indicate developing obstruction. 2. Ankylosing spondylitis. 3.  Aortic atherosclerosis (ICD10-I70.0). Electronically Signed   By: Ulyses Jarred M.D.   On: 03/09/2019 05:04    ____________________________________________   PROCEDURES  Procedure(s) performed (including Critical Care):  Procedures   ____________________________________________   INITIAL IMPRESSION / ASSESSMENT AND PLAN / ED COURSE  As part of my medical decision making, I reviewed the following data within the [facility] notes reviewed and incorporated, Labs reviewed, EKG interpreted, Old chart reviewed, Radiograph reviewed, Discussed with admitting physician and Notes from prior ED visits     Steven [name] was evaluated in Emergency Department on 03/09/2019 for the symptoms described in the history of present illness. He was evaluated in the context of  the global COVID-19 pandemic, which necessitated consideration that the patient might be at risk for infection with the SARS-CoV-2 virus that causes COVID-19. Institutional protocols and algorithms that pertain to the evaluation of patients at risk for COVID-19 are in a state of rapid change based on information released by regulatory bodies including the CDC and federal and state organizations. These policies and algorithms were followed during the patient's care in the ED.    84 year old male who presents with abdominal pain, nausea/vomiting. Differential diagnosis includes, but is not limited to, acute appendicitis, renal colic, testicular torsion, urinary tract infection/pyelonephritis, prostatitis,  epididymitis, diverticulitis, small bowel obstruction or ileus, colitis, abdominal aortic aneurysm, gastroenteritis, hernia, etc.  Laboratory results remarkable for moderate leukocytosis.  CT scan remarkable for developing SBO.  Patient requesting NG tube as he states this has relief of symptoms in the past.  Will initiate IV fluid resuscitation, IV analgesia/antiemetic.  Will discuss with hospitalist services to evaluate patient in emergency department for admission.      ____________________________________________   FINAL CLINICAL IMPRESSION(S) / ED DIAGNOSES  Final diagnoses:  Generalized abdominal pain  Nausea and vomiting, intractability of vomiting not specified, unspecified vomiting type  SBO (small bowel obstruction) [employer])     ED Discharge Orders    None       Note:  This document was prepared using Dragon voice recognition software and may include unintentional dictation errors.   Paulette Blanch, MD 03/09/19 [phone]

## 2019-03-09 NOTE — ED Notes (Signed)
Pt NG tube connected to wall suction

## 2019-03-09 NOTE — Consult Note (Signed)
Date of Consultation:  03/09/2019  Requesting Physician:  Gerlean Ren, MD  Reason for Consultation:  Small bowel obstruction  History of Present Illness: Steven [name] is a 84 y.o. male presenting with a 2-day history of abdominal pain, associated with nausea and vomiting.  Patient has a prior surgical history consistent of what looks to be a Hartman's procedure, followed by colostomy reversal, as well as a history of appendectomy and cholecystectomy.  He has had 2 prior episodes of small bowel obstruction and the last one was a few years ago.  His episodes in the past have resolved with conservative management with NG tube placement.  In the emergency room his work-up included CT scan of the abdomen pelvis which showed small bowel obstruction with transition point in the left lower quadrant.  I have independently viewed the images and agree with the findings.  He had NG tube placed in the emergency room and he started feeling better after placement.  Reports that his abdomen at first was distended and tight and now feels softer.  Denies any fevers at home, chest pain, shortness of breath.  Past Medical History: Past Medical History:  Diagnosis Date  . Allergy    enviromental  . Anemia 2017  . Anxiety   . Arthritis    ankylosing spodilitis  . Blood transfusion without reported diagnosis    during surgery  . Cataract   . Depression   . Dyspnea    with exertion - had Echo done 09/29/16  . History of kidney stones   . Hyperlipidemia   . Intestinal obstruction ([place])   . Pneumonia    as a child     Past Surgical History: Past Surgical History:  Procedure Laterality Date  . ABDOMINAL SURGERY     had abcess  . APPENDECTOMY    . CHOLECYSTECTOMY     with lysis of adhesions  . COLONOSCOPY    . COLOSTOMY    . COLOSTOMY REVERSAL    . EYE SURGERY Left    scar tissue removed from cornea  . EYE SURGERY Right    cataract surgery with lens implant  . JOINT REPLACEMENT Right    hip  x 2  1999 and 2007  . SHOULDER ARTHROSCOPY WITH ROTATOR CUFF REPAIR AND SUBACROMIAL DECOMPRESSION Left 03/02/2013   Procedure: LEFT SHOULDER ARTHROSCOPY WITH SUBACROMIAL DECOMPRESSION DISTAL CALVICLE RESECTION AND ROTATOR CUFF REPAIR ;  Surgeon: [name], MD;  Location: [facility];  Service: Orthopedics;  Laterality: Left;  . TOTAL HIP REVISION Right 11/06/2016   Procedure: TOTAL HIP REVISION OF THE ACETABULAR COMPONENT;  Surgeon: [name], MD;  Location: [facility];  Service: Orthopedics;  Laterality: Right;    Home Medications: Prior to Admission medications   Medication Sig Start Date End Date Taking? Authorizing Provider  Artificial Tear Solution (SOOTHE XP) SOLN Place 1 drop into both eyes 2 (two) times daily.   Yes [provider]  Biotin 5 MG CAPS Take 1 capsule by mouth daily.   Yes [provider]  Calcium Carbonate-Vitamin D 600-400 MG-UNIT tablet Take 1 tablet by mouth 2 (two) times daily.   Yes [provider]  cholecalciferol (VITAMIN D) 1000 units tablet Take 1,000 Units by mouth daily.   Yes [provider]  citalopram (CELEXA) 40 MG tablet Take 40 mg by mouth daily.   Yes [provider]  folic acid (FOLVITE) 1 MG tablet Take 1 mg by mouth daily.    Yes [provider]  Iron-FA-B Cmp-C-Biot-Probiotic (FUSION PLUS) CAPS Take 1 tablet by mouth daily.  09/14/16  Yes [provider]  methotrexate (RHEUMATREX) 2.5 MG tablet Take 25 mg by mouth once a week. Sunday 09/15/16  Yes [provider]  Multiple Vitamins-Minerals (PRESERVISION AREDS 2 PO) Take 1 tablet by mouth 2 (two) times daily.   Yes [provider]  prednisoLONE acetate (PRED FORTE) 1 % ophthalmic suspension Place 1 drop into the right eye 4 (four) times daily. 12/25/16  Yes [provider]  Probiotic Product (PROBIOTIC PO) Take 1 capsule by mouth daily.   Yes [provider]  simvastatin (ZOCOR) 5 MG tablet Take 10 mg by mouth at  bedtime.   Yes [provider]    Allergies: Allergies  Allergen Reactions  . Indomethacin Hives  . Lactose Intolerance (Gi)     GI UPSET    Social History:  reports that he has quit smoking. He has never used smokeless tobacco. He reports current alcohol use. He reports that he does not use drugs.   Family History: Family History  Problem Relation Age of Onset  . Heart attack Mother   . Diabetes Mother   . Breast cancer Mother   . Heart attack Maternal Grandfather   . Pancreatic cancer Brother   . Colon cancer Neg Hx   . Esophageal cancer Neg Hx   . Stomach cancer Neg Hx     Review of Systems: Review of Systems  Constitutional: Negative for chills and fever.  HENT: Negative for hearing loss.   Respiratory: Negative for shortness of breath.   Cardiovascular: Negative for chest pain.  Gastrointestinal: Positive for abdominal pain, nausea and vomiting.  Genitourinary: Negative for dysuria.  Musculoskeletal: Negative for myalgias.  Skin: Negative for rash.  Neurological: Negative for dizziness.  Psychiatric/Behavioral: Negative for depression.    Physical Exam BP (!) 127/47 (BP Location: Left Arm)   Pulse 72   Temp 98 F (36.7 C) (Oral)   Resp 16   Ht 5\' 6"  (1.676 m)   Wt 66.7 kg   SpO2 100%   BMI 23.73 kg/m  CONSTITUTIONAL: No acute distress HEENT:  Normocephalic, atraumatic, extraocular motion intact. NECK: Trachea is midline, and there is no jugular venous distension. RESPIRATORY:  Lungs are clear, and breath sounds are equal bilaterally. Normal respiratory effort without pathologic use of accessory muscles. CARDIOVASCULAR: Heart is regular without murmurs, gallops, or rubs. GI: The abdomen is soft, distended, with some tenderness to palpation in the low abdomen.  Prior surgical incisions are well-healed.  NG tube in place with bilious gastric contents.  MUSCULOSKELETAL:  Normal muscle strength and tone in all four extremities.  No peripheral edema  or cyanosis. SKIN: Skin turgor is normal. There are no pathologic skin lesions.  NEUROLOGIC:  Motor and sensation is grossly normal.  Cranial nerves are grossly intact. PSYCH:  Alert and oriented to person, place and time. Affect is normal.  Laboratory Analysis: Results for orders placed or performed during the hospital encounter of 03/09/19 (from the past 24 hour(s))  CBC with Differential     Status: Abnormal   Collection Time: 03/09/19  2:26 AM  Result Value Ref Range   WBC 14.3 (H) 4.0 - 10.5 K/uL   RBC 4.03 (L) 4.22 - 5.81 MIL/uL   Hemoglobin 11.1 (L) 13.0 - 17.0 g/dL   HCT 35.8 (L) 39.0 - 52.0 %   MCV 88.8 80.0 - 100.0 fL   MCH 27.5 26.0 - 34.0 pg   MCHC 31.0  30.0 - 36.0 g/dL   RDW 16.0 (H) 11.5 - 15.5 %   Platelets 374 150 - 400 K/uL   nRBC 0.0 0.0 - 0.2 %   Neutrophils Relative % 87 %   Neutro Abs 12.4 (H) 1.7 - 7.7 K/uL   Lymphocytes Relative 9 %   Lymphs Abs 1.3 0.7 - 4.0 K/uL   Monocytes Relative 3 %   Monocytes Absolute 0.4 0.1 - 1.0 K/uL   Eosinophils Relative 1 %   Eosinophils Absolute 0.1 0.0 - 0.5 K/uL   Basophils Relative 0 %   Basophils Absolute 0.0 0.0 - 0.1 K/uL   Immature Granulocytes 0 %   Abs Immature Granulocytes 0.04 0.00 - 0.07 K/uL  Comprehensive metabolic panel     Status: Abnormal   Collection Time: 03/09/19  2:26 AM  Result Value Ref Range   Sodium 140 135 - 145 mmol/L   Potassium 3.6 3.5 - 5.1 mmol/L   Chloride 100 98 - 111 mmol/L   CO2 26 22 - 32 mmol/L   Glucose, Bld 197 (H) 70 - 99 mg/dL   BUN 21 8 - 23 mg/dL   Creatinine, Ser 0.87 0.61 - 1.24 mg/dL   Calcium 9.6 8.9 - 10.3 mg/dL   Total Protein 8.0 6.5 - 8.1 g/dL   Albumin 4.6 3.5 - 5.0 g/dL   AST 23 15 - 41 U/L   ALT 19 0 - 44 U/L   Alkaline Phosphatase 44 38 - 126 U/L   Total Bilirubin 0.9 0.3 - 1.2 mg/dL   GFR calc non Af Amer >60 >60 mL/min   GFR calc Af Amer >60 >60 mL/min   Anion gap 14 5 - 15  Lipase, blood     Status: None   Collection Time: 03/09/19  2:26 AM  Result  Value Ref Range   Lipase 50 11 - 51 U/L  Urinalysis, Complete w Microscopic     Status: Abnormal   Collection Time: 03/09/19  2:26 AM  Result Value Ref Range   Color, Urine YELLOW (A) YELLOW   APPearance HAZY (A) CLEAR   Specific Gravity, Urine 1.018 1.005 - 1.030   pH 6.0 5.0 - 8.0   Glucose, UA NEGATIVE NEGATIVE mg/dL   Hgb urine dipstick NEGATIVE NEGATIVE   Bilirubin Urine NEGATIVE NEGATIVE   Ketones, ur NEGATIVE NEGATIVE mg/dL   Protein, ur NEGATIVE NEGATIVE mg/dL   Nitrite NEGATIVE NEGATIVE   Leukocytes,Ua NEGATIVE NEGATIVE   RBC / HPF 0-5 0 - 5 RBC/hpf   WBC, UA 0-5 0 - 5 WBC/hpf   Bacteria, UA NONE SEEN NONE SEEN   Squamous Epithelial / LPF 0-5 0 - 5   Mucus PRESENT   Glucose, capillary     Status: Abnormal   Collection Time: 03/09/19 11:57 AM  Result Value Ref Range   Glucose-Capillary 119 (H) 70 - 99 mg/dL   Comment 1 Notify RN     Imaging: Dg Abdomen 1 View  Result Date: 03/09/2019 CLINICAL DATA:  NG tube placement EXAM: ABDOMEN - 1 VIEW COMPARISON:  Radiograph 03/25/2015, CT abdomen pelvis 03/09/2019 FINDINGS: Transesophageal tube tip is in the gastric lumen while the side port remains at the GE junction. Gaseous distention of multiple upper abdominal bowel loops is seen. Atelectatic changes are present in the lung bases. The aorta is calcified. The remaining cardiomediastinal contours are otherwise unremarkable for limited portable technique. Cholecystectomy clips are present in the right upper quadrant. Dense bridging syndesmophytes of the imaged spine compatible with ankylosing spondylitis. IMPRESSION:  1. Transesophageal tube tip is in the gastric lumen while the side port remains at the GE junction. Recommend advancement at least 5 cm to position the side port well within the gastric lumen for optimal function. 2. Gaseous distention of multiple upper abdominal bowel loops compatible with early or developing obstruction seen on CT. 3. Bibasilar atelectasis. 4. Features  of ankylosing spondylitis. 5.  Aortic Atherosclerosis (ICD10-I70.0). Electronically Signed   By: Lovena Le M.D.   On: 03/09/2019 06:42   Ct Abdomen Pelvis W Contrast  Result Date: 03/09/2019 CLINICAL DATA:  Lower abdominal pain with nausea and vomiting EXAM: CT ABDOMEN AND PELVIS WITH CONTRAST TECHNIQUE: Multidetector CT imaging of the abdomen and pelvis was performed using the standard protocol following bolus administration of intravenous contrast. CONTRAST:  176mL OMNIPAQUE IOHEXOL 300 MG/ML  SOLN COMPARISON:  None. FINDINGS: LOWER CHEST: No basilar pleural or apical pericardial effusion. HEPATOBILIARY: Normal hepatic contours. There is no intra- or extrahepatic biliary dilatation. Status post cholecystectomy. PANCREAS: Normal pancreatic contours without pancreatic ductal dilatation or peripancreatic fluid collection. SPLEEN: Normal. ADRENALS/URINARY TRACT: --Adrenal glands: Normal. --Right kidney/ureter: Multiple calcifications, measuring up to 5 mm. No hydronephrosis, perinephric stranding or solid renal mass. --Left kidney/ureter: Multiple calcifications, measuring up to 3 mm. No hydronephrosis, perinephric stranding or solid renal mass. --Urinary bladder: Normal for degree of distention STOMACH/BOWEL: No hiatal hernia. Normal duodenal course. There are 2 anastomoses in the left lower quadrant, unchanged. There is mildly dilated fluid-filled bowel predominantly in the left lower quadrant. VASCULAR/LYMPHATIC: There is calcific atherosclerosis of the abdominal aorta. No abdominal or pelvic lymphadenopathy. REPRODUCTIVE: Normal prostate size with symmetric seminal vesicles. MUSCULOSKELETAL. Right total hip arthroplasty. There is fusion at both sacroiliac joints. There is diffuse thoracolumbar ankylosis. OTHER: None. IMPRESSION: 1. Multiple mildly dilated loops of small bowel with air-fluid levels, greatest at the site of the anterior left lower quadrant anastomosis. This could indicate developing  obstruction. 2. Ankylosing spondylitis. 3.  Aortic atherosclerosis (ICD10-I70.0). Electronically Signed   By: Ulyses Jarred M.D.   On: 03/09/2019 05:04    Assessment and Plan: This is a 84 y.o. male presenting with recurrent episode of small bowel obstruction.  -Discussed with the patient that we would attempt to do again conservative management with NG tube placement, n.p.o. diet, IV fluid hydration, and appropriate pain and nausea control.  We will attempt this for the next 48 to 72 hours but if there is no improvement, the patient is aware that he may need surgery.  In the meantime we will order a KUB for tomorrow to assess his progress.  Face-to-face time spent with the patient and care providers was 55 minutes, with more than 50% of the time spent counseling, educating, and coordinating care of the patient.     Melvyn Neth, MD [facility] Pg:  [phone]

## 2019-03-09 NOTE — ED Notes (Signed)
Pt vomited immed after drinking 2 bottles of contrast; MD notified

## 2019-03-09 NOTE — ED Triage Notes (Addendum)
Patient ambulatory to triage with steady gait, without difficulty or distress noted; pt reports lower abd pain since yesterday accomp by N/V; last BM yesterday am

## 2019-03-09 NOTE — H&P (Signed)
History and Physical    Steven [name] [id number] DOB: 1934/11/14 DOA: 03/09/2019  PCP: [name], MD Patient coming from: Home  Chief Complaint: Abdominal pain  HPI: Steven [name] is a 84 y.o. male with medical history significant o hyperlipidemia, depression, ankylosing spondylitis, cholecystectomy/appendectomy/colectomy/reversal of colostomy coming to the hospital with complains of abdominal pain, nausea.  Patient states back in the 90s he had abscess in his colon for which he had colectomy and then later reversal of his colostomy, later on cholecystectomy and appendectomy.  Since then he has suffered from at least 3-4 episodes of small bowel obstructions, last one being about 2 years ago.  This time symptoms started about 2 days ago and has been having some nausea and progressive worsening of his abdominal discomfort.  Unable to pass gas during this time.  In the ER his vital signs are stable, his labs are overall unremarkable.  CT of the abdomen pelvis that showed multiple dilated small bowel loops concerning for developing obstruction.  NG tube was placed with significant amount of output.  General surgery was consulted.   Review of Systems: As per HPI otherwise 10 point review of systems negative.  Review of Systems Otherwise negative except as per HPI, including: General: Denies fever, chills, night sweats or unintended weight loss. Resp: Denies cough, wheezing, shortness of breath. Cardiac: Denies chest pain, palpitations, orthopnea, paroxysmal nocturnal dyspnea. GI: Denies diarrhea or constipation GU: Denies dysuria, frequency, hesitancy or incontinence MS: Denies muscle aches, joint pain or swelling Neuro: Denies headache, neurologic deficits (focal weakness, numbness, tingling), abnormal gait Psych: Denies anxiety, depression, SI/HI/AVH Skin: Denies new rashes or lesions ID: Denies sick contacts, exotic exposures, travel  Past Medical History:  Diagnosis Date  .  Allergy    enviromental  . Anemia 2017  . Anxiety   . Arthritis    ankylosing spodilitis  . Blood transfusion without reported diagnosis    during surgery  . Cataract   . Depression   . Dyspnea    with exertion - had Echo done 09/29/16  . History of kidney stones   . Hyperlipidemia   . Intestinal obstruction ([place])   . Pneumonia    as a child    Past Surgical History:  Procedure Laterality Date  . ABDOMINAL SURGERY     had abcess  . APPENDECTOMY    . CHOLECYSTECTOMY     with lysis of adhesions  . COLONOSCOPY    . COLOSTOMY    . COLOSTOMY REVERSAL    . EYE SURGERY Left    scar tissue removed from cornea  . EYE SURGERY Right    cataract surgery with lens implant  . JOINT REPLACEMENT Right    hip  x 2 1999 and 2007  . SHOULDER ARTHROSCOPY WITH ROTATOR CUFF REPAIR AND SUBACROMIAL DECOMPRESSION Left 03/02/2013   Procedure: LEFT SHOULDER ARTHROSCOPY WITH SUBACROMIAL DECOMPRESSION DISTAL CALVICLE RESECTION AND ROTATOR CUFF REPAIR ;  Surgeon: [name], MD;  Location: [facility];  Service: Orthopedics;  Laterality: Left;  . TOTAL HIP REVISION Right 11/06/2016   Procedure: TOTAL HIP REVISION OF THE ACETABULAR COMPONENT;  Surgeon: [name], MD;  Location: [facility];  Service: Orthopedics;  Laterality: Right;    SOCIAL HISTORY:  reports that he has quit smoking. He has never used smokeless tobacco. He reports current alcohol use. He reports that he does not use drugs.  Allergies  Allergen Reactions  . Indomethacin Hives  . Lactose Intolerance (Gi)     GI  UPSET    FAMILY HISTORY: Family History  Problem Relation Age of Onset  . Heart attack Mother   . Diabetes Mother   . Breast cancer Mother   . Heart attack Maternal Grandfather   . Pancreatic cancer Brother   . Colon cancer Neg Hx   . Esophageal cancer Neg Hx   . Stomach cancer Neg Hx      Prior to Admission medications   Medication Sig Start Date End Date Taking? Authorizing Provider  Artificial Tear Solution  (SOOTHE XP) SOLN Place 1 drop into both eyes 2 (two) times daily.   Yes [provider]  Biotin 5 MG CAPS Take 1 capsule by mouth daily.   Yes [provider]  Calcium Carbonate-Vitamin D 600-400 MG-UNIT tablet Take 1 tablet by mouth 2 (two) times daily.   Yes [provider]  cholecalciferol (VITAMIN D) 1000 units tablet Take 1,000 Units by mouth daily.   Yes [provider]  citalopram (CELEXA) 40 MG tablet Take 40 mg by mouth daily.   Yes [provider]  folic acid (FOLVITE) 1 MG tablet Take 1 mg by mouth daily.    Yes [provider]  Iron-FA-B Cmp-C-Biot-Probiotic (FUSION PLUS) CAPS Take 1 tablet by mouth daily.  09/14/16  Yes [provider]  methotrexate (RHEUMATREX) 2.5 MG tablet Take 25 mg by mouth once a week. Sunday 09/15/16  Yes [provider]  Multiple Vitamins-Minerals (PRESERVISION AREDS 2 PO) Take 1 tablet by mouth 2 (two) times daily.   Yes [provider]  prednisoLONE acetate (PRED FORTE) 1 % ophthalmic suspension Place 1 drop into the right eye 4 (four) times daily. 12/25/16  Yes [provider]  Probiotic Product (PROBIOTIC PO) Take 1 capsule by mouth daily.   Yes [provider]  simvastatin (ZOCOR) 5 MG tablet Take 10 mg by mouth at bedtime.   Yes [provider]    Physical Exam: Vitals:   03/09/19 0830 03/09/19 0845 03/09/19 0900 03/09/19 0942  BP: (!) 157/76 138/61 (!) 144/73 (!) 160/76  Pulse: 82   73  Resp: 13 14 17 16   Temp:    99.3 F (37.4 C)  TempSrc:    Oral  SpO2: 98%   97%  Weight:      Height:          Constitutional: NAD, calm, comfortable, NG tube currently in place Eyes: PERRL, lids and conjunctivae normal ENMT: Mucous membranes are moist. Posterior pharynx clear of any exudate or lesions.Normal dentition.  Neck: normal, supple, no masses, no thyromegaly Respiratory: clear to auscultation bilaterally, no wheezing, no crackles. Normal  respiratory effort. No accessory muscle use.  Cardiovascular: Regular rate and rhythm, no murmurs / rubs / gallops. No extremity edema. 2+ pedal pulses. No carotid bruits.  Abdomen: Diminished bowel sounds Musculoskeletal: no clubbing / cyanosis. No joint deformity upper and lower extremities. Good ROM, no contractures. Normal muscle tone.  Skin: no rashes, lesions, ulcers. No induration Neurologic: CN 2-12 grossly intact. Sensation intact, DTR normal. Strength 5/5 in all 4.  Psychiatric: Normal judgment and insight. Alert and oriented x 3. Normal mood.     Labs on Admission: I have personally reviewed following labs and imaging studies  CBC: Recent Labs  Lab 03/09/19 0226  WBC 14.3*  NEUTROABS 12.4*  HGB 11.1*  HCT 35.8*  MCV 88.8  PLT [id number]   Basic Metabolic Panel: Recent Labs  Lab 03/09/19 0226  NA 140  K 3.6  CL 100  CO2 26  GLUCOSE 197*  BUN 21  CREATININE 0.87  CALCIUM 9.6   GFR: Estimated Creatinine Clearance: 59.1 mL/min (by C-G formula based on SCr of 0.87 mg/dL). Liver Function Tests: Recent Labs  Lab 03/09/19 0226  AST 23  ALT 19  ALKPHOS 44  BILITOT 0.9  PROT 8.0  ALBUMIN 4.6   Recent Labs  Lab 03/09/19 0226  LIPASE 50   No results for input(s): AMMONIA in the last 168 hours. Coagulation Profile: No results for input(s): INR, PROTIME in the last 168 hours. Cardiac Enzymes: No results for input(s): CKTOTAL, CKMB, CKMBINDEX, TROPONINI in the last 168 hours. BNP (last 3 results) No results for input(s): PROBNP in the last 8760 hours. HbA1C: No results for input(s): HGBA1C in the last 72 hours. CBG: No results for input(s): GLUCAP in the last 168 hours. Lipid Profile: No results for input(s): CHOL, HDL, LDLCALC, TRIG, CHOLHDL, LDLDIRECT in the last 72 hours. Thyroid Function Tests: No results for input(s): TSH, T4TOTAL, FREET4, T3FREE, THYROIDAB in the last 72 hours. Anemia Panel: No results for input(s): VITAMINB12, FOLATE, FERRITIN, TIBC,  IRON, RETICCTPCT in the last 72 hours. Urine analysis:    Component Value Date/Time   COLORURINE YELLOW (A) 03/09/2019 0226   APPEARANCEUR HAZY (A) 03/09/2019 0226   APPEARANCEUR Hazy 04/18/2012 2324   LABSPEC 1.018 03/09/2019 0226   LABSPEC 1.018 04/18/2012 2324   PHURINE 6.0 03/09/2019 0226   GLUCOSEU NEGATIVE 03/09/2019 0226   GLUCOSEU Negative 04/18/2012 2324   HGBUR NEGATIVE 03/09/2019 0226   BILIRUBINUR NEGATIVE 03/09/2019 0226   BILIRUBINUR Negative 04/18/2012 2324   KETONESUR NEGATIVE 03/09/2019 0226   PROTEINUR NEGATIVE 03/09/2019 0226   UROBILINOGEN 0.2 04/09/2014 2138   NITRITE NEGATIVE 03/09/2019 0226   LEUKOCYTESUR NEGATIVE 03/09/2019 0226   LEUKOCYTESUR Negative 04/18/2012 2324   Sepsis Labs: !!!!!!!!!!!!!!!!!!!!!!!!!!!!!!!!!!!!!!!!!!!! @LABRCNTIP (procalcitonin:4,lacticidven:4) )No results found for this or any previous visit (from the past 240 hour(s)).   Radiological Exams on Admission: Dg Abdomen 1 View  Result Date: 03/09/2019 CLINICAL DATA:  NG tube placement EXAM: ABDOMEN - 1 VIEW COMPARISON:  Radiograph 03/25/2015, CT abdomen pelvis 03/09/2019 FINDINGS: Transesophageal tube tip is in the gastric lumen while the side port remains at the GE junction. Gaseous distention of multiple upper abdominal bowel loops is seen. Atelectatic changes are present in the lung bases. The aorta is calcified. The remaining cardiomediastinal contours are otherwise unremarkable for limited portable technique. Cholecystectomy clips are present in the right upper quadrant. Dense bridging syndesmophytes of the imaged spine compatible with ankylosing spondylitis. IMPRESSION: 1. Transesophageal tube tip is in the gastric lumen while the side port remains at the GE junction. Recommend advancement at least 5 cm to position the side port well within the gastric lumen for optimal function. 2. Gaseous distention of multiple upper abdominal bowel loops compatible with early or developing obstruction  seen on CT. 3. Bibasilar atelectasis. 4. Features of ankylosing spondylitis. 5.  Aortic Atherosclerosis (ICD10-I70.0). Electronically Signed   By: Lovena Le M.D.   On: 03/09/2019 06:42   Ct Abdomen Pelvis W Contrast  Result Date: 03/09/2019 CLINICAL DATA:  Lower abdominal pain with nausea and vomiting EXAM: CT ABDOMEN AND PELVIS WITH CONTRAST TECHNIQUE: Multidetector CT imaging of the abdomen and pelvis was performed using the standard protocol following bolus administration of intravenous contrast. CONTRAST:  143mL OMNIPAQUE IOHEXOL 300 MG/ML  SOLN COMPARISON:  None. FINDINGS: LOWER CHEST: No basilar pleural or apical pericardial effusion. HEPATOBILIARY: Normal hepatic contours. There is no intra- or extrahepatic biliary dilatation.  Status post cholecystectomy. PANCREAS: Normal pancreatic contours without pancreatic ductal dilatation or peripancreatic fluid collection. SPLEEN: Normal. ADRENALS/URINARY TRACT: --Adrenal glands: Normal. --Right kidney/ureter: Multiple calcifications, measuring up to 5 mm. No hydronephrosis, perinephric stranding or solid renal mass. --Left kidney/ureter: Multiple calcifications, measuring up to 3 mm. No hydronephrosis, perinephric stranding or solid renal mass. --Urinary bladder: Normal for degree of distention STOMACH/BOWEL: No hiatal hernia. Normal duodenal course. There are 2 anastomoses in the left lower quadrant, unchanged. There is mildly dilated fluid-filled bowel predominantly in the left lower quadrant. VASCULAR/LYMPHATIC: There is calcific atherosclerosis of the abdominal aorta. No abdominal or pelvic lymphadenopathy. REPRODUCTIVE: Normal prostate size with symmetric seminal vesicles. MUSCULOSKELETAL. Right total hip arthroplasty. There is fusion at both sacroiliac joints. There is diffuse thoracolumbar ankylosis. OTHER: None. IMPRESSION: 1. Multiple mildly dilated loops of small bowel with air-fluid levels, greatest at the site of the anterior left lower quadrant  anastomosis. This could indicate developing obstruction. 2. Ankylosing spondylitis. 3.  Aortic atherosclerosis (ICD10-I70.0). Electronically Signed   By: Ulyses Jarred M.D.   On: 03/09/2019 05:04     All images have been reviewed by me personally.    Assessment/Plan Principal Problem:   Bowel obstruction (HCC) Active Problems:   Abdominal pain   Hyperlipidemia   History of ankylosing spondylitis   SBO (small bowel obstruction) (HCC)   Anemia   Small bowel obstruction (HCC)   Early small bowel obstruction History of appendectomy, cholecystectomy and colectomy -Admit the patient to the hospital.  Suspect secondary to adhesions -NG tube in place, monitor output.  N.p.o., every 6 hours Accu-Chek -Supportive care, monitor electrolytes. -General surgery consulted -Serial abdominal imaging -Gentle hydration D5 half-normal saline  Hyperlipidemia -On statin at home.  Depression -On Celexa.  On hold.  History of ankylosing spondylitis -Hold methotrexate on hold.   DVT prophylaxis: Subcu heparin Code Status: Full code Family Communication: None at bedside Disposition Plan: TBD  Consults called: General surgery Admission status: Admit the patient to the hospital to [place] floor.  High risk patient given previous multiple surgeries and history of obstructions in the past.  General surgery consulted.  Anticipated needs more than 2 days before resolution of his symptoms.   Time Spent: 65 minutes.  >50% of the time was devoted to discussing the patients care, assessment, plan and disposition with other care givers along with counseling the patient about the risks and benefits of treatment.    Ankit Arsenio Loader MD [facility]  If 7PM-7AM, please contact night-coverage   03/09/2019, 9:59 AM

## 2019-03-09 NOTE — Plan of Care (Addendum)
84 yo male with hyperlipidemia, diverticular abscess, ccy, appy, h/o colostomy w reversal, apparently presents with lower abdominal pain, and n/v.     CT abd/ pelvis IMPRESSION: 1. Multiple mildly dilated loops of small bowel with air-fluid levels, greatest at the site of the anterior left lower quadrant anastomosis. This could indicate developing obstruction. 2. Ankylosing spondylitis.  Wbc 14.3, Hgb 11.1, Plt 374 Bun 21, Creatinine 0.87 Ast 23, Alt 19 Lipase 50 Glucose 197  Urinalysis negative  ED requests admission for possible early SBO

## 2019-03-09 NOTE — ED Notes (Signed)
Pt returned from CT with extravasation of contrast during CT. IV removed, ICE applied, extremity elevated

## 2019-03-10 ENCOUNTER — Inpatient Hospital Stay: Payer: Medicare Other

## 2019-03-10 DIAGNOSIS — D649 Anemia, unspecified: Secondary | ICD-10-CM

## 2019-03-10 DIAGNOSIS — R1084 Generalized abdominal pain: Secondary | ICD-10-CM

## 2019-03-10 LAB — GLUCOSE, CAPILLARY
Glucose-Capillary: 120 mg/dL — ABNORMAL HIGH (ref 70–99)
Glucose-Capillary: 126 mg/dL — ABNORMAL HIGH (ref 70–99)
Glucose-Capillary: 111 mg/dL — ABNORMAL HIGH (ref 70–99)
Glucose-Capillary: 124 mg/dL — ABNORMAL HIGH (ref 70–99)

## 2019-03-10 LAB — CBC
HCT: 29.9 % — ABNORMAL LOW (ref 39.0–52.0)
Hemoglobin: 9.4 g/dL — ABNORMAL LOW (ref 13.0–17.0)
MCH: 27.6 pg (ref 26.0–34.0)
MCHC: 31.4 g/dL (ref 30.0–36.0)
MCV: 87.9 fL (ref 80.0–100.0)
Platelets: 294 10*3/uL (ref 150–400)
RBC: 3.4 MIL/uL — ABNORMAL LOW (ref 4.22–5.81)
RDW: 16.3 % — ABNORMAL HIGH (ref 11.5–15.5)
WBC: 5.7 10*3/uL (ref 4.0–10.5)
nRBC: 0 % (ref 0.0–0.2)

## 2019-03-10 LAB — COMPREHENSIVE METABOLIC PANEL
ALT: 13 U/L (ref 0–44)
AST: 20 U/L (ref 15–41)
Albumin: 3.5 g/dL (ref 3.5–5.0)
Alkaline Phosphatase: 33 U/L — ABNORMAL LOW (ref 38–126)
Anion gap: 10 (ref 5–15)
BUN: 12 mg/dL (ref 8–23)
CO2: 26 mmol/L (ref 22–32)
Calcium: 8.3 mg/dL — ABNORMAL LOW (ref 8.9–10.3)
Chloride: 101 mmol/L (ref 98–111)
Creatinine, Ser: 0.83 mg/dL (ref 0.61–1.24)
GFR calc Af Amer: >60 mL/min (ref >60)
GFR calc non Af Amer: >60 mL/min (ref >60)
Glucose, Bld: 133 mg/dL — ABNORMAL HIGH (ref 70–99)
Potassium: 3.5 mmol/L (ref 3.5–5.1)
Sodium: 137 mmol/L (ref 135–145)
Total Bilirubin: 1.2 mg/dL (ref 0.3–1.2)
Total Protein: 6.3 g/dL — ABNORMAL LOW (ref 6.5–8.1)

## 2019-03-10 LAB — APTT: aPTT: 34 seconds (ref 24–36)

## 2019-03-10 LAB — PROTIME-INR
INR: 1.1 (ref 0.8–1.2)
Prothrombin Time: 13.8 seconds (ref 11.4–15.2)

## 2019-03-10 MED ORDER — DIATRIZOATE MEGLUMINE & SODIUM 66-10 % PO SOLN
90.0000 mL | Freq: Once | ORAL | Status: AC
  Administered 2019-03-10: 90 mL via NASOGASTRIC

## 2019-03-10 MED ORDER — DEXTROSE-NACL 5-0.45 % IV SOLN
INTRAVENOUS | Status: AC
  Administered 2019-03-10 – 2019-03-11 (×2): via INTRAVENOUS

## 2019-03-10 MED ORDER — INSULIN ASPART 100 UNIT/ML ~~LOC~~ SOLN: 0.0000 [IU] | Freq: Four times a day (QID) | SUBCUTANEOUS | Status: DC

## 2019-03-10 NOTE — Progress Notes (Signed)
Subjective:  CC: Steven [name] is a 84 y.o. male  Hospital stay day 1,   SBO  HPI: No acute issues overnight.  Still denies any flatus or bowel movements.  ROS:  General: Denies weight loss, weight gain, fatigue, fevers, chills, and night sweats. Heart: Denies chest pain, palpitations, racing heart, irregular heartbeat, leg pain or swelling, and decreased activity tolerance. Respiratory: Denies breathing difficulty, shortness of breath, wheezing, cough, and sputum. GI: Denies change in appetite, heartburn, nausea, vomiting, constipation, diarrhea, and blood in stool. GU: Denies difficulty urinating, pain with urinating, urgency, frequency, blood in urine.   Objective:   Temp:  [98 F (36.7 C)-98.7 F (37.1 C)] 98.7 F (37.1 C) (11/27 0429) Pulse Rate:  [63-72] 63 (11/27 0429) Resp:  [16-20] 20 (11/27 0429) BP: (101-127)/(47-66) 101/59 (11/27 0429) SpO2:  [95 %-100 %] 95 % (11/27 0429)     Height: 5\' 6"  (167.6 cm) Weight: 66.7 kg BMI (Calculated): 23.74   Intake/Output this shift:   Intake/Output Summary (Last 24 hours) at 03/10/2019 1052 Last data filed at 03/10/2019 [id number] Gross per 24 hour  Intake 1271.43 ml  Output 2200 ml  Net -928.57 ml    Constitutional :  alert, cooperative, appears stated age and no distress  Respiratory:  clear to auscultation bilaterally  Cardiovascular:  regular rate and rhythm  Gastrointestinal: Soft, no guarding, slight tenderness to palpation in right lower quadrant.  Distended with positive tympany..   Skin: Cool and moist.   Psychiatric: Normal affect, non-agitated, not confused       LABS:  CMP Latest Ref Rng & Units 03/10/2019 03/09/2019 12/07/2017  Glucose 70 - 99 mg/dL 133(H) 197(H) 86  BUN 8 - 23 mg/dL 12 21 18   Creatinine 0.61 - 1.24 mg/dL 0.83 0.87 0.82  Sodium 135 - 145 mmol/L 137 140 141  Potassium 3.5 - 5.1 mmol/L 3.5 3.6 4.4  Chloride 98 - 111 mmol/L 101 100 103  CO2 22 - 32 mmol/L 26 26 31   Calcium 8.9 - 10.3 mg/dL 8.3(L)  9.6 9.7  Total Protein 6.5 - 8.1 g/dL 6.3(L) 8.0 7.3  Total Bilirubin 0.3 - 1.2 mg/dL 1.2 0.9 0.4  Alkaline Phos 38 - 126 U/L 33(L) 44 73  AST 15 - 41 U/L 20 23 25   ALT 0 - 44 U/L 13 19 20    CBC Latest Ref Rng & Units 03/10/2019 03/09/2019 12/07/2017  WBC 4.0 - 10.5 K/uL 5.7 14.3(H) 8.0  Hemoglobin 13.0 - 17.0 g/dL 9.4(L) 11.1(L) 11.9(L)  Hematocrit 39.0 - 52.0 % 29.9(L) 35.8(L) 36.6(L)  Platelets 150 - 400 K/uL 294 374 352    RADS: CLINICAL DATA:  Small bowel obstruction  EXAM: ABDOMEN - 1 VIEW  COMPARISON:  CT abdomen and pelvis March 09, 2019; abdominal radiograph March 09, 2019  FINDINGS: Nasogastric tube tip and side port are in the body of the stomach. There are loops of mildly dilated bowel. No air-fluid levels. Air is noted in the rectum. No free air. There are surgical clips in the right upper quadrant. Patient is status post total hip replacement on the right. Lung bases are clear.  IMPRESSION: Nasogastric tube tip and side port in stomach. There remain loops of mildly dilated bowel, suggesting partially resolving bowel obstruction. No free air evident. Visualized lung bases clear.   Electronically Signed   By: Lowella Grip III M.D.   On: 03/10/2019 07:04 Assessment:   Small bowel obstruction.  Stable for now.  Will recommend a small bowel follow through protocol  to see if we can pinpoint area of obstruction if patient does not of needing surgery.  Contrast will hopefully also be therapeutic to avoid surgery.  In the meantime, continue current management, encourage ambulation, gum and hard candy.

## 2019-03-10 NOTE — Progress Notes (Signed)
PROGRESS NOTE    Steven [name]  [id number] DOB: [birth year] DOA: 03/09/2019 PCP: [name], MD   Brief Narrative:  Per admitting physician: Steven [name] is a 84 y.o. male with medical history significant o hyperlipidemia, depression, ankylosing spondylitis, cholecystectomy/appendectomy/colectomy/reversal of colostomy coming to the hospital with complains of abdominal pain, nausea.  Patient states back in the 90s he had abscess in his colon for which he had colectomy and then later reversal of his colostomy, later on cholecystectomy and appendectomy.  Since then he has suffered from at least 3-4 episodes of small bowel obstructions, last one being about 2 years ago.  This time symptoms started about 2 days ago and has been having some nausea and progressive worsening of his abdominal discomfort.  Unable to pass gas during this time.  In the ER his vital signs are stable, his labs are overall unremarkable.  CT of the abdomen pelvis that showed multiple dilated small bowel loops concerning for developing obstruction.  NG tube was placed with significant amount of output.  General surgery was consulted.   Assessment & Plan:   Principal Problem:   Bowel obstruction (HCC) Active Problems:   Abdominal pain   Hyperlipidemia   History of ankylosing spondylitis   SBO (small bowel obstruction) (HCC)   Anemia   Small bowel obstruction (HCC)   Early small bowel obstruction History of appendectomy, cholecystectomy and colectomy -Admitteed inpt.   -NG tube in place, monitoring output.  N.p.o., every 6 hours Accu-Chek -With no flatus or BM -Supportive care, monitor electrolytes. -General surgery consulted, agree with conservative medical management -Serial abdominal imaging -Gentle hydration D5 half-normal saline  Hyperlipidemia -On statin at home. -Continue on discharge  Depression -On Celexa.  On hold. -Continue on discharge  History of ankylosing spondylitis -Hold  methotrexate on hold. -Resume on discharge  DVT prophylaxis: Heparin SQ  Code Status: Full code    Code Status Orders  (From admission, onward)         Start     Ordered   03/09/19 0738  Full code  Continuous     03/09/19 0739        Code Status History    Date Active Date Inactive Code Status Order ID Comments User Context   11/06/2016 1539 11/08/2016 2018 Full Code [id number]  [facility] In[facility]   03/21/2015 1052 03/27/2015 1554 Full Code [id number]  [facility]   04/10/2014 0647 04/12/2014 1747 Full Code [id number]  Rise Patience, MD Inpatient   Advance Care Planning Activity     Family Communication: Spoke with wife answered all questions Disposition Plan:   Patient remained inpatient for continued treatment of small bowel obstruction, NG tube on suction, IV fluids, expert subspecialty consultation.  Patient not stable for discharge Consults called: None Admission status: Inpatient   Consultants:   General surgery  Procedures:  Dg Abd 1 View  Result Date: 03/10/2019 CLINICAL DATA:  Small bowel obstruction EXAM: ABDOMEN - 1 VIEW COMPARISON:  CT abdomen and pelvis March 09, 2019; abdominal radiograph March 09, 2019 FINDINGS: Nasogastric tube tip and side port are in the body of the stomach. There are loops of mildly dilated bowel. No air-fluid levels. Air is noted in the rectum. No free air. There are surgical clips in the right upper quadrant. Patient is status post total hip replacement on the right. Lung bases are clear. IMPRESSION: Nasogastric tube tip and side port in stomach. There remain loops of mildly dilated bowel,  suggesting partially resolving bowel obstruction. No free air evident. Visualized lung bases clear. Electronically Signed   By: Lowella Grip III M.D.   On: 03/10/2019 07:04   Dg Abdomen 1 View  Result Date: 03/09/2019 CLINICAL DATA:  NG tube placement EXAM: ABDOMEN - 1 VIEW COMPARISON:  Radiograph 03/25/2015, CT  abdomen pelvis 03/09/2019 FINDINGS: Transesophageal tube tip is in the gastric lumen while the side port remains at the GE junction. Gaseous distention of multiple upper abdominal bowel loops is seen. Atelectatic changes are present in the lung bases. The aorta is calcified. The remaining cardiomediastinal contours are otherwise unremarkable for limited portable technique. Cholecystectomy clips are present in the right upper quadrant. Dense bridging syndesmophytes of the imaged spine compatible with ankylosing spondylitis. IMPRESSION: 1. Transesophageal tube tip is in the gastric lumen while the side port remains at the GE junction. Recommend advancement at least 5 cm to position the side port well within the gastric lumen for optimal function. 2. Gaseous distention of multiple upper abdominal bowel loops compatible with early or developing obstruction seen on CT. 3. Bibasilar atelectasis. 4. Features of ankylosing spondylitis. 5.  Aortic Atherosclerosis (ICD10-I70.0). Electronically Signed   By: Lovena Le M.D.   On: 03/09/2019 06:42   Ct Abdomen Pelvis W Contrast  Result Date: 03/09/2019 CLINICAL DATA:  Lower abdominal pain with nausea and vomiting EXAM: CT ABDOMEN AND PELVIS WITH CONTRAST TECHNIQUE: Multidetector CT imaging of the abdomen and pelvis was performed using the standard protocol following bolus administration of intravenous contrast. CONTRAST:  172mL OMNIPAQUE IOHEXOL 300 MG/ML  SOLN COMPARISON:  None. FINDINGS: LOWER CHEST: No basilar pleural or apical pericardial effusion. HEPATOBILIARY: Normal hepatic contours. There is no intra- or extrahepatic biliary dilatation. Status post cholecystectomy. PANCREAS: Normal pancreatic contours without pancreatic ductal dilatation or peripancreatic fluid collection. SPLEEN: Normal. ADRENALS/URINARY TRACT: --Adrenal glands: Normal. --Right kidney/ureter: Multiple calcifications, measuring up to 5 mm. No hydronephrosis, perinephric stranding or solid renal  mass. --Left kidney/ureter: Multiple calcifications, measuring up to 3 mm. No hydronephrosis, perinephric stranding or solid renal mass. --Urinary bladder: Normal for degree of distention STOMACH/BOWEL: No hiatal hernia. Normal duodenal course. There are 2 anastomoses in the left lower quadrant, unchanged. There is mildly dilated fluid-filled bowel predominantly in the left lower quadrant. VASCULAR/LYMPHATIC: There is calcific atherosclerosis of the abdominal aorta. No abdominal or pelvic lymphadenopathy. REPRODUCTIVE: Normal prostate size with symmetric seminal vesicles. MUSCULOSKELETAL. Right total hip arthroplasty. There is fusion at both sacroiliac joints. There is diffuse thoracolumbar ankylosis. OTHER: None. IMPRESSION: 1. Multiple mildly dilated loops of small bowel with air-fluid levels, greatest at the site of the anterior left lower quadrant anastomosis. This could indicate developing obstruction. 2. Ankylosing spondylitis. 3.  Aortic atherosclerosis (ICD10-I70.0). Electronically Signed   By: Ulyses Jarred M.D.   On: 03/09/2019 05:04   Dg Finger Little Right  Result Date: 02/21/2019 CLINICAL DATA:  Cut finger with table saw, laceration. Evaluate for fracture. EXAM: RIGHT LITTLE FINGER 2+V COMPARISON:  None. FINDINGS: There is no evidence of fracture or dislocation. Moderate osteoarthritis of the metacarpal phalangeal joint, mild osteoarthritis of the distal interphalangeal joint. No radiopaque foreign body. Site of laceration tentatively visualized projecting over the middle phalanx. IMPRESSION: 1. No fracture, dislocation, or radiopaque foreign body. 2. Osteoarthritis. Electronically Signed   By: Keith Rake M.D.   On: 02/21/2019 19:05     Antimicrobials:   None   Subjective: Admitted overnight for small bowel obstruction recurrent. Resting comfortably no acute pain no change in clinical status Still  without flatus or BM  Objective: Vitals:   03/09/19 0942 03/09/19 1157 03/09/19  1930 03/10/19 0429  BP: (!) 160/76 (!) 127/47 123/66 (!) 101/59  Pulse: 73 72 69 63  Resp: 16 16 20 20   Temp: 99.3 F (37.4 C) 98 F (36.7 C) 98.2 F (36.8 C) 98.7 F (37.1 C)  TempSrc: Oral Oral Oral Oral  SpO2: 97% 100% 98% 95%  Weight:      Height:        Intake/Output Summary (Last 24 hours) at 03/10/2019 1027 Last data filed at 03/10/2019 0929 Gross per 24 hour  Intake 1271.43 ml  Output 2200 ml  Net -928.57 ml   Filed Weights   03/09/19 0151  Weight: 66.7 kg    Examination:  General exam: Appears calm and comfortable  Respiratory system: Clear to auscultation. Respiratory effort normal. Cardiovascular system: S1 & S2 heard, RRR. No JVD, murmurs, rubs, gallops or clicks. No pedal edema. Gastrointestinal system: Abdomen is nondistended, soft mildly tender to palpation nonfocal, quiet bowel sounds Central nervous system: Alert and oriented. No focal neurological deficits. Extremities: Warm well perfused, no edema. Skin: No rashes, lesions or ulcers Psychiatry: Judgement and insight appear normal. Mood & affect appropriate.     Data Reviewed: I have personally reviewed following labs and imaging studies  CBC: Recent Labs  Lab 03/09/19 0226 03/10/19 0421  WBC 14.3* 5.7  NEUTROABS 12.4*  --   HGB 11.1* 9.4*  HCT 35.8* 29.9*  MCV 88.8 87.9  PLT 374 [id number]   Basic Metabolic Panel: Recent Labs  Lab 03/09/19 0226 03/10/19 0421  NA 140 137  K 3.6 3.5  CL 100 101  CO2 26 26  GLUCOSE 197* 133*  BUN 21 12  CREATININE 0.87 0.83  CALCIUM 9.6 8.3*   GFR: Estimated Creatinine Clearance: 61.9 mL/min (by C-G formula based on SCr of 0.83 mg/dL). Liver Function Tests: Recent Labs  Lab 03/09/19 0226 03/10/19 0421  AST 23 20  ALT 19 13  ALKPHOS 44 33*  BILITOT 0.9 1.2  PROT 8.0 6.3*  ALBUMIN 4.6 3.5   Recent Labs  Lab 03/09/19 0226  LIPASE 50   No results for input(s): AMMONIA in the last 168 hours. Coagulation Profile: Recent Labs  Lab  03/10/19 0421  INR 1.1   Cardiac Enzymes: No results for input(s): CKTOTAL, CKMB, CKMBINDEX, TROPONINI in the last 168 hours. BNP (last 3 results) No results for input(s): PROBNP in the last 8760 hours. HbA1C: Recent Labs    03/09/19 1000  HGBA1C 6.1*   CBG: Recent Labs  Lab 03/09/19 1157 03/09/19 1636 03/10/19 0113 03/10/19 0536  GLUCAP 119* 107* 120* 126*   Lipid Profile: No results for input(s): CHOL, HDL, LDLCALC, TRIG, CHOLHDL, LDLDIRECT in the last 72 hours. Thyroid Function Tests: No results for input(s): TSH, T4TOTAL, FREET4, T3FREE, THYROIDAB in the last 72 hours. Anemia Panel: No results for input(s): VITAMINB12, FOLATE, FERRITIN, TIBC, IRON, RETICCTPCT in the last 72 hours. Sepsis Labs: No results for input(s): PROCALCITON, LATICACIDVEN in the last 168 hours.  Recent Results (from the past 240 hour(s))  SARS CORONAVIRUS 2 (TAT 6-24 HRS) Nasopharyngeal Nasopharyngeal Swab     Status: None   Collection Time: 03/09/19  6:10 AM   Specimen: Nasopharyngeal Swab  Result Value Ref Range Status   SARS Coronavirus 2 NEGATIVE NEGATIVE Final    Comment: (NOTE) SARS-CoV-2 target nucleic acids are NOT DETECTED. The SARS-CoV-2 RNA is generally detectable in upper and lower respiratory specimens during the acute phase  of infection. Negative results do not preclude SARS-CoV-2 infection, do not rule out co-infections with other pathogens, and should not be used as the sole basis for treatment or other patient management decisions. Negative results must be combined with clinical observations, patient history, and epidemiological information. The expected result is Negative. Fact Sheet for Patients: SugarRoll.be Fact Sheet for Healthcare Providers: https://www.woods-mathews.com/ This test is not yet approved or cleared by the [place] FDA and  has been authorized for detection and/or diagnosis of SARS-CoV-2 by FDA under an  Emergency Use Authorization (EUA). This EUA will remain  in effect (meaning this test can be used) for the duration of the COVID-19 declaration under Section 56 4(b)(1) of the Act, 21 U.S.C. section 360bbb-3(b)(1), unless the authorization is terminated or revoked sooner. Performed at [facility], [place] [address], [place], [place] [address]          Radiology Studies: Dg Abd 1 View  Result Date: 03/10/2019 CLINICAL DATA:  Small bowel obstruction EXAM: ABDOMEN - 1 VIEW COMPARISON:  CT abdomen and pelvis March 09, 2019; abdominal radiograph March 09, 2019 FINDINGS: Nasogastric tube tip and side port are in the body of the stomach. There are loops of mildly dilated bowel. No air-fluid levels. Air is noted in the rectum. No free air. There are surgical clips in the right upper quadrant. Patient is status post total hip replacement on the right. Lung bases are clear. IMPRESSION: Nasogastric tube tip and side port in stomach. There remain loops of mildly dilated bowel, suggesting partially resolving bowel obstruction. No free air evident. Visualized lung bases clear. Electronically Signed   By: Lowella Grip III M.D.   On: 03/10/2019 07:04   Dg Abdomen 1 View  Result Date: 03/09/2019 CLINICAL DATA:  NG tube placement EXAM: ABDOMEN - 1 VIEW COMPARISON:  Radiograph 03/25/2015, CT abdomen pelvis 03/09/2019 FINDINGS: Transesophageal tube tip is in the gastric lumen while the side port remains at the GE junction. Gaseous distention of multiple upper abdominal bowel loops is seen. Atelectatic changes are present in the lung bases. The aorta is calcified. The remaining cardiomediastinal contours are otherwise unremarkable for limited portable technique. Cholecystectomy clips are present in the right upper quadrant. Dense bridging syndesmophytes of the imaged spine compatible with ankylosing spondylitis. IMPRESSION: 1. Transesophageal tube tip is in the gastric lumen while the side port  remains at the GE junction. Recommend advancement at least 5 cm to position the side port well within the gastric lumen for optimal function. 2. Gaseous distention of multiple upper abdominal bowel loops compatible with early or developing obstruction seen on CT. 3. Bibasilar atelectasis. 4. Features of ankylosing spondylitis. 5.  Aortic Atherosclerosis (ICD10-I70.0). Electronically Signed   By: Lovena Le M.D.   On: 03/09/2019 06:42   Ct Abdomen Pelvis W Contrast  Result Date: 03/09/2019 CLINICAL DATA:  Lower abdominal pain with nausea and vomiting EXAM: CT ABDOMEN AND PELVIS WITH CONTRAST TECHNIQUE: Multidetector CT imaging of the abdomen and pelvis was performed using the standard protocol following bolus administration of intravenous contrast. CONTRAST:  133mL OMNIPAQUE IOHEXOL 300 MG/ML  SOLN COMPARISON:  None. FINDINGS: LOWER CHEST: No basilar pleural or apical pericardial effusion. HEPATOBILIARY: Normal hepatic contours. There is no intra- or extrahepatic biliary dilatation. Status post cholecystectomy. PANCREAS: Normal pancreatic contours without pancreatic ductal dilatation or peripancreatic fluid collection. SPLEEN: Normal. ADRENALS/URINARY TRACT: --Adrenal glands: Normal. --Right kidney/ureter: Multiple calcifications, measuring up to 5 mm. No hydronephrosis, perinephric stranding or solid renal mass. --Left kidney/ureter: Multiple  calcifications, measuring up to 3 mm. No hydronephrosis, perinephric stranding or solid renal mass. --Urinary bladder: Normal for degree of distention STOMACH/BOWEL: No hiatal hernia. Normal duodenal course. There are 2 anastomoses in the left lower quadrant, unchanged. There is mildly dilated fluid-filled bowel predominantly in the left lower quadrant. VASCULAR/LYMPHATIC: There is calcific atherosclerosis of the abdominal aorta. No abdominal or pelvic lymphadenopathy. REPRODUCTIVE: Normal prostate size with symmetric seminal vesicles. MUSCULOSKELETAL. Right total hip  arthroplasty. There is fusion at both sacroiliac joints. There is diffuse thoracolumbar ankylosis. OTHER: None. IMPRESSION: 1. Multiple mildly dilated loops of small bowel with air-fluid levels, greatest at the site of the anterior left lower quadrant anastomosis. This could indicate developing obstruction. 2. Ankylosing spondylitis. 3.  Aortic atherosclerosis (ICD10-I70.0). Electronically Signed   By: Ulyses Jarred M.D.   On: 03/09/2019 05:04        Scheduled Meds: . heparin  5,000 Units Subcutaneous Q8H  . insulin aspart  0-6 Units Subcutaneous Q6H  . simvastatin  10 mg Oral QHS   Continuous Infusions: . sodium chloride Stopped (03/09/19 1141)  . dextrose 5 % and 0.45% NaCl       LOS: 1 day    Time spent: 35 min    Nicolette Bang, MD [facility]  If 7PM-7AM, please contact night-coverage  03/10/2019, 10:27 AM

## 2019-03-10 NOTE — Plan of Care (Signed)
Patient doing well today.  No complaints of pain.  Abdomen is still distended but positive bowel sounds, no passing flatus yet.  Administered the gastrogafin.  Small amount of output from the NG tube.  Patient has ambulated multiple laps around the nurses station.  No significant changes.

## 2019-03-11 LAB — GLUCOSE, CAPILLARY
Glucose-Capillary: 115 mg/dL — ABNORMAL HIGH (ref 70–99)
Glucose-Capillary: 127 mg/dL — ABNORMAL HIGH (ref 70–99)
Glucose-Capillary: 131 mg/dL — ABNORMAL HIGH (ref 70–99)
Glucose-Capillary: 148 mg/dL — ABNORMAL HIGH (ref 70–99)

## 2019-03-11 MED ORDER — LORATADINE 10 MG PO TABS
10.0000 mg | ORAL_TABLET | Freq: Every day | ORAL | Status: DC | PRN
  Administered 2019-03-11: 10 mg via ORAL
  Filled 2019-03-11: qty 1

## 2019-03-11 NOTE — Progress Notes (Signed)
PROGRESS NOTE    Steven [name]  [id number] DOB: [birth year] DOA: 03/09/2019 PCP: Steven Pretty, MD   Brief Narrative:  Per admitting physician: Steven Sample [name] a 84 y.o.malewith medical history significant ohyperlipidemia, depression, ankylosing spondylitis, cholecystectomy/appendectomy/colectomy/reversal of colostomycoming to the hospital with complains of abdominal pain, nausea. Patient states back in the 90s he had abscess in his colon for which he had colectomy and then later reversal of his colostomy, later on cholecystectomy and appendectomy. Since then he has suffered from at least 3-4 episodes of small bowel obstructions, last one being about 2 years ago. This time symptoms started about 2 days ago and has been having some nausea and progressive worsening of his abdominal discomfort. Unable to pass gas during this time.  In the ER his vital signs are stable, his labs are overall unremarkable. CT of the abdomen pelvis that showed multiple dilated small bowel loops concerning for developing obstruction. NG tube was placed with significant amount of output. General surgery was consulted.   Assessment & Plan:   Principal Problem:   Bowel obstruction (HCC) Active Problems:   Abdominal pain   Hyperlipidemia   History of ankylosing spondylitis   SBO (small bowel obstruction) (HCC)   Anemia   Small bowel obstruction (HCC)   Early small bowel obstruction History of appendectomy, cholecystectomy and colectomy -Reported bowel movements overnight -NG tube removed, advancing diet to clear liquids -Supportive care, monitor electrolytes. -General surgery consulted, appreciate their input  -Serial abdominal imaging -Gentle hydration D5 half-normal saline  Hyperlipidemia -On statin at home. -Continue on discharge  Depression -On Celexa. On hold. -Continue on discharge  History of ankylosing spondylitis -Hold methotrexate on hold. -Resume on discharge  DVT  prophylaxis: Heparin SQ  Code Status: Full code    Code Status Orders  (From admission, onward)         Start     Ordered   03/09/19 0738  Full code  Continuous     03/09/19 0739        Code Status History    Date Active Date Inactive Code Status Order ID Comments User Context   11/06/2016 1539 11/08/2016 2018 Full Code [id number]  [facility] In[facility]   03/21/2015 1052 03/27/2015 1554 Full Code [id number]  [facility]   04/10/2014 0647 04/12/2014 1747 Full Code [id number]  Rise Patience, MD Inpatient   Advance Care Planning Activity     Family Communication: Discussed in detail with patient Disposition Plan:   Patient remained inpatient for continued IV fluids, advancement of diet, continued expert subspecialty consultation.  Patient not yet stable or ready for discharge Consults called: None Admission status: Inpatient   Consultants:   General surgery  Procedures:  Dg Abd 1 View  Result Date: 03/10/2019 CLINICAL DATA:  Small bowel obstruction EXAM: ABDOMEN - 1 VIEW COMPARISON:  CT abdomen and pelvis March 09, 2019; abdominal radiograph March 09, 2019 FINDINGS: Nasogastric tube tip and side port are in the body of the stomach. There are loops of mildly dilated bowel. No air-fluid levels. Air is noted in the rectum. No free air. There are surgical clips in the right upper quadrant. Patient is status post total hip replacement on the right. Lung bases are clear. IMPRESSION: Nasogastric tube tip and side port in stomach. There remain loops of mildly dilated bowel, suggesting partially resolving bowel obstruction. No free air evident. Visualized lung bases clear. Electronically Signed   By: Lowella Grip III M.D.   On:  03/10/2019 07:04   Dg Abdomen 1 View  Result Date: 03/09/2019 CLINICAL DATA:  NG tube placement EXAM: ABDOMEN - 1 VIEW COMPARISON:  Radiograph 03/25/2015, CT abdomen pelvis 03/09/2019 FINDINGS: Transesophageal tube tip is in the  gastric lumen while the side port remains at the GE junction. Gaseous distention of multiple upper abdominal bowel loops is seen. Atelectatic changes are present in the lung bases. The aorta is calcified. The remaining cardiomediastinal contours are otherwise unremarkable for limited portable technique. Cholecystectomy clips are present in the right upper quadrant. Dense bridging syndesmophytes of the imaged spine compatible with ankylosing spondylitis. IMPRESSION: 1. Transesophageal tube tip is in the gastric lumen while the side port remains at the GE junction. Recommend advancement at least 5 cm to position the side port well within the gastric lumen for optimal function. 2. Gaseous distention of multiple upper abdominal bowel loops compatible with early or developing obstruction seen on CT. 3. Bibasilar atelectasis. 4. Features of ankylosing spondylitis. 5.  Aortic Atherosclerosis (ICD10-I70.0). Electronically Signed   By: Lovena Le M.D.   On: 03/09/2019 06:42   Ct Abdomen Pelvis W Contrast  Result Date: 03/09/2019 CLINICAL DATA:  Lower abdominal pain with nausea and vomiting EXAM: CT ABDOMEN AND PELVIS WITH CONTRAST TECHNIQUE: Multidetector CT imaging of the abdomen and pelvis was performed using the standard protocol following bolus administration of intravenous contrast. CONTRAST:  18mL OMNIPAQUE IOHEXOL 300 MG/ML  SOLN COMPARISON:  None. FINDINGS: LOWER CHEST: No basilar pleural or apical pericardial effusion. HEPATOBILIARY: Normal hepatic contours. There is no intra- or extrahepatic biliary dilatation. Status post cholecystectomy. PANCREAS: Normal pancreatic contours without pancreatic ductal dilatation or peripancreatic fluid collection. SPLEEN: Normal. ADRENALS/URINARY TRACT: --Adrenal glands: Normal. --Right kidney/ureter: Multiple calcifications, measuring up to 5 mm. No hydronephrosis, perinephric stranding or solid renal mass. --Left kidney/ureter: Multiple calcifications, measuring up to 3  mm. No hydronephrosis, perinephric stranding or solid renal mass. --Urinary bladder: Normal for degree of distention STOMACH/BOWEL: No hiatal hernia. Normal duodenal course. There are 2 anastomoses in the left lower quadrant, unchanged. There is mildly dilated fluid-filled bowel predominantly in the left lower quadrant. VASCULAR/LYMPHATIC: There is calcific atherosclerosis of the abdominal aorta. No abdominal or pelvic lymphadenopathy. REPRODUCTIVE: Normal prostate size with symmetric seminal vesicles. MUSCULOSKELETAL. Right total hip arthroplasty. There is fusion at both sacroiliac joints. There is diffuse thoracolumbar ankylosis. OTHER: None. IMPRESSION: 1. Multiple mildly dilated loops of small bowel with air-fluid levels, greatest at the site of the anterior left lower quadrant anastomosis. This could indicate developing obstruction. 2. Ankylosing spondylitis. 3.  Aortic atherosclerosis (ICD10-I70.0). Electronically Signed   By: Ulyses Jarred M.D.   On: 03/09/2019 05:04   Dg Abd Portable 1v-small Bowel Obstruction Protocol-initial, 8 Hr Delay  Result Date: 03/10/2019 CLINICAL DATA:  Small bowel obstruction EXAM: PORTABLE ABDOMEN - 1 VIEW COMPARISON:  03/10/2019.  CT 03/09/2019 FINDINGS: NG tube is in the stomach. Oral contrast material noted throughout the colon. Small bowel distention has decreased since prior study. IMPRESSION: Passage of oral contrast material into the colon. Decreasing gaseous distention of small bowel. Findings compatible with improving small bowel obstruction. Electronically Signed   By: Rolm Baptise M.D.   On: 03/10/2019 20:47   Dg Finger Little Right  Result Date: 02/21/2019 CLINICAL DATA:  Cut finger with table saw, laceration. Evaluate for fracture. EXAM: RIGHT LITTLE FINGER 2+V COMPARISON:  None. FINDINGS: There is no evidence of fracture or dislocation. Moderate osteoarthritis of the metacarpal phalangeal joint, mild osteoarthritis of the distal interphalangeal joint. No  radiopaque foreign body. Site of laceration tentatively visualized projecting over the middle phalanx. IMPRESSION: 1. No fracture, dislocation, or radiopaque foreign body. 2. Osteoarthritis. Electronically Signed   By: Keith Rake M.D.   On: 02/21/2019 19:05     Antimicrobials:   None   Subjective: Patient reported bowel movements overnight, No abdominal pain Advancing diet  Objective: Vitals:   03/10/19 0429 03/10/19 1302 03/10/19 2002 03/11/19 0439  BP: (!) 101/59 (!) 112/56 (!) 121/58 117/60  Pulse: 63 60 62 62  Resp: 20 18 18 20   Temp: 98.7 F (37.1 C) 98.4 F (36.9 C) 98.7 F (37.1 C) 98.4 F (36.9 C)  TempSrc: Oral Oral Oral Oral  SpO2: 95% 100% 100% 96%  Weight:    62.2 kg  Height:        Intake/Output Summary (Last 24 hours) at 03/11/2019 1133 Last data filed at 03/11/2019 0539 Gross per 24 hour  Intake 1073.58 ml  Output 750 ml  Net 323.58 ml   Filed Weights   03/09/19 0151 03/11/19 0439  Weight: 66.7 kg 62.2 kg    Examination:  General exam: Appears calm and comfortable  Respiratory system: Clear to auscultation. Respiratory effort normal. Cardiovascular system: S1 & S2 heard, RRR. No JVD, murmurs, rubs, gallops or clicks. No pedal edema. Gastrointestinal system: Abdomen is nondistended, soft and nontender.  Positive bowel sounds Central nervous system: Alert and oriented. No focal neurological deficits. Extremities: Warm well perfused no edema Skin: No rashes, lesions or ulcers Psychiatry: Judgement and insight appear normal. Mood & affect appropriate.     Data Reviewed: I have personally reviewed following labs and imaging studies  CBC: Recent Labs  Lab 03/09/19 0226 03/10/19 0421  WBC 14.3* 5.7  NEUTROABS 12.4*  --   HGB 11.1* 9.4*  HCT 35.8* 29.9*  MCV 88.8 87.9  PLT 374 [id number]   Basic Metabolic Panel: Recent Labs  Lab 03/09/19 0226 03/10/19 0421  NA 140 137  K 3.6 3.5  CL 100 101  CO2 26 26  GLUCOSE 197* 133*  BUN 21 12   CREATININE 0.87 0.83  CALCIUM 9.6 8.3*   GFR: Estimated Creatinine Clearance: 60.4 mL/min (by C-G formula based on SCr of 0.83 mg/dL). Liver Function Tests: Recent Labs  Lab 03/09/19 0226 03/10/19 0421  AST 23 20  ALT 19 13  ALKPHOS 44 33*  BILITOT 0.9 1.2  PROT 8.0 6.3*  ALBUMIN 4.6 3.5   Recent Labs  Lab 03/09/19 0226  LIPASE 50   No results for input(s): AMMONIA in the last 168 hours. Coagulation Profile: Recent Labs  Lab 03/10/19 0421  INR 1.1   Cardiac Enzymes: No results for input(s): CKTOTAL, CKMB, CKMBINDEX, TROPONINI in the last 168 hours. BNP (last 3 results) No results for input(s): PROBNP in the last 8760 hours. HbA1C: Recent Labs    03/09/19 1000  HGBA1C 6.1*   CBG: Recent Labs  Lab 03/10/19 0536 03/10/19 1213 03/10/19 1721 03/11/19 0003 03/11/19 0558  GLUCAP 126* 111* 124* 131* 115*   Lipid Profile: No results for input(s): CHOL, HDL, LDLCALC, TRIG, CHOLHDL, LDLDIRECT in the last 72 hours. Thyroid Function Tests: No results for input(s): TSH, T4TOTAL, FREET4, T3FREE, THYROIDAB in the last 72 hours. Anemia Panel: No results for input(s): VITAMINB12, FOLATE, FERRITIN, TIBC, IRON, RETICCTPCT in the last 72 hours. Sepsis Labs: No results for input(s): PROCALCITON, LATICACIDVEN in the last 168 hours.  Recent Results (from the past 240 hour(s))  SARS CORONAVIRUS 2 (TAT 6-24 HRS) Nasopharyngeal Nasopharyngeal Swab  Status: None   Collection Time: 03/09/19  6:10 AM   Specimen: Nasopharyngeal Swab  Result Value Ref Range Status   SARS Coronavirus 2 NEGATIVE NEGATIVE Final    Comment: (NOTE) SARS-CoV-2 target nucleic acids are NOT DETECTED. The SARS-CoV-2 RNA is generally detectable in upper and lower respiratory specimens during the acute phase of infection. Negative results do not preclude SARS-CoV-2 infection, do not rule out co-infections with other pathogens, and should not be used as the sole basis for treatment or other patient  management decisions. Negative results must be combined with clinical observations, patient history, and epidemiological information. The expected result is Negative. Fact Sheet for Patients: SugarRoll.be Fact Sheet for Healthcare Providers: https://www.woods-mathews.com/ This test is not yet approved or cleared by the [place] FDA and  has been authorized for detection and/or diagnosis of SARS-CoV-2 by FDA under an Emergency Use Authorization (EUA). This EUA will remain  in effect (meaning this test can be used) for the duration of the COVID-19 declaration under Section 56 4(b)(1) of the Act, 21 U.S.C. section 360bbb-3(b)(1), unless the authorization is terminated or revoked sooner. Performed at [facility], [place] [address], [place],  [address]          Radiology Studies: Dg Abd 1 View  Result Date: 03/10/2019 CLINICAL DATA:  Small bowel obstruction EXAM: ABDOMEN - 1 VIEW COMPARISON:  CT abdomen and pelvis March 09, 2019; abdominal radiograph March 09, 2019 FINDINGS: Nasogastric tube tip and side port are in the body of the stomach. There are loops of mildly dilated bowel. No air-fluid levels. Air is noted in the rectum. No free air. There are surgical clips in the right upper quadrant. Patient is status post total hip replacement on the right. Lung bases are clear. IMPRESSION: Nasogastric tube tip and side port in stomach. There remain loops of mildly dilated bowel, suggesting partially resolving bowel obstruction. No free air evident. Visualized lung bases clear. Electronically Signed   By: Lowella Grip III M.D.   On: 03/10/2019 07:04   Dg Abd Portable 1v-small Bowel Obstruction Protocol-initial, 8 Hr Delay  Result Date: 03/10/2019 CLINICAL DATA:  Small bowel obstruction EXAM: PORTABLE ABDOMEN - 1 VIEW COMPARISON:  03/10/2019.  CT 03/09/2019 FINDINGS: NG tube is in the stomach. Oral contrast material noted  throughout the colon. Small bowel distention has decreased since prior study. IMPRESSION: Passage of oral contrast material into the colon. Decreasing gaseous distention of small bowel. Findings compatible with improving small bowel obstruction. Electronically Signed   By: Rolm Baptise M.D.   On: 03/10/2019 20:47        Scheduled Meds: . heparin  5,000 Units Subcutaneous Q8H  . insulin aspart  0-6 Units Subcutaneous Q6H  . simvastatin  10 mg Oral QHS   Continuous Infusions: . sodium chloride Stopped (03/09/19 1141)     LOS: 2 days    Time spent: 27 min    Nicolette Bang, MD [facility]  If 7PM-7AM, please contact night-coverage  03/11/2019, 11:33 AM

## 2019-03-11 NOTE — Progress Notes (Signed)
Subjective:  CC: Steven [name] is a 84 y.o. male  Hospital stay day 2,   SBO  HPI: Multiple bowel movements recorded overnight.  Patient states he is feeling better as well.  ROS:  General: Denies weight loss, weight gain, fatigue, fevers, chills, and night sweats. Heart: Denies chest pain, palpitations, racing heart, irregular heartbeat, leg pain or swelling, and decreased activity tolerance. Respiratory: Denies breathing difficulty, shortness of breath, wheezing, cough, and sputum. GI: Denies change in appetite, heartburn, nausea, vomiting, constipation, diarrhea, and blood in stool. GU: Denies difficulty urinating, pain with urinating, urgency, frequency, blood in urine.   Objective:   Temp:  [98.4 F (36.9 C)-98.7 F (37.1 C)] 98.4 F (36.9 C) (11/28 0439) Pulse Rate:  [60-62] 62 (11/28 0439) Resp:  [18-20] 20 (11/28 0439) BP: (112-121)/(56-60) 117/60 (11/28 0439) SpO2:  [96 %-100 %] 96 % (11/28 0439) Weight:  [62.2 kg] 62.2 kg (11/28 0439)     Height: 5\' 6"  (167.6 cm) Weight: 62.2 kg BMI (Calculated): 22.14   Intake/Output this shift:   Intake/Output Summary (Last 24 hours) at 03/11/2019 1038 Last data filed at 03/11/2019 0539 Gross per 24 hour  Intake 1073.58 ml  Output 750 ml  Net 323.58 ml    Constitutional :  alert, cooperative, appears stated age and no distress  Respiratory:  clear to auscultation bilaterally  Cardiovascular:  regular rate and rhythm  Gastrointestinal: Soft, no guarding, improved tenderness to palpation in right lower quadrant.  Resolved distention on clinical exam..   Skin: Cool and moist.   Psychiatric: Normal affect, non-agitated, not confused       LABS:  CMP Latest Ref Rng & Units 03/10/2019 03/09/2019 12/07/2017  Glucose 70 - 99 mg/dL 133(H) 197(H) 86  BUN 8 - 23 mg/dL 12 21 18   Creatinine 0.61 - 1.24 mg/dL 0.83 0.87 0.82  Sodium 135 - 145 mmol/L 137 140 141  Potassium 3.5 - 5.1 mmol/L 3.5 3.6 4.4  Chloride 98 - 111 mmol/L 101 100  103  CO2 22 - 32 mmol/L 26 26 31   Calcium 8.9 - 10.3 mg/dL 8.3(L) 9.6 9.7  Total Protein 6.5 - 8.1 g/dL 6.3(L) 8.0 7.3  Total Bilirubin 0.3 - 1.2 mg/dL 1.2 0.9 0.4  Alkaline Phos 38 - 126 U/L 33(L) 44 73  AST 15 - 41 U/L 20 23 25   ALT 0 - 44 U/L 13 19 20    CBC Latest Ref Rng & Units 03/10/2019 03/09/2019 12/07/2017  WBC 4.0 - 10.5 K/uL 5.7 14.3(H) 8.0  Hemoglobin 13.0 - 17.0 g/dL 9.4(L) 11.1(L) 11.9(L)  Hematocrit 39.0 - 52.0 % 29.9(L) 35.8(L) 36.6(L)  Platelets 150 - 400 K/uL 294 374 352    RADS: CLINICAL DATA:  Small bowel obstruction  EXAM: PORTABLE ABDOMEN - 1 VIEW  COMPARISON:  03/10/2019.  CT 03/09/2019  FINDINGS: NG tube is in the stomach. Oral contrast material noted throughout the colon. Small bowel distention has decreased since prior study.  IMPRESSION: Passage of oral contrast material into the colon. Decreasing gaseous distention of small bowel. Findings compatible with improving small bowel obstruction.   Electronically Signed   By: Rolm Baptise M.D.   On: 03/10/2019 20:47 Assessment:   Small bowel obstruction.  Resolving.  NG tube discontinued and will start on clear liquid diet and advance as tolerated.

## 2019-03-12 LAB — GLUCOSE, CAPILLARY
Glucose-Capillary: 91 mg/dL (ref 70–99)
Glucose-Capillary: 96 mg/dL (ref 70–99)
Glucose-Capillary: 96 mg/dL (ref 70–99)

## 2019-03-12 NOTE — Discharge Summary (Signed)
Physician Discharge Steven [name] DOB: 06-12-1934 DOA: 03/09/2019  PCP: [name], MD  Admit date: 03/09/2019 Discharge date: 03/12/2019  Admitted From: Inpatient Disposition: home  Recommendations for Outpatient Follow-up:  1. Follow up with PCP in 1-2 weeks   Home Health:No Equipment/Devices:NONE  Discharge Condition:Stable CODE STATUS:Full code Diet recommendation: Regular healthy diet  Brief/Interim Summary: Per admitting physician: Steven Orbach [name] a 84 y.o.malewith medical history significant ohyperlipidemia, depression, ankylosing spondylitis, cholecystectomy/appendectomy/colectomy/reversal of colostomycoming to the hospital with complains of abdominal pain, nausea. Patient states back in the 90s he had abscess in his colon for which he had colectomy and then later reversal of his colostomy, later on cholecystectomy and appendectomy. Since then he has suffered from at least 3-4 episodes of small bowel obstructions, last one being about 2 years ago. This time symptoms started about 2 days ago and has been having some nausea and progressive worsening of his abdominal discomfort. Unable to pass gas during this time.  In the ER his vital signs are stable, his labs are overall unremarkable. CT of the abdomen pelvis that showed multiple dilated small bowel loops concerning for developing obstruction. NG tube was placed with significant amount of output. General surgery was consulted  Hospital course Early small bowel obstruction History of appendectomy, cholecystectomy and colectomy -Reported bowel movements without further complication -NG tube removed, advancing diet if he tolerates able to go home -General surgery consulted, appreciate their input  -Treated with gentle hydration D5 half-normal saline  Hyperlipidemia -On statin at home. -Continue on discharge  Depression -On Celexa. On hold. -Continue on discharge  History of  ankylosing spondylitis -Hold methotrexate -Resume on discharge    Discharge Diagnoses:  Principal Problem:   Bowel obstruction (HCC) Active Problems:   Abdominal pain   Hyperlipidemia   History of ankylosing spondylitis   SBO (small bowel obstruction) (HCC)   Anemia   Small bowel obstruction [facility])    Discharge Instructions  Discharge Instructions    Call MD for:  difficulty breathing, headache or visual disturbances   Complete by: As directed    Call MD for:  extreme fatigue   Complete by: As directed    Call MD for:  hives   Complete by: As directed    Call MD for:  persistant dizziness or light-headedness   Complete by: As directed    Call MD for:  persistant nausea and vomiting   Complete by: As directed    Call MD for:  severe uncontrolled pain   Complete by: As directed    Call MD for:  temperature >100.4   Complete by: As directed    Diet - low sodium heart healthy   Complete by: As directed    Increase activity slowly   Complete by: As directed      Allergies as of 03/12/2019      Reactions   Indomethacin Hives   Lactose Intolerance (gi)    GI UPSET      Medication List    TAKE these medications   Biotin 5 MG Caps Take 1 capsule by mouth daily.   Calcium Carbonate-Vitamin D 600-400 MG-UNIT tablet Take 1 tablet by mouth 2 (two) times daily.   cholecalciferol 25 MCG (1000 UT) tablet Commonly known as: VITAMIN D Take 1,000 Units by mouth daily.   citalopram 40 MG tablet Commonly known as: CELEXA Take 40 mg by mouth daily.   folic acid 1 MG tablet Commonly known as: FOLVITE Take 1 mg by mouth  daily.   Fusion Plus Caps Take 1 tablet by mouth daily.   methotrexate 2.5 MG tablet Commonly known as: RHEUMATREX Take 25 mg by mouth once a week. Sunday   prednisoLONE acetate 1 % ophthalmic suspension Commonly known as: PRED FORTE Place 1 drop into the right eye 4 (four) times daily.   PRESERVISION AREDS 2 PO Take 1 tablet by mouth 2 (two)  times daily.   PROBIOTIC PO Take 1 capsule by mouth daily.   simvastatin 5 MG tablet Commonly known as: ZOCOR Take 10 mg by mouth at bedtime.   Soothe XP Soln Place 1 drop into both eyes 2 (two) times daily.       Allergies  Allergen Reactions  . Indomethacin Hives  . Lactose Intolerance (Gi)     GI UPSET    Consultations:  GEN SURG   Procedures/Studies: Dg Abd 1 View  Result Date: 03/10/2019 CLINICAL DATA:  Small bowel obstruction EXAM: ABDOMEN - 1 VIEW COMPARISON:  CT abdomen and pelvis March 09, 2019; abdominal radiograph March 09, 2019 FINDINGS: Nasogastric tube tip and side port are in the body of the stomach. There are loops of mildly dilated bowel. No air-fluid levels. Air is noted in the rectum. No free air. There are surgical clips in the right upper quadrant. Patient is status post total hip replacement on the right. Lung bases are clear. IMPRESSION: Nasogastric tube tip and side port in stomach. There remain loops of mildly dilated bowel, suggesting partially resolving bowel obstruction. No free air evident. Visualized lung bases clear. Electronically Signed   By: Lowella Grip III M.D.   On: 03/10/2019 07:04   Dg Abdomen 1 View  Result Date: 03/09/2019 CLINICAL DATA:  NG tube placement EXAM: ABDOMEN - 1 VIEW COMPARISON:  Radiograph 03/25/2015, CT abdomen pelvis 03/09/2019 FINDINGS: Transesophageal tube tip is in the gastric lumen while the side port remains at the GE junction. Gaseous distention of multiple upper abdominal bowel loops is seen. Atelectatic changes are present in the lung bases. The aorta is calcified. The remaining cardiomediastinal contours are otherwise unremarkable for limited portable technique. Cholecystectomy clips are present in the right upper quadrant. Dense bridging syndesmophytes of the imaged spine compatible with ankylosing spondylitis. IMPRESSION: 1. Transesophageal tube tip is in the gastric lumen while the side port remains at  the GE junction. Recommend advancement at least 5 cm to position the side port well within the gastric lumen for optimal function. 2. Gaseous distention of multiple upper abdominal bowel loops compatible with early or developing obstruction seen on CT. 3. Bibasilar atelectasis. 4. Features of ankylosing spondylitis. 5.  Aortic Atherosclerosis (ICD10-I70.0). Electronically Signed   By: Lovena Le M.D.   On: 03/09/2019 06:42   Ct Abdomen Pelvis W Contrast  Result Date: 03/09/2019 CLINICAL DATA:  Lower abdominal pain with nausea and vomiting EXAM: CT ABDOMEN AND PELVIS WITH CONTRAST TECHNIQUE: Multidetector CT imaging of the abdomen and pelvis was performed using the standard protocol following bolus administration of intravenous contrast. CONTRAST:  148mL OMNIPAQUE IOHEXOL 300 MG/ML  SOLN COMPARISON:  None. FINDINGS: LOWER CHEST: No basilar pleural or apical pericardial effusion. HEPATOBILIARY: Normal hepatic contours. There is no intra- or extrahepatic biliary dilatation. Status post cholecystectomy. PANCREAS: Normal pancreatic contours without pancreatic ductal dilatation or peripancreatic fluid collection. SPLEEN: Normal. ADRENALS/URINARY TRACT: --Adrenal glands: Normal. --Right kidney/ureter: Multiple calcifications, measuring up to 5 mm. No hydronephrosis, perinephric stranding or solid renal mass. --Left kidney/ureter: Multiple calcifications, measuring up to 3 mm. No hydronephrosis, perinephric stranding  or solid renal mass. --Urinary bladder: Normal for degree of distention STOMACH/BOWEL: No hiatal hernia. Normal duodenal course. There are 2 anastomoses in the left lower quadrant, unchanged. There is mildly dilated fluid-filled bowel predominantly in the left lower quadrant. VASCULAR/LYMPHATIC: There is calcific atherosclerosis of the abdominal aorta. No abdominal or pelvic lymphadenopathy. REPRODUCTIVE: Normal prostate size with symmetric seminal vesicles. MUSCULOSKELETAL. Right total hip arthroplasty.  There is fusion at both sacroiliac joints. There is diffuse thoracolumbar ankylosis. OTHER: None. IMPRESSION: 1. Multiple mildly dilated loops of small bowel with air-fluid levels, greatest at the site of the anterior left lower quadrant anastomosis. This could indicate developing obstruction. 2. Ankylosing spondylitis. 3.  Aortic atherosclerosis (ICD10-I70.0). Electronically Signed   By: Ulyses Jarred M.D.   On: 03/09/2019 05:04   Dg Abd Portable 1v-small Bowel Obstruction Protocol-initial, 8 Hr Delay  Result Date: 03/10/2019 CLINICAL DATA:  Small bowel obstruction EXAM: PORTABLE ABDOMEN - 1 VIEW COMPARISON:  03/10/2019.  CT 03/09/2019 FINDINGS: NG tube is in the stomach. Oral contrast material noted throughout the colon. Small bowel distention has decreased since prior study. IMPRESSION: Passage of oral contrast material into the colon. Decreasing gaseous distention of small bowel. Findings compatible with improving small bowel obstruction. Electronically Signed   By: Rolm Baptise M.D.   On: 03/10/2019 20:47   Dg Finger Little Right  Result Date: 02/21/2019 CLINICAL DATA:  Cut finger with table saw, laceration. Evaluate for fracture. EXAM: RIGHT LITTLE FINGER 2+V COMPARISON:  None. FINDINGS: There is no evidence of fracture or dislocation. Moderate osteoarthritis of the metacarpal phalangeal joint, mild osteoarthritis of the distal interphalangeal joint. No radiopaque foreign body. Site of laceration tentatively visualized projecting over the middle phalanx. IMPRESSION: 1. No fracture, dislocation, or radiopaque foreign body. 2. Osteoarthritis. Electronically Signed   By: Keith Rake M.D.   On: 02/21/2019 19:05       Subjective: Patient without complication Positive BMs Tolerating diet  Discharge Exam: Vitals:   03/11/19 2037 03/12/19 0513  BP: 102/60 119/63  Pulse: 60 (!) 58  Resp: 20 16  Temp: 97.6 F (36.4 C) 98 F (36.7 C)  SpO2: 100% 96%   Vitals:   03/11/19 0439 03/11/19  1232 03/11/19 2037 03/12/19 0513  BP: 117/60 110/70 102/60 119/63  Pulse: 62 64 60 (!) 58  Resp: 20 20 20 16   Temp: 98.4 F (36.9 C) 98 F (36.7 C) 97.6 F (36.4 C) 98 F (36.7 C)  TempSrc: Oral Oral Oral Oral  SpO2: 96% 100% 100% 96%  Weight: 62.2 kg     Height:        General: Pt is alert, awake, not in acute distress Cardiovascular: RRR, S1/S2 +, no rubs, no gallops Respiratory: CTA bilaterally, no wheezing, no rhonchi Abdominal: Soft, NT, ND, bowel sounds + Extremities: no edema, no cyanosis    The results of significant diagnostics from this hospitalization (including imaging, microbiology, ancillary and laboratory) are listed below for reference.     Microbiology: Recent Results (from the past 240 hour(s))  SARS CORONAVIRUS 2 (TAT 6-24 HRS) Nasopharyngeal Nasopharyngeal Swab     Status: None   Collection Time: 03/09/19  6:10 AM   Specimen: Nasopharyngeal Swab  Result Value Ref Range Status   SARS Coronavirus 2 NEGATIVE NEGATIVE Final    Comment: (NOTE) SARS-CoV-2 target nucleic acids are NOT DETECTED. The SARS-CoV-2 RNA is generally detectable in upper and lower respiratory specimens during the acute phase of infection. Negative results do not preclude SARS-CoV-2 infection, do not rule out  co-infections with other pathogens, and should not be used as the sole basis for treatment or other patient management decisions. Negative results must be combined with clinical observations, patient history, and epidemiological information. The expected result is Negative. Fact Sheet for Patients: SugarRoll.be Fact Sheet for Healthcare Providers: https://www.woods-mathews.com/ This test is not yet approved or cleared by the [place] FDA and  has been authorized for detection and/or diagnosis of SARS-CoV-2 by FDA under an Emergency Use Authorization (EUA). This EUA will remain  in effect (meaning this test can be used) for the  duration of the COVID-19 declaration under Section 56 4(b)(1) of the Act, 21 U.S.C. section 360bbb-3(b)(1), unless the authorization is terminated or revoked sooner. Performed at [facility], [place] [address] [place], [place] [address]      Labs: BNP (last 3 results) No results for input(s): BNP in the last 8760 hours. Basic Metabolic Panel: Recent Labs  Lab 03/09/19 0226 03/10/19 0421  NA 140 137  K 3.6 3.5  CL 100 101  CO2 26 26  GLUCOSE 197* 133*  BUN 21 12  CREATININE 0.87 0.83  CALCIUM 9.6 8.3*   Liver Function Tests: Recent Labs  Lab 03/09/19 0226 03/10/19 0421  AST 23 20  ALT 19 13  ALKPHOS 44 33*  BILITOT 0.9 1.2  PROT 8.0 6.3*  ALBUMIN 4.6 3.5   Recent Labs  Lab 03/09/19 0226  LIPASE 50   No results for input(s): AMMONIA in the last 168 hours. CBC: Recent Labs  Lab 03/09/19 0226 03/10/19 0421  WBC 14.3* 5.7  NEUTROABS 12.4*  --   HGB 11.1* 9.4*  HCT 35.8* 29.9*  MCV 88.8 87.9  PLT 374 294   Cardiac Enzymes: No results for input(s): CKTOTAL, CKMB, CKMBINDEX, TROPONINI in the last 168 hours. BNP: Invalid input(s): POCBNP CBG: Recent Labs  Lab 03/11/19 0558 03/11/19 1143 03/11/19 1745 03/12/19 0000 03/12/19 0511  GLUCAP 115* 148* 127* 96 96   D-Dimer No results for input(s): DDIMER in the last 72 hours. Hgb A1c No results for input(s): HGBA1C in the last 72 hours. Lipid Profile No results for input(s): CHOL, HDL, LDLCALC, TRIG, CHOLHDL, LDLDIRECT in the last 72 hours. Thyroid function studies No results for input(s): TSH, T4TOTAL, T3FREE, THYROIDAB in the last 72 hours.  Invalid input(s): FREET3 Anemia work up No results for input(s): VITAMINB12, FOLATE, FERRITIN, TIBC, IRON, RETICCTPCT in the last 72 hours. Urinalysis    Component Value Date/Time   COLORURINE YELLOW (A) 03/09/2019 0226   APPEARANCEUR HAZY (A) 03/09/2019 0226   APPEARANCEUR Hazy 04/18/2012 2324   LABSPEC 1.018 03/09/2019 0226   LABSPEC 1.018  04/18/2012 2324   PHURINE 6.0 03/09/2019 0226   GLUCOSEU NEGATIVE 03/09/2019 0226   GLUCOSEU Negative 04/18/2012 2324   HGBUR NEGATIVE 03/09/2019 0226   BILIRUBINUR NEGATIVE 03/09/2019 0226   BILIRUBINUR Negative 04/18/2012 2324   KETONESUR NEGATIVE 03/09/2019 0226   PROTEINUR NEGATIVE 03/09/2019 0226   UROBILINOGEN 0.2 04/09/2014 2138   NITRITE NEGATIVE 03/09/2019 0226   LEUKOCYTESUR NEGATIVE 03/09/2019 0226   LEUKOCYTESUR Negative 04/18/2012 2324   Sepsis Labs Invalid input(s): PROCALCITONIN,  WBC,  LACTICIDVEN Microbiology Recent Results (from the past 240 hour(s))  SARS CORONAVIRUS 2 (TAT 6-24 HRS) Nasopharyngeal Nasopharyngeal Swab     Status: None   Collection Time: 03/09/19  6:10 AM   Specimen: Nasopharyngeal Swab  Result Value Ref Range Status   SARS Coronavirus 2 NEGATIVE NEGATIVE Final    Comment: (NOTE) SARS-CoV-2 target nucleic acids are NOT  DETECTED. The SARS-CoV-2 RNA is generally detectable in upper and lower respiratory specimens during the acute phase of infection. Negative results do not preclude SARS-CoV-2 infection, do not rule out co-infections with other pathogens, and should not be used as the sole basis for treatment or other patient management decisions. Negative results must be combined with clinical observations, patient history, and epidemiological information. The expected result is Negative. Fact Sheet for Patients: SugarRoll.be Fact Sheet for Healthcare Providers: https://www.woods-mathews.com/ This test is not yet approved or cleared by the [place] FDA and  has been authorized for detection and/or diagnosis of SARS-CoV-2 by FDA under an Emergency Use Authorization (EUA). This EUA will remain  in effect (meaning this test can be used) for the duration of the COVID-19 declaration under Section 56 4(b)(1) of the Act, 21 U.S.C. section 360bbb-3(b)(1), unless the authorization is terminated or revoked  sooner. Performed at [facility], [place] [address] [place], [place] [address]      Time coordinating discharge: Over 30 minutes  SIGNED:   Nicolette Bang, MD  [facility] 03/12/2019, 11:04 AM Pager   If 7PM-7AM, please contact night-coverage [email or web address] Password [email or web address]

## 2019-03-12 NOTE — Progress Notes (Signed)
Subjective:  CC: Steven [name] is a 84 y.o. male  Hospital stay day 3,   SBO  HPI: Tolerating clears. Having more BMs. Patient states he is feeling better as well.  ROS:  General: Denies weight loss, weight gain, fatigue, fevers, chills, and night sweats. Heart: Denies chest pain, palpitations, racing heart, irregular heartbeat, leg pain or swelling, and decreased activity tolerance. Respiratory: Denies breathing difficulty, shortness of breath, wheezing, cough, and sputum. GI: Denies change in appetite, heartburn, nausea, vomiting, constipation, diarrhea, and blood in stool. GU: Denies difficulty urinating, pain with urinating, urgency, frequency, blood in urine.   Objective:   Temp:  [97.6 F (36.4 C)-98 F (36.7 C)] 98 F (36.7 C) (11/29 0513) Pulse Rate:  [58-64] 58 (11/29 0513) Resp:  [16-20] 16 (11/29 0513) BP: (102-119)/(60-70) 119/63 (11/29 0513) SpO2:  [96 %-100 %] 96 % (11/29 0513)     Height: 5\' 6"  (167.6 cm) Weight: 62.2 kg BMI (Calculated): 22.14   Intake/Output this shift:   Intake/Output Summary (Last 24 hours) at 03/12/2019 0834 Last data filed at 03/12/2019 0620 Gross per 24 hour  Intake 791.87 ml  Output 500 ml  Net 291.87 ml    Constitutional :  alert, cooperative, appears stated age and no distress  Respiratory:  clear to auscultation bilaterally  Cardiovascular:  regular rate and rhythm  Gastrointestinal: Soft, no guarding, minimal tenderness to palpation in right lower quadrant.  no distention.   Skin: Cool and moist.   Psychiatric: Normal affect, non-agitated, not confused       LABS:  CMP Latest Ref Rng & Units 03/10/2019 03/09/2019 12/07/2017  Glucose 70 - 99 mg/dL 133(H) 197(H) 86  BUN 8 - 23 mg/dL 12 21 18   Creatinine 0.61 - 1.24 mg/dL 0.83 0.87 0.82  Sodium 135 - 145 mmol/L 137 140 141  Potassium 3.5 - 5.1 mmol/L 3.5 3.6 4.4  Chloride 98 - 111 mmol/L 101 100 103  CO2 22 - 32 mmol/L 26 26 31   Calcium 8.9 - 10.3 mg/dL 8.3(L) 9.6 9.7   Total Protein 6.5 - 8.1 g/dL 6.3(L) 8.0 7.3  Total Bilirubin 0.3 - 1.2 mg/dL 1.2 0.9 0.4  Alkaline Phos 38 - 126 U/L 33(L) 44 73  AST 15 - 41 U/L 20 23 25   ALT 0 - 44 U/L 13 19 20    CBC Latest Ref Rng & Units 03/10/2019 03/09/2019 12/07/2017  WBC 4.0 - 10.5 K/uL 5.7 14.3(H) 8.0  Hemoglobin 13.0 - 17.0 g/dL 9.4(L) 11.1(L) 11.9(L)  Hematocrit 39.0 - 52.0 % 29.9(L) 35.8(L) 36.6(L)  Platelets 150 - 400 K/uL 294 374 352    RADS:  Assessment:   Small bowel obstruction.  Resolving. Tolerated clears.  Will advance to regular.  Ok to d/c from surgery standpoint once tolerating regular diet

## 2019-05-12 DIAGNOSIS — M452 Ankylosing spondylitis of cervical region: Secondary | ICD-10-CM | POA: Diagnosis not present

## 2019-05-12 DIAGNOSIS — H44111 Panuveitis, right eye: Secondary | ICD-10-CM | POA: Diagnosis not present

## 2019-05-12 DIAGNOSIS — Z961 Presence of intraocular lens: Secondary | ICD-10-CM | POA: Diagnosis not present

## 2019-05-12 DIAGNOSIS — Z79899 Other long term (current) drug therapy: Secondary | ICD-10-CM | POA: Diagnosis not present

## 2019-05-12 DIAGNOSIS — H35711 Central serous chorioretinopathy, right eye: Secondary | ICD-10-CM | POA: Diagnosis not present

## 2019-05-22 DIAGNOSIS — N182 Chronic kidney disease, stage 2 (mild): Secondary | ICD-10-CM | POA: Diagnosis not present

## 2019-05-22 DIAGNOSIS — E785 Hyperlipidemia, unspecified: Secondary | ICD-10-CM | POA: Diagnosis not present

## 2019-05-22 DIAGNOSIS — E1122 Type 2 diabetes mellitus with diabetic chronic kidney disease: Secondary | ICD-10-CM | POA: Diagnosis not present

## 2019-05-29 DIAGNOSIS — E1122 Type 2 diabetes mellitus with diabetic chronic kidney disease: Secondary | ICD-10-CM | POA: Diagnosis not present

## 2019-05-29 DIAGNOSIS — Z0001 Encounter for general adult medical examination with abnormal findings: Secondary | ICD-10-CM | POA: Diagnosis not present

## 2019-05-29 DIAGNOSIS — E785 Hyperlipidemia, unspecified: Secondary | ICD-10-CM | POA: Diagnosis not present

## 2019-06-01 ENCOUNTER — Telehealth: Payer: Self-pay | Admitting: Hematology and Oncology

## 2019-06-01 NOTE — Telephone Encounter (Signed)
Received a new hem referral from Dr. Shelia Media for anemia. Steven [name] has been cld and scheduled to see Dr. Lorenso Courier on 2/26 at 1pm. Pt aware to arrive 15 minutes early.

## 2019-06-02 ENCOUNTER — Telehealth: Payer: Self-pay | Admitting: Hematology and Oncology

## 2019-06-02 NOTE — Telephone Encounter (Signed)
[name] has been cld and rescheduled to see Dr. Lorenso Courier on 3/3 at 2pm.

## 2019-06-09 ENCOUNTER — Inpatient Hospital Stay: Payer: Medicare Other

## 2019-06-09 ENCOUNTER — Inpatient Hospital Stay: Payer: Medicare Other | Admitting: Hematology and Oncology

## 2019-06-12 DIAGNOSIS — R195 Other fecal abnormalities: Secondary | ICD-10-CM | POA: Diagnosis not present

## 2019-06-14 ENCOUNTER — Encounter: Payer: Self-pay | Admitting: Hematology and Oncology

## 2019-06-14 ENCOUNTER — Inpatient Hospital Stay: Payer: Medicare Other

## 2019-06-14 ENCOUNTER — Inpatient Hospital Stay: Payer: Medicare Other | Attending: Hematology and Oncology | Admitting: Hematology and Oncology

## 2019-06-14 ENCOUNTER — Other Ambulatory Visit: Payer: Self-pay

## 2019-06-14 VITALS — BP 125/65 | HR 65 | Temp 99.1°F | Resp 18 | Ht 66.0 in | Wt 143.4 lb

## 2019-06-14 DIAGNOSIS — F418 Other specified anxiety disorders: Secondary | ICD-10-CM | POA: Diagnosis not present

## 2019-06-14 DIAGNOSIS — R0602 Shortness of breath: Secondary | ICD-10-CM | POA: Insufficient documentation

## 2019-06-14 DIAGNOSIS — M129 Arthropathy, unspecified: Secondary | ICD-10-CM | POA: Diagnosis not present

## 2019-06-14 DIAGNOSIS — D649 Anemia, unspecified: Secondary | ICD-10-CM | POA: Insufficient documentation

## 2019-06-14 DIAGNOSIS — M459 Ankylosing spondylitis of unspecified sites in spine: Secondary | ICD-10-CM | POA: Diagnosis not present

## 2019-06-14 DIAGNOSIS — Z79899 Other long term (current) drug therapy: Secondary | ICD-10-CM | POA: Diagnosis not present

## 2019-06-14 DIAGNOSIS — K315 Obstruction of duodenum: Secondary | ICD-10-CM | POA: Insufficient documentation

## 2019-06-14 DIAGNOSIS — Z87891 Personal history of nicotine dependence: Secondary | ICD-10-CM | POA: Insufficient documentation

## 2019-06-14 DIAGNOSIS — Z87442 Personal history of urinary calculi: Secondary | ICD-10-CM | POA: Diagnosis not present

## 2019-06-14 DIAGNOSIS — E785 Hyperlipidemia, unspecified: Secondary | ICD-10-CM | POA: Diagnosis not present

## 2019-06-14 LAB — CMP (CANCER CENTER ONLY)
ALT: 22 U/L (ref 0–44)
AST: 24 U/L (ref 15–41)
Albumin: 4.1 g/dL (ref 3.5–5.0)
Alkaline Phosphatase: 66 U/L (ref 38–126)
Anion gap: 8 (ref 5–15)
BUN: 15 mg/dL (ref 8–23)
CO2: 28 mmol/L (ref 22–32)
Calcium: 9.3 mg/dL (ref 8.9–10.3)
Chloride: 103 mmol/L (ref 98–111)
Creatinine: 0.81 mg/dL (ref 0.61–1.24)
GFR, Est AFR Am: >60 mL/min (ref >60)
GFR, Estimated: >60 mL/min (ref >60)
Glucose, Bld: 93 mg/dL (ref 70–99)
Potassium: 4.3 mmol/L (ref 3.5–5.1)
Sodium: 139 mmol/L (ref 135–145)
Total Bilirubin: 0.5 mg/dL (ref 0.3–1.2)
Total Protein: 7.4 g/dL (ref 6.5–8.1)

## 2019-06-14 LAB — CBC WITH DIFFERENTIAL (CANCER CENTER ONLY)
Abs Immature Granulocytes: 0.01 10*3/uL (ref 0.00–0.07)
Basophils Absolute: 0 10*3/uL (ref 0.0–0.1)
Basophils Relative: 1 %
Eosinophils Absolute: 0.1 10*3/uL (ref 0.0–0.5)
Eosinophils Relative: 2 %
HCT: 33.2 % — ABNORMAL LOW (ref 39.0–52.0)
Hemoglobin: 10.3 g/dL — ABNORMAL LOW (ref 13.0–17.0)
Immature Granulocytes: 0 %
Lymphocytes Relative: 23 %
Lymphs Abs: 1.2 10*3/uL (ref 0.7–4.0)
MCH: 26.3 pg (ref 26.0–34.0)
MCHC: 31 g/dL (ref 30.0–36.0)
MCV: 84.9 fL (ref 80.0–100.0)
Monocytes Absolute: 0.3 10*3/uL (ref 0.1–1.0)
Monocytes Relative: 6 %
Neutro Abs: 3.4 10*3/uL (ref 1.7–7.7)
Neutrophils Relative %: 68 %
Platelet Count: 320 10*3/uL (ref 150–400)
RBC: 3.91 MIL/uL — ABNORMAL LOW (ref 4.22–5.81)
RDW: 18 % — ABNORMAL HIGH (ref 11.5–15.5)
WBC Count: 5 10*3/uL (ref 4.0–10.5)
nRBC: 0 % (ref 0.0–0.2)

## 2019-06-14 LAB — RETIC PANEL
Immature Retic Fract: 8.9 % (ref 2.3–15.9)
RBC.: 3.93 MIL/uL — ABNORMAL LOW (ref 4.22–5.81)
Retic Count, Absolute: 29.1 10*3/uL (ref 19.0–186.0)
Retic Ct Pct: 0.7 % (ref 0.4–3.1)
Reticulocyte Hemoglobin: 30.3 pg (ref >27.9)

## 2019-06-14 LAB — FOLATE: Folate: >100 ng/mL (ref >5.9)

## 2019-06-14 LAB — SAVE SMEAR(SSMR), FOR PROVIDER SLIDE REVIEW

## 2019-06-14 LAB — LACTATE DEHYDROGENASE: LDH: 174 U/L (ref 98–192)

## 2019-06-14 LAB — VITAMIN B12: Vitamin B-12: 303 pg/mL (ref 180–914)

## 2019-06-14 NOTE — Progress Notes (Signed)
[place] [phone]   Fax:([phone]) [place] NOTE  Patient Care Team: [name], MD as PCP - General (Internal Medicine)  Hematological/Oncological History #Normocytic Anemia 1) 01/01/2017: WBC 5.1, Hgb 11.0, Plt 254, MCV 91.6 2) 12/07/2017: WBC 8.0, Hgb 11.9, Plt 352, MCV 91.4. Iron 76, TIBC 359, Sat 21%, Ferritin 31 3) 03/09/2019: WBC 14.3, Hgb 11.1, Plt 374, MCV 88.8 4) 05/22/2019: WBC 5.5, Hgb 10.7, Plt 311, MCV 83.7. Routine PCP visit. 5) 06/14/2019: establish care with Dr. Lorenso Courier   CHIEF COMPLAINTS/PURPOSE OF CONSULTATION:  "Anemia "  HISTORY OF PRESENTING ILLNESS:  [name] 85 y.o. male with medical history significant for ankylosing spondylitis, HLD, DM type II, and anxiety who presents for evaluation of normocytic anemia.   On review of the previous records Mr. Thrun has had a low-grade anemia over the last several years.  Since 2018 he has had a baseline hemoglobin of approximately 11 with normal MCV.  Throughout this time.  He has not had any other hematological abnormalities with normal white blood cell counts normal differentials and normal platelet counts.  On 12/07/2017 the patient was found to have white blood cell count of 8.0, hemoglobin 11.9, platelets of 3 and 52, and MCV of 91.4.  Iron studies that time showed a sat of 21% and ferritin of 31.  Most recently on 05/22/2019 the patient was seen by his PCP for routine visit and was found to have white blood cell count of 5.5, hemoglobin 10.7, MCV of 83.7, and a platelet count of 311.  Due to concerns for his worsening anemia the patient was referred to hematology for further evaluation management.  On exam today Mr. Mairena notes that he feels well.  He reports that he has not had any overt signs of bleeding other than a lesion on the inside of his cheek.  He reports that there is a small bleeding lesion that he is not sure how it occurred.  He notes it is not painful like a canker  sore which has had before.  He reports that he is putting some Campho-Phenique on it and it has slowly been healing.  It is only been present for approximately 1 week.  He notes that otherwise he does not have any dark stools, nosebleeds, or bruising.  He notes that he did have a Hemoccult that he recently mailed into Dr. Concha Pyo.  On further discussion Mr. Hammen notes he has a history of ankylosing spondylitis.  He does endorse having prediabetes but is otherwise quite healthy.  He does take Tylenol for his left ear and he has not had any flares in recent history.  He reports that it is "all burned out".  He does endorse alcohol consumption of 1-2 drinks per day consisting predominantly of white wine and occasional martini.  He also was a former smoker but quit in the 1960s smoked for approximately 10 to 15 years.  He has no family history remarkable for any blood disorders or blood disease, though he does have a mother with a history of breast cancer and a brother who had pancreatic cancer.  Patient does note that he could use more energy, but is otherwise quite well.  He denies any fevers, chills, sweats, nausea, vomiting or diarrhea.  A full 10 point ROS is listed below.  MEDICAL HISTORY:  Past Medical History:  Diagnosis Date  . Allergy    enviromental  . Anemia 2017  . Anxiety   . Arthritis  ankylosing spodilitis  . Blood transfusion without reported diagnosis    during surgery  . Cataract   . Depression   . Dyspnea    with exertion - had Echo done 09/29/16  . History of kidney stones   . Hyperlipidemia   . Intestinal obstruction ([place])   . Pneumonia    as a child    SURGICAL HISTORY: Past Surgical History:  Procedure Laterality Date  . ABDOMINAL SURGERY     had abcess  . APPENDECTOMY    . CHOLECYSTECTOMY     with lysis of adhesions  . COLONOSCOPY    . COLOSTOMY    . COLOSTOMY REVERSAL    . EYE SURGERY Left    scar tissue removed from cornea  . EYE SURGERY Right    cataract  surgery with lens implant  . JOINT REPLACEMENT Right    hip  x 2 1999 and 2007  . SHOULDER ARTHROSCOPY WITH ROTATOR CUFF REPAIR AND SUBACROMIAL DECOMPRESSION Left 03/02/2013   Procedure: LEFT SHOULDER ARTHROSCOPY WITH SUBACROMIAL DECOMPRESSION DISTAL CALVICLE RESECTION AND ROTATOR CUFF REPAIR ;  Surgeon: [name], MD;  Location: [place];  Service: Orthopedics;  Laterality: Left;  . TOTAL HIP REVISION Right 11/06/2016   Procedure: TOTAL HIP REVISION OF THE ACETABULAR COMPONENT;  Surgeon: [name], MD;  Location: [place];  Service: Orthopedics;  Laterality: Right;    SOCIAL HISTORY: Social History   Socioeconomic History  . Marital status: Married    Spouse name: Not on file  . Number of children: 2  . Years of education: Not on file  . Highest education level: Not on file  Occupational History  . Occupation: retired  Tobacco Use  . Smoking status: Former Research scientist (life sciences)  . Smokeless tobacco: Never Used  Substance and Sexual Activity  . Alcohol use: Yes    Comment: 3 beers or glasses of wine a day--reports stopped ETOH 11/01/16   . Drug use: No  . Sexual activity: Never  Other Topics Concern  . Not on file  Social History Narrative  . Not on file   Social Determinants of Health   Financial Resource Strain: Low Risk   . Difficulty of Paying Living Expenses: Not hard at all  Food Insecurity: No Food Insecurity  . Worried About Charity fundraiser in the Last Year: Never true  . Ran Out of Food in the Last Year: Never true  Transportation Needs: No Transportation Needs  . Lack of Transportation (Medical): No  . Lack of Transportation (Non-Medical): No  Physical Activity: Insufficiently Active  . Days of Exercise per Week: 5 days  . Minutes of Exercise per Session: 10 min  Stress: No Stress Concern Present  . Feeling of Stress : Not at all  Social Connections: Unknown  . Frequency of Communication with Friends and Family: Patient refused  . Frequency of Social Gatherings with  Friends and Family: Patient refused  . Attends Religious Services: Patient refused  . Active Member of Clubs or Organizations: Patient refused  . Attends Archivist Meetings: Patient refused  . Marital Status: Patient refused  Intimate Partner Violence: Not At Risk  . Fear of Current or Ex-Partner: No  . Emotionally Abused: No  . Physically Abused: No  . Sexually Abused: No    FAMILY HISTORY: Family History  Problem Relation Age of Onset  . Heart attack Mother   . Diabetes Mother   . Breast cancer Mother   . Heart attack Maternal Grandfather   .  Pancreatic cancer Brother   . Colon cancer Neg Hx   . Esophageal cancer Neg Hx   . Stomach cancer Neg Hx     ALLERGIES:  is allergic to indomethacin and lactose intolerance (gi).  MEDICATIONS:  Current Outpatient Medications  Medication Sig Dispense Refill  . Artificial Tear Solution (SOOTHE XP) SOLN Place 1 drop into both eyes 2 (two) times daily.    . Biotin 5 MG CAPS Take 1 capsule by mouth daily.    . Calcium Carbonate-Vitamin D 600-400 MG-UNIT tablet Take 1 tablet by mouth 2 (two) times daily.    . cholecalciferol (VITAMIN D) 1000 units tablet Take 1,000 Units by mouth daily.    . citalopram (CELEXA) 40 MG tablet Take 40 mg by mouth daily.    . folic acid (FOLVITE) 1 MG tablet Take 1 mg by mouth daily.     . Iron-FA-B Cmp-C-Biot-Probiotic (FUSION PLUS) CAPS Take 1 tablet by mouth daily.   12  . methotrexate (RHEUMATREX) 2.5 MG tablet Take 25 mg by mouth once a week. Sunday  5  . Multiple Vitamins-Minerals (PRESERVISION AREDS 2 PO) Take 1 tablet by mouth 2 (two) times daily.    . prednisoLONE acetate (PRED FORTE) 1 % ophthalmic suspension Place 1 drop into the right eye 4 (four) times daily.  3  . Probiotic Product (PROBIOTIC PO) Take 1 capsule by mouth daily.    . simvastatin (ZOCOR) 5 MG tablet Take 10 mg by mouth at bedtime.     No current facility-administered medications for this visit.    REVIEW OF SYSTEMS:    Constitutional: ( - ) fevers, ( - )  chills , ( - ) night sweats Eyes: ( - ) blurriness of vision, ( - ) double vision, ( - ) watery eyes Ears, nose, mouth, throat, and face: ( - ) mucositis, ( - ) sore throat Respiratory: ( - ) cough, ( - ) dyspnea, ( - ) wheezes Cardiovascular: ( - ) palpitation, ( - ) chest discomfort, ( - ) lower extremity swelling Gastrointestinal:  ( - ) nausea, ( - ) heartburn, ( - ) change in bowel habits Skin: ( - ) abnormal skin rashes Lymphatics: ( - ) new lymphadenopathy, ( - ) easy bruising Neurological: ( - ) numbness, ( - ) tingling, ( - ) new weaknesses Behavioral/Psych: ( - ) mood change, ( - ) new changes  All other systems were reviewed with the patient and are negative.  PHYSICAL EXAMINATION: ECOG PERFORMANCE STATUS: 1 - Symptomatic but completely ambulatory  There were no vitals filed for this visit. There were no vitals filed for this visit.  GENERAL: well appearing elderly Caucasian male in NAD  SKIN: skin color, texture, turgor are normal, no rashes or significant lesions EYES: conjunctiva are pink and non-injected, sclera clear LUNGS: clear to auscultation and percussion with normal breathing effort HEART: regular rate & rhythm and no murmurs and no lower extremity edema ABDOMEN: soft, non-tender, non-distended, normal bowel sounds. No HSM appreciated. Musculoskeletal: no cyanosis of digits and no clubbing  PSYCH: alert & oriented x 3, fluent speech NEURO: no focal motor/sensory deficits  LABORATORY DATA:  I have reviewed the data as listed CBC Latest Ref Rng & Units 03/10/2019 03/09/2019 12/07/2017  WBC 4.0 - 10.5 K/uL 5.7 14.3(H) 8.0  Hemoglobin 13.0 - 17.0 g/dL 9.4(L) 11.1(L) 11.9(L)  Hematocrit 39.0 - 52.0 % 29.9(L) 35.8(L) 36.6(L)  Platelets 150 - 400 K/uL 294 374 352    CMP Latest Ref Rng &  Units 03/10/2019 03/09/2019 12/07/2017  Glucose 70 - 99 mg/dL 133(H) 197(H) 86  BUN 8 - 23 mg/dL '12 21 18  ' Creatinine 0.61 - 1.24 mg/dL 0.83  0.87 0.82  Sodium 135 - 145 mmol/L 137 140 141  Potassium 3.5 - 5.1 mmol/L 3.5 3.6 4.4  Chloride 98 - 111 mmol/L 101 100 103  CO2 22 - 32 mmol/L '26 26 31  ' Calcium 8.9 - 10.3 mg/dL 8.3(L) 9.6 9.7  Total Protein 6.5 - 8.1 g/dL 6.3(L) 8.0 7.3  Total Bilirubin 0.3 - 1.2 mg/dL 1.2 0.9 0.4  Alkaline Phos 38 - 126 U/L 33(L) 44 73  AST 15 - 41 U/L '20 23 25  ' ALT 0 - 44 U/L '13 19 20     ' PATHOLOGY: None relevant to review.  BLOOD FILM:  Review of the peripheral blood smear showed normal appearing white cells with neutrophils that were appropriately lobated and granulated. There was no predominance of bi-lobed or hyper-segmented neutrophils appreciated. No Dohle bodies were noted. There was no left shifting, immature forms or blasts noted. Lymphocytes remain normal in size without any predominance of large granular lymphocytes. Red cells show no anisopoikilocytosis, macrocytes , microcytes or polychromasia. There were no schistocytes, target cells, echinocytes, acanthocytes, dacrocytes, or stomatocytes. Rare elliptocyte. There was no rouleaux formation, nucleated red cells, or intra-cellular inclusions noted. The platelets are normal in size, shape, and color. Rare clumping event.  RADIOGRAPHIC STUDIES: None relevant to review.  No results found.  [place] L Hundal 85 y.o. male with medical history significant for ankylosing spondylitis, HLD, DM type II, and anxiety who presents for evaluation of normocytic anemia.  After review the lab discussion with the patient there is no clear etiology for the patient's normocytic anemia.  He has had stable and normal hemoglobins throughout most of his medical history and only recently has had a slow and steady decline.  Given the fact this is normocytic I am concerned about numerous possible etiologies including nutritional deficiency, bone marrow dysfunction, or possible hemolysis.  We will be evaluating all of these today.  Initially our work-up  will include nutritional panel including iron, copper, vitamin B12, and folate studies.  Additionally we will order LDH haptoglobin looking for hemolysis.  SPEP and SFLC will serve as our initial work-up for hematological malignancies.  In the event that no clear etiology can be discerned we would need to consider bone marrow biopsy in order to further evaluate.  Another less likely etiology would be anemia of chronic inflammation, though we would expect with his history of AS that this would have been an issue previously in his history.   GI evaluations: EGD 03/11/2017: normal stomach and esophagus. Mild duodenal stenosis Colonoscopy 11/17/2013: moderate diverticulosis  #Normocytic Anemia --today will order full nutritional workup to include iron studies, Vitamin b12, folate, MMA, and homocysteine --lysis evaluation with Hapto and LDH. Will also r/o Monoclonal gammopathy with SPEP and SFLC --review of peripheral blood film shows no concerning abnormalities, only rare platelet clumping and occasional elliptocyte.  --if no clear etiology can be discerned will need to consider Bmbx --awaiting the results of his last FOB card which he noted he shipped into Dr. Shelia Media recently. --RTC in 3 months or sooner if further workup is required.   No orders of the defined types were placed in this encounter.   All questions were answered. The patient knows to call the clinic with any problems, questions or concerns.  A total of more than 60 minutes  were spent on this encounter and over half of that time was spent on counseling and coordination of care as outlined above.   Ledell Peoples, MD Department of Hematology/Oncology [place] at [facility] Phone: [phone] Pager: [phone] Email: [email or web address]  06/14/2019 10:04 AM

## 2019-06-15 ENCOUNTER — Telehealth: Payer: Self-pay | Admitting: Hematology and Oncology

## 2019-06-15 LAB — KAPPA/LAMBDA LIGHT CHAINS
Kappa free light chain: 16.5 mg/L (ref 3.3–19.4)
Kappa, lambda light chain ratio: 1.15 (ref 0.26–1.65)
Lambda free light chains: 14.3 mg/L (ref 5.7–26.3)

## 2019-06-15 LAB — IRON AND TIBC
Iron: 32 ug/dL — ABNORMAL LOW (ref 42–163)
Saturation Ratios: 7 % — ABNORMAL LOW (ref 20–55)
TIBC: 429 ug/dL — ABNORMAL HIGH (ref 202–409)
UIBC: 397 ug/dL — ABNORMAL HIGH (ref 117–376)

## 2019-06-15 LAB — HAPTOGLOBIN: Haptoglobin: 172 mg/dL (ref 38–329)

## 2019-06-15 LAB — HOMOCYSTEINE: Homocysteine: 12.2 umol/L (ref 0.0–21.3)

## 2019-06-15 LAB — FERRITIN: Ferritin: 10 ng/mL — ABNORMAL LOW (ref 24–336)

## 2019-06-15 NOTE — Telephone Encounter (Signed)
Scheduled per los. Called and left msg. Mailed printout  °

## 2019-06-20 LAB — IMMUNOFIXATION REFLEX, SERUM
IgA: 278 mg/dL (ref 61–437)
IgG (Immunoglobin G), Serum: 1186 mg/dL (ref 603–1613)
IgM (Immunoglobulin M), Srm: 69 mg/dL (ref 15–143)

## 2019-06-20 LAB — PROTEIN ELECTROPHORESIS, SERUM, WITH REFLEX
A/G Ratio: 1.4 (ref 0.7–1.7)
Albumin ELP: 4 g/dL (ref 2.9–4.4)
Alpha-1-Globulin: 0.2 g/dL (ref 0.0–0.4)
Alpha-2-Globulin: 0.7 g/dL (ref 0.4–1.0)
Beta Globulin: 1 g/dL (ref 0.7–1.3)
Gamma Globulin: 0.9 g/dL (ref 0.4–1.8)
Globulin, Total: 2.9 g/dL (ref 2.2–3.9)
M-Spike, %: 0.6 g/dL — ABNORMAL HIGH
SPEP Interpretation: 0
Total Protein ELP: 6.9 g/dL (ref 6.0–8.5)

## 2019-06-20 LAB — METHYLMALONIC ACID, SERUM: Methylmalonic Acid, Quantitative: 104 nmol/L (ref 0–378)

## 2019-06-22 DIAGNOSIS — Z86007 Personal history of in-situ neoplasm of skin: Secondary | ICD-10-CM | POA: Diagnosis not present

## 2019-06-22 DIAGNOSIS — L821 Other seborrheic keratosis: Secondary | ICD-10-CM | POA: Diagnosis not present

## 2019-06-22 DIAGNOSIS — L814 Other melanin hyperpigmentation: Secondary | ICD-10-CM | POA: Diagnosis not present

## 2019-06-22 DIAGNOSIS — L570 Actinic keratosis: Secondary | ICD-10-CM | POA: Diagnosis not present

## 2019-06-22 DIAGNOSIS — L578 Other skin changes due to chronic exposure to nonionizing radiation: Secondary | ICD-10-CM | POA: Diagnosis not present

## 2019-06-30 ENCOUNTER — Other Ambulatory Visit: Payer: Self-pay | Admitting: *Deleted

## 2019-06-30 ENCOUNTER — Telehealth: Payer: Self-pay | Admitting: Hematology and Oncology

## 2019-06-30 DIAGNOSIS — I6529 Occlusion and stenosis of unspecified carotid artery: Secondary | ICD-10-CM

## 2019-06-30 MED ORDER — FERROUS SULFATE 325 (65 FE) MG PO TABS: 325.0000 mg | ORAL_TABLET | Freq: Every day | ORAL | 3 refills | Status: DC

## 2019-06-30 NOTE — Telephone Encounter (Signed)
Called Mr. Mulroy discussed the blood work from his prior visit.  Findings are consistent with an iron deficiency anemia.  The patient reports that he did take his stool cards back to Dr.Pharr and that these were negative.  He denies any other overt signs or symptoms of bleeding.  Of note he did undergo GI evaluation in 2018 at which time he had an EGD which showed no evidence of active bleeding, though there was note of possible prior history of peptic ulcer disease.  At this time I would recommend 325 mg iron p.o. daily taken with orange juice.  If this is difficult in the stomach he can switch to every other day dosing.  If this still is troublesome I recommend that he give [place] a call back for consideration of IV iron administration.  I did also note that we could place referral to GI for further GI evaluation, but given the negative stool cards and his recent evaluation 2018 the patient declined at this time.  I think given his advanced age this is a reasonable option.  Follow appointment scheduled for the patient in June 2021, we are happy to see him back in the event that there are any issues with further blood drop or intolerance of p.o. iron.  Ledell Peoples, MD Department of Hematology/Oncology [place] at [facility] Phone: [phone] Pager: [phone] Email: [email or web address]

## 2019-07-04 ENCOUNTER — Encounter: Payer: Medicare Other | Admitting: Vascular Surgery

## 2019-07-04 ENCOUNTER — Encounter (HOSPITAL_COMMUNITY): Payer: Medicare Other

## 2019-07-13 ENCOUNTER — Encounter: Payer: Self-pay | Admitting: Surgery

## 2019-08-08 DIAGNOSIS — H44111 Panuveitis, right eye: Secondary | ICD-10-CM | POA: Diagnosis not present

## 2019-08-08 DIAGNOSIS — Z961 Presence of intraocular lens: Secondary | ICD-10-CM | POA: Diagnosis not present

## 2019-08-08 DIAGNOSIS — H35711 Central serous chorioretinopathy, right eye: Secondary | ICD-10-CM | POA: Diagnosis not present

## 2019-08-08 DIAGNOSIS — Z79899 Other long term (current) drug therapy: Secondary | ICD-10-CM | POA: Diagnosis not present

## 2019-08-08 DIAGNOSIS — M452 Ankylosing spondylitis of cervical region: Secondary | ICD-10-CM | POA: Diagnosis not present

## 2019-08-25 ENCOUNTER — Encounter: Payer: Self-pay | Admitting: Vascular Surgery

## 2019-08-25 ENCOUNTER — Ambulatory Visit (INDEPENDENT_AMBULATORY_CARE_PROVIDER_SITE_OTHER): Payer: Medicare Other | Admitting: Vascular Surgery

## 2019-08-25 ENCOUNTER — Other Ambulatory Visit: Payer: Self-pay

## 2019-08-25 ENCOUNTER — Ambulatory Visit (HOSPITAL_COMMUNITY)
Admission: RE | Admit: 2019-08-25 | Discharge: 2019-08-25 | Disposition: A | Discharge status: Home / Self Care | Payer: Medicare Other | Source: Ambulatory Visit | Attending: Vascular Surgery | Admitting: Vascular Surgery

## 2019-08-25 VITALS — BP 173/80 | HR 62 | Temp 97.9°F | Resp 20 | Ht 66.0 in | Wt 146.0 lb

## 2019-08-25 DIAGNOSIS — I6529 Occlusion and stenosis of unspecified carotid artery: Secondary | ICD-10-CM

## 2019-08-25 NOTE — Progress Notes (Signed)
Patient ID: [name], male   DOB: October 14, 1934, 85 y.o.   MRN: [id number]  Reason for Consult: No chief complaint on file.   Referred by Steven Pretty, MD  Subjective:     HPI:  Steven [name] is a 85 y.o. male without significant vascular history.  Does have a history of hyperlipidemia.  Denies history of stroke, TIA or amaurosis.  Patient does not take any aspirin or Plavix but does take Crestor.  No lower extremity symptoms.  Patient remains very active.  Does not have any complaints related to today's visit.  Past Medical History:  Diagnosis Date  . Allergy    enviromental  . Anemia 2017  . Anxiety   . Arthritis    ankylosing spodilitis  . Blood transfusion without reported diagnosis    during surgery  . Cataract   . Depression   . Dyspnea    with exertion - had Echo done 09/29/16  . History of kidney stones   . Hyperlipidemia   . Intestinal obstruction ([place])   . Pneumonia    as a child   Family History  Problem Relation Age of Onset  . Heart attack Mother   . Diabetes Mother   . Breast cancer Mother   . Heart attack Maternal Grandfather   . Pancreatic cancer Brother   . Colon cancer Neg Hx   . Esophageal cancer Neg Hx   . Stomach cancer Neg Hx    Past Surgical History:  Procedure Laterality Date  . ABDOMINAL SURGERY     had abcess  . APPENDECTOMY    . CHOLECYSTECTOMY     with lysis of adhesions  . COLONOSCOPY    . COLOSTOMY    . COLOSTOMY REVERSAL    . EYE SURGERY Left    scar tissue removed from cornea  . EYE SURGERY Right    cataract surgery with lens implant  . JOINT REPLACEMENT Right    hip  x 2 1999 and 2007  . SHOULDER ARTHROSCOPY WITH ROTATOR CUFF REPAIR AND SUBACROMIAL DECOMPRESSION Left 03/02/2013   Procedure: LEFT SHOULDER ARTHROSCOPY WITH SUBACROMIAL DECOMPRESSION DISTAL CALVICLE RESECTION AND ROTATOR CUFF REPAIR ;  Surgeon: Steven Shutter, MD;  Location: [place];  Service: Orthopedics;  Laterality: Left;  . TOTAL HIP REVISION Right  11/06/2016   Procedure: TOTAL HIP REVISION OF THE ACETABULAR COMPONENT;  Surgeon: Steven Pear, MD;  Location: [place];  Service: Orthopedics;  Laterality: Right;    Short Social History:  Social History   Tobacco Use  . Smoking status: Former Research scientist (life sciences)  . Smokeless tobacco: Never Used  Substance Use Topics  . Alcohol use: Yes    Comment: 3 beers or glasses of wine a day--reports stopped ETOH 11/01/16     Allergies  Allergen Reactions  . Indomethacin Hives  . Lactose Intolerance (Gi)     GI UPSET    Current Outpatient Medications  Medication Sig Dispense Refill  . acetaminophen (TYLENOL) 500 MG tablet Take by mouth.    . Artificial Tear Solution (SOOTHE XP) SOLN Place 1 drop into both eyes 2 (two) times daily.    . B Complex-C (B-COMPLEX WITH VITAMIN C) tablet Take by mouth.    . Biotin 5 MG CAPS Take 1 capsule by mouth daily.    . Calcium Carbonate-Vit D-Min (CALCIUM 600+D PLUS MINERALS) 600-400 MG-UNIT TABS Take by mouth.    . Calcium Carbonate-Vitamin D 600-400 MG-UNIT tablet Take 1 tablet by mouth 2 (two) times daily.    Marland Kitchen  cholecalciferol (VITAMIN D) 1000 units tablet Take 1,000 Units by mouth daily.    . citalopram (CELEXA) 40 MG tablet Take 40 mg by mouth daily.    . ergocalciferol (VITAMIN D2) 1.25 MG (50000 UT) capsule Take by mouth.    . ferrous sulfate 325 (65 FE) MG tablet Take 1 tablet (325 mg total) by mouth daily with breakfast. Please take with a source of Vitamin C (such as orange juice) to promote absorption. 90 tablet 3  . folic acid (FOLVITE) 1 MG tablet Take 1 mg by mouth daily.     Marland Kitchen ipratropium (ATROVENT) 0.06 % nasal spray Place 2 sprays into both nostrils 2 (two) times daily.    . Iron-FA-B Cmp-C-Biot-Probiotic (FUSION PLUS) CAPS Take 1 tablet by mouth daily.   12  . meloxicam (MOBIC) 7.5 MG tablet     . methotrexate (RHEUMATREX) 2.5 MG tablet Take 25 mg by mouth once a week. Sunday  5  . Multiple Vitamins-Minerals (PRESERVISION AREDS 2 PO) Take 1 tablet by  mouth 2 (two) times daily.    . simvastatin (ZOCOR) 5 MG tablet Take 10 mg by mouth at bedtime.     No current facility-administered medications for this visit.    Review of Systems  Constitutional:  Constitutional negative. HENT: HENT negative.  Eyes: Eyes negative.  Respiratory: Respiratory negative.  Cardiovascular: Cardiovascular negative.  GI: Gastrointestinal negative.  Musculoskeletal: Musculoskeletal negative.  Skin: Skin negative.  Neurological: Neurological negative. Hematologic: Hematologic/lymphatic negative.  Psychiatric: Psychiatric negative.        Objective:  Objective  Vitals:   08/25/19 1521 08/25/19 1523  BP: (!) 183/67 (!) 173/80  Pulse: 62   Resp: 20   Temp: 97.9 F (36.6 C)   SpO2: 97%     Physical Exam HENT:     Head: Normocephalic.     Nose: Nose normal.     Mouth/Throat:     Mouth: Mucous membranes are moist.  Eyes:     Pupils: Pupils are equal, round, and reactive to light.  Neck:     Vascular: No carotid bruit.  Cardiovascular:     Rate and Rhythm: Normal rate and regular rhythm.     Pulses: Normal pulses.     Heart sounds: Normal heart sounds.  Pulmonary:     Effort: Pulmonary effort is normal.     Breath sounds: Normal breath sounds.  Abdominal:     General: Abdomen is flat.     Palpations: Abdomen is soft.  Musculoskeletal:        General: No swelling. Normal range of motion.     Cervical back: Normal range of motion and neck supple.  Skin:    General: Skin is warm and dry.     Capillary Refill: Capillary refill takes less than 2 seconds.  Neurological:     General: No focal deficit present.     Mental Status: He is alert.  Psychiatric:        Mood and Affect: Mood normal.        Behavior: Behavior normal.        Thought Content: Thought content normal.        Judgment: Judgment normal.     Data: I have independent interpreted his bilateral carotid duplex to have 1 to 39% stenosis bilaterally.     Assessment/Plan:      84  year old male was sent  for evaluation of bilateral carotid disease.  This is less than 40% on our studies today.  He can follow-up  in 2 years.  We discussed the signs symptoms of stroke.  I have asked him to take a baby aspirin he is agreed.     Waynetta Sandy MD [facility]

## 2019-08-29 ENCOUNTER — Other Ambulatory Visit: Payer: Self-pay | Admitting: *Deleted

## 2019-08-29 DIAGNOSIS — I6529 Occlusion and stenosis of unspecified carotid artery: Secondary | ICD-10-CM

## 2019-09-06 DIAGNOSIS — H6690 Otitis media, unspecified, unspecified ear: Secondary | ICD-10-CM | POA: Diagnosis not present

## 2019-09-06 DIAGNOSIS — H6090 Unspecified otitis externa, unspecified ear: Secondary | ICD-10-CM | POA: Diagnosis not present

## 2019-09-14 ENCOUNTER — Inpatient Hospital Stay: Payer: Medicare Other | Attending: Hematology and Oncology | Admitting: Hematology and Oncology

## 2019-09-14 ENCOUNTER — Inpatient Hospital Stay: Payer: Medicare Other

## 2019-09-14 ENCOUNTER — Encounter: Payer: Self-pay | Admitting: Hematology and Oncology

## 2019-09-14 ENCOUNTER — Other Ambulatory Visit: Payer: Self-pay | Admitting: Hematology and Oncology

## 2019-09-14 ENCOUNTER — Other Ambulatory Visit: Payer: Self-pay

## 2019-09-14 VITALS — BP 113/75 | HR 73 | Temp 98.1°F | Resp 15 | Wt 147.1 lb

## 2019-09-14 DIAGNOSIS — D500 Iron deficiency anemia secondary to blood loss (chronic): Secondary | ICD-10-CM

## 2019-09-14 DIAGNOSIS — E785 Hyperlipidemia, unspecified: Secondary | ICD-10-CM | POA: Insufficient documentation

## 2019-09-14 DIAGNOSIS — K315 Obstruction of duodenum: Secondary | ICD-10-CM | POA: Insufficient documentation

## 2019-09-14 DIAGNOSIS — Z803 Family history of malignant neoplasm of breast: Secondary | ICD-10-CM | POA: Diagnosis not present

## 2019-09-14 DIAGNOSIS — Z87891 Personal history of nicotine dependence: Secondary | ICD-10-CM | POA: Diagnosis not present

## 2019-09-14 DIAGNOSIS — Z87442 Personal history of urinary calculi: Secondary | ICD-10-CM | POA: Insufficient documentation

## 2019-09-14 DIAGNOSIS — Z79899 Other long term (current) drug therapy: Secondary | ICD-10-CM | POA: Insufficient documentation

## 2019-09-14 DIAGNOSIS — F418 Other specified anxiety disorders: Secondary | ICD-10-CM | POA: Diagnosis not present

## 2019-09-14 DIAGNOSIS — D649 Anemia, unspecified: Secondary | ICD-10-CM

## 2019-09-14 DIAGNOSIS — D509 Iron deficiency anemia, unspecified: Secondary | ICD-10-CM | POA: Insufficient documentation

## 2019-09-14 LAB — CBC WITH DIFFERENTIAL (CANCER CENTER ONLY)
Abs Immature Granulocytes: 0.03 10*3/uL (ref 0.00–0.07)
Basophils Absolute: 0.1 10*3/uL (ref 0.0–0.1)
Basophils Relative: 1 %
Eosinophils Absolute: 0.3 10*3/uL (ref 0.0–0.5)
Eosinophils Relative: 4 %
HCT: 33.3 % — ABNORMAL LOW (ref 39.0–52.0)
Hemoglobin: 10.9 g/dL — ABNORMAL LOW (ref 13.0–17.0)
Immature Granulocytes: 1 %
Lymphocytes Relative: 20 %
Lymphs Abs: 1.3 10*3/uL (ref 0.7–4.0)
MCH: 29.9 pg (ref 26.0–34.0)
MCHC: 32.7 g/dL (ref 30.0–36.0)
MCV: 91.2 fL (ref 80.0–100.0)
Monocytes Absolute: 0.5 10*3/uL (ref 0.1–1.0)
Monocytes Relative: 8 %
Neutro Abs: 4.1 10*3/uL (ref 1.7–7.7)
Neutrophils Relative %: 66 %
Platelet Count: 209 10*3/uL (ref 150–400)
RBC: 3.65 MIL/uL — ABNORMAL LOW (ref 4.22–5.81)
RDW: 18.8 % — ABNORMAL HIGH (ref 11.5–15.5)
WBC Count: 6.2 10*3/uL (ref 4.0–10.5)
nRBC: 0 % (ref 0.0–0.2)

## 2019-09-14 LAB — RETIC PANEL
Immature Retic Fract: 14.1 % (ref 2.3–15.9)
RBC.: 3.61 MIL/uL — ABNORMAL LOW (ref 4.22–5.81)
Retic Count, Absolute: 37.9 10*3/uL (ref 19.0–186.0)
Retic Ct Pct: 1.1 % (ref 0.4–3.1)
Reticulocyte Hemoglobin: 39.7 pg (ref >27.9)

## 2019-09-14 LAB — CMP (CANCER CENTER ONLY)
ALT: 17 U/L (ref 0–44)
AST: 19 U/L (ref 15–41)
Albumin: 3.6 g/dL (ref 3.5–5.0)
Alkaline Phosphatase: 45 U/L (ref 38–126)
Anion gap: 7 (ref 5–15)
BUN: 15 mg/dL (ref 8–23)
CO2: 28 mmol/L (ref 22–32)
Calcium: 8.9 mg/dL (ref 8.9–10.3)
Chloride: 104 mmol/L (ref 98–111)
Creatinine: 0.84 mg/dL (ref 0.61–1.24)
GFR, Est AFR Am: >60 mL/min (ref >60)
GFR, Estimated: >60 mL/min (ref >60)
Glucose, Bld: 117 mg/dL — ABNORMAL HIGH (ref 70–99)
Potassium: 4.1 mmol/L (ref 3.5–5.1)
Sodium: 139 mmol/L (ref 135–145)
Total Bilirubin: 0.7 mg/dL (ref 0.3–1.2)
Total Protein: 6.6 g/dL (ref 6.5–8.1)

## 2019-09-14 LAB — IRON AND TIBC
Iron: 64 ug/dL (ref 42–163)
Saturation Ratios: 18 % — ABNORMAL LOW (ref 20–55)
TIBC: 346 ug/dL (ref 202–409)
UIBC: 283 ug/dL (ref 117–376)

## 2019-09-14 LAB — FERRITIN: Ferritin: 46 ng/mL (ref 24–336)

## 2019-09-14 NOTE — Progress Notes (Signed)
[facility] [phone]   [phone]  PROGRESS NOTE  Patient Care Team: [name], MD as PCP - General (Internal Medicine)  Hematological/Oncological History #Normocytic Anemia #Iron Deficiency Anemia 1) 01/01/2017: WBC 5.1, Hgb 11.0, Plt 254, MCV 91.6 2) 12/07/2017: WBC 8.0, Hgb 11.9, Plt 352, MCV 91.4. Iron 76, TIBC 359, Sat 21%, Ferritin 31 3) 03/09/2019: WBC 14.3, Hgb 11.1, Plt 374, MCV 88.8 4) 05/22/2019: WBC 5.5, Hgb 10.7, Plt 311, MCV 83.7. Routine PCP visit. 5) 06/14/2019: establish care with Dr. Lorenso Courier   Interval History:  Steven [name] 85 y.o. male with medical history significant for iron deficiency anemia who presents for a follow up visit. The patient's last visit was on 06/14/2019 at which time he established care. In the interim since the last visit Steven [name] has had no major changes in his health.   On exam today Steven [name] notes that his energy and his breathing have been at baseline.  He has had some interval weight gain since his last visit, but has had no other changes in his health.  He notes that the stool cards he sent off after our last visit had one out of 3 return as positive.  He notes that his diet has not changed much and that he continues not to eat a lot of red meat.  His diet consists mostly of fruits and vegetables.  Fortunately he has had no issues with fevers, chills, sweats, nausea, vomiting or diarrhea.  He denies having any issues with shortness of breath or chest pain.  He continues to note no overt signs of bleeding, bruising, or dark stools.  A full 10 point ROS is listed below.  MEDICAL HISTORY:  Past Medical History:  Diagnosis Date  . Allergy    enviromental  . Anemia 2017  . Anxiety   . Arthritis    ankylosing spodilitis  . Blood transfusion without reported diagnosis    during surgery  . Cataract   . Depression   . Dyspnea    with exertion - had Echo done 09/29/16  . History of kidney stones   .  Hyperlipidemia   . Intestinal obstruction ([place])   . Pneumonia    as a child    SURGICAL HISTORY: Past Surgical History:  Procedure Laterality Date  . ABDOMINAL SURGERY     had abcess  . APPENDECTOMY    . CHOLECYSTECTOMY     with lysis of adhesions  . COLONOSCOPY    . COLOSTOMY    . COLOSTOMY REVERSAL    . EYE SURGERY Left    scar tissue removed from cornea  . EYE SURGERY Right    cataract surgery with lens implant  . JOINT REPLACEMENT Right    hip  x 2 1999 and 2007  . SHOULDER ARTHROSCOPY WITH ROTATOR CUFF REPAIR AND SUBACROMIAL DECOMPRESSION Left 03/02/2013   Procedure: LEFT SHOULDER ARTHROSCOPY WITH SUBACROMIAL DECOMPRESSION DISTAL CALVICLE RESECTION AND ROTATOR CUFF REPAIR ;  Surgeon: [name], MD;  Location: [facility];  Service: Orthopedics;  Laterality: Left;  . TOTAL HIP REVISION Right 11/06/2016   Procedure: TOTAL HIP REVISION OF THE ACETABULAR COMPONENT;  Surgeon: [name], MD;  Location: [place];  Service: Orthopedics;  Laterality: Right;    SOCIAL HISTORY: Social History   Socioeconomic History  . Marital status: Married    Spouse name: Not on file  . Number of children: 2  . Years of education: Not on file  . Highest education level: Not  on file  Occupational History  . Occupation: retired  Tobacco Use  . Smoking status: Former Research scientist (life sciences)  . Smokeless tobacco: Never Used  Substance and Sexual Activity  . Alcohol use: Yes    Comment: 3 beers or glasses of wine a day--reports stopped ETOH 11/01/16   . Drug use: No  . Sexual activity: Never  Other Topics Concern  . Not on file  Social History Narrative  . Not on file   Social Determinants of Health   Financial Resource Strain: Low Risk   . Difficulty of Paying Living Expenses: Not hard at all  Food Insecurity: No Food Insecurity  . Worried About Charity fundraiser in the Last Year: Never true  . Ran Out of Food in the Last Year: Never true  Transportation Needs: No Transportation Needs  . Lack of  Transportation (Medical): No  . Lack of Transportation (Non-Medical): No  Physical Activity: Insufficiently Active  . Days of Exercise per Week: 5 days  . Minutes of Exercise per Session: 10 min  Stress: No Stress Concern Present  . Feeling of Stress : Not at all  Social Connections: Unknown  . Frequency of Communication with Friends and Family: Patient refused  . Frequency of Social Gatherings with Friends and Family: Patient refused  . Attends Religious Services: Patient refused  . Active Member of Clubs or Organizations: Patient refused  . Attends Archivist Meetings: Patient refused  . Marital Status: Patient refused  Intimate Partner Violence: Not At Risk  . Fear of Current or Ex-Partner: No  . Emotionally Abused: No  . Physically Abused: No  . Sexually Abused: No    FAMILY HISTORY: Family History  Problem Relation Age of Onset  . Heart attack Mother   . Diabetes Mother   . Breast cancer Mother   . Heart attack Maternal Grandfather   . Pancreatic cancer Brother   . Colon cancer Neg Hx   . Esophageal cancer Neg Hx   . Stomach cancer Neg Hx     ALLERGIES:  is allergic to other; indomethacin; and lactose intolerance (gi).  MEDICATIONS:  Current Outpatient Medications  Medication Sig Dispense Refill  . acetaminophen (TYLENOL) 500 MG tablet Take by mouth.    . Artificial Tear Solution (SOOTHE XP) SOLN Place 1 drop into both eyes 2 (two) times daily.    . B Complex-C (B-COMPLEX WITH VITAMIN C) tablet Take by mouth.    . Biotin 5 MG CAPS Take 1 capsule by mouth daily.    . Calcium Carbonate-Vitamin D 600-400 MG-UNIT tablet Take 1 tablet by mouth 2 (two) times daily.    . cholecalciferol (VITAMIN D) 1000 units tablet Take 1,000 Units by mouth daily.    . citalopram (CELEXA) 40 MG tablet Take 40 mg by mouth daily.    . ferrous sulfate 325 (65 FE) MG tablet Take 1 tablet (325 mg total) by mouth daily with breakfast. Please take with a source of Vitamin C (such as  orange juice) to promote absorption. 90 tablet 3  . folic acid (FOLVITE) 1 MG tablet Take 1 mg by mouth daily.     Marland Kitchen ipratropium (ATROVENT) 0.06 % nasal spray Place 2 sprays into both nostrils 2 (two) times daily.    . methotrexate (RHEUMATREX) 2.5 MG tablet Take 25 mg by mouth once a week. Sunday  5  . rosuvastatin (CRESTOR) 10 MG tablet Take 10 mg by mouth daily.     No current facility-administered medications for this visit.  REVIEW OF SYSTEMS:   Constitutional: ( - ) fevers, ( - )  chills , ( - ) night sweats Eyes: ( - ) blurriness of vision, ( - ) double vision, ( - ) watery eyes Ears, nose, mouth, throat, and face: ( - ) mucositis, ( - ) sore throat Respiratory: ( - ) cough, ( - ) dyspnea, ( - ) wheezes Cardiovascular: ( - ) palpitation, ( - ) chest discomfort, ( - ) lower extremity swelling Gastrointestinal:  ( - ) nausea, ( - ) heartburn, ( - ) change in bowel habits Skin: ( - ) abnormal skin rashes Lymphatics: ( - ) new lymphadenopathy, ( - ) easy bruising Neurological: ( - ) numbness, ( - ) tingling, ( - ) new weaknesses Behavioral/Psych: ( - ) mood change, ( - ) new changes  All other systems were reviewed with the patient and are negative.  PHYSICAL EXAMINATION: ECOG PERFORMANCE STATUS: 1 - Symptomatic but completely ambulatory  Vitals:   09/14/19 1118  BP: 113/75  Pulse: 73  Resp: 15  Temp: 98.1 F (36.7 C)  SpO2: 100%   Filed Weights   09/14/19 1118  Weight: 147 lb 1.6 oz (66.7 kg)    GENERAL: well appearing elderly Caucasian male in NAD  SKIN: skin color, texture, turgor are normal, no rashes or significant lesions EYES: conjunctiva are pink and non-injected, sclera clear LUNGS: clear to auscultation and percussion with normal breathing effort HEART: regular rate & rhythm and no murmurs and no lower extremity edema ABDOMEN: soft, non-tender, non-distended, normal bowel sounds. No HSM appreciated. Musculoskeletal: no cyanosis of digits and no clubbing   PSYCH: alert & oriented x 3, fluent speech NEURO: no focal motor/sensory deficits  LABORATORY DATA:  I have reviewed the data as listed CBC Latest Ref Rng & Units 09/14/2019 06/14/2019 03/10/2019  WBC 4.0 - 10.5 K/uL 6.2 5.0 5.7  Hemoglobin 13.0 - 17.0 g/dL 10.9(L) 10.3(L) 9.4(L)  Hematocrit 39.0 - 52.0 % 33.3(L) 33.2(L) 29.9(L)  Platelets 150 - 400 K/uL 209 320 294    CMP Latest Ref Rng & Units 09/14/2019 06/14/2019 03/10/2019  Glucose 70 - 99 mg/dL 117(H) 93 133(H)  BUN 8 - 23 mg/dL 15 15 12   Creatinine 0.61 - 1.24 mg/dL 0.84 0.81 0.83  Sodium 135 - 145 mmol/L 139 139 137  Potassium 3.5 - 5.1 mmol/L 4.1 4.3 3.5  Chloride 98 - 111 mmol/L 104 103 101  CO2 22 - 32 mmol/L 28 28 26   Calcium 8.9 - 10.3 mg/dL 8.9 9.3 8.3(L)  Total Protein 6.5 - 8.1 g/dL 6.6 7.4 6.3(L)  Total Bilirubin 0.3 - 1.2 mg/dL 0.7 0.5 1.2  Alkaline Phos 38 - 126 U/L 45 66 33(L)  AST 15 - 41 U/L 19 24 20   ALT 0 - 44 U/L 17 22 13     No results found for: MPROTEIN Lab Results  Component Value Date   KPAFRELGTCHN 16.5 06/14/2019   LAMBDASER 14.3 06/14/2019   KAPLAMBRATIO 1.15 06/14/2019    RADIOGRAPHIC STUDIES: VAS US CAROTID  Result Date: 08/25/2019 Carotid Arterial Duplex Study Indications:       Bilateral carotid artery stenosis. Patient reports having                    intermittent dizziness and ataxia. He denies any other                    cerebrovascular symptoms. Risk Factors:      Hyperlipidemia. Comparison Study:  NA Performing Technologist: Sharlett Iles RVT  Examination Guidelines: A complete evaluation includes B-mode imaging, spectral Doppler, color Doppler, and power Doppler as needed of all accessible portions of each vessel. Bilateral testing is considered an integral part of a complete examination. Limited examinations for reoccurring indications may be performed as noted.  Right Carotid Findings: +----------+--------+--------+--------+------------------+--------+           PSV cm/sEDV  cm/sStenosisPlaque DescriptionComments +----------+--------+--------+--------+------------------+--------+ CCA Prox  82      17                                         +----------+--------+--------+--------+------------------+--------+ CCA Mid   78      15              heterogenous               +----------+--------+--------+--------+------------------+--------+ CCA Distal71      19              heterogenous               +----------+--------+--------+--------+------------------+--------+ ICA Prox  99      23      1-39%   calcific                   +----------+--------+--------+--------+------------------+--------+ ICA Mid   96      21              heterogenous               +----------+--------+--------+--------+------------------+--------+ ICA Distal51      13                                         +----------+--------+--------+--------+------------------+--------+ ECA       88      17              heterogenous               +----------+--------+--------+--------+------------------+--------+ +----------+--------+-------+----------------+-------------------+           PSV cm/sEDV cmsDescribe        Arm Pressure (mmHG) +----------+--------+-------+----------------+-------------------+ [id number]            Multiphasic, WNL                    +----------+--------+-------+----------------+-------------------+ +---------+--------+--+--------+--+---------+ VertebralPSV cm/s37EDV cm/s10Antegrade +---------+--------+--+--------+--+---------+ Prominent proximal ICA, measuring 1.0 cm AP x 1.0 cm TRV. Left Carotid Findings: +----------+--------+--------+--------+------------------+------------------+           PSV cm/sEDV cm/sStenosisPlaque DescriptionComments           +----------+--------+--------+--------+------------------+------------------+ CCA Prox  104     14                                                    +----------+--------+--------+--------+------------------+------------------+ CCA Distal82      13                                intimal thickening +----------+--------+--------+--------+------------------+------------------+ ICA Prox  176     28      1-39%   heterogenous                         +----------+--------+--------+--------+------------------+------------------+  ICA Mid   130     22              heterogenous                         +----------+--------+--------+--------+------------------+------------------+ ICA Distal77      19                                                   +----------+--------+--------+--------+------------------+------------------+ ECA       189     20                                                   +----------+--------+--------+--------+------------------+------------------+ +----------+--------+--------+----------------+-------------------+           PSV cm/sEDV cm/sDescribe        Arm Pressure (mmHG) +----------+--------+--------+----------------+-------------------+ [id number]     0       Multiphasic, WNL                    +----------+--------+--------+----------------+-------------------+ +---------+--------+--+--------+-+---------+ VertebralPSV cm/s29EDV cm/s6Antegrade +---------+--------+--+--------+-+---------+   Summary: Right Carotid: Velocities in the right ICA are consistent with a 1-39% stenosis.                Non-hemodynamically significant plaque <50% noted in the CCA. Left Carotid: Velocities in the left ICA are consistent with a 1-39% stenosis. Vertebrals:  Bilateral vertebral arteries demonstrate antegrade flow. Subclavians: Normal flow hemodynamics were seen in bilateral subclavian              arteries. *See table(s) above for measurements and observations.  Electronically signed by Servando Snare MD on 08/25/2019 at 3:08:32 PM.    Final     Steven [name] 85 y.o. male with medical  history significant for iron deficiency anemia who presents for a follow up visit.  After review of the labs and discussion with the patient the findings are most consistent with a modest response in the hemoglobin to p.o. iron supplementation.  There are 2 possibilities at this point.  The first would be that the patient is not appropriately absorbing the iron due to advanced age.  The other possibility is that the patient continues to have a source of bleeding, potentially a GI bleed which is causing this.  This therapy would be supported by the fact that one of the 3 of the stool cards he sent recently with his PCP returned as positive.    At this time I would recommend IV iron supplementation in order to appropriately correct his iron levels.  We can discuss further with the patient whether or not he would want to consider GI evaluation to determine if there is a concerning cause for his bleeding.  Of note on his prior colonoscopy in 2015 he was noted to have moderate diverticulosis.  We will plan to have Steven [name] follow-up in approximate 3 months time unless he desires IV iron in which case we will have him return to clinic sooner.  GI evaluations: EGD 03/11/2017: normal stomach and esophagus. Mild duodenal stenosis Colonoscopy 11/17/2013: moderate diverticulosis  #Normocytic Anemia --modest increase in Hgb to 10.9 from 10.3 noted at last visit.  --  patient endorses being compliant with his PO iron sulfate 325mg  PO daily with a source of vitamin C --labs today show increase in iron sat and ferritin, but only modestly. --will recommend IV iron to the patient. If he declines can continue of PO iron alone.  --if patient continues to have poorly responding iron may need to consider further GI evaluation. Steven [name] previously had a positive FOB card --RTC in 3 months or sooner if IV iron is desired.   No orders of the defined types were placed in this encounter.   All questions were answered. The  patient knows to call the clinic with any problems, questions or concerns.  A total of more than 30 minutes were spent on this encounter and over half of that time was spent on counseling and coordination of care as outlined above.   Ledell Peoples, MD Department of Hematology/Oncology [facility] Phone: [phone] Pager: [phone] Email: [email or web address]  09/16/2019 4:50 PM

## 2019-09-15 ENCOUNTER — Telehealth: Payer: Self-pay | Admitting: Hematology and Oncology

## 2019-09-15 NOTE — Telephone Encounter (Signed)
Scheduled per los. Called and spoke with patient. Confirmed appt 

## 2019-09-26 DIAGNOSIS — D649 Anemia, unspecified: Secondary | ICD-10-CM | POA: Diagnosis not present

## 2019-09-26 DIAGNOSIS — E559 Vitamin D deficiency, unspecified: Secondary | ICD-10-CM | POA: Diagnosis not present

## 2019-09-26 DIAGNOSIS — E785 Hyperlipidemia, unspecified: Secondary | ICD-10-CM | POA: Diagnosis not present

## 2019-09-26 DIAGNOSIS — E1122 Type 2 diabetes mellitus with diabetic chronic kidney disease: Secondary | ICD-10-CM | POA: Diagnosis not present

## 2019-09-26 DIAGNOSIS — D509 Iron deficiency anemia, unspecified: Secondary | ICD-10-CM | POA: Diagnosis not present

## 2019-11-01 ENCOUNTER — Emergency Department: Payer: Medicare Other

## 2019-11-01 ENCOUNTER — Other Ambulatory Visit: Payer: Self-pay

## 2019-11-01 ENCOUNTER — Inpatient Hospital Stay
Admission: EM | Admit: 2019-11-01 | Discharge: 2019-11-05 | DRG: 390 | Disposition: A | Discharge status: Home / Self Care | Payer: Medicare Other | Attending: Internal Medicine | Admitting: Internal Medicine

## 2019-11-01 DIAGNOSIS — M459 Ankylosing spondylitis of unspecified sites in spine: Secondary | ICD-10-CM

## 2019-11-01 DIAGNOSIS — F329 Major depressive disorder, single episode, unspecified: Secondary | ICD-10-CM

## 2019-11-01 DIAGNOSIS — Z79899 Other long term (current) drug therapy: Secondary | ICD-10-CM | POA: Diagnosis not present

## 2019-11-01 DIAGNOSIS — Z888 Allergy status to other drugs, medicaments and biological substances status: Secondary | ICD-10-CM

## 2019-11-01 DIAGNOSIS — N200 Calculus of kidney: Secondary | ICD-10-CM | POA: Diagnosis present

## 2019-11-01 DIAGNOSIS — K56609 Unspecified intestinal obstruction, unspecified as to partial versus complete obstruction: Secondary | ICD-10-CM | POA: Diagnosis present

## 2019-11-01 DIAGNOSIS — I700 Atherosclerosis of aorta: Secondary | ICD-10-CM | POA: Diagnosis not present

## 2019-11-01 DIAGNOSIS — R109 Unspecified abdominal pain: Secondary | ICD-10-CM | POA: Diagnosis not present

## 2019-11-01 DIAGNOSIS — J302 Other seasonal allergic rhinitis: Secondary | ICD-10-CM | POA: Diagnosis not present

## 2019-11-01 DIAGNOSIS — E876 Hypokalemia: Secondary | ICD-10-CM

## 2019-11-01 DIAGNOSIS — Z96641 Presence of right artificial hip joint: Secondary | ICD-10-CM | POA: Diagnosis not present

## 2019-11-01 DIAGNOSIS — M457 Ankylosing spondylitis of lumbosacral region: Secondary | ICD-10-CM | POA: Diagnosis not present

## 2019-11-01 DIAGNOSIS — K565 Intestinal adhesions [bands], unspecified as to partial versus complete obstruction: Principal | ICD-10-CM | POA: Diagnosis present

## 2019-11-01 DIAGNOSIS — F419 Anxiety disorder, unspecified: Secondary | ICD-10-CM | POA: Diagnosis present

## 2019-11-01 DIAGNOSIS — Z9049 Acquired absence of other specified parts of digestive tract: Secondary | ICD-10-CM | POA: Diagnosis not present

## 2019-11-01 DIAGNOSIS — Z7289 Other problems related to lifestyle: Secondary | ICD-10-CM | POA: Diagnosis not present

## 2019-11-01 DIAGNOSIS — Z20822 Contact with and (suspected) exposure to covid-19: Secondary | ICD-10-CM | POA: Diagnosis present

## 2019-11-01 DIAGNOSIS — F32A Depression, unspecified: Secondary | ICD-10-CM

## 2019-11-01 DIAGNOSIS — Z91011 Allergy to milk products: Secondary | ICD-10-CM

## 2019-11-01 DIAGNOSIS — N400 Enlarged prostate without lower urinary tract symptoms: Secondary | ICD-10-CM | POA: Diagnosis present

## 2019-11-01 DIAGNOSIS — Z87891 Personal history of nicotine dependence: Secondary | ICD-10-CM | POA: Diagnosis not present

## 2019-11-01 DIAGNOSIS — E785 Hyperlipidemia, unspecified: Secondary | ICD-10-CM | POA: Diagnosis present

## 2019-11-01 LAB — URINALYSIS, COMPLETE (UACMP) WITH MICROSCOPIC
Bacteria, UA: NONE SEEN
Bilirubin Urine: NEGATIVE
Glucose, UA: NEGATIVE mg/dL
Hgb urine dipstick: NEGATIVE
Ketones, ur: NEGATIVE mg/dL
Leukocytes,Ua: NEGATIVE
Nitrite: NEGATIVE
Protein, ur: NEGATIVE mg/dL
Specific Gravity, Urine: 1.015 (ref 1.005–1.030)
Squamous Epithelial / HPF: NONE SEEN (ref 0–5)
pH: 5 (ref 5.0–8.0)

## 2019-11-01 LAB — CBC
HCT: 38.4 % — ABNORMAL LOW (ref 39.0–52.0)
Hemoglobin: 12.6 g/dL — ABNORMAL LOW (ref 13.0–17.0)
MCH: 31.9 pg (ref 26.0–34.0)
MCHC: 32.8 g/dL (ref 30.0–36.0)
MCV: 97.2 fL (ref 80.0–100.0)
Platelets: 299 10*3/uL (ref 150–400)
RBC: 3.95 MIL/uL — ABNORMAL LOW (ref 4.22–5.81)
RDW: 14.6 % (ref 11.5–15.5)
WBC: 9.2 10*3/uL (ref 4.0–10.5)
nRBC: 0 % (ref 0.0–0.2)

## 2019-11-01 LAB — COMPREHENSIVE METABOLIC PANEL
ALT: 23 U/L (ref 0–44)
AST: 27 U/L (ref 15–41)
Albumin: 4.4 g/dL (ref 3.5–5.0)
Alkaline Phosphatase: 52 U/L (ref 38–126)
Anion gap: 11 (ref 5–15)
BUN: 13 mg/dL (ref 8–23)
CO2: 26 mmol/L (ref 22–32)
Calcium: 9.3 mg/dL (ref 8.9–10.3)
Chloride: 100 mmol/L (ref 98–111)
Creatinine, Ser: 0.83 mg/dL (ref 0.61–1.24)
GFR calc Af Amer: >60 mL/min (ref >60)
GFR calc non Af Amer: >60 mL/min (ref >60)
Glucose, Bld: 115 mg/dL — ABNORMAL HIGH (ref 70–99)
Potassium: 4.2 mmol/L (ref 3.5–5.1)
Sodium: 137 mmol/L (ref 135–145)
Total Bilirubin: 1.1 mg/dL (ref 0.3–1.2)
Total Protein: 7.6 g/dL (ref 6.5–8.1)

## 2019-11-01 LAB — LIPASE, BLOOD: Lipase: 34 U/L (ref 11–51)

## 2019-11-01 MED ORDER — ONDANSETRON HCL 4 MG/2ML IJ SOLN
INTRAMUSCULAR | Status: AC
  Administered 2019-11-02: 4 mg via INTRAVENOUS
  Filled 2019-11-01: qty 2

## 2019-11-01 MED ORDER — ONDANSETRON HCL 4 MG/2ML IJ SOLN: 4.0000 mg | Freq: Once | INTRAMUSCULAR | Status: AC

## 2019-11-01 NOTE — ED Triage Notes (Signed)
Pt in with co generalized abd pain since today, hx of abd blockage states feels the same. Last BM was this am states was normal. Pt denies any vomiting at this time.

## 2019-11-01 NOTE — ED Provider Notes (Signed)
[facility] Emergency Department Provider Note  ____________________________________________   First MD Initiated Contact with Patient 11/01/19 2341     (approximate)  I have reviewed the triage vital signs and the nursing notes.   HISTORY  Chief Complaint Abdominal Pain   HPI Steven [name] is a 85 y.o. male with below list of previous medical conditions including bowel obstruction most recent of which 03/03/2019 presents to the emergency department secondary to generalized abdominal discomfort with associated nausea which began this morning.  Patient states that he did have a bowel movement this morning which was normal for him.  Patient states that nature of his pain comes in waves currently pain score 3 out of 10 but does worsen periodically.  Patient denies any vomiting.  Patient denies any fever or urinary symptoms.        Past Medical History:  Diagnosis Date  . Allergy    enviromental  . Anemia 2017  . Anxiety   . Arthritis    ankylosing spodilitis  . Blood transfusion without reported diagnosis    during surgery  . Cataract   . Depression   . Dyspnea    with exertion - had Echo done 09/29/16  . History of kidney stones   . Hyperlipidemia   . Intestinal obstruction ([place])   . Pneumonia    as a child    Patient Active Problem List   Diagnosis Date Noted  . Small bowel obstruction ([place]) 03/09/2019  . Anemia 12/31/2017  . Failed arthroplasty ([place]) 11/06/2016  . 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 Dyspnea on exertion 09/11/2016  . SBO (small bowel obstruction) ([place]) 03/21/2015  . Abdominal pain 04/10/2014  . Bowel obstruction ([place]) 04/10/2014  . Hyperlipidemia 04/10/2014  . History of ankylosing spondylitis 04/10/2014    Past Surgical History:  Procedure Laterality Date  . ABDOMINAL SURGERY     had abcess  . APPENDECTOMY    . CHOLECYSTECTOMY     with lysis of adhesions  . COLONOSCOPY    . COLOSTOMY    .  COLOSTOMY REVERSAL    . EYE SURGERY Left    scar tissue removed from cornea  . EYE SURGERY Right    cataract surgery with lens implant  . JOINT REPLACEMENT Right    hip  x 2 1999 and 2007  . SHOULDER ARTHROSCOPY WITH ROTATOR CUFF REPAIR AND SUBACROMIAL DECOMPRESSION Left 03/02/2013   Procedure: LEFT SHOULDER ARTHROSCOPY WITH SUBACROMIAL DECOMPRESSION DISTAL CALVICLE RESECTION AND ROTATOR CUFF REPAIR ;  Surgeon: [name], MD;  Location: [place];  Service: Orthopedics;  Laterality: Left;  . TOTAL HIP REVISION Right 11/06/2016   Procedure: TOTAL HIP REVISION OF THE ACETABULAR COMPONENT;  Surgeon: [name], MD;  Location: [place];  Service: Orthopedics;  Laterality: Right;    Prior to Admission medications   Medication Sig Start Date End Date Taking? Authorizing Provider  acetaminophen (TYLENOL) 500 MG tablet Take by mouth.    [provider]  Artificial Tear Solution (SOOTHE XP) SOLN Place 1 drop into both eyes 2 (two) times daily.    [provider]  B Complex-C (B-COMPLEX WITH VITAMIN C) tablet Take by mouth.    [provider]  Biotin 5 MG CAPS Take 1 capsule by mouth daily.    [provider]  Calcium Carbonate-Vitamin D 600-400 MG-UNIT tablet Take 1 tablet by mouth 2 (two) times daily.    [provider]  cholecalciferol (VITAMIN D) 1000 units  tablet Take 1,000 Units by mouth daily.    [provider]  citalopram (CELEXA) 40 MG tablet Take 40 mg by mouth daily.    [provider]  ferrous sulfate 325 (65 FE) MG tablet Take 1 tablet (325 mg total) by mouth daily with breakfast. Please take with a source of Vitamin C (such as orange juice) to promote absorption. 06/30/19   Orson Slick, MD  folic acid (FOLVITE) 1 MG tablet Take 1 mg by mouth daily.     [provider]  ipratropium (ATROVENT) 0.06 % nasal spray Place 2 sprays into both nostrils 2 (two) times daily. 06/05/19   [provider]  methotrexate  (RHEUMATREX) 2.5 MG tablet Take 25 mg by mouth once a week. Sunday 09/15/16   [provider]  rosuvastatin (CRESTOR) 10 MG tablet Take 10 mg by mouth daily. 08/16/19   [provider]    Allergies Other, Indomethacin, and Lactose intolerance (gi)  Family History  Problem Relation Age of Onset  . Heart attack Mother   . Diabetes Mother   . Breast cancer Mother   . Heart attack Maternal Grandfather   . Pancreatic cancer Brother   . Colon cancer Neg Hx   . Esophageal cancer Neg Hx   . Stomach cancer Neg Hx     Social History Social History   Tobacco Use  . Smoking status: Former Research scientist (life sciences)  . Smokeless tobacco: Never Used  Vaping Use  . Vaping Use: Never used  Substance Use Topics  . Alcohol use: Yes    Comment: 3 beers or glasses of wine a day--reports stopped ETOH 11/01/16   . Drug use: No    Review of Systems Constitutional: No fever/chills Eyes: No visual changes. ENT: No sore throat. Cardiovascular: Denies chest pain. Respiratory: Denies shortness of breath. Gastrointestinal: Positive for abdominal pain nausea.  No diarrhea.  No constipation. Genitourinary: Negative for dysuria. Musculoskeletal: Negative for neck pain.  Negative for back pain. Integumentary: Negative for rash. Neurological: Negative for headaches, focal weakness or numbness.   ____________________________________________   PHYSICAL EXAM:  VITAL SIGNS: ED Triage Vitals  Enc Vitals Group     BP 11/01/19 1908 (!) 154/98     Pulse Rate 11/01/19 1908 70     Resp 11/01/19 1908 20     Temp 11/01/19 1908 99 F (37.2 C)     Temp Source 11/01/19 1908 Oral     SpO2 11/01/19 1908 98 %     Weight 11/01/19 1909 64.9 kg (143 lb)     Height 11/01/19 1909 1.676 m (5\' 6" )     Head Circumference --      Peak Flow --      Pain Score 11/01/19 1908 8     Pain Loc --      Pain Edu? --      Excl. in [place]? --     Constitutional: Alert and oriented.  Eyes: Conjunctivae are normal.  Head:  Atraumatic. Mouth/Throat: Patient is wearing a mask. Neck: No stridor.  No meningeal signs.   Cardiovascular: Normal rate, regular rhythm. Good peripheral circulation. Grossly normal heart sounds. Respiratory: Normal respiratory effort.  No retractions. Gastrointestinal: Right lower quadrant/right upper quadrant tenderness to palpation.. No distention.  Musculoskeletal: No lower extremity tenderness nor edema. No gross deformities of extremities. Neurologic:  Normal speech and language. No gross focal neurologic deficits are appreciated.  Skin:  Skin is warm, dry and intact. Psychiatric: Mood and affect are normal. Speech and  behavior are normal.  ____________________________________________   LABS (all labs ordered are listed, but only abnormal results are displayed)  Labs Reviewed  CBC - Abnormal; Notable for the following components:      Result Value   RBC 3.95 (*)    Hemoglobin 12.6 (*)    HCT 38.4 (*)    All other components within normal limits  COMPREHENSIVE METABOLIC PANEL - Abnormal; Notable for the following components:   Glucose, Bld 115 (*)    All other components within normal limits  URINALYSIS, COMPLETE (UACMP) WITH MICROSCOPIC - Abnormal; Notable for the following components:   Color, Urine YELLOW (*)    APPearance HAZY (*)    All other components within normal limits  SARS CORONAVIRUS 2 BY RT PCR (HOSPITAL ORDER, [facility])  LIPASE, BLOOD  CBC  BASIC METABOLIC PANEL   _______________________________________  RADIOLOGY I, Kiester, personally viewed and evaluated these images (plain radiographs) as part of my medical decision making, as well as reviewing the written report by the radiologist.  ED MD interpretation: No evidence of bowel obstruction on abdominal x-ray bilateral nephrolithiasis noted  Official radiology report(s): DG Abdomen 1 View  Result Date: 11/01/2019 CLINICAL DATA:  85 year old male with abdominal  pain. EXAM: ABDOMEN - 1 VIEW COMPARISON:  Abdominal radiograph dated 03/10/2019 and CT dated 03/09/2019 FINDINGS: There is no bowel dilatation or evidence of obstruction. Postsurgical changes of the bowel with anastomotic suture in the left lower quadrant. No free air identified. Right upper quadrant cholecystectomy clips. Several small bilateral renal calculi noted. There is osteopenia with degenerative changes and findings of ankylosing spondylitis. Right hip arthroplasty. No acute osseous pathology. IMPRESSION: 1. No evidence of bowel obstruction. 2. Bilateral renal calculi. 3. Ankylosing spondylitis. Electronically Signed   By: Anner Crete M.D.   On: 11/01/2019 19:24   CT ABDOMEN PELVIS W CONTRAST  Result Date: 11/02/2019 CLINICAL DATA:  Generalized abdominal pain, previous partial colectomy and history of bowel obstruction EXAM: CT ABDOMEN AND PELVIS WITH CONTRAST TECHNIQUE: Multidetector CT imaging of the abdomen and pelvis was performed using the standard protocol following bolus administration of intravenous contrast. CONTRAST:  171mL OMNIPAQUE IOHEXOL 300 MG/ML  SOLN COMPARISON:  03/09/2019 FINDINGS: Lower chest: No acute pleural or parenchymal lung disease. Hepatobiliary: No focal liver abnormality is seen. Status post cholecystectomy. No biliary dilatation. Pancreas: Unremarkable. No pancreatic ductal dilatation or surrounding inflammatory changes. Spleen: Normal in size without focal abnormality. Adrenals/Urinary Tract: There are bilateral nonobstructing renal calculi, not appreciably changed since prior study. Largest calculus on the right measures 5 mm. Scattered areas of bilateral renal cortical scarring are again noted unchanged. The adrenals are normal. Bladder is minimally distended with no focal abnormality. Stomach/Bowel: Previous sigmoid colon resection and reanastomosis. Prior partial small-bowel resection. There are multiple dilated loops of small bowel, with decompression of the  terminal ileum. Small bowel measures up to 3 cm in diameter, compatible with small-bowel obstruction. No bowel wall thickening or inflammatory change. Vascular/Lymphatic: Aortic atherosclerosis. No enlarged abdominal or pelvic lymph nodes. Reproductive: Stable enlargement of the prostate. Other: No free fluid or free gas.  No abdominal wall hernia. Musculoskeletal: No acute or destructive bony lesions. Stable right hip osteoarthritis. Reconstructed images demonstrate ankylosing spondylitis, stable. IMPRESSION: 1. Small-bowel obstruction, with transition point within the ileum in the lower abdomen/pelvis. 2. Bilateral nonobstructing renal calculi. 3. Stable enlargement of the prostate. 4. Aortic Atherosclerosis (ICD10-I70.0). Electronically Signed   By: Randa Ngo M.D.   On: 11/02/2019  00:32    _______________________________________  Procedures   ____________________________________________   INITIAL IMPRESSION / MDM / [place] / ED COURSE  As part of my medical decision making, I reviewed the following data within the electronic MEDICAL RECORD NUMBER  85 year old male presenting with above-stated history and physical exam turning for bowel obstruction 1 view x-ray revealed no evidence of obstruction however CT scan did reveal a small bowel obstruction as noted above.  NG tube was placed.  Patient discussed with Dr. Gayla Medicus for hospital admission further evaluation and management.  ____________________________________________  FINAL CLINICAL IMPRESSION(S) / ED DIAGNOSES  Final diagnoses:  SBO (small bowel obstruction) ([place])     MEDICATIONS GIVEN DURING THIS VISIT:  Medications  methotrexate (RHEUMATREX) tablet 25 mg ( Oral Canceled Entry 11/02/19 0137)  rosuvastatin (CRESTOR) tablet 10 mg (has no administration in time range)  citalopram (CELEXA) tablet 40 mg (has no administration in time range)  ferrous sulfate tablet 325 mg (has no administration in time range)  folic acid  (FOLVITE) tablet 1 mg (has no administration in time range)  B-complex with vitamin C tablet 1 tablet (has no administration in time range)  Biotin CAPS 5 mg (has no administration in time range)  calcium-vitamin D (OSCAL WITH D) 500-200 MG-UNIT per tablet 1 tablet ( Oral Canceled Entry 11/02/19 0136)  cholecalciferol (VITAMIN D) tablet 1,000 Units (has no administration in time range)  ipratropium (ATROVENT) 0.06 % nasal spray 2 spray ( Each Nare Canceled Entry 11/02/19 0136)  Soothe XP SOLN 1 drop ( Both Eyes Canceled Entry 11/02/19 0137)  enoxaparin (LOVENOX) injection 40 mg (has no administration in time range)  0.9 %  sodium chloride infusion ( Intravenous New Bag/Given 11/02/19 0349)  acetaminophen (TYLENOL) tablet 650 mg (has no administration in time range)    Or  acetaminophen (TYLENOL) suppository 650 mg (has no administration in time range)  traZODone (DESYREL) tablet 25 mg (has no administration in time range)  magnesium hydroxide (MILK OF MAGNESIA) suspension 30 mL (has no administration in time range)  ondansetron (ZOFRAN) tablet 4 mg ( Oral See Alternative 11/02/19 0354)    Or  ondansetron (ZOFRAN) injection 4 mg (4 mg Intravenous Given 11/02/19 0354)  morphine 2 MG/ML injection 2 mg (has no administration in time range)  ondansetron (ZOFRAN) injection 4 mg (4 mg Intravenous Given 11/02/19 0002)  iohexol (OMNIPAQUE) 300 MG/ML solution 100 mL (100 mLs Intravenous Contrast Given 11/02/19 0017)  sodium chloride 0.9 % bolus 1,000 mL (1,000 mLs Intravenous New Bag/Given 11/02/19 0100)     ED Discharge Orders    None      *Please note:  Steven [name] was evaluated in Emergency Department on 11/02/2019 for the symptoms described in the history of present illness. He was evaluated in the context of the global COVID-19 pandemic, which necessitated consideration that the patient might be at risk for infection with the SARS-CoV-2 virus that causes COVID-19. Institutional protocols and  algorithms that pertain to the evaluation of patients at risk for COVID-19 are in a state of rapid change based on information released by regulatory bodies including the CDC and federal and state organizations. These policies and algorithms were followed during the patient's care in the ED.  Some ED evaluations and interventions may be delayed as a result of limited staffing during and after the pandemic.*  Note:  This document was prepared using Dragon voice recognition software and may include unintentional dictation errors.   Gregor Hams, MD 11/02/19 [phone]

## 2019-11-02 ENCOUNTER — Inpatient Hospital Stay: Payer: Medicare Other

## 2019-11-02 ENCOUNTER — Encounter: Payer: Self-pay | Admitting: Radiology

## 2019-11-02 ENCOUNTER — Emergency Department: Payer: Medicare Other

## 2019-11-02 DIAGNOSIS — N400 Enlarged prostate without lower urinary tract symptoms: Secondary | ICD-10-CM | POA: Diagnosis present

## 2019-11-02 DIAGNOSIS — N200 Calculus of kidney: Secondary | ICD-10-CM | POA: Diagnosis not present

## 2019-11-02 DIAGNOSIS — K565 Intestinal adhesions [bands], unspecified as to partial versus complete obstruction: Secondary | ICD-10-CM | POA: Diagnosis present

## 2019-11-02 DIAGNOSIS — K56609 Unspecified intestinal obstruction, unspecified as to partial versus complete obstruction: Secondary | ICD-10-CM | POA: Diagnosis not present

## 2019-11-02 DIAGNOSIS — F32A Depression, unspecified: Secondary | ICD-10-CM

## 2019-11-02 DIAGNOSIS — F329 Major depressive disorder, single episode, unspecified: Secondary | ICD-10-CM

## 2019-11-02 DIAGNOSIS — Z79899 Other long term (current) drug therapy: Secondary | ICD-10-CM | POA: Diagnosis not present

## 2019-11-02 DIAGNOSIS — Z20822 Contact with and (suspected) exposure to covid-19: Secondary | ICD-10-CM | POA: Diagnosis present

## 2019-11-02 DIAGNOSIS — M1612 Unilateral primary osteoarthritis, left hip: Secondary | ICD-10-CM | POA: Diagnosis not present

## 2019-11-02 DIAGNOSIS — M459 Ankylosing spondylitis of unspecified sites in spine: Secondary | ICD-10-CM | POA: Diagnosis not present

## 2019-11-02 DIAGNOSIS — E876 Hypokalemia: Secondary | ICD-10-CM | POA: Diagnosis not present

## 2019-11-02 DIAGNOSIS — E785 Hyperlipidemia, unspecified: Secondary | ICD-10-CM

## 2019-11-02 DIAGNOSIS — Z87891 Personal history of nicotine dependence: Secondary | ICD-10-CM | POA: Diagnosis not present

## 2019-11-02 DIAGNOSIS — Z888 Allergy status to other drugs, medicaments and biological substances status: Secondary | ICD-10-CM | POA: Diagnosis not present

## 2019-11-02 DIAGNOSIS — Z91011 Allergy to milk products: Secondary | ICD-10-CM | POA: Diagnosis not present

## 2019-11-02 DIAGNOSIS — J302 Other seasonal allergic rhinitis: Secondary | ICD-10-CM | POA: Diagnosis present

## 2019-11-02 DIAGNOSIS — I700 Atherosclerosis of aorta: Secondary | ICD-10-CM | POA: Diagnosis not present

## 2019-11-02 DIAGNOSIS — Z96641 Presence of right artificial hip joint: Secondary | ICD-10-CM | POA: Diagnosis present

## 2019-11-02 DIAGNOSIS — M457 Ankylosing spondylitis of lumbosacral region: Secondary | ICD-10-CM | POA: Diagnosis not present

## 2019-11-02 DIAGNOSIS — Z7289 Other problems related to lifestyle: Secondary | ICD-10-CM | POA: Diagnosis not present

## 2019-11-02 DIAGNOSIS — F419 Anxiety disorder, unspecified: Secondary | ICD-10-CM | POA: Diagnosis present

## 2019-11-02 DIAGNOSIS — Z9049 Acquired absence of other specified parts of digestive tract: Secondary | ICD-10-CM | POA: Diagnosis not present

## 2019-11-02 LAB — CBC
HCT: 37.8 % — ABNORMAL LOW (ref 39.0–52.0)
Hemoglobin: 12.4 g/dL — ABNORMAL LOW (ref 13.0–17.0)
MCH: 32.2 pg (ref 26.0–34.0)
MCHC: 32.8 g/dL (ref 30.0–36.0)
MCV: 98.2 fL (ref 80.0–100.0)
Platelets: 284 10*3/uL (ref 150–400)
RBC: 3.85 MIL/uL — ABNORMAL LOW (ref 4.22–5.81)
RDW: 14.5 % (ref 11.5–15.5)
WBC: 9.5 10*3/uL (ref 4.0–10.5)
nRBC: 0 % (ref 0.0–0.2)

## 2019-11-02 LAB — BASIC METABOLIC PANEL
Anion gap: 7 (ref 5–15)
BUN: 13 mg/dL (ref 8–23)
CO2: 31 mmol/L (ref 22–32)
Calcium: 9 mg/dL (ref 8.9–10.3)
Chloride: 99 mmol/L (ref 98–111)
Creatinine, Ser: 0.86 mg/dL (ref 0.61–1.24)
GFR calc Af Amer: >60 mL/min (ref >60)
GFR calc non Af Amer: >60 mL/min (ref >60)
Glucose, Bld: 145 mg/dL — ABNORMAL HIGH (ref 70–99)
Potassium: 4.4 mmol/L (ref 3.5–5.1)
Sodium: 137 mmol/L (ref 135–145)

## 2019-11-02 LAB — SARS CORONAVIRUS 2 BY RT PCR (HOSPITAL ORDER, PERFORMED IN ~~LOC~~ HOSPITAL LAB): SARS Coronavirus 2: NEGATIVE

## 2019-11-02 MED ORDER — FERROUS SULFATE 325 (65 FE) MG PO TABS
325.0000 mg | ORAL_TABLET | Freq: Every day | ORAL | Status: DC
  Filled 2019-11-02: qty 1

## 2019-11-02 MED ORDER — CITALOPRAM HYDROBROMIDE 20 MG PO TABS: 40.0000 mg | ORAL_TABLET | Freq: Every day | ORAL | Status: DC

## 2019-11-02 MED ORDER — HYPROMELLOSE (GONIOSCOPIC) 2.5 % OP SOLN
1.0000 [drp] | Freq: Two times a day (BID) | OPHTHALMIC | Status: DC
  Filled 2019-11-02: qty 15

## 2019-11-02 MED ORDER — ONDANSETRON HCL 4 MG/2ML IJ SOLN
4.0000 mg | Freq: Four times a day (QID) | INTRAMUSCULAR | Status: DC | PRN
  Administered 2019-11-02: 4 mg via INTRAVENOUS
  Filled 2019-11-02: qty 2

## 2019-11-02 MED ORDER — IPRATROPIUM BROMIDE 0.06 % NA SOLN
2.0000 | Freq: Two times a day (BID) | NASAL | Status: DC
  Filled 2019-11-02: qty 15

## 2019-11-02 MED ORDER — ONDANSETRON HCL 4 MG PO TABS: 4.0000 mg | ORAL_TABLET | Freq: Four times a day (QID) | ORAL | Status: DC | PRN

## 2019-11-02 MED ORDER — CALCIUM CARBONATE-VITAMIN D 500-200 MG-UNIT PO TABS
1.0000 | ORAL_TABLET | Freq: Two times a day (BID) | ORAL | Status: DC
  Filled 2019-11-02 (×2): qty 1

## 2019-11-02 MED ORDER — BIOTIN 5 MG PO CAPS: 1.0000 | ORAL_CAPSULE | Freq: Every day | ORAL | Status: DC

## 2019-11-02 MED ORDER — POLYVINYL ALCOHOL 1.4 % OP SOLN
1.0000 [drp] | Freq: Two times a day (BID) | OPHTHALMIC | Status: DC
  Administered 2019-11-02 – 2019-11-05 (×7): 1 [drp] via OPHTHALMIC
  Filled 2019-11-02: qty 15

## 2019-11-02 MED ORDER — METHOTREXATE 2.5 MG PO TABS
20.0000 mg | ORAL_TABLET | ORAL | Status: DC
  Filled 2019-11-02: qty 8

## 2019-11-02 MED ORDER — METHOTREXATE 2.5 MG PO TABS: 25.0000 mg | ORAL_TABLET | ORAL | Status: DC

## 2019-11-02 MED ORDER — SODIUM CHLORIDE 0.9 % IV BOLUS
1000.0000 mL | Freq: Once | INTRAVENOUS | Status: AC
  Administered 2019-11-02: 1000 mL via INTRAVENOUS

## 2019-11-02 MED ORDER — B COMPLEX-C PO TABS
1.0000 | ORAL_TABLET | Freq: Every day | ORAL | Status: DC
  Filled 2019-11-02 (×4): qty 1

## 2019-11-02 MED ORDER — ACETAMINOPHEN 650 MG RE SUPP: 650.0000 mg | Freq: Four times a day (QID) | RECTAL | Status: DC | PRN

## 2019-11-02 MED ORDER — ROSUVASTATIN CALCIUM 10 MG PO TABS
10.0000 mg | ORAL_TABLET | Freq: Every day | ORAL | Status: DC
  Filled 2019-11-02: qty 1

## 2019-11-02 MED ORDER — FOLIC ACID 1 MG PO TABS
3.0000 mg | ORAL_TABLET | Freq: Every day | ORAL | Status: DC
  Filled 2019-11-02: qty 3

## 2019-11-02 MED ORDER — TRAZODONE HCL 50 MG PO TABS: 25.0000 mg | ORAL_TABLET | Freq: Every evening | ORAL | Status: DC | PRN

## 2019-11-02 MED ORDER — CITALOPRAM HYDROBROMIDE 20 MG PO TABS
20.0000 mg | ORAL_TABLET | Freq: Every day | ORAL | Status: DC
  Filled 2019-11-02: qty 1

## 2019-11-02 MED ORDER — SODIUM CHLORIDE 0.9 % IV SOLN: INTRAVENOUS | Status: DC

## 2019-11-02 MED ORDER — ENOXAPARIN SODIUM 40 MG/0.4ML ~~LOC~~ SOLN
40.0000 mg | SUBCUTANEOUS | Status: DC
  Administered 2019-11-02 – 2019-11-05 (×4): 40 mg via SUBCUTANEOUS
  Filled 2019-11-02 (×4): qty 0.4

## 2019-11-02 MED ORDER — SOOTHE XP OP SOLN: 1.0000 [drp] | Freq: Two times a day (BID) | OPHTHALMIC | Status: DC

## 2019-11-02 MED ORDER — MAGNESIUM HYDROXIDE 400 MG/5ML PO SUSP: 30.0000 mL | Freq: Every day | ORAL | Status: DC | PRN

## 2019-11-02 MED ORDER — FOLIC ACID 1 MG PO TABS: 1.0000 mg | ORAL_TABLET | Freq: Every day | ORAL | Status: DC

## 2019-11-02 MED ORDER — IOHEXOL 300 MG/ML  SOLN
100.0000 mL | Freq: Once | INTRAMUSCULAR | Status: AC | PRN
  Administered 2019-11-02: 100 mL via INTRAVENOUS

## 2019-11-02 MED ORDER — VITAMIN D3 25 MCG (1000 UNIT) PO TABS
1000.0000 [IU] | ORAL_TABLET | Freq: Every day | ORAL | Status: DC
  Filled 2019-11-02 (×5): qty 1

## 2019-11-02 MED ORDER — MORPHINE SULFATE (PF) 2 MG/ML IV SOLN: 2.0000 mg | INTRAVENOUS | Status: DC | PRN

## 2019-11-02 MED ORDER — ACETAMINOPHEN 325 MG PO TABS
650.0000 mg | ORAL_TABLET | Freq: Four times a day (QID) | ORAL | Status: DC | PRN
  Administered 2019-11-03: 650 mg via ORAL
  Filled 2019-11-02: qty 2

## 2019-11-02 NOTE — ED Notes (Signed)
Vital signs stable. 

## 2019-11-02 NOTE — Consult Note (Addendum)
[facility] SURGICAL CONSULTATION NOTE (initial) - cpt: 40086  HISTORY OF PRESENT ILLNESS (HPI):  85 y.o. male presented to [facility] ED yesterday evening for evaluation of abdominal pain. Patient reports a 1 day history of generalized abdominal discomfort. The pain is diffuse across his entire abdomen and and waxes and wanes in intensity. He initially had some nausea with this pain but that has improved. He did not take anything for this. No associated fever, chills, cough, CP, SOB, emesis, or urinary changes. No flatus, and his last BM was 2 days ago. He has a history of similar in the past and notes a history of 5 small bowel obstructions. He does have an extensive abdominal surgical history including appendectomy, colostomy with subsequent reversal. Laboratory work up in the ED was essentially unremarkable. CT Abdomen/Pelvis was concerning for small bowel obstruction with transition point in the ileum.   Surgery is consulted by hospitalist physician Dr. Eugenie Norrie, MD in this context for evaluation and management of small bowel obstruction.   PAST MEDICAL HISTORY (PMH):  Past Medical History:  Diagnosis Date   Allergy    enviromental   Anemia 2017   Anxiety    Arthritis    ankylosing spodilitis   Blood transfusion without reported diagnosis    during surgery   Cataract    Depression    Dyspnea    with exertion - had Echo done 09/29/16   History of kidney stones    Hyperlipidemia    Intestinal obstruction ([place])    Pneumonia    as a child     PAST SURGICAL HISTORY ([place]):  Past Surgical History:  Procedure Laterality Date   ABDOMINAL SURGERY     had abcess   APPENDECTOMY     CHOLECYSTECTOMY     with lysis of adhesions   COLONOSCOPY     COLOSTOMY     COLOSTOMY REVERSAL     EYE SURGERY Left    scar tissue removed from cornea   EYE SURGERY Right    cataract surgery with lens implant   JOINT REPLACEMENT Right    hip  x 2 1999 and 2007   SHOULDER ARTHROSCOPY WITH  ROTATOR CUFF REPAIR AND SUBACROMIAL DECOMPRESSION Left 03/02/2013   Procedure: LEFT SHOULDER ARTHROSCOPY WITH SUBACROMIAL DECOMPRESSION DISTAL CALVICLE RESECTION AND ROTATOR CUFF REPAIR ;  Surgeon: [name], MD;  Location: [place];  Service: Orthopedics;  Laterality: Left;   TOTAL HIP REVISION Right 11/06/2016   Procedure: TOTAL HIP REVISION OF THE ACETABULAR COMPONENT;  Surgeon: [name], MD;  [place];  Service: Orthopedics;  Laterality: Right;     MEDICATIONS:  Prior to Admission medications   Medication Sig Start Date End Date Taking? Authorizing Provider  acetaminophen (TYLENOL) 500 MG tablet Take by mouth.    [provider]  Artificial Tear Solution (SOOTHE XP) SOLN Place 1 drop into both eyes 2 (two) times daily.    [provider]  B Complex-C (B-COMPLEX WITH VITAMIN C) tablet Take by mouth.    [provider]  Biotin 5 MG CAPS Take 1 capsule by mouth daily.    [provider]  Calcium Carbonate-Vitamin D 600-400 MG-UNIT tablet Take 1 tablet by mouth 2 (two) times daily.    [provider]  cholecalciferol (VITAMIN D) 1000 units tablet Take 1,000 Units by mouth daily.    [provider]  citalopram (CELEXA) 40 MG tablet Take 40 mg by mouth daily.    [provider]  ferrous sulfate 325 (65 FE) MG tablet Take 1 tablet (325 mg total) by mouth daily with breakfast. Please take with a source of Vitamin C (such as orange juice) to promote absorption. 06/30/19   Orson Slick, MD  folic acid (FOLVITE) 1 MG tablet Take 1 mg by mouth daily.     [provider]  ipratropium (ATROVENT) 0.06 % nasal spray Place 2 sprays into both nostrils 2 (two) times daily. 06/05/19   [provider]  methotrexate (RHEUMATREX) 2.5 MG tablet Take 25 mg by mouth once a week. Sunday 09/15/16   [provider]  rosuvastatin (CRESTOR) 10 MG tablet Take 10 mg by mouth daily. 08/16/19   [provider]      ALLERGIES:  Allergies  Allergen Reactions   Other Hives   Indomethacin Hives   Lactose Intolerance (Gi)     GI UPSET     SOCIAL HISTORY:  Social History   Socioeconomic History   Marital status: Married    Spouse name: Not on file   Number of children: 2   Years of education: Not on file   Highest education level: Not on file  Occupational History   Occupation: retired  Tobacco Use   Smoking status: Former Smoker   Smokeless tobacco: Never Used  Scientific laboratory technician Use: Never used  Substance and Sexual Activity   Alcohol use: Yes    Comment: 3 beers or glasses of wine a day--reports stopped ETOH 11/01/16    Drug use: No   Sexual activity: Never  Other Topics Concern   Not on file  Social History Narrative   Not on file   Social Determinants of Health   Financial Resource Strain: Low Risk    Difficulty of Paying Living Expenses: Not hard at all  Food Insecurity: No Food Insecurity   Worried About Charity fundraiser in the Last Year: Never true   [place] in the Last Year: Never true  Transportation Needs: No Transportation Needs   Lack of Transportation (Medical): No   Lack of Transportation (Non-Medical): No  Physical Activity: Insufficiently Active   Days of Exercise per Week: 5 days   Minutes of Exercise per Session: 10 min  Stress: No Stress Concern Present   Feeling of Stress : Not at all  Social Connections: Unknown   Frequency of Communication with Friends and Family: Patient refused   Frequency of Social Gatherings with Friends and Family: Patient refused   Attends Religious Services: Patient refused   Printmaker: Patient refused   Attends Music therapist: Patient refused   Marital Status: Patient refused  Human resources officer Violence: Not At Risk   Fear of Current or Ex-Partner: No   Emotionally Abused: No   Physically Abused: No   Sexually Abused: No     FAMILY HISTORY:  Family History   Problem Relation Age of Onset   Heart attack Mother    Diabetes Mother    Breast cancer Mother    Heart attack Maternal Grandfather    Pancreatic cancer Brother    Colon cancer Neg Hx    Esophageal cancer Neg Hx    Stomach cancer Neg Hx       REVIEW OF SYSTEMS:  Review of Systems  Constitutional: Negative for chills and fever.  HENT: Negative for congestion and sore throat.   Respiratory: Negative for cough and shortness of breath.   Cardiovascular: Negative for chest  pain and palpitations.  Gastrointestinal: Positive for abdominal pain and nausea. Negative for constipation, diarrhea and vomiting.  Genitourinary: Negative for dysuria and urgency.  All other systems reviewed and are negative.   VITAL SIGNS:  Temp:  [98 F (36.7 C)-99 F (37.2 C)] 98 F (36.7 C) (07/22 0336) Pulse Rate:  [68-78] 72 (07/22 0336) Resp:  [16-20] 18 (07/22 0336) BP: (145-175)/(60-98) 153/88 (07/22 0336) SpO2:  [96 %-100 %] 100 % (07/22 0336) Weight:  [64.9 kg] 64.9 kg (07/21 1909)     Height: 5\' 6"  (167.6 cm) Weight: 64.9 kg BMI (Calculated): 23.09   INTAKE/OUTPUT:  No intake/output data recorded.  PHYSICAL EXAM:  Physical Exam Vitals and nursing note reviewed.  Constitutional:      General: He is not in acute distress.    Appearance: He is well-developed and normal weight. He is not ill-appearing.  HENT:     Head: Normocephalic and atraumatic.     Mouth/Throat:     Comments: NGT in Place  Eyes:     General: No scleral icterus.    Extraocular Movements: Extraocular movements intact.  Cardiovascular:     Rate and Rhythm: Normal rate and regular rhythm.     Heart sounds: Normal heart sounds. No murmur heard.   Pulmonary:     Effort: Pulmonary effort is normal. No respiratory distress.     Breath sounds: Normal breath sounds.  Abdominal:     General: A surgical scar is present. There is distension.     Palpations: Abdomen is soft.     Tenderness: There is abdominal tenderness  (Mild) in the right lower quadrant. There is no guarding or rebound.     Comments: Abdomen is soft, mildly distended, tympanic to percussion, no rebound/guarding. Multiple surgical scars present  Genitourinary:    Comments: Deferred Skin:    General: Skin is warm and dry.     Coloration: Skin is not jaundiced or pale.  Neurological:     General: No focal deficit present.     Mental Status: He is alert and oriented to person, place, and time.  Psychiatric:        Mood and Affect: Mood normal.        Behavior: Behavior normal.      Labs:  CBC Latest Ref Rng & Units 11/02/2019 11/01/2019 09/14/2019  WBC 4.0 - 10.5 K/uL 9.5 9.2 6.2  Hemoglobin 13.0 - 17.0 g/dL 12.4(L) 12.6(L) 10.9(L)  Hematocrit 39 - 52 % 37.8(L) 38.4(L) 33.3(L)  Platelets 150 - 400 K/uL 284 299 209   CMP Latest Ref Rng & Units 11/02/2019 11/01/2019 09/14/2019  Glucose 70 - 99 mg/dL 145(H) 115(H) 117(H)  BUN 8 - 23 mg/dL 13 13 15   Creatinine 0.61 - 1.24 mg/dL 0.86 0.83 0.84  Sodium 135 - 145 mmol/L 137 137 139  Potassium 3.5 - 5.1 mmol/L 4.4 4.2 4.1  Chloride 98 - 111 mmol/L 99 100 104  CO2 22 - 32 mmol/L 31 26 28   Calcium 8.9 - 10.3 mg/dL 9.0 9.3 8.9  Total Protein 6.5 - 8.1 g/dL - 7.6 6.6  Total Bilirubin 0.3 - 1.2 mg/dL - 1.1 0.7  Alkaline Phos 38 - 126 U/L - 52 45  AST 15 - 41 U/L - 27 19  ALT 0 - 44 U/L - 23 17     Imaging studies:   CT Abdomen/Pelvis (11/02/2019) personally reviewed with markedly distended bowel concerning for SBO, and radiologist report reviewed:  IMPRESSION: 1. Small-bowel obstruction, with transition point within the  ileum in the lower abdomen/pelvis. 2. Bilateral nonobstructing renal calculi. 3. Stable enlargement of the prostate. 4. Aortic Atherosclerosis (ICD10-I70.0).   KUB (11/02/2019) personally reviewed showing less small bowel dilation, NGT in place, and radiologist report reviewed:  IMPRESSION: 1. Enteric tube terminates within the gastric body. 2. Nonobstructive bowel gas  pattern. 3. Bilateral nephrolithiasis. 4. Ankylosing spondylitis.   Assessment/Plan: (ICD-10's: K46.609) 85 y.o. male with abdominal pain and nausea concerning for SBO most likely secondary to post-surgical disease given his extensive surgical history, complicated by multiple pertinent comorbidities.   - Appreciate medicine admission   - Remain NPO   - Continue NGT decompression to LIS; monitor and record output  - Continue IVF resuscitation  - Monitor abdominal examination +/- serial KUBs; on-going bowel function  - pain control prn (minimize narcotics); antiemetics prn  - mobilization encouraged  - No emergent surgical intervention; he understands that if conservative management were to fail then we would need to consider laparotomy.    - Further management per primary team; we will follow   All of the above findings and recommendations were discussed with the patient, and all of patient's questions were answered to his expressed satisfaction.  Thank you for the opportunity to participate in this patient's care.   -- Edison Simon, PA-C [facility] 11/02/2019, 7:13 AM [phone] M-F: 7am - 4pm  I saw and evaluated the patient.  I agree with the above documentation, exam, and plan, which I have edited where appropriate. Fredirick Maudlin  12:41 PM

## 2019-11-02 NOTE — Progress Notes (Signed)
PHARMACIST - PHYSICIAN ORDER COMMUNICATION  CONCERNING: P&T Medication Policy on Herbal Medications  DESCRIPTION:  This patient's order for:  Biotin has been noted.  This product(s) is classified as an "herbal" or natural product. Due to a lack of definitive safety studies or FDA approval, nonstandard manufacturing practices, plus the potential risk of unknown drug-drug interactions while on inpatient medications, the Pharmacy and Therapeutics Committee does not permit the use of "herbal" or natural products of this type within [facility].   ACTION TAKEN: The pharmacy department is unable to verify this order at this time and your patient has been informed of this safety policy. Please reevaluate patient's clinical condition at discharge and address if the herbal or natural product(s) should be resumed at that time.   Lu Duffel, PharmD, BCPS Clinical Pharmacist 11/02/2019 7:34 AM

## 2019-11-02 NOTE — H&P (Signed)
[place] [facility] [place] NAME: Steven [name]    MR#:  [id number]  DATE OF BIRTH:  [birth year]  DATE OF ADMISSION:  11/01/2019  PRIMARY CARE PHYSICIAN: [name], MD   REQUESTING/REFERRING PHYSICIAN: Marjean Donna, MD.  CHIEF COMPLAINT:   Chief Complaint  Patient presents with  . Abdominal Pain    HISTORY OF PRESENT ILLNESS:  Steven [name]  is a 85 y.o. male with a known history of ankylosing spondylitis, dyslipidemia, depression, and recurrent small bowel obstruction likely due to adhesions, who presented to the emergency room with acute onset of lower abdominal pain which has been worsening since yesterday without nausea or vomiting or fever or chills.  He denies any cough or wheezing or dyspnea or palpitations.  No chest pain.  No dysuria, oliguria or hematuria or flank pain.  Upon presentation to the emergency room,LabetalolLabetalol blood pressure was 154/98 with otherwise normal vital signs.  Labs revealed mild anemia.  COVID-19 PCR came back negative CT of the abdomen and pelvis showed small bowel obstruction with transition point within the ileum in the lower abdomen/pelvis, bilateral nonobstructing renal calculi and stable enlargement of the prostate as well as aortic atherosclerosis.  Abdominal x-ray prior to that showed no evidence of bowel obstruction and bilateral renal calculi and ankylosing spondylitis.  The patient was given 1 L bolus of IV normal saline for milligram of IV Zofran.  He had an NG tube placed.  He will be admitted to a medical monitored bed for further evaluation and management.  PAST MEDICAL HISTORY:   Past Medical History:  Diagnosis Date  . Allergy    enviromental  . Anemia 2017  . Anxiety   . Arthritis    ankylosing spodilitis  . Blood transfusion without reported diagnosis    during surgery  . Cataract   . Depression   . Dyspnea    with exertion - had Echo done 09/29/16  . History of kidney stones   .  Hyperlipidemia   . Intestinal obstruction ([place])   . Pneumonia    as a child    PAST SURGICAL HISTORY:   Past Surgical History:  Procedure Laterality Date  . ABDOMINAL SURGERY     had abcess  . APPENDECTOMY    . CHOLECYSTECTOMY     with lysis of adhesions  . COLONOSCOPY    . COLOSTOMY    . COLOSTOMY REVERSAL    . EYE SURGERY Left    scar tissue removed from cornea  . EYE SURGERY Right    cataract surgery with lens implant  . JOINT REPLACEMENT Right    hip  x 2 1999 and 2007  . SHOULDER ARTHROSCOPY WITH ROTATOR CUFF REPAIR AND SUBACROMIAL DECOMPRESSION Left 03/02/2013   Procedure: LEFT SHOULDER ARTHROSCOPY WITH SUBACROMIAL DECOMPRESSION DISTAL CALVICLE RESECTION AND ROTATOR CUFF REPAIR ;  Surgeon: [name], MD;  Location: [place];  Service: Orthopedics;  Laterality: Left;  . TOTAL HIP REVISION Right 11/06/2016   Procedure: TOTAL HIP REVISION OF THE ACETABULAR COMPONENT;  Surgeon: [name], MD;  Location: [place];  Service: Orthopedics;  Laterality: Right;    SOCIAL HISTORY:   Social History   Tobacco Use  . Smoking status: Former Research scientist (life sciences)  . Smokeless tobacco: Never Used  Substance Use Topics  . Alcohol use: Yes    Comment: 3 beers or glasses of wine a day--reports stopped ETOH 11/01/16     FAMILY HISTORY:   Family History  Problem Relation  Age of Onset  . Heart attack Mother   . Diabetes Mother   . Breast cancer Mother   . Heart attack Maternal Grandfather   . Pancreatic cancer Brother   . Colon cancer Neg Hx   . Esophageal cancer Neg Hx   . Stomach cancer Neg Hx     DRUG ALLERGIES:   Allergies  Allergen Reactions  . Other Hives  . Indomethacin Hives  . Lactose Intolerance (Gi)     GI UPSET    REVIEW OF SYSTEMS:   ROS As per history of present illness. All pertinent systems were reviewed above. Constitutional, HEENT, cardiovascular, respiratory, GI, GU, musculoskeletal, neuro, psychiatric, endocrine, integumentary and hematologic systems were  reviewed and are otherwise negative/unremarkable except for positive findings mentioned above in the HPI.   MEDICATIONS AT HOME:   Prior to Admission medications   Medication Sig Start Date End Date Taking? Authorizing Provider  acetaminophen (TYLENOL) 500 MG tablet Take by mouth.    [provider]  Artificial Tear Solution (SOOTHE XP) SOLN Place 1 drop into both eyes 2 (two) times daily.    [provider]  B Complex-C (B-COMPLEX WITH VITAMIN C) tablet Take by mouth.    [provider]  Biotin 5 MG CAPS Take 1 capsule by mouth daily.    [provider]  Calcium Carbonate-Vitamin D 600-400 MG-UNIT tablet Take 1 tablet by mouth 2 (two) times daily.    [provider]  cholecalciferol (VITAMIN D) 1000 units tablet Take 1,000 Units by mouth daily.    [provider]  citalopram (CELEXA) 40 MG tablet Take 40 mg by mouth daily.    [provider]  ferrous sulfate 325 (65 FE) MG tablet Take 1 tablet (325 mg total) by mouth daily with breakfast. Please take with a source of Vitamin C (such as orange juice) to promote absorption. 06/30/19   Orson Slick, MD  folic acid (FOLVITE) 1 MG tablet Take 1 mg by mouth daily.     [provider]  ipratropium (ATROVENT) 0.06 % nasal spray Place 2 sprays into both nostrils 2 (two) times daily. 06/05/19   [provider]  methotrexate (RHEUMATREX) 2.5 MG tablet Take 25 mg by mouth once a week. Sunday 09/15/16   [provider]  rosuvastatin (CRESTOR) 10 MG tablet Take 10 mg by mouth daily. 08/16/19   [provider]      VITAL SIGNS:  Blood pressure (!) 167/60, pulse 78, temperature 99 F (37.2 C), temperature source Oral, resp. rate 20, height 5\' 6"  (1.676 m), weight 64.9 kg, SpO2 98 %.  PHYSICAL EXAMINATION:  Physical Exam  GENERAL:  85 y.o.-year-old Caucasian male patient lying in the bed with no acute distress.  EYES: Pupils equal, round, reactive to light  and accommodation. No scleral icterus. Extraocular muscles intact.  HEENT: Head atraumatic, normocephalic. Oropharynx and nasopharynx clear.  NG tube in place with clear liquids. NECK:  Supple, no jugular venous distention. No thyroid enlargement, no tenderness.  LUNGS: Normal breath sounds bilaterally, no wheezing, rales,rhonchi or crepitation. No use of accessory muscles of respiration.  CARDIOVASCULAR: Regular rate and rhythm, S1, S2 normal. No murmurs, rubs, or gallops.  ABDOMEN: Soft, distended with generalized tenderness without rebound tenderness guarding or rigidity.  He had significant diminished bowel sounds present. No organomegaly or mass.  EXTREMITIES: No pedal edema, cyanosis, or clubbing.  NEUROLOGIC: Cranial nerves II through XII are intact. Muscle strength 5/5 in all extremities. Sensation intact. Gait not  checked.  PSYCHIATRIC: The patient is alert and oriented x 3.  Normal affect and good eye contact. SKIN: No obvious rash, lesion, or ulcer.   LABORATORY PANEL:   CBC Recent Labs  Lab 11/01/19 1910  WBC 9.2  HGB 12.6*  HCT 38.4*  PLT 299   ------------------------------------------------------------------------------------------------------------------  Chemistries  Recent Labs  Lab 11/01/19 1910  NA 137  K 4.2  CL 100  CO2 26  GLUCOSE 115*  BUN 13  CREATININE 0.83  CALCIUM 9.3  AST 27  ALT 23  ALKPHOS 52  BILITOT 1.1   ------------------------------------------------------------------------------------------------------------------  Cardiac Enzymes No results for input(s): TROPONINI in the last 168 hours. ------------------------------------------------------------------------------------------------------------------  RADIOLOGY:  DG Abdomen 1 View  Result Date: 11/01/2019 CLINICAL DATA:  85 year old male with abdominal pain. EXAM: ABDOMEN - 1 VIEW COMPARISON:  Abdominal radiograph dated 03/10/2019 and CT dated 03/09/2019 FINDINGS: There is no  bowel dilatation or evidence of obstruction. Postsurgical changes of the bowel with anastomotic suture in the left lower quadrant. No free air identified. Right upper quadrant cholecystectomy clips. Several small bilateral renal calculi noted. There is osteopenia with degenerative changes and findings of ankylosing spondylitis. Right hip arthroplasty. No acute osseous pathology. IMPRESSION: 1. No evidence of bowel obstruction. 2. Bilateral renal calculi. 3. Ankylosing spondylitis. Electronically Signed   By: Anner Crete M.D.   On: 11/01/2019 19:24   CT ABDOMEN PELVIS W CONTRAST  Result Date: 11/02/2019 CLINICAL DATA:  Generalized abdominal pain, previous partial colectomy and history of bowel obstruction EXAM: CT ABDOMEN AND PELVIS WITH CONTRAST TECHNIQUE: Multidetector CT imaging of the abdomen and pelvis was performed using the standard protocol following bolus administration of intravenous contrast. CONTRAST:  114mL OMNIPAQUE IOHEXOL 300 MG/ML  SOLN COMPARISON:  03/09/2019 FINDINGS: Lower chest: No acute pleural or parenchymal lung disease. Hepatobiliary: No focal liver abnormality is seen. Status post cholecystectomy. No biliary dilatation. Pancreas: Unremarkable. No pancreatic ductal dilatation or surrounding inflammatory changes. Spleen: Normal in size without focal abnormality. Adrenals/Urinary Tract: There are bilateral nonobstructing renal calculi, not appreciably changed since prior study. Largest calculus on the right measures 5 mm. Scattered areas of bilateral renal cortical scarring are again noted unchanged. The adrenals are normal. Bladder is minimally distended with no focal abnormality. Stomach/Bowel: Previous sigmoid colon resection and reanastomosis. Prior partial small-bowel resection. There are multiple dilated loops of small bowel, with decompression of the terminal ileum. Small bowel measures up to 3 cm in diameter, compatible with small-bowel obstruction. No bowel wall thickening or  inflammatory change. Vascular/Lymphatic: Aortic atherosclerosis. No enlarged abdominal or pelvic lymph nodes. Reproductive: Stable enlargement of the prostate. Other: No free fluid or free gas.  No abdominal wall hernia. Musculoskeletal: No acute or destructive bony lesions. Stable right hip osteoarthritis. Reconstructed images demonstrate ankylosing spondylitis, stable. IMPRESSION: 1. Small-bowel obstruction, with transition point within the ileum in the lower abdomen/pelvis. 2. Bilateral nonobstructing renal calculi. 3. Stable enlargement of the prostate. 4. Aortic Atherosclerosis (ICD10-I70.0). Electronically Signed   By: Randa Ngo M.D.   On: 11/02/2019 00:32      IMPRESSION AND PLAN:   1.  Recurrent small bowel obstruction with  transition point in the ileum .  This like secondary to adhesions from previous laparotomies. -The patient will be admitted to a medically monitored bed. -He will be kept n.p.o. -He will be hydrated with IV normal saline. -We will optimize electrolytes. -A general surgery consult will be obtained. -I notified Dr. Celine Ahr about the patient. -We will follow two-view abdomen x-ray in a.m.  2.  Depression. -We will continue his antidepressants.  3.  Dyslipidemia. -We will continue statin therapy.  4.  Ankylosing spondylitis. -We will continue his methotrexate.  5.  DVT. -Subcutaneous Lovenox   All the records are reviewed and case discussed with ED provider. The plan of care was discussed in details with the patient (and family). I answered all questions. The patient agreed to proceed with the above mentioned plan. Further management will depend upon hospital course.   CODE STATUS: Full code  Status is: Inpatient  Remains inpatient appropriate because:Ongoing active pain requiring inpatient pain management, Ongoing diagnostic testing needed not appropriate for outpatient work up, Unsafe d/c plan, IV treatments appropriate due to intensity of illness  or inability to take PO and Inpatient level of care appropriate due to severity of illness   Dispo: The patient is from: Home              Anticipated d/c is to: Home              Anticipated d/c date is: 3 days              Patient currently is not medically stable to d/c.   TOTAL TIME TAKING CARE OF THIS PATIENT: 55 minutes.    Christel Mormon M.D on 11/02/2019 at 2:23 AM  [facility]   From 7 PM-7 AM, contact night-coverage [email or web address]  CC: Primary care physician; [name], MD   Note: This dictation was prepared with Dragon dictation along with smaller phrase technology. Any transcriptional typo errors that result from this process are unintentional.

## 2019-11-02 NOTE — ED Notes (Signed)
Pt tolerated NG tube placement. Pt shoes removed per request. Lights out. Heat on. BP cuff repositioned. Pt given urinal.

## 2019-11-02 NOTE — Progress Notes (Signed)
Patient ID: Steven [name], male   DOB: [birth year], 85 y.o.   MRN: [id number] [facility] PROGRESS NOTE  Steven [name] KYH:[id number] DOB: Apr 21, 1934 DOA: 11/01/2019 PCP: Steven Pretty, MD  HPI/Subjective: Patient coming in with nausea vomiting and has recurrent small bowel obstruction.  Patient states that this is happened 5 times before and has resolved on its own.  Has had multiple abdominal surgeries in the past.  No abdominal pain currently.  With the NG tube he is not having any nausea or vomiting.  No passing gas as of this morning.  Objective: Vitals:   11/02/19 0336 11/02/19 1208  BP: (!) 153/88 (!) 135/66  Pulse: 72 72  Resp: 18   Temp: 98 F (36.7 C) 98.5 F (36.9 C)  SpO2: 100% 98%    Intake/Output Summary (Last 24 hours) at 11/02/2019 1342 Last data filed at 11/02/2019 0700 Gross per 24 hour  Intake 0 ml  Output 1000 ml  Net -1000 ml   Filed Weights   11/01/19 1909  Weight: 64.9 kg    ROS: Review of Systems  Respiratory: Negative for shortness of breath.   Cardiovascular: Negative for chest pain.  Gastrointestinal: Negative for abdominal pain, constipation, diarrhea, nausea and vomiting.   Exam: Physical Exam HENT:     Nose: No mucosal edema.     Mouth/Throat:     Pharynx: No oropharyngeal exudate.  Eyes:     General: Lids are normal.     Conjunctiva/sclera: Conjunctivae normal.     Pupils: Pupils are equal, round, and reactive to light.  Neck:     Thyroid: No thyroid mass or thyromegaly.     Vascular: No carotid bruit or JVD.  Cardiovascular:     Rate and Rhythm: Normal rate and regular rhythm.     Heart sounds: Normal heart sounds, S1 normal and S2 normal.  Pulmonary:     Effort: No respiratory distress.     Breath sounds: No decreased breath sounds, wheezing, rhonchi or rales.  Abdominal:     General: Bowel sounds are decreased.     Palpations: Abdomen is soft.     Tenderness: There is no abdominal tenderness.  Musculoskeletal:      Cervical back: No edema.     Right ankle: No swelling.     Left ankle: No swelling.  Lymphadenopathy:     Cervical: No cervical adenopathy.  Skin:    General: Skin is warm.     Findings: No rash.  Neurological:     Mental Status: He is alert and oriented to person, place, and time.       Data Reviewed: Basic Metabolic Panel: Recent Labs  Lab 11/01/19 1910 11/02/19 0503  NA 137 137  K 4.2 4.4  CL 100 99  CO2 26 31  GLUCOSE 115* 145*  BUN 13 13  CREATININE 0.83 0.86  CALCIUM 9.3 9.0   Liver Function Tests: Recent Labs  Lab 11/01/19 1910  AST 27  ALT 23  ALKPHOS 52  BILITOT 1.1  PROT 7.6  ALBUMIN 4.4   Recent Labs  Lab 11/01/19 1910  LIPASE 34   CBC: Recent Labs  Lab 11/01/19 1910 11/02/19 0503  WBC 9.2 9.5  HGB 12.6* 12.4*  HCT 38.4* 37.8*  MCV 97.2 98.2  PLT 299 284     Recent Results (from the past 240 hour(s))  SARS Coronavirus 2 by RT PCR (hospital order, performed in [facility] hospital lab) Nasopharyngeal Nasopharyngeal Swab     Status: None  Collection Time: 11/02/19  2:48 AM   Specimen: Nasopharyngeal Swab  Result Value Ref Range Status   SARS Coronavirus 2 NEGATIVE NEGATIVE Final    Comment: (NOTE) SARS-CoV-2 target nucleic acids are NOT DETECTED.  The SARS-CoV-2 RNA is generally detectable in upper and lower respiratory specimens during the acute phase of infection. The lowest concentration of SARS-CoV-2 viral copies this assay can detect is 250 copies / mL. A negative result does not preclude SARS-CoV-2 infection and should not be used as the sole basis for treatment or other patient management decisions.  A negative result may occur with improper specimen collection / handling, submission of specimen other than nasopharyngeal swab, presence of viral mutation(s) within the areas targeted by this assay, and inadequate number of viral copies (<250 copies / mL). A negative result must be combined with clinical observations, patient  history, and epidemiological information.  Fact Sheet for Patients:   StrictlyIdeas.no  Fact Sheet for Healthcare Providers: BankingDealers.co.za  This test is not yet approved or  cleared by the [place] FDA and has been authorized for detection and/or diagnosis of SARS-CoV-2 by FDA under an Emergency Use Authorization (EUA).  This EUA will remain in effect (meaning this test can be used) for the duration of the COVID-19 declaration under Section 564(b)(1) of the Act, 21 U.S.C. section 360bbb-3(b)(1), unless the authorization is terminated or revoked sooner.  Performed at [facility], [address] [place], [place] [address]      Studies: DG Abdomen 1 View  Result Date: 11/01/2019 CLINICAL DATA:  85 year old male with abdominal pain. EXAM: ABDOMEN - 1 VIEW COMPARISON:  Abdominal radiograph dated 03/10/2019 and CT dated 03/09/2019 FINDINGS: There is no bowel dilatation or evidence of obstruction. Postsurgical changes of the bowel with anastomotic suture in the left lower quadrant. No free air identified. Right upper quadrant cholecystectomy clips. Several small bilateral renal calculi noted. There is osteopenia with degenerative changes and findings of ankylosing spondylitis. Right hip arthroplasty. No acute osseous pathology. IMPRESSION: 1. No evidence of bowel obstruction. 2. Bilateral renal calculi. 3. Ankylosing spondylitis. Electronically Signed   By: Anner Crete M.D.   On: 11/01/2019 19:24   CT ABDOMEN PELVIS W CONTRAST  Result Date: 11/02/2019 CLINICAL DATA:  Generalized abdominal pain, previous partial colectomy and history of bowel obstruction EXAM: CT ABDOMEN AND PELVIS WITH CONTRAST TECHNIQUE: Multidetector CT imaging of the abdomen and pelvis was performed using the standard protocol following bolus administration of intravenous contrast. CONTRAST:  175mL OMNIPAQUE IOHEXOL 300 MG/ML  SOLN COMPARISON:   03/09/2019 FINDINGS: Lower chest: No acute pleural or parenchymal lung disease. Hepatobiliary: No focal liver abnormality is seen. Status post cholecystectomy. No biliary dilatation. Pancreas: Unremarkable. No pancreatic ductal dilatation or surrounding inflammatory changes. Spleen: Normal in size without focal abnormality. Adrenals/Urinary Tract: There are bilateral nonobstructing renal calculi, not appreciably changed since prior study. Largest calculus on the right measures 5 mm. Scattered areas of bilateral renal cortical scarring are again noted unchanged. The adrenals are normal. Bladder is minimally distended with no focal abnormality. Stomach/Bowel: Previous sigmoid colon resection and reanastomosis. Prior partial small-bowel resection. There are multiple dilated loops of small bowel, with decompression of the terminal ileum. Small bowel measures up to 3 cm in diameter, compatible with small-bowel obstruction. No bowel wall thickening or inflammatory change. Vascular/Lymphatic: Aortic atherosclerosis. No enlarged abdominal or pelvic lymph nodes. Reproductive: Stable enlargement of the prostate. Other: No free fluid or free gas.  No abdominal wall hernia. Musculoskeletal: No acute or destructive  bony lesions. Stable right hip osteoarthritis. Reconstructed images demonstrate ankylosing spondylitis, stable. IMPRESSION: 1. Small-bowel obstruction, with transition point within the ileum in the lower abdomen/pelvis. 2. Bilateral nonobstructing renal calculi. 3. Stable enlargement of the prostate. 4. Aortic Atherosclerosis (ICD10-I70.0). Electronically Signed   By: Randa Ngo M.D.   On: 11/02/2019 00:32   DG Abd 2 Views  Result Date: 11/02/2019 CLINICAL DATA:  Lower abdominal pain. History of ankylosing spondylitis EXAM: ABDOMEN - 2 VIEW COMPARISON:  11/01/2019 FINDINGS: Enteric tube terminates within the gastric body. No dilated loops of small bowel are identified. No free intraperitoneal air.  Calcifications projecting over the bilateral kidneys compatible with known bilateral nephrolithiasis. Stigmata of ankylosing spondylitis. Right total hip arthroplasty. Aortic atherosclerosis. IMPRESSION: 1. Enteric tube terminates within the gastric body. 2. Nonobstructive bowel gas pattern. 3. Bilateral nephrolithiasis. 4. Ankylosing spondylitis. Electronically Signed   By: Davina Poke D.O.   On: 11/02/2019 08:16    Scheduled Meds: . B-complex with vitamin C  1 tablet Oral Daily  . calcium-vitamin D  1 tablet Oral BID  . cholecalciferol  1,000 Units Oral Daily  . citalopram  40 mg Oral Daily  . enoxaparin (LOVENOX) injection  40 mg Subcutaneous Q24H  . ferrous sulfate  325 mg Oral Q breakfast  . folic acid  1 mg Oral Daily  . ipratropium  2 spray Each Nare BID  . methotrexate  25 mg Oral Weekly  . polyvinyl alcohol  1 drop Both Eyes BID  . rosuvastatin  10 mg Oral Daily   Continuous Infusions: . sodium chloride 100 mL/hr at 11/02/19 0349    Assessment/Plan:  1. Small bowel obstruction.  Patient has NG tube.  N.p.o.  IV fluid hydration.  Appreciate general surgery consultation.  Since not passing gas at this point we will continue conservative management. 2. Ankylosing spondylitis on methotrexate and folic acid 3. Hyperlipidemia on Crestor 4. Depression on Celexa 5.   Pharmacist to go over med rec with patient to make sure her         meds and dosages are correct or not.   Code Status:     Code Status Orders  (From admission, onward)         Start     Ordered   11/02/19 0124  Full code  Continuous        11/02/19 0130        Code Status History    Date Active Date Inactive Code Status Order ID Comments User Context   03/09/2019 0739 03/12/2019 2148 Full Code [id number]  Damita Lack, MD ED   11/06/2016 1539 11/08/2016 2018 Full Code [id number]  N[facility] Inpatient   03/21/2015 1052 03/27/2015 1554 Full Code [id number]  [facility] ED    04/10/2014 0647 04/12/2014 1747 Full Code [id number]  Rise Patience, MD Inpatient   Advance Care Planning Activity    Advance Directive Documentation     Most Recent Value  Type of Advance Directive Healthcare Power of [place], Living will  Pre-existing out of facility DNR order (yellow form or pink MOST form) --  "MOST" Form in Place? --     Family Communication: Deferred me calling family Disposition Plan: Status is: Inpatient  Dispo: The patient is from: Home              Anticipated d/c is to: Home              Anticipated d/c date is: Small bowel  obstructions sometimes take up to 5 days or longer to improve              Patient currently being treated for small bowel obstruction  Consultants:  General surgery  Time spent: 27 minutes  [place]

## 2019-11-03 ENCOUNTER — Encounter: Payer: Self-pay | Admitting: Family Medicine

## 2019-11-03 DIAGNOSIS — E876 Hypokalemia: Secondary | ICD-10-CM

## 2019-11-03 LAB — BASIC METABOLIC PANEL
Anion gap: 4 — ABNORMAL LOW (ref 5–15)
BUN: 9 mg/dL (ref 8–23)
CO2: 32 mmol/L (ref 22–32)
Calcium: 7.9 mg/dL — ABNORMAL LOW (ref 8.9–10.3)
Chloride: 101 mmol/L (ref 98–111)
Creatinine, Ser: 0.9 mg/dL (ref 0.61–1.24)
GFR calc Af Amer: >60 mL/min (ref >60)
GFR calc non Af Amer: >60 mL/min (ref >60)
Glucose, Bld: 113 mg/dL — ABNORMAL HIGH (ref 70–99)
Potassium: 3.6 mmol/L (ref 3.5–5.1)
Sodium: 137 mmol/L (ref 135–145)

## 2019-11-03 LAB — MAGNESIUM: Magnesium: 1.6 mg/dL — ABNORMAL LOW (ref 1.7–2.4)

## 2019-11-03 MED ORDER — POTASSIUM CHLORIDE 2 MEQ/ML IV SOLN
INTRAVENOUS | Status: DC
  Filled 2019-11-03 (×6): qty 1000

## 2019-11-03 MED ORDER — OXYMETAZOLINE HCL 0.05 % NA SOLN
1.0000 | Freq: Two times a day (BID) | NASAL | Status: DC
  Filled 2019-11-03: qty 15

## 2019-11-03 MED ORDER — MAGNESIUM SULFATE 2 GM/50ML IV SOLN
2.0000 g | Freq: Once | INTRAVENOUS | Status: AC
  Administered 2019-11-03: 2 g via INTRAVENOUS
  Filled 2019-11-03: qty 50

## 2019-11-03 NOTE — Progress Notes (Addendum)
[facility] SURGICAL PROGRESS NOTE (cpt [phone])  Hospital Day(s): 1.   Interval History: Patient seen and examined, no acute events or new complaints overnight. Patient reports he is overall feeling better but still having some lower abdominal soreness, denies fever, chills, nausea, or emesis. Labs are reassuring aside from mild hypomagnesemia to 1.6. His NGT output has started to decrease. He had 1100 ccs in last 24 hours but only 300 ccs overnight. He has started to pass flatus, but not overly consistent, and had a BM yesterday. Has remained NPO. No issues with ambulation.   Review of Systems:  Constitutional: denies fever, chills  HEENT: denies cough or congestion  Respiratory: denies any shortness of breath  Cardiovascular: denies chest pain or palpitations  Gastrointestinal: + abdominal pain, denied N/V, or diarrhea/and bowel function as per interval history Genitourinary: denies burning with urination or urinary frequency   Vital signs in last 24 hours: [min-max] current  Temp:  [98.5 F (36.9 C)-99.5 F (37.5 C)] 98.9 F (37.2 C) (07/23 0519) Pulse Rate:  [69-72] 70 (07/23 0519) Resp:  [16-18] 16 (07/23 0519) BP: (115-140)/(55-69) 140/69 (07/23 0519) SpO2:  [97 %-98 %] 97 % (07/23 0519)     Height: 5\' 6"  (167.6 cm) Weight: 64.9 kg BMI (Calculated): 23.09   Intake/Output last 2 shifts:  07/22 0701 - 07/23 0700 In: 2309.9 [I.V.:2309.9] Out: 2300 [Urine:1200; Emesis/NG output:1100]   Physical Exam:  Constitutional: alert, cooperative and no distress  HENT: normocephalic without obvious abnormality, NGT in palce Eyes: PERRL, EOM's grossly intact and symmetric  Respiratory: breathing non-labored at rest  Cardiovascular: regular rate and sinus rhythm  Gastrointestinal: Soft, mild lower abdominal soreness, and non-distended, no rebound/guarding, multiple surgical scars present Musculoskeletal: no edema or wounds, motor and sensation grossly intact, NT    Labs:   CBC Latest Ref Rng & Units 11/02/2019 11/01/2019 09/14/2019  WBC 4.0 - 10.5 K/uL 9.5 9.2 6.2  Hemoglobin 13.0 - 17.0 g/dL 12.4(L) 12.6(L) 10.9(L)  Hematocrit 39 - 52 % 37.8(L) 38.4(L) 33.3(L)  Platelets 150 - 400 K/uL 284 299 209   CMP Latest Ref Rng & Units 11/03/2019 11/02/2019 11/01/2019  Glucose 70 - 99 mg/dL 113(H) 145(H) 115(H)  BUN 8 - 23 mg/dL 9 13 13   Creatinine 0.61 - 1.24 mg/dL 0.90 0.86 0.83  Sodium 135 - 145 mmol/L 137 137 137  Potassium 3.5 - 5.1 mmol/L 3.6 4.4 4.2  Chloride 98 - 111 mmol/L 101 99 100  CO2 22 - 32 mmol/L 32 31 26  Calcium 8.9 - 10.3 mg/dL 7.9(L) 9.0 9.3  Total Protein 6.5 - 8.1 g/dL - - 7.6  Total Bilirubin 0.3 - 1.2 mg/dL - - 1.1  Alkaline Phos 38 - 126 U/L - - 52  AST 15 - 41 U/L - - 27  ALT 0 - 44 U/L - - 23     Imaging studies: No new pertinent imaging studies   Assessment/Plan: (ICD-10's: K63.609) 85 y.o. male with clinically improving SBO most likely secondary to post-surgical disease given his extensive surgical history, although NGT output still somewhat high, complicated by multiple pertinent comorbidities.   - Monitor NGT output; may be ready for this this afternoon or tomorrow morning; no rush  - Continue NPO for now  - Continue IVF resuscitation  - Replete Mg2+; monitor electrolytes while NPO             - Monitor abdominal examination +/- serial KUBs; on-going bowel function             -  pain control prn (minimize narcotics); antiemetics prn             - mobilization encouraged             - No emergent surgical intervention; he understands that if conservative management were to fail then we would need to consider laparotomy.               - Further management per primary team; we will follow    All of the above findings and recommendations were discussed with the patient, and all of patient's questions were answered to his expressed satisfaction.  -- Edison Simon, PA-C [facility] 11/03/2019, 8:22  AM [phone] M-F: 7am - 4pm  I saw and evaluated the patient.  I agree with the above documentation, exam, and plan, which I have edited where appropriate. Fredirick Maudlin  11:45 AM

## 2019-11-03 NOTE — Progress Notes (Signed)
Patient ID: Steven [name], male   DOB: 1934/06/04, 85 y.o.   MRN: [id number] [facility] PROGRESS NOTE  Steven [name] LOV:[id number] DOB: 1934-09-21 DOA: 11/01/2019 PCP: [name], MD  HPI/Subjective: Patient feeling little bit better.  Did have diarrhea last night and is passing gas this morning.  Still having quite a bit of output over the last 24 hours from the NG tube.  Also had 100 out of the NG tube this morning.  Slight abdominal discomfort.  Came in with nausea vomiting and found to have small bowel obstruction.  Objective: Vitals:   11/02/19 2012 11/03/19 0519  BP: (!) 115/55 (!) 140/69  Pulse: 69 70  Resp: 18 16  Temp: 99.5 F (37.5 C) 98.9 F (37.2 C)  SpO2: 98% 97%    Intake/Output Summary (Last 24 hours) at 11/03/2019 1153 Last data filed at 11/03/2019 0557 Gross per 24 hour  Intake 2309.92 ml  Output 2300 ml  Net 9.92 ml   Filed Weights   11/01/19 1909  Weight: 64.9 kg    ROS: Review of Systems  Respiratory: Negative for shortness of breath.   Cardiovascular: Negative for chest pain.  Gastrointestinal: Positive for abdominal pain. Negative for nausea and vomiting.   Exam: Physical Exam HENT:     Head: Normocephalic.     Nose: No mucosal edema.     Mouth/Throat:     Pharynx: No oropharyngeal exudate.  Eyes:     General: Lids are normal.     Conjunctiva/sclera: Conjunctivae normal.     Pupils: Pupils are equal, round, and reactive to light.  Cardiovascular:     Rate and Rhythm: Normal rate and regular rhythm.     Heart sounds: Normal heart sounds, S1 normal and S2 normal.  Pulmonary:     Breath sounds: No decreased breath sounds, wheezing, rhonchi or rales.  Abdominal:     General: Bowel sounds are normal.     Palpations: Abdomen is soft.     Tenderness: There is no abdominal tenderness.  Musculoskeletal:     Right ankle: No swelling.     Left ankle: No swelling.  Skin:    General: Skin is warm.     Findings: No rash.  Neurological:      Mental Status: He is alert and oriented to person, place, and time.       Data Reviewed: Basic Metabolic Panel: Recent Labs  Lab 11/01/19 1910 11/02/19 0503 11/03/19 0556  NA 137 137 137  K 4.2 4.4 3.6  CL 100 99 101  CO2 26 31 32  GLUCOSE 115* 145* 113*  BUN 13 13 9   CREATININE 0.83 0.86 0.90  CALCIUM 9.3 9.0 7.9*  MG  --   --  1.6*   Liver Function Tests: Recent Labs  Lab 11/01/19 1910  AST 27  ALT 23  ALKPHOS 52  BILITOT 1.1  PROT 7.6  ALBUMIN 4.4   Recent Labs  Lab 11/01/19 1910  LIPASE 34   CBC: Recent Labs  Lab 11/01/19 1910 11/02/19 0503  WBC 9.2 9.5  HGB 12.6* 12.4*  HCT 38.4* 37.8*  MCV 97.2 98.2  PLT 299 284     Recent Results (from the past 240 hour(s))  SARS Coronavirus 2 by RT PCR (hospital order, performed in [facility] hospital lab) Nasopharyngeal Nasopharyngeal Swab     Status: None   Collection Time: 11/02/19  2:48 AM   Specimen: Nasopharyngeal Swab  Result Value Ref Range Status   SARS Coronavirus 2  NEGATIVE NEGATIVE Final    Comment: (NOTE) SARS-CoV-2 target nucleic acids are NOT DETECTED.  The SARS-CoV-2 RNA is generally detectable in upper and lower respiratory specimens during the acute phase of infection. The lowest concentration of SARS-CoV-2 viral copies this assay can detect is 250 copies / mL. A negative result does not preclude SARS-CoV-2 infection and should not be used as the sole basis for treatment or other patient management decisions.  A negative result may occur with improper specimen collection / handling, submission of specimen other than nasopharyngeal swab, presence of viral mutation(s) within the areas targeted by this assay, and inadequate number of viral copies (<250 copies / mL). A negative result must be combined with clinical observations, patient history, and epidemiological information.  Fact Sheet for Patients:   StrictlyIdeas.no  Fact Sheet for Healthcare  Providers: BankingDealers.co.za  This test is not yet approved or  cleared by the [place] FDA and has been authorized for detection and/or diagnosis of SARS-CoV-2 by FDA under an Emergency Use Authorization (EUA).  This EUA will remain in effect (meaning this test can be used) for the duration of the COVID-19 declaration under Section 564(b)(1) of the Act, 21 U.S.C. section 360bbb-3(b)(1), unless the authorization is terminated or revoked sooner.  Performed at [facility], [address], [place], [place] [address]      Studies: DG Abdomen 1 View  Result Date: 11/01/2019 CLINICAL DATA:  85 year old male with abdominal pain. EXAM: ABDOMEN - 1 VIEW COMPARISON:  Abdominal radiograph dated 03/10/2019 and CT dated 03/09/2019 FINDINGS: There is no bowel dilatation or evidence of obstruction. Postsurgical changes of the bowel with anastomotic suture in the left lower quadrant. No free air identified. Right upper quadrant cholecystectomy clips. Several small bilateral renal calculi noted. There is osteopenia with degenerative changes and findings of ankylosing spondylitis. Right hip arthroplasty. No acute osseous pathology. IMPRESSION: 1. No evidence of bowel obstruction. 2. Bilateral renal calculi. 3. Ankylosing spondylitis. Electronically Signed   By: Anner Crete M.D.   On: 11/01/2019 19:24   CT ABDOMEN PELVIS W CONTRAST  Result Date: 11/02/2019 CLINICAL DATA:  Generalized abdominal pain, previous partial colectomy and history of bowel obstruction EXAM: CT ABDOMEN AND PELVIS WITH CONTRAST TECHNIQUE: Multidetector CT imaging of the abdomen and pelvis was performed using the standard protocol following bolus administration of intravenous contrast. CONTRAST:  140mL OMNIPAQUE IOHEXOL 300 MG/ML  SOLN COMPARISON:  03/09/2019 FINDINGS: Lower chest: No acute pleural or parenchymal lung disease. Hepatobiliary: No focal liver abnormality is seen. Status post  cholecystectomy. No biliary dilatation. Pancreas: Unremarkable. No pancreatic ductal dilatation or surrounding inflammatory changes. Spleen: Normal in size without focal abnormality. Adrenals/Urinary Tract: There are bilateral nonobstructing renal calculi, not appreciably changed since prior study. Largest calculus on the right measures 5 mm. Scattered areas of bilateral renal cortical scarring are again noted unchanged. The adrenals are normal. Bladder is minimally distended with no focal abnormality. Stomach/Bowel: Previous sigmoid colon resection and reanastomosis. Prior partial small-bowel resection. There are multiple dilated loops of small bowel, with decompression of the terminal ileum. Small bowel measures up to 3 cm in diameter, compatible with small-bowel obstruction. No bowel wall thickening or inflammatory change. Vascular/Lymphatic: Aortic atherosclerosis. No enlarged abdominal or pelvic lymph nodes. Reproductive: Stable enlargement of the prostate. Other: No free fluid or free gas.  No abdominal wall hernia. Musculoskeletal: No acute or destructive bony lesions. Stable right hip osteoarthritis. Reconstructed images demonstrate ankylosing spondylitis, stable. IMPRESSION: 1. Small-bowel obstruction, with transition point within the ileum  in the lower abdomen/pelvis. 2. Bilateral nonobstructing renal calculi. 3. Stable enlargement of the prostate. 4. Aortic Atherosclerosis (ICD10-I70.0). Electronically Signed   By: Randa Ngo M.D.   On: 11/02/2019 00:32   DG Abd 2 Views  Result Date: 11/02/2019 CLINICAL DATA:  Lower abdominal pain. History of ankylosing spondylitis EXAM: ABDOMEN - 2 VIEW COMPARISON:  11/01/2019 FINDINGS: Enteric tube terminates within the gastric body. No dilated loops of small bowel are identified. No free intraperitoneal air. Calcifications projecting over the bilateral kidneys compatible with known bilateral nephrolithiasis. Stigmata of ankylosing spondylitis. Right total hip  arthroplasty. Aortic atherosclerosis. IMPRESSION: 1. Enteric tube terminates within the gastric body. 2. Nonobstructive bowel gas pattern. 3. Bilateral nephrolithiasis. 4. Ankylosing spondylitis. Electronically Signed   By: Davina Poke D.O.   On: 11/02/2019 08:16    Scheduled Meds: . B-complex with vitamin C  1 tablet Oral Daily  . calcium-vitamin D  1 tablet Oral BID  . cholecalciferol  1,000 Units Oral Daily  . citalopram  20 mg Oral Daily  . enoxaparin (LOVENOX) injection  40 mg Subcutaneous Q24H  . ferrous sulfate  325 mg Oral Q breakfast  . folic acid  3 mg Oral Daily  . ipratropium  2 spray Each Nare BID  . [START ON 11/05/2019] methotrexate  20 mg Oral Weekly  . polyvinyl alcohol  1 drop Both Eyes BID  . rosuvastatin  10 mg Oral Daily   Continuous Infusions: . lactated ringers with kcl 75 mL/hr at 11/03/19 3016    Assessment/Plan:  1. Small bowel obstruction.  Patient had an NG tube this morning.  With still a lot of output may end up needing the NG tube a little bit longer.  Hopefully can clamp while going for walks.  Continue IV fluid hydration.  Patient now passing gas and had bowel movement last night.  General surgery following. 2. Hypokalemia and hypomagnesemia.  Replace magnesium IV.  Add potassium to IV fluids. 3. Ankylosing spondylitis on methotrexate and folic acid.  4. Hyperlipidemia on Crestor. 5. Depression on Celexa.     Code Status:     Code Status Orders  (From admission, onward)         Start     Ordered   11/02/19 0124  Full code  Continuous        11/02/19 0130        Code Status History    Date Active Date Inactive Code Status Order ID Comments User Context   03/09/2019 0739 03/12/2019 2148 Full Code [id number]  Damita Lack, MD ED   11/06/2016 1539 11/08/2016 2018 Full Code [id number]  [facility] Inpatient   03/21/2015 1052 03/27/2015 1554 Full Code [id number]  [facility]asta ED   04/10/2014 0647 04/12/2014 1747 Full  Code [id number]  Rise Patience, MD Inpatient   Advance Care Planning Activity    Advance Directive Documentation     Most Recent Value  Type of Advance Directive Healthcare Power of [place], Living will  Pre-existing out of facility DNR order (yellow form or pink MOST form) --  "MOST" Form in Place? --     Family Communication: Deferred me calling family Disposition Plan: Status is: Inpatient  Dispo: The patient is from: Home              Anticipated d/c is to: Home              Anticipated d/c date is: Small bowel obstructions sometimes take up to  5 days or longer to improve.  Since patient passing gas and had a bowel movement that is a good sign.  Still having a lot of output from NG tube.              Patient currently being treated for small bowel obstruction.  NG tube must be removed prior to any disposition and patient must also need to be able to tolerate solid food.  Consultants:  General surgery  Time spent: 26 minutes, case discussed with general surgery.  [place]  [facility] [employer]

## 2019-11-04 ENCOUNTER — Inpatient Hospital Stay: Payer: Medicare Other

## 2019-11-04 LAB — BASIC METABOLIC PANEL
Anion gap: 9 (ref 5–15)
BUN: 10 mg/dL (ref 8–23)
CO2: 28 mmol/L (ref 22–32)
Calcium: 8.1 mg/dL — ABNORMAL LOW (ref 8.9–10.3)
Chloride: 101 mmol/L (ref 98–111)
Creatinine, Ser: 0.75 mg/dL (ref 0.61–1.24)
GFR calc Af Amer: >60 mL/min (ref >60)
GFR calc non Af Amer: >60 mL/min (ref >60)
Glucose, Bld: 98 mg/dL (ref 70–99)
Potassium: 3.7 mmol/L (ref 3.5–5.1)
Sodium: 138 mmol/L (ref 135–145)

## 2019-11-04 LAB — MAGNESIUM: Magnesium: 2 mg/dL (ref 1.7–2.4)

## 2019-11-04 NOTE — Progress Notes (Signed)
Patient ID: Steven [name], male   DOB: [birth year], 85 y.o.   MRN: [id number] [facility] PROGRESS NOTE  Steven [name] YQM:[id number] DOB: 30-Jul-1934 DOA: 11/01/2019 PCP: [name], MD  HPI/Subjective: Patient had multiple bowel movements.  Only slight tenderness in the abdomen.  No nausea.  Overall feeling better than yesterday.  Patient admitted with small bowel obstruction.  Objective: Vitals:   11/04/19 0434 11/04/19 1133  BP: (!) 128/59 (!) 108/59  Pulse: 66 64  Resp: 20 16  Temp: 98.9 F (37.2 C) 98.1 F (36.7 C)  SpO2: 97% 97%    Intake/Output Summary (Last 24 hours) at 11/04/2019 1630 Last data filed at 11/04/2019 1545 Gross per 24 hour  Intake 2244.5 ml  Output 1200 ml  Net 1044.5 ml   Filed Weights   11/01/19 1909  Weight: 64.9 kg    ROS: Review of Systems  Respiratory: Negative for shortness of breath.   Cardiovascular: Negative for chest pain.  Gastrointestinal: Negative for abdominal pain, nausea and vomiting.   Exam: Physical Exam HENT:     Head: Normocephalic.     Mouth/Throat:     Pharynx: No oropharyngeal exudate.  Eyes:     General: Lids are normal.     Conjunctiva/sclera: Conjunctivae normal.     Pupils: Pupils are equal, round, and reactive to light.  Cardiovascular:     Rate and Rhythm: Normal rate and regular rhythm.     Heart sounds: Normal heart sounds, S1 normal and S2 normal.  Pulmonary:     Breath sounds: No decreased breath sounds, wheezing, rhonchi or rales.  Abdominal:     General: Bowel sounds are normal.     Palpations: Abdomen is soft.     Tenderness: There is no abdominal tenderness.  Musculoskeletal:     Right ankle: No swelling.     Left ankle: No swelling.  Skin:    General: Skin is warm.     Findings: No rash.  Neurological:     Mental Status: He is alert and oriented to person, place, and time.       Data Reviewed: Basic Metabolic Panel: Recent Labs  Lab 11/01/19 1910 11/02/19 0503 11/03/19 0556  11/04/19 0624  NA 137 137 137 138  K 4.2 4.4 3.6 3.7  CL 100 99 101 101  CO2 26 31 32 28  GLUCOSE 115* 145* 113* 98  BUN 13 13 9 10   CREATININE 0.83 0.86 0.90 0.75  CALCIUM 9.3 9.0 7.9* 8.1*  MG  --   --  1.6* 2.0   Liver Function Tests: Recent Labs  Lab 11/01/19 1910  AST 27  ALT 23  ALKPHOS 52  BILITOT 1.1  PROT 7.6  ALBUMIN 4.4   Recent Labs  Lab 11/01/19 1910  LIPASE 34   CBC: Recent Labs  Lab 11/01/19 1910 11/02/19 0503  WBC 9.2 9.5  HGB 12.6* 12.4*  HCT 38.4* 37.8*  MCV 97.2 98.2  PLT 299 284     Recent Results (from the past 240 hour(s))  SARS Coronavirus 2 by RT PCR (hospital order, performed in [facility] hospital lab) Nasopharyngeal Nasopharyngeal Swab     Status: None   Collection Time: 11/02/19  2:48 AM   Specimen: Nasopharyngeal Swab  Result Value Ref Range Status   SARS Coronavirus 2 NEGATIVE NEGATIVE Final    Comment: (NOTE) SARS-CoV-2 target nucleic acids are NOT DETECTED.  The SARS-CoV-2 RNA is generally detectable in upper and lower respiratory specimens during the acute phase of  infection. The lowest concentration of SARS-CoV-2 viral copies this assay can detect is 250 copies / mL. A negative result does not preclude SARS-CoV-2 infection and should not be used as the sole basis for treatment or other patient management decisions.  A negative result may occur with improper specimen collection / handling, submission of specimen other than nasopharyngeal swab, presence of viral mutation(s) within the areas targeted by this assay, and inadequate number of viral copies (<250 copies / mL). A negative result must be combined with clinical observations, patient history, and epidemiological information.  Fact Sheet for Patients:   StrictlyIdeas.no  Fact Sheet for Healthcare Providers: BankingDealers.co.za  This test is not yet approved or  cleared by the [place] FDA and has been  authorized for detection and/or diagnosis of SARS-CoV-2 by FDA under an Emergency Use Authorization (EUA).  This EUA will remain in effect (meaning this test can be used) for the duration of the COVID-19 declaration under Section 564(b)(1) of the Act, 21 U.S.C. section 360bbb-3(b)(1), unless the authorization is terminated or revoked sooner.  Performed at [facility], [address] [place], [place] [address]      Studies: DG Abd 1 View  Result Date: 11/04/2019 CLINICAL DATA:  Small bowel obstruction. EXAM: ABDOMEN - 1 VIEW COMPARISON:  11/02/2019 FINDINGS: Stable position of enteric tube with tip terminating over the gastric body. No abnormal bowel dilatation identified. No findings of pneumoperitoneum. Previous cholecystectomy. Bony stigmata of ankylosing spondylitis. Previous right hip arthroplasty with moderate degenerative changes involving the left hip. IMPRESSION: 1. Nonobstructive bowel gas pattern. Tip of NG tube projects over the gastric body. 2. Stable position of enteric tube with tip terminating over the gastric fundus. Electronically Signed   By: Kerby Moors M.D.   On: 11/04/2019 10:00    Scheduled Meds: . B-complex with vitamin C  1 tablet Oral Daily  . calcium-vitamin D  1 tablet Oral BID  . cholecalciferol  1,000 Units Oral Daily  . citalopram  20 mg Oral Daily  . enoxaparin (LOVENOX) injection  40 mg Subcutaneous Q24H  . ferrous sulfate  325 mg Oral Q breakfast  . folic acid  3 mg Oral Daily  . ipratropium  2 spray Each Nare BID  . [START ON 11/05/2019] methotrexate  20 mg Oral Weekly  . oxymetazoline  1 spray Each Nare BID  . polyvinyl alcohol  1 drop Both Eyes BID  . rosuvastatin  10 mg Oral Daily   Continuous Infusions: . lactated ringers with kcl 75 mL/hr at 11/04/19 1545    Assessment/Plan:  1. Small bowel obstruction.  Patient had an NG tube this morning when I saw him.  NG tube removed.  Patient started on clear liquid diet.  Continue IV  fluid hydration. 2. Hypokalemia and hypomagnesemia.  Magnesium replaced yesterday into the normal range for today.  Potassium also in the normal range but I will continue and IV fluids. 3. Ankylosing spondylitis on methotrexate and folic acid 4. Hyperlipidemia unspecified on Crestor. 5. Depression on Celexa.     Code Status:     Code Status Orders  (From admission, onward)         Start     Ordered   11/02/19 0124  Full code  Continuous        11/02/19 0130        Code Status History    Date Active Date Inactive Code Status Order ID Comments User Context   03/09/2019 0739 03/12/2019 2148 Full Code  [id number]  Damita Lack, MD ED   11/06/2016 1539 11/08/2016 2018 Full Code [id number]  Neldon [facility] Inpatient   03/21/2015 1052 03/27/2015 1554 Full Code [id number]  [facility] ED   04/10/2014 0647 04/12/2014 1747 Full Code [id number]  Rise Patience, MD Inpatient   Advance Care Planning Activity    Advance Directive Documentation     Most Recent Value  Type of Advance Directive Healthcare Power of [place], Living will  Pre-existing out of facility DNR order (yellow form or pink MOST form) --  "MOST" Form in Place? --     Disposition Plan: Status is: Inpatient  Dispo: The patient is from: Home              Anticipated d/c is to: Home              Anticipated d/c date is: Likely another day or so depending on how quickly diet is advanced.  NG tube is out today              Patient currently being treated for small bowel obstruction.  NG tube removed today and started on clear liquid diet  Consultants:  General surgery  Time spent: 27 minutes  [place]

## 2019-11-04 NOTE — Evaluation (Signed)
Physical Therapy Evaluation Patient Details Name: Steven [name] MRN: [id number] DOB: [birth year] Today's Date: 11/04/2019   History of Present Illness  Steven [name] is an 85 y/o male who was admitted for small bowel obstruction. Chief complaints included acute onset of lower abdominal pain worsening since 7/20. PMH includes ankylkosing spondylitis, dyslipidemia, depression, recurrent small bowel obstruction likly d/t adhesions, arthritis, anemia, and dyspnea with exertion.  Clinical Impression  Pt received in bed with HOB elevated. Pt excited to participate in PT evaluation this morning. RN Steven [name] disconnected NG tube from suction for pt to ambulate away from bed. Pt performed bed mobility with mod I, sit to stand transfers independently, and ambulated around nurses station approximately 450 feet independently. No over LOB with activities. At this time, pt is mod I or above for all activities and does not require PT services. Pt left sitting up in recliner chair with all current needs within reach and [place] contacted to reconnect NG tube to suction.    Follow Up Recommendations No PT follow up    Equipment Recommendations  None recommended by PT    Recommendations for Other Services       Precautions / Restrictions Precautions Precautions: Fall Restrictions Weight Bearing Restrictions: No      Mobility  Bed Mobility Overal bed mobility: Modified Independent             General bed mobility comments: Pt utilized bedrail to exit bed to the R. HOB elevated. No physical assist required.  Transfers Overall transfer level: Independent Equipment used: None             General transfer comment: noted no balance with sit <> stand transfer from bed to chair.  Ambulation/Gait Ambulation/Gait assistance: Independent Gait Distance (Feet): 450 Feet Assistive device: None Gait Pattern/deviations: Step-through pattern Gait velocity: 10' in 6"   General Gait Details: Pt ambulated  twic around unit and down additional hallway without AD and required no physical assistance. No noted LOB with ambulation. Pt with decreased cervical rotation and slightly flexed at hips throughout ambulation distance,  Stairs            Wheelchair Mobility    Modified Rankin (Stroke Patients Only)       Balance Overall balance assessment: Modified Independent                                           Pertinent Vitals/Pain Pain Assessment: No/denies pain    Home Living Family/patient expects to be discharged to:: Private residence Living Arrangements: Spouse/significant other Available Help at Discharge: Family;Available 24 hours/day Type of Home: House Home Access: Stairs to enter Entrance Stairs-Rails: Right;Left;Can reach both Entrance Stairs-Number of Steps: 5 Home Layout: Two level (loft upstairs) Home Equipment: Environmental consultant - 2 wheels;Cane - single point;Bedside commode;Grab bars - tub/shower      Prior Function Level of Independence: Independent         Comments: Pt reports independence with ambulation and transfers and requiring no AD. Also reports ind with ADLs.     Hand Dominance        Extremity/Trunk Assessment   Upper Extremity Assessment Upper Extremity Assessment: Overall WFL for tasks assessed (grossly 4+/5 bilaterally)    Lower Extremity Assessment Lower Extremity Assessment: Overall WFL for tasks assessed (grossly 4+/5 bilaterally)       Communication   Communication: No difficulties  Cognition Arousal/Alertness: Awake/alert  Behavior During Therapy: WFL for tasks assessed/performed Overall Cognitive Status: Within Functional Limits for tasks assessed                                 General Comments: Pt A&O x 4.      General Comments      Exercises     Assessment/Plan    PT Assessment Patent does not need any further PT services  PT Problem List         PT Treatment Interventions      PT  Goals (Current goals can be found in the Care Plan section)  Acute Rehab PT Goals Patient Stated Goal: to continue to feel better PT Goal Formulation: With patient Time For Goal Achievement: 11/04/19 Potential to Achieve Goals: Good    Frequency     Barriers to discharge        Co-evaluation               AM-PAC PT "6 Clicks" Mobility  Outcome Measure Help needed turning from your back to your side while in a flat bed without using bedrails?: None Help needed moving from lying on your back to sitting on the side of a flat bed without using bedrails?: A Little Help needed moving to and from a bed to a chair (including a wheelchair)?: None Help needed standing up from a chair using your arms (e.g., wheelchair or bedside chair)?: None Help needed to walk in hospital room?: None Help needed climbing 3-5 steps with a railing? : A Little 6 Click Score: 22    End of Session Equipment Utilized During Treatment: Gait belt Activity Tolerance: Patient tolerated treatment well Patient left: in chair;with call bell/phone within reach Nurse Communication: Mobility status PT Visit Diagnosis: Unsteadiness on feet (R26.81)    Time: 5449-2010 PT Time Calculation (min) (ACUTE ONLY): 21 min   Charges:              Steven [name], SPT  Steven [name] 11/04/2019, 10:06 AM

## 2019-11-04 NOTE — Plan of Care (Signed)
  Problem: Education: Goal: Knowledge of General Education information will improve Description: Including pain rating scale, medication(s)/side effects and non-pharmacologic comfort measures Outcome: Progressing  Pt has been tolerating clear liquids diet.

## 2019-11-04 NOTE — Progress Notes (Signed)
[facility] Day(s): 2.   Post op day(s):  Marland Kitchen   Interval History: Patient seen and examined, no acute events or new complaints overnight. Patient reports feeling great today.  He reported that he passed gas and had bowel movement (x2).  He denies any nausea or vomiting.  Vital signs in last 24 hours: [min-max] current  Temp:  [98.5 F (36.9 C)-98.9 F (37.2 C)] 98.9 F (37.2 C) (07/24 0434) Pulse Rate:  [63-66] 66 (07/24 0434) Resp:  [20] 20 (07/24 0434) BP: (115-140)/(55-67) 128/59 (07/24 0434) SpO2:  [97 %-100 %] 97 % (07/24 0434)     Height: 5\' 6"  (167.6 cm) Weight: 64.9 kg BMI (Calculated): 23.09   Physical Exam:  Constitutional: alert, cooperative and no distress  Respiratory: breathing non-labored at rest  Cardiovascular: regular rate and sinus rhythm  Gastrointestinal: soft, non-tender, and non-distended  Labs:  CBC Latest Ref Rng & Units 11/02/2019 11/01/2019 09/14/2019  WBC 4.0 - 10.5 K/uL 9.5 9.2 6.2  Hemoglobin 13.0 - 17.0 g/dL 12.4(L) 12.6(L) 10.9(L)  Hematocrit 39 - 52 % 37.8(L) 38.4(L) 33.3(L)  Platelets 150 - 400 K/uL 284 299 209   CMP Latest Ref Rng & Units 11/04/2019 11/03/2019 11/02/2019  Glucose 70 - 99 mg/dL 98 113(H) 145(H)  BUN 8 - 23 mg/dL 10 9 13   Creatinine 0.61 - 1.24 mg/dL 0.75 0.90 0.86  Sodium 135 - 145 mmol/L 138 137 137  Potassium 3.5 - 5.1 mmol/L 3.7 3.6 4.4  Chloride 98 - 111 mmol/L 101 101 99  CO2 22 - 32 mmol/L 28 32 31  Calcium 8.9 - 10.3 mg/dL 8.1(L) 7.9(L) 9.0  Total Protein 6.5 - 8.1 g/dL - - -  Total Bilirubin 0.3 - 1.2 mg/dL - - -  Alkaline Phos 38 - 126 U/L - - -  AST 15 - 41 U/L - - -  ALT 0 - 44 U/L - - -    Imaging studies: No new pertinent imaging studies   Assessment/Plan:  85 y.o. male with small bowel obstruction, complicated by pertinent comorbidities including hyperlipidemia, depression.  Patient with what appears to be resolving SBO.  The fact that patient has multiple gas and bowel movements is  a great sign.  The physical exam shows a benign abdomen without distention or pain.  I discontinue and remove the NG tube.  I am going to start patient on clear liquid diet.  Assess diet toleration and advance as tolerated.  I will continue to follow and assist with the management of this patient.  I encouraged the patient to ambulate.  I agree with current medical management.  Arnold Long, MD

## 2019-11-05 NOTE — Progress Notes (Signed)
[facility] Day(s): 3.   Post op day(s):  Marland Kitchen   Interval History: Patient seen and examined, no acute events or new complaints overnight. Patient reports feeling good this morning.  He reported he continue passing gas and bowel movement.  He tolerated full liquids.  There is no abdominal pain.  There is no alleviating or aggravating factor.  There is no pain radiation.  Vital signs in last 24 hours: [min-max] current  Temp:  [97.5 F (36.4 C)-98.6 F (37 C)] 98.6 F (37 C) (07/25 0447) Pulse Rate:  [56-64] 56 (07/25 0447) Resp:  [16-20] 20 (07/25 0447) BP: (108-127)/(58-60) 112/60 (07/25 0447) SpO2:  [97 %-99 %] 99 % (07/25 0447)     Height: 5\' 6"  (167.6 cm) Weight: 64.9 kg BMI (Calculated): 23.09   Physical Exam:  Constitutional: alert, cooperative and no distress  Respiratory: breathing non-labored at rest  Cardiovascular: regular rate and sinus rhythm  Gastrointestinal: soft, non-tender, and non-distended  Labs:  CBC Latest Ref Rng & Units 11/02/2019 11/01/2019 09/14/2019  WBC 4.0 - 10.5 K/uL 9.5 9.2 6.2  Hemoglobin 13.0 - 17.0 g/dL 12.4(L) 12.6(L) 10.9(L)  Hematocrit 39 - 52 % 37.8(L) 38.4(L) 33.3(L)  Platelets 150 - 400 K/uL 284 299 209   CMP Latest Ref Rng & Units 11/04/2019 11/03/2019 11/02/2019  Glucose 70 - 99 mg/dL 98 113(H) 145(H)  BUN 8 - 23 mg/dL 10 9 13   Creatinine 0.61 - 1.24 mg/dL 0.75 0.90 0.86  Sodium 135 - 145 mmol/L 138 137 137  Potassium 3.5 - 5.1 mmol/L 3.7 3.6 4.4  Chloride 98 - 111 mmol/L 101 101 99  CO2 22 - 32 mmol/L 28 32 31  Calcium 8.9 - 10.3 mg/dL 8.1(L) 7.9(L) 9.0  Total Protein 6.5 - 8.1 g/dL - - -  Total Bilirubin 0.3 - 1.2 mg/dL - - -  Alkaline Phos 38 - 126 U/L - - -  AST 15 - 41 U/L - - -  ALT 0 - 44 U/L - - -    Imaging studies: No new pertinent imaging studies   Assessment/Plan:  85 y.o. male with small bowel obstruction, complicated by pertinent comorbidities including hyperlipidemia, depression.  Patient  with resolving SBO.  Tolerating full liquids.  Patient having bowel movement.  There is no abdominal pain.  I advance his diet to soft diet.  If he tolerates no contraindication for discharge from surgical standpoint.  Patient can be followed by [facility] as needed.  Arnold Long, MD

## 2019-11-05 NOTE — Discharge Instructions (Signed)
Bowel Obstruction °A bowel obstruction means that something is blocking the small or large bowel. The bowel is also called the intestine. It is the long tube that connects the stomach to the opening of the butt (anus). When something blocks the bowel, food and fluids cannot pass through like normal. This condition needs to be treated. Treatment depends on the cause of the problem and how bad the problem is. °What are the causes? °Common causes of this condition include: °· Scar tissue (adhesions) from past surgery or from high-energy X-rays (radiation). °· Recent surgery in the belly. This affects how food moves in the bowel. °· Some diseases, such as: °? Irritation of the lining of the digestive tract (Crohn's disease). °? Irritation of small pouches in the bowel (diverticulitis). °· Growths or tumors. °· A bulging organ (hernia). °· Twisting of the bowel (volvulus). °· A foreign body. °· Slipping of a part of the bowel into another part (intussusception). °What are the signs or symptoms? °Symptoms of this condition include: °· Pain in the belly. °· Feeling sick to your stomach (nauseous). °· Throwing up (vomiting). °· Bloating in the belly. °· Being unable to pass gas. °· Trouble pooping (constipation). °· Watery poop (diarrhea). °· A lot of belching. °How is this diagnosed? °This condition may be diagnosed based on: °· A physical exam. °· Medical history. °· Imaging tests, such as X-ray or CT scan. °· Blood tests. °· Urine tests. °How is this treated? °Treatment for this condition may include: °· Fluids and pain medicines that are given through an IV tube. Your doctor may tell you not to eat or drink if you feel sick to your stomach and are throwing up. °· Eating a clear liquid diet for a few days. °· Putting a small tube (nasogastric tube) into the stomach. This will help with pain, discomfort, and nausea by removing blocked air and fluids from the stomach. °· Surgery. This may be needed if other treatments do  not work. °Follow these instructions at home: °Medicines °· Take over-the-counter and prescription medicines only as told by your doctor. °· If you were prescribed an antibiotic medicine, take it as told by your doctor. Do not stop taking the antibiotic even if you start to feel better. °General instructions °· Follow your diet as told by your doctor. You may need to: °? Only drink clear liquids until you start to get better. °? Avoid solid foods. °· Return to your normal activities as told by your doctor. Ask your doctor what activities are safe for you. °· Do not sit for a long time without moving. Get up to take short walks every 1-2 hours. This is important. Ask for help if you feel weak or unsteady. °· Keep all follow-up visits as told by your doctor. This is important. °How is this prevented? °After having a bowel obstruction, you may be more likely to have another. You can do some things to stop it from happening again. °· If you have a long-term (chronic) disease, contact your doctor if you see changes or problems. °· Take steps to prevent or treat trouble pooping. Your doctor may ask that you: °? Drink enough fluid to keep your pee (urine) pale yellow. °? Take over-the-counter or prescription medicines. °? Eat foods that are high in fiber. These include beans, whole grains, and fresh fruits and vegetables. °? Limit foods that are high in fat and sugar. These include fried or sweet foods. °· Stay active. Ask your doctor which exercises are   safe for you. °· Avoid stress. °· Eat three small meals and three small snacks each day. °· Work with a food expert (dietitian) to make a meal plan that works for you. °· Do not use any products that contain nicotine or tobacco, such as cigarettes and e-cigarettes. If you need help quitting, ask your doctor. °Contact a doctor if: °· You have a fever. °· You have chills. °Get help right away if: °· You have pain or cramps that get worse. °· You throw up blood. °· You are  sick to your stomach. °· You cannot stop throwing up. °· You cannot drink fluids. °· You feel mixed up (confused). °· You feel very thirsty (dehydrated). °· Your belly gets more bloated. °· You feel weak or you pass out (faint). °Summary °· A bowel obstruction means that something is blocking the small or large bowel. °· Treatment may include IV fluids and pain medicine. You may also have a clear liquid diet, a small tube in your stomach, or surgery. °· Drink clear liquids and avoid solid foods until you get better. °This information is not intended to replace advice given to you by your health care provider. Make sure you discuss any questions you have with your health care provider. °Document Revised: 08/11/2017 Document Reviewed: 08/11/2017 °Elsevier Patient Education © 2020 [employer] Inc. ° °

## 2019-11-05 NOTE — Progress Notes (Signed)
Steven [name]  A and O x 4. VSS. Pt tolerating diet well. No complaints of pain or nausea. IV removed intact, no new prescriptions given. Pt voiced understanding of discharge instructions with no further questions. Pt discharged via wheelchair with NT.     Allergies as of 11/05/2019      Reactions   Other Hives   Indomethacin Hives   Lactose Intolerance (gi)    GI UPSET      Medication List    STOP taking these medications   ferrous sulfate 325 (65 FE) MG tablet     TAKE these medications   acetaminophen 500 MG tablet Commonly known as: TYLENOL Take by mouth.   acidophilus Caps capsule Take by mouth daily.   B-complex with vitamin C tablet Take by mouth.   Biotin 1 MG Caps Take 1 capsule by mouth daily.   Calcium Carbonate-Vitamin D 600-400 MG-UNIT tablet Take 1 tablet by mouth daily.   cholecalciferol 25 MCG (1000 UNIT) tablet Commonly known as: VITAMIN D Take 1,000 Units by mouth daily.   citalopram 20 MG tablet Commonly known as: CELEXA Take 20 mg by mouth daily.   folic acid 1 MG tablet Commonly known as: FOLVITE TAKE 3 TABLETS BY MOUTH DAILY.   ipratropium 0.06 % nasal spray Commonly known as: ATROVENT Place 2 sprays into both nostrils 2 (two) times daily.   methotrexate 2.5 MG tablet Commonly known as: RHEUMATREX TAKE 8 TABLETS BY MOUTH ONCE A WEEK.   PRESERVISION AREDS 2 PO Take 1 tablet by mouth in the morning and at bedtime.   rosuvastatin 10 MG tablet Commonly known as: CRESTOR Take 10 mg by mouth at bedtime.   Soothe XP Soln Place 1 drop into both eyes 2 (two) times daily.       Vitals:   11/05/19 0447 11/05/19 1128  BP: (!) 112/60 (!) 109/56  Pulse: 56 63  Resp: 20 18  Temp: 98.6 F (37 C) 97.9 F (36.6 C)  SpO2: 99% 97%    Francesco Sor

## 2019-11-05 NOTE — Discharge Summary (Signed)
[name]    MR#:  [id number]  DATE OF BIRTH:  11-Dec-1934  DATE OF ADMISSION:  11/01/2019 ADMITTING PHYSICIAN: Christel Mormon, MD  DATE OF DISCHARGE: 11/05/2019  1:59 PM  PRIMARY CARE PHYSICIAN: [name], MD    ADMISSION DIAGNOSIS:  SBO (small bowel obstruction) ([place]) [K56.609]  DISCHARGE DIAGNOSIS:  Active Problems:   SBO (small bowel obstruction) (HCC)   Ankylosing spondylitis of lumbosacral region ([place])   Depression   Hypokalemia   Hypomagnesemia   SECONDARY DIAGNOSIS:   Past Medical History:  Diagnosis Date  . Allergy    enviromental  . Anemia 2017  . Anxiety   . Arthritis    ankylosing spodilitis  . Blood transfusion without reported diagnosis    during surgery  . Cataract   . Depression   . Dyspnea    with exertion - had Echo done 09/29/16  . History of kidney stones   . Hyperlipidemia   . Intestinal obstruction ([place])   . Pneumonia    as a child    HOSPITAL COURSE:   1.  Small bowel obstruction.  The patient had an NG tube in from admission and was taken out on 7/24.  His diet was advanced from clear liquid diet to full liquid diet and then soft diet.  He tolerated soft diet on 11/05/2019 for breakfast and lunch and was stable for discharge home.  Seen by general surgery during the hospital course.  The patient has had 5 episodes of small bowel obstruction previously. 2.  Hypokalemia and hypomagnesemia during the hospital course secondary to being n.p.o.  Magnesium was replaced during the hospital course and potassium also replaced and IV fluids. 3.  Ankylosing spondylitis on methotrexate and folic acid 4.  Hyperlipidemia unspecified on Crestor 5.  Depression on Celexa  DISCHARGE CONDITIONS:   Satisfactory  CONSULTS OBTAINED:  Treatment Team:  Fredirick Maudlin, MD  DRUG ALLERGIES:   Allergies  Allergen Reactions  . Other Hives  . Indomethacin Hives  . Lactose Intolerance  (Gi)     GI UPSET    DISCHARGE MEDICATIONS:   Allergies as of 11/05/2019      Reactions   Other Hives   Indomethacin Hives   Lactose Intolerance (gi)    GI UPSET      Medication List    STOP taking these medications   ferrous sulfate 325 (65 FE) MG tablet     TAKE these medications   acetaminophen 500 MG tablet Commonly known as: TYLENOL Take by mouth.   acidophilus Caps capsule Take by mouth daily.   B-complex with vitamin C tablet Take by mouth.   Biotin 1 MG Caps Take 1 capsule by mouth daily.   Calcium Carbonate-Vitamin D 600-400 MG-UNIT tablet Take 1 tablet by mouth daily.   cholecalciferol 25 MCG (1000 UNIT) tablet Commonly known as: VITAMIN D Take 1,000 Units by mouth daily.   citalopram 20 MG tablet Commonly known as: CELEXA Take 20 mg by mouth daily.   folic acid 1 MG tablet Commonly known as: FOLVITE TAKE 3 TABLETS BY MOUTH DAILY.   ipratropium 0.06 % nasal spray Commonly known as: ATROVENT Place 2 sprays into both nostrils 2 (two) times daily.   methotrexate 2.5 MG tablet Commonly known as: RHEUMATREX TAKE 8 TABLETS BY MOUTH ONCE A WEEK.   PRESERVISION AREDS 2 PO Take 1 tablet by mouth in the morning and at bedtime.   rosuvastatin 10  MG tablet Commonly known as: CRESTOR Take 10 mg by mouth at bedtime.   Soothe XP Soln Place 1 drop into both eyes 2 (two) times daily.        DISCHARGE INSTRUCTIONS:   Follow-up PMD 5 days  If you experience worsening of your admission symptoms, develop shortness of breath, life threatening emergency, suicidal or homicidal thoughts you must seek medical attention immediately by calling 911 or calling your MD immediately  if symptoms less severe.  You Must read complete instructions/literature along with all the possible adverse reactions/side effects for all the Medicines you take and that have been prescribed to you. Take any new Medicines after you have completely understood and accept all the  possible adverse reactions/side effects.   Please note  You were cared for by a hospitalist during your hospital stay. If you have any questions about your discharge medications or the care you received while you were in the hospital after you are discharged, you can call the unit and asked to speak with the hospitalist on call if the hospitalist that took care of you is not available. Once you are discharged, your primary care physician will handle any further medical issues. Please note that NO REFILLS for any discharge medications will be authorized once you are discharged, as it is imperative that you return to your primary care physician (or establish a relationship with a primary care physician if you do not have one) for your aftercare needs so that they can reassess your need for medications and monitor your lab values.    Today   CHIEF COMPLAINT:   Chief Complaint  Patient presents with  . Abdominal Pain    HISTORY OF PRESENT ILLNESS:  Steven [name]  is a 85 y.o. male came in with abdominal pain and nausea vomiting   VITAL SIGNS:  Blood pressure (!) 109/56, pulse 63, temperature 97.9 F (36.6 C), temperature source Oral, resp. rate 18, height 5\' 6"  (1.676 m), weight 64.9 kg, SpO2 97 %.  I/O:    Intake/Output Summary (Last 24 hours) at 11/05/2019 1615 Last data filed at 11/05/2019 1356 Gross per 24 hour  Intake 1319.18 ml  Output 1700 ml  Net -380.82 ml    PHYSICAL EXAMINATION:  GENERAL:  85 y.o.-year-old patient lying in the bed with no acute distress.  EYES: Pupils equal, round, reactive to light and accommodation. No scleral icterus. HEENT: Head atraumatic, normocephalic. Oropharynx and nasopharynx clear.  LUNGS: Normal breath sounds bilaterally, no wheezing, rales,rhonchi or crepitation. No use of accessory muscles of respiration.  CARDIOVASCULAR: S1, S2 normal. No murmurs, rubs, or gallops.  ABDOMEN: Soft, non-tender, non-distended.  EXTREMITIES: No pedal edema.   NEUROLOGIC: Cranial nerves II through XII are intact. Muscle strength 5/5 in all extremities. Sensation intact. Gait not checked.  PSYCHIATRIC: The patient is alert and oriented x 3.  SKIN: No obvious rash, lesion, or ulcer.   DATA REVIEW:   CBC Recent Labs  Lab 11/02/19 0503  WBC 9.5  HGB 12.4*  HCT 37.8*  PLT 284    Chemistries  Recent Labs  Lab 11/01/19 1910 11/02/19 0503 11/04/19 0624  NA 137   < > 138  K 4.2   < > 3.7  CL 100   < > 101  CO2 26   < > 28  GLUCOSE 115*   < > 98  BUN 13   < > 10  CREATININE 0.83   < > 0.75  CALCIUM 9.3   < > 8.1*  MG  --    < > 2.0  AST 27  --   --   ALT 23  --   --   ALKPHOS 52  --   --   BILITOT 1.1  --   --    < > = values in this interval not displayed.    Microbiology Results  Results for orders placed or performed during the hospital encounter of 11/01/19  SARS Coronavirus 2 by RT PCR (hospital order, performed in [facility] hospital lab) Nasopharyngeal Nasopharyngeal Swab     Status: None   Collection Time: 11/02/19  2:48 AM   Specimen: Nasopharyngeal Swab  Result Value Ref Range Status   SARS Coronavirus 2 NEGATIVE NEGATIVE Final    Comment: (NOTE) SARS-CoV-2 target nucleic acids are NOT DETECTED.  The SARS-CoV-2 RNA is generally detectable in upper and lower respiratory specimens during the acute phase of infection. The lowest concentration of SARS-CoV-2 viral copies this assay can detect is 250 copies / mL. A negative result does not preclude SARS-CoV-2 infection and should not be used as the sole basis for treatment or other patient management decisions.  A negative result may occur with improper specimen collection / handling, submission of specimen other than nasopharyngeal swab, presence of viral mutation(s) within the areas targeted by this assay, and inadequate number of viral copies (<250 copies / mL). A negative result must be combined with clinical observations, patient history, and epidemiological  information.  Fact Sheet for Patients:   StrictlyIdeas.no  Fact Sheet for Healthcare Providers: BankingDealers.co.za  This test is not yet approved or  cleared by the [place] FDA and has been authorized for detection and/or diagnosis of SARS-CoV-2 by FDA under an Emergency Use Authorization (EUA).  This EUA will remain in effect (meaning this test can be used) for the duration of the COVID-19 declaration under Section 564(b)(1) of the Act, 21 U.S.C. section 360bbb-3(b)(1), unless the authorization is terminated or revoked sooner.  Performed at [facility] [address] [place], [place] [address]     RADIOLOGY:  Bea Graff 1 View  Result Date: 11/04/2019 CLINICAL DATA:  Small bowel obstruction. EXAM: ABDOMEN - 1 VIEW COMPARISON:  11/02/2019 FINDINGS: Stable position of enteric tube with tip terminating over the gastric body. No abnormal bowel dilatation identified. No findings of pneumoperitoneum. Previous cholecystectomy. Bony stigmata of ankylosing spondylitis. Previous right hip arthroplasty with moderate degenerative changes involving the left hip. IMPRESSION: 1. Nonobstructive bowel gas pattern. Tip of NG tube projects over the gastric body. 2. Stable position of enteric tube with tip terminating over the gastric fundus. Electronically Signed   By: Kerby Moors M.D.   On: 11/04/2019 10:00      Management plans discussed with the patient, and he is in agreement.  CODE STATUS:     Code Status Orders  (From admission, onward)         Start     Ordered   11/02/19 0124  Full code  Continuous        11/02/19 0130        Code Status History    Date Active Date Inactive Code Status Order ID Comments User Context   03/09/2019 [id number] 03/12/2019 2148 Full Code [id number]  Damita Lack, MD ED   11/06/2016 1539 11/08/2016 2018 Full Code [id number]  [facility] Inpatient   03/21/2015 1052 03/27/2015 1554  Full Code [id number]  [facility] ED   04/10/2014 [id number] 04/12/2014 1747 Full  Code [id number]  Rise Patience, MD Inpatient   Advance Care Planning Activity    Advance Directive Documentation     Most Recent Value  Type of Advance Directive Healthcare Power of Attorney, Living will  Pre-existing out of facility DNR order (yellow form or pink MOST form) --  "MOST" Form in Place? --      TOTAL TIME TAKING CARE OF THIS PATIENT: 32 minutes.    Loletha Grayer M.D on 11/05/2019 at 4:15 PM  Between 7am to 6pm - Pager - [phone]  After 6pm go to [email or web address] - password [email or web address]  [facility]  CC: Primary care physician; [name], MD

## 2019-11-08 DIAGNOSIS — E876 Hypokalemia: Secondary | ICD-10-CM | POA: Diagnosis not present

## 2019-11-08 DIAGNOSIS — E1122 Type 2 diabetes mellitus with diabetic chronic kidney disease: Secondary | ICD-10-CM | POA: Diagnosis not present

## 2019-11-08 DIAGNOSIS — Z8673 Personal history of transient ischemic attack (TIA), and cerebral infarction without residual deficits: Secondary | ICD-10-CM | POA: Diagnosis not present

## 2019-11-08 DIAGNOSIS — N182 Chronic kidney disease, stage 2 (mild): Secondary | ICD-10-CM | POA: Diagnosis not present

## 2019-11-08 DIAGNOSIS — Z09 Encounter for follow-up examination after completed treatment for conditions other than malignant neoplasm: Secondary | ICD-10-CM | POA: Diagnosis not present

## 2019-11-10 DIAGNOSIS — R350 Frequency of micturition: Secondary | ICD-10-CM | POA: Diagnosis not present

## 2019-11-10 DIAGNOSIS — N200 Calculus of kidney: Secondary | ICD-10-CM | POA: Diagnosis not present

## 2019-11-10 DIAGNOSIS — R3915 Urgency of urination: Secondary | ICD-10-CM | POA: Diagnosis not present

## 2019-11-16 ENCOUNTER — Other Ambulatory Visit: Payer: Self-pay

## 2019-11-16 ENCOUNTER — Other Ambulatory Visit: Payer: Self-pay | Admitting: Hematology and Oncology

## 2019-11-16 ENCOUNTER — Inpatient Hospital Stay: Payer: Medicare Other | Attending: Hematology and Oncology | Admitting: Hematology and Oncology

## 2019-11-16 ENCOUNTER — Inpatient Hospital Stay: Payer: Medicare Other

## 2019-11-16 ENCOUNTER — Encounter: Payer: Self-pay | Admitting: Hematology and Oncology

## 2019-11-16 VITALS — BP 123/71 | HR 65 | Temp 97.9°F | Resp 20 | Ht 66.0 in | Wt 143.4 lb

## 2019-11-16 DIAGNOSIS — R0609 Other forms of dyspnea: Secondary | ICD-10-CM | POA: Diagnosis not present

## 2019-11-16 DIAGNOSIS — D500 Iron deficiency anemia secondary to blood loss (chronic): Secondary | ICD-10-CM | POA: Diagnosis not present

## 2019-11-16 DIAGNOSIS — Z87442 Personal history of urinary calculi: Secondary | ICD-10-CM | POA: Insufficient documentation

## 2019-11-16 DIAGNOSIS — M8580 Other specified disorders of bone density and structure, unspecified site: Secondary | ICD-10-CM | POA: Diagnosis not present

## 2019-11-16 DIAGNOSIS — M129 Arthropathy, unspecified: Secondary | ICD-10-CM | POA: Diagnosis not present

## 2019-11-16 DIAGNOSIS — N400 Enlarged prostate without lower urinary tract symptoms: Secondary | ICD-10-CM | POA: Diagnosis not present

## 2019-11-16 DIAGNOSIS — D509 Iron deficiency anemia, unspecified: Secondary | ICD-10-CM | POA: Diagnosis not present

## 2019-11-16 DIAGNOSIS — D649 Anemia, unspecified: Secondary | ICD-10-CM | POA: Diagnosis not present

## 2019-11-16 DIAGNOSIS — Z87891 Personal history of nicotine dependence: Secondary | ICD-10-CM | POA: Diagnosis not present

## 2019-11-16 DIAGNOSIS — K56609 Unspecified intestinal obstruction, unspecified as to partial versus complete obstruction: Secondary | ICD-10-CM | POA: Insufficient documentation

## 2019-11-16 DIAGNOSIS — I700 Atherosclerosis of aorta: Secondary | ICD-10-CM | POA: Insufficient documentation

## 2019-11-16 DIAGNOSIS — R0602 Shortness of breath: Secondary | ICD-10-CM | POA: Insufficient documentation

## 2019-11-16 DIAGNOSIS — F418 Other specified anxiety disorders: Secondary | ICD-10-CM | POA: Insufficient documentation

## 2019-11-16 DIAGNOSIS — N200 Calculus of kidney: Secondary | ICD-10-CM | POA: Insufficient documentation

## 2019-11-16 DIAGNOSIS — M1611 Unilateral primary osteoarthritis, right hip: Secondary | ICD-10-CM | POA: Insufficient documentation

## 2019-11-16 DIAGNOSIS — Z79899 Other long term (current) drug therapy: Secondary | ICD-10-CM | POA: Insufficient documentation

## 2019-11-16 DIAGNOSIS — E785 Hyperlipidemia, unspecified: Secondary | ICD-10-CM | POA: Diagnosis not present

## 2019-11-16 DIAGNOSIS — K315 Obstruction of duodenum: Secondary | ICD-10-CM | POA: Insufficient documentation

## 2019-11-16 DIAGNOSIS — Z803 Family history of malignant neoplasm of breast: Secondary | ICD-10-CM | POA: Insufficient documentation

## 2019-11-16 LAB — CBC WITH DIFFERENTIAL (CANCER CENTER ONLY)
Abs Immature Granulocytes: 0.03 10*3/uL (ref 0.00–0.07)
Basophils Absolute: 0.1 10*3/uL (ref 0.0–0.1)
Basophils Relative: 1 %
Eosinophils Absolute: 0.4 10*3/uL (ref 0.0–0.5)
Eosinophils Relative: 5 %
HCT: 35.5 % — ABNORMAL LOW (ref 39.0–52.0)
Hemoglobin: 11.8 g/dL — ABNORMAL LOW (ref 13.0–17.0)
Immature Granulocytes: 0 %
Lymphocytes Relative: 17 %
Lymphs Abs: 1.3 10*3/uL (ref 0.7–4.0)
MCH: 32.1 pg (ref 26.0–34.0)
MCHC: 33.2 g/dL (ref 30.0–36.0)
MCV: 96.5 fL (ref 80.0–100.0)
Monocytes Absolute: 0.7 10*3/uL (ref 0.1–1.0)
Monocytes Relative: 8 %
Neutro Abs: 5.3 10*3/uL (ref 1.7–7.7)
Neutrophils Relative %: 69 %
Platelet Count: 303 10*3/uL (ref 150–400)
RBC: 3.68 MIL/uL — ABNORMAL LOW (ref 4.22–5.81)
RDW: 13.5 % (ref 11.5–15.5)
WBC Count: 7.8 10*3/uL (ref 4.0–10.5)
nRBC: 0 % (ref 0.0–0.2)

## 2019-11-16 LAB — CMP (CANCER CENTER ONLY)
ALT: 17 U/L (ref 0–44)
AST: 21 U/L (ref 15–41)
Albumin: 3.8 g/dL (ref 3.5–5.0)
Alkaline Phosphatase: 52 U/L (ref 38–126)
Anion gap: 5 (ref 5–15)
BUN: 14 mg/dL (ref 8–23)
CO2: 29 mmol/L (ref 22–32)
Calcium: 9.7 mg/dL (ref 8.9–10.3)
Chloride: 103 mmol/L (ref 98–111)
Creatinine: 0.81 mg/dL (ref 0.61–1.24)
GFR, Est AFR Am: >60 mL/min (ref >60)
GFR, Estimated: >60 mL/min (ref >60)
Glucose, Bld: 107 mg/dL — ABNORMAL HIGH (ref 70–99)
Potassium: 4.4 mmol/L (ref 3.5–5.1)
Sodium: 137 mmol/L (ref 135–145)
Total Bilirubin: 0.7 mg/dL (ref 0.3–1.2)
Total Protein: 7.3 g/dL (ref 6.5–8.1)

## 2019-11-16 LAB — IRON AND TIBC
Iron: 102 ug/dL (ref 42–163)
Saturation Ratios: 29 % (ref 20–55)
TIBC: 348 ug/dL (ref 202–409)
UIBC: 246 ug/dL (ref 117–376)

## 2019-11-16 LAB — SEDIMENTATION RATE: Sed Rate: 18 mm/hr — ABNORMAL HIGH (ref 0–16)

## 2019-11-16 LAB — RETIC PANEL
Immature Retic Fract: 13.9 % (ref 2.3–15.9)
RBC.: 3.7 MIL/uL — ABNORMAL LOW (ref 4.22–5.81)
Retic Count, Absolute: 25.5 10*3/uL (ref 19.0–186.0)
Retic Ct Pct: 0.7 % (ref 0.4–3.1)
Reticulocyte Hemoglobin: 38.8 pg (ref >27.9)

## 2019-11-16 LAB — FERRITIN: Ferritin: 60 ng/mL (ref 24–336)

## 2019-11-16 NOTE — Progress Notes (Signed)
[place] Telephone:([phone]) [phone]   Fax:(336) [phone]  PROGRESS NOTE  Patient Care Team: [name], MD as PCP - General (Internal Medicine)  Hematological/Oncological History #Normocytic Anemia #Iron Deficiency Anemia 1) 01/01/2017: WBC 5.1, Hgb 11.0, Plt 254, MCV 91.6 2) 12/07/2017: WBC 8.0, Hgb 11.9, Plt 352, MCV 91.4. Iron 76, TIBC 359, Sat 21%, Ferritin 31 3) 03/09/2019: WBC 14.3, Hgb 11.1, Plt 374, MCV 88.8 4) 05/22/2019: WBC 5.5, Hgb 10.7, Plt 311, MCV 83.7. Routine PCP visit. 5) 06/14/2019: establish care with Dr. Lorenso Courier. WBC 5.0, Hgb 10.3, MCV 84.9, Plt 320 6) 09/14/2019: WBC 6.2, Hgb 10.9, MCV 91.2, Plt 209 7)  11/01/2019: WBC 9.2, Hgb 12.6, MVC 97.2, Plt 299  Interval History:  Steven [name] 85 y.o. male with medical history significant for iron deficiency anemia who presents for a follow up visit. The patient's last visit was on 09/14/2019 at which time his Hgb was rising modestly. In the interim since the last visit Steven [name] had a hospital admission from 7/21-7/25/2021 for an SBO, which is a chronic occurrence for him 2/2 to adhesions.   On exam today Steven [name] notes that he frequently has SBO's and that most recently he had one in November 2020.  He notes that this is a shorter span of time between 2 SBO's.  He refers to them as "abdominal incursions".  The patient reports that at the time he developed the SBO he had to stop his iron pills due to concern that it may be contributing to his SBO.  The patient notes that he does not feel this is contributory as he does not even have constipation while he is taking that medication.  He reports today that he does not have any issues with shortness of breath or fatigue.  He notes that he does get some dyspnea on exertion when walking up stairs, however he does use this as exercise.  He notes that this is at baseline and he believes it to be "perfectly normal" for someone his age.  He also reports that he is eating a normal diet  has not increased his uptake and iron rich foods.  He notes that he does not particularly enjoy liver.  A full 10 point ROS is listed below.  MEDICAL HISTORY:  Past Medical History:  Diagnosis Date  . Allergy    enviromental  . Anemia 2017  . Anxiety   . Arthritis    ankylosing spodilitis  . Blood transfusion without reported diagnosis    during surgery  . Cataract   . Depression   . Dyspnea    with exertion - had Echo done 09/29/16  . History of kidney stones   . Hyperlipidemia   . Intestinal obstruction ([place])   . Pneumonia    as a child    SURGICAL HISTORY: Past Surgical History:  Procedure Laterality Date  . ABDOMINAL SURGERY     had abcess  . APPENDECTOMY    . CHOLECYSTECTOMY     with lysis of adhesions  . COLONOSCOPY    . COLOSTOMY    . COLOSTOMY REVERSAL    . EYE SURGERY Left    scar tissue removed from cornea  . EYE SURGERY Right    cataract surgery with lens implant  . JOINT REPLACEMENT Right    hip  x 2 1999 and 2007  . SHOULDER ARTHROSCOPY WITH ROTATOR CUFF REPAIR AND SUBACROMIAL DECOMPRESSION Left 03/02/2013   Procedure: LEFT SHOULDER ARTHROSCOPY WITH SUBACROMIAL DECOMPRESSION DISTAL CALVICLE RESECTION AND  ROTATOR CUFF REPAIR ;  Surgeon: [name], MD;  Location: [facility];  Service: Orthopedics;  Laterality: Left;  . TOTAL HIP REVISION Right 11/06/2016   Procedure: TOTAL HIP REVISION OF THE ACETABULAR COMPONENT;  Surgeon: [name], MD;  Location: [facility];  Service: Orthopedics;  Laterality: Right;    SOCIAL HISTORY: Social History   Socioeconomic History  . Marital status: Married    Spouse name: Not on file  . Number of children: 2  . Years of education: Not on file  . Highest education level: Not on file  Occupational History  . Occupation: retired  Tobacco Use  . Smoking status: Former Research scientist (life sciences)  . Smokeless tobacco: Never Used  Vaping Use  . Vaping Use: Never used  Substance and Sexual Activity  . Alcohol use: Yes    Comment: 3 beers or  glasses of wine a day--reports stopped ETOH 11/01/16   . Drug use: No  . Sexual activity: Never  Other Topics Concern  . Not on file  Social History Narrative  . Not on file   Social Determinants of Health   Financial Resource Strain: Low Risk   . Difficulty of Paying Living Expenses: Not hard at all  Food Insecurity: No Food Insecurity  . Worried About Charity fundraiser in the Last Year: Never true  . Ran Out of Food in the Last Year: Never true  Transportation Needs: No Transportation Needs  . Lack of Transportation (Medical): No  . Lack of Transportation (Non-Medical): No  Physical Activity: Insufficiently Active  . Days of Exercise per Week: 5 days  . Minutes of Exercise per Session: 10 min  Stress: No Stress Concern Present  . Feeling of Stress : Not at all  Social Connections: Unknown  . Frequency of Communication with Friends and Family: Patient refused  . Frequency of Social Gatherings with Friends and Family: Patient refused  . Attends Religious Services: Patient refused  . Active Member of Clubs or Organizations: Patient refused  . Attends Archivist Meetings: Patient refused  . Marital Status: Patient refused  Intimate Partner Violence: Not At Risk  . Fear of Current or Ex-Partner: No  . Emotionally Abused: No  . Physically Abused: No  . Sexually Abused: No    FAMILY HISTORY: Family History  Problem Relation Age of Onset  . Heart attack Mother   . Diabetes Mother   . Breast cancer Mother   . Heart attack Maternal Grandfather   . Pancreatic cancer Brother   . Colon cancer Neg Hx   . Esophageal cancer Neg Hx   . Stomach cancer Neg Hx     ALLERGIES:  is allergic to other, indomethacin, and lactose intolerance (gi).  MEDICATIONS:  Current Outpatient Medications  Medication Sig Dispense Refill  . acetaminophen (TYLENOL) 500 MG tablet Take by mouth.    Marland Kitchen acidophilus (RISAQUAD) CAPS capsule Take by mouth daily.    . Artificial Tear Solution  (SOOTHE XP) SOLN Place 1 drop into both eyes 2 (two) times daily.    . B Complex-C (B-COMPLEX WITH VITAMIN C) tablet Take by mouth.    . Biotin 1 MG CAPS Take 1 capsule by mouth daily.     . Calcium Carbonate-Vitamin D 600-400 MG-UNIT tablet Take 1 tablet by mouth daily.     . cholecalciferol (VITAMIN D) 1000 units tablet Take 1,000 Units by mouth daily.    . citalopram (CELEXA) 20 MG tablet Take 20 mg by mouth daily.     Marland Kitchen  folic acid (FOLVITE) 1 MG tablet TAKE 3 TABLETS BY MOUTH DAILY.    Marland Kitchen ipratropium (ATROVENT) 0.06 % nasal spray Place 2 sprays into both nostrils 2 (two) times daily.    . methotrexate (RHEUMATREX) 2.5 MG tablet TAKE 8 TABLETS BY MOUTH ONCE A WEEK.    . Multiple Vitamins-Minerals (PRESERVISION AREDS 2 PO) Take 1 tablet by mouth in the morning and at bedtime.    . rosuvastatin (CRESTOR) 10 MG tablet Take 10 mg by mouth at bedtime.      No current facility-administered medications for this visit.    REVIEW OF SYSTEMS:   Constitutional: ( - ) fevers, ( - )  chills , ( - ) night sweats Eyes: ( - ) blurriness of vision, ( - ) double vision, ( - ) watery eyes Ears, nose, mouth, throat, and face: ( - ) mucositis, ( - ) sore throat Respiratory: ( - ) cough, ( - ) dyspnea, ( - ) wheezes Cardiovascular: ( - ) palpitation, ( - ) chest discomfort, ( - ) lower extremity swelling Gastrointestinal:  ( - ) nausea, ( - ) heartburn, ( - ) change in bowel habits Skin: ( - ) abnormal skin rashes Lymphatics: ( - ) new lymphadenopathy, ( - ) easy bruising Neurological: ( - ) numbness, ( - ) tingling, ( - ) new weaknesses Behavioral/Psych: ( - ) mood change, ( - ) new changes  All other systems were reviewed with the patient and are negative.  PHYSICAL EXAMINATION: ECOG PERFORMANCE STATUS: 1 - Symptomatic but completely ambulatory  Vitals:   11/16/19 1444  BP: 123/71  Pulse: 65  Resp: 20  Temp: 97.9 F (36.6 C)  SpO2: 100%   Filed Weights   11/16/19 1444  Weight: 143 lb 6.4 oz  (65 kg)    GENERAL: well appearing elderly Caucasian male in NAD  SKIN: skin color, texture, turgor are normal, no rashes or significant lesions EYES: conjunctiva are pink and non-injected, sclera clear LUNGS: clear to auscultation and percussion with normal breathing effort HEART: regular rate & rhythm and no murmurs and no lower extremity edema ABDOMEN: soft, non-tender, non-distended, normal bowel sounds. No HSM appreciated. Musculoskeletal: no cyanosis of digits and no clubbing  PSYCH: alert & oriented x 3, fluent speech NEURO: no focal motor/sensory deficits  LABORATORY DATA:  I have reviewed the data as listed CBC Latest Ref Rng & Units 11/16/2019 11/02/2019 11/01/2019  WBC 4.0 - 10.5 K/uL 7.8 9.5 9.2  Hemoglobin 13.0 - 17.0 g/dL 11.8(L) 12.4(L) 12.6(L)  Hematocrit 39 - 52 % 35.5(L) 37.8(L) 38.4(L)  Platelets 150 - 400 K/uL 303 284 299    CMP Latest Ref Rng & Units 11/16/2019 11/04/2019 11/03/2019  Glucose 70 - 99 mg/dL 107(H) 98 113(H)  BUN 8 - 23 mg/dL 14 10 9   Creatinine 0.61 - 1.24 mg/dL 0.81 0.75 0.90  Sodium 135 - 145 mmol/L 137 138 137  Potassium 3.5 - 5.1 mmol/L 4.4 3.7 3.6  Chloride 98 - 111 mmol/L 103 101 101  CO2 22 - 32 mmol/L 29 28 32  Calcium 8.9 - 10.3 mg/dL 9.7 8.1(L) 7.9(L)  Total Protein 6.5 - 8.1 g/dL 7.3 - -  Total Bilirubin 0.3 - 1.2 mg/dL 0.7 - -  Alkaline Phos 38 - 126 U/L 52 - -  AST 15 - 41 U/L 21 - -  ALT 0 - 44 U/L 17 - -    No results found for: MPROTEIN Lab Results  Component Value Date   KPAFRELGTCHN  16.5 06/14/2019   LAMBDASER 14.3 06/14/2019   KAPLAMBRATIO 1.15 06/14/2019    RADIOGRAPHIC STUDIES: DG Abd 1 View  Result Date: 11/04/2019 CLINICAL DATA:  Small bowel obstruction. EXAM: ABDOMEN - 1 VIEW COMPARISON:  11/02/2019 FINDINGS: Stable position of enteric tube with tip terminating over the gastric body. No abnormal bowel dilatation identified. No findings of pneumoperitoneum. Previous cholecystectomy. Bony stigmata of ankylosing  spondylitis. Previous right hip arthroplasty with moderate degenerative changes involving the left hip. IMPRESSION: 1. Nonobstructive bowel gas pattern. Tip of NG tube projects over the gastric body. 2. Stable position of enteric tube with tip terminating over the gastric fundus. Electronically Signed   By: Kerby Moors M.D.   On: 11/04/2019 10:00   DG Abdomen 1 View  Result Date: 11/01/2019 CLINICAL DATA:  85 year old male with abdominal pain. EXAM: ABDOMEN - 1 VIEW COMPARISON:  Abdominal radiograph dated 03/10/2019 and CT dated 03/09/2019 FINDINGS: There is no bowel dilatation or evidence of obstruction. Postsurgical changes of the bowel with anastomotic suture in the left lower quadrant. No free air identified. Right upper quadrant cholecystectomy clips. Several small bilateral renal calculi noted. There is osteopenia with degenerative changes and findings of ankylosing spondylitis. Right hip arthroplasty. No acute osseous pathology. IMPRESSION: 1. No evidence of bowel obstruction. 2. Bilateral renal calculi. 3. Ankylosing spondylitis. Electronically Signed   By: Anner Crete M.D.   On: 11/01/2019 19:24   CT ABDOMEN PELVIS W CONTRAST  Result Date: 11/02/2019 CLINICAL DATA:  Generalized abdominal pain, previous partial colectomy and history of bowel obstruction EXAM: CT ABDOMEN AND PELVIS WITH CONTRAST TECHNIQUE: Multidetector CT imaging of the abdomen and pelvis was performed using the standard protocol following bolus administration of intravenous contrast. CONTRAST:  145mL OMNIPAQUE IOHEXOL 300 MG/ML  SOLN COMPARISON:  03/09/2019 FINDINGS: Lower chest: No acute pleural or parenchymal lung disease. Hepatobiliary: No focal liver abnormality is seen. Status post cholecystectomy. No biliary dilatation. Pancreas: Unremarkable. No pancreatic ductal dilatation or surrounding inflammatory changes. Spleen: Normal in size without focal abnormality. Adrenals/Urinary Tract: There are bilateral nonobstructing  renal calculi, not appreciably changed since prior study. Largest calculus on the right measures 5 mm. Scattered areas of bilateral renal cortical scarring are again noted unchanged. The adrenals are normal. Bladder is minimally distended with no focal abnormality. Stomach/Bowel: Previous sigmoid colon resection and reanastomosis. Prior partial small-bowel resection. There are multiple dilated loops of small bowel, with decompression of the terminal ileum. Small bowel measures up to 3 cm in diameter, compatible with small-bowel obstruction. No bowel wall thickening or inflammatory change. Vascular/Lymphatic: Aortic atherosclerosis. No enlarged abdominal or pelvic lymph nodes. Reproductive: Stable enlargement of the prostate. Other: No free fluid or free gas.  No abdominal wall hernia. Musculoskeletal: No acute or destructive bony lesions. Stable right hip osteoarthritis. Reconstructed images demonstrate ankylosing spondylitis, stable. IMPRESSION: 1. Small-bowel obstruction, with transition point within the ileum in the lower abdomen/pelvis. 2. Bilateral nonobstructing renal calculi. 3. Stable enlargement of the prostate. 4. Aortic Atherosclerosis (ICD10-I70.0). Electronically Signed   By: Randa Ngo M.D.   On: 11/02/2019 00:32   DG Abd 2 Views  Result Date: 11/02/2019 CLINICAL DATA:  Lower abdominal pain. History of ankylosing spondylitis EXAM: ABDOMEN - 2 VIEW COMPARISON:  11/01/2019 FINDINGS: Enteric tube terminates within the gastric body. No dilated loops of small bowel are identified. No free intraperitoneal air. Calcifications projecting over the bilateral kidneys compatible with known bilateral nephrolithiasis. Stigmata of ankylosing spondylitis. Right total hip arthroplasty. Aortic atherosclerosis. IMPRESSION: 1. Enteric tube terminates within the  gastric body. 2. Nonobstructive bowel gas pattern. 3. Bilateral nephrolithiasis. 4. Ankylosing spondylitis. Electronically Signed   By: Davina Poke  D.O.   On: 11/02/2019 08:16    ASSESSMENT & PLAN Steven [name] 85 y.o. male with medical history significant for iron deficiency anemia who presents for a follow up visit.  After review of the labs and discussion with the patient the findings are most consistent with continued iron deficiency anemia.  On exam today Steven [name] notes that he is feeling quite well and is not having any issues with shortness of breath or fatigue.  He reports he is not currently taking his p.o. iron medication as result of the SBO he had at the end of July.  He notes that he is open to consideration of IV iron therapy in the event that his iron levels are low enough to require consideration.  Otherwise he has no questions concerns or complaints today.  GI evaluations: EGD 03/11/2017: normal stomach and esophagus. Mild duodenal stenosis Colonoscopy 11/17/2013: moderate diverticulosis  #Normocytic Anemia -- increase in Hgb to 12.6 (on 11/01/2019) from 10.9 noted at last visit.  --patient endorses being compliant with his PO iron sulfate 325mg  PO daily with a source of vitamin C up until the SBO, when it was recommended that he stop.  --repeat Iron panel, ferritin, and reticulocyte panel today.  --will recommend IV iron to the patient if iron levels remain low  --may need to consider further GI evaluation if Hgb declines again. Steven [name] previously had a positive FOB card --RTC in 3 months or sooner if IV iron is desired.   No orders of the defined types were placed in this encounter.   All questions were answered. The patient knows to call the clinic with any problems, questions or concerns.  A total of more than 30 minutes were spent on this encounter and over half of that time was spent on counseling and coordination of care as outlined above.   Ledell Peoples, MD Department of Hematology/Oncology [place] at [facility] Phone: [phone] Pager: [phone] Email:  [email or web address]  11/16/2019 4:56 PM

## 2019-11-17 ENCOUNTER — Telehealth: Payer: Self-pay | Admitting: *Deleted

## 2019-11-17 ENCOUNTER — Telehealth: Payer: Self-pay | Admitting: Hematology and Oncology

## 2019-11-17 NOTE — Telephone Encounter (Signed)
Scheduled per los. Called and spoke with patient. Confirmed appt 

## 2019-11-17 NOTE — Telephone Encounter (Signed)
-----   Message from Orson Slick, MD sent at 11/17/2019  9:37 AM EDT ----- Please let Mr. Betty know that his iron labs look good. He can restart his iron when his PCP tells him it is OK. There is no need for IV iron at this time. We will continue to follow him in clinic, with a repeat visit in 3 months. ----- Message ----- From: Interface, Lab In [place] Sent: 11/16/2019   2:23 PM EDT To: Orson Slick, MD

## 2019-11-17 NOTE — Telephone Encounter (Signed)
TCT patient regarding recent lab results. Spoke with patient and informed him that his iron studies looked good. Dr. Lorenso Courier advises that he resume his oral iron when his PCP says it is ok to restart. Advised that we will see him back in 3 months with repeat labs. He voiced understanding.

## 2019-11-20 LAB — ERYTHROPOIETIN: Erythropoietin: 14.5 m[IU]/mL (ref 2.6–18.5)

## 2019-12-12 DIAGNOSIS — H35711 Central serous chorioretinopathy, right eye: Secondary | ICD-10-CM | POA: Diagnosis not present

## 2019-12-12 DIAGNOSIS — H44111 Panuveitis, right eye: Secondary | ICD-10-CM | POA: Diagnosis not present

## 2019-12-12 DIAGNOSIS — M452 Ankylosing spondylitis of cervical region: Secondary | ICD-10-CM | POA: Diagnosis not present

## 2019-12-12 DIAGNOSIS — Z961 Presence of intraocular lens: Secondary | ICD-10-CM | POA: Diagnosis not present

## 2019-12-12 DIAGNOSIS — Z79899 Other long term (current) drug therapy: Secondary | ICD-10-CM | POA: Diagnosis not present

## 2019-12-14 DIAGNOSIS — H44111 Panuveitis, right eye: Secondary | ICD-10-CM | POA: Diagnosis not present

## 2019-12-14 DIAGNOSIS — Z79899 Other long term (current) drug therapy: Secondary | ICD-10-CM | POA: Diagnosis not present

## 2019-12-25 DIAGNOSIS — E785 Hyperlipidemia, unspecified: Secondary | ICD-10-CM | POA: Diagnosis not present

## 2019-12-25 DIAGNOSIS — N182 Chronic kidney disease, stage 2 (mild): Secondary | ICD-10-CM | POA: Diagnosis not present

## 2019-12-25 DIAGNOSIS — E1122 Type 2 diabetes mellitus with diabetic chronic kidney disease: Secondary | ICD-10-CM | POA: Diagnosis not present

## 2019-12-25 DIAGNOSIS — Z8719 Personal history of other diseases of the digestive system: Secondary | ICD-10-CM | POA: Diagnosis not present

## 2019-12-25 DIAGNOSIS — E559 Vitamin D deficiency, unspecified: Secondary | ICD-10-CM | POA: Diagnosis not present

## 2019-12-27 DIAGNOSIS — Z23 Encounter for immunization: Secondary | ICD-10-CM | POA: Diagnosis not present

## 2019-12-27 DIAGNOSIS — E785 Hyperlipidemia, unspecified: Secondary | ICD-10-CM | POA: Diagnosis not present

## 2019-12-27 DIAGNOSIS — E1122 Type 2 diabetes mellitus with diabetic chronic kidney disease: Secondary | ICD-10-CM | POA: Diagnosis not present

## 2019-12-27 DIAGNOSIS — Z8719 Personal history of other diseases of the digestive system: Secondary | ICD-10-CM | POA: Diagnosis not present

## 2019-12-27 DIAGNOSIS — D509 Iron deficiency anemia, unspecified: Secondary | ICD-10-CM | POA: Diagnosis not present

## 2019-12-27 DIAGNOSIS — E559 Vitamin D deficiency, unspecified: Secondary | ICD-10-CM | POA: Diagnosis not present

## 2019-12-28 DIAGNOSIS — L821 Other seborrheic keratosis: Secondary | ICD-10-CM | POA: Diagnosis not present

## 2019-12-28 DIAGNOSIS — L570 Actinic keratosis: Secondary | ICD-10-CM | POA: Diagnosis not present

## 2019-12-28 DIAGNOSIS — Z85828 Personal history of other malignant neoplasm of skin: Secondary | ICD-10-CM | POA: Diagnosis not present

## 2019-12-28 DIAGNOSIS — L578 Other skin changes due to chronic exposure to nonionizing radiation: Secondary | ICD-10-CM | POA: Diagnosis not present

## 2020-02-16 ENCOUNTER — Other Ambulatory Visit: Payer: Self-pay

## 2020-02-16 ENCOUNTER — Inpatient Hospital Stay: Payer: Medicare Other | Attending: Hematology and Oncology | Admitting: Hematology and Oncology

## 2020-02-16 ENCOUNTER — Inpatient Hospital Stay: Payer: Medicare Other

## 2020-02-16 ENCOUNTER — Other Ambulatory Visit: Payer: Self-pay | Admitting: Hematology and Oncology

## 2020-02-16 VITALS — BP 151/62 | HR 93 | Temp 97.8°F | Resp 18 | Ht 66.0 in | Wt 148.2 lb

## 2020-02-16 DIAGNOSIS — R0602 Shortness of breath: Secondary | ICD-10-CM | POA: Insufficient documentation

## 2020-02-16 DIAGNOSIS — F418 Other specified anxiety disorders: Secondary | ICD-10-CM | POA: Diagnosis not present

## 2020-02-16 DIAGNOSIS — Z79899 Other long term (current) drug therapy: Secondary | ICD-10-CM | POA: Insufficient documentation

## 2020-02-16 DIAGNOSIS — D649 Anemia, unspecified: Secondary | ICD-10-CM | POA: Diagnosis not present

## 2020-02-16 DIAGNOSIS — E785 Hyperlipidemia, unspecified: Secondary | ICD-10-CM | POA: Insufficient documentation

## 2020-02-16 DIAGNOSIS — D509 Iron deficiency anemia, unspecified: Secondary | ICD-10-CM | POA: Insufficient documentation

## 2020-02-16 DIAGNOSIS — Z803 Family history of malignant neoplasm of breast: Secondary | ICD-10-CM | POA: Insufficient documentation

## 2020-02-16 DIAGNOSIS — D500 Iron deficiency anemia secondary to blood loss (chronic): Secondary | ICD-10-CM

## 2020-02-16 DIAGNOSIS — Z87891 Personal history of nicotine dependence: Secondary | ICD-10-CM | POA: Diagnosis not present

## 2020-02-16 DIAGNOSIS — Z87442 Personal history of urinary calculi: Secondary | ICD-10-CM | POA: Diagnosis not present

## 2020-02-16 DIAGNOSIS — K315 Obstruction of duodenum: Secondary | ICD-10-CM | POA: Insufficient documentation

## 2020-02-16 LAB — CMP (CANCER CENTER ONLY)
ALT: 16 U/L (ref 0–44)
AST: 18 U/L (ref 15–41)
Albumin: 3.7 g/dL (ref 3.5–5.0)
Alkaline Phosphatase: 46 U/L (ref 38–126)
Anion gap: 6 (ref 5–15)
BUN: 14 mg/dL (ref 8–23)
CO2: 31 mmol/L (ref 22–32)
Calcium: 9 mg/dL (ref 8.9–10.3)
Chloride: 104 mmol/L (ref 98–111)
Creatinine: 0.8 mg/dL (ref 0.61–1.24)
GFR, Estimated: >60 mL/min (ref >60)
Glucose, Bld: 123 mg/dL — ABNORMAL HIGH (ref 70–99)
Potassium: 4.4 mmol/L (ref 3.5–5.1)
Sodium: 141 mmol/L (ref 135–145)
Total Bilirubin: 0.4 mg/dL (ref 0.3–1.2)
Total Protein: 6.8 g/dL (ref 6.5–8.1)

## 2020-02-16 LAB — CBC WITH DIFFERENTIAL (CANCER CENTER ONLY)
Abs Immature Granulocytes: 0.05 10*3/uL (ref 0.00–0.07)
Basophils Absolute: 0 10*3/uL (ref 0.0–0.1)
Basophils Relative: 0 %
Eosinophils Absolute: 0.2 10*3/uL (ref 0.0–0.5)
Eosinophils Relative: 3 %
HCT: 33.1 % — ABNORMAL LOW (ref 39.0–52.0)
Hemoglobin: 11 g/dL — ABNORMAL LOW (ref 13.0–17.0)
Immature Granulocytes: 1 %
Lymphocytes Relative: 16 %
Lymphs Abs: 1.3 10*3/uL (ref 0.7–4.0)
MCH: 31.7 pg (ref 26.0–34.0)
MCHC: 33.2 g/dL (ref 30.0–36.0)
MCV: 95.4 fL (ref 80.0–100.0)
Monocytes Absolute: 0.8 10*3/uL (ref 0.1–1.0)
Monocytes Relative: 10 %
Neutro Abs: 5.3 10*3/uL (ref 1.7–7.7)
Neutrophils Relative %: 70 %
Platelet Count: 250 10*3/uL (ref 150–400)
RBC: 3.47 MIL/uL — ABNORMAL LOW (ref 4.22–5.81)
RDW: 13.9 % (ref 11.5–15.5)
WBC Count: 7.7 10*3/uL (ref 4.0–10.5)
nRBC: 0 % (ref 0.0–0.2)

## 2020-02-16 LAB — RETIC PANEL
Immature Retic Fract: 15.3 % (ref 2.3–15.9)
RBC.: 3.52 MIL/uL — ABNORMAL LOW (ref 4.22–5.81)
Retic Count, Absolute: 29.2 10*3/uL (ref 19.0–186.0)
Retic Ct Pct: 0.8 % (ref 0.4–3.1)
Reticulocyte Hemoglobin: 39.5 pg (ref >27.9)

## 2020-02-18 ENCOUNTER — Encounter: Payer: Self-pay | Admitting: Hematology and Oncology

## 2020-02-18 NOTE — Progress Notes (Signed)
[place] Telephone:([phone]) [phone]   Fax:(336) [phone]  PROGRESS NOTE  Patient Care Team: [name], MD as PCP - General (Internal Medicine)  Hematological/Oncological History #Normocytic Anemia #Iron Deficiency Anemia 1) 01/01/2017: WBC 5.1, Hgb 11.0, Plt 254, MCV 91.6 2) 12/07/2017: WBC 8.0, Hgb 11.9, Plt 352, MCV 91.4. Iron 76, TIBC 359, Sat 21%, Ferritin 31 3) 03/09/2019: WBC 14.3, Hgb 11.1, Plt 374, MCV 88.8 4) 05/22/2019: WBC 5.5, Hgb 10.7, Plt 311, MCV 83.7. Routine PCP visit. 5) 06/14/2019: establish care with Dr. Lorenso Courier. WBC 5.0, Hgb 10.3, MCV 84.9, Plt 320 6) 09/14/2019: WBC 6.2, Hgb 10.9, MCV 91.2, Plt 209 7)  11/01/2019: WBC 9.2, Hgb 12.6, MVC 97.2, Plt 299 8) 11/05/2019: patient admitted with SBO, instructed to stop PO iron indefinitely.   Interval History:  Steven [name] 85 y.o. male with medical history significant for iron deficiency anemia who presents for a follow up visit. The patient's last visit was on 11/16/2019 at which time his Hgb was declining modestly. In the interim since the last visit Steven [name] has had no further hospital admissions for an SBO.   On exam today Steven [name] notes he feels well overall today.  He has not had an SBO in the interim since our last visit.  He reports that his appetite has been good and his energy levels have been okay.  He reports that his weight is also been stable in the interim.  He notes that he is not restricted in any activities, though he does occasionally become short of breath when walking up a flight of stairs.  He was recently seen by his general practitioner and reports that visit went well as well.  He notes that he does eat some red meat in the form of beef and Lamb, but primarily he eats pork and chicken.  Right now he denies having any issues with fevers, chills, sweats, nausea, vomiting or diarrhea.  He denies seeing any overt signs of blood in his stool.  A full 10 point ROS is listed below.  MEDICAL HISTORY:   Past Medical History:  Diagnosis Date   Allergy    enviromental   Anemia 2017   Anxiety    Arthritis    ankylosing spodilitis   Blood transfusion without reported diagnosis    during surgery   Cataract    Depression    Dyspnea    with exertion - had Echo done 09/29/16   History of kidney stones    Hyperlipidemia    Intestinal obstruction ([place])    Pneumonia    as a child    SURGICAL HISTORY: Past Surgical History:  Procedure Laterality Date   ABDOMINAL SURGERY     had abcess   APPENDECTOMY     CHOLECYSTECTOMY     with lysis of adhesions   COLONOSCOPY     COLOSTOMY     COLOSTOMY REVERSAL     EYE SURGERY Left    scar tissue removed from cornea   EYE SURGERY Right    cataract surgery with lens implant   JOINT REPLACEMENT Right    hip  x 2 1999 and 2007   SHOULDER ARTHROSCOPY WITH ROTATOR CUFF REPAIR AND SUBACROMIAL DECOMPRESSION Left 03/02/2013   Procedure: LEFT SHOULDER ARTHROSCOPY WITH SUBACROMIAL DECOMPRESSION DISTAL CALVICLE RESECTION AND ROTATOR CUFF REPAIR ;  Surgeon: [name], MD;  Location: [place];  Service: Orthopedics;  Laterality: Left;   TOTAL HIP REVISION Right 11/06/2016   Procedure: TOTAL HIP REVISION OF  THE ACETABULAR COMPONENT;  Surgeon: [name], MD;  Location: [facility];  Service: Orthopedics;  Laterality: Right;    SOCIAL HISTORY: Social History   Socioeconomic History   Marital status: Married    Spouse name: Not on file   Number of children: 2   Years of education: Not on file   Highest education level: Not on file  Occupational History   Occupation: retired  Tobacco Use   Smoking status: Former Smoker   Smokeless tobacco: Never Used  Scientific laboratory technician Use: Never used  Substance and Sexual Activity   Alcohol use: Yes    Comment: 3 beers or glasses of wine a day--reports stopped ETOH 11/01/16    Drug use: No   Sexual activity: Never  Other Topics Concern   Not on file  Social History Narrative    Not on file   Social Determinants of Health   Financial Resource Strain: Low Risk    Difficulty of Paying Living Expenses: Not hard at all  Food Insecurity: No Food Insecurity   Worried About Charity fundraiser in the Last Year: Never true   [place] in the Last Year: Never true  Transportation Needs: No Transportation Needs   Lack of Transportation (Medical): No   Lack of Transportation (Non-Medical): No  Physical Activity: Insufficiently Active   Days of Exercise per Week: 5 days   Minutes of Exercise per Session: 10 min  Stress: No Stress Concern Present   Feeling of Stress : Not at all  Social Connections: Unknown   Frequency of Communication with Friends and Family: Patient refused   Frequency of Social Gatherings with Friends and Family: Patient refused   Attends Religious Services: Patient refused   Printmaker: Patient refused   Attends Music therapist: Patient refused   Marital Status: Patient refused  Human resources officer Violence: Not At Risk   Fear of Current or Ex-Partner: No   Emotionally Abused: No   Physically Abused: No   Sexually Abused: No    FAMILY HISTORY: Family History  Problem Relation Age of Onset   Heart attack Mother    Diabetes Mother    Breast cancer Mother    Heart attack Maternal Grandfather    Pancreatic cancer Brother    Colon cancer Neg Hx    Esophageal cancer Neg Hx    Stomach cancer Neg Hx     ALLERGIES:  is allergic to other, indomethacin, and lactose intolerance (gi).  MEDICATIONS:  Current Outpatient Medications  Medication Sig Dispense Refill   acetaminophen (TYLENOL) 650 MG CR tablet Take 650 mg by mouth every 8 (eight) hours.     Artificial Tear Solution (SOOTHE XP) SOLN Place 1 drop into both eyes 2 (two) times daily.     B Complex-C (B-COMPLEX WITH VITAMIN C) tablet Take by mouth.     Biotin 1 MG CAPS Take 1 capsule by mouth daily.       Calcium Carbonate-Vitamin D 600-400 MG-UNIT tablet Take 1 tablet by mouth daily.      cholecalciferol (VITAMIN D) 1000 units tablet Take 1,000 Units by mouth daily.     citalopram (CELEXA) 20 MG tablet Take 20 mg by mouth daily.      folic acid (FOLVITE) 1 MG tablet TAKE 3 TABLETS BY MOUTH DAILY.     ipratropium (ATROVENT) 0.06 % nasal spray Place 2 sprays into both nostrils 2 (two) times daily.     methotrexate (  RHEUMATREX) 2.5 MG tablet TAKE 8 TABLETS BY MOUTH ONCE A WEEK.     Multiple Vitamins-Minerals (PRESERVISION AREDS 2 PO) Take 1 tablet by mouth in the morning and at bedtime.     rosuvastatin (CRESTOR) 10 MG tablet Take 10 mg by mouth at bedtime.      No current facility-administered medications for this visit.    REVIEW OF SYSTEMS:   Constitutional: ( - ) fevers, ( - )  chills , ( - ) night sweats Eyes: ( - ) blurriness of vision, ( - ) double vision, ( - ) watery eyes Ears, nose, mouth, throat, and face: ( - ) mucositis, ( - ) sore throat Respiratory: ( - ) cough, ( - ) dyspnea, ( - ) wheezes Cardiovascular: ( - ) palpitation, ( - ) chest discomfort, ( - ) lower extremity swelling Gastrointestinal:  ( - ) nausea, ( - ) heartburn, ( - ) change in bowel habits Skin: ( - ) abnormal skin rashes Lymphatics: ( - ) new lymphadenopathy, ( - ) easy bruising Neurological: ( - ) numbness, ( - ) tingling, ( - ) new weaknesses Behavioral/Psych: ( - ) mood change, ( - ) new changes  All other systems were reviewed with the patient and are negative.  PHYSICAL EXAMINATION: ECOG PERFORMANCE STATUS: 1 - Symptomatic but completely ambulatory  Vitals:   02/16/20 1528  BP: (!) 151/62  Pulse: 93  Resp: 18  Temp: 97.8 F (36.6 C)  SpO2: 96%   Filed Weights   02/16/20 1528  Weight: 148 lb 3.2 oz (67.2 kg)    GENERAL: well appearing elderly Caucasian male in NAD  SKIN: skin color, texture, turgor are normal, no rashes or significant lesions EYES: conjunctiva are pink and  non-injected, sclera clear LUNGS: clear to auscultation and percussion with normal breathing effort HEART: regular rate & rhythm and no murmurs and no lower extremity edema ABDOMEN: soft, non-tender, non-distended, normal bowel sounds. No HSM appreciated. Musculoskeletal: no cyanosis of digits and no clubbing  PSYCH: alert & oriented x 3, fluent speech NEURO: no focal motor/sensory deficits  LABORATORY DATA:  I have reviewed the data as listed CBC Latest Ref Rng & Units 02/16/2020 11/16/2019 11/02/2019  WBC 4.0 - 10.5 K/uL 7.7 7.8 9.5  Hemoglobin 13.0 - 17.0 g/dL 11.0(L) 11.8(L) 12.4(L)  Hematocrit 39 - 52 % 33.1(L) 35.5(L) 37.8(L)  Platelets 150 - 400 K/uL 250 303 284    CMP Latest Ref Rng & Units 02/16/2020 11/16/2019 11/04/2019  Glucose 70 - 99 mg/dL 123(H) 107(H) 98  BUN 8 - 23 mg/dL 14 14 10   Creatinine 0.61 - 1.24 mg/dL 0.80 0.81 0.75  Sodium 135 - 145 mmol/L 141 137 138  Potassium 3.5 - 5.1 mmol/L 4.4 4.4 3.7  Chloride 98 - 111 mmol/L 104 103 101  CO2 22 - 32 mmol/L 31 29 28   Calcium 8.9 - 10.3 mg/dL 9.0 9.7 8.1(L)  Total Protein 6.5 - 8.1 g/dL 6.8 7.3 -  Total Bilirubin 0.3 - 1.2 mg/dL 0.4 0.7 -  Alkaline Phos 38 - 126 U/L 46 52 -  AST 15 - 41 U/L 18 21 -  ALT 0 - 44 U/L 16 17 -    No results found for: MPROTEIN Lab Results  Component Value Date   KPAFRELGTCHN 16.5 06/14/2019   LAMBDASER 14.3 06/14/2019   KAPLAMBRATIO 1.15 06/14/2019    RADIOGRAPHIC STUDIES: No results found.  Doniphan L [name] 85 y.o. male with medical history significant for iron  deficiency anemia who presents for a follow up visit.  After review of the labs and discussion with the patient the findings are most consistent with continued iron deficiency anemia.  On exam today Steven [name] notes that his baseline level of health with good energy levels and exercise tolerance.  His hemoglobin levels continue to drop but he has not noticed any overt signs of bleeding.  He had to stop his iron  pills, but does still eat meat predominantly in the form of pork and chicken.  Otherwise he is virtually asymptomatic at this time.  GI evaluations: EGD 03/11/2017: normal stomach and esophagus. Mild duodenal stenosis Colonoscopy 11/17/2013: moderate diverticulosis  #Normocytic Anemia -- steady decline in Hgb to 11.0, down from 11.8 on 11/16/2019 and 12.4 on 11/02/2019.  --patient has to stop his ferrous sulfate 325mg  PO daily due to recurrent SBOs, as recommended by his surgical team.  --repeat Iron panel, ferritin, and reticulocyte panel today.  --will recommend IV iron to the patient if iron levels remain low  --may need to consider further GI evaluation if Hgb declines again. Steven [name] previously had a positive FOB card --RTC in 3 months or sooner if IV iron is required .   No orders of the defined types were placed in this encounter.   All questions were answered. The patient knows to call the clinic with any problems, questions or concerns.  A total of more than 30 minutes were spent on this encounter and over half of that time was spent on counseling and coordination of care as outlined above.   Ledell Peoples, MD Department of Hematology/Oncology [place] [facility] Phone: [phone] Pager: [phone] Email: [email or web address]  02/18/2020 5:43 PM

## 2020-02-19 ENCOUNTER — Telehealth: Payer: Self-pay | Admitting: Hematology and Oncology

## 2020-02-19 LAB — IRON AND TIBC
Iron: 38 ug/dL — ABNORMAL LOW (ref 42–163)
Saturation Ratios: 10 % — ABNORMAL LOW (ref 20–55)
TIBC: 376 ug/dL (ref 202–409)
UIBC: 339 ug/dL (ref 117–376)

## 2020-02-19 LAB — FERRITIN: Ferritin: 22 ng/mL — ABNORMAL LOW (ref 24–336)

## 2020-02-19 NOTE — Telephone Encounter (Signed)
Scheduled per los. Called and left msg. Mailed printout  °

## 2020-02-28 ENCOUNTER — Emergency Department (HOSPITAL_COMMUNITY): Payer: Medicare Other

## 2020-02-28 ENCOUNTER — Encounter (HOSPITAL_COMMUNITY): Payer: Self-pay

## 2020-02-28 ENCOUNTER — Inpatient Hospital Stay (HOSPITAL_COMMUNITY)
Admission: EM | Admit: 2020-02-28 | Discharge: 2020-03-02 | DRG: 390 | Disposition: A | Discharge status: Home / Self Care | Payer: Medicare Other | Attending: Internal Medicine | Admitting: Internal Medicine

## 2020-02-28 ENCOUNTER — Other Ambulatory Visit: Payer: Self-pay

## 2020-02-28 DIAGNOSIS — Z9841 Cataract extraction status, right eye: Secondary | ICD-10-CM | POA: Diagnosis not present

## 2020-02-28 DIAGNOSIS — Z87891 Personal history of nicotine dependence: Secondary | ICD-10-CM

## 2020-02-28 DIAGNOSIS — K56600 Partial intestinal obstruction, unspecified as to cause: Secondary | ICD-10-CM | POA: Diagnosis not present

## 2020-02-28 DIAGNOSIS — Z961 Presence of intraocular lens: Secondary | ICD-10-CM | POA: Diagnosis not present

## 2020-02-28 DIAGNOSIS — Z8739 Personal history of other diseases of the musculoskeletal system and connective tissue: Secondary | ICD-10-CM

## 2020-02-28 DIAGNOSIS — Z803 Family history of malignant neoplasm of breast: Secondary | ICD-10-CM | POA: Diagnosis not present

## 2020-02-28 DIAGNOSIS — Z20822 Contact with and (suspected) exposure to covid-19: Secondary | ICD-10-CM | POA: Diagnosis present

## 2020-02-28 DIAGNOSIS — Z87442 Personal history of urinary calculi: Secondary | ICD-10-CM

## 2020-02-28 DIAGNOSIS — R197 Diarrhea, unspecified: Secondary | ICD-10-CM | POA: Diagnosis present

## 2020-02-28 DIAGNOSIS — R109 Unspecified abdominal pain: Secondary | ICD-10-CM | POA: Diagnosis not present

## 2020-02-28 DIAGNOSIS — D509 Iron deficiency anemia, unspecified: Secondary | ICD-10-CM | POA: Diagnosis not present

## 2020-02-28 DIAGNOSIS — M459 Ankylosing spondylitis of unspecified sites in spine: Secondary | ICD-10-CM | POA: Diagnosis not present

## 2020-02-28 DIAGNOSIS — Z800 Family history of malignant neoplasm of digestive organs: Secondary | ICD-10-CM

## 2020-02-28 DIAGNOSIS — Z8249 Family history of ischemic heart disease and other diseases of the circulatory system: Secondary | ICD-10-CM

## 2020-02-28 DIAGNOSIS — Z79899 Other long term (current) drug therapy: Secondary | ICD-10-CM

## 2020-02-28 DIAGNOSIS — E785 Hyperlipidemia, unspecified: Secondary | ICD-10-CM | POA: Diagnosis not present

## 2020-02-28 DIAGNOSIS — M1990 Unspecified osteoarthritis, unspecified site: Secondary | ICD-10-CM | POA: Diagnosis not present

## 2020-02-28 DIAGNOSIS — K565 Intestinal adhesions [bands], unspecified as to partial versus complete obstruction: Secondary | ICD-10-CM | POA: Diagnosis present

## 2020-02-28 DIAGNOSIS — F32A Depression, unspecified: Secondary | ICD-10-CM | POA: Diagnosis not present

## 2020-02-28 DIAGNOSIS — Z96641 Presence of right artificial hip joint: Secondary | ICD-10-CM | POA: Diagnosis present

## 2020-02-28 DIAGNOSIS — K5651 Intestinal adhesions [bands], with partial obstruction: Principal | ICD-10-CM | POA: Diagnosis present

## 2020-02-28 DIAGNOSIS — Z833 Family history of diabetes mellitus: Secondary | ICD-10-CM

## 2020-02-28 DIAGNOSIS — E739 Lactose intolerance, unspecified: Secondary | ICD-10-CM | POA: Diagnosis present

## 2020-02-28 DIAGNOSIS — Z9049 Acquired absence of other specified parts of digestive tract: Secondary | ICD-10-CM | POA: Diagnosis not present

## 2020-02-28 DIAGNOSIS — R1010 Upper abdominal pain, unspecified: Secondary | ICD-10-CM | POA: Diagnosis not present

## 2020-02-28 DIAGNOSIS — Z888 Allergy status to other drugs, medicaments and biological substances status: Secondary | ICD-10-CM | POA: Diagnosis not present

## 2020-02-28 DIAGNOSIS — E7800 Pure hypercholesterolemia, unspecified: Secondary | ICD-10-CM | POA: Diagnosis not present

## 2020-02-28 DIAGNOSIS — K56609 Unspecified intestinal obstruction, unspecified as to partial versus complete obstruction: Secondary | ICD-10-CM | POA: Diagnosis not present

## 2020-02-28 DIAGNOSIS — E782 Mixed hyperlipidemia: Secondary | ICD-10-CM | POA: Diagnosis present

## 2020-02-28 LAB — COMPREHENSIVE METABOLIC PANEL
ALT: 18 U/L (ref 0–44)
AST: 20 U/L (ref 15–41)
Albumin: 4.1 g/dL (ref 3.5–5.0)
Alkaline Phosphatase: 46 U/L (ref 38–126)
Anion gap: 10 (ref 5–15)
BUN: 12 mg/dL (ref 8–23)
CO2: 25 mmol/L (ref 22–32)
Calcium: 8.8 mg/dL — ABNORMAL LOW (ref 8.9–10.3)
Chloride: 99 mmol/L (ref 98–111)
Creatinine, Ser: 0.77 mg/dL (ref 0.61–1.24)
GFR, Estimated: >60 mL/min (ref >60)
Glucose, Bld: 132 mg/dL — ABNORMAL HIGH (ref 70–99)
Potassium: 4.4 mmol/L (ref 3.5–5.1)
Sodium: 134 mmol/L — ABNORMAL LOW (ref 135–145)
Total Bilirubin: 1.3 mg/dL — ABNORMAL HIGH (ref 0.3–1.2)
Total Protein: 7.4 g/dL (ref 6.5–8.1)

## 2020-02-28 LAB — URINALYSIS, ROUTINE W REFLEX MICROSCOPIC
Bilirubin Urine: NEGATIVE
Glucose, UA: NEGATIVE mg/dL
Hgb urine dipstick: NEGATIVE
Ketones, ur: 20 mg/dL — AB
Leukocytes,Ua: NEGATIVE
Nitrite: NEGATIVE
Protein, ur: NEGATIVE mg/dL
Specific Gravity, Urine: 1.014 (ref 1.005–1.030)
pH: 5 (ref 5.0–8.0)

## 2020-02-28 LAB — CBC
HCT: 36.5 % — ABNORMAL LOW (ref 39.0–52.0)
Hemoglobin: 11.9 g/dL — ABNORMAL LOW (ref 13.0–17.0)
MCH: 31.7 pg (ref 26.0–34.0)
MCHC: 32.6 g/dL (ref 30.0–36.0)
MCV: 97.3 fL (ref 80.0–100.0)
Platelets: 264 10*3/uL (ref 150–400)
RBC: 3.75 MIL/uL — ABNORMAL LOW (ref 4.22–5.81)
RDW: 14.3 % (ref 11.5–15.5)
WBC: 6.8 10*3/uL (ref 4.0–10.5)
nRBC: 0 % (ref 0.0–0.2)

## 2020-02-28 LAB — LIPASE, BLOOD: Lipase: 37 U/L (ref 11–51)

## 2020-02-28 MED ORDER — IOHEXOL 300 MG/ML  SOLN
100.0000 mL | Freq: Once | INTRAMUSCULAR | Status: AC | PRN
  Administered 2020-02-28: 100 mL via INTRAVENOUS

## 2020-02-28 MED ORDER — SODIUM CHLORIDE 0.9 % IV BOLUS
1000.0000 mL | Freq: Once | INTRAVENOUS | Status: AC
  Administered 2020-02-28: 1000 mL via INTRAVENOUS

## 2020-02-28 NOTE — ED Provider Notes (Addendum)
[facility] DEPT Provider Note   CSN: [id number] Arrival date & time: 02/28/20  1639     History Chief Complaint  Patient presents with   Abdominal Pain    Steven [name] is a 85 y.o. male with a recurrent SBO, ankylosing spondylitis of lumbrosacral region, iron deficiency anemia, kidney stones who presents to the Emergency Department from his PCP's office with a chief complaint of abdominal pain.  The patient reports sudden onset, 10/10, diffuse abdominal pain that began 2 nights ago. He notes that his abdomen was hard and distended and felt similar to previous bowel obstructions. Pain has been persistent since onset, but has somewhat improved. Currently 5/10. He reports that his abdominal is worse with palpation. No other known aggravating or alleviating factors. He feels that his abdomen that his abdomen feels somewhat less distended and firm than two nights ago. He had one episode of non-bloody vomiting last night after attempting to eat soup, but no further episodes or nausea. No fever, chills, SOB, chest pain, back pain, dysuria, hematuria, or diarrhea. Last BM was yesterday and was normal per the patient. He has been passing flatus. He has had significantly decreased PO intake over the last 48 hours due to pain.   He was seen by his PCP earlier today and had an abdominal X-ray that was concerning for a developing SBO, and he was advised to come to the ER for further evaluation. He has a history of previous abdominal surgery 2/2 to bowel obstruction.   The history is provided by the patient and medical records. No language interpreter was used.       Past Medical History:  Diagnosis Date   Allergy    enviromental   Anemia 2017   Anxiety    Arthritis    ankylosing spodilitis   Blood transfusion without reported diagnosis    during surgery   Cataract    Depression    Dyspnea    with exertion - had Echo done 09/29/16   History of kidney  stones    Hyperlipidemia    Intestinal obstruction ([place])    Pneumonia    as a child    Patient Active Problem List   Diagnosis Date Noted   Partial small bowel obstruction ([place]) 02/29/2020   Hypokalemia    Hypomagnesemia    Ankylosing spondylitis of lumbosacral region [facility])    Depression    Small bowel obstruction ([place]) 03/09/2019   Anemia 12/31/2017   Failed arthroplasty ([place]) 11/06/2016   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   Dyspnea on exertion 09/11/2016   SBO (small bowel obstruction) ([place]) 03/21/2015   Abdominal pain 04/10/2014   Bowel obstruction ([place]) 04/10/2014   Hyperlipidemia 04/10/2014   History of ankylosing spondylitis 04/10/2014    Past Surgical History:  Procedure Laterality Date   ABDOMINAL SURGERY     had abcess   APPENDECTOMY     CHOLECYSTECTOMY     with lysis of adhesions   COLONOSCOPY     COLOSTOMY     COLOSTOMY REVERSAL     EYE SURGERY Left    scar tissue removed from cornea   EYE SURGERY Right    cataract surgery with lens implant   JOINT REPLACEMENT Right    hip  x 2 1999 and 2007   SHOULDER ARTHROSCOPY WITH ROTATOR CUFF REPAIR AND SUBACROMIAL DECOMPRESSION Left 03/02/2013   Procedure: LEFT SHOULDER ARTHROSCOPY WITH SUBACROMIAL DECOMPRESSION DISTAL CALVICLE RESECTION AND ROTATOR CUFF REPAIR ;  Surgeon:  [name], MD;  Location: [facility];  Service: Orthopedics;  Laterality: Left;   TOTAL HIP REVISION Right 11/06/2016   Procedure: TOTAL HIP REVISION OF THE ACETABULAR COMPONENT;  Surgeon: [name], MD;  Location: [facility];  Service: Orthopedics;  Laterality: Right;       Family History  Problem Relation Age of Onset   Heart attack Mother    Diabetes Mother    Breast cancer Mother    Heart attack Maternal Grandfather    Pancreatic cancer Brother    Colon cancer Neg Hx    Esophageal cancer Neg Hx    Stomach cancer Neg Hx     Social History   Tobacco Use   Smoking status: Former  Smoker   Smokeless tobacco: Never Used  Scientific laboratory technician Use: Never used  Substance Use Topics   Alcohol use: Yes    Comment: 3 beers or glasses of wine a day--reports stopped ETOH 11/01/16    Drug use: No    Home Medications Prior to Admission medications   Medication Sig Start Date End Date Taking? Authorizing Provider  acetaminophen (TYLENOL) 650 MG CR tablet Take 650 mg by mouth every 8 (eight) hours.   Yes [provider]  Artificial Tear Solution (SOOTHE XP) SOLN Place 1 drop into both eyes 2 (two) times daily.   Yes [provider]  B Complex-C (B-COMPLEX WITH VITAMIN C) tablet Take 1 tablet by mouth daily.    Yes [provider]  Biotin 1 MG CAPS Take 1 capsule by mouth daily.    Yes [provider]  Calcium Carbonate-Vitamin D 600-400 MG-UNIT tablet Take 1 tablet by mouth 2 (two) times daily.    Yes [provider]  cholecalciferol (VITAMIN D) 1000 units tablet Take 1,000 Units by mouth daily.   Yes [provider]  citalopram (CELEXA) 20 MG tablet Take 20 mg by mouth daily.    Yes [provider]  folic acid (FOLVITE) 1 MG tablet Take 1 mg by mouth in the morning, at noon, and at bedtime.   Yes [provider]  ipratropium (ATROVENT) 0.06 % nasal spray Place 2 sprays into both nostrils 2 (two) times daily. 06/05/19  Yes [provider]  methotrexate (RHEUMATREX) 2.5 MG tablet TAKE 8 TABLETS BY MOUTH ONCE A WEEK.   Yes [provider]  Multiple Vitamins-Minerals (PRESERVISION AREDS 2 PO) Take 1 tablet by mouth in the morning and at bedtime.   Yes [provider]  rosuvastatin (CRESTOR) 10 MG tablet Take 10 mg by mouth at bedtime.  08/16/19  Yes [provider]    Allergies    Other, Indomethacin, and Lactose intolerance (gi)  Review of Systems   Review of Systems  Constitutional: Negative for appetite change, chills and fever.  Eyes: Negative for visual disturbance.   Respiratory: Negative for cough, shortness of breath and wheezing.   Cardiovascular: Negative for chest pain.  Gastrointestinal: Positive for abdominal pain, nausea and vomiting (resolved). Negative for anal bleeding, blood in stool, diarrhea and rectal pain.  Genitourinary: Negative for dysuria, flank pain and urgency.  Musculoskeletal: Negative for back pain.  Skin: Negative for rash.  Allergic/Immunologic: Negative for immunocompromised state.  Neurological: Negative for dizziness, seizures, weakness, numbness and headaches.  Psychiatric/Behavioral: Negative for confusion.    Physical Exam Updated Vital Signs BP (!) 143/62 (BP Location: Left Arm)    Pulse 70    Temp 97.6 F (36.4 C) (Oral)    Resp  16    SpO2 98%   Physical Exam Vitals and nursing note reviewed.  Constitutional:      General: He is not in acute distress.    Appearance: He is well-developed. He is not ill-appearing, toxic-appearing or diaphoretic.     Comments: Pleasant, jovial, well-appearing, no acute distress.  HENT:     Head: Normocephalic.  Eyes:     Conjunctiva/sclera: Conjunctivae normal.  Cardiovascular:     Rate and Rhythm: Normal rate and regular rhythm.     Pulses: Normal pulses.     Heart sounds: Normal heart sounds. No murmur heard.  No friction rub. No gallop.   Pulmonary:     Effort: Pulmonary effort is normal. No respiratory distress.     Breath sounds: No stridor. No wheezing, rhonchi or rales.  Chest:     Chest wall: No tenderness.  Abdominal:     General: Bowel sounds are decreased. There is distension.     Palpations: Abdomen is soft.     Tenderness: There is generalized abdominal tenderness. There is no right CVA tenderness, left CVA tenderness, guarding or rebound. Negative signs include Murphy's sign, Rovsing's sign and McBurney's sign.     Comments: Multiple well-healed surgical scars noted to the abdominal wall.  Abdomen is moderately distended, but soft.  Minimal bowel sounds noted  throughout.  No rebound or guarding.  He is diffusely tender to palpation.  Musculoskeletal:     Cervical back: Neck supple.     Right lower leg: No edema.     Left lower leg: No edema.  Skin:    General: Skin is warm and dry.     Coloration: Skin is not jaundiced.  Neurological:     Mental Status: He is alert.  Psychiatric:        Behavior: Behavior normal.     ED Results / Procedures / Treatments   Labs (all labs ordered are listed, but only abnormal results are displayed) Labs Reviewed  COMPREHENSIVE METABOLIC PANEL - Abnormal; Notable for the following components:      Result Value   Sodium 134 (*)    Glucose, Bld 132 (*)    Calcium 8.8 (*)    Total Bilirubin 1.3 (*)    All other components within normal limits  CBC - Abnormal; Notable for the following components:   RBC 3.75 (*)    Hemoglobin 11.9 (*)    HCT 36.5 (*)    All other components within normal limits  URINALYSIS, ROUTINE W REFLEX MICROSCOPIC - Abnormal; Notable for the following components:   Ketones, ur 20 (*)    All other components within normal limits  RESPIRATORY PANEL BY RT PCR (FLU A&B, COVID)  LIPASE, BLOOD  CBC  CREATININE, SERUM  COMPREHENSIVE METABOLIC PANEL  CBC    EKG None  Radiology CT ABDOMEN PELVIS W CONTRAST  Result Date: 02/29/2020 CLINICAL DATA:  85 year old male with diffuse abdominal pain. EXAM: CT ABDOMEN AND PELVIS WITH CONTRAST TECHNIQUE: Multidetector CT imaging of the abdomen and pelvis was performed using the standard protocol following bolus administration of intravenous contrast. CONTRAST:  167mL OMNIPAQUE IOHEXOL 300 MG/ML  SOLN COMPARISON:  CT Abdomen and Pelvis 11/02/2019. FINDINGS: Lower chest: Stable, negative. Hepatobiliary: Absent gallbladder as before.  Negative liver. Pancreas: Negative. Spleen: Negative. Adrenals/Urinary Tract: Normal adrenal glands. Stable right greater than left nephrolithiasis. No obstructive uropathy. Symmetric renal enhancement and contrast  excretion. Urinary bladder partially obscured by streak artifact from the right hip arthroplasty but appears grossly normal.  Stable pelvic phleboliths. Stomach/Bowel: Redundant distal large bowel with retained stool increased since July. In the left lower quadrant there is a large bowel anastomosis (series 2, images 43 and 44) with no adverse features. Upstream of the anastomosis the colon is mostly decompressed, also contains some fluid. The terminal ileum and small bowel loops in the right lower quadrant are decompressed. In the left abdomen small bowel loops containing air in fluid are mildly dilated up to 36 mm. There is a small bowel anastomosis in the left anterior abdomen on series 2, image 42. And a gradual transition to nondilated small bowel appears to occur in the left lower quadrant. No free air. No free fluid. Stomach and duodenum appear within normal limits. Vascular/Lymphatic: Aortoiliac calcified atherosclerosis. Stable mildly tortuous abdominal aorta. Major arterial structures appear patent. Portal venous system is patent. No lymphadenopathy. Reproductive: Negative. Other: No pelvic free fluid. Musculoskeletal: Diffuse spinal ankylosis. Ankylosed SI joints, favor ankylosing spondylitis. Chronic right hip arthroplasty. No acute osseous abnormality identified. IMPRESSION: 1. Small and large bowel anastomoses in the left lower quadrant where a relative transition from mildly dilated to decompressed small bowel occurs suspicious for a partial small bowel obstruction. No free air, free fluid, or other complicating features. 2. Otherwise stable CT Abdomen and Pelvis including Ankylosing Spondylitis, nephrolithiasis, Calcified aortic atherosclerosis. A written preliminary report of this exam during a planned IT downtime was issued by Dr. Jesse Fall at 2306 hours. Electronically Signed   By: Genevie Ann M.D.   On: 02/29/2020 01:42    Procedures Procedures (including critical care time)  Medications Ordered  in ED Medications  enoxaparin (LOVENOX) injection 40 mg (has no administration in time range)  dextrose 5 % in lactated ringers infusion (has no administration in time range)  HYDROmorphone (DILAUDID) injection 0.5-1 mg (has no administration in time range)  ondansetron (ZOFRAN) tablet 4 mg (has no administration in time range)    Or  ondansetron (ZOFRAN) injection 4 mg (has no administration in time range)  iohexol (OMNIPAQUE) 300 MG/ML solution 100 mL (100 mLs Intravenous Contrast Given 02/28/20 2302)  sodium chloride 0.9 % bolus 1,000 mL (0 mLs Intravenous Stopped 02/29/20 0132)    ED Course  I have reviewed the triage vital signs and the nursing notes.  Pertinent labs & imaging results that were available during my care of the patient were reviewed by me and considered in my medical decision making (see chart for details).    MDM Rules/Calculators/A&P                          This is a 85 y.o. male who presents to the ED for concern of  abdominal pain.   This involves an extensive number of treatment options, and is a complaint that carries with it a high risk of complications and morbidity.  The differential diagnosis includes bowel obstruction, mesenteric ischemia, diverticulitis, diverticular bleed, appendicitis, cholecystitis, pyelonephritis, obstructive uropathy.   The patient was seen and independently evaluated by Dr. Randal Buba, attending physician.  Vitals and Exam:    Vital signs are stable.  Abdomen is moderately distended and diffusely tender to palpation with hypoactive bowel sounds.   Lab Tests:    I ordered, reviewed, and interpreted labs, which included  CBC, CMP, lipase, UA, and COVID-19 test that showed reassuring labs.  COVID-19 test is negative.  He has mild ketonuria, likely secondary to poor p.o. intake over the last 48 hours.  Imaging Studies ordered:  I ordered imaging studies which included  CT abdomen pelvis  I independently visualized and  interpreted imaging is suspicious for partial small bowel obstruction.  There is a small and large bowel anastomoses in the left lower quadrant where a relative transition point from a mildly dilated to decompressed small bowel occurs.  No free air, free fluid, or other complicating features.   Additional history obtained:    Previous records obtained and reviewed  Medicines ordered:    I ordered a normal saline bolus.    Consultations Obtained:    I consulted  the hospitalist team and Dr. Mcarthur Rossetti will accept the patient for admission.   Reevaluation:   After the interventions stated above, I reevaluated the patient and found improved  Plan and Disposition:   Admit to the hospitalist service.   The patient appears reasonably stabilized for admission considering the current resources, flow, and capabilities available in the ED at this time, and I doubt any other [facility] requiring further screening and/or treatment in the ED prior to admission.   Final Clinical Impression(s) / ED Diagnoses Final diagnoses:  Partial small bowel obstruction [facility])    Rx / DC Orders ED Discharge Orders    None       Joanne Gavel, PA-C 02/29/20 0140    Palumbo, April, MD 02/29/20 0217    Joanne Gavel, PA-C 02/29/20 0263    Randal Buba, April, MD 02/29/20 0410

## 2020-02-28 NOTE — ED Triage Notes (Signed)
Pt arrived via walk in,c/o diffuse abd pain. Was seen at PCP earlier for same, dx with developing SBO. Pt has hx of such. Some vomiting last night. Denies any other sx.

## 2020-02-29 DIAGNOSIS — Z961 Presence of intraocular lens: Secondary | ICD-10-CM | POA: Diagnosis present

## 2020-02-29 DIAGNOSIS — K56600 Partial intestinal obstruction, unspecified as to cause: Secondary | ICD-10-CM

## 2020-02-29 DIAGNOSIS — Z833 Family history of diabetes mellitus: Secondary | ICD-10-CM | POA: Diagnosis not present

## 2020-02-29 DIAGNOSIS — Z800 Family history of malignant neoplasm of digestive organs: Secondary | ICD-10-CM | POA: Diagnosis not present

## 2020-02-29 DIAGNOSIS — R197 Diarrhea, unspecified: Secondary | ICD-10-CM | POA: Diagnosis present

## 2020-02-29 DIAGNOSIS — D509 Iron deficiency anemia, unspecified: Secondary | ICD-10-CM | POA: Diagnosis present

## 2020-02-29 DIAGNOSIS — R1084 Generalized abdominal pain: Secondary | ICD-10-CM

## 2020-02-29 DIAGNOSIS — K565 Intestinal adhesions [bands], unspecified as to partial versus complete obstruction: Secondary | ICD-10-CM | POA: Diagnosis not present

## 2020-02-29 DIAGNOSIS — Z79899 Other long term (current) drug therapy: Secondary | ICD-10-CM | POA: Diagnosis not present

## 2020-02-29 DIAGNOSIS — E785 Hyperlipidemia, unspecified: Secondary | ICD-10-CM | POA: Diagnosis present

## 2020-02-29 DIAGNOSIS — E739 Lactose intolerance, unspecified: Secondary | ICD-10-CM | POA: Diagnosis present

## 2020-02-29 DIAGNOSIS — Z803 Family history of malignant neoplasm of breast: Secondary | ICD-10-CM | POA: Diagnosis not present

## 2020-02-29 DIAGNOSIS — Z888 Allergy status to other drugs, medicaments and biological substances status: Secondary | ICD-10-CM | POA: Diagnosis not present

## 2020-02-29 DIAGNOSIS — K5651 Intestinal adhesions [bands], with partial obstruction: Secondary | ICD-10-CM | POA: Diagnosis present

## 2020-02-29 DIAGNOSIS — M459 Ankylosing spondylitis of unspecified sites in spine: Secondary | ICD-10-CM | POA: Diagnosis present

## 2020-02-29 DIAGNOSIS — Z9841 Cataract extraction status, right eye: Secondary | ICD-10-CM | POA: Diagnosis not present

## 2020-02-29 DIAGNOSIS — Z96641 Presence of right artificial hip joint: Secondary | ICD-10-CM | POA: Diagnosis present

## 2020-02-29 DIAGNOSIS — Z8249 Family history of ischemic heart disease and other diseases of the circulatory system: Secondary | ICD-10-CM | POA: Diagnosis not present

## 2020-02-29 DIAGNOSIS — M1990 Unspecified osteoarthritis, unspecified site: Secondary | ICD-10-CM | POA: Diagnosis present

## 2020-02-29 DIAGNOSIS — Z20822 Contact with and (suspected) exposure to covid-19: Secondary | ICD-10-CM | POA: Diagnosis present

## 2020-02-29 DIAGNOSIS — R1010 Upper abdominal pain, unspecified: Secondary | ICD-10-CM | POA: Diagnosis not present

## 2020-02-29 DIAGNOSIS — F32A Depression, unspecified: Secondary | ICD-10-CM | POA: Diagnosis present

## 2020-02-29 DIAGNOSIS — Z8739 Personal history of other diseases of the musculoskeletal system and connective tissue: Secondary | ICD-10-CM | POA: Diagnosis not present

## 2020-02-29 DIAGNOSIS — Z9049 Acquired absence of other specified parts of digestive tract: Secondary | ICD-10-CM | POA: Diagnosis not present

## 2020-02-29 DIAGNOSIS — Z87442 Personal history of urinary calculi: Secondary | ICD-10-CM | POA: Diagnosis not present

## 2020-02-29 DIAGNOSIS — Z87891 Personal history of nicotine dependence: Secondary | ICD-10-CM | POA: Diagnosis not present

## 2020-02-29 LAB — CBC
HCT: 33.3 % — ABNORMAL LOW (ref 39.0–52.0)
Hemoglobin: 10.7 g/dL — ABNORMAL LOW (ref 13.0–17.0)
MCH: 31.8 pg (ref 26.0–34.0)
MCHC: 32.1 g/dL (ref 30.0–36.0)
MCV: 98.8 fL (ref 80.0–100.0)
Platelets: 234 10*3/uL (ref 150–400)
RBC: 3.37 MIL/uL — ABNORMAL LOW (ref 4.22–5.81)
RDW: 14.3 % (ref 11.5–15.5)
WBC: 5.4 10*3/uL (ref 4.0–10.5)
nRBC: 0 % (ref 0.0–0.2)

## 2020-02-29 LAB — COMPREHENSIVE METABOLIC PANEL
ALT: 16 U/L (ref 0–44)
AST: 16 U/L (ref 15–41)
Albumin: 3.5 g/dL (ref 3.5–5.0)
Alkaline Phosphatase: 37 U/L — ABNORMAL LOW (ref 38–126)
Anion gap: 11 (ref 5–15)
BUN: 11 mg/dL (ref 8–23)
CO2: 24 mmol/L (ref 22–32)
Calcium: 8.1 mg/dL — ABNORMAL LOW (ref 8.9–10.3)
Chloride: 99 mmol/L (ref 98–111)
Creatinine, Ser: 0.69 mg/dL (ref 0.61–1.24)
GFR, Estimated: >60 mL/min (ref >60)
Glucose, Bld: 121 mg/dL — ABNORMAL HIGH (ref 70–99)
Potassium: 3.5 mmol/L (ref 3.5–5.1)
Sodium: 134 mmol/L — ABNORMAL LOW (ref 135–145)
Total Bilirubin: 1.2 mg/dL (ref 0.3–1.2)
Total Protein: 6.3 g/dL — ABNORMAL LOW (ref 6.5–8.1)

## 2020-02-29 LAB — RESPIRATORY PANEL BY RT PCR (FLU A&B, COVID)
Influenza A by PCR: NEGATIVE
Influenza B by PCR: NEGATIVE
SARS Coronavirus 2 by RT PCR: NEGATIVE

## 2020-02-29 LAB — CREATININE, SERUM
Creatinine, Ser: 0.73 mg/dL (ref 0.61–1.24)
GFR, Estimated: >60 mL/min (ref >60)

## 2020-02-29 MED ORDER — ENOXAPARIN SODIUM 40 MG/0.4ML ~~LOC~~ SOLN
40.0000 mg | SUBCUTANEOUS | Status: DC
  Administered 2020-02-29 – 2020-03-02 (×3): 40 mg via SUBCUTANEOUS
  Filled 2020-02-29 (×3): qty 0.4

## 2020-02-29 MED ORDER — ONDANSETRON HCL 4 MG PO TABS: 4.0000 mg | ORAL_TABLET | Freq: Four times a day (QID) | ORAL | Status: DC | PRN

## 2020-02-29 MED ORDER — ONDANSETRON HCL 4 MG/2ML IJ SOLN: 4.0000 mg | Freq: Four times a day (QID) | INTRAMUSCULAR | Status: DC | PRN

## 2020-02-29 MED ORDER — HYDROMORPHONE HCL 1 MG/ML IJ SOLN: 0.5000 mg | INTRAMUSCULAR | Status: DC | PRN

## 2020-02-29 MED ORDER — CITALOPRAM HYDROBROMIDE 20 MG PO TABS
20.0000 mg | ORAL_TABLET | Freq: Every day | ORAL | Status: DC
  Administered 2020-02-29 – 2020-03-02 (×3): 20 mg via ORAL
  Filled 2020-02-29 (×3): qty 1

## 2020-02-29 MED ORDER — DEXTROSE IN LACTATED RINGERS 5 % IV SOLN
INTRAVENOUS | Status: DC
  Filled 2020-02-29 (×4): qty 1000

## 2020-02-29 NOTE — Progress Notes (Signed)
85 year old gentleman with history of colon resection for perforation more than 20 years ago, recurrent small bowel obstruction, third hospitalization in last 1 year, hypertension hyperlipidemia presented to the ER with abdominal distention, nausea vomiting for about 3 days.  Patient had similar symptoms last 2 admissions and was treated conservatively.  He started having some bloating sensation 3 days ago and he was out of town.  He waited to see if that improves by itself.  By the time he came to ER, his belly was already improving and nausea has improved.  In the emergency room hemodynamically stable.  CT scan abdomen pelvis showed intact anastomosis with some evidence of collapsed a small intestine consistent with partial small bowel obstruction.  Patient seen and examined.  No overnight events.  Admitted early morning hours by nighttime hospitalist.  Patient currently denies any complaints.  He went to bathroom earlier morning and had a small amount of loose stool.  Denies any abdominal pain or bloating.  Denies any nausea vomiting.  Plan: Partial small bowel obstruction, resolving with returning bowel function. Will allow clears, with history of multiple adhesions and small bowel obstruction, will very slow advance diet. Increase mobility.  Continue IV fluids today. If adequate bowel function, can probably go home tomorrow morning on full liquid diet. We will recheck electrolytes including magnesium and phosphorus in the morning.

## 2020-02-29 NOTE — H&P (Signed)
History and Physical   Steven [name] UJW:[id number] DOB: 1934-12-16 DOA: 02/28/2020  Referring MD/NP/PA: Dr. Eulis Foster  PCP: [name], MD   Outpatient Specialists: Dr. Narda Rutherford, oncology  Patient coming from: Home  Chief Complaint: Abdominal pain nausea vomiting  HPI: Steven [name] is a 85 y.o. male with medical history significant of recurrent small bowel obstruction with last hospitalization in July of this year at [facility] depression, hyperlipidemia, osteoarthritis and ankylosing spondylitis status post previous surgeries who came to the ER today with abdominal distention nausea vomiting and diarrhea. Patient is symptoms are similar to his previous small bowel obstructions but not as intense. He is still able to pass gas and has had a bowel movement but noticed the worsening distention. Also reported one episode of vomiting but has not vomited since. Patient seen in the ER and evaluated. He appears to have partial small bowel obstruction. NG tube not inserted secondary to no ongoing vomiting. Patient however is admitted for observation and management of his partial small bowel obstruction. Denied any hematemesis no bright red blood per rectum. Patient denied any recent trauma or change in his diet. No sick contact..  ED Course: Temperature 99.2 blood pressure 151/78 pulse 80 respirate of 24 and oxygen sats 95% on room air. White count 5.4 hemoglobin 10.7 platelets 234 sodium 134 potassium 3.5 the rest of the chemistry appears to be within normal. Urinalysis essentially negative acute viral assays also negative. CT abdomen pelvis shows small and large bowel anastomosis in the left lower quadrant with some evidence of partial small bowel obstruction. Patient being admitted for evaluation and supportive care.  Review of Systems: As per HPI otherwise 10 point review of systems negative.    Past Medical History:  Diagnosis Date  . Allergy    enviromental  . Anemia 2017  . Anxiety    . Arthritis    ankylosing spodilitis  . Blood transfusion without reported diagnosis    during surgery  . Cataract   . Depression   . Dyspnea    with exertion - had Echo done 09/29/16  . History of kidney stones   . Hyperlipidemia   . Intestinal obstruction ([place])   . Pneumonia    as a child    Past Surgical History:  Procedure Laterality Date  . ABDOMINAL SURGERY     had abcess  . APPENDECTOMY    . CHOLECYSTECTOMY     with lysis of adhesions  . COLONOSCOPY    . COLOSTOMY    . COLOSTOMY REVERSAL    . EYE SURGERY Left    scar tissue removed from cornea  . EYE SURGERY Right    cataract surgery with lens implant  . JOINT REPLACEMENT Right    hip  x 2 1999 and 2007  . SHOULDER ARTHROSCOPY WITH ROTATOR CUFF REPAIR AND SUBACROMIAL DECOMPRESSION Left 03/02/2013   Procedure: LEFT SHOULDER ARTHROSCOPY WITH SUBACROMIAL DECOMPRESSION DISTAL CALVICLE RESECTION AND ROTATOR CUFF REPAIR ;  Surgeon: [name], MD;  Location: [facility];  Service: Orthopedics;  Laterality: Left;  . TOTAL HIP REVISION Right 11/06/2016   Procedure: TOTAL HIP REVISION OF THE ACETABULAR COMPONENT;  Surgeon: [name], MD;  Location: [place];  Service: Orthopedics;  Laterality: Right;     reports that he has quit smoking. He has never used smokeless tobacco. He reports current alcohol use. He reports that he does not use drugs.  Allergies  Allergen Reactions  . Other Hives  . Indomethacin Hives  .  Lactose Intolerance (Gi)     GI UPSET    Family History  Problem Relation Age of Onset  . Heart attack Mother   . Diabetes Mother   . Breast cancer Mother   . Heart attack Maternal Grandfather   . Pancreatic cancer Brother   . Colon cancer Neg Hx   . Esophageal cancer Neg Hx   . Stomach cancer Neg Hx      Prior to Admission medications   Medication Sig Start Date End Date Taking? Authorizing Provider  acetaminophen (TYLENOL) 650 MG CR tablet Take 650 mg by mouth every 8 (eight) hours.   Yes  [provider]  Artificial Tear Solution (SOOTHE XP) SOLN Place 1 drop into both eyes 2 (two) times daily.   Yes [provider]  B Complex-C (B-COMPLEX WITH VITAMIN C) tablet Take 1 tablet by mouth daily.    Yes [provider]  Biotin 1 MG CAPS Take 1 capsule by mouth daily.    Yes [provider]  Calcium Carbonate-Vitamin D 600-400 MG-UNIT tablet Take 1 tablet by mouth 2 (two) times daily.    Yes [provider]  cholecalciferol (VITAMIN D) 1000 units tablet Take 1,000 Units by mouth daily.   Yes [provider]  citalopram (CELEXA) 20 MG tablet Take 20 mg by mouth daily.    Yes [provider]  folic acid (FOLVITE) 1 MG tablet Take 1 mg by mouth in the morning, at noon, and at bedtime.   Yes [provider]  ipratropium (ATROVENT) 0.06 % nasal spray Place 2 sprays into both nostrils 2 (two) times daily. 06/05/19  Yes [provider]  methotrexate (RHEUMATREX) 2.5 MG tablet TAKE 8 TABLETS BY MOUTH ONCE A WEEK.   Yes [provider]  Multiple Vitamins-Minerals (PRESERVISION AREDS 2 PO) Take 1 tablet by mouth in the morning and at bedtime.   Yes [provider]  rosuvastatin (CRESTOR) 10 MG tablet Take 10 mg by mouth at bedtime.  08/16/19  Yes [provider]    Physical Exam: Vitals:   02/28/20 1649 02/28/20 2044 02/28/20 2130 02/28/20 2230  BP: (!) 151/78 125/62 127/65 123/64  Pulse: 80 70 70 73  Resp: 16 14 10  (!) 21  Temp: 97.8 F (36.6 C)     TempSrc: Oral     SpO2: 98% 100% 96% 96%      Constitutional: Acutely ill, pleasant, no distress Vitals:   02/28/20 1649 02/28/20 2044 02/28/20 2130 02/28/20 2230  BP: (!) 151/78 125/62 127/65 123/64  Pulse: 80 70 70 73  Resp: 16 14 10  (!) 21  Temp: 97.8 F (36.6 C)     TempSrc: Oral     SpO2: 98% 100% 96% 96%   Eyes: PERRL, lids and conjunctivae normal ENMT: Mucous membranes are moist. Posterior pharynx clear of any exudate or  lesions.Normal dentition.  Neck: normal, supple, no masses, no thyromegaly Respiratory: clear to auscultation bilaterally, no wheezing, no crackles. Normal respiratory effort. No accessory muscle use.  Cardiovascular: Regular rate and rhythm, no murmurs / rubs / gallops. No extremity edema. 2+ pedal pulses. No carotid bruits.  Abdomen: Distended soft abdomen with positive bowel sounds, no masses palpated. No hepatosplenomegaly. Musculoskeletal: no clubbing / cyanosis. No joint deformity upper and lower extremities. Good ROM, no contractures. Normal muscle tone.  Skin: no rashes, lesions, ulcers. No induration Neurologic: CN 2-12 grossly intact. Sensation intact, DTR normal. Strength 5/5 in all 4.  Psychiatric: Normal judgment and insight. Alert and  oriented x 3. Normal mood.     Labs on Admission: I have personally reviewed following labs and imaging studies  CBC: Recent Labs  Lab 02/28/20 1700  WBC 6.8  HGB 11.9*  HCT 36.5*  MCV 97.3  PLT 709   Basic Metabolic Panel: Recent Labs  Lab 02/28/20 1700  NA 134*  K 4.4  CL 99  CO2 25  GLUCOSE 132*  BUN 12  CREATININE 0.77  CALCIUM 8.8*   GFR: Estimated Creatinine Clearance: 63.1 mL/min (by C-G formula based on SCr of 0.77 mg/dL). Liver Function Tests: Recent Labs  Lab 02/28/20 1700  AST 20  ALT 18  ALKPHOS 46  BILITOT 1.3*  PROT 7.4  ALBUMIN 4.1   Recent Labs  Lab 02/28/20 1700  LIPASE 37   No results for input(s): AMMONIA in the last 168 hours. Coagulation Profile: No results for input(s): INR, PROTIME in the last 168 hours. Cardiac Enzymes: No results for input(s): CKTOTAL, CKMB, CKMBINDEX, TROPONINI in the last 168 hours. BNP (last 3 results) No results for input(s): PROBNP in the last 8760 hours. HbA1C: No results for input(s): HGBA1C in the last 72 hours. CBG: No results for input(s): GLUCAP in the last 168 hours. Lipid Profile: No results for input(s): CHOL, HDL, LDLCALC, TRIG, CHOLHDL, LDLDIRECT in  the last 72 hours. Thyroid Function Tests: No results for input(s): TSH, T4TOTAL, FREET4, T3FREE, THYROIDAB in the last 72 hours. Anemia Panel: No results for input(s): VITAMINB12, FOLATE, FERRITIN, TIBC, IRON, RETICCTPCT in the last 72 hours. Urine analysis:    Component Value Date/Time   COLORURINE YELLOW 02/28/2020 2110   APPEARANCEUR CLEAR 02/28/2020 2110   APPEARANCEUR Hazy 04/18/2012 2324   LABSPEC 1.014 02/28/2020 2110   LABSPEC 1.018 04/18/2012 2324   PHURINE 5.0 02/28/2020 2110   GLUCOSEU NEGATIVE 02/28/2020 2110   GLUCOSEU Negative 04/18/2012 2324   HGBUR NEGATIVE 02/28/2020 2110   BILIRUBINUR NEGATIVE 02/28/2020 2110   BILIRUBINUR Negative 04/18/2012 2324   KETONESUR 20 (A) 02/28/2020 2110   PROTEINUR NEGATIVE 02/28/2020 2110   UROBILINOGEN 0.2 04/09/2014 2138   NITRITE NEGATIVE 02/28/2020 2110   LEUKOCYTESUR NEGATIVE 02/28/2020 2110   LEUKOCYTESUR Negative 04/18/2012 2324   Sepsis Labs: @LABRCNTIP (procalcitonin:4,lacticidven:4) )No results found for this or any previous visit (from the past 240 hour(s)).   Radiological Exams on Admission: No results found.    Assessment/Plan Active Problems:   Abdominal pain   Hyperlipidemia   History of ankylosing spondylitis   Partial small bowel obstruction (HCC)     #1 partial small bowel obstruction: Patient will be admitted for observation. No ongoing nausea or vomiting. Suggestion is to admit. Monitor with no NG tube. Laxatives could be added. Clear liquid diet challenge. If no improvement surgical consultation.  #2 hyperlipidemia: Hold medications until resolution of partial small bowel obstruction.  #3 history of ankylosing spondylitis: Hold medications including methotrexate until patient is able to take p.o.'s properly.   DVT prophylaxis: Lovenox Code Status: Full code Family Communication: No family at bedside Disposition Plan: Home Consults called: None per surgical consult if no resolution Admission  status: Observation  Severity of Illness: The appropriate patient status for this patient is OBSERVATION. Observation status is judged to be reasonable and necessary in order to provide the required intensity of service to ensure the patient's safety. The patient's presenting symptoms, physical exam findings, and initial radiographic and laboratory data in the context of their medical condition is felt to place them at decreased risk for further clinical deterioration. Furthermore, it  is anticipated that the patient will be medically stable for discharge from the hospital within 2 midnights of admission. The following factors support the patient status of observation.   " The patient's presenting symptoms include abdominal pain and distention. " The physical exam findings include distended abdomen. " The initial radiographic and laboratory data are recurrent small bowel obstruction.     Barbette Merino MD [facility] Pager [phone]  If 7PM-7AM, please contact night-coverage [email or web address] Password [email or web address]  02/29/2020, 12:11 AM

## 2020-03-01 DIAGNOSIS — K565 Intestinal adhesions [bands], unspecified as to partial versus complete obstruction: Secondary | ICD-10-CM

## 2020-03-01 DIAGNOSIS — Z8739 Personal history of other diseases of the musculoskeletal system and connective tissue: Secondary | ICD-10-CM

## 2020-03-01 DIAGNOSIS — R1010 Upper abdominal pain, unspecified: Secondary | ICD-10-CM

## 2020-03-01 LAB — CBC WITH DIFFERENTIAL/PLATELET
Abs Immature Granulocytes: 0.03 10*3/uL (ref 0.00–0.07)
Basophils Absolute: 0 10*3/uL (ref 0.0–0.1)
Basophils Relative: 1 %
Eosinophils Absolute: 0.2 10*3/uL (ref 0.0–0.5)
Eosinophils Relative: 3 %
HCT: 30 % — ABNORMAL LOW (ref 39.0–52.0)
Hemoglobin: 9.6 g/dL — ABNORMAL LOW (ref 13.0–17.0)
Immature Granulocytes: 1 %
Lymphocytes Relative: 13 %
Lymphs Abs: 0.7 10*3/uL (ref 0.7–4.0)
MCH: 31.6 pg (ref 26.0–34.0)
MCHC: 32 g/dL (ref 30.0–36.0)
MCV: 98.7 fL (ref 80.0–100.0)
Monocytes Absolute: 0.6 10*3/uL (ref 0.1–1.0)
Monocytes Relative: 10 %
Neutro Abs: 4.2 10*3/uL (ref 1.7–7.7)
Neutrophils Relative %: 72 %
Platelets: 215 10*3/uL (ref 150–400)
RBC: 3.04 MIL/uL — ABNORMAL LOW (ref 4.22–5.81)
RDW: 14.3 % (ref 11.5–15.5)
WBC: 5.7 10*3/uL (ref 4.0–10.5)
nRBC: 0 % (ref 0.0–0.2)

## 2020-03-01 LAB — BASIC METABOLIC PANEL
Anion gap: 4 — ABNORMAL LOW (ref 5–15)
BUN: 5 mg/dL — ABNORMAL LOW (ref 8–23)
CO2: 29 mmol/L (ref 22–32)
Calcium: 8.2 mg/dL — ABNORMAL LOW (ref 8.9–10.3)
Chloride: 103 mmol/L (ref 98–111)
Creatinine, Ser: 0.76 mg/dL (ref 0.61–1.24)
GFR, Estimated: >60 mL/min (ref >60)
Glucose, Bld: 152 mg/dL — ABNORMAL HIGH (ref 70–99)
Potassium: 3.1 mmol/L — ABNORMAL LOW (ref 3.5–5.1)
Sodium: 136 mmol/L (ref 135–145)

## 2020-03-01 LAB — C DIFFICILE QUICK SCREEN W PCR REFLEX
C Diff antigen: NEGATIVE
C Diff interpretation: NOT DETECTED
C Diff toxin: NEGATIVE

## 2020-03-01 LAB — MAGNESIUM: Magnesium: 1.7 mg/dL (ref 1.7–2.4)

## 2020-03-01 LAB — PHOSPHORUS: Phosphorus: 2.2 mg/dL — ABNORMAL LOW (ref 2.5–4.6)

## 2020-03-01 MED ORDER — POTASSIUM PHOSPHATES 15 MMOLE/5ML IV SOLN
30.0000 mmol | Freq: Once | INTRAVENOUS | Status: AC
  Administered 2020-03-01: 30 mmol via INTRAVENOUS
  Filled 2020-03-01: qty 10

## 2020-03-01 MED ORDER — LOPERAMIDE HCL 2 MG PO CAPS: 2.0000 mg | ORAL_CAPSULE | ORAL | Status: DC | PRN

## 2020-03-01 MED ORDER — DEXTROSE IN LACTATED RINGERS 5 % IV SOLN: INTRAVENOUS | Status: AC

## 2020-03-01 NOTE — Progress Notes (Signed)
[facility]                                                                              Patient Demographics  Steven [name], is a 85 y.o. male, DOB - 1935/01/19, NOM:[id number]  Admit date - 02/28/2020   Admitting Physician Barb Merino, MD  Outpatient Primary MD for the patient is [name], MD  Outpatient specialists:   LOS - 1  days   Medical records reviewed and are as summarized below:    Chief Complaint  Patient presents with   Abdominal Pain       Brief summary   85 year old male with history of recurrent SBO, last hospitalization in July 2021 at [facility], depression, hyperlipidemia, osteoarthritis, presented to ED with abdominal distention, nausea vomiting and diarrhea.  Reported his symptoms were similar to previous SBO.  He was able to pass gas and had a bowel movement but noticed the worsening distention, one episode of vomiting.  In the ED, patient had no ongoing vomiting hence NG tube was not inserted.  CT abdomen pelvis showed small and large bowel anastomosis in the left lower quadrant with some evidence of partial SBO.   Assessment & Plan    Principal Problem: Abdominal pain secondary to partial small bowel obstruction due to adhesions (HCC) -Improving, patient had multiple BMs this morning, ambulating without any difficulty, -Advance to full liquid diet and further as tolerated to soft solids -Decrease IV fluids   Active Problems: Diarrhea -Per patient he had 3-4 BMs this morning, C. difficile was negative -Imodium as needed, would like to avoid if possible unless diarrhea persistent as patient presented with SBO   Hyperlipidemia Will resume statins at the time of discharge  History of ankylosing spondylitis Hold meds until patient is taking p.o. without any difficulty, takes methotrexate weekly, can resume at the time of discharge.    Code Status: Full code DVT Prophylaxis:  Lovenox Family Communication:  Discussed all imaging results, lab results, explained to the patient   Disposition Plan:     Status is: Inpatient  Remains inpatient appropriate because:Inpatient level of care appropriate due to severity of illness   Dispo: The patient is from: Home              Anticipated d/c is to: Home              Anticipated d/c date is: 1 day              Patient currently is not medically stable to d/c.  Having diarrhea, on clears, advancing diet.  Hopefully DC home in a.m.  Patient ambulating without any difficulty.      Time Spent in minutes   35 minutes  Procedures:  None  Consultants:   None  Antimicrobials:   Anti-infectives (From admission, onward)   None         Medications  Scheduled Meds:  citalopram  20 mg Oral Daily   enoxaparin (LOVENOX) injection  40 mg Subcutaneous Q24H   Continuous Infusions:  dextrose 5% lactated ringers     PRN Meds:.HYDROmorphone (DILAUDID) injection, ondansetron **OR** ondansetron (ZOFRAN) IV  Subjective:   Steven [name] was seen and examined today.  States 3-4 BMs this morning, abdominal pain improving, no nausea or vomiting.  No fevers or chills  Objective:   Vitals:   02/29/20 0947 02/29/20 1318 02/29/20 2104 03/01/20 0531  BP: 110/63 (!) 106/56 110/74 118/61  Pulse: 68 66 67 (!) 57  Resp: 18 18 16 16   Temp: 98.3 F (36.8 C) 98.1 F (36.7 C) 98.5 F (36.9 C) (!) 97.5 F (36.4 C)  TempSrc:   Oral Oral  SpO2: 95% 96% 98% 96%  Weight:      Height:        Intake/Output Summary (Last 24 hours) at 03/01/2020 1410 Last data filed at 03/01/2020 0600 Gross per 24 hour  Intake 2524.9 ml  Output 350 ml  Net 2174.9 ml     Wt Readings from Last 3 Encounters:  02/29/20 63.8 kg  02/16/20 67.2 kg  11/16/19 65 kg     Exam  General: Alert and oriented x 3, NAD  Cardiovascular: S1 S2 auscultated, no murmurs, RRR  Respiratory: Clear to auscultation bilaterally, no wheezing, rales or  rhonchi  Gastrointestinal: Soft, nontender, nondistended, + bowel sounds, multiple surgical scars  Ext: no pedal edema bilaterally  Neuro: no new deficits  Musculoskeletal: No digital cyanosis, clubbing  Skin: No rashes  Psych: Normal affect and demeanor, alert and oriented x3    Data Reviewed:  I have personally reviewed following labs and imaging studies  Micro Results Recent Results (from the past 240 hour(s))  Respiratory Panel by RT PCR (Flu A&B, Covid) - Nasopharyngeal Swab     Status: None   Collection Time: 02/28/20 11:37 PM   Specimen: Nasopharyngeal Swab  Result Value Ref Range Status   SARS Coronavirus 2 by RT PCR NEGATIVE NEGATIVE Final    Comment: (NOTE) SARS-CoV-2 target nucleic acids are NOT DETECTED.  The SARS-CoV-2 RNA is generally detectable in upper respiratoy specimens during the acute phase of infection. The lowest concentration of SARS-CoV-2 viral copies this assay can detect is 131 copies/mL. A negative result does not preclude SARS-Cov-2 infection and should not be used as the sole basis for treatment or other patient management decisions. A negative result may occur with  improper specimen collection/handling, submission of specimen other than nasopharyngeal swab, presence of viral mutation(s) within the areas targeted by this assay, and inadequate number of viral copies (<131 copies/mL). A negative result must be combined with clinical observations, patient history, and epidemiological information. The expected result is Negative.  Fact Sheet for Patients:  PinkCheek.be  Fact Sheet for Healthcare Providers:  GravelBags.it  This test is no t yet approved or cleared by the [place] FDA and  has been authorized for detection and/or diagnosis of SARS-CoV-2 by FDA under an Emergency Use Authorization (EUA). This EUA will remain  in effect (meaning this test can be used) for the duration  of the COVID-19 declaration under Section 564(b)(1) of the Act, 21 U.S.C. section 360bbb-3(b)(1), unless the authorization is terminated or revoked sooner.     Influenza A by PCR NEGATIVE NEGATIVE Final   Influenza B by PCR NEGATIVE NEGATIVE Final    Comment: (NOTE) The Xpert Xpress SARS-CoV-2/FLU/RSV assay is intended as an aid in  the diagnosis of influenza from Nasopharyngeal swab specimens and  should not be used as a sole basis for treatment. Nasal washings and  aspirates are unacceptable for Xpert Xpress SARS-CoV-2/FLU/RSV  testing.  Fact Sheet for Patients: PinkCheek.be  Fact Sheet for Healthcare Providers:  GravelBags.it  This test is not yet approved or cleared by the [place] and  has been authorized for detection and/or diagnosis of SARS-CoV-2 by  FDA under an Emergency Use Authorization (EUA). This EUA will remain  in effect (meaning this test can be used) for the duration of the  Covid-19 declaration under Section 564(b)(1) of the Act, 21  U.S.C. section 360bbb-3(b)(1), unless the authorization is  terminated or revoked. Performed at [facility], [place] [address], [place], [place] [address]   C Difficile Quick Screen w PCR reflex     Status: None   Collection Time: 03/01/20 11:24 AM   Specimen: STOOL  Result Value Ref Range Status   C Diff antigen NEGATIVE NEGATIVE Final   C Diff toxin NEGATIVE NEGATIVE Final   C Diff interpretation No C. difficile detected.  Final    Comment: Performed at [facility], [place] [address], [place], [place] [address]    Radiology Reports CT ABDOMEN PELVIS W CONTRAST  Result Date: 02/29/2020 CLINICAL DATA:  85 year old male with diffuse abdominal pain. EXAM: CT ABDOMEN AND PELVIS WITH CONTRAST TECHNIQUE: Multidetector CT imaging of the abdomen and pelvis was performed using the standard protocol following bolus administration of  intravenous contrast. CONTRAST:  160mL OMNIPAQUE IOHEXOL 300 MG/ML  SOLN COMPARISON:  CT Abdomen and Pelvis 11/02/2019. FINDINGS: Lower chest: Stable, negative. Hepatobiliary: Absent gallbladder as before.  Negative liver. Pancreas: Negative. Spleen: Negative. Adrenals/Urinary Tract: Normal adrenal glands. Stable right greater than left nephrolithiasis. No obstructive uropathy. Symmetric renal enhancement and contrast excretion. Urinary bladder partially obscured by streak artifact from the right hip arthroplasty but appears grossly normal. Stable pelvic phleboliths. Stomach/Bowel: Redundant distal large bowel with retained stool increased since July. In the left lower quadrant there is a large bowel anastomosis (series 2, images 43 and 44) with no adverse features. Upstream of the anastomosis the colon is mostly decompressed, also contains some fluid. The terminal ileum and small bowel loops in the right lower quadrant are decompressed. In the left abdomen small bowel loops containing air in fluid are mildly dilated up to 36 mm. There is a small bowel anastomosis in the left anterior abdomen on series 2, image 42. And a gradual transition to nondilated small bowel appears to occur in the left lower quadrant. No free air. No free fluid. Stomach and duodenum appear within normal limits. Vascular/Lymphatic: Aortoiliac calcified atherosclerosis. Stable mildly tortuous abdominal aorta. Major arterial structures appear patent. Portal venous system is patent. No lymphadenopathy. Reproductive: Negative. Other: No pelvic free fluid. Musculoskeletal: Diffuse spinal ankylosis. Ankylosed SI joints, favor ankylosing spondylitis. Chronic right hip arthroplasty. No acute osseous abnormality identified. IMPRESSION: 1. Small and large bowel anastomoses in the left lower quadrant where a relative transition from mildly dilated to decompressed small bowel occurs suspicious for a partial small bowel obstruction. No free air, free  fluid, or other complicating features. 2. Otherwise stable CT Abdomen and Pelvis including Ankylosing Spondylitis, nephrolithiasis, Calcified aortic atherosclerosis. A written preliminary report of this exam during a planned IT downtime was issued by Dr. Jesse Fall at 2306 hours. Electronically Signed   By: Genevie Ann M.D.   On: 02/29/2020 01:42    Lab Data:  CBC: Recent Labs  Lab 02/28/20 1700 02/29/20 0321 03/01/20 0342  WBC 6.8 5.4 5.7  NEUTROABS  --   --  4.2  HGB 11.9* 10.7* 9.6*  HCT 36.5* 33.3* 30.0*  MCV 97.3 98.8 98.7  PLT 264 234 215  Basic Metabolic Panel: Recent Labs  Lab 02/28/20 1700 02/29/20 0321 03/01/20 0342  NA 134* 134* 136  K 4.4 3.5 3.1*  CL 99 99 103  CO2 25 24 29   GLUCOSE 132* 121* 152*  BUN 12 11 5*  CREATININE 0.77 0.69   0.73 0.76  CALCIUM 8.8* 8.1* 8.2*  MG  --   --  1.7  PHOS  --   --  2.2*   GFR: Estimated Creatinine Clearance: 63.1 mL/min (by C-G formula based on SCr of 0.76 mg/dL). Liver Function Tests: Recent Labs  Lab 02/28/20 1700 02/29/20 0321  AST 20 16  ALT 18 16  ALKPHOS 46 37*  BILITOT 1.3* 1.2  PROT 7.4 6.3*  ALBUMIN 4.1 3.5   Recent Labs  Lab 02/28/20 1700  LIPASE 37   No results for input(s): AMMONIA in the last 168 hours. Coagulation Profile: No results for input(s): INR, PROTIME in the last 168 hours. Cardiac Enzymes: No results for input(s): CKTOTAL, CKMB, CKMBINDEX, TROPONINI in the last 168 hours. BNP (last 3 results) No results for input(s): PROBNP in the last 8760 hours. HbA1C: No results for input(s): HGBA1C in the last 72 hours. CBG: No results for input(s): GLUCAP in the last 168 hours. Lipid Profile: No results for input(s): CHOL, HDL, LDLCALC, TRIG, CHOLHDL, LDLDIRECT in the last 72 hours. Thyroid Function Tests: No results for input(s): TSH, T4TOTAL, FREET4, T3FREE, THYROIDAB in the last 72 hours. Anemia Panel: No results for input(s): VITAMINB12, FOLATE, FERRITIN, TIBC, IRON, RETICCTPCT in the  last 72 hours. Urine analysis:    Component Value Date/Time   COLORURINE YELLOW 02/28/2020 2110   APPEARANCEUR CLEAR 02/28/2020 2110   APPEARANCEUR Hazy 04/18/2012 2324   LABSPEC 1.014 02/28/2020 2110   LABSPEC 1.018 04/18/2012 2324   PHURINE 5.0 02/28/2020 2110   GLUCOSEU NEGATIVE 02/28/2020 2110   GLUCOSEU Negative 04/18/2012 2324   HGBUR NEGATIVE 02/28/2020 2110   BILIRUBINUR NEGATIVE 02/28/2020 2110   BILIRUBINUR Negative 04/18/2012 2324   KETONESUR 20 (A) 02/28/2020 2110   PROTEINUR NEGATIVE 02/28/2020 2110   UROBILINOGEN 0.2 04/09/2014 2138   NITRITE NEGATIVE 02/28/2020 2110   LEUKOCYTESUR NEGATIVE 02/28/2020 2110   LEUKOCYTESUR Negative 04/18/2012 2324     Frederick Marro M.D. [facility] 03/01/2020, 2:10 PM   Call night coverage person covering after 7pm

## 2020-03-02 DIAGNOSIS — E7800 Pure hypercholesterolemia, unspecified: Secondary | ICD-10-CM

## 2020-03-02 MED ORDER — ACETAMINOPHEN 325 MG PO TABS
650.0000 mg | ORAL_TABLET | Freq: Four times a day (QID) | ORAL | Status: DC | PRN
  Administered 2020-03-02: 650 mg via ORAL
  Filled 2020-03-02: qty 2

## 2020-03-02 MED ORDER — ONDANSETRON 4 MG PO TBDP: 4.0000 mg | ORAL_TABLET | Freq: Three times a day (TID) | ORAL | 0 refills | Status: DC | PRN

## 2020-03-02 MED ORDER — POLYETHYLENE GLYCOL 3350 17 G PO PACK: 17.0000 g | PACK | Freq: Every day | ORAL | 0 refills | Status: DC | PRN

## 2020-03-02 NOTE — Progress Notes (Signed)
TOC Chart reviewed and no CM needs identified. [place] [place] ED TOC CM [phone]

## 2020-03-02 NOTE — Discharge Summary (Signed)
Physician Discharge Summary   Patient ID: Steven [name] MRN: [id number] DOB/AGE: 1934/10/09 85 y.o.  Admit date: 02/28/2020 Discharge date: 03/02/2020  Primary Care Physician:  [name], MD   Recommendations for Outpatient Follow-up:  1. Follow up with PCP in 1-2 weeks  Home Health: At baseline Equipment/Devices:   Discharge Condition: stable  CODE STATUS: FULL  Diet recommendation: Soft diet   Discharge Diagnoses:    . Abdominal pain . Hyperlipidemia . Partial small bowel obstruction ([place]) . Diarrhea   Consults:  None     Allergies:   Allergies  Allergen Reactions  . Other Hives  . Indomethacin Hives  . Lactose Intolerance (Gi)     GI UPSET     DISCHARGE MEDICATIONS: Allergies as of 03/02/2020      Reactions   Other Hives   Indomethacin Hives   Lactose Intolerance (gi)    GI UPSET      Medication List    TAKE these medications   acetaminophen 650 MG CR tablet Commonly known as: TYLENOL Take 650 mg by mouth every 8 (eight) hours.   B-complex with vitamin C tablet Take 1 tablet by mouth daily.   Biotin 1 MG Caps Take 1 capsule by mouth daily.   Calcium Carbonate-Vitamin D 600-400 MG-UNIT tablet Take 1 tablet by mouth 2 (two) times daily.   cholecalciferol 25 MCG (1000 UNIT) tablet Commonly known as: VITAMIN D Take 1,000 Units by mouth daily.   citalopram 20 MG tablet Commonly known as: CELEXA Take 20 mg by mouth daily.   folic acid 1 MG tablet Commonly known as: FOLVITE Take 1 mg by mouth in the morning, at noon, and at bedtime.   ipratropium 0.06 % nasal spray Commonly known as: ATROVENT Place 2 sprays into both nostrils 2 (two) times daily.   methotrexate 2.5 MG tablet Commonly known as: RHEUMATREX TAKE 8 TABLETS BY MOUTH ONCE A WEEK.   ondansetron 4 MG disintegrating tablet Commonly known as: Zofran ODT Take 1 tablet (4 mg total) by mouth every 8 (eight) hours as needed for nausea or vomiting.   polyethylene glycol  17 g packet Commonly known as: MIRALAX / GLYCOLAX Take 17 g by mouth daily as needed for mild constipation or moderate constipation (alaso available OTC).   PRESERVISION AREDS 2 PO Take 1 tablet by mouth in the morning and at bedtime.   rosuvastatin 10 MG tablet Commonly known as: CRESTOR Take 10 mg by mouth at bedtime.   Soothe XP Soln Place 1 drop into both eyes 2 (two) times daily.        Brief H and P: For complete details please refer to admission H and P, but in brief 85 year old male with history of recurrent SBO, last hospitalization in July 2021 at [facility], depression, hyperlipidemia, osteoarthritis, presented to ED with abdominal distention, nausea vomiting and diarrhea.  Reported his symptoms were similar to previous SBO.  He was able to pass gas and had a bowel movement but noticed the worsening distention, one episode of vomiting.  In the ED, patient had no ongoing vomiting hence NG tube was not inserted.  CT abdomen pelvis showed small and large bowel anastomosis in the left lower quadrant with some evidence of partial SBO.  Hospital Course:   Abdominal pain secondary to partial small bowel obstruction due to adhesions (HCC) resolved -Partial small bowel obstruction likely due to lesions, patient initially placed on n.p.o. status IV fluids.   -He improved with the conservative management, did not  need NG tube as no active vomiting.  He had 3-4 bowel movements on 11/19. -Placed on diet and tolerating soft solids without any difficulty.  He is ambulating without any assistance.  Patient recommended to avoid constipation and continue MiraLAX daily as needed   Diarrhea -Per patient he had 3-4 BMs on 11/19, C. difficile was negative   Hyperlipidemia Continue statin  History of ankylosing spondylitis Hold meds until patient is taking p.o. without any difficulty, takes methotrexate weekly, can resume at the time of discharge.   Day of Discharge S:  Tolerating diet without any difficulty, no nausea or vomiting, looking forward to go home.  BP 125/68 (BP Location: Right Arm)   Pulse (!) 56   Temp 97.7 F (36.5 C) (Oral)   Resp 15   Ht 5\' 7"  (1.702 m)   Wt 63.8 kg   SpO2 98%   BMI 22.03 kg/m   Physical Exam: General: Alert and awake oriented x3 not in any acute distress. HEENT: anicteric sclera, pupils reactive to light and accommodation CVS: S1-S2 clear no murmur rubs or gallops Chest: clear to auscultation bilaterally, no wheezing rales or rhonchi Abdomen: soft nontender, nondistended, normal bowel sounds, surgical scars Extremities: no cyanosis, clubbing or edema noted bilaterally Neuro: Cranial nerves II-XII intact, no focal neurological deficits    Get Medicines reviewed and adjusted: Please take all your medications with you for your next visit with your Primary MD  Please request your Primary MD to go over all hospital tests and procedure/radiological results at the follow up. Please ask your Primary MD to get all Hospital records sent to his/her office.  If you experience worsening of your admission symptoms, develop shortness of breath, life threatening emergency, suicidal or homicidal thoughts you must seek medical attention immediately by calling 911 or calling your MD immediately  if symptoms less severe.  You must read complete instructions/literature along with all the possible adverse reactions/side effects for all the Medicines you take and that have been prescribed to you. Take any new Medicines after you have completely understood and accept all the possible adverse reactions/side effects.   Do not drive when taking pain medications.   Do not take more than prescribed Pain, Sleep and Anxiety Medications  Special Instructions: If you have smoked or chewed Tobacco  in the last 2 yrs please stop smoking, stop any regular Alcohol  and or any Recreational drug use.  Wear Seat belts while driving.  Please  note  You were cared for by a hospitalist during your hospital stay. Once you are discharged, your primary care physician will handle any further medical issues. Please note that NO REFILLS for any discharge medications will be authorized once you are discharged, as it is imperative that you return to your primary care physician (or establish a relationship with a primary care physician if you do not have one) for your aftercare needs so that they can reassess your need for medications and monitor your lab values.   The results of significant diagnostics from this hospitalization (including imaging, microbiology, ancillary and laboratory) are listed below for reference.      Procedures/Studies:  CT ABDOMEN PELVIS W CONTRAST  Result Date: 02/29/2020 CLINICAL DATA:  85 year old male with diffuse abdominal pain. EXAM: CT ABDOMEN AND PELVIS WITH CONTRAST TECHNIQUE: Multidetector CT imaging of the abdomen and pelvis was performed using the standard protocol following bolus administration of intravenous contrast. CONTRAST:  170mL OMNIPAQUE IOHEXOL 300 MG/ML  SOLN COMPARISON:  CT Abdomen and Pelvis  11/02/2019. FINDINGS: Lower chest: Stable, negative. Hepatobiliary: Absent gallbladder as before.  Negative liver. Pancreas: Negative. Spleen: Negative. Adrenals/Urinary Tract: Normal adrenal glands. Stable right greater than left nephrolithiasis. No obstructive uropathy. Symmetric renal enhancement and contrast excretion. Urinary bladder partially obscured by streak artifact from the right hip arthroplasty but appears grossly normal. Stable pelvic phleboliths. Stomach/Bowel: Redundant distal large bowel with retained stool increased since July. In the left lower quadrant there is a large bowel anastomosis (series 2, images 43 and 44) with no adverse features. Upstream of the anastomosis the colon is mostly decompressed, also contains some fluid. The terminal ileum and small bowel loops in the right lower quadrant  are decompressed. In the left abdomen small bowel loops containing air in fluid are mildly dilated up to 36 mm. There is a small bowel anastomosis in the left anterior abdomen on series 2, image 42. And a gradual transition to nondilated small bowel appears to occur in the left lower quadrant. No free air. No free fluid. Stomach and duodenum appear within normal limits. Vascular/Lymphatic: Aortoiliac calcified atherosclerosis. Stable mildly tortuous abdominal aorta. Major arterial structures appear patent. Portal venous system is patent. No lymphadenopathy. Reproductive: Negative. Other: No pelvic free fluid. Musculoskeletal: Diffuse spinal ankylosis. Ankylosed SI joints, favor ankylosing spondylitis. Chronic right hip arthroplasty. No acute osseous abnormality identified. IMPRESSION: 1. Small and large bowel anastomoses in the left lower quadrant where a relative transition from mildly dilated to decompressed small bowel occurs suspicious for a partial small bowel obstruction. No free air, free fluid, or other complicating features. 2. Otherwise stable CT Abdomen and Pelvis including Ankylosing Spondylitis, nephrolithiasis, Calcified aortic atherosclerosis. A written preliminary report of this exam during a planned IT downtime was issued by Dr. Jesse Fall at 2306 hours. Electronically Signed   By: Genevie Ann M.D.   On: 02/29/2020 01:42       LAB RESULTS: Basic Metabolic Panel: Recent Labs  Lab 02/29/20 0321 03/01/20 0342  NA 134* 136  K 3.5 3.1*  CL 99 103  CO2 24 29  GLUCOSE 121* 152*  BUN 11 5*  CREATININE 0.69  0.73 0.76  CALCIUM 8.1* 8.2*  MG  --  1.7  PHOS  --  2.2*   Liver Function Tests: Recent Labs  Lab 02/28/20 1700 02/29/20 0321  AST 20 16  ALT 18 16  ALKPHOS 46 37*  BILITOT 1.3* 1.2  PROT 7.4 6.3*  ALBUMIN 4.1 3.5   Recent Labs  Lab 02/28/20 1700  LIPASE 37   No results for input(s): AMMONIA in the last 168 hours. CBC: Recent Labs  Lab 02/29/20 0321 02/29/20 0321  03/01/20 0342  WBC 5.4  --  5.7  NEUTROABS  --   --  4.2  HGB 10.7*  --  9.6*  HCT 33.3*  --  30.0*  MCV 98.8   < > 98.7  PLT 234  --  215   < > = values in this interval not displayed.   Cardiac Enzymes: No results for input(s): CKTOTAL, CKMB, CKMBINDEX, TROPONINI in the last 168 hours. BNP: Invalid input(s): POCBNP CBG: No results for input(s): GLUCAP in the last 168 hours.     Disposition and Follow-up: Discharge Instructions    Diet - low sodium heart healthy   Complete by: As directed    Increase activity slowly   Complete by: As directed        DISPOSITION: [place]    [name], MD. Schedule  an appointment as soon as possible for a visit in 2 week(s).   Specialty: Internal Medicine Contact information: [address] [place] [address] [phone]                Time coordinating discharge:  35 mins   Signed:   Estill Cotta M.D. [facility] 03/02/2020, 12:56 PM

## 2020-03-05 DIAGNOSIS — Z09 Encounter for follow-up examination after completed treatment for conditions other than malignant neoplasm: Secondary | ICD-10-CM | POA: Diagnosis not present

## 2020-03-05 DIAGNOSIS — E559 Vitamin D deficiency, unspecified: Secondary | ICD-10-CM | POA: Diagnosis not present

## 2020-03-05 DIAGNOSIS — I6521 Occlusion and stenosis of right carotid artery: Secondary | ICD-10-CM | POA: Diagnosis not present

## 2020-03-05 DIAGNOSIS — E1122 Type 2 diabetes mellitus with diabetic chronic kidney disease: Secondary | ICD-10-CM | POA: Diagnosis not present

## 2020-03-05 DIAGNOSIS — Z8673 Personal history of transient ischemic attack (TIA), and cerebral infarction without residual deficits: Secondary | ICD-10-CM | POA: Diagnosis not present

## 2020-03-05 DIAGNOSIS — E785 Hyperlipidemia, unspecified: Secondary | ICD-10-CM | POA: Diagnosis not present

## 2020-04-10 DIAGNOSIS — J3089 Other allergic rhinitis: Secondary | ICD-10-CM | POA: Diagnosis not present

## 2020-04-10 DIAGNOSIS — J3081 Allergic rhinitis due to animal (cat) (dog) hair and dander: Secondary | ICD-10-CM | POA: Diagnosis not present

## 2020-04-10 DIAGNOSIS — T63441A Toxic effect of venom of bees, accidental (unintentional), initial encounter: Secondary | ICD-10-CM | POA: Diagnosis not present

## 2020-04-10 DIAGNOSIS — J301 Allergic rhinitis due to pollen: Secondary | ICD-10-CM | POA: Diagnosis not present

## 2020-05-17 ENCOUNTER — Other Ambulatory Visit: Payer: Self-pay | Admitting: Hematology and Oncology

## 2020-05-17 ENCOUNTER — Inpatient Hospital Stay: Payer: Medicare Other

## 2020-05-17 ENCOUNTER — Inpatient Hospital Stay: Payer: Medicare Other | Attending: Hematology and Oncology | Admitting: Hematology and Oncology

## 2020-05-17 ENCOUNTER — Encounter: Payer: Self-pay | Admitting: Hematology and Oncology

## 2020-05-17 ENCOUNTER — Other Ambulatory Visit: Payer: Self-pay

## 2020-05-17 VITALS — BP 103/77 | HR 73 | Temp 97.3°F | Resp 18 | Ht 67.0 in | Wt 145.8 lb

## 2020-05-17 DIAGNOSIS — Z87442 Personal history of urinary calculi: Secondary | ICD-10-CM | POA: Diagnosis not present

## 2020-05-17 DIAGNOSIS — R0602 Shortness of breath: Secondary | ICD-10-CM | POA: Insufficient documentation

## 2020-05-17 DIAGNOSIS — D500 Iron deficiency anemia secondary to blood loss (chronic): Secondary | ICD-10-CM

## 2020-05-17 DIAGNOSIS — E785 Hyperlipidemia, unspecified: Secondary | ICD-10-CM | POA: Insufficient documentation

## 2020-05-17 DIAGNOSIS — Z79899 Other long term (current) drug therapy: Secondary | ICD-10-CM | POA: Insufficient documentation

## 2020-05-17 DIAGNOSIS — Z803 Family history of malignant neoplasm of breast: Secondary | ICD-10-CM | POA: Diagnosis not present

## 2020-05-17 DIAGNOSIS — D649 Anemia, unspecified: Secondary | ICD-10-CM

## 2020-05-17 DIAGNOSIS — Z87891 Personal history of nicotine dependence: Secondary | ICD-10-CM | POA: Insufficient documentation

## 2020-05-17 DIAGNOSIS — K315 Obstruction of duodenum: Secondary | ICD-10-CM | POA: Diagnosis not present

## 2020-05-17 DIAGNOSIS — D509 Iron deficiency anemia, unspecified: Secondary | ICD-10-CM | POA: Diagnosis not present

## 2020-05-17 LAB — CBC WITH DIFFERENTIAL (CANCER CENTER ONLY)
Abs Immature Granulocytes: 0.04 10*3/uL (ref 0.00–0.07)
Basophils Absolute: 0 10*3/uL (ref 0.0–0.1)
Basophils Relative: 1 %
Eosinophils Absolute: 0.1 10*3/uL (ref 0.0–0.5)
Eosinophils Relative: 2 %
HCT: 35.4 % — ABNORMAL LOW (ref 39.0–52.0)
Hemoglobin: 11.7 g/dL — ABNORMAL LOW (ref 13.0–17.0)
Immature Granulocytes: 1 %
Lymphocytes Relative: 18 %
Lymphs Abs: 1.2 10*3/uL (ref 0.7–4.0)
MCH: 31.2 pg (ref 26.0–34.0)
MCHC: 33.1 g/dL (ref 30.0–36.0)
MCV: 94.4 fL (ref 80.0–100.0)
Monocytes Absolute: 0.4 10*3/uL (ref 0.1–1.0)
Monocytes Relative: 6 %
Neutro Abs: 4.9 10*3/uL (ref 1.7–7.7)
Neutrophils Relative %: 72 %
Platelet Count: 247 10*3/uL (ref 150–400)
RBC: 3.75 MIL/uL — ABNORMAL LOW (ref 4.22–5.81)
RDW: 14.8 % (ref 11.5–15.5)
WBC Count: 6.7 10*3/uL (ref 4.0–10.5)
nRBC: 0 % (ref 0.0–0.2)

## 2020-05-17 LAB — RETIC PANEL
Immature Retic Fract: 17.7 % — ABNORMAL HIGH (ref 2.3–15.9)
RBC.: 3.7 MIL/uL — ABNORMAL LOW (ref 4.22–5.81)
Retic Count, Absolute: 34 10*3/uL (ref 19.0–186.0)
Retic Ct Pct: 0.9 % (ref 0.4–3.1)
Reticulocyte Hemoglobin: 41.3 pg (ref >27.9)

## 2020-05-17 LAB — CMP (CANCER CENTER ONLY)
ALT: 15 U/L (ref 0–44)
AST: 18 U/L (ref 15–41)
Albumin: 4 g/dL (ref 3.5–5.0)
Alkaline Phosphatase: 51 U/L (ref 38–126)
Anion gap: 8 (ref 5–15)
BUN: 18 mg/dL (ref 8–23)
CO2: 28 mmol/L (ref 22–32)
Calcium: 9 mg/dL (ref 8.9–10.3)
Chloride: 103 mmol/L (ref 98–111)
Creatinine: 0.9 mg/dL (ref 0.61–1.24)
GFR, Estimated: >60 mL/min (ref >60)
Glucose, Bld: 180 mg/dL — ABNORMAL HIGH (ref 70–99)
Potassium: 4 mmol/L (ref 3.5–5.1)
Sodium: 139 mmol/L (ref 135–145)
Total Bilirubin: 0.5 mg/dL (ref 0.3–1.2)
Total Protein: 7.4 g/dL (ref 6.5–8.1)

## 2020-05-17 NOTE — Progress Notes (Signed)
[place] [phone]   [phone]  PROGRESS NOTE  Patient Care Team: [name], MD as PCP - General (Internal Medicine)  Hematological/Oncological History #Normocytic Anemia #Iron Deficiency Anemia 1) 01/01/2017: WBC 5.1, Hgb 11.0, Plt 254, MCV 91.6 2) 12/07/2017: WBC 8.0, Hgb 11.9, Plt 352, MCV 91.4. Iron 76, TIBC 359, Sat 21%, Ferritin 31 3) 03/09/2019: WBC 14.3, Hgb 11.1, Plt 374, MCV 88.8 4) 05/22/2019: WBC 5.5, Hgb 10.7, Plt 311, MCV 83.7. Routine PCP visit. 5) 06/14/2019: establish care with Dr. Lorenso Courier. WBC 5.0, Hgb 10.3, MCV 84.9, Plt 320 6) 09/14/2019: WBC 6.2, Hgb 10.9, MCV 91.2, Plt 209 7)  11/01/2019: WBC 9.2, Hgb 12.6, MVC 97.2, Plt 299 8) 11/05/2019: patient admitted with SBO, instructed to stop PO iron indefinitely.  9) 02/28/2020: admitted with SBO, Hgb drop to 9.6 while inpatient 10) 05/17/2020: Hgb 11.7, increased without intervention.   Interval History:  Steven [name] 86 y.o. male with medical history significant for iron deficiency anemia who presents for a follow up visit. The patient's last visit was on 02/16/2020 at which time his Hgb was declining modestly. In the interim since the last visit Mr. Homeyer has had a hospital admission for an SBO.   On exam today Mr. Misner notes that other than his hospitalization he has been quite well.  He notes that he is not sure what triggered his last SBO, though his hemoglobin did drop down to 9.6 during that time.  He reports that he unfortunately "has been admitted during the last 2 Thanksgiving's".  He notes that he controls his diet and does his best to make sure he has a bowel movement every day to prevent further obstruction.  He reports today that his energy levels are good and that he feels "great".  He notes that this is taxis and he has been doing his best to keep up.  He is not having any issues with shortness of breath though he does note he does not physically stress himself too hard.    Right now he denies having any issues with fevers, chills, sweats, nausea, vomiting or diarrhea.  He denies seeing any overt signs of blood in his stool.  A full 10 point ROS is listed below.  MEDICAL HISTORY:  Past Medical History:  Diagnosis Date  . Allergy    enviromental  . Anemia 2017  . Anxiety   . Arthritis    ankylosing spodilitis  . Blood transfusion without reported diagnosis    during surgery  . Cataract   . Depression   . Dyspnea    with exertion - had Echo done 09/29/16  . History of kidney stones   . Hyperlipidemia   . Intestinal obstruction ([place])   . Pneumonia    as a child    SURGICAL HISTORY: Past Surgical History:  Procedure Laterality Date  . ABDOMINAL SURGERY     had abcess  . APPENDECTOMY    . CHOLECYSTECTOMY     with lysis of adhesions  . COLONOSCOPY    . COLOSTOMY    . COLOSTOMY REVERSAL    . EYE SURGERY Left    scar tissue removed from cornea  . EYE SURGERY Right    cataract surgery with lens implant  . JOINT REPLACEMENT Right    hip  x 2 1999 and 2007  . SHOULDER ARTHROSCOPY WITH ROTATOR CUFF REPAIR AND SUBACROMIAL DECOMPRESSION Left 03/02/2013   Procedure: LEFT SHOULDER ARTHROSCOPY WITH SUBACROMIAL DECOMPRESSION DISTAL CALVICLE RESECTION AND ROTATOR  CUFF REPAIR ;  Surgeon: [name], MD;  Location: [facility];  Service: Orthopedics;  Laterality: Left;  . TOTAL HIP REVISION Right 11/06/2016   Procedure: TOTAL HIP REVISION OF THE ACETABULAR COMPONENT;  Surgeon: [name], MD;  Location: [facility];  Service: Orthopedics;  Laterality: Right;    SOCIAL HISTORY: Social History   Socioeconomic History  . Marital status: Married    Spouse name: Not on file  . Number of children: 2  . Years of education: Not on file  . Highest education level: Not on file  Occupational History  . Occupation: retired  Tobacco Use  . Smoking status: Former Research scientist (life sciences)  . Smokeless tobacco: Never Used  Vaping Use  . Vaping Use: Never used  Substance and Sexual  Activity  . Alcohol use: Yes    Comment: 3 beers or glasses of wine a day--reports stopped ETOH 11/01/16   . Drug use: No  . Sexual activity: Never  Other Topics Concern  . Not on file  Social History Narrative  . Not on file   Social Determinants of Health   Financial Resource Strain: Not on file  Food Insecurity: Not on file  Transportation Needs: Not on file  Physical Activity: Not on file  Stress: Not on file  Social Connections: Not on file  Intimate Partner Violence: Not on file    FAMILY HISTORY: Family History  Problem Relation Age of Onset  . Heart attack Mother   . Diabetes Mother   . Breast cancer Mother   . Heart attack Maternal Grandfather   . Pancreatic cancer Brother   . Colon cancer Neg Hx   . Esophageal cancer Neg Hx   . Stomach cancer Neg Hx     ALLERGIES:  is allergic to other, indomethacin, and lactose intolerance (gi).  MEDICATIONS:  Current Outpatient Medications  Medication Sig Dispense Refill  . acetaminophen (TYLENOL) 650 MG CR tablet Take 650 mg by mouth every 8 (eight) hours.    . Artificial Tear Solution (SOOTHE XP) SOLN Place 1 drop into both eyes 2 (two) times daily.    . B Complex-C (B-COMPLEX WITH VITAMIN C) tablet Take 1 tablet by mouth daily.     . Biotin 1 MG CAPS Take 1 capsule by mouth daily.     . Calcium Carbonate-Vitamin D 600-400 MG-UNIT tablet Take 1 tablet by mouth 2 (two) times daily.     . cholecalciferol (VITAMIN D) 1000 units tablet Take 1,000 Units by mouth daily.    . citalopram (CELEXA) 20 MG tablet Take 20 mg by mouth daily.     . folic acid (FOLVITE) 1 MG tablet Take 1 mg by mouth in the morning, at noon, and at bedtime.    Marland Kitchen ipratropium (ATROVENT) 0.06 % nasal spray Place 2 sprays into both nostrils 2 (two) times daily.    . methotrexate (RHEUMATREX) 2.5 MG tablet TAKE 8 TABLETS BY MOUTH ONCE A WEEK.    . Multiple Vitamins-Minerals (PRESERVISION AREDS 2 PO) Take 1 tablet by mouth in the morning and at bedtime.    .  ondansetron (ZOFRAN ODT) 4 MG disintegrating tablet Take 1 tablet (4 mg total) by mouth every 8 (eight) hours as needed for nausea or vomiting. 20 tablet 0  . polyethylene glycol (MIRALAX / GLYCOLAX) 17 g packet Take 17 g by mouth daily as needed for mild constipation or moderate constipation (alaso available OTC). 30 each 0  . rosuvastatin (CRESTOR) 10 MG tablet Take 10 mg by mouth  at bedtime.      No current facility-administered medications for this visit.    REVIEW OF SYSTEMS:   Constitutional: ( - ) fevers, ( - )  chills , ( - ) night sweats Eyes: ( - ) blurriness of vision, ( - ) double vision, ( - ) watery eyes Ears, nose, mouth, throat, and face: ( - ) mucositis, ( - ) sore throat Respiratory: ( - ) cough, ( - ) dyspnea, ( - ) wheezes Cardiovascular: ( - ) palpitation, ( - ) chest discomfort, ( - ) lower extremity swelling Gastrointestinal:  ( - ) nausea, ( - ) heartburn, ( - ) change in bowel habits Skin: ( - ) abnormal skin rashes Lymphatics: ( - ) new lymphadenopathy, ( - ) easy bruising Neurological: ( - ) numbness, ( - ) tingling, ( - ) new weaknesses Behavioral/Psych: ( - ) mood change, ( - ) new changes  All other systems were reviewed with the patient and are negative.  PHYSICAL EXAMINATION: ECOG PERFORMANCE STATUS: 1 - Symptomatic but completely ambulatory  Vitals:   05/17/20 1458  BP: 103/77  Pulse: 73  Resp: 18  Temp: (!) 97.3 F (36.3 C)  SpO2: 99%   Filed Weights   05/17/20 1458  Weight: 145 lb 12.8 oz (66.1 kg)    GENERAL: well appearing elderly Caucasian male in NAD  SKIN: skin color, texture, turgor are normal, no rashes or significant lesions EYES: conjunctiva are pink and non-injected, sclera clear LUNGS: clear to auscultation and percussion with normal breathing effort HEART: regular rate & rhythm and no murmurs and no lower extremity edema ABDOMEN: soft, non-tender, non-distended, normal bowel sounds. No HSM appreciated. Musculoskeletal: no  cyanosis of digits and no clubbing  PSYCH: alert & oriented x 3, fluent speech NEURO: no focal motor/sensory deficits  LABORATORY DATA:  I have reviewed the data as listed CBC Latest Ref Rng & Units 05/17/2020 03/01/2020 02/29/2020  WBC 4.0 - 10.5 K/uL 6.7 5.7 5.4  Hemoglobin 13.0 - 17.0 g/dL 11.7(L) 9.6(L) 10.7(L)  Hematocrit 39.0 - 52.0 % 35.4(L) 30.0(L) 33.3(L)  Platelets 150 - 400 K/uL 247 215 234    CMP Latest Ref Rng & Units 05/17/2020 03/01/2020 02/29/2020  Glucose 70 - 99 mg/dL 180(H) 152(H) 121(H)  BUN 8 - 23 mg/dL 18 5(L) 11  Creatinine 0.61 - 1.24 mg/dL 0.90 0.76 0.69  Sodium 135 - 145 mmol/L 139 136 134(L)  Potassium 3.5 - 5.1 mmol/L 4.0 3.1(L) 3.5  Chloride 98 - 111 mmol/L 103 103 99  CO2 22 - 32 mmol/L 28 29 24   Calcium 8.9 - 10.3 mg/dL 9.0 8.2(L) 8.1(L)  Total Protein 6.5 - 8.1 g/dL 7.4 - 6.3(L)  Total Bilirubin 0.3 - 1.2 mg/dL 0.5 - 1.2  Alkaline Phos 38 - 126 U/L 51 - 37(L)  AST 15 - 41 U/L 18 - 16  ALT 0 - 44 U/L 15 - 16    No results found for: MPROTEIN Lab Results  Component Value Date   KPAFRELGTCHN 16.5 06/14/2019   LAMBDASER 14.3 06/14/2019   KAPLAMBRATIO 1.15 06/14/2019    RADIOGRAPHIC STUDIES: No results found.  Steven [name] 86 y.o. male with medical history significant for iron deficiency anemia who presents for a follow up visit.  After review of the labs and discussion with the patient the findings are most consistent with continued iron deficiency anemia.  On exam today Mr. Ny notes that he has good energy levels and exercise tolerance.  His hemoglobin levels have improved from his last visit and subsequent hospitalizations without intervention. He has not noticed any overt signs of bleeding.  He had to stop his iron pills, but does still eat meat predominantly in the form of pork and chicken.  Otherwise he is virtually asymptomatic at this time.  GI evaluations: EGD 03/11/2017: normal stomach and esophagus. Mild duodenal  stenosis Colonoscopy 11/17/2013: moderate diverticulosis  #Normocytic Anemia --increase in Hgb to 11.7 on check today, up from 9.6 during hospitalization in Nov 2021 for SBO --patient has to stop his ferrous sulfate 325mg  PO daily due to recurrent SBOs, as recommended by his surgical team.  --repeat Iron panel, ferritin, and reticulocyte panel today.  --will recommend IV iron to the patient if iron levels remain low  --may need to consider further GI evaluation if Hgb persistently declines. Mr. Hopf previously had a positive FOB card --RTC in 3 months or sooner if IV iron is required .   No orders of the defined types were placed in this encounter.   All questions were answered. The patient knows to call the clinic with any problems, questions or concerns.  A total of more than 30 minutes were spent on this encounter and over half of that time was spent on counseling and coordination of care as outlined above.   Ledell Peoples, MD Department of Hematology/Oncology [place] at [facility] Phone: [phone] Pager: [phone] Email: [email or web address]  05/17/2020 3:31 PM

## 2020-05-20 LAB — IRON AND TIBC
Iron: 53 ug/dL (ref 42–163)
Saturation Ratios: 14 % — ABNORMAL LOW (ref 20–55)
TIBC: 376 ug/dL (ref 202–409)
UIBC: 323 ug/dL (ref 117–376)

## 2020-05-20 LAB — FERRITIN: Ferritin: 32 ng/mL (ref 24–336)

## 2020-05-22 ENCOUNTER — Telehealth: Payer: Self-pay | Admitting: Oncology

## 2020-05-22 ENCOUNTER — Telehealth: Payer: Self-pay | Admitting: *Deleted

## 2020-05-22 NOTE — Telephone Encounter (Signed)
-----   Message from Ledell Peoples IV, MD sent at 05/20/2020 10:14 AM EST ----- Please let Steven [name] know that his iron levels are moderately low, but that his Hgb is near normal at 11.6. Given that he is feeling well I do not think he requires IV iron at this time, but if he were to have fatigue/shortness of breath we could reconsider. We will plan to see him back in 3 months time.   ----- Message ----- From: Buel Ream, Lab In [place] Sent: 05/17/2020   2:48 PM EST To: Orson Slick, MD

## 2020-05-22 NOTE — Telephone Encounter (Signed)
Scheduled appts per 2/4 los. Left voicemail with appt date and time.

## 2020-05-22 NOTE — Telephone Encounter (Signed)
TCT patient regarding recent lab results.  No answer but was able to leave vm message for pt and advised that his iron levels were moderately low but his HGB was close to normal 2 11.6. Dr. Lorenso Courier does not advise IV iron at this time. Advised to call if he experiences any increased fatigue or SOB.  We will see him back in 3 months.

## 2020-05-27 DIAGNOSIS — E785 Hyperlipidemia, unspecified: Secondary | ICD-10-CM | POA: Diagnosis not present

## 2020-05-27 DIAGNOSIS — E1122 Type 2 diabetes mellitus with diabetic chronic kidney disease: Secondary | ICD-10-CM | POA: Diagnosis not present

## 2020-06-03 DIAGNOSIS — I440 Atrioventricular block, first degree: Secondary | ICD-10-CM | POA: Diagnosis not present

## 2020-06-03 DIAGNOSIS — M7918 Myalgia, other site: Secondary | ICD-10-CM | POA: Diagnosis not present

## 2020-06-03 DIAGNOSIS — I6521 Occlusion and stenosis of right carotid artery: Secondary | ICD-10-CM | POA: Diagnosis not present

## 2020-06-03 DIAGNOSIS — D509 Iron deficiency anemia, unspecified: Secondary | ICD-10-CM | POA: Diagnosis not present

## 2020-06-03 DIAGNOSIS — E118 Type 2 diabetes mellitus with unspecified complications: Secondary | ICD-10-CM | POA: Diagnosis not present

## 2020-06-03 DIAGNOSIS — Z8719 Personal history of other diseases of the digestive system: Secondary | ICD-10-CM | POA: Diagnosis not present

## 2020-06-03 DIAGNOSIS — J309 Allergic rhinitis, unspecified: Secondary | ICD-10-CM | POA: Diagnosis not present

## 2020-06-03 DIAGNOSIS — R3915 Urgency of urination: Secondary | ICD-10-CM | POA: Diagnosis not present

## 2020-06-03 DIAGNOSIS — Z0001 Encounter for general adult medical examination with abnormal findings: Secondary | ICD-10-CM | POA: Diagnosis not present

## 2020-06-03 DIAGNOSIS — M459 Ankylosing spondylitis of unspecified sites in spine: Secondary | ICD-10-CM | POA: Diagnosis not present

## 2020-06-10 NOTE — Telephone Encounter (Signed)
TCT patient and spoke with him about his recent lab results.. Advised that he did not need IV iron at this time and that we will see him back in 3 months. Advised to call us if experiences increased fatigue and/or SOB. Pt voiced understanding and also requested a change to his May appt. Scheduling message sent.

## 2020-06-10 NOTE — Progress Notes (Signed)
143.2 lb  Subjective:   I, Molly Weber, LAT, ATC, am serving as scribe for Dr. Lynne Leader.  I'm seeing this patient as a consultation for Dr. [name]. Note will be routed back to referring provider/PCP.  CC: Right buttock pain  HPI: Pt is a 86 y/o male c/o chronic R-sided buttock pain x several years. Pt locates pain to his R mid buttock.  He has a hx of ankylosing spondylitis and has a hx of 4 THA on the R hip w/ the last one being approximately 5 years ago w/ Dr. Elesa Massed at [facility].     Low back pain: No Radiates: No LE Numbness/tingling: No Aggravates: Walking any distance greater than 0.2 miles; prolonged sitting;  Treatments tried: Tylenol  Dx imaging: 01/01/17 R hip/pelvis XR  Past medical history, Surgical history, Family history, Social history, Allergies, and medications have been entered into the medical record, reviewed.   Review of Systems: No new headache, visual changes, nausea, vomiting, diarrhea, constipation, dizziness, abdominal pain, skin rash, fevers, chills, night sweats, weight loss, swollen lymph nodes, body aches, joint swelling, muscle aches, chest pain, shortness of breath, mood changes, visual or auditory hallucinations.   Objective:    Vitals:   06/11/20 1401  BP: 120/60  Pulse: 69  SpO2: 97%   General: Well Developed, well nourished, and in no acute distress.  Neuro/Psych: Alert and oriented x3, extra-ocular muscles intact, able to move all 4 extremities, sensation grossly intact. Skin: Warm and dry, no rashes noted.  Respiratory: Not using accessory muscles, speaking in full sentences, trachea midline.  Cardiovascular: Pulses palpable, no extremity edema. Abdomen: Does not appear distended. MSK: Right hip decreased muscle bulk buttocks and greater trochanter. Normal motion. Not particularly tender to palpation. Diminished strength to hip abduction and external rotation.  Leg lengths equal with laying position.  Lab and Radiology  Results  X-ray images right hip obtained today personally and independently interpreted. Intact surgical hardware right hip total hip replacement and revision. Osteophyte present at ischium Await formal radiology review  Impression and Recommendations:    Assessment and Plan: 86 y.o. male with right buttocks pain.  Patient has fundamental weakness of his hip abductor and external rotator muscles.  He is a good candidate for physical therapy.  Plan for PT referral and check back in about 6 weeks.  He lives in [place] so [facility] in [place] is a good location for PT.  If this does not work he would like to try physical therapy in [place] area.Marland Kitchen  PDMP not reviewed this encounter. Orders Placed This Encounter  Procedures  . DG HIP UNILAT W OR W/O PELVIS 2-3 VIEWS RIGHT    Standing Status:   Future    Number of Occurrences:   1    Standing Expiration Date:   07/12/2020    Order Specific Question:   Reason for Exam (SYMPTOM  OR DIAGNOSIS REQUIRED)    Answer:   R hip pain    Order Specific Question:   Preferred imaging location?    Answer:   [facility]  . Ambulatory referral to Physical Therapy    Referral Priority:   Routine    Referral Type:   Physical Medicine    Referral Reason:   Specialty Services Required    Requested Specialty:   Physical Therapy    Number of Visits Requested:   1   No orders of the defined types were placed in this encounter.   Discussed warning  signs or symptoms. Please see discharge instructions. Patient expresses understanding.   The above documentation has been reviewed and is accurate and complete Lynne Leader, M.D.

## 2020-06-11 ENCOUNTER — Ambulatory Visit: Payer: Medicare Other | Admitting: Family Medicine

## 2020-06-11 ENCOUNTER — Other Ambulatory Visit: Payer: Self-pay

## 2020-06-11 ENCOUNTER — Ambulatory Visit: Payer: Self-pay

## 2020-06-11 ENCOUNTER — Ambulatory Visit (INDEPENDENT_AMBULATORY_CARE_PROVIDER_SITE_OTHER): Payer: Medicare Other

## 2020-06-11 ENCOUNTER — Encounter: Payer: Self-pay | Admitting: Family Medicine

## 2020-06-11 VITALS — BP 120/60 | HR 69 | Ht 67.0 in | Wt 143.2 lb

## 2020-06-11 DIAGNOSIS — M76891 Other specified enthesopathies of right lower limb, excluding foot: Secondary | ICD-10-CM | POA: Diagnosis not present

## 2020-06-11 DIAGNOSIS — G8929 Other chronic pain: Secondary | ICD-10-CM

## 2020-06-11 DIAGNOSIS — M25551 Pain in right hip: Secondary | ICD-10-CM

## 2020-06-11 NOTE — Patient Instructions (Signed)
Thank you for coming in today.  Please get an Xray today before you leave  I've referred you to Physical Therapy.  Let us know if you don't hear from them in one week.  Recheck in about 6 weeks.   Keep me updated.   Let me know if you have a problem.

## 2020-06-12 NOTE — Progress Notes (Signed)
X-ray right hip shows replacement hardware that looks to be in good shape.  Left hip has medium arthritis but the right side looks okay.

## 2020-06-18 DIAGNOSIS — R3915 Urgency of urination: Secondary | ICD-10-CM | POA: Diagnosis not present

## 2020-07-01 DIAGNOSIS — M6281 Muscle weakness (generalized): Secondary | ICD-10-CM | POA: Diagnosis not present

## 2020-07-01 DIAGNOSIS — M25551 Pain in right hip: Secondary | ICD-10-CM | POA: Diagnosis not present

## 2020-07-05 DIAGNOSIS — M25551 Pain in right hip: Secondary | ICD-10-CM | POA: Diagnosis not present

## 2020-07-05 DIAGNOSIS — M6281 Muscle weakness (generalized): Secondary | ICD-10-CM | POA: Diagnosis not present

## 2020-07-09 DIAGNOSIS — M25551 Pain in right hip: Secondary | ICD-10-CM | POA: Diagnosis not present

## 2020-07-09 DIAGNOSIS — M6281 Muscle weakness (generalized): Secondary | ICD-10-CM | POA: Diagnosis not present

## 2020-07-12 DIAGNOSIS — M6281 Muscle weakness (generalized): Secondary | ICD-10-CM | POA: Diagnosis not present

## 2020-07-12 DIAGNOSIS — M25551 Pain in right hip: Secondary | ICD-10-CM | POA: Diagnosis not present

## 2020-07-15 DIAGNOSIS — M6281 Muscle weakness (generalized): Secondary | ICD-10-CM | POA: Diagnosis not present

## 2020-07-15 DIAGNOSIS — M25551 Pain in right hip: Secondary | ICD-10-CM | POA: Diagnosis not present

## 2020-07-19 DIAGNOSIS — M6281 Muscle weakness (generalized): Secondary | ICD-10-CM | POA: Diagnosis not present

## 2020-07-19 DIAGNOSIS — M25551 Pain in right hip: Secondary | ICD-10-CM | POA: Diagnosis not present

## 2020-07-22 DIAGNOSIS — M6281 Muscle weakness (generalized): Secondary | ICD-10-CM | POA: Diagnosis not present

## 2020-07-22 DIAGNOSIS — M25551 Pain in right hip: Secondary | ICD-10-CM | POA: Diagnosis not present

## 2020-07-23 ENCOUNTER — Encounter: Payer: Self-pay | Admitting: Family Medicine

## 2020-07-23 ENCOUNTER — Ambulatory Visit: Payer: Medicare Other | Admitting: Family Medicine

## 2020-07-23 ENCOUNTER — Other Ambulatory Visit: Payer: Self-pay

## 2020-07-23 VITALS — BP 126/60 | HR 68 | Ht 67.0 in | Wt 145.0 lb

## 2020-07-23 DIAGNOSIS — M76891 Other specified enthesopathies of right lower limb, excluding foot: Secondary | ICD-10-CM | POA: Diagnosis not present

## 2020-07-23 NOTE — Progress Notes (Signed)
   Rito Ehrlich, am serving as a Education administrator for Dr. Lynne Leader.  Steven [name] is a 86 y.o. male who presents to [place] [facility] today for f/u chronic R hip pain. Pt was last seen by Dr. Georgina Snell on 06/11/20 and was referred to PT of which he's completed # 8 visits. Today, pt reports that in general the hip is improving because his Physical therapists have been doing a wonderful job. Patient highly recommends [facility].   Dx imaging: 06/11/20 R hip/pelvis XR  02/28/20 Abdomen/pelvis CT  01/01/17 R hip/pelvis XR  Pertinent review of systems: No fevers or chills  Relevant historical information: Hip replacement right   Exam:  BP 126/60 (BP Location: Left Arm, Patient Position: Sitting, Cuff Size: Normal)   Pulse 68   Ht 5\' 7"  (1.702 m)   Wt 145 lb (65.8 kg)   SpO2 97%   BMI 22.71 kg/m  General: Well Developed, well nourished, and in no acute distress.   MSK: Right hip normal-appearing normal motion    Lab and Radiology Results DG HIP UNILAT W OR W/O PELVIS 2-3 VIEWS RIGHT  Result Date: 06/12/2020 CLINICAL DATA:  Chronic right hip pain.  History of hip replacement. EXAM: DG HIP (WITH OR WITHOUT PELVIS) 2-3V RIGHT COMPARISON:  Reformats from abdominopelvic CT 02/28/2020 FINDINGS: Right hip arthroplasty in expected alignment. No periprosthetic lucency or fracture. Femoral stem is midline. Chronic spurring lateral to the acetabulum. Pubic rami are intact. Pubic symphysis is congruent. Partial ankylosis of both sacroiliac joints. Ossification of the spinous process ligaments in the lower lumbar spine consistent with ankylosing spondylitis. Moderate left hip osteoarthritis. IMPRESSION: 1. Right hip arthroplasty in expected alignment without complication. 2. Moderate left hip osteoarthritis. Electronically Signed   By: Keith Rake M.D.   On: 06/12/2020 12:08   I, Lynne Leader, personally (independently) visualized and performed the interpretation of the  images attached in this note.   Assessment and Plan: 86 y.o. male with right lateral hip pain thought to be due to hip abductor tendinopathy and trochanteric bursitis.  Significantly improved with PT and home exercise program.  Additionally patient had some pain with driving discussed some offloading options with cushions.  Effectively Itay is doing pretty well.  Continue conservative management and recheck back with me as needed.    Discussed warning signs or symptoms. Please see discharge instructions. Patient expresses understanding.   The above documentation has been reviewed and is accurate and complete Lynne Leader, M.D.    Total encounter time 20 minutes including face-to-face time with the patient and, reviewing past medical record, and charting on the date of service.

## 2020-07-23 NOTE — Patient Instructions (Signed)
Thank you for coming in today.  Call or go to the ER if you develop a large red swollen joint with extreme pain or oozing puss.   Continue PT and home exercises.   Recheck with me as needed.   Try to off-load the pressure from the bony prominence with driving.

## 2020-07-24 DIAGNOSIS — M6281 Muscle weakness (generalized): Secondary | ICD-10-CM | POA: Diagnosis not present

## 2020-07-24 DIAGNOSIS — M25551 Pain in right hip: Secondary | ICD-10-CM | POA: Diagnosis not present

## 2020-07-25 DIAGNOSIS — L821 Other seborrheic keratosis: Secondary | ICD-10-CM | POA: Diagnosis not present

## 2020-07-25 DIAGNOSIS — L820 Inflamed seborrheic keratosis: Secondary | ICD-10-CM | POA: Diagnosis not present

## 2020-07-25 DIAGNOSIS — Z86007 Personal history of in-situ neoplasm of skin: Secondary | ICD-10-CM | POA: Diagnosis not present

## 2020-07-25 DIAGNOSIS — L570 Actinic keratosis: Secondary | ICD-10-CM | POA: Diagnosis not present

## 2020-07-25 DIAGNOSIS — L814 Other melanin hyperpigmentation: Secondary | ICD-10-CM | POA: Diagnosis not present

## 2020-07-25 DIAGNOSIS — L578 Other skin changes due to chronic exposure to nonionizing radiation: Secondary | ICD-10-CM | POA: Diagnosis not present

## 2020-07-30 DIAGNOSIS — M25551 Pain in right hip: Secondary | ICD-10-CM | POA: Diagnosis not present

## 2020-07-30 DIAGNOSIS — M6281 Muscle weakness (generalized): Secondary | ICD-10-CM | POA: Diagnosis not present

## 2020-08-06 DIAGNOSIS — M25551 Pain in right hip: Secondary | ICD-10-CM | POA: Diagnosis not present

## 2020-08-06 DIAGNOSIS — M6281 Muscle weakness (generalized): Secondary | ICD-10-CM | POA: Diagnosis not present

## 2020-08-13 ENCOUNTER — Other Ambulatory Visit: Payer: Self-pay | Admitting: Hematology and Oncology

## 2020-08-13 DIAGNOSIS — D500 Iron deficiency anemia secondary to blood loss (chronic): Secondary | ICD-10-CM

## 2020-08-13 DIAGNOSIS — M25551 Pain in right hip: Secondary | ICD-10-CM | POA: Diagnosis not present

## 2020-08-13 DIAGNOSIS — M6281 Muscle weakness (generalized): Secondary | ICD-10-CM | POA: Diagnosis not present

## 2020-08-13 NOTE — Progress Notes (Signed)
Rescheduled

## 2020-08-14 ENCOUNTER — Inpatient Hospital Stay: Payer: Medicare Other

## 2020-08-14 ENCOUNTER — Inpatient Hospital Stay: Payer: Medicare Other | Admitting: Hematology and Oncology

## 2020-08-14 ENCOUNTER — Telehealth: Payer: Self-pay | Admitting: Hematology and Oncology

## 2020-08-14 DIAGNOSIS — D649 Anemia, unspecified: Secondary | ICD-10-CM

## 2020-08-14 DIAGNOSIS — D500 Iron deficiency anemia secondary to blood loss (chronic): Secondary | ICD-10-CM

## 2020-08-14 NOTE — Telephone Encounter (Signed)
R/s appts per 5/4 sch msg. Pt aware.  

## 2020-08-19 ENCOUNTER — Ambulatory Visit: Payer: Medicare Other | Admitting: Hematology and Oncology

## 2020-08-19 ENCOUNTER — Other Ambulatory Visit: Payer: Medicare Other

## 2020-08-20 DIAGNOSIS — M25551 Pain in right hip: Secondary | ICD-10-CM | POA: Diagnosis not present

## 2020-08-20 DIAGNOSIS — M6281 Muscle weakness (generalized): Secondary | ICD-10-CM | POA: Diagnosis not present

## 2020-08-27 DIAGNOSIS — M6281 Muscle weakness (generalized): Secondary | ICD-10-CM | POA: Diagnosis not present

## 2020-08-27 DIAGNOSIS — M25551 Pain in right hip: Secondary | ICD-10-CM | POA: Diagnosis not present

## 2020-09-03 DIAGNOSIS — M25551 Pain in right hip: Secondary | ICD-10-CM | POA: Diagnosis not present

## 2020-09-03 DIAGNOSIS — M6281 Muscle weakness (generalized): Secondary | ICD-10-CM | POA: Diagnosis not present

## 2020-09-05 ENCOUNTER — Inpatient Hospital Stay: Payer: Medicare Other | Attending: Hematology and Oncology | Admitting: Hematology and Oncology

## 2020-09-05 ENCOUNTER — Encounter: Payer: Self-pay | Admitting: Hematology and Oncology

## 2020-09-05 ENCOUNTER — Other Ambulatory Visit: Payer: Self-pay

## 2020-09-05 ENCOUNTER — Inpatient Hospital Stay: Payer: Medicare Other

## 2020-09-05 VITALS — BP 111/68 | HR 59 | Temp 97.7°F | Resp 17 | Ht 67.0 in | Wt 142.3 lb

## 2020-09-05 DIAGNOSIS — D500 Iron deficiency anemia secondary to blood loss (chronic): Secondary | ICD-10-CM

## 2020-09-05 DIAGNOSIS — E785 Hyperlipidemia, unspecified: Secondary | ICD-10-CM | POA: Diagnosis not present

## 2020-09-05 DIAGNOSIS — Z79899 Other long term (current) drug therapy: Secondary | ICD-10-CM | POA: Diagnosis not present

## 2020-09-05 DIAGNOSIS — Z87891 Personal history of nicotine dependence: Secondary | ICD-10-CM | POA: Diagnosis not present

## 2020-09-05 DIAGNOSIS — K315 Obstruction of duodenum: Secondary | ICD-10-CM | POA: Diagnosis not present

## 2020-09-05 DIAGNOSIS — Z87442 Personal history of urinary calculi: Secondary | ICD-10-CM | POA: Diagnosis not present

## 2020-09-05 DIAGNOSIS — D649 Anemia, unspecified: Secondary | ICD-10-CM

## 2020-09-05 DIAGNOSIS — D509 Iron deficiency anemia, unspecified: Secondary | ICD-10-CM | POA: Insufficient documentation

## 2020-09-05 DIAGNOSIS — Z803 Family history of malignant neoplasm of breast: Secondary | ICD-10-CM | POA: Diagnosis not present

## 2020-09-05 LAB — CMP (CANCER CENTER ONLY)
ALT: 10 U/L (ref 0–44)
AST: 15 U/L (ref 15–41)
Albumin: 3.7 g/dL (ref 3.5–5.0)
Alkaline Phosphatase: 44 U/L (ref 38–126)
Anion gap: 7 (ref 5–15)
BUN: 14 mg/dL (ref 8–23)
CO2: 30 mmol/L (ref 22–32)
Calcium: 9 mg/dL (ref 8.9–10.3)
Chloride: 104 mmol/L (ref 98–111)
Creatinine: 0.82 mg/dL (ref 0.61–1.24)
GFR, Estimated: >60 mL/min (ref >60)
Glucose, Bld: 95 mg/dL (ref 70–99)
Potassium: 3.8 mmol/L (ref 3.5–5.1)
Sodium: 141 mmol/L (ref 135–145)
Total Bilirubin: 0.3 mg/dL (ref 0.3–1.2)
Total Protein: 7 g/dL (ref 6.5–8.1)

## 2020-09-05 LAB — RETIC PANEL
Immature Retic Fract: 10.4 % (ref 2.3–15.9)
RBC.: 3.48 MIL/uL — ABNORMAL LOW (ref 4.22–5.81)
Retic Count, Absolute: 32.4 10*3/uL (ref 19.0–186.0)
Retic Ct Pct: 0.9 % (ref 0.4–3.1)
Reticulocyte Hemoglobin: 36.3 pg (ref >27.9)

## 2020-09-05 LAB — CBC WITH DIFFERENTIAL (CANCER CENTER ONLY)
Abs Immature Granulocytes: 0.01 10*3/uL (ref 0.00–0.07)
Basophils Absolute: 0 10*3/uL (ref 0.0–0.1)
Basophils Relative: 1 %
Eosinophils Absolute: 0.2 10*3/uL (ref 0.0–0.5)
Eosinophils Relative: 3 %
HCT: 33 % — ABNORMAL LOW (ref 39.0–52.0)
Hemoglobin: 10.9 g/dL — ABNORMAL LOW (ref 13.0–17.0)
Immature Granulocytes: 0 %
Lymphocytes Relative: 16 %
Lymphs Abs: 1.1 10*3/uL (ref 0.7–4.0)
MCH: 31.2 pg (ref 26.0–34.0)
MCHC: 33 g/dL (ref 30.0–36.0)
MCV: 94.6 fL (ref 80.0–100.0)
Monocytes Absolute: 0.5 10*3/uL (ref 0.1–1.0)
Monocytes Relative: 8 %
Neutro Abs: 4.8 10*3/uL (ref 1.7–7.7)
Neutrophils Relative %: 72 %
Platelet Count: 259 10*3/uL (ref 150–400)
RBC: 3.49 MIL/uL — ABNORMAL LOW (ref 4.22–5.81)
RDW: 14.5 % (ref 11.5–15.5)
WBC Count: 6.6 10*3/uL (ref 4.0–10.5)
nRBC: 0 % (ref 0.0–0.2)

## 2020-09-05 LAB — IRON AND TIBC
Iron: 31 ug/dL — ABNORMAL LOW (ref 42–163)
Saturation Ratios: 8 % — ABNORMAL LOW (ref 20–55)
TIBC: 388 ug/dL (ref 202–409)
UIBC: 357 ug/dL (ref 117–376)

## 2020-09-05 LAB — FERRITIN: Ferritin: 17 ng/mL — ABNORMAL LOW (ref 24–336)

## 2020-09-05 NOTE — Progress Notes (Signed)
[name] [phone]   [phone]  PROGRESS NOTE  Patient Care Team: [name], MD as PCP - General (Internal Medicine)  Hematological/Oncological History #Normocytic Anemia #Iron Deficiency Anemia 1) 01/01/2017: WBC 5.1, Hgb 11.0, Plt 254, MCV 91.6 2) 12/07/2017: WBC 8.0, Hgb 11.9, Plt 352, MCV 91.4. Iron 76, TIBC 359, Sat 21%, Ferritin 31 3) 03/09/2019: WBC 14.3, Hgb 11.1, Plt 374, MCV 88.8 4) 05/22/2019: WBC 5.5, Hgb 10.7, Plt 311, MCV 83.7. Routine PCP visit. 5) 06/14/2019: establish care with Dr. Lorenso Courier. WBC 5.0, Hgb 10.3, MCV 84.9, Plt 320 6) 09/14/2019: WBC 6.2, Hgb 10.9, MCV 91.2, Plt 209 7)  11/01/2019: WBC 9.2, Hgb 12.6, MVC 97.2, Plt 299 8) 11/05/2019: patient admitted with SBO, instructed to stop PO iron indefinitely.  9) 02/28/2020: admitted with SBO, Hgb drop to 9.6 while inpatient 10) 05/17/2020: Hgb 11.7, increased without intervention.  11) 09/05/2020: WBC 6.6, Hgb 10.9, MCV 94.6, Plt 259  Interval History:  Steven [name] 86 y.o. male with medical history significant for iron deficiency anemia who presents for a follow up visit. The patient's last visit was on 05/17/2020 at which time his Hgb increased to 11.7. In the interim since the last visit Steven [name] has not had a hospital admission or any other major changes in health.   On exam today Steven [name] notes that he has been quite well.  He reports that his energy levels have been quite good and his appetite has been fine.  He notes that he is careful not to overload himself on food due to concern for his SBO.  He notes that he does have good bowel movements.  He did recently have an intestinal virus last week which has recovered on its own.  He notes that he does not have any issues with shortness of breath and he has been working with physical therapy to improve his mobility.  He has not had any signs or symptoms of GI bleed or bleeding elsewhere.  He denies having any issues with fevers,  chills, sweats, nausea, vomiting or diarrhea.  He denies seeing any overt signs of blood in his stool.  A full 10 point ROS is listed below.  MEDICAL HISTORY:  Past Medical History:  Diagnosis Date  . Allergy    enviromental  . Anemia 2017  . Anxiety   . Arthritis    ankylosing spodilitis  . Blood transfusion without reported diagnosis    during surgery  . Cataract   . Depression   . Dyspnea    with exertion - had Echo done 09/29/16  . History of kidney stones   . Hyperlipidemia   . Intestinal obstruction ([place])   . Pneumonia    as a child    SURGICAL HISTORY: Past Surgical History:  Procedure Laterality Date  . ABDOMINAL SURGERY     had abcess  . APPENDECTOMY    . CHOLECYSTECTOMY     with lysis of adhesions  . COLONOSCOPY    . COLOSTOMY    . COLOSTOMY REVERSAL    . EYE SURGERY Left    scar tissue removed from cornea  . EYE SURGERY Right    cataract surgery with lens implant  . JOINT REPLACEMENT Right    hip  x 2 1999 and 2007  . SHOULDER ARTHROSCOPY WITH ROTATOR CUFF REPAIR AND SUBACROMIAL DECOMPRESSION Left 03/02/2013   Procedure: LEFT SHOULDER ARTHROSCOPY WITH SUBACROMIAL DECOMPRESSION DISTAL CALVICLE RESECTION AND ROTATOR CUFF REPAIR ;  Surgeon: [name],  MD;  Location: [facility];  Service: Orthopedics;  Laterality: Left;  . TOTAL HIP REVISION Right 11/06/2016   Procedure: TOTAL HIP REVISION OF THE ACETABULAR COMPONENT;  Surgeon: [name], MD;  Location: [facility];  Service: Orthopedics;  Laterality: Right;    SOCIAL HISTORY: Social History   Socioeconomic History  . Marital status: Married    Spouse name: Not on file  . Number of children: 2  . Years of education: Not on file  . Highest education level: Not on file  Occupational History  . Occupation: retired  Tobacco Use  . Smoking status: Former Research scientist (life sciences)  . Smokeless tobacco: Never Used  Vaping Use  . Vaping Use: Never used  Substance and Sexual Activity  . Alcohol use: Yes    Comment: 3 beers or  glasses of wine a day--reports stopped ETOH 11/01/16   . Drug use: No  . Sexual activity: Never  Other Topics Concern  . Not on file  Social History Narrative  . Not on file   Social Determinants of Health   Financial Resource Strain: Not on file  Food Insecurity: Not on file  Transportation Needs: Not on file  Physical Activity: Not on file  Stress: Not on file  Social Connections: Not on file  Intimate Partner Violence: Not on file    FAMILY HISTORY: Family History  Problem Relation Age of Onset  . Heart attack Mother   . Diabetes Mother   . Breast cancer Mother   . Heart attack Maternal Grandfather   . Pancreatic cancer Brother   . Colon cancer Neg Hx   . Esophageal cancer Neg Hx   . Stomach cancer Neg Hx     ALLERGIES:  is allergic to other, indomethacin, and lactose intolerance (gi).  MEDICATIONS:  Current Outpatient Medications  Medication Sig Dispense Refill  . acetaminophen (TYLENOL) 650 MG CR tablet Take 650 mg by mouth every 8 (eight) hours.    . Artificial Tear Solution (SOOTHE XP) SOLN Place 1 drop into both eyes 2 (two) times daily.    . B Complex-C (B-COMPLEX WITH VITAMIN C) tablet Take 1 tablet by mouth daily.     . Biotin 1 MG CAPS Take 1 capsule by mouth daily.     . Calcium Carbonate-Vitamin D 600-400 MG-UNIT tablet Take 1 tablet by mouth 2 (two) times daily.     . cholecalciferol (VITAMIN D) 1000 units tablet Take 1,000 Units by mouth daily.    . citalopram (CELEXA) 20 MG tablet Take 20 mg by mouth daily.     . folic acid (FOLVITE) 1 MG tablet Take 1 mg by mouth in the morning, at noon, and at bedtime.    Marland Kitchen ipratropium (ATROVENT) 0.06 % nasal spray Place 2 sprays into both nostrils 2 (two) times daily.    . methotrexate (RHEUMATREX) 2.5 MG tablet TAKE 8 TABLETS BY MOUTH ONCE A WEEK.    . Multiple Vitamins-Minerals (PRESERVISION AREDS 2 PO) Take 1 tablet by mouth in the morning and at bedtime.    . ondansetron (ZOFRAN ODT) 4 MG disintegrating tablet  Take 1 tablet (4 mg total) by mouth every 8 (eight) hours as needed for nausea or vomiting. 20 tablet 0  . polyethylene glycol (MIRALAX / GLYCOLAX) 17 g packet Take 17 g by mouth daily as needed for mild constipation or moderate constipation (alaso available OTC). 30 each 0  . rosuvastatin (CRESTOR) 10 MG tablet Take 10 mg by mouth at bedtime.      No  current facility-administered medications for this visit.    REVIEW OF SYSTEMS:   Constitutional: ( - ) fevers, ( - )  chills , ( - ) night sweats Eyes: ( - ) blurriness of vision, ( - ) double vision, ( - ) watery eyes Ears, nose, mouth, throat, and face: ( - ) mucositis, ( - ) sore throat Respiratory: ( - ) cough, ( - ) dyspnea, ( - ) wheezes Cardiovascular: ( - ) palpitation, ( - ) chest discomfort, ( - ) lower extremity swelling Gastrointestinal:  ( - ) nausea, ( - ) heartburn, ( - ) change in bowel habits Skin: ( - ) abnormal skin rashes Lymphatics: ( - ) new lymphadenopathy, ( - ) easy bruising Neurological: ( - ) numbness, ( - ) tingling, ( - ) new weaknesses Behavioral/Psych: ( - ) mood change, ( - ) new changes  All other systems were reviewed with the patient and are negative.  PHYSICAL EXAMINATION: ECOG PERFORMANCE STATUS: 1 - Symptomatic but completely ambulatory  Vitals:   09/05/20 1507  BP: 111/68  Pulse: (!) 59  Resp: 17  Temp: 97.7 F (36.5 C)  SpO2: 100%   Filed Weights   09/05/20 1507  Weight: 142 lb 4.8 oz (64.5 kg)    GENERAL: well appearing elderly Caucasian male in NAD  SKIN: skin color, texture, turgor are normal, no rashes or significant lesions EYES: conjunctiva are pink and non-injected, sclera clear LUNGS: clear to auscultation and percussion with normal breathing effort HEART: regular rate & rhythm and no murmurs and no lower extremity edema ABDOMEN: soft, non-tender, non-distended, normal bowel sounds. No HSM appreciated. Musculoskeletal: no cyanosis of digits and no clubbing  PSYCH: alert &  oriented x 3, fluent speech NEURO: no focal motor/sensory deficits  LABORATORY DATA:  I have reviewed the data as listed CBC Latest Ref Rng & Units 09/05/2020 05/17/2020 03/01/2020  WBC 4.0 - 10.5 K/uL 6.6 6.7 5.7  Hemoglobin 13.0 - 17.0 g/dL 10.9(L) 11.7(L) 9.6(L)  Hematocrit 39.0 - 52.0 % 33.0(L) 35.4(L) 30.0(L)  Platelets 150 - 400 K/uL 259 247 215    CMP Latest Ref Rng & Units 09/05/2020 05/17/2020 03/01/2020  Glucose 70 - 99 mg/dL 95 180(H) 152(H)  BUN 8 - 23 mg/dL 14 18 5(L)  Creatinine 0.61 - 1.24 mg/dL 0.82 0.90 0.76  Sodium 135 - 145 mmol/L 141 139 136  Potassium 3.5 - 5.1 mmol/L 3.8 4.0 3.1(L)  Chloride 98 - 111 mmol/L 104 103 103  CO2 22 - 32 mmol/L 30 28 29   Calcium 8.9 - 10.3 mg/dL 9.0 9.0 8.2(L)  Total Protein 6.5 - 8.1 g/dL 7.0 7.4 -  Total Bilirubin 0.3 - 1.2 mg/dL 0.3 0.5 -  Alkaline Phos 38 - 126 U/L 44 51 -  AST 15 - 41 U/L 15 18 -  ALT 0 - 44 U/L 10 15 -    No results found for: MPROTEIN Lab Results  Component Value Date   KPAFRELGTCHN 16.5 06/14/2019   LAMBDASER 14.3 06/14/2019   KAPLAMBRATIO 1.15 06/14/2019    RADIOGRAPHIC STUDIES: No results found.  ASSESSMENT & PLAN SYLVIA KONDRACKI 86 y.o. male with medical history significant for iron deficiency anemia who presents for a follow up visit.  After review of the labs and discussion with the patient the findings are most consistent with continued iron deficiency anemia.  On exam today Steven [name] notes that he has been at his baseline level of health..  His hemoglobin levels have improved from his  last visit and subsequent hospitalizations without intervention. He has not noticed any overt signs of bleeding.  He had to stop his iron pills, but does still eat meat predominantly in the form of pork and chicken.  Otherwise he is virtually asymptomatic at this time.  GI evaluations: EGD 03/11/2017: normal stomach and esophagus. Mild duodenal stenosis Colonoscopy 11/17/2013: moderate diverticulosis  #Normocytic  Anemia --increase in Hgb to 11.7 on prior check, down to 10.9 today --patient has to stop his ferrous sulfate 325mg  PO daily due to recurrent SBOs, as recommended by his surgical team.  --repeat Iron panel, ferritin, and reticulocyte panel today.  --will recommend IV iron to the patient if iron levels remain low  --may need to consider further GI evaluation if Hgb persistently declines. Steven [name] previously had a positive FOB card --RTC in 6 months or sooner if IV iron is required .   No orders of the defined types were placed in this encounter.   All questions were answered. The patient knows to call the clinic with any problems, questions or concerns.  A total of more than 30 minutes were spent on this encounter and over half of that time was spent on counseling and coordination of care as outlined above.   Ledell Peoples, MD Department of Hematology/Oncology [facility] Phone: [phone] Pager: [phone] Email: [email or web address]  09/05/2020 5:22 PM

## 2020-09-08 ENCOUNTER — Other Ambulatory Visit: Payer: Self-pay | Admitting: Hematology and Oncology

## 2020-09-10 ENCOUNTER — Telehealth: Payer: Self-pay | Admitting: Hematology and Oncology

## 2020-09-10 NOTE — Telephone Encounter (Signed)
Scheduled per los. Called and spoke with patient. Confirmed appt 

## 2020-09-11 ENCOUNTER — Telehealth: Payer: Self-pay | Admitting: Hematology and Oncology

## 2020-09-11 NOTE — Telephone Encounter (Signed)
Scheduled appts per 5/29 sch msg. Called pt, no answer. Left msg with appts dates and times.

## 2020-09-12 ENCOUNTER — Telehealth: Payer: Self-pay | Admitting: *Deleted

## 2020-09-12 NOTE — Telephone Encounter (Signed)
-----   Message from Orson Slick, MD sent at 09/08/2020 11:52 AM EDT ----- Please let Steven [name] know that his labs are consistent with continued iron deficiency anemia. He looks like he would benefit from an IV iron infusion. A scheduling request has been placed.   ----- Message ----- From: Buel Ream, Lab In [place] Sent: 09/05/2020   3:00 PM EDT To: Orson Slick, MD

## 2020-09-12 NOTE — Telephone Encounter (Signed)
Patient regarding recent lab results. No answer but was able to leave detailed message on identified answering machine. Advised that he has iron deficient anemia and Dr. Lorenso Courier would like him to get IV iron. Advised that he is scheduled for IV iron on 6/6 and 6/17, 8am and 2 pm respectively. Advise dto call back to [phone] with any questions or concerns.

## 2020-09-16 ENCOUNTER — Inpatient Hospital Stay: Payer: Medicare Other | Attending: Hematology and Oncology

## 2020-09-16 ENCOUNTER — Other Ambulatory Visit: Payer: Self-pay

## 2020-09-16 VITALS — BP 99/58 | HR 64 | Temp 97.8°F | Resp 16

## 2020-09-16 DIAGNOSIS — Z79899 Other long term (current) drug therapy: Secondary | ICD-10-CM | POA: Diagnosis not present

## 2020-09-16 DIAGNOSIS — D509 Iron deficiency anemia, unspecified: Secondary | ICD-10-CM | POA: Insufficient documentation

## 2020-09-16 DIAGNOSIS — K565 Intestinal adhesions [bands], unspecified as to partial versus complete obstruction: Secondary | ICD-10-CM

## 2020-09-16 MED ORDER — SODIUM CHLORIDE 0.9 % IV SOLN
510.0000 mg | Freq: Once | INTRAVENOUS | Status: AC
  Administered 2020-09-16: 510 mg via INTRAVENOUS
  Filled 2020-09-16: qty 17

## 2020-09-16 MED ORDER — SODIUM CHLORIDE 0.9 % IV SOLN
Freq: Once | INTRAVENOUS | Status: AC
  Filled 2020-09-16: qty 250

## 2020-09-16 NOTE — Patient Instructions (Signed)
[facility] ONCOLOGY  Discharge Instructions: Thank you for choosing [facility] to provide your oncology and hematology care.   If you have a lab appointment with the [facility], please go directly to the [facility] and check in at the registration area.   We strive to give you quality time with your provider. You may need to reschedule your appointment if you arrive late (15 or more minutes).  Arriving late affects you and other patients whose appointments are after yours.  Also, if you miss three or more appointments without notifying the office, you may be dismissed from the clinic at the provider's discretion.      For prescription refill requests, have your pharmacy contact our office and allow 72 hours for refills to be completed.    Today you received the following medication:  Feraheme     To help prevent nausea and vomiting after your treatment, we encourage you to take your nausea medication as directed.  BELOW ARE SYMPTOMS THAT SHOULD BE REPORTED IMMEDIATELY: . *FEVER GREATER THAN 100.4 F (38 C) OR HIGHER . *CHILLS OR SWEATING . *NAUSEA AND VOMITING THAT IS NOT CONTROLLED WITH YOUR NAUSEA MEDICATION . *UNUSUAL SHORTNESS OF BREATH . *UNUSUAL BRUISING OR BLEEDING . *URINARY PROBLEMS (pain or burning when urinating, or frequent urination) . *BOWEL PROBLEMS (unusual diarrhea, constipation, pain near the anus) . TENDERNESS IN MOUTH AND THROAT WITH OR WITHOUT PRESENCE OF ULCERS (sore throat, sores in mouth, or a toothache) . UNUSUAL RASH, SWELLING OR PAIN  . UNUSUAL VAGINAL DISCHARGE OR ITCHING   Items with * indicate a potential emergency and should be followed up as soon as possible or go to the Emergency Department if any problems should occur.  Please show the CHEMOTHERAPY ALERT CARD or IMMUNOTHERAPY ALERT CARD at check-in to the Emergency Department and triage nurse.  Should you have questions after your visit or need to cancel or  reschedule your appointment, please contact [facility]  Dept: [phone]  and follow the prompts.  Office hours are 8:00 a.m. to 4:30 p.m. Monday - Friday. Please note that voicemails left after 4:00 p.m. may not be returned until the following business day.  We are closed weekends and major holidays. You have access to a nurse at all times for urgent questions. Please call the main number to the clinic Dept: [phone] and follow the prompts.   For any non-urgent questions, you may also contact your provider using MyChart. We now offer e-Visits for anyone 67 and older to request care online for non-urgent symptoms. For details visit [email or web address].   Also download the MyChart app! Go to the app store, search "MyChart", open the app, select [facility], and log in with your MyChart username and password.  Due to Covid, a mask is required upon entering the hospital/clinic. If you do not have a mask, one will be given to you upon arrival. For doctor visits, patients may have 1 support person aged 44 or older with them. For treatment visits, patients cannot have anyone with them due to current Covid guidelines and our immunocompromised population.  Ferumoxytol injection What is this medicine? FERUMOXYTOL is an iron complex. Iron is used to make healthy red blood cells, which carry oxygen and nutrients throughout the body. This medicine is used to treat iron deficiency anemia. This medicine may be used for other purposes; ask your health care provider or pharmacist if you have questions. COMMON BRAND NAME(S): Feraheme  What should I tell my health care provider before I take this medicine? They need to know if you have any of these conditions:  anemia not caused by low iron levels  high levels of iron in the blood  magnetic resonance imaging (MRI) test scheduled  an unusual or allergic reaction to iron, other medicines, foods, dyes, or  preservatives  pregnant or trying to get pregnant  breast-feeding How should I use this medicine? This medicine is for injection into a vein. It is given by a health care professional in a hospital or clinic setting. Talk to your pediatrician regarding the use of this medicine in children. Special care may be needed. Overdosage: If you think you have taken too much of this medicine contact a poison control center or emergency room at once. NOTE: This medicine is only for you. Do not share this medicine with others. What if I miss a dose? It is important not to miss your dose. Call your doctor or health care professional if you are unable to keep an appointment. What may interact with this medicine? This medicine may interact with the following medications:  other iron products This list may not describe all possible interactions. Give your health care provider a list of all the medicines, herbs, non-prescription drugs, or dietary supplements you use. Also tell them if you smoke, drink alcohol, or use illegal drugs. Some items may interact with your medicine. What should I watch for while using this medicine? Visit your doctor or healthcare professional regularly. Tell your doctor or healthcare professional if your symptoms do not start to get better or if they get worse. You may need blood work done while you are taking this medicine. You may need to follow a special diet. Talk to your doctor. Foods that contain iron include: whole grains/cereals, dried fruits, beans, or peas, leafy green vegetables, and organ meats (liver, kidney). What side effects may I notice from receiving this medicine? Side effects that you should report to your doctor or health care professional as soon as possible:  allergic reactions like skin rash, itching or hives, swelling of the face, lips, or tongue  breathing problems  changes in blood pressure  feeling faint or lightheaded, falls  fever or  chills  flushing, sweating, or hot feelings  swelling of the ankles or feet Side effects that usually do not require medical attention (report to your doctor or health care professional if they continue or are bothersome):  diarrhea  headache  nausea, vomiting  stomach pain This list may not describe all possible side effects. Call your doctor for medical advice about side effects. You may report side effects to FDA at 1-800-FDA-1088. Where should I keep my medicine? This drug is given in a hospital or clinic and will not be stored at home. NOTE: This sheet is a summary. It may not cover all possible information. If you have questions about this medicine, talk to your doctor, pharmacist, or health care provider.  2021 Elsevier/Gold Standard (2016-05-18 20:21:10)

## 2020-09-17 DIAGNOSIS — H44111 Panuveitis, right eye: Secondary | ICD-10-CM | POA: Diagnosis not present

## 2020-09-17 DIAGNOSIS — Z79899 Other long term (current) drug therapy: Secondary | ICD-10-CM | POA: Diagnosis not present

## 2020-09-17 DIAGNOSIS — Z961 Presence of intraocular lens: Secondary | ICD-10-CM | POA: Diagnosis not present

## 2020-09-17 DIAGNOSIS — M452 Ankylosing spondylitis of cervical region: Secondary | ICD-10-CM | POA: Diagnosis not present

## 2020-09-17 DIAGNOSIS — H35711 Central serous chorioretinopathy, right eye: Secondary | ICD-10-CM | POA: Diagnosis not present

## 2020-09-24 DIAGNOSIS — M25551 Pain in right hip: Secondary | ICD-10-CM | POA: Diagnosis not present

## 2020-09-24 DIAGNOSIS — M6281 Muscle weakness (generalized): Secondary | ICD-10-CM | POA: Diagnosis not present

## 2020-09-27 ENCOUNTER — Inpatient Hospital Stay: Payer: Medicare Other

## 2020-09-27 ENCOUNTER — Other Ambulatory Visit: Payer: Self-pay

## 2020-09-27 VITALS — BP 141/93 | HR 61 | Temp 97.5°F | Resp 18

## 2020-09-27 DIAGNOSIS — D509 Iron deficiency anemia, unspecified: Secondary | ICD-10-CM | POA: Diagnosis not present

## 2020-09-27 DIAGNOSIS — K565 Intestinal adhesions [bands], unspecified as to partial versus complete obstruction: Secondary | ICD-10-CM

## 2020-09-27 DIAGNOSIS — Z79899 Other long term (current) drug therapy: Secondary | ICD-10-CM | POA: Diagnosis not present

## 2020-09-27 MED ORDER — SODIUM CHLORIDE 0.9 % IV SOLN
510.0000 mg | Freq: Once | INTRAVENOUS | Status: AC
  Administered 2020-09-27: 510 mg via INTRAVENOUS
  Filled 2020-09-27: qty 510

## 2020-09-27 MED ORDER — SODIUM CHLORIDE 0.9 % IV SOLN
Freq: Once | INTRAVENOUS | Status: AC
  Filled 2020-09-27: qty 250

## 2020-09-27 NOTE — Patient Instructions (Signed)
Ferumoxytol injection What is this medication? FERUMOXYTOL is an iron complex. Iron is used to make healthy red blood cells, which carry oxygen and nutrients throughout the body. This medicine is used totreat iron deficiency anemia. This medicine may be used for other purposes; ask your health care provider orpharmacist if you have questions. COMMON BRAND NAME(S): Feraheme What should I tell my care team before I take this medication? They need to know if you have any of these conditions: anemia not caused by low iron levels high levels of iron in the blood magnetic resonance imaging (MRI) test scheduled an unusual or allergic reaction to iron, other medicines, foods, dyes, or preservatives pregnant or trying to get pregnant breast-feeding How should I use this medication? This medicine is for injection into a vein. It is given by a health careprofessional in a hospital or clinic setting. Talk to your pediatrician regarding the use of this medicine in children.Special care may be needed. Overdosage: If you think you have taken too much of this medicine contact apoison control center or emergency room at once. NOTE: This medicine is only for you. Do not share this medicine with others. What if I miss a dose? It is important not to miss your dose. Call your doctor or health careprofessional if you are unable to keep an appointment. What may interact with this medication? This medicine may interact with the following medications: other iron products This list may not describe all possible interactions. Give your health care provider a list of all the medicines, herbs, non-prescription drugs, or dietary supplements you use. Also tell them if you smoke, drink alcohol, or use illegaldrugs. Some items may interact with your medicine. What should I watch for while using this medication? Visit your doctor or healthcare professional regularly. Tell your doctor or healthcare professional if your  symptoms do not start to get better or if theyget worse. You may need blood work done while you are taking this medicine. You may need to follow a special diet. Talk to your doctor. Foods that contain iron include: whole grains/cereals, dried fruits, beans, or peas, leafy greenvegetables, and organ meats (liver, kidney). What side effects may I notice from receiving this medication? Side effects that you should report to your doctor or health care professionalas soon as possible: allergic reactions like skin rash, itching or hives, swelling of the face, lips, or tongue breathing problems changes in blood pressure feeling faint or lightheaded, falls fever or chills flushing, sweating, or hot feelings swelling of the ankles or feet Side effects that usually do not require medical attention (report to yourdoctor or health care professional if they continue or are bothersome): diarrhea headache nausea, vomiting stomach pain This list may not describe all possible side effects. Call your doctor for medical advice about side effects. You may report side effects to FDA at1-800-FDA-1088. Where should I keep my medication? This drug is given in a hospital or clinic and will not be stored at home. NOTE: This sheet is a summary. It may not cover all possible information. If you have questions about this medicine, talk to your doctor, pharmacist, orhealth care provider.  2022 Elsevier/Gold Standard (2016-05-18 20:21:10)  

## 2020-10-09 DIAGNOSIS — H35713 Central serous chorioretinopathy, bilateral: Secondary | ICD-10-CM | POA: Diagnosis not present

## 2020-10-24 ENCOUNTER — Other Ambulatory Visit: Payer: Self-pay | Admitting: Hematology and Oncology

## 2020-10-24 DIAGNOSIS — D500 Iron deficiency anemia secondary to blood loss (chronic): Secondary | ICD-10-CM

## 2020-10-24 DIAGNOSIS — K565 Intestinal adhesions [bands], unspecified as to partial versus complete obstruction: Secondary | ICD-10-CM

## 2020-10-25 ENCOUNTER — Other Ambulatory Visit: Payer: Self-pay

## 2020-10-25 ENCOUNTER — Inpatient Hospital Stay: Payer: Medicare Other | Attending: Hematology and Oncology

## 2020-10-25 DIAGNOSIS — D509 Iron deficiency anemia, unspecified: Secondary | ICD-10-CM | POA: Insufficient documentation

## 2020-10-25 DIAGNOSIS — D500 Iron deficiency anemia secondary to blood loss (chronic): Secondary | ICD-10-CM

## 2020-10-25 LAB — IRON AND TIBC
Iron: 90 ug/dL (ref 42–163)
Saturation Ratios: 34 % (ref 20–55)
TIBC: 264 ug/dL (ref 202–409)
UIBC: 174 ug/dL (ref 117–376)

## 2020-10-25 LAB — CBC WITH DIFFERENTIAL (CANCER CENTER ONLY)
Abs Immature Granulocytes: 0.03 10*3/uL (ref 0.00–0.07)
Basophils Absolute: 0 10*3/uL (ref 0.0–0.1)
Basophils Relative: 1 %
Eosinophils Absolute: 0.2 10*3/uL (ref 0.0–0.5)
Eosinophils Relative: 4 %
HCT: 35.5 % — ABNORMAL LOW (ref 39.0–52.0)
Hemoglobin: 12 g/dL — ABNORMAL LOW (ref 13.0–17.0)
Immature Granulocytes: 1 %
Lymphocytes Relative: 18 %
Lymphs Abs: 0.9 10*3/uL (ref 0.7–4.0)
MCH: 32.2 pg (ref 26.0–34.0)
MCHC: 33.8 g/dL (ref 30.0–36.0)
MCV: 95.2 fL (ref 80.0–100.0)
Monocytes Absolute: 0.5 10*3/uL (ref 0.1–1.0)
Monocytes Relative: 9 %
Neutro Abs: 3.5 10*3/uL (ref 1.7–7.7)
Neutrophils Relative %: 67 %
Platelet Count: 221 10*3/uL (ref 150–400)
RBC: 3.73 MIL/uL — ABNORMAL LOW (ref 4.22–5.81)
RDW: 15.8 % — ABNORMAL HIGH (ref 11.5–15.5)
WBC Count: 5.2 10*3/uL (ref 4.0–10.5)
nRBC: 0 % (ref 0.0–0.2)

## 2020-10-25 LAB — CMP (CANCER CENTER ONLY)
ALT: 16 U/L (ref 0–44)
AST: 16 U/L (ref 15–41)
Albumin: 3.8 g/dL (ref 3.5–5.0)
Alkaline Phosphatase: 52 U/L (ref 38–126)
Anion gap: 6 (ref 5–15)
BUN: 17 mg/dL (ref 8–23)
CO2: 29 mmol/L (ref 22–32)
Calcium: 9.4 mg/dL (ref 8.9–10.3)
Chloride: 102 mmol/L (ref 98–111)
Creatinine: 0.79 mg/dL (ref 0.61–1.24)
GFR, Estimated: >60 mL/min (ref >60)
Glucose, Bld: 153 mg/dL — ABNORMAL HIGH (ref 70–99)
Potassium: 4.3 mmol/L (ref 3.5–5.1)
Sodium: 137 mmol/L (ref 135–145)
Total Bilirubin: 0.4 mg/dL (ref 0.3–1.2)
Total Protein: 7.4 g/dL (ref 6.5–8.1)

## 2020-10-25 LAB — RETIC PANEL
Immature Retic Fract: 17.4 % — ABNORMAL HIGH (ref 2.3–15.9)
RBC.: 3.73 MIL/uL — ABNORMAL LOW (ref 4.22–5.81)
Retic Count, Absolute: 41.4 10*3/uL (ref 19.0–186.0)
Retic Ct Pct: 1.1 % (ref 0.4–3.1)
Reticulocyte Hemoglobin: 40 pg (ref >27.9)

## 2020-10-25 LAB — FERRITIN: Ferritin: 420 ng/mL — ABNORMAL HIGH (ref 24–336)

## 2020-10-31 DIAGNOSIS — E1122 Type 2 diabetes mellitus with diabetic chronic kidney disease: Secondary | ICD-10-CM | POA: Diagnosis not present

## 2020-10-31 DIAGNOSIS — K582 Mixed irritable bowel syndrome: Secondary | ICD-10-CM | POA: Diagnosis not present

## 2020-11-11 DIAGNOSIS — D649 Anemia, unspecified: Secondary | ICD-10-CM | POA: Diagnosis not present

## 2020-11-14 DIAGNOSIS — J9383 Other pneumothorax: Secondary | ICD-10-CM | POA: Diagnosis not present

## 2020-11-14 DIAGNOSIS — M25512 Pain in left shoulder: Secondary | ICD-10-CM | POA: Diagnosis not present

## 2020-11-14 DIAGNOSIS — R079 Chest pain, unspecified: Secondary | ICD-10-CM | POA: Diagnosis not present

## 2020-11-15 DIAGNOSIS — J939 Pneumothorax, unspecified: Secondary | ICD-10-CM | POA: Diagnosis not present

## 2020-11-18 ENCOUNTER — Encounter: Payer: Self-pay | Admitting: Student

## 2020-12-09 ENCOUNTER — Encounter: Payer: Self-pay | Admitting: Internal Medicine

## 2020-12-16 NOTE — Progress Notes (Signed)
Synopsis: Referred for spontaneous pneumothorax by [name], MD  Subjective:   PATIENT ID: [place]: male DOB: [birth year], MRN: [id number]  Chief Complaint  Patient presents with   Consult    Pt states pain on left scapula x31month    86yM with history of ankylosing spondylitis - not on anything presently and no recent lung function tests, former smoker referred for spontaneous pneumothorax.  He woke up one morning and had pain behind left clavicle which persisted, starting in early July or August but he's unsure of the date. He hasn't had any worsened dyspnea. No recent preceding trauma. His CP has resolved not long after he went to see Dr. PShelia Media   He has never previously had a pneumothorax.  I called and spoke with Dr. PShelia Mediathis afternoon. He says that while the official radiology read was that there was no pneumothorax on an x-ray 11/15/20, he thought there may be subtle left apical PTX and that's why he [place] to [place] We will request the images.    Otherwise pertinent review of systems is negative.  [place] Never has lived outside of [place]. Worked in [employer] Smoked for 10 years, 1ppd, quit in 1967.  He has no family history of lung disease.     Past Medical History:  Diagnosis Date   Allergy    enviromental   Anemia 2017   Anxiety    Arthritis    ankylosing spodilitis   Blood transfusion without reported diagnosis    during surgery   Cataract    Depression    Dyspnea    with exertion - had Echo done 09/29/16   History of kidney stones    Hyperlipidemia    Intestinal obstruction (HCC)    Pneumonia    as a child     Family History  Problem Relation Age of Onset   Heart attack Mother    Diabetes Mother    Breast cancer Mother    Heart attack Maternal Grandfather    Pancreatic cancer Brother    Colon cancer Neg Hx    Esophageal cancer Neg Hx    Stomach cancer Neg Hx      Past Surgical History:  Procedure Laterality Date    ABDOMINAL SURGERY     had abcess   APPENDECTOMY     CHOLECYSTECTOMY     with lysis of adhesions   COLONOSCOPY     COLOSTOMY     COLOSTOMY REVERSAL     EYE SURGERY Left    scar tissue removed from cornea   EYE SURGERY Right    cataract surgery with lens implant   JOINT REPLACEMENT Right    hip  x 2 1999 and 2007   SHOULDER ARTHROSCOPY WITH ROTATOR CUFF REPAIR AND SUBACROMIAL DECOMPRESSION Left 03/02/2013   Procedure: LEFT SHOULDER ARTHROSCOPY WITH SUBACROMIAL DECOMPRESSION DISTAL CALVICLE RESECTION AND ROTATOR CUFF REPAIR ;  Surgeon: KMarin Shutter MD;  [facility]  Service: Orthopedics;  Laterality: Left;   TOTAL HIP REVISION Right 11/06/2016   Procedure: TOTAL HIP REVISION OF THE ACETABULAR COMPONENT;  Surgeon: RFrederik Pear MD;  [facility]  Service: Orthopedics;  Laterality: Right;    Social History   Socioeconomic History   Marital status: Married    Spouse name: Not on file   Number of children: 2   Years of education: Not on file   Highest education level: Not on file  Occupational History   Occupation: retired  Tobacco Use   Smoking status: Former   Smokeless tobacco: Never  Vaping Use   Vaping Use: Never used  Substance and Sexual Activity   Alcohol use: Yes    Comment: 3 beers or glasses of wine a day--reports stopped ETOH 11/01/16    Drug use: No   Sexual activity: Never  Other Topics Concern   Not on file  Social History Narrative   Not on file   Social Determinants of Health   Financial Resource Strain: Not on file  Food Insecurity: Not on file  Transportation Needs: Not on file  Physical Activity: Not on file  Stress: Not on file  Social Connections: Not on file  Intimate Partner Violence: Not on file     Allergies  Allergen Reactions   Other Hives   Indomethacin Hives and Other (See Comments)   Lactose Intolerance (Gi)     GI UPSET     Outpatient Medications Prior to Visit  Medication Sig Dispense Refill   acetaminophen  (TYLENOL) 650 MG CR tablet Take 650 mg by mouth every 8 (eight) hours.     Artificial Tear Solution (SOOTHE XP) SOLN Place 1 drop into both eyes 2 (two) times daily.     B Complex-C (B-COMPLEX WITH VITAMIN C) tablet Take 1 tablet by mouth daily.      Biotin 1 MG CAPS Take 1 capsule by mouth daily.      Calcium Carbonate-Vitamin D 600-400 MG-UNIT tablet Take 1 tablet by mouth 2 (two) times daily.      cholecalciferol (VITAMIN D) 1000 units tablet Take 1,000 Units by mouth daily.     citalopram (CELEXA) 20 MG tablet Take 20 mg by mouth daily.      folic acid (FOLVITE) 1 MG tablet Take 1 mg by mouth in the morning, at noon, and at bedtime.     ipratropium (ATROVENT) 0.06 % nasal spray Place 2 sprays into both nostrils 2 (two) times daily.     methotrexate (RHEUMATREX) 2.5 MG tablet TAKE 8 TABLETS BY MOUTH ONCE A WEEK.     Multiple Vitamins-Minerals (PRESERVISION AREDS 2 PO) Take 1 tablet by mouth in the morning and at bedtime.     rosuvastatin (CRESTOR) 10 MG tablet Take 10 mg by mouth at bedtime.      polyethylene glycol (MIRALAX / GLYCOLAX) 17 g packet Take 17 g by mouth daily as needed for mild constipation or moderate constipation (alaso available OTC). 30 each 0   ondansetron (ZOFRAN ODT) 4 MG disintegrating tablet Take 1 tablet (4 mg total) by mouth every 8 (eight) hours as needed for nausea or vomiting. (Patient not taking: Reported on 12/17/2020) 20 tablet 0   No facility-administered medications prior to visit.       Objective:   Physical Exam:  General appearance: 86 y.o., male, NAD, conversant  Eyes: anicteric sclerae, moist conjunctivae; no lid-lag; PERRL, tracking appropriately HENT: NCAT; oropharynx, MMM, no mucosal ulcerations; normal hard and soft palate Neck: Trachea midline; no lymphadenopathy, no JVD Lungs: diminished bilaterally, no crackles, no wheeze, with normal respiratory effort. Marked kyphosis CV: RRR, no MRGs  Abdomen: Soft, non-tender; non-distended, BS present   Extremities: No peripheral edema, radial and DP pulses present bilaterally  Skin: Normal temperature, turgor and texture; no rash Psych: Appropriate affect Neuro: Alert and oriented to person and place, no focal deficit    Vitals:   12/17/20 1447  BP: 118/60  Pulse: 67  Temp: 98.4 F (36.9 C)  TempSrc: Oral  SpO2:  98%  Weight: 142 lb 3.2 oz (64.5 kg)  Height: '5\' 7"'$  (1.702 m)   98% on  RA BMI Readings from Last 3 Encounters:  12/17/20 22.27 kg/m  09/05/20 22.29 kg/m  07/23/20 22.71 kg/m   Wt Readings from Last 3 Encounters:  12/17/20 142 lb 3.2 oz (64.5 kg)  09/05/20 142 lb 4.8 oz (64.5 kg)  07/23/20 145 lb (65.8 kg)     CBC    Component Value Date/Time   WBC 5.2 10/25/2020 1213   WBC 5.7 03/01/2020 0342   RBC 3.73 (L) 10/25/2020 1213   RBC 3.73 (L) 10/25/2020 1212   HGB 12.0 (L) 10/25/2020 1213   HGB 13.0 04/20/2012 0425   HCT 35.5 (L) 10/25/2020 1213   HCT 38.5 (L) 04/20/2012 0425   PLT 221 10/25/2020 1213   PLT 207 04/20/2012 0425   MCV 95.2 10/25/2020 1213   MCV 95 04/20/2012 0425   MCH 32.2 10/25/2020 1213   MCHC 33.8 10/25/2020 1213   RDW 15.8 (H) 10/25/2020 1213   RDW 13.2 04/20/2012 0425   LYMPHSABS 0.9 10/25/2020 1213   LYMPHSABS 0.9 (L) 04/20/2012 0425   MONOABS 0.5 10/25/2020 1213   MONOABS 0.6 04/20/2012 0425   EOSABS 0.2 10/25/2020 1213   EOSABS 0.2 04/20/2012 0425   BASOSABS 0.0 10/25/2020 1213   BASOSABS 0.0 04/20/2012 0425      Chest Imaging: CT A/P 02/2020 reviewed by me and lung bases are unremarkable  CXR today reviewed by me with possible left apical PTX but I'm not 100% convinced I see one at all, awaiting final radiology read.  Pulmonary Functions Testing Results: No flowsheet data found.      Assessment & Plan:   # Possible left apical spontaneous pneumothorax: if present on original CXR or CT Chest today then it would likely be secondary spontaneous pneumothorax in setting of ankylosing spondylitis and advanced age.  I think he could potentially be candidate for VATS pleurodesis despite his age for definitive management since he's a relatively functional guy but PFTs would probably help assess risk of surgery given his likely AS-related restriction - and I'd probably hold off on PFTs for about 6 weeks after resolution of his pneumothorax. Obviously if after review of original CXR 11/15/20 at Dr. Pennie Banter office and final reads of today's CXR and upcoming CT Chest, there never was pneumothorax then no need to consider measures to prevent recurrence at all.  Plan: - CT Chest ordered today, request CXR images from 11/14/20, 11/15/20  - final read of today's CXR pending - will call after reviewing all of this - ED precautions for dyspnea, worsening CP given, counseled to avoid air travel for now until we confirm whether there is/was PTX.   I spent 70 minutes dedicated to the care of this patient on the date of this encounter to include pre-visit review of records, personally calling Dr. Pennie Banter office and discussing his case and imaging, face-to-face time with the patient discussing conditions above, post visit ordering of testing, clinical documentation with the electronic health record, and communicating necessary findings to members of the patients care team.         Maryjane Hurter, MD [facility] 12/17/2020 3:10 PM

## 2020-12-17 ENCOUNTER — Encounter: Payer: Self-pay | Admitting: Student

## 2020-12-17 ENCOUNTER — Ambulatory Visit: Payer: Medicare Other | Admitting: Student

## 2020-12-17 ENCOUNTER — Other Ambulatory Visit: Payer: Self-pay

## 2020-12-17 ENCOUNTER — Ambulatory Visit (INDEPENDENT_AMBULATORY_CARE_PROVIDER_SITE_OTHER): Payer: Medicare Other

## 2020-12-17 VITALS — BP 118/60 | HR 67 | Temp 98.4°F | Ht 67.0 in | Wt 142.2 lb

## 2020-12-17 DIAGNOSIS — J9383 Other pneumothorax: Secondary | ICD-10-CM

## 2020-12-17 DIAGNOSIS — J439 Emphysema, unspecified: Secondary | ICD-10-CM | POA: Diagnosis not present

## 2020-12-17 NOTE — Patient Instructions (Addendum)
-   Will review CXR from Dr. Pennie Banter office and will confirm whether or not there appeared to be pneumothorax then.  - Let's check a CT Chest, I'll call with results - this will help [place] see if there are any underlying structural abnormalities that increase your risk for recurrence. After reviewing this and your prior chest x-ray, I'll let you know if we need to pursue PFTs (pulmonary function tests) and a referral to CT surgery to discuss whether or not you would be a candidate for VATS pleurodesis (procedure to prevent recurrence).

## 2020-12-18 ENCOUNTER — Telehealth: Payer: Self-pay | Admitting: Student

## 2020-12-18 NOTE — Addendum Note (Signed)
Addended by: Maryjane Hurter on: 12/18/2020 05:00 PM   Modules accepted: Orders

## 2020-12-18 NOTE — Telephone Encounter (Signed)
Pt dropped off disc of his CXR for You.  This was placed in your box for your review.  FYI.

## 2020-12-18 NOTE — Telephone Encounter (Signed)
Reviewed CXR from 11/15/20. There is no pneumothorax - never had pneumothorax. I have canceled his CT Chest. He does not need to follow up in our clinic unless he later develops dyspnea that limits his activities (which could potentially be manifestation of AS-related restriction). All questions addressed.

## 2020-12-18 NOTE — Telephone Encounter (Signed)
Pt came into clinic today and dropped of CXR Disk for Dr. Verlee Monte form was placed in his box. Pls regard; [phone]

## 2021-02-06 DIAGNOSIS — L814 Other melanin hyperpigmentation: Secondary | ICD-10-CM | POA: Diagnosis not present

## 2021-02-06 DIAGNOSIS — Z85828 Personal history of other malignant neoplasm of skin: Secondary | ICD-10-CM | POA: Diagnosis not present

## 2021-02-06 DIAGNOSIS — L821 Other seborrheic keratosis: Secondary | ICD-10-CM | POA: Diagnosis not present

## 2021-02-06 DIAGNOSIS — C44319 Basal cell carcinoma of skin of other parts of face: Secondary | ICD-10-CM | POA: Diagnosis not present

## 2021-02-06 DIAGNOSIS — L570 Actinic keratosis: Secondary | ICD-10-CM | POA: Diagnosis not present

## 2021-02-06 DIAGNOSIS — L578 Other skin changes due to chronic exposure to nonionizing radiation: Secondary | ICD-10-CM | POA: Diagnosis not present

## 2021-02-06 DIAGNOSIS — D492 Neoplasm of unspecified behavior of bone, soft tissue, and skin: Secondary | ICD-10-CM | POA: Diagnosis not present

## 2021-02-06 DIAGNOSIS — L820 Inflamed seborrheic keratosis: Secondary | ICD-10-CM | POA: Diagnosis not present

## 2021-02-11 DIAGNOSIS — H44111 Panuveitis, right eye: Secondary | ICD-10-CM | POA: Diagnosis not present

## 2021-02-11 DIAGNOSIS — Z961 Presence of intraocular lens: Secondary | ICD-10-CM | POA: Diagnosis not present

## 2021-02-11 DIAGNOSIS — H35711 Central serous chorioretinopathy, right eye: Secondary | ICD-10-CM | POA: Diagnosis not present

## 2021-02-11 DIAGNOSIS — M452 Ankylosing spondylitis of cervical region: Secondary | ICD-10-CM | POA: Diagnosis not present

## 2021-02-11 DIAGNOSIS — Z79899 Other long term (current) drug therapy: Secondary | ICD-10-CM | POA: Diagnosis not present

## 2021-02-12 ENCOUNTER — Ambulatory Visit: Payer: Medicare Other | Admitting: Internal Medicine

## 2021-02-12 DIAGNOSIS — H44111 Panuveitis, right eye: Secondary | ICD-10-CM | POA: Diagnosis not present

## 2021-02-12 DIAGNOSIS — Z79899 Other long term (current) drug therapy: Secondary | ICD-10-CM | POA: Diagnosis not present

## 2021-03-19 ENCOUNTER — Inpatient Hospital Stay: Payer: Medicare Other

## 2021-03-19 ENCOUNTER — Telehealth: Payer: Self-pay | Admitting: Hematology and Oncology

## 2021-03-19 ENCOUNTER — Inpatient Hospital Stay: Payer: Medicare Other | Admitting: Hematology and Oncology

## 2021-03-19 NOTE — Telephone Encounter (Signed)
Scheduled per sch msg. Called and spoke with patient. Confirmed appt  

## 2021-03-27 ENCOUNTER — Observation Stay (HOSPITAL_COMMUNITY): Payer: Medicare Other

## 2021-03-27 ENCOUNTER — Other Ambulatory Visit: Payer: Self-pay

## 2021-03-27 ENCOUNTER — Other Ambulatory Visit: Payer: Self-pay | Admitting: Hematology and Oncology

## 2021-03-27 ENCOUNTER — Emergency Department (HOSPITAL_COMMUNITY): Payer: Medicare Other

## 2021-03-27 ENCOUNTER — Inpatient Hospital Stay: Payer: Medicare Other | Attending: Hematology and Oncology | Admitting: Hematology and Oncology

## 2021-03-27 ENCOUNTER — Inpatient Hospital Stay (HOSPITAL_COMMUNITY)
Admission: EM | Admit: 2021-03-27 | Discharge: 2021-04-01 | DRG: 035 | Disposition: A | Discharge status: Home / Self Care | Payer: Medicare Other | Attending: Internal Medicine | Admitting: Internal Medicine

## 2021-03-27 ENCOUNTER — Inpatient Hospital Stay: Payer: Medicare Other

## 2021-03-27 ENCOUNTER — Encounter (HOSPITAL_COMMUNITY): Payer: Self-pay

## 2021-03-27 VITALS — BP 132/64 | HR 66 | Temp 98.1°F | Resp 17 | Wt 145.9 lb

## 2021-03-27 DIAGNOSIS — I6503 Occlusion and stenosis of bilateral vertebral arteries: Secondary | ICD-10-CM | POA: Diagnosis not present

## 2021-03-27 DIAGNOSIS — R29703 NIHSS score 3: Secondary | ICD-10-CM | POA: Diagnosis not present

## 2021-03-27 DIAGNOSIS — R531 Weakness: Secondary | ICD-10-CM | POA: Diagnosis not present

## 2021-03-27 DIAGNOSIS — E785 Hyperlipidemia, unspecified: Secondary | ICD-10-CM | POA: Diagnosis present

## 2021-03-27 DIAGNOSIS — Z87891 Personal history of nicotine dependence: Secondary | ICD-10-CM

## 2021-03-27 DIAGNOSIS — Z8739 Personal history of other diseases of the musculoskeletal system and connective tissue: Secondary | ICD-10-CM

## 2021-03-27 DIAGNOSIS — Z79631 Long term (current) use of antimetabolite agent: Secondary | ICD-10-CM

## 2021-03-27 DIAGNOSIS — R29700 NIHSS score 0: Secondary | ICD-10-CM | POA: Diagnosis present

## 2021-03-27 DIAGNOSIS — R4701 Aphasia: Secondary | ICD-10-CM | POA: Diagnosis not present

## 2021-03-27 DIAGNOSIS — R29702 NIHSS score 2: Secondary | ICD-10-CM | POA: Diagnosis not present

## 2021-03-27 DIAGNOSIS — K565 Intestinal adhesions [bands], unspecified as to partial versus complete obstruction: Secondary | ICD-10-CM

## 2021-03-27 DIAGNOSIS — D509 Iron deficiency anemia, unspecified: Secondary | ICD-10-CM | POA: Diagnosis not present

## 2021-03-27 DIAGNOSIS — I639 Cerebral infarction, unspecified: Secondary | ICD-10-CM

## 2021-03-27 DIAGNOSIS — I6522 Occlusion and stenosis of left carotid artery: Secondary | ICD-10-CM | POA: Diagnosis present

## 2021-03-27 DIAGNOSIS — Z006 Encounter for examination for normal comparison and control in clinical research program: Secondary | ICD-10-CM | POA: Diagnosis not present

## 2021-03-27 DIAGNOSIS — I6523 Occlusion and stenosis of bilateral carotid arteries: Secondary | ICD-10-CM | POA: Diagnosis not present

## 2021-03-27 DIAGNOSIS — I10 Essential (primary) hypertension: Secondary | ICD-10-CM | POA: Diagnosis present

## 2021-03-27 DIAGNOSIS — D649 Anemia, unspecified: Secondary | ICD-10-CM

## 2021-03-27 DIAGNOSIS — R4702 Dysphasia: Secondary | ICD-10-CM | POA: Diagnosis not present

## 2021-03-27 DIAGNOSIS — Z20822 Contact with and (suspected) exposure to covid-19: Secondary | ICD-10-CM | POA: Diagnosis present

## 2021-03-27 DIAGNOSIS — F32A Depression, unspecified: Secondary | ICD-10-CM | POA: Diagnosis present

## 2021-03-27 DIAGNOSIS — D539 Nutritional anemia, unspecified: Secondary | ICD-10-CM | POA: Diagnosis present

## 2021-03-27 DIAGNOSIS — I951 Orthostatic hypotension: Secondary | ICD-10-CM | POA: Diagnosis not present

## 2021-03-27 DIAGNOSIS — R479 Unspecified speech disturbances: Secondary | ICD-10-CM | POA: Diagnosis present

## 2021-03-27 DIAGNOSIS — Z961 Presence of intraocular lens: Secondary | ICD-10-CM | POA: Diagnosis not present

## 2021-03-27 DIAGNOSIS — E739 Lactose intolerance, unspecified: Secondary | ICD-10-CM | POA: Diagnosis not present

## 2021-03-27 DIAGNOSIS — Z79899 Other long term (current) drug therapy: Secondary | ICD-10-CM

## 2021-03-27 DIAGNOSIS — D62 Acute posthemorrhagic anemia: Secondary | ICD-10-CM | POA: Diagnosis not present

## 2021-03-27 DIAGNOSIS — Z8249 Family history of ischemic heart disease and other diseases of the circulatory system: Secondary | ICD-10-CM

## 2021-03-27 DIAGNOSIS — G319 Degenerative disease of nervous system, unspecified: Secondary | ICD-10-CM | POA: Diagnosis not present

## 2021-03-27 DIAGNOSIS — F419 Anxiety disorder, unspecified: Secondary | ICD-10-CM | POA: Diagnosis present

## 2021-03-27 DIAGNOSIS — R9431 Abnormal electrocardiogram [ECG] [EKG]: Secondary | ICD-10-CM | POA: Diagnosis not present

## 2021-03-27 DIAGNOSIS — Z7902 Long term (current) use of antithrombotics/antiplatelets: Secondary | ICD-10-CM | POA: Diagnosis not present

## 2021-03-27 DIAGNOSIS — M459 Ankylosing spondylitis of unspecified sites in spine: Secondary | ICD-10-CM | POA: Diagnosis not present

## 2021-03-27 DIAGNOSIS — I6389 Other cerebral infarction: Secondary | ICD-10-CM | POA: Diagnosis not present

## 2021-03-27 DIAGNOSIS — I63232 Cerebral infarction due to unspecified occlusion or stenosis of left carotid arteries: Secondary | ICD-10-CM | POA: Diagnosis not present

## 2021-03-27 DIAGNOSIS — D500 Iron deficiency anemia secondary to blood loss (chronic): Secondary | ICD-10-CM | POA: Diagnosis not present

## 2021-03-27 DIAGNOSIS — G459 Transient cerebral ischemic attack, unspecified: Secondary | ICD-10-CM

## 2021-03-27 DIAGNOSIS — R0902 Hypoxemia: Secondary | ICD-10-CM | POA: Diagnosis not present

## 2021-03-27 DIAGNOSIS — Z9841 Cataract extraction status, right eye: Secondary | ICD-10-CM | POA: Diagnosis not present

## 2021-03-27 DIAGNOSIS — R4781 Slurred speech: Secondary | ICD-10-CM | POA: Diagnosis not present

## 2021-03-27 DIAGNOSIS — Z886 Allergy status to analgesic agent status: Secondary | ICD-10-CM

## 2021-03-27 DIAGNOSIS — E782 Mixed hyperlipidemia: Secondary | ICD-10-CM | POA: Diagnosis present

## 2021-03-27 DIAGNOSIS — E876 Hypokalemia: Secondary | ICD-10-CM | POA: Diagnosis not present

## 2021-03-27 DIAGNOSIS — I672 Cerebral atherosclerosis: Secondary | ICD-10-CM | POA: Diagnosis not present

## 2021-03-27 DIAGNOSIS — E7800 Pure hypercholesterolemia, unspecified: Secondary | ICD-10-CM | POA: Diagnosis not present

## 2021-03-27 DIAGNOSIS — R29701 NIHSS score 1: Secondary | ICD-10-CM | POA: Diagnosis not present

## 2021-03-27 DIAGNOSIS — Z7982 Long term (current) use of aspirin: Secondary | ICD-10-CM | POA: Diagnosis not present

## 2021-03-27 DIAGNOSIS — I959 Hypotension, unspecified: Secondary | ICD-10-CM | POA: Diagnosis not present

## 2021-03-27 LAB — URINALYSIS, ROUTINE W REFLEX MICROSCOPIC
Bilirubin Urine: NEGATIVE
Glucose, UA: NEGATIVE mg/dL
Hgb urine dipstick: NEGATIVE
Ketones, ur: NEGATIVE mg/dL
Leukocytes,Ua: NEGATIVE
Nitrite: NEGATIVE
Protein, ur: NEGATIVE mg/dL
Specific Gravity, Urine: 1.015 (ref 1.005–1.030)
pH: 7.5 (ref 5.0–8.0)

## 2021-03-27 LAB — CBC WITH DIFFERENTIAL (CANCER CENTER ONLY)
Abs Immature Granulocytes: 0.03 10*3/uL (ref 0.00–0.07)
Basophils Absolute: 0 10*3/uL (ref 0.0–0.1)
Basophils Relative: 0 %
Eosinophils Absolute: 0.1 10*3/uL (ref 0.0–0.5)
Eosinophils Relative: 2 %
HCT: 33.8 % — ABNORMAL LOW (ref 39.0–52.0)
Hemoglobin: 11.3 g/dL — ABNORMAL LOW (ref 13.0–17.0)
Immature Granulocytes: 1 %
Lymphocytes Relative: 10 %
Lymphs Abs: 0.6 10*3/uL — ABNORMAL LOW (ref 0.7–4.0)
MCH: 32.9 pg (ref 26.0–34.0)
MCHC: 33.4 g/dL (ref 30.0–36.0)
MCV: 98.5 fL (ref 80.0–100.0)
Monocytes Absolute: 0.5 10*3/uL (ref 0.1–1.0)
Monocytes Relative: 8 %
Neutro Abs: 5.1 10*3/uL (ref 1.7–7.7)
Neutrophils Relative %: 79 %
Platelet Count: 234 10*3/uL (ref 150–400)
RBC: 3.43 MIL/uL — ABNORMAL LOW (ref 4.22–5.81)
RDW: 13.1 % (ref 11.5–15.5)
WBC Count: 6.5 10*3/uL (ref 4.0–10.5)
nRBC: 0 % (ref 0.0–0.2)

## 2021-03-27 LAB — DIFFERENTIAL
Abs Immature Granulocytes: 0.03 10*3/uL (ref 0.00–0.07)
Basophils Absolute: 0 10*3/uL (ref 0.0–0.1)
Basophils Relative: 0 %
Eosinophils Absolute: 0.1 10*3/uL (ref 0.0–0.5)
Eosinophils Relative: 2 %
Immature Granulocytes: 1 %
Lymphocytes Relative: 9 %
Lymphs Abs: 0.6 10*3/uL — ABNORMAL LOW (ref 0.7–4.0)
Monocytes Absolute: 0.6 10*3/uL (ref 0.1–1.0)
Monocytes Relative: 10 %
Neutro Abs: 5 10*3/uL (ref 1.7–7.7)
Neutrophils Relative %: 78 %

## 2021-03-27 LAB — CMP (CANCER CENTER ONLY)
ALT: 18 U/L (ref 0–44)
AST: 18 U/L (ref 15–41)
Albumin: 3.8 g/dL (ref 3.5–5.0)
Alkaline Phosphatase: 56 U/L (ref 38–126)
Anion gap: 8 (ref 5–15)
BUN: 13 mg/dL (ref 8–23)
CO2: 27 mmol/L (ref 22–32)
Calcium: 9 mg/dL (ref 8.9–10.3)
Chloride: 103 mmol/L (ref 98–111)
Creatinine: 0.77 mg/dL (ref 0.61–1.24)
GFR, Estimated: >60 mL/min (ref >60)
Glucose, Bld: 106 mg/dL — ABNORMAL HIGH (ref 70–99)
Potassium: 4 mmol/L (ref 3.5–5.1)
Sodium: 138 mmol/L (ref 135–145)
Total Bilirubin: 0.9 mg/dL (ref 0.3–1.2)
Total Protein: 7.4 g/dL (ref 6.5–8.1)

## 2021-03-27 LAB — RETIC PANEL
Immature Retic Fract: 15.4 % (ref 2.3–15.9)
RBC.: 3.43 MIL/uL — ABNORMAL LOW (ref 4.22–5.81)
Retic Count, Absolute: 31.9 10*3/uL (ref 19.0–186.0)
Retic Ct Pct: 0.9 % (ref 0.4–3.1)
Reticulocyte Hemoglobin: 38.8 pg (ref >27.9)

## 2021-03-27 LAB — CBG MONITORING, ED: Glucose-Capillary: 130 mg/dL — ABNORMAL HIGH (ref 70–99)

## 2021-03-27 LAB — COMPREHENSIVE METABOLIC PANEL
ALT: 18 U/L (ref 0–44)
AST: 20 U/L (ref 15–41)
Albumin: 3.8 g/dL (ref 3.5–5.0)
Alkaline Phosphatase: 48 U/L (ref 38–126)
Anion gap: 8 (ref 5–15)
BUN: 11 mg/dL (ref 8–23)
CO2: 27 mmol/L (ref 22–32)
Calcium: 9 mg/dL (ref 8.9–10.3)
Chloride: 101 mmol/L (ref 98–111)
Creatinine, Ser: 0.75 mg/dL (ref 0.61–1.24)
GFR, Estimated: >60 mL/min (ref >60)
Glucose, Bld: 126 mg/dL — ABNORMAL HIGH (ref 70–99)
Potassium: 3.9 mmol/L (ref 3.5–5.1)
Sodium: 136 mmol/L (ref 135–145)
Total Bilirubin: 0.6 mg/dL (ref 0.3–1.2)
Total Protein: 7.1 g/dL (ref 6.5–8.1)

## 2021-03-27 LAB — CBC
HCT: 35.3 % — ABNORMAL LOW (ref 39.0–52.0)
Hemoglobin: 11.8 g/dL — ABNORMAL LOW (ref 13.0–17.0)
MCH: 33.8 pg (ref 26.0–34.0)
MCHC: 33.4 g/dL (ref 30.0–36.0)
MCV: 101.1 fL — ABNORMAL HIGH (ref 80.0–100.0)
Platelets: 228 10*3/uL (ref 150–400)
RBC: 3.49 MIL/uL — ABNORMAL LOW (ref 4.22–5.81)
RDW: 13 % (ref 11.5–15.5)
WBC: 6.4 10*3/uL (ref 4.0–10.5)
nRBC: 0 % (ref 0.0–0.2)

## 2021-03-27 LAB — RAPID URINE DRUG SCREEN, HOSP PERFORMED
Amphetamines: NOT DETECTED
Barbiturates: NOT DETECTED
Benzodiazepines: NOT DETECTED
Cocaine: NOT DETECTED
Opiates: NOT DETECTED
Tetrahydrocannabinol: NOT DETECTED

## 2021-03-27 LAB — RESP PANEL BY RT-PCR (FLU A&B, COVID) ARPGX2
Influenza A by PCR: NEGATIVE
Influenza B by PCR: NEGATIVE
SARS Coronavirus 2 by RT PCR: NEGATIVE

## 2021-03-27 LAB — IRON AND TIBC
Iron: 109 ug/dL (ref 42–163)
Saturation Ratios: 38 % (ref 20–55)
TIBC: 283 ug/dL (ref 202–409)
UIBC: 174 ug/dL (ref 117–376)

## 2021-03-27 LAB — PROTIME-INR
INR: 1.1 (ref 0.8–1.2)
Prothrombin Time: 13.9 seconds (ref 11.4–15.2)

## 2021-03-27 LAB — APTT: aPTT: 29 seconds (ref 24–36)

## 2021-03-27 LAB — ETHANOL: Alcohol, Ethyl (B): <10 mg/dL (ref <10)

## 2021-03-27 LAB — FERRITIN: Ferritin: 197 ng/mL (ref 24–336)

## 2021-03-27 MED ORDER — ASPIRIN EC 81 MG PO TBEC
81.0000 mg | DELAYED_RELEASE_TABLET | Freq: Every day | ORAL | Status: DC
  Administered 2021-03-27: 81 mg via ORAL
  Filled 2021-03-27: qty 1

## 2021-03-27 MED ORDER — ACETAMINOPHEN 325 MG PO TABS
650.0000 mg | ORAL_TABLET | ORAL | Status: DC | PRN
  Administered 2021-03-28 – 2021-04-01 (×2): 650 mg via ORAL
  Filled 2021-03-27 (×2): qty 2

## 2021-03-27 MED ORDER — POLYETHYLENE GLYCOL 3350 17 G PO PACK
17.0000 g | PACK | Freq: Every day | ORAL | Status: DC | PRN
  Administered 2021-03-29: 22:00:00 17 g via ORAL
  Filled 2021-03-27: qty 1

## 2021-03-27 MED ORDER — STROKE: EARLY STAGES OF RECOVERY BOOK: Freq: Once | Status: AC

## 2021-03-27 MED ORDER — SODIUM CHLORIDE 0.9% FLUSH: 3.0000 mL | INTRAVENOUS | Status: DC | PRN

## 2021-03-27 MED ORDER — ROSUVASTATIN CALCIUM 5 MG PO TABS
10.0000 mg | ORAL_TABLET | Freq: Every day | ORAL | Status: DC
  Administered 2021-03-27 – 2021-03-31 (×5): 10 mg via ORAL
  Filled 2021-03-27 (×5): qty 2

## 2021-03-27 MED ORDER — SODIUM CHLORIDE 0.9% FLUSH
3.0000 mL | Freq: Two times a day (BID) | INTRAVENOUS | Status: DC
  Administered 2021-03-27 – 2021-04-01 (×9): 3 mL via INTRAVENOUS

## 2021-03-27 MED ORDER — IOHEXOL 350 MG/ML SOLN
75.0000 mL | Freq: Once | INTRAVENOUS | Status: AC | PRN
  Administered 2021-03-27: 75 mL via INTRAVENOUS

## 2021-03-27 MED ORDER — SODIUM CHLORIDE 0.9 % IV SOLN: 250.0000 mL | INTRAVENOUS | Status: DC | PRN

## 2021-03-27 MED ORDER — CITALOPRAM HYDROBROMIDE 20 MG PO TABS
20.0000 mg | ORAL_TABLET | Freq: Every day | ORAL | Status: DC
  Administered 2021-03-28 – 2021-04-01 (×4): 20 mg via ORAL
  Filled 2021-03-27 (×2): qty 2
  Filled 2021-03-27: qty 1
  Filled 2021-03-27: qty 2

## 2021-03-27 MED ORDER — ACETAMINOPHEN 160 MG/5ML PO SOLN: 650.0000 mg | ORAL | Status: DC | PRN

## 2021-03-27 MED ORDER — ACETAMINOPHEN 650 MG RE SUPP: 650.0000 mg | RECTAL | Status: DC | PRN

## 2021-03-27 NOTE — ED Notes (Signed)
Patient transported to MRI 

## 2021-03-27 NOTE — ED Triage Notes (Signed)
Pt was brought in by EMS from home, after having episodes of aphagia from noon at the restaurant and at [employer]. Pt states he had no visual changes but has been feeling weak. Symptoms resolved prior to ems arrival at scene. Wife called ems due to symptoms.

## 2021-03-27 NOTE — Progress Notes (Signed)
[place] [phone]   [phone]  PROGRESS NOTE  Patient Care Team: [name], MD as PCP - General (Internal Medicine)  Hematological/Oncological History #Normocytic Anemia #Iron Deficiency Anemia 1) 01/01/2017: WBC 5.1, Hgb 11.0, Plt 254, MCV 91.6 2) 12/07/2017: WBC 8.0, Hgb 11.9, Plt 352, MCV 91.4. Iron 76, TIBC 359, Sat 21%, Ferritin 31 3) 03/09/2019: WBC 14.3, Hgb 11.1, Plt 374, MCV 88.8 4) 05/22/2019: WBC 5.5, Hgb 10.7, Plt 311, MCV 83.7. Routine PCP visit. 5) 06/14/2019: establish care with Dr. Lorenso Courier. WBC 5.0, Hgb 10.3, MCV 84.9, Plt 320 6) 09/14/2019: WBC 6.2, Hgb 10.9, MCV 91.2, Plt 209 7)  11/01/2019: WBC 9.2, Hgb 12.6, MVC 97.2, Plt 299 8) 11/05/2019: patient admitted with SBO, instructed to stop PO iron indefinitely.  9) 02/28/2020: admitted with SBO, Hgb drop to 9.6 while inpatient 10) 05/17/2020: Hgb 11.7, increased without intervention.  11) 09/05/2020: WBC 6.6, Hgb 10.9, MCV 94.6, Plt 259  Interval History:  Steven [name] 86 y.o. male with medical history significant for iron deficiency anemia who presents for a follow up visit. The patient's last visit was on 09/05/2020. In the interim since the last visit Steven [name] has not had a hospital admission or any other major changes in health.   On exam today Steven [name] notes did have a COVID infection in the interim since our last visit.  He reports that it was quite mild and he did not have any major symptoms.  He notes this occurred in September 2022.  He notes since then his energy levels been "okay".  He has not had any issues with shortness of breath or chest pain.  He denies any bleeding, bruising, or dark stools.  He notes that he does continue to have chronic sinus issues but he is doing well overall.  He is also not having any systemic infections of fevers, chills, sweats, nausea, vomiting or diarrhea.  Otherwise a full 10 point ROS is listed below.   MEDICAL HISTORY:  Past Medical  History:  Diagnosis Date   Allergy    enviromental   Anemia 2017   Anxiety    Arthritis    ankylosing spodilitis   Blood transfusion without reported diagnosis    during surgery   Cataract    Depression    Dyspnea    with exertion - had Echo done 09/29/16   History of kidney stones    Hyperlipidemia    Intestinal obstruction ([place])    Pneumonia    as a child    SURGICAL HISTORY: Past Surgical History:  Procedure Laterality Date   ABDOMINAL SURGERY     had abcess   APPENDECTOMY     CHOLECYSTECTOMY     with lysis of adhesions   COLONOSCOPY     COLOSTOMY     COLOSTOMY REVERSAL     EYE SURGERY Left    scar tissue removed from cornea   EYE SURGERY Right    cataract surgery with lens implant   JOINT REPLACEMENT Right    hip  x 2 1999 and 2007   SHOULDER ARTHROSCOPY WITH ROTATOR CUFF REPAIR AND SUBACROMIAL DECOMPRESSION Left 03/02/2013   Procedure: LEFT SHOULDER ARTHROSCOPY WITH SUBACROMIAL DECOMPRESSION DISTAL CALVICLE RESECTION AND ROTATOR CUFF REPAIR ;  Surgeon: [name], MD;  Location: [facility];  Service: Orthopedics;  Laterality: Left;   TOTAL HIP REVISION Right 11/06/2016   Procedure: TOTAL HIP REVISION OF THE ACETABULAR COMPONENT;  Surgeon: [name], MD;  Location: [facility];  Service: Orthopedics;  Laterality: Right;    SOCIAL HISTORY: Social History   Socioeconomic History   Marital status: Married    Spouse name: Not on file   Number of children: 2   Years of education: Not on file   Highest education level: Not on file  Occupational History   Occupation: retired  Tobacco Use   Smoking status: Former   Smokeless tobacco: Never  Scientific laboratory technician Use: Never used  Substance and Sexual Activity   Alcohol use: Yes    Comment: 3 beers or glasses of wine a day--reports stopped ETOH 11/01/16    Drug use: No   Sexual activity: Never  Other Topics Concern   Not on file  Social History Narrative   Not on file   Social Determinants of Health    Financial Resource Strain: Not on file  Food Insecurity: Not on file  Transportation Needs: Not on file  Physical Activity: Not on file  Stress: Not on file  Social Connections: Not on file  Intimate Partner Violence: Not on file    FAMILY HISTORY: Family History  Problem Relation Age of Onset   Heart attack Mother    Diabetes Mother    Breast cancer Mother    Heart attack Maternal Grandfather    Pancreatic cancer Brother    Colon cancer Neg Hx    Esophageal cancer Neg Hx    Stomach cancer Neg Hx     ALLERGIES:  is allergic to other, indomethacin, and lactose intolerance (gi).  MEDICATIONS:  No current facility-administered medications for this visit.   No current outpatient medications on file.   Facility-Administered Medications Ordered in Other Visits  Medication Dose Route Frequency Provider Last Rate Last Admin   0.9 %  sodium chloride infusion  250 mL Intravenous PRN Chotiner, Yevonne Aline, MD       acetaminophen (TYLENOL) tablet 650 mg  650 mg Oral Q4H PRN Chotiner, Yevonne Aline, MD   650 mg at 03/28/21 0205   Or   acetaminophen (TYLENOL) 160 MG/5ML solution 650 mg  650 mg Per Tube Q4H PRN Chotiner, Yevonne Aline, MD       Or   acetaminophen (TYLENOL) suppository 650 mg  650 mg Rectal Q4H PRN Chotiner, Yevonne Aline, MD       aspirin EC tablet 325 mg  325 mg Oral Daily Rosalin Hawking, MD   325 mg at 03/30/21 1040   [START ON 03/31/2021] cefUROXime (ZINACEF) 1.5 g in sodium chloride 0.9 % 100 mL IVPB  1.5 g Intravenous On Call to OR Laurence Slate M, PA-C       citalopram (CELEXA) tablet 20 mg  20 mg Oral Daily Chotiner, Yevonne Aline, MD   20 mg at 03/30/21 1040   clopidogrel (PLAVIX) tablet 75 mg  75 mg Oral Daily Rosalin Hawking, MD   75 mg at 03/30/21 1040   midodrine (PROAMATINE) tablet 10 mg  10 mg Oral TID WC Hongalgi, Anand D, MD   10 mg at 03/30/21 1704   polyethylene glycol (MIRALAX / GLYCOLAX) packet 17 g  17 g Oral Daily PRN Chotiner, Yevonne Aline, MD   17 g at 03/29/21 2154    rosuvastatin (CRESTOR) tablet 10 mg  10 mg Oral QHS Chotiner, Yevonne Aline, MD   10 mg at 03/29/21 2154   sodium chloride flush (NS) 0.9 % injection 3 mL  3 mL Intravenous Q12H Chotiner, Yevonne Aline, MD   3 mL at 03/30/21 0900   sodium  chloride flush (NS) 0.9 % injection 3 mL  3 mL Intravenous PRN Chotiner, Yevonne Aline, MD        REVIEW OF SYSTEMS:   Constitutional: ( - ) fevers, ( - )  chills , ( - ) night sweats Eyes: ( - ) blurriness of vision, ( - ) double vision, ( - ) watery eyes Ears, nose, mouth, throat, and face: ( - ) mucositis, ( - ) sore throat Respiratory: ( - ) cough, ( - ) dyspnea, ( - ) wheezes Cardiovascular: ( - ) palpitation, ( - ) chest discomfort, ( - ) lower extremity swelling Gastrointestinal:  ( - ) nausea, ( - ) heartburn, ( - ) change in bowel habits Skin: ( - ) abnormal skin rashes Lymphatics: ( - ) new lymphadenopathy, ( - ) easy bruising Neurological: ( - ) numbness, ( - ) tingling, ( - ) new weaknesses Behavioral/Psych: ( - ) mood change, ( - ) new changes  All other systems were reviewed with the patient and are negative.  PHYSICAL EXAMINATION: ECOG PERFORMANCE STATUS: 1 - Symptomatic but completely ambulatory  Vitals:   03/27/21 1258  BP: 132/64  Pulse: 66  Resp: 17  Temp: 98.1 F (36.7 C)  SpO2: 98%   Filed Weights   03/27/21 1258  Weight: 145 lb 14.4 oz (66.2 kg)    GENERAL: well appearing elderly Caucasian male in NAD  SKIN: skin color, texture, turgor are normal, no rashes or significant lesions EYES: conjunctiva are pink and non-injected, sclera clear LUNGS: clear to auscultation and percussion with normal breathing effort HEART: regular rate & rhythm and no murmurs and no lower extremity edema ABDOMEN: soft, non-tender, non-distended, normal bowel sounds. No HSM appreciated. Musculoskeletal: no cyanosis of digits and no clubbing  PSYCH: alert & oriented x 3, fluent speech NEURO: no focal motor/sensory deficits  LABORATORY DATA:  I have  reviewed the data as listed CBC Latest Ref Rng & Units 03/27/2021 03/27/2021 10/25/2020  WBC 4.0 - 10.5 K/uL 6.4 6.5 5.2  Hemoglobin 13.0 - 17.0 g/dL 11.8(L) 11.3(L) 12.0(L)  Hematocrit 39.0 - 52.0 % 35.3(L) 33.8(L) 35.5(L)  Platelets 150 - 400 K/uL 228 234 221    CMP Latest Ref Rng & Units 03/27/2021 03/27/2021 10/25/2020  Glucose 70 - 99 mg/dL 126(H) 106(H) 153(H)  BUN 8 - 23 mg/dL 11 13 17   Creatinine 0.61 - 1.24 mg/dL 0.75 0.77 0.79  Sodium 135 - 145 mmol/L 136 138 137  Potassium 3.5 - 5.1 mmol/L 3.9 4.0 4.3  Chloride 98 - 111 mmol/L 101 103 102  CO2 22 - 32 mmol/L 27 27 29   Calcium 8.9 - 10.3 mg/dL 9.0 9.0 9.4  Total Protein 6.5 - 8.1 g/dL 7.1 7.4 7.4  Total Bilirubin 0.3 - 1.2 mg/dL 0.6 0.9 0.4  Alkaline Phos 38 - 126 U/L 48 56 52  AST 15 - 41 U/L 20 18 16   ALT 0 - 44 U/L 18 18 16     No results found for: MPROTEIN Lab Results  Component Value Date   KPAFRELGTCHN 16.5 06/14/2019   LAMBDASER 14.3 06/14/2019   KAPLAMBRATIO 1.15 06/14/2019    RADIOGRAPHIC STUDIES: CT ANGIO HEAD NECK W WO CM  Result Date: 03/27/2021 CLINICAL DATA:  Stroke suspected, aphasia, slurred speech EXAM: CT ANGIOGRAPHY HEAD AND NECK TECHNIQUE: Multidetector CT imaging of the head and neck was performed using the standard protocol during bolus administration of intravenous contrast. Multiplanar CT image reconstructions and MIPs were obtained to evaluate the vascular anatomy. Carotid  stenosis measurements (when applicable) are obtained utilizing NASCET criteria, using the distal internal carotid diameter as the denominator. CONTRAST:  4mL OMNIPAQUE IOHEXOL 350 MG/ML SOLN COMPARISON:  CT head 03/27/2021.  No prior CTA. FINDINGS: CT HEAD FINDINGS For noncontrast findings, please see same day CT head. CTA NECK FINDINGS Aortic arch: Standard branching. Imaged portion shows no evidence of aneurysm or dissection. No significant stenosis of the major arch vessel origins. Aortic atherosclerosis. Right carotid  system: No evidence of dissection, stenosis (50% or greater) or occlusion. Calcifications in the bifurcation and in the proximal right ICA, which are not hemodynamically significant. Left carotid system: Approximately 70% stenosis of the proximal left ICA, secondary to calcified and noncalcified plaque (series 6, image 174). No evidence of dissection. Vertebral arteries: Mild narrowing of the origin of the right vertebral artery, secondary to calcified and noncalcified plaque. The right vertebral artery is otherwise patent and dominant. The left vertebral artery is patent from its origin to the vertebrobasilar junction. No evidence of dissection. Skeleton: Complete osseous fusion of the cervical spine, consistent with history of ankylosing spondylitis. No acute osseous abnormality. Other neck: Negative. Upper chest: Negative. Review of the MIP images confirms the above findings CTA HEAD FINDINGS Anterior circulation: Both internal carotid arteries are patent to the termini, without stenosis or other abnormality. Normal right A1. Hypoplastic or stenotic left A1. Normal anterior communicating artery. Anterior cerebral arteries are patent to their distal aspects. No M1 stenosis or occlusion. Normal MCA bifurcations. Distal MCA branches perfused and symmetric. Posterior circulation: Vertebral arteries patent to the vertebrobasilar junction without stenosis. Posterior inferior cerebral arteries patent bilaterally. Basilar patent to its distal aspect but mildly irregular. Superior cerebellar arteries patent bilaterally. PCAs perfused to their distal aspects without stenosis. Fetal origin of the right PCA, with a patent right posterior communicating artery. The left posterior communicating artery is not visualized. Venous sinuses: As permitted by contrast timing, patent. Anatomic variants: None significant Review of the MIP images confirms the above findings IMPRESSION: 1. Approximately 70% stenosis of the proximal left  ICA. No other hemodynamically significant stenosis in the neck. 2. No intracranial large vessel occlusion or significant stenosis. Electronically Signed   By: Merilyn Baba M.D.   On: 03/27/2021 23:54   CT HEAD WO CONTRAST (5MM)  Result Date: 03/27/2021 CLINICAL DATA:  Slurred speech, generalized weakness EXAM: CT HEAD WITHOUT CONTRAST TECHNIQUE: Contiguous axial images were obtained from the base of the skull through the vertex without intravenous contrast. COMPARISON:  None. FINDINGS: Brain: No evidence of acute infarction, hemorrhage, hydrocephalus, extra-axial collection or mass lesion/mass effect. Global cortical atrophy. Subcortical white matter and periventricular small vessel ischemic changes. Vascular: No hyperdense vessel or unexpected calcification. Skull: Normal. Negative for fracture or focal lesion. Sinuses/Orbits: The visualized paranasal sinuses are essentially clear. The mastoid air cells are unopacified. Other: None. IMPRESSION: No evidence of acute intracranial abnormality. Atrophy with small vessel ischemic changes. Electronically Signed   By: Julian Hy M.D.   On: 03/27/2021 19:48   MR BRAIN WO CONTRAST  Result Date: 03/28/2021 CLINICAL DATA:  Transient ischemic attack, difficulty speaking EXAM: MRI HEAD WITHOUT CONTRAST TECHNIQUE: Multiplanar, multiecho pulse sequences of the brain and surrounding structures were obtained without intravenous contrast. COMPARISON:  No prior MRI, correlation is made with CT head and CTA head and neck FINDINGS: Evaluation is somewhat limited by motion artifact. Brain: Punctate focus of mild restricted diffusion in the posterior left frontal lobe (series 13, image 94), which may represent an acute or subacute infarct.  No acute hemorrhage, mass, mass effect, or midline shift. T2 hyperintense signal in the periventricular white matter, likely the sequela of chronic small vessel ischemic disease. Global cerebral atrophy, not unexpected for age, which  is slightly more pronounced in the parietal lobes bilaterally. No foci of hemosiderin deposition to suggest remote hemorrhage. Vascular: Normal flow voids. Skull and upper cervical spine: Osseous fusion of C1, C2, and C3, consistent with the patient's history of ankylosing spondylitis. Otherwise normal marrow signal. Sinuses/Orbits: Negative.  Status post bilateral lens replacements. Other: None. IMPRESSION: Evaluation is somewhat limited by motion artifact. Within this limitation, there is a punctate focus of restricted diffusion in the posterior left frontal lobe, which may represent acute or subacute infarct. Electronically Signed   By: Merilyn Baba M.D.   On: 03/28/2021 01:08   ECHOCARDIOGRAM COMPLETE  Result Date: 03/28/2021    ECHOCARDIOGRAM REPORT   Patient Name:   SEPHIROTH MCLUCKIE Enzor Date of Exam: 03/28/2021 Medical Rec #:  [id number]     Height:       68.0 in Accession #:    [id number]    Weight:       145.0 lb Date of Birth:  Jul 29, 1934     BSA:          1.783 m Patient Age:    89 years      BP:           109/53 mmHg Patient Gender: M             HR:           65 bpm. Exam Location:  Inpatient Procedure: 2D Echo, Color Doppler and Cardiac Doppler Indications:    TIA  History:        Patient has prior history of Echocardiogram examinations, most                 recent 09/29/2016. Signs/Symptoms:Dyspnea; Risk                 Factors:Dyslipidemia.  Sonographer:    Bernadene Person RDCS Referring Phys: [id number] [place]  1. Left ventricular ejection fraction, by estimation, is 60 to 65%. The left ventricle has normal function. The left ventricle has no regional wall motion abnormalities. Left ventricular diastolic parameters are indeterminate.  2. Right ventricular systolic function is normal. The right ventricular size is normal. There is normal pulmonary artery systolic pressure.  3. Left atrial size was mildly dilated.  4. Thickening of the mitral vavle c/f degenerative dx. No evidence of  mitral valve regurgitation.  5. Focal calcification. Aortic valve regurgitation is trivial. No aortic stenosis is present.  6. The inferior vena cava is normal in size with greater than 50% respiratory variability, suggesting right atrial pressure of 3 mmHg. Conclusion(s)/Recommendation(s): Compared to prior echo 09/29/2016 no significant changes. FINDINGS  Left Ventricle: Left ventricular ejection fraction, by estimation, is 60 to 65%. The left ventricle has normal function. The left ventricle has no regional wall motion abnormalities. The left ventricular internal cavity size was normal in size. There is  no left ventricular hypertrophy. Left ventricular diastolic parameters are indeterminate. Right Ventricle: The right ventricular size is normal. No increase in right ventricular wall thickness. Right ventricular systolic function is normal. There is normal pulmonary artery systolic pressure. The tricuspid regurgitant velocity is 2.16 m/s, and  with an assumed right atrial pressure of 3 mmHg, the estimated right ventricular systolic pressure is 86.7 mmHg. Left Atrium: Left atrial size was mildly dilated. Right  Atrium: Right atrial size was normal in size. Prominent Eustachian valve. Pericardium: There is no evidence of pericardial effusion. Mitral Valve: Thickening of the mitral vavle c/f degenerative dx. There is mild calcification of the mitral valve leaflet(s). No evidence of mitral valve regurgitation. Tricuspid Valve: The tricuspid valve is grossly normal. Tricuspid valve regurgitation is mild. Aortic Valve: Focal calcification. Aortic valve regurgitation is trivial. No aortic stenosis is present. Pulmonic Valve: The pulmonic valve was not well visualized. Pulmonic valve regurgitation is not visualized. Aorta: The aortic root and ascending aorta are structurally normal, with no evidence of dilitation. Venous: The inferior vena cava is normal in size with greater than 50% respiratory variability, suggesting  right atrial pressure of 3 mmHg. IAS/Shunts: The atrial septum is grossly normal.  LEFT VENTRICLE PLAX 2D LVIDd:         4.70 cm      Diastology LVIDs:         2.40 cm      LV e' medial:    5.51 cm/s LV PW:         0.80 cm      LV E/e' medial:  16.6 LV IVS:        0.90 cm      LV e' lateral:   7.38 cm/s LVOT diam:     2.10 cm      LV E/e' lateral: 12.4 LV SV:         71 LV SV Index:   40 LVOT Area:     3.46 cm  LV Volumes (MOD) LV vol d, MOD A2C: 93.0 ml LV vol d, MOD A4C: 114.0 ml LV vol s, MOD A2C: 37.5 ml LV vol s, MOD A4C: 53.7 ml LV SV MOD A2C:     55.5 ml LV SV MOD A4C:     114.0 ml LV SV MOD BP:      57.8 ml RIGHT VENTRICLE RV S prime:     13.10 cm/s TAPSE (M-mode): 2.1 cm LEFT ATRIUM             Index        RIGHT ATRIUM           Index LA diam:        3.10 cm 1.74 cm/m   RA Area:     13.40 cm LA Vol (A2C):   50.3 ml 28.22 ml/m  RA Volume:   33.20 ml  18.62 ml/m LA Vol (A4C):   42.4 ml 23.78 ml/m LA Biplane Vol: 48.4 ml 27.15 ml/m  AORTIC VALVE LVOT Vmax:   106.00 cm/s LVOT Vmean:  65.100 cm/s LVOT VTI:    0.205 m  AORTA Ao Root diam: 3.60 cm Ao Asc diam:  3.80 cm MITRAL VALVE               TRICUSPID VALVE MV Area (PHT): 3.17 cm    TR Peak grad:   18.7 mmHg MV Decel Time: 239 msec    TR Vmax:        216.00 cm/s MV E velocity: 91.60 cm/s MV A velocity: 81.80 cm/s  SHUNTS MV E/A ratio:  1.12        Systemic VTI:  0.20 m                            Systemic Diam: 2.10 cm Phineas Inches Electronically signed by Phineas Inches Signature Date/Time: 03/28/2021/5:49:12 PM    Final  [place]wy[place] L Bucks 86 y.o. male with medical history significant for iron deficiency anemia who presents for a follow up visit.  After review of the labs and discussion with the patient the findings are most consistent with continued iron deficiency anemia.  On exam today Steven [name] notes that he has been at his baseline level of health..  His hemoglobin levels have improved from his last visit and subsequent  hospitalizations without intervention. He has not noticed any overt signs of bleeding.  He had to stop his iron pills, but does still eat meat predominantly in the form of pork and chicken.  Otherwise he is virtually asymptomatic at this time.  GI evaluations: EGD 03/11/2017: normal stomach and esophagus. Mild duodenal stenosis Colonoscopy 11/17/2013: moderate diverticulosis  #Normocytic Anemia -- Today hemoglobin stable at 11.8, MCV 101.1, with white blood cell count of 6.4 and platelets of 228 --patient has to stop his ferrous sulfate 325mg  PO daily due to recurrent SBOs, as recommended by his surgical team.  --repeat Iron panel, ferritin, and reticulocyte panel today.  --will recommend IV iron to the patient if iron levels remain low  --may need to consider further GI evaluation if Hgb persistently declines. Steven [name] previously had a positive FOB card --RTC in 6 months or sooner if IV iron is required .   No orders of the defined types were placed in this encounter.    All questions were answered. The patient knows to call the clinic with any problems, questions or concerns.  A total of more than 30 minutes were spent on this encounter and over half of that time was spent on counseling and coordination of care as outlined above.   Ledell Peoples, MD Department of Hematology/Oncology [place] [facility] Phone: [phone] Pager: [phone] Email: [email or web address]  03/30/2021 5:23 PM

## 2021-03-27 NOTE — ED Notes (Signed)
Pt transported to CT ?

## 2021-03-27 NOTE — H&P (Signed)
History and Physical    Steven [name] PNT:[id number] DOB: March 01, 1935 DOA: 03/27/2021  PCP: [name], MD   Patient coming from: Home  Chief Complaint: speech difficulty  HPI: Steven [name] is a 86 y.o. male with medical history significant for HLD, ankylosing spodilitis, hx of SBO due to adhesions who presents for evaluation for possible stroke.  He reports that he woke up earlier than usual this morning and went to work.  He then had a follow-up appointment with his hematologist and was feeling fine at that time.  At lunchtime he went to get a sandwich and while he was there eating by himself he noticed he had difficulty speaking.  He also felt "wobbly and off" at that time.  When he went to check out he was not able to speak and his words were only gibberish and cannot be understood.  He was able to pay and then went out to his car.  He states he felt like something was off but he needed to go to [place] so he went there and while there he had difficulty speaking still.  He tried to ask for systems and what he was looking for and he was only able to speak in incomprehensible gibberish she states.  He then went home and after his wife got home he talked to her and continue to have gibberish speech and so decided he would lay down for a nap but his wife was concerned and called EMS.  He has never had this type of episode before.  Time EMS arrived the symptoms were improved.  He reports that the symptoms lasted approximately an hour and having difficulty speaking but is now back at his baseline level.  He has not had any fever, syncope, head injury, chest pain, palpitations, shortness of breath, nausea vomiting diarrhea, urinary symptoms.  ED Course: [name] has been hemodynamically stable in the emergency room.  CT of the head showed no acute intracranial pathology but showed chronic changes of small vessel disease.  Lab work was unremarkable.  He was able to speak normally and can provide history.   Hospitalist service was asked to observe patient overnight for further TIA work-up  Review of Systems:  General: Denies fever, chills, weight loss, night sweats.  Denies dizziness.  Denies change in appetite HENT: Denies head trauma, headache, denies change in hearing, tinnitus.  Denies nasal congestion or bleeding.  Denies sore throat, Denies difficulty swallowing Eyes: Denies blurry vision, pain in eye, drainage.  Denies discoloration of eyes. Neck: Denies pain.  Denies swelling.  Denies pain with movement. Cardiovascular: Denies chest pain, palpitations.  Denies edema.  Denies orthopnea Respiratory: Denies shortness of breath, cough.  Denies wheezing.  Denies sputum production Gastrointestinal: Denies abdominal pain, swelling.  Denies nausea, vomiting, diarrhea.  Denies melena.  Denies hematemesis. Musculoskeletal: Denies limitation of movement.  Denies deformity or swelling. Denies arthralgias or myalgias. Genitourinary: Denies pelvic pain.  Denies urinary frequency or hesitancy.  Denies dysuria.  Skin: Denies rash.  Denies petechiae, purpura, ecchymosis. Neurological:Denies syncope.  Denies seizure activity. Denies paresthesia. Denies drooping face.  Denies visual change. Psychiatric: Denies depression, anxiety. Denies hallucinations.  Past Medical History:  Diagnosis Date   Allergy    enviromental   Anemia 2017   Anxiety    Arthritis    ankylosing spodilitis   Blood transfusion without reported diagnosis    during surgery   Cataract    Depression    Dyspnea    with exertion -  had Echo done 09/29/16   History of kidney stones    Hyperlipidemia    Intestinal obstruction ([place])    Pneumonia    as a child    Past Surgical History:  Procedure Laterality Date   ABDOMINAL SURGERY     had abcess   APPENDECTOMY     CHOLECYSTECTOMY     with lysis of adhesions   COLONOSCOPY     COLOSTOMY     COLOSTOMY REVERSAL     EYE SURGERY Left    scar tissue removed from cornea   EYE  SURGERY Right    cataract surgery with lens implant   JOINT REPLACEMENT Right    hip  x 2 1999 and 2007   SHOULDER ARTHROSCOPY WITH ROTATOR CUFF REPAIR AND SUBACROMIAL DECOMPRESSION Left 03/02/2013   Procedure: LEFT SHOULDER ARTHROSCOPY WITH SUBACROMIAL DECOMPRESSION DISTAL CALVICLE RESECTION AND ROTATOR CUFF REPAIR ;  Surgeon: [name], MD;  Location: [facility];  Service: Orthopedics;  Laterality: Left;   TOTAL HIP REVISION Right 11/06/2016   Procedure: TOTAL HIP REVISION OF THE ACETABULAR COMPONENT;  Surgeon: [name], MD;  Location: [facility];  Service: Orthopedics;  Laterality: Right;    Social History  reports that he has quit smoking. He has never used smokeless tobacco. He reports current alcohol use. He reports that he does not use drugs.  Allergies  Allergen Reactions   Other Hives   Indomethacin Hives and Other (See Comments)   Lactose Intolerance (Gi)     GI UPSET    Family History  Problem Relation Age of Onset   Heart attack Mother    Diabetes Mother    Breast cancer Mother    Heart attack Maternal Grandfather    Pancreatic cancer Brother    Colon cancer Neg Hx    Esophageal cancer Neg Hx    Stomach cancer Neg Hx      Prior to Admission medications   Medication Sig Start Date End Date Taking? Authorizing Provider  acetaminophen (TYLENOL) 650 MG CR tablet Take 650 mg by mouth at bedtime.   Yes [provider]  Artificial Tear Solution (SOOTHE XP) SOLN Place 1 drop into both eyes 2 (two) times daily.   Yes [provider]  B Complex-C (B-COMPLEX WITH VITAMIN C) tablet Take 1 tablet by mouth daily.    Yes [provider]  Biotin 1 MG CAPS Take 1 capsule by mouth daily.    Yes [provider]  Calcium Carbonate-Vitamin D 600-400 MG-UNIT tablet Take 1 tablet by mouth 2 (two) times daily.    Yes [provider]  cholecalciferol (VITAMIN D) 1000 units tablet Take 1,000 Units by mouth daily.   Yes [provider]   citalopram (CELEXA) 20 MG tablet Take 20 mg by mouth daily.    Yes [provider]  folic acid (FOLVITE) 1 MG tablet Take 1 mg by mouth in the morning and at bedtime.   Yes [provider]  ipratropium (ATROVENT) 0.06 % nasal spray Place 2 sprays into both nostrils 2 (two) times daily as needed (allergies). 06/05/19  Yes [provider]  methotrexate (RHEUMATREX) 2.5 MG tablet Take 20 mg by mouth once a week. TAKE 8 TABLETS (20 MG) BY MOUTH ONCE A WEEK.   Yes [provider]  Multiple Vitamins-Minerals (PRESERVISION AREDS 2 PO) Take 1 tablet by mouth in the morning and at bedtime.   Yes [provider]  rosuvastatin (CRESTOR) 10 MG tablet Take 10 mg by  mouth at bedtime.  08/16/19  Yes [provider]  polyethylene glycol (MIRALAX / GLYCOLAX) 17 g packet Take 17 g by mouth daily as needed for mild constipation or moderate constipation (alaso available OTC). 03/02/20   Mendel Corning, MD    Physical Exam: Vitals:   03/27/21 1949 03/27/21 2000 03/27/21 2030 03/27/21 2100  BP: (!) 149/78 (!) 156/104 (!) 158/76 (!) 142/83  Pulse: 79 72 79 72  Resp: 18 (!) 25 20 19   Temp:      TempSrc:      SpO2: 100% 100% 98% 98%  Weight:      Height:        Constitutional: NAD, calm, comfortable Vitals:   03/27/21 1949 03/27/21 2000 03/27/21 2030 03/27/21 2100  BP: (!) 149/78 (!) 156/104 (!) 158/76 (!) 142/83  Pulse: 79 72 79 72  Resp: 18 (!) 25 20 19   Temp:      TempSrc:      SpO2: 100% 100% 98% 98%  Weight:      Height:       General: WDWN, Alert and oriented x3.  Eyes: EOMI, PERRL, conjunctivae normal.  Sclera nonicteric. No nystagmus HENT:  [place]/AT, external ears normal. Hearing aids in place. Nares patent without epistasis.  Mucous membranes are moist. Posterior pharynx clear of any exudate Neck: Soft, normal range of motion, supple, no masses, no thyromegaly.  Trachea midline Respiratory: clear to auscultation bilaterally, no wheezing, no  crackles. Normal respiratory effort. No accessory muscle use.  Cardiovascular: Regular rate and rhythm, no murmurs / rubs / gallops. No extremity edema. 2+ pedal pulses. No carotid bruits.  Abdomen: Soft, no tenderness, nondistended, no rebound or guarding.  No masses palpated. Bowel sounds normoactive Musculoskeletal: FROM. nocyanosis. No joint deformity upper and lower extremities. Normal muscle tone.  Skin: Warm, dry, intact no rashes, lesions, ulcers. No induration Neurologic: CN 2-12 grossly intact.  Normal speech.  Sensation intact, patella DTR +1 bilaterally. Strength 5/5 in all extremities. No tremor. No pronator drift. SLR negative Psychiatric: Normal judgment and insight.  Normal mood.   ABCD score moderate at 5.   Labs on Admission: I have personally reviewed following labs and imaging studies  CBC: Recent Labs  Lab 03/27/21 1248 03/27/21 1909  WBC 6.5 6.4  NEUTROABS 5.1 5.0  HGB 11.3* 11.8*  HCT 33.8* 35.3*  MCV 98.5 101.1*  PLT 234 448    Basic Metabolic Panel: Recent Labs  Lab 03/27/21 1248 03/27/21 1909  NA 138 136  K 4.0 3.9  CL 103 101  CO2 27 27  GLUCOSE 106* 126*  BUN 13 11  CREATININE 0.77 0.75  CALCIUM 9.0 9.0    GFR: Estimated Creatinine Clearance: 64 mL/min (by C-G formula based on SCr of 0.75 mg/dL).  Liver Function Tests: Recent Labs  Lab 03/27/21 1248 03/27/21 1909  AST 18 20  ALT 18 18  ALKPHOS 56 48  BILITOT 0.9 0.6  PROT 7.4 7.1  ALBUMIN 3.8 3.8    Urine analysis:    Component Value Date/Time   COLORURINE YELLOW 03/27/2021 1852   APPEARANCEUR CLEAR 03/27/2021 1852   APPEARANCEUR Hazy 04/18/2012 2324   LABSPEC 1.015 03/27/2021 1852   LABSPEC 1.018 04/18/2012 2324   PHURINE 7.5 03/27/2021 1852   GLUCOSEU NEGATIVE 03/27/2021 1852   GLUCOSEU Negative 04/18/2012 2324   HGBUR NEGATIVE 03/27/2021 1852   BILIRUBINUR NEGATIVE 03/27/2021 1852   BILIRUBINUR Negative 04/18/2012 2324   KETONESUR NEGATIVE 03/27/2021 [place] NEGATIVE 03/27/2021 1852  UROBILINOGEN 0.2 04/09/2014 2138   NITRITE NEGATIVE 03/27/2021 1852   LEUKOCYTESUR NEGATIVE 03/27/2021 1852   LEUKOCYTESUR Negative 04/18/2012 2324    Radiological Exams on Admission: CT HEAD WO CONTRAST (5MM)  Result Date: 03/27/2021 CLINICAL DATA:  Slurred speech, generalized weakness EXAM: CT HEAD WITHOUT CONTRAST TECHNIQUE: Contiguous axial images were obtained from the base of the skull through the vertex without intravenous contrast. COMPARISON:  None. FINDINGS: Brain: No evidence of acute infarction, hemorrhage, hydrocephalus, extra-axial collection or mass lesion/mass effect. Global cortical atrophy. Subcortical white matter and periventricular small vessel ischemic changes. Vascular: No hyperdense vessel or unexpected calcification. Skull: Normal. Negative for fracture or focal lesion. Sinuses/Orbits: The visualized paranasal sinuses are essentially clear. The mastoid air cells are unopacified. Other: None. IMPRESSION: No evidence of acute intracranial abnormality. Atrophy with small vessel ischemic changes. Electronically Signed   By: Julian Hy M.D.   On: 03/27/2021 19:48    EKG: Independently reviewed.  EKG shows normal sinus rhythm with right bundle branch block.  No acute ST elevation or depression.  QTc 445  Assessment/Plan Principal Problem:   TIA (transient ischemic attack) Patient be observed on medical telemetry floor for TIA/CVA.  Will obtain MRI of brain to rule out acute ischemic CVA.  Will evaluate carotids and intracranial vasculature for large vessel occlusion/stenosis. Obtain echocardiogram to evaluate for PFO, wall motion and ejection fraction. Hypertension of 220/110 will be allowed for 24 hours per stroke protocol.  After which blood pressure will be slowly reduced to goal level. Antiplatelet therapy with aspirin daily. Continue statin therapy.  Check lipid panel.  Neurochecks per stroke protocol  Active Problems:    Hyperlipidemia Pt is on crestor which is continued    History of ankylosing spondylitis   DVT prophylaxis: SCDs for DVT prophylaxis  Code Status:   Full Code  Family Communication:  Diagnosis and plan discussed with patient.  He verbalized understanding agrees with plan.  Further recommendations to follow as clinical indicated Disposition Plan:   Patient is from:  Home  Anticipated DC to:  Home  Anticipated DC date:  Anticipate 2 midnight or less stay in the hospital  Consults called:  Neurology consulted by ER physician  Admission status:  Observation   Yevonne Aline Markella Dao MD [facility]  How to contact the [facility] Attending or Consulting provider [place] or covering provider during after hours [place], for this patient?   Check the care team in [facility] and look for a) attending/consulting [facility] provider listed and b) the [facility] team listed Log into [email or web address] and use [place]'s universal password to access. If you do not have the password, please contact the hospital operator. Locate the [facility] provider you are looking for under [facility] and page to a number that you can be directly reached. If you still have difficulty reaching the provider, please page the [facility] (Director on Call) for the Hospitalists listed on amion for assistance.  03/27/2021, 9:36 PM

## 2021-03-27 NOTE — Consult Note (Signed)
Neurology Consultation  Reason for Consult: R/o Stroke Referring Physician: Harrold Donath, MD  CC: Speech difficulty  HPI: Steven [name] is a 86 y.o. male with hx HLD, ankylosing spondylitis who presented to the ED with episode of difficulty speaking which lasted from lunch time till about 7PM.  Pt went to work this AM in usual state of health.  At lunchtime, he went to get a sandwich but noticed that his speech was gibberish at checkout.  Pt was able to pay and drove himself to [place] afterwards.There, no one could understand him so he left.  He drove himself home.  His wife came home and found that he was speaking gibberish so she called EMS.  His symptoms improved by the time EMS arrived.  Pt reports no other symptoms such as facial droop, blurred vision, numbness or tingling sensation, or unilateral weakness.     Past Medical History:  Diagnosis Date   Allergy    enviromental   Anemia 2017   Anxiety    Arthritis    ankylosing spodilitis   Blood transfusion without reported diagnosis    during surgery   Cataract    Depression    Dyspnea    with exertion - had Echo done 09/29/16   History of kidney stones    Hyperlipidemia    Intestinal obstruction (H[facility])    Pneumonia    as a child   Family History  Problem Relation Age of Onset   Heart attack Mother    Diabetes Mother    Breast cancer Mother    Heart attack Maternal Grandfather    Pancreatic cancer Brother    Colon cancer Neg Hx    Esophageal cancer Neg Hx    Stomach cancer Neg Hx     Social History:   reports that he has quit smoking. He has never used smokeless tobacco. He reports current alcohol use. He reports that he does not use drugs.  Medications  Current Facility-Administered Medications:    0.9 %  sodium chloride infusion, 250 mL, Intravenous, PRN, Chotiner, Yevonne Aline, MD   acetaminophen (TYLENOL) tablet 650 mg, 650 mg, Oral, Q4H PRN **OR** acetaminophen (TYLENOL) 160 MG/5ML solution 650 mg, 650 mg, Per  Tube, Q4H PRN **OR** acetaminophen (TYLENOL) suppository 650 mg, 650 mg, Rectal, Q4H PRN, Chotiner, Yevonne Aline, MD   aspirin EC tablet 81 mg, 81 mg, Oral, Daily, Chotiner, Yevonne Aline, MD, 81 mg at 03/27/21 2159   [START ON 03/28/2021] citalopram (CELEXA) tablet 20 mg, 20 mg, Oral, Daily, Chotiner, Yevonne Aline, MD   polyethylene glycol (MIRALAX / GLYCOLAX) packet 17 g, 17 g, Oral, Daily PRN, Chotiner, Yevonne Aline, MD   rosuvastatin (CRESTOR) tablet 10 mg, 10 mg, Oral, QHS, Chotiner, Yevonne Aline, MD, 10 mg at 03/27/21 2159   sodium chloride flush (NS) 0.9 % injection 3 mL, 3 mL, Intravenous, Q12H, Chotiner, Yevonne Aline, MD, 3 mL at 03/27/21 2159   sodium chloride flush (NS) 0.9 % injection 3 mL, 3 mL, Intravenous, PRN, Chotiner, Yevonne Aline, MD  Current Outpatient Medications:    acetaminophen (TYLENOL) 650 MG CR tablet, Take 650 mg by mouth at bedtime., Disp: , Rfl:    Artificial Tear Solution (SOOTHE XP) SOLN, Place 1 drop into both eyes 2 (two) times daily., Disp: , Rfl:    B Complex-C (B-COMPLEX WITH VITAMIN C) tablet, Take 1 tablet by mouth daily. , Disp: , Rfl:    Biotin 1 MG CAPS, Take 1 capsule by mouth daily. , Disp: ,  Rfl:    Calcium Carbonate-Vitamin D 600-400 MG-UNIT tablet, Take 1 tablet by mouth 2 (two) times daily. , Disp: , Rfl:    cholecalciferol (VITAMIN D) 1000 units tablet, Take 1,000 Units by mouth daily., Disp: , Rfl:    citalopram (CELEXA) 20 MG tablet, Take 20 mg by mouth daily. , Disp: , Rfl:    folic acid (FOLVITE) 1 MG tablet, Take 1 mg by mouth in the morning and at bedtime., Disp: , Rfl:    ipratropium (ATROVENT) 0.06 % nasal spray, Place 2 sprays into both nostrils 2 (two) times daily as needed (allergies)., Disp: , Rfl:    methotrexate (RHEUMATREX) 2.5 MG tablet, Take 20 mg by mouth once a week. TAKE 8 TABLETS (20 MG) BY MOUTH ONCE A WEEK., Disp: , Rfl:    Multiple Vitamins-Minerals (PRESERVISION AREDS 2 PO), Take 1 tablet by mouth in the morning and at bedtime., Disp: , Rfl:     rosuvastatin (CRESTOR) 10 MG tablet, Take 10 mg by mouth at bedtime. , Disp: , Rfl:    polyethylene glycol (MIRALAX / GLYCOLAX) 17 g packet, Take 17 g by mouth daily as needed for mild constipation or moderate constipation (alaso available OTC)., Disp: 30 each, Rfl: 0  ROS:  General ROS: negative for - chills, fatigue, fever, night sweats, weight gain or weight loss Psychological ROS: negative for - behavioral disorder, hallucinations, memory difficulties, mood swings or suicidal ideation Ophthalmic ROS: negative for - blurry vision, double vision, eye pain or loss of vision ENT ROS: negative for - epistaxis, nasal discharge, oral lesions, sore throat, tinnitus or vertigo Allergy and Immunology ROS: negative for - hives or itchy/watery eyes Hematological and Lymphatic ROS: negative for - bleeding problems, bruising or swollen lymph nodes Endocrine ROS: negative for - galactorrhea, hair pattern changes, polydipsia/polyuria or temperature intolerance Respiratory ROS: negative for - cough, hemoptysis, shortness of breath or wheezing Cardiovascular ROS: negative for - chest pain, dyspnea on exertion, edema or irregular heartbeat Gastrointestinal ROS: negative for - abdominal pain, diarrhea, hematemesis, nausea/vomiting or stool incontinence Genito-Urinary ROS: negative for - dysuria, hematuria, incontinence or urinary frequency/urgency Musculoskeletal ROS: negative for - joint swelling or muscular weakness Neurological ROS: as noted in HPI Dermatological ROS: negative for rash and skin lesion changes  Exam: Current vital signs: BP (!) 159/134    Pulse 74    Temp 98.8 F (37.1 C) (Oral)    Resp (!) 26    Ht 5\' 8"  (1.727 m)    Wt 65.8 kg    SpO2 95%    BMI 22.05 kg/m  Vital signs in last 24 hours: Temp:  [98.1 F (36.7 C)-98.8 F (37.1 C)] 98.8 F (37.1 C) (12/15 1854) Pulse Rate:  [66-79] 74 (12/15 2200) Resp:  [14-26] 26 (12/15 2200) BP: (132-166)/(64-134) 159/134 (12/15 2200) SpO2:  [95  %-100 %] 95 % (12/15 2200) Weight:  [65.8 kg-66.2 kg] 65.8 kg (12/15 1857)   Constitutional: Appears well-developed and well-nourished.  Psych: Affect appropriate to situation Eyes: No scleral injection HENT: No OP obstrucion Head: Normocephalic.  Cardiovascular: Normal rate and regular rhythm.  Respiratory: Effort normal, non-labored breathing GI: Soft.  No distension. There is no tenderness.  Skin: WDI  Neuro: AAOx3, fluent speech, no dysarthria, follows commands EOMI, PERRL, no facial asymmetry Tongue and palate midline Nl bulk and tone, no drift Moves all ext antigravity Sensation grossly intact  Labs  Reviewed  CBC    Component Value Date/Time   WBC 6.4 03/27/2021 1909   RBC  3.49 (L) 03/27/2021 1909   HGB 11.8 (L) 03/27/2021 1909   HGB 11.3 (L) 03/27/2021 1248   HGB 13.0 04/20/2012 0425   HCT 35.3 (L) 03/27/2021 1909   HCT 38.5 (L) 04/20/2012 0425   PLT 228 03/27/2021 1909   PLT 234 03/27/2021 1248   PLT 207 04/20/2012 0425   MCV 101.1 (H) 03/27/2021 1909   MCV 95 04/20/2012 0425   MCH 33.8 03/27/2021 1909   MCHC 33.4 03/27/2021 1909   RDW 13.0 03/27/2021 1909   RDW 13.2 04/20/2012 0425   LYMPHSABS 0.6 (L) 03/27/2021 1909   LYMPHSABS 0.9 (L) 04/20/2012 0425   MONOABS 0.6 03/27/2021 1909   MONOABS 0.6 04/20/2012 0425   EOSABS 0.1 03/27/2021 1909   EOSABS 0.2 04/20/2012 0425   BASOSABS 0.0 03/27/2021 1909   BASOSABS 0.0 04/20/2012 0425    CMP     Component Value Date/Time   NA 136 03/27/2021 1909   NA 141 04/20/2012 0425   K 3.9 03/27/2021 1909   K 4.1 04/20/2012 0425   CL 101 03/27/2021 1909   CL 109 (H) 04/20/2012 0425   CO2 27 03/27/2021 1909   CO2 28 04/20/2012 0425   GLUCOSE 126 (H) 03/27/2021 1909   GLUCOSE 144 (H) 04/20/2012 0425   BUN 11 03/27/2021 1909   BUN 14 04/20/2012 0425   CREATININE 0.75 03/27/2021 1909   CREATININE 0.77 03/27/2021 1248   CREATININE 0.95 04/20/2012 0425   CALCIUM 9.0 03/27/2021 1909   CALCIUM 8.2 (L)  04/20/2012 0425   PROT 7.1 03/27/2021 1909   PROT 7.0 04/20/2012 0425   ALBUMIN 3.8 03/27/2021 1909   ALBUMIN 3.5 04/20/2012 0425   AST 20 03/27/2021 1909   AST 18 03/27/2021 1248   ALT 18 03/27/2021 1909   ALT 18 03/27/2021 1248   ALT 41 04/20/2012 0425   ALKPHOS 48 03/27/2021 1909   ALKPHOS 40 (L) 04/20/2012 0425   BILITOT 0.6 03/27/2021 1909   BILITOT 0.9 03/27/2021 1248   GFRNONAA >60 03/27/2021 1909   GFRNONAA >60 03/27/2021 1248   GFRNONAA >60 04/20/2012 0425   GFRAA >60 11/16/2019 1407   GFRAA >60 04/20/2012 0425    Imaging I have reviewed the images obtained:  CT-scan of the brain: No acute intracranial pathology  Assessment: 86 yo M with hx with hx HLD, ankylosing spondylitis who presented to the ED with episode of aphasia lasting from lunch time till about 7PM.    Impression: Stroke v TIA  Recommendations: -MRI of the brain without contrast -f/u CT angio head and neck -Transthoracic Echo, may need TEE and loop monitor  -cont ASA 81mg  daily -cont crestor -BP goal: permissive HTN upto 220/120 mmHg -HBAIC and Lipid profile -Telemetry monitoring -Frequent neuro checks -NPO until passes stroke swallow screen -PT/OT # please page stroke NP  Or  PA  Or MD from 8am -4 pm  as this patient from this time will be  followed by the stroke.   You can look them up on [email or web address]  Password [email or web address]    Total time spent 59min  Ray Church, MD Attending Neurologist

## 2021-03-27 NOTE — ED Provider Notes (Signed)
[facility] EMERGENCY DEPARTMENT Provider Note   CSN: [id number] Arrival date & time: 03/27/21  1849     History No chief complaint on file.   Steven [name] is a 86 y.o. male.  HPI Patient presents with difficulty speaking.  States he was out at lunch and realized he could not talk.  Thinks the words were initially right and has had but could not get them out.  States it was gibberish that came out.  Was able to get his meal however.  Then had trouble checking out due to the ability to speak.  Then got to his car and had difficulty figuring out how to drive.  States he was a little off.  Was able to walk to the car however.  States that he then went to [employer] had difficulty speaking or even getting the words into his head then.  Now feeling much better.  States he went home and talked with his wife after the event and it improved.  Has not an episode like this before.  No headache.  No vision changes.  Has been doing well otherwise.  Had a visit with his hematologist today that was uneventful.  Episode lasted over an hour    Past Medical History:  Diagnosis Date   Allergy    enviromental   Anemia 2017   Anxiety    Arthritis    ankylosing spodilitis   Blood transfusion without reported diagnosis    during surgery   Cataract    Depression    Dyspnea    with exertion - had Echo done 09/29/16   History of kidney stones    Hyperlipidemia    Intestinal obstruction ([place])    Pneumonia    as a child    Patient Active Problem List   Diagnosis Date Noted   Partial small bowel obstruction ([place]) 02/29/2020   Small bowel obstruction due to adhesions ([place]) 02/29/2020   Hypokalemia    Hypomagnesemia    Ankylosing spondylitis of lumbosacral region [facility])    Depression    Small bowel obstruction ([place]) 03/09/2019   Anemia 12/31/2017   Failed arthroplasty ([place]) 11/06/2016   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   Dyspnea on exertion 09/11/2016   SBO  (small bowel obstruction) ([place]) 03/21/2015   Abdominal pain 04/10/2014   Bowel obstruction ([place]) 04/10/2014   Hyperlipidemia 04/10/2014   History of ankylosing spondylitis 04/10/2014    Past Surgical History:  Procedure Laterality Date   ABDOMINAL SURGERY     had abcess   APPENDECTOMY     CHOLECYSTECTOMY     with lysis of adhesions   COLONOSCOPY     COLOSTOMY     COLOSTOMY REVERSAL     EYE SURGERY Left    scar tissue removed from cornea   EYE SURGERY Right    cataract surgery with lens implant   JOINT REPLACEMENT Right    hip  x 2 1999 and 2007   SHOULDER ARTHROSCOPY WITH ROTATOR CUFF REPAIR AND SUBACROMIAL DECOMPRESSION Left 03/02/2013   Procedure: LEFT SHOULDER ARTHROSCOPY WITH SUBACROMIAL DECOMPRESSION DISTAL CALVICLE RESECTION AND ROTATOR CUFF REPAIR ;  Surgeon: [name], MD;  Location: [place];  Service: Orthopedics;  Laterality: Left;   TOTAL HIP REVISION Right 11/06/2016   Procedure: TOTAL HIP REVISION OF THE ACETABULAR COMPONENT;  Surgeon: [name], MD;  Location: [place];  Service: Orthopedics;  Laterality: Right;       Family History  Problem Relation  Age of Onset   Heart attack Mother    Diabetes Mother    Breast cancer Mother    Heart attack Maternal Grandfather    Pancreatic cancer Brother    Colon cancer Neg Hx    Esophageal cancer Neg Hx    Stomach cancer Neg Hx     Social History   Tobacco Use   Smoking status: Former   Smokeless tobacco: Never  Vaping Use   Vaping Use: Never used  Substance Use Topics   Alcohol use: Yes    Comment: 3 beers or glasses of wine a day--reports stopped ETOH 11/01/16    Drug use: No    Home Medications Prior to Admission medications   Medication Sig Start Date End Date Taking? Authorizing Provider  acetaminophen (TYLENOL) 650 MG CR tablet Take 650 mg by mouth at bedtime.   Yes [provider]  Artificial Tear Solution (SOOTHE XP) SOLN Place 1 drop into both eyes 2 (two) times daily.   Yes [provider]  B Complex-C (B-COMPLEX WITH VITAMIN C) tablet Take 1 tablet by mouth daily.    Yes [provider]  Biotin 1 MG CAPS Take 1 capsule by mouth daily.    Yes [provider]  Calcium Carbonate-Vitamin D 600-400 MG-UNIT tablet Take 1 tablet by mouth 2 (two) times daily.    Yes [provider]  cholecalciferol (VITAMIN D) 1000 units tablet Take 1,000 Units by mouth daily.   Yes [provider]  citalopram (CELEXA) 20 MG tablet Take 20 mg by mouth daily.    Yes [provider]  folic acid (FOLVITE) 1 MG tablet Take 1 mg by mouth in the morning and at bedtime.   Yes [provider]  ipratropium (ATROVENT) 0.06 % nasal spray Place 2 sprays into both nostrils 2 (two) times daily as needed (allergies). 06/05/19  Yes [provider]  methotrexate (RHEUMATREX) 2.5 MG tablet Take 20 mg by mouth once a week. TAKE 8 TABLETS (20 MG) BY MOUTH ONCE A WEEK.   Yes [provider]  Multiple Vitamins-Minerals (PRESERVISION AREDS 2 PO) Take 1 tablet by mouth in the morning and at bedtime.   Yes [provider]  rosuvastatin (CRESTOR) 10 MG tablet Take 10 mg by mouth at bedtime.  08/16/19  Yes [provider]  polyethylene glycol (MIRALAX / GLYCOLAX) 17 g packet Take 17 g by mouth daily as needed for mild constipation or moderate constipation (alaso available OTC). 03/02/20   Rai, Vernelle Emerald, MD    Allergies    Other, Indomethacin, and Lactose intolerance (gi)  Review of Systems   Review of Systems  Constitutional:  Negative for appetite change.  HENT:  Negative for congestion.   Respiratory:  Negative for shortness of breath.   Cardiovascular:  Negative for chest pain.  Gastrointestinal:  Negative for abdominal pain.  Genitourinary:  Negative for flank pain.  Musculoskeletal:  Negative for back pain.  Skin:  Negative for rash.  Neurological:  Positive for speech difficulty.  Psychiatric/Behavioral:  Positive  for confusion.    Physical Exam Updated Vital Signs BP (!) 142/83    Pulse 72    Temp 98.8 F (37.1 C) (Oral)    Resp 19    Ht 5\' 8"  (1.727 m)    Wt 65.8 kg    SpO2 98%    BMI 22.05 kg/m   Physical Exam Vitals and nursing note reviewed.  Constitutional:      Appearance: Normal appearance.  HENT:     Head: Atraumatic.  Eyes:     Pupils: Pupils are equal, round, and reactive to light.  Cardiovascular:     Rate and Rhythm: Regular rhythm.  Pulmonary:     Breath sounds: Normal breath sounds.  Abdominal:     Tenderness: There is no abdominal tenderness.  Musculoskeletal:        General: No tenderness.     Cervical back: Neck supple.  Skin:    General: Skin is warm.  Neurological:     General: No focal deficit present.     Mental Status: He is alert and oriented to person, place, and time.     Comments: Face metric.  Eye movements intact.  Awake and appropriate.  Occasionally does have a little difficulty searching for a word but patient states he feels if he is back at baseline.    ED Results / Procedures / Treatments   Labs (all labs ordered are listed, but only abnormal results are displayed) Labs Reviewed  CBC - Abnormal; Notable for the following components:      Result Value   RBC 3.49 (*)    Hemoglobin 11.8 (*)    HCT 35.3 (*)    MCV 101.1 (*)    All other components within normal limits  DIFFERENTIAL - Abnormal; Notable for the following components:   Lymphs Abs 0.6 (*)    All other components within normal limits  COMPREHENSIVE METABOLIC PANEL - Abnormal; Notable for the following components:   Glucose, Bld 126 (*)    All other components within normal limits  CBG MONITORING, ED - Abnormal; Notable for the following components:   Glucose-Capillary 130 (*)    All other components within normal limits  RESP PANEL BY RT-PCR (FLU A&B, COVID) ARPGX2  URINALYSIS, ROUTINE W REFLEX MICROSCOPIC  ETHANOL  PROTIME-INR  APTT  RAPID URINE DRUG SCREEN, HOSP PERFORMED     EKG None  Radiology CT HEAD WO CONTRAST (5MM)  Result Date: 03/27/2021 CLINICAL DATA:  Slurred speech, generalized weakness EXAM: CT HEAD WITHOUT CONTRAST TECHNIQUE: Contiguous axial images were obtained from the base of the skull through the vertex without intravenous contrast. COMPARISON:  None. FINDINGS: Brain: No evidence of acute infarction, hemorrhage, hydrocephalus, extra-axial collection or mass lesion/mass effect. Global cortical atrophy. Subcortical white matter and periventricular small vessel ischemic changes. Vascular: No hyperdense vessel or unexpected calcification. Skull: Normal. Negative for fracture or focal lesion. Sinuses/Orbits: The visualized paranasal sinuses are essentially clear. The mastoid air cells are unopacified. Other: None. IMPRESSION: No evidence of acute intracranial abnormality. Atrophy with small vessel ischemic changes. Electronically Signed   By: Julian Hy M.D.   On: 03/27/2021 19:48    Procedures Procedures   Medications Ordered in ED Medications - No data to display  ED Course  I have reviewed the triage vital signs and the nursing notes.  Pertinent labs & imaging results that were available during my care of the patient were reviewed by me and considered in my medical decision making (see chart for details).    MDM Rules/Calculators/A&P                         Patient with episode of difficulty speaking.  May have been slight confusion with it but it sounds as if it was mostly just a difficulty speaking.  Lasted over an hour.  Feels as if he is back to baseline now.  Head CT reassuring.  Blood work overall  reassuring also.  However his ABCD 2 score would be 5.  This puts him a moderate risk.  I feel that he could benefit from admission to the hospital for further work-up.  Will discuss with hospitalist    Final Clinical Impression(s) / ED Diagnoses Final diagnoses:  TIA (transient ischemic attack)    Rx / DC Orders ED Discharge  Orders     None        Davonna Belling, MD 03/27/21 2103

## 2021-03-28 ENCOUNTER — Observation Stay (HOSPITAL_COMMUNITY): Payer: Medicare Other

## 2021-03-28 DIAGNOSIS — Z20822 Contact with and (suspected) exposure to covid-19: Secondary | ICD-10-CM | POA: Diagnosis present

## 2021-03-28 DIAGNOSIS — Z7982 Long term (current) use of aspirin: Secondary | ICD-10-CM

## 2021-03-28 DIAGNOSIS — R29702 NIHSS score 2: Secondary | ICD-10-CM | POA: Diagnosis not present

## 2021-03-28 DIAGNOSIS — Z87891 Personal history of nicotine dependence: Secondary | ICD-10-CM | POA: Diagnosis not present

## 2021-03-28 DIAGNOSIS — I6522 Occlusion and stenosis of left carotid artery: Secondary | ICD-10-CM | POA: Diagnosis not present

## 2021-03-28 DIAGNOSIS — Z961 Presence of intraocular lens: Secondary | ICD-10-CM | POA: Diagnosis present

## 2021-03-28 DIAGNOSIS — E7800 Pure hypercholesterolemia, unspecified: Secondary | ICD-10-CM

## 2021-03-28 DIAGNOSIS — I639 Cerebral infarction, unspecified: Secondary | ICD-10-CM | POA: Diagnosis not present

## 2021-03-28 DIAGNOSIS — E739 Lactose intolerance, unspecified: Secondary | ICD-10-CM | POA: Diagnosis present

## 2021-03-28 DIAGNOSIS — D62 Acute posthemorrhagic anemia: Secondary | ICD-10-CM | POA: Diagnosis not present

## 2021-03-28 DIAGNOSIS — I6503 Occlusion and stenosis of bilateral vertebral arteries: Secondary | ICD-10-CM | POA: Diagnosis not present

## 2021-03-28 DIAGNOSIS — I959 Hypotension, unspecified: Secondary | ICD-10-CM

## 2021-03-28 DIAGNOSIS — M459 Ankylosing spondylitis of unspecified sites in spine: Secondary | ICD-10-CM | POA: Diagnosis present

## 2021-03-28 DIAGNOSIS — R4701 Aphasia: Secondary | ICD-10-CM | POA: Diagnosis present

## 2021-03-28 DIAGNOSIS — R29701 NIHSS score 1: Secondary | ICD-10-CM | POA: Diagnosis not present

## 2021-03-28 DIAGNOSIS — Z9841 Cataract extraction status, right eye: Secondary | ICD-10-CM | POA: Diagnosis not present

## 2021-03-28 DIAGNOSIS — I951 Orthostatic hypotension: Secondary | ICD-10-CM | POA: Diagnosis not present

## 2021-03-28 DIAGNOSIS — R4781 Slurred speech: Secondary | ICD-10-CM | POA: Diagnosis not present

## 2021-03-28 DIAGNOSIS — G459 Transient cerebral ischemic attack, unspecified: Secondary | ICD-10-CM | POA: Diagnosis not present

## 2021-03-28 DIAGNOSIS — Z006 Encounter for examination for normal comparison and control in clinical research program: Secondary | ICD-10-CM | POA: Diagnosis not present

## 2021-03-28 DIAGNOSIS — Z8739 Personal history of other diseases of the musculoskeletal system and connective tissue: Secondary | ICD-10-CM | POA: Diagnosis not present

## 2021-03-28 DIAGNOSIS — I10 Essential (primary) hypertension: Secondary | ICD-10-CM | POA: Diagnosis present

## 2021-03-28 DIAGNOSIS — F32A Depression, unspecified: Secondary | ICD-10-CM | POA: Diagnosis present

## 2021-03-28 DIAGNOSIS — F419 Anxiety disorder, unspecified: Secondary | ICD-10-CM | POA: Diagnosis present

## 2021-03-28 DIAGNOSIS — Z7902 Long term (current) use of antithrombotics/antiplatelets: Secondary | ICD-10-CM

## 2021-03-28 DIAGNOSIS — E785 Hyperlipidemia, unspecified: Secondary | ICD-10-CM | POA: Diagnosis present

## 2021-03-28 DIAGNOSIS — R29700 NIHSS score 0: Secondary | ICD-10-CM | POA: Diagnosis present

## 2021-03-28 DIAGNOSIS — I63232 Cerebral infarction due to unspecified occlusion or stenosis of left carotid arteries: Secondary | ICD-10-CM | POA: Diagnosis not present

## 2021-03-28 DIAGNOSIS — D509 Iron deficiency anemia, unspecified: Secondary | ICD-10-CM | POA: Diagnosis present

## 2021-03-28 DIAGNOSIS — R479 Unspecified speech disturbances: Secondary | ICD-10-CM | POA: Diagnosis present

## 2021-03-28 DIAGNOSIS — G319 Degenerative disease of nervous system, unspecified: Secondary | ICD-10-CM | POA: Diagnosis not present

## 2021-03-28 DIAGNOSIS — R29703 NIHSS score 3: Secondary | ICD-10-CM | POA: Diagnosis not present

## 2021-03-28 DIAGNOSIS — Z79899 Other long term (current) drug therapy: Secondary | ICD-10-CM | POA: Diagnosis not present

## 2021-03-28 DIAGNOSIS — I6389 Other cerebral infarction: Secondary | ICD-10-CM

## 2021-03-28 DIAGNOSIS — Z8249 Family history of ischemic heart disease and other diseases of the circulatory system: Secondary | ICD-10-CM | POA: Diagnosis not present

## 2021-03-28 DIAGNOSIS — Z886 Allergy status to analgesic agent status: Secondary | ICD-10-CM | POA: Diagnosis not present

## 2021-03-28 DIAGNOSIS — Z79631 Long term (current) use of antimetabolite agent: Secondary | ICD-10-CM | POA: Diagnosis not present

## 2021-03-28 DIAGNOSIS — D539 Nutritional anemia, unspecified: Secondary | ICD-10-CM | POA: Diagnosis present

## 2021-03-28 LAB — LIPID PANEL
Cholesterol: 150 mg/dL (ref 0–200)
HDL: 50 mg/dL (ref >40)
LDL Cholesterol: 66 mg/dL (ref 0–99)
Total CHOL/HDL Ratio: 3 RATIO
Triglycerides: 171 mg/dL — ABNORMAL HIGH (ref <150)
VLDL: 34 mg/dL (ref 0–40)

## 2021-03-28 LAB — ECHOCARDIOGRAM COMPLETE
Area-P 1/2: 3.17 cm2
Calc EF: 54.8 %
Height: 68 in
S' Lateral: 2.4 cm
Single Plane A2C EF: 59.7 %
Single Plane A4C EF: 52.9 %
Weight: 2320 oz

## 2021-03-28 LAB — HEMOGLOBIN A1C
Hgb A1c MFr Bld: 6.4 % — ABNORMAL HIGH (ref 4.8–5.6)
Mean Plasma Glucose: 136.98 mg/dL

## 2021-03-28 MED ORDER — CLOPIDOGREL BISULFATE 75 MG PO TABS
300.0000 mg | ORAL_TABLET | Freq: Once | ORAL | Status: AC
  Administered 2021-03-28: 300 mg via ORAL
  Filled 2021-03-28: qty 4

## 2021-03-28 MED ORDER — ASPIRIN EC 325 MG PO TBEC
325.0000 mg | DELAYED_RELEASE_TABLET | Freq: Every day | ORAL | Status: DC
  Administered 2021-03-28 – 2021-04-01 (×4): 325 mg via ORAL
  Filled 2021-03-28 (×4): qty 1

## 2021-03-28 MED ORDER — SODIUM CHLORIDE 0.9 % IV BOLUS
500.0000 mL | Freq: Once | INTRAVENOUS | Status: AC
  Administered 2021-03-28: 500 mL via INTRAVENOUS

## 2021-03-28 MED ORDER — CLOPIDOGREL BISULFATE 75 MG PO TABS
75.0000 mg | ORAL_TABLET | Freq: Every day | ORAL | Status: DC
  Administered 2021-03-29 – 2021-04-01 (×4): 75 mg via ORAL
  Filled 2021-03-28 (×4): qty 1

## 2021-03-28 NOTE — Evaluation (Signed)
Physical Therapy Evaluation Patient Details Name: Steven [name] MRN: [id number] DOB: September 22, 1934 Today's Date: 03/28/2021  History of Present Illness  Steven [name] is a 86 y.o. male with medical history significant for HLD, ankylosing spodilitis, hx of SBO due to adhesions with colostomy then reversal, R THA x2, L shoulder DCR, RCR admitted due to transiet aphasia. Undergoing work up for TIA and for possible carotid endarterectomy.  Clinical Impression  Patient presents with decreased mobility due to generalized weakness and feeling light headed with ambulation needing minguard A and occasional wall rail use in hallway.  Noted BP on lower side, but no complaints of dizziness up on EOB.  Patient completed toileting and brushing teeth in addition to ambulation and stairs.  Feel he should be able to d/c home with family support and may benefit from outpatient PT at d/c depending on progress.        Recommendations for follow up therapy are one component of a multi-disciplinary discharge planning process, led by the attending physician.  Recommendations may be updated based on patient status, additional functional criteria and insurance authorization.  Follow Up Recommendations Outpatient PT (versus no follow up depending on progress)    Assistance Recommended at Discharge Intermittent Supervision/Assistance  Functional Status Assessment Patient has had a recent decline in their functional status and demonstrates the ability to make significant improvements in function in a reasonable and predictable amount of time.  Equipment Recommendations  None recommended by PT    Recommendations for Other Services       Precautions / Restrictions Precautions Precautions: Fall      Mobility  Bed Mobility Overal bed mobility: Modified Independent             General bed mobility comments: increased time    Transfers Overall transfer level: Needs assistance Equipment used: None Transfers:  Sit to/from Stand Sit to Stand: Min guard           General transfer comment: assist for balance    Ambulation/Gait Ambulation/Gait assistance: Min guard;Supervision Gait Distance (Feet): 130 Feet Assistive device: None (occasionally used wall rail) Gait Pattern/deviations: Step-through pattern;Decreased stride length;Shuffle       General Gait Details: mild instability with ambulation and pt reports feels mild light headedness and LE weakness so used wall rail and intermittent minguard A  Stairs Stairs: Yes Stairs assistance: Supervision;Min guard Stair Management: Two rails;Alternating pattern;Step to pattern;Forwards Number of Stairs: 4 General stair comments: slower especially with descent, assist for safety  Wheelchair Mobility    Modified Rankin (Stroke Patients Only) Modified Rankin (Stroke Patients Only) Pre-Morbid Rankin Score: No significant disability Modified Rankin: Moderate disability     Balance Overall balance assessment: Needs assistance   Sitting balance-[name]: Good Sitting balance - Comments: stiffness more than balance hindering pt pulling up socks in sitting, but completed toilet hygiene on his own in bathroom   Standing balance support: During functional activity;No upper extremity supported;Single extremity supported Standing balance-[name]: Fair Standing balance comment: standing at sink to brush teeth, wash hands                             Pertinent Vitals/Pain Pain Assessment: No/denies pain    Home Living Family/patient expects to be discharged to:: Private residence Living Arrangements: Spouse/significant other Available Help at Discharge: Family;Available 24 hours/day Type of Home: House       Alternate Level Stairs-Number of Steps: 13 Home Layout: Two level (loft  upstairs) Home Equipment: Shower seat;Grab bars - tub/shower;Rolling Walker (2 wheels);Cane - single point      Prior Function Prior Level  of Function : Independent/Modified Independent             Mobility Comments: denies falls in past 6 months ADLs Comments: independent and helps with dishwashing, etc, driving     Hand Dominance   Dominant Hand: Right    Extremity/Trunk Assessment   Upper Extremity Assessment Upper Extremity Assessment: Overall WFL for tasks assessed;Generalized weakness (arthritic changes noted in hands, slower but completes ADL's with S)    Lower Extremity Assessment Lower Extremity Assessment: Generalized weakness    Cervical / Trunk Assessment Cervical / Trunk Assessment: Kyphotic;Other exceptions Cervical / Trunk Exceptions: cervical stiffness from ankylosing spondylitis  Communication   Communication: No difficulties  Cognition Arousal/Alertness: Awake/alert Behavior During Therapy: WFL for tasks assessed/performed Overall Cognitive Status: Within Functional Limits for tasks assessed                                          General Comments General comments (skin integrity, edema, etc.): BP seated 97/58; no c/o dizziness sitting, but some with ambulation, HR 70's-80's    Exercises     Assessment/Plan    PT Assessment Patient needs continued PT services  PT Problem List Decreased strength;Decreased mobility;Decreased balance;Decreased knowledge of use of DME;Decreased activity tolerance;Decreased range of motion       PT Treatment Interventions DME instruction;Therapeutic activities;Gait training;Therapeutic exercise;Stair training;Balance training;Functional mobility training;Patient/family education    PT Goals (Current goals can be found in the Care Plan section)  Acute Rehab PT Goals Patient Stated Goal: to return to independent PT Goal Formulation: With patient Time For Goal Achievement: 04/11/21 Potential to Achieve Goals: Good    Frequency Min 4X/week   Barriers to discharge        Co-evaluation               AM-PAC PT "6 Clicks"  Mobility  Outcome Measure Help needed turning from your back to your side while in a flat bed without using bedrails?: None Help needed moving from lying on your back to sitting on the side of a flat bed without using bedrails?: A Little Help needed moving to and from a bed to a chair (including a wheelchair)?: A Little Help needed standing up from a chair using your arms (e.g., wheelchair or bedside chair)?: A Little Help needed to walk in hospital room?: A Little Help needed climbing 3-5 steps with a railing? : A Little 6 Click Score: 19    End of Session   Activity Tolerance: Patient tolerated treatment well Patient left: in chair;with call bell/phone within reach   PT Visit Diagnosis: Other abnormalities of gait and mobility (R26.89);Muscle weakness (generalized) (M62.81)    Time: 1000-1040 PT Time Calculation (min) (ACUTE ONLY): 40 min   Charges:   PT Evaluation $PT Eval Moderate Complexity: 1 Mod PT Treatments $Gait Training: 8-22 mins $Therapeutic Activity: 8-22 mins        Magda Kiel, PT Acute Rehabilitation Services GYIRS:[phone] Office:[phone] 03/28/2021   Reginia Naas 03/28/2021, 11:02 AM

## 2021-03-28 NOTE — Progress Notes (Signed)
°  Echocardiogram 2D Echocardiogram has been performed.  Steven [name] 03/28/2021, 2:53 PM

## 2021-03-28 NOTE — Evaluation (Signed)
Speech Language Pathology Evaluation Patient Details Name: Steven [name] MRN: [id number] DOB: 1934-09-12 Today's Date: 03/28/2021 Time: 6237-6283 SLP Time Calculation (min) (ACUTE ONLY): 13 min  Problem List:  Patient Active Problem List   Diagnosis Date Noted   TIA (transient ischemic attack) 03/27/2021   Partial small bowel obstruction ([place]) 02/29/2020   Small bowel obstruction due to adhesions ([place]) 02/29/2020   Hypokalemia    Hypomagnesemia    Ankylosing spondylitis of lumbosacral region [facility])    Depression    Small bowel obstruction ([place]) 03/09/2019   Anemia 12/31/2017   Failed arthroplasty ([place]) 11/06/2016   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   Dyspnea on exertion 09/11/2016   SBO (small bowel obstruction) ([place]) 03/21/2015   Abdominal pain 04/10/2014   Bowel obstruction ([place]) 04/10/2014   Hyperlipidemia 04/10/2014   History of ankylosing spondylitis 04/10/2014   Past Medical History:  Past Medical History:  Diagnosis Date   Allergy    enviromental   Anemia 2017   Anxiety    Arthritis    ankylosing spodilitis   Blood transfusion without reported diagnosis    during surgery   Cataract    Depression    Dyspnea    with exertion - had Echo done 09/29/16   History of kidney stones    Hyperlipidemia    Intestinal obstruction ([place])    Pneumonia    as a child   Past Surgical History:  Past Surgical History:  Procedure Laterality Date   ABDOMINAL SURGERY     had abcess   APPENDECTOMY     CHOLECYSTECTOMY     with lysis of adhesions   COLONOSCOPY     COLOSTOMY     COLOSTOMY REVERSAL     EYE SURGERY Left    scar tissue removed from cornea   EYE SURGERY Right    cataract surgery with lens implant   JOINT REPLACEMENT Right    hip  x 2 1999 and 2007   SHOULDER ARTHROSCOPY WITH ROTATOR CUFF REPAIR AND SUBACROMIAL DECOMPRESSION Left 03/02/2013   Procedure: LEFT SHOULDER ARTHROSCOPY WITH SUBACROMIAL DECOMPRESSION DISTAL CALVICLE RESECTION AND  ROTATOR CUFF REPAIR ;  Surgeon: Steven Shutter, MD;  Location: [place];  Service: Orthopedics;  Laterality: Left;   TOTAL HIP REVISION Right 11/06/2016   Procedure: TOTAL HIP REVISION OF THE ACETABULAR COMPONENT;  Surgeon: Steven Pear, MD;  Location: [place];  Service: Orthopedics;  Laterality: Right;   HPI:  86 y.o. male presented via EMS with aphasia, resolved upon arrival to ED. PMHx significant for HLD, ankylosing spodilitis, hx of SBO due to adhesions.  MRI showed punctate focus of mild restricted diffusion in the posterior  left frontal lobe ; CTA 70% stenosis left ICA.   Assessment / Plan / Recommendation Clinical Impression  Pt presents with normal expressive and receptive language; speech is fluent and without dysarthria. Cognition at baseline. Aphasia has resolved - no f/u SLP needs. D/W pt. Our service will sign off.    SLP Assessment  SLP Recommendation/Assessment: Patient does not need any further Speech [facility] Services SLP Visit Diagnosis: Aphasia (R47.01)    Recommendations for follow up therapy are one component of a multi-disciplinary discharge planning process, led by the attending physician.  Recommendations may be updated based on patient status, additional functional criteria and insurance authorization.    Follow Up Recommendations  No SLP follow up    Assistance Recommended at Discharge  None  Functional Status Assessment    Frequency and Duration  SLP Evaluation Cognition  Overall Cognitive Status: Within Functional Limits for tasks assessed       Comprehension  Auditory Comprehension Overall Auditory Comprehension: Appears within functional limits for tasks assessed Yes/No Questions: Within Functional Limits Commands: Within Functional Limits Visual Recognition/Discrimination Discrimination: Within Function Limits Reading Comprehension Reading Status: Within funtional limits    Expression Expression Primary Mode of Expression:  Verbal Verbal Expression Overall Verbal Expression: Appears within functional limits for tasks assessed Written Expression Dominant Hand: Right   Oral / Motor  Oral Motor/Sensory Function Overall Oral Motor/Sensory Function: Within functional limits Motor Speech Overall Motor Speech: Appears within functional limits for tasks assessed           Steven [name] L. Steven [name], [place] CCC/SLP Acute Rehabilitation Services Office number [phone] Pager [phone]  Steven [name] Steven [name] 03/28/2021, 11:03 AM

## 2021-03-28 NOTE — Plan of Care (Signed)
Pt is alert oriented x 4. Pt denies pain, pt had low grade fever, PRN tylenol given. Pt has right facial droop, right leg drift.    Problem: Education: Goal: Knowledge of General Education information will improve Description: Including pain rating scale, medication(s)/side effects and non-pharmacologic comfort measures Outcome: Progressing   Problem: Health Behavior/Discharge Planning: Goal: Ability to manage health-related needs will improve Outcome: Progressing   Problem: Clinical Measurements: Goal: Ability to maintain clinical measurements within normal limits will improve Outcome: Progressing Goal: Will remain free from infection Outcome: Progressing Goal: Diagnostic test results will improve Outcome: Progressing Goal: Respiratory complications will improve Outcome: Progressing Goal: Cardiovascular complication will be avoided Outcome: Progressing   Problem: Nutrition: Goal: Adequate nutrition will be maintained Outcome: Progressing   Problem: Coping: Goal: Level of anxiety will decrease Outcome: Progressing   Problem: Elimination: Goal: Will not experience complications related to bowel motility Outcome: Progressing Goal: Will not experience complications related to urinary retention Outcome: Progressing   Problem: Pain Managment: Goal: General experience of comfort will improve Outcome: Progressing   Problem: Safety: Goal: Ability to remain free from injury will improve Outcome: Progressing   Problem: Skin Integrity: Goal: Risk for impaired skin integrity will decrease Outcome: Progressing   Problem: Education: Goal: Knowledge of disease or condition will improve Outcome: Progressing Goal: Knowledge of secondary prevention will improve (SELECT ALL) Outcome: Progressing Goal: Knowledge of patient specific risk factors will improve (INDIVIDUALIZE FOR PATIENT) Outcome: Progressing Goal: Individualized Educational Video(s) Outcome: Progressing    Problem: Coping: Goal: Will verbalize positive feelings about self Outcome: Progressing Goal: Will identify appropriate support needs Outcome: Progressing   Problem: Self-Care: Goal: Ability to participate in self-care as condition permits will improve Outcome: Progressing

## 2021-03-28 NOTE — Progress Notes (Signed)
PROGRESS NOTE   Steven [name]  XBM:[id number]    DOB: 01-Oct-1934    DOA: 03/27/2021  PCP: [name], MD   I have briefly reviewed patients previous medical records in [facility].  Chief complaint: Speech difficulty   Brief Narrative:  86 year old male with medical history significant for hyperlipidemia, ankylosing spondylitis, anxiety and depression, presented to ED on 03/27/2021 with speech difficulty which lasted for approximately 6 hours on day of admission.  Admitted for acute ischemic stroke suspected due to symptomatic left ICA stenosis.  Neurology/stroke MD and vascular surgery were consulted.  Evaluation ongoing.   Assessment & Plan:  Principal Problem:   TIA (transient ischemic attack) Active Problems:   Hyperlipidemia   History of ankylosing spondylitis   Acute ischemic stroke (posterior left frontal lobe) secondary to symptomatic left ICA stenosis: -CT head 12/15: No acute intracranial abnormality. -MRI brain: Punctate focus of restricted diffusion in the posterior left frontal lobe, which may represent acute or subacute infarct. -CTA head and neck: Approximately 70% stenosis of the proximal left ICA.  No other hemodynamically significant stenosis in the neck.  No intracranial large vessel occlusion or significant stenosis. -2D echo: Pending. -A1c 6.4.  LDL 66. -PT recommends outpatient PT versus no follow-up.  OT and SLP have no follow-up needs -Not on antiplatelets PTA.  Now on DAPT with aspirin 325 Mg daily + Plavix 75 Mg daily.  Continue statins. -Neurology consultation appreciated.  Discussed with stroke MD who recommended vascular surgery consultation to address left ICA stenosis.  Symptomatic left ICA stenosis -Vascular surgery consultation appreciated and indicated that he will need either a left CEA versus TCAR.  Hypotension -SBP at bedside noted in the 90s-100s.  Not on antihypertensives.  States that his blood pressures usually are in the  110s. -Avoid hypotension given left ICA stenosis and risk of worsening stroke. -Neurology has given an IV fluid bolus 500 mL and may consider starting him on midodrine.  Hyperlipidemia - Continue Crestor.  Ankylosing spondylitis -On weekly methotrexate and daily folic acid supplements for same.  Anxiety and depression -Does not appear to be on any medications for this.  Macrocytic anemia -May be related to anemia of chronic disease versus other etiology.  Stable.  Outpatient follow-up.  Body mass index is 22.05 kg/m.    DVT prophylaxis: SCD's Start: 03/27/21 2142     Code Status: Full Code Family Communication: None at bedside. Disposition:  Status is: Observation  The patient will require care spanning > 2 midnights and should be moved to inpatient because: Hypotensive in the context of symptomatic left ICA stenosis and acute stroke, needed IV fluid bolus, will need close monitoring and management of his hypotension.  May need to start midodrine.       Consultants:   Neurology Vascular surgery  Procedures:   None  Antimicrobials:   None   Subjective:  Seen this morning along with SLP followed by stroke team in the room.  Patient denies complaints.  No recurrence of speech difficulty since resolution at around 7 or 8 PM last night.  Never had the symptoms before.  Denies any other strokelike symptoms.  No dizziness or lightheadedness.  Objective:   Vitals:   03/28/21 0822 03/28/21 1100 03/28/21 1207 03/28/21 1300  BP: 116/62 (!) 102/56 (!) 104/55 (!) 124/59  Pulse: 60  66   Resp: 16 20 16  (!) 21  Temp: 98.7 F (37.1 C)  98.5 F (36.9 C)   TempSrc: Oral  Oral  SpO2: 98%     Weight:      Height:        General exam: Elderly male, moderately built and thinly nourished sitting up comfortably in reclining chair without distress. Respiratory system: Clear to auscultation. Respiratory effort normal. Cardiovascular system: S1 & S2 heard, RRR. No JVD, murmurs,  rubs, gallops or clicks. No pedal edema.  Telemetry personally reviewed: Sinus rhythm. Gastrointestinal system: Abdomen is nondistended, soft and nontender. No organomegaly or masses felt. Normal bowel sounds heard. Central nervous system: Alert and oriented. No focal neurological deficits. Extremities: Symmetric 5 x 5 power. Skin: No rashes, lesions or ulcers Psychiatry: Judgement and insight appear normal. Mood & affect appropriate.     Data Reviewed:   I have personally reviewed following labs and imaging studies   CBC: Recent Labs  Lab 03/27/21 1248 03/27/21 1909  WBC 6.5 6.4  NEUTROABS 5.1 5.0  HGB 11.3* 11.8*  HCT 33.8* 35.3*  MCV 98.5 101.1*  PLT 234 381    Basic Metabolic Panel: Recent Labs  Lab 03/27/21 1248 03/27/21 1909  NA 138 136  K 4.0 3.9  CL 103 101  CO2 27 27  GLUCOSE 106* 126*  BUN 13 11  CREATININE 0.77 0.75  CALCIUM 9.0 9.0    Liver Function Tests: Recent Labs  Lab 03/27/21 1248 03/27/21 1909  AST 18 20  ALT 18 18  ALKPHOS 56 48  BILITOT 0.9 0.6  PROT 7.4 7.1  ALBUMIN 3.8 3.8    CBG: Recent Labs  Lab 03/27/21 1903  GLUCAP 130*    Microbiology Studies:   Recent Results (from the past 240 hour(s))  Resp Panel by RT-PCR (Flu A&B, Covid) Nasopharyngeal Swab     Status: None   Collection Time: 03/27/21  7:09 PM   Specimen: Nasopharyngeal Swab; Nasopharyngeal(NP) swabs in vial transport medium  Result Value Ref Range Status   SARS Coronavirus 2 by RT PCR NEGATIVE NEGATIVE Final    Comment: (NOTE) SARS-CoV-2 target nucleic acids are NOT DETECTED.  The SARS-CoV-2 RNA is generally detectable in upper respiratory specimens during the acute phase of infection. The lowest concentration of SARS-CoV-2 viral copies this assay can detect is 138 copies/mL. A negative result does not preclude SARS-Cov-2 infection and should not be used as the sole basis for treatment or other patient management decisions. A negative result may occur with   improper specimen collection/handling, submission of specimen other than nasopharyngeal swab, presence of viral mutation(s) within the areas targeted by this assay, and inadequate number of viral copies(<138 copies/mL). A negative result must be combined with clinical observations, patient history, and epidemiological information. The expected result is Negative.  Fact Sheet for Patients:  EntrepreneurPulse.com.au  Fact Sheet for Healthcare Providers:  IncredibleEmployment.be  This test is no t yet approved or cleared by the [place] FDA and  has been authorized for detection and/or diagnosis of SARS-CoV-2 by FDA under an Emergency Use Authorization (EUA). This EUA will remain  in effect (meaning this test can be used) for the duration of the COVID-19 declaration under Section 564(b)(1) of the Act, 21 U.S.C.section 360bbb-3(b)(1), unless the authorization is terminated  or revoked sooner.       Influenza A by PCR NEGATIVE NEGATIVE Final   Influenza B by PCR NEGATIVE NEGATIVE Final    Comment: (NOTE) The Xpert Xpress SARS-CoV-2/FLU/RSV plus assay is intended as an aid in the diagnosis of influenza from Nasopharyngeal swab specimens and should not be used as a sole basis for treatment.  Nasal washings and aspirates are unacceptable for Xpert Xpress SARS-CoV-2/FLU/RSV testing.  Fact Sheet for Patients: EntrepreneurPulse.com.au  Fact Sheet for Healthcare Providers: IncredibleEmployment.be  This test is not yet approved or cleared by the [place] FDA and has been authorized for detection and/or diagnosis of SARS-CoV-2 by FDA under an Emergency Use Authorization (EUA). This EUA will remain in effect (meaning this test can be used) for the duration of the COVID-19 declaration under Section 564(b)(1) of the Act, 21 U.S.C. section 360bbb-3(b)(1), unless the authorization is terminated  or revoked.  Performed at [facility], [place] [address], [place],  [address]     Radiology Studies:  CT ANGIO HEAD NECK W WO CM  Result Date: 03/27/2021 CLINICAL DATA:  Stroke suspected, aphasia, slurred speech EXAM: CT ANGIOGRAPHY HEAD AND NECK TECHNIQUE: Multidetector CT imaging of the head and neck was performed using the standard protocol during bolus administration of intravenous contrast. Multiplanar CT image reconstructions and MIPs were obtained to evaluate the vascular anatomy. Carotid stenosis measurements (when applicable) are obtained utilizing NASCET criteria, using the distal internal carotid diameter as the denominator. CONTRAST:  68mL OMNIPAQUE IOHEXOL 350 MG/ML SOLN COMPARISON:  CT head 03/27/2021.  No prior CTA. FINDINGS: CT HEAD FINDINGS For noncontrast findings, please see same day CT head. CTA NECK FINDINGS Aortic arch: Standard branching. Imaged portion shows no evidence of aneurysm or dissection. No significant stenosis of the major arch vessel origins. Aortic atherosclerosis. Right carotid system: No evidence of dissection, stenosis (50% or greater) or occlusion. Calcifications in the bifurcation and in the proximal right ICA, which are not hemodynamically significant. Left carotid system: Approximately 70% stenosis of the proximal left ICA, secondary to calcified and noncalcified plaque (series 6, image 174). No evidence of dissection. Vertebral arteries: Mild narrowing of the origin of the right vertebral artery, secondary to calcified and noncalcified plaque. The right vertebral artery is otherwise patent and dominant. The left vertebral artery is patent from its origin to the vertebrobasilar junction. No evidence of dissection. Skeleton: Complete osseous fusion of the cervical spine, consistent with history of ankylosing spondylitis. No acute osseous abnormality. Other neck: Negative. Upper chest: Negative. Review of the MIP images confirms the above findings CTA  HEAD FINDINGS Anterior circulation: Both internal carotid arteries are patent to the termini, without stenosis or other abnormality. Normal right A1. Hypoplastic or stenotic left A1. Normal anterior communicating artery. Anterior cerebral arteries are patent to their distal aspects. No M1 stenosis or occlusion. Normal MCA bifurcations. Distal MCA branches perfused and symmetric. Posterior circulation: Vertebral arteries patent to the vertebrobasilar junction without stenosis. Posterior inferior cerebral arteries patent bilaterally. Basilar patent to its distal aspect but mildly irregular. Superior cerebellar arteries patent bilaterally. PCAs perfused to their distal aspects without stenosis. Fetal origin of the right PCA, with a patent right posterior communicating artery. The left posterior communicating artery is not visualized. Venous sinuses: As permitted by contrast timing, patent. Anatomic variants: None significant Review of the MIP images confirms the above findings IMPRESSION: 1. Approximately 70% stenosis of the proximal left ICA. No other hemodynamically significant stenosis in the neck. 2. No intracranial large vessel occlusion or significant stenosis. Electronically Signed   By: Merilyn Baba M.D.   On: 03/27/2021 23:54   CT HEAD WO CONTRAST (5MM)  Result Date: 03/27/2021 CLINICAL DATA:  Slurred speech, generalized weakness EXAM: CT HEAD WITHOUT CONTRAST TECHNIQUE: Contiguous axial images were obtained from the base of the skull through the vertex without intravenous contrast. COMPARISON:  None.  FINDINGS: Brain: No evidence of acute infarction, hemorrhage, hydrocephalus, extra-axial collection or mass lesion/mass effect. Global cortical atrophy. Subcortical white matter and periventricular small vessel ischemic changes. Vascular: No hyperdense vessel or unexpected calcification. Skull: Normal. Negative for fracture or focal lesion. Sinuses/Orbits: The visualized paranasal sinuses are essentially  clear. The mastoid air cells are unopacified. Other: None. IMPRESSION: No evidence of acute intracranial abnormality. Atrophy with small vessel ischemic changes. Electronically Signed   By: Julian Hy M.D.   On: 03/27/2021 19:48   MR BRAIN WO CONTRAST  Result Date: 03/28/2021 CLINICAL DATA:  Transient ischemic attack, difficulty speaking EXAM: MRI HEAD WITHOUT CONTRAST TECHNIQUE: Multiplanar, multiecho pulse sequences of the brain and surrounding structures were obtained without intravenous contrast. COMPARISON:  No prior MRI, correlation is made with CT head and CTA head and neck FINDINGS: Evaluation is somewhat limited by motion artifact. Brain: Punctate focus of mild restricted diffusion in the posterior left frontal lobe (series 13, image 94), which may represent an acute or subacute infarct. No acute hemorrhage, mass, mass effect, or midline shift. T2 hyperintense signal in the periventricular white matter, likely the sequela of chronic small vessel ischemic disease. Global cerebral atrophy, not unexpected for age, which is slightly more pronounced in the parietal lobes bilaterally. No foci of hemosiderin deposition to suggest remote hemorrhage. Vascular: Normal flow voids. Skull and upper cervical spine: Osseous fusion of C1, C2, and C3, consistent with the patient's history of ankylosing spondylitis. Otherwise normal marrow signal. Sinuses/Orbits: Negative.  Status post bilateral lens replacements. Other: None. IMPRESSION: Evaluation is somewhat limited by motion artifact. Within this limitation, there is a punctate focus of restricted diffusion in the posterior left frontal lobe, which may represent acute or subacute infarct. Electronically Signed   By: Merilyn Baba M.D.   On: 03/28/2021 01:08    Scheduled Meds:    aspirin EC  325 mg Oral Daily   citalopram  20 mg Oral Daily   [START ON 03/29/2021] clopidogrel  75 mg Oral Daily   rosuvastatin  10 mg Oral QHS   sodium chloride flush  3 mL  Intravenous Q12H    Continuous Infusions:    sodium chloride       LOS: 0 days     Vernell Leep, MD,  FACP, [facility], [facility], [facility] (Care Management Physician Certified) [place]  To contact the attending provider between 7A-7P or the covering provider during after hours 7P-7A, please log into the web site [email or web address] and access using universal [place] password for that web site. If you do not have the password, please call the hospital operator.  03/28/2021, 2:54 PM

## 2021-03-28 NOTE — TOC Initial Note (Signed)
Transition of Care [facility]) - Initial/Assessment Note    Patient Details  Name: Steven [name] MRN: [id number] Date of Birth: 1934/12/19  [facility] CM/SW Contact:    Pollie Friar, RN Phone Number: 03/28/2021, 4:36 PM  Clinical Narrative:                 Patient is from home with spouse. He states she is with him most of the time.  Pt doesn't use any DME at home. He denies any issues with home medications. Pt prefers to attend outpatient therapy at [facility]. CM will fax his orders to the rehab.  TOC following.  Expected Discharge Plan: OP Rehab Barriers to Discharge: Continued Medical Work up   Patient Goals and CMS Choice     Choice offered to / list presented to : Patient  Expected Discharge Plan and Services Expected Discharge Plan: OP Rehab   Discharge Planning Services: CM Consult   Living arrangements for the past 2 months: Single Family Home                                      Prior Living Arrangements/Services Living arrangements for the past 2 months: Single Family Home Lives with:: Spouse Patient language and need for interpreter reviewed:: Yes Do you feel safe going back to the place where you live?: Yes            Criminal Activity/Legal Involvement Pertinent to Current Situation/Hospitalization: No - Comment as needed  Activities of Daily Living Home Assistive Devices/Equipment: None ADL Screening (condition at time of admission) Patient's cognitive ability adequate to safely complete daily activities?: No Is the patient deaf or have difficulty hearing?: No Does the patient have difficulty seeing, even when wearing glasses/contacts?: No Does the patient have difficulty concentrating, remembering, or making decisions?: No Patient able to express need for assistance with ADLs?: Yes Does the patient have difficulty dressing or bathing?: No Independently performs ADLs?: Yes (appropriate for developmental age) Does the patient  have difficulty walking or climbing stairs?: No Weakness of Legs: Right Weakness of Arms/Hands: Right  Permission Sought/Granted                  Emotional Assessment Appearance:: Appears stated age Attitude/Demeanor/Rapport: Engaged Affect (typically observed): Accepting Orientation: : Oriented to Self, Oriented to Place, Oriented to  Time, Oriented to Situation   Psych Involvement: No (comment)  Admission diagnosis:  TIA (transient ischemic attack) [G45.9] CVA (cerebral vascular accident) 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

## 2021-03-28 NOTE — Consult Note (Addendum)
[facility] NOTE   MRN : [id number]  Reason for Consult: TIA with aphasia Referring Physician: Hospitalitis  History of Present Illness: 86 y/o male that has been followed in the past by Dr. Donnetta Hutching and Dr. Donzetta Matters.  He was last seen 08/25/19 with < 39% stenosis B ICA stenosis and was asymptomatic.     He presented to [facility] with garbled speech and a feeling of not being balanced.  MRI of brain to rule out acute ischemic CVA.  Carotid duplex revealed left ICA of 70%.  His symptoms have resolved.  He denise history of CVA/TIA.     Past medical history: HLD, ankylosing spodilitis   Current Facility-Administered Medications  Medication Dose Route Frequency Provider Last Rate Last Admin   0.9 %  sodium chloride infusion  250 mL Intravenous PRN Chotiner, Yevonne Aline, MD       acetaminophen (TYLENOL) tablet 650 mg  650 mg Oral Q4H PRN Chotiner, Yevonne Aline, MD   650 mg at 03/28/21 0205   Or   acetaminophen (TYLENOL) 160 MG/5ML solution 650 mg  650 mg Per Tube Q4H PRN Chotiner, Yevonne Aline, MD       Or   acetaminophen (TYLENOL) suppository 650 mg  650 mg Rectal Q4H PRN Chotiner, Yevonne Aline, MD       aspirin EC tablet 325 mg  325 mg Oral Daily Rosalin Hawking, MD       citalopram (CELEXA) tablet 20 mg  20 mg Oral Daily Chotiner, Yevonne Aline, MD       clopidogrel (PLAVIX) tablet 300 mg  300 mg Oral Once Rosalin Hawking, MD       Followed by   Derrill Memo ON 03/29/2021] clopidogrel (PLAVIX) tablet 75 mg  75 mg Oral Daily Rosalin Hawking, MD       polyethylene glycol (MIRALAX / GLYCOLAX) packet 17 g  17 g Oral Daily PRN Chotiner, Yevonne Aline, MD       rosuvastatin (CRESTOR) tablet 10 mg  10 mg Oral QHS Chotiner, Yevonne Aline, MD   10 mg at 03/27/21 2159   sodium chloride flush (NS) 0.9 % injection 3 mL  3 mL Intravenous Q12H Chotiner, Yevonne Aline, MD   3 mL at 03/27/21 2159   sodium chloride flush (NS) 0.9 % injection 3 mL  3 mL Intravenous PRN Chotiner, Yevonne Aline, MD        Pt meds  include: Statin :Yes Betablocker: No ASA: No Other anticoagulants/antiplatelets: Plavix has been started, ASA 325 mg,   Past Medical History:  Diagnosis Date   Allergy    enviromental   Anemia 2017   Anxiety    Arthritis    ankylosing spodilitis   Blood transfusion without reported diagnosis    during surgery   Cataract    Depression    Dyspnea    with exertion - had Echo done 09/29/16   History of kidney stones    Hyperlipidemia    Intestinal obstruction ([place])    Pneumonia    as a child    Past Surgical History:  Procedure Laterality Date   ABDOMINAL SURGERY     had abcess   APPENDECTOMY     CHOLECYSTECTOMY     with lysis of adhesions   COLONOSCOPY     COLOSTOMY     COLOSTOMY REVERSAL     EYE SURGERY Left    scar tissue removed from cornea   EYE SURGERY Right    cataract surgery with lens implant  JOINT REPLACEMENT Right    hip  x 2 1999 and 2007   SHOULDER ARTHROSCOPY WITH ROTATOR CUFF REPAIR AND SUBACROMIAL DECOMPRESSION Left 03/02/2013   Procedure: LEFT SHOULDER ARTHROSCOPY WITH SUBACROMIAL DECOMPRESSION DISTAL CALVICLE RESECTION AND ROTATOR CUFF REPAIR ;  Surgeon: [name], MD;  Location: [facility];  Service: Orthopedics;  Laterality: Left;   TOTAL HIP REVISION Right 11/06/2016   Procedure: TOTAL HIP REVISION OF THE ACETABULAR COMPONENT;  Surgeon: [name], MD;  Location: [facility];  Service: Orthopedics;  Laterality: Right;    Social History Social History   Tobacco Use   Smoking status: Former   Smokeless tobacco: Never  Scientific laboratory technician Use: Never used  Substance Use Topics   Alcohol use: Yes    Comment: 3 beers or glasses of wine a day--reports stopped ETOH 11/01/16    Drug use: No    Family History Family History  Problem Relation Age of Onset   Heart attack Mother    Diabetes Mother    Breast cancer Mother    Heart attack Maternal Grandfather    Pancreatic cancer Brother    Colon cancer Neg Hx    Esophageal cancer Neg Hx     Stomach cancer Neg Hx     Allergies  Allergen Reactions   Other Hives   Indomethacin Hives and Other (See Comments)   Lactose Intolerance (Gi)     GI UPSET     REVIEW OF SYSTEMS  General: [ ]  Weight loss, [ ]  Fever, [ ]  chills Neurologic: [ ]  Dizziness, [ ]  Blackouts, [ ]  Seizure [ ]  Stroke, [ ]  "Mini stroke", [ ]  Slurred speech, [ ]  Temporary blindness; [ ]  weakness in arms or legs, [ ]  Hoarseness [ ]  Dysphagia Cardiac: [ ]  Chest pain/pressure, [ ]  Shortness of breath at rest [ ]  Shortness of breath with exertion, [ ]  Atrial fibrillation or irregular heartbeat  Vascular: [ ]  Pain in legs with walking, [ ]  Pain in legs at rest, [ ]  Pain in legs at night,  [ ]  Non-healing ulcer, [ ]  Blood clot in vein/DVT,   Pulmonary: [ ]  Home oxygen, [ ]  Productive cough, [ ]  Coughing up blood, [ ]  Asthma,  [ ]  Wheezing [ ]  COPD Musculoskeletal:  [ ]  Arthritis, [ ]  Low back pain, [ ]  Joint pain Hematologic: [ ]  Easy Bruising, [x ] Anemia; [ ]  Hepatitis Gastrointestinal: [ ]  Blood in stool, [ ]  Gastroesophageal Reflux/heartburn, Urinary: [ ]  chronic Kidney disease, [ ]  on HD - [ ]  MWF or [ ]  TTHS, [ ]  Burning with urination, [ ]  Difficulty urinating Skin: [ ]  Rashes, [ ]  Wounds Psychological: [ x] Anxiety, [ ]  Depression  Physical Examination Vitals:   03/28/21 0544 03/28/21 0622 03/28/21 0625 03/28/21 0822  BP: 97/60  (!) 109/53 116/62  Pulse:   60 60  Resp: 16 11  16   Temp: 99.3 F (37.4 C)  98.5 F (36.9 C) 98.7 F (37.1 C)  TempSrc:   Oral Oral  SpO2:   98% 98%  Weight:      Height:       Body mass index is 22.05 kg/m.  General:  WDWN in NAD HENT: WNL Eyes: Pupils equal Pulmonary: normal non-labored breathing , without Rales, rhonchi,  wheezing Cardiac: RRR, without  Murmurs, rubs or gallops; No carotid bruits Abdomen: soft, NT, no masses Skin: no rashes, ulcers noted;  no Gangrene , no cellulitis; no open wounds;   Vascular Exam/Pulses:Palpable  radial and pedal  pulses   Musculoskeletal: no muscle wasting or atrophy; no edema  Neurologic: A&O X 3; Appropriate Affect ;  SENSATION: normal; MOTOR FUNCTION: 5/5 Symmetric Speech is fluent/normal   Significant Diagnostic Studies: CBC Lab Results  Component Value Date   WBC 6.4 03/27/2021   HGB 11.8 (L) 03/27/2021   HCT 35.3 (L) 03/27/2021   MCV 101.1 (H) 03/27/2021   PLT 228 03/27/2021    BMET    Component Value Date/Time   NA 136 03/27/2021 1909   NA 141 04/20/2012 0425   K 3.9 03/27/2021 1909   K 4.1 04/20/2012 0425   CL 101 03/27/2021 1909   CL 109 (H) 04/20/2012 0425   CO2 27 03/27/2021 1909   CO2 28 04/20/2012 0425   GLUCOSE 126 (H) 03/27/2021 1909   GLUCOSE 144 (H) 04/20/2012 0425   BUN 11 03/27/2021 1909   BUN 14 04/20/2012 0425   CREATININE 0.75 03/27/2021 1909   CREATININE 0.77 03/27/2021 1248   CREATININE 0.95 04/20/2012 0425   CALCIUM 9.0 03/27/2021 1909   CALCIUM 8.2 (L) 04/20/2012 0425   GFRNONAA >60 03/27/2021 1909   GFRNONAA >60 03/27/2021 1248   GFRNONAA >60 04/20/2012 0425   GFRAA >60 11/16/2019 1407   GFRAA >60 04/20/2012 0425   Estimated Creatinine Clearance: 64 mL/min (by C-G formula based on SCr of 0.75 mg/dL).  COAG Lab Results  Component Value Date   INR 1.1 03/27/2021   INR 1.1 03/10/2019   INR 1.00 11/03/2016     Non-Invasive Vascular Imaging:  CTA neck Right carotid system: No evidence of dissection, stenosis (50% or greater) or occlusion. Calcifications in the bifurcation and in the proximal right ICA, which are not hemodynamically significant.   Left carotid system: Approximately 70% stenosis of the proximal left ICA, secondary to calcified and noncalcified plaque (series 6, image 174). No evidence of dissection.  MRI IMPRESSION: Evaluation is somewhat limited by motion artifact. Within this limitation, there is a punctate focus of restricted diffusion in the posterior left frontal lobe, which may represent acute or  subacute infarct.  ASSESSMENT/PLAN:  TIA with aphasia symptoms that have resolved CTA shows 70% stenosis left ICA He will need left CEA verse TCAR pending Dr. Osie Cheeks review and exam.  If he is stable we may be able to schedule this as an OP. Continue maximum medical care with ASA, Plavix and Statin.  Pending further work echo today.    Roxy Horseman 03/28/2021 8:36 AM   VASCULAR STAFF ADDENDUM: I have independently interviewed and examined the patient. I agree with the above.  Doing well on exam No focal deficits, speech deficit resolved Imaging reviewed. Pt with symptomatic L ICA stenosis.   NASCET literature suggests a reduction from 26% to 9% with cerebrovascular revascularization, as opposed to medical therapy alone. Patient has anatomy suitable for transcarotid artery revascularization. I discussed the risks and benefits of TCAR with Fisher, and he elected to proceed. Plan to do his case Monday.  If he could stay through the weekend this would provide the best continuity in care as he will likely require a 1 hospital night stay post procedure. I will see him again Sunday to answer any questions and preop him for surgery  Please continue ASA and PLavix  Cassandria Santee, MD [facility] Phone Number: [phone] 03/28/2021 7:22 PM

## 2021-03-28 NOTE — Progress Notes (Addendum)
STROKE TEAM PROGRESS NOTE   ATTENDING NOTE: I reviewed above note and agree with the assessment and plan. Pt was seen and examined.   86 year old male with history of hyperlipidemia on Crestor admitted for episode of expressive aphasia lasting 7 to 8 hours.  CT no acute abnormality.  CTA head and neck showed left ICA 70% stenosis.  MRI showed left frontal small/punctate infarct.  Carotid Doppler 08/2019 unremarkable.  2D echo pending.  UDS negative, LDL 66, A1c 6.4.  Creatinine 0.75.  On exam, patient sitting in chair, neurologically intact, no focal deficits.  However BP soft, 90s-100s, patient denies any dizziness or lightheadedness.  We will put patient back to bed, IVF bolus, and close monitoring BP.  BP goal 1 20-1 60 before carotid revascularization.  May consider midodrine before procedure if needed.  Discussed with Dr. Algis Liming  Etiology for patient stroke likely due to left ICA stenosis, loaded with aspirin and Plavix, continue DAPT.  Continue Crestor 10.  Vascular surgery consultation requested, Dr. Shirlean Mylar will decide on TCAR versus CEA.  We will follow.  For detailed assessment and plan, please refer to above as I have made changes wherever appropriate.   Rosalin Hawking, MD PhD Stroke Neurology 03/28/2021 5:39 PM    INTERVAL HISTORY Current BP systolic 41D-408X, encourage fluids and 500cc NS bolus ordered. Patient has no residual deficits that he can notice. He does report that over the last 6 months he has had trouble verbalizing/explaining what he is thinking. We discussed the possibility of a vascular procedure next week for his Left ICA stenosis. Start DAPT with plavix and aspirin today. Load 300mg  plavix then 75mg .   Vitals:   03/28/21 0544 03/28/21 0622 03/28/21 0625 03/28/21 0822  BP: 97/60  (!) 109/53 116/62  Pulse:   60 60  Resp: 16 11  16   Temp: 99.3 F (37.4 C)  98.5 F (36.9 C) 98.7 F (37.1 C)  TempSrc:   Oral Oral  SpO2:   98% 98%  Weight:      Height:        CBC:  Recent Labs  Lab 03/27/21 1248 03/27/21 1909  WBC 6.5 6.4  NEUTROABS 5.1 5.0  HGB 11.3* 11.8*  HCT 33.8* 35.3*  MCV 98.5 101.1*  PLT 234 448   Basic Metabolic Panel:  Recent Labs  Lab 03/27/21 1248 03/27/21 1909  NA 138 136  K 4.0 3.9  CL 103 101  CO2 27 27  GLUCOSE 106* 126*  BUN 13 11  CREATININE 0.77 0.75  CALCIUM 9.0 9.0   Lipid Panel:  Recent Labs  Lab 03/28/21 0211  CHOL 150  TRIG 171*  HDL 50  CHOLHDL 3.0  VLDL 34  LDLCALC 66   HgbA1c:  Recent Labs  Lab 03/28/21 0211  HGBA1C 6.4*   Urine Drug Screen:  Recent Labs  Lab 03/27/21 1909  LABOPIA NONE DETECTED  COCAINSCRNUR NONE DETECTED  LABBENZ NONE DETECTED  AMPHETMU NONE DETECTED  THCU NONE DETECTED  LABBARB NONE DETECTED    Alcohol Level  Recent Labs  Lab 03/27/21 1857  ETH <10    IMAGING past 24 hours CT ANGIO HEAD NECK W WO CM  Result Date: 03/27/2021 CLINICAL DATA:  Stroke suspected, aphasia, slurred speech EXAM: CT ANGIOGRAPHY HEAD AND NECK TECHNIQUE: Multidetector CT imaging of the head and neck was performed using the standard protocol during bolus administration of intravenous contrast. Multiplanar CT image reconstructions and MIPs were obtained to evaluate the vascular anatomy. Carotid stenosis measurements (when applicable)  are obtained utilizing NASCET criteria, using the distal internal carotid diameter as the denominator. CONTRAST:  41mL OMNIPAQUE IOHEXOL 350 MG/ML SOLN COMPARISON:  CT head 03/27/2021.  No prior CTA. FINDINGS: CT HEAD FINDINGS For noncontrast findings, please see same day CT head. CTA NECK FINDINGS Aortic arch: Standard branching. Imaged portion shows no evidence of aneurysm or dissection. No significant stenosis of the major arch vessel origins. Aortic atherosclerosis. Right carotid system: No evidence of dissection, stenosis (50% or greater) or occlusion. Calcifications in the bifurcation and in the proximal right ICA, which are not hemodynamically  significant. Left carotid system: Approximately 70% stenosis of the proximal left ICA, secondary to calcified and noncalcified plaque (series 6, image 174). No evidence of dissection. Vertebral arteries: Mild narrowing of the origin of the right vertebral artery, secondary to calcified and noncalcified plaque. The right vertebral artery is otherwise patent and dominant. The left vertebral artery is patent from its origin to the vertebrobasilar junction. No evidence of dissection. Skeleton: Complete osseous fusion of the cervical spine, consistent with history of ankylosing spondylitis. No acute osseous abnormality. Other neck: Negative. Upper chest: Negative. Review of the MIP images confirms the above findings CTA HEAD FINDINGS Anterior circulation: Both internal carotid arteries are patent to the termini, without stenosis or other abnormality. Normal right A1. Hypoplastic or stenotic left A1. Normal anterior communicating artery. Anterior cerebral arteries are patent to their distal aspects. No M1 stenosis or occlusion. Normal MCA bifurcations. Distal MCA branches perfused and symmetric. Posterior circulation: Vertebral arteries patent to the vertebrobasilar junction without stenosis. Posterior inferior cerebral arteries patent bilaterally. Basilar patent to its distal aspect but mildly irregular. Superior cerebellar arteries patent bilaterally. PCAs perfused to their distal aspects without stenosis. Fetal origin of the right PCA, with a patent right posterior communicating artery. The left posterior communicating artery is not visualized. Venous sinuses: As permitted by contrast timing, patent. Anatomic variants: None significant Review of the MIP images confirms the above findings IMPRESSION: 1. Approximately 70% stenosis of the proximal left ICA. No other hemodynamically significant stenosis in the neck. 2. No intracranial large vessel occlusion or significant stenosis. Electronically Signed   By: Merilyn Baba  M.D.   On: 03/27/2021 23:54   CT HEAD WO CONTRAST (5MM)  Result Date: 03/27/2021 CLINICAL DATA:  Slurred speech, generalized weakness EXAM: CT HEAD WITHOUT CONTRAST TECHNIQUE: Contiguous axial images were obtained from the base of the skull through the vertex without intravenous contrast. COMPARISON:  None. FINDINGS: Brain: No evidence of acute infarction, hemorrhage, hydrocephalus, extra-axial collection or mass lesion/mass effect. Global cortical atrophy. Subcortical white matter and periventricular small vessel ischemic changes. Vascular: No hyperdense vessel or unexpected calcification. Skull: Normal. Negative for fracture or focal lesion. Sinuses/Orbits: The visualized paranasal sinuses are essentially clear. The mastoid air cells are unopacified. Other: None. IMPRESSION: No evidence of acute intracranial abnormality. Atrophy with small vessel ischemic changes. Electronically Signed   By: Julian Hy M.D.   On: 03/27/2021 19:48   MR BRAIN WO CONTRAST  Result Date: 03/28/2021 CLINICAL DATA:  Transient ischemic attack, difficulty speaking EXAM: MRI HEAD WITHOUT CONTRAST TECHNIQUE: Multiplanar, multiecho pulse sequences of the brain and surrounding structures were obtained without intravenous contrast. COMPARISON:  No prior MRI, correlation is made with CT head and CTA head and neck FINDINGS: Evaluation is somewhat limited by motion artifact. Brain: Punctate focus of mild restricted diffusion in the posterior left frontal lobe (series 13, image 94), which may represent an acute or subacute infarct. No acute hemorrhage, mass,  mass effect, or midline shift. T2 hyperintense signal in the periventricular white matter, likely the sequela of chronic small vessel ischemic disease. Global cerebral atrophy, not unexpected for age, which is slightly more pronounced in the parietal lobes bilaterally. No foci of hemosiderin deposition to suggest remote hemorrhage. Vascular: Normal flow voids. Skull and upper  cervical spine: Osseous fusion of C1, C2, and C3, consistent with the patient's history of ankylosing spondylitis. Otherwise normal marrow signal. Sinuses/Orbits: Negative.  Status post bilateral lens replacements. Other: None. IMPRESSION: Evaluation is somewhat limited by motion artifact. Within this limitation, there is a punctate focus of restricted diffusion in the posterior left frontal lobe, which may represent acute or subacute infarct. Electronically Signed   By: Merilyn Baba M.D.   On: 03/28/2021 01:08    PHYSICAL EXAM  Temp:  [98.1 F (36.7 C)-99.6 F (37.6 C)] 98.7 F (37.1 C) (12/16 0822) Pulse Rate:  [60-79] 60 (12/16 0822) Resp:  [11-26] 16 (12/16 0822) BP: (97-166)/(53-134) 116/62 (12/16 0822) SpO2:  [95 %-100 %] 98 % (12/16 0822) Weight:  [65.8 kg-66.2 kg] 65.8 kg (12/15 1857)  General - Well nourished, well developed, in no apparent distress. Ophthalmologic - fundi not visualized due to noncooperation. Cardiovascular - Regular rhythm and rate.  Mental Status -  Level of arousal and orientation to time, place, and person were intact. Language including expression, naming, repetition, comprehension was assessed and found intact. Attention span and concentration were normal. Recent and remote memory were intact. Fund of Knowledge was assessed and was intact.  Cranial Nerves II - XII - II - Visual field intact OU. III, IV, VI - Extraocular movements intact. V - Facial sensation intact bilaterally. VII - Facial movement intact bilaterally. VIII - Hearing & vestibular intact bilaterally. X - Palate elevates symmetrically. XI - Chin turning & shoulder shrug intact bilaterally. XII - Tongue protrusion intact.  Motor Strength - The patients strength was normal in all extremities and pronator drift was absent.  Bulk was normal and fasciculations were absent.   Motor Tone - Muscle tone was assessed at the neck and appendages and was normal. Reflexes - The patients  reflexes were symmetrical in all extremities and he had no pathological reflexes. Sensory - Light touch, temperature/pinprick were assessed and were symmetrical.   Coordination - The patient had normal movements in the hands and feet with no ataxia or dysmetria.  Tremor was absent. Gait and Station - deferred.   ASSESSMENT/PLAN Steven [name] is a 86 y.o. male with history of ankylosis spondylitis, iron deficiency anemia presenting with difficulty with his speech. He initially went to a doctors appointment with Dr. Lorenso Courier on 12/15 in his usual state of health. Around lunch time, he went to get lunch and noticed his speech was gibberish at checkout. He noticed some confusion but he was able to pay for his food. He then went to a hardware store afterwards where no one could understand him, he knew he needed salt for his water softener, but could not verbalize it. His wife came home around 7pm and found that his speech was still gibberish so she called EMS. His symptoms improved when EMS arrived.   Stroke:  left punctate infarct posterior left frontal lobe likely secondary small vessel disease source and left ICA stenosis  Code Stroke CT head Small vessel disease. Atrophy CTA head & neck 70% stenosis of proximal left ICA MRI  acute or subacute punctate infarct in posterior left frontal lobe 2D Echo pending LDL 66  HgbA1c 6.4 VTE prophylaxis - SCDs No antithrombotic prior to admission, now on aspirin 325 mg daily and clopidogrel 75 mg daily.  Therapy recommendations:  possible outpatient PT vs none pending progress Disposition:  pending   Hypotension BP goal systolic 462 NS bolus [id number] Encourage fluids Consider midodrine until procedure if BP does not improve with fluids  Left ICA stenosis Vascular surgery consult Avoid hypotension- encourage fluids  Hyperlipidemia Home meds:  Crestor 10mg , resumed in hospital LDL 66, goal < 70 Continue statin at discharge  Other Stroke Risk  Factors Advanced Age >/= 65  ETOH use, alcohol level <10, advised to drink no more than 1-2 drink(s) a day Coronary artery disease Migraines Obstructive sleep apnea, on CPAP at home Congestive heart failure  Other Active Problems Ankylosising spondolitis Iron deficiency anemia  Hospital day # 0  Patient seen and examined by NP/APP with MD. MD to update note as needed.   Steven Ores, DNP, FNP-BC [facility] Pager: [phone]   To contact Stroke Continuity provider, please refer to [email or web address] After hours, contact General Neurology

## 2021-03-28 NOTE — Evaluation (Signed)
Occupational Therapy Evaluation Patient Details Name: Steven [name] MRN: [id number] DOB: [birth year] Today's Date: 03/28/2021   History of Present Illness Steven [name] is a 86 y.o. male with medical history significant for HLD, ankylosing spodilitis, hx of SBO due to adhesions with colostomy then reversal, R THA x2, L shoulder DCR, RCR admitted due to transiet aphasia. Undergoing work up for possible stroke and for possible carotid endarterectomy. MRI of brain showed:punctate focus of restricted diffusion in the  posterior left frontal lobe, which may represent acute or subacute  infarct   Clinical Impression   Patient evaluated by Occupational Therapy with no further acute OT needs identified. All education has been completed and the patient has no further questions. Pt appears to be at, or close to his baseline from an OT standpoint.  See below for any follow-up Occupational Therapy or equipment needs. OT is signing off. Thank you for this referral.       Recommendations for follow up therapy are one component of a multi-disciplinary discharge planning process, led by the attending physician.  Recommendations may be updated based on patient status, additional functional criteria and insurance authorization.   Follow Up Recommendations  No OT follow up    Assistance Recommended at Discharge Intermittent Supervision/Assistance  Functional Status Assessment  Patient has not had a recent decline in their functional status  Equipment Recommendations  None recommended by OT    Recommendations for Other Services       Precautions / Restrictions Precautions Precautions: Fall      Mobility Bed Mobility Overal bed mobility: Modified Independent             General bed mobility comments: increased time    Transfers Overall transfer level: Needs assistance Equipment used: None Transfers: Sit to/from Stand;Bed to chair/wheelchair/BSC Sit to Stand: Min guard Stand pivot  transfers: Min guard         General transfer comment: assist for balance      Balance Overall balance assessment: Needs assistance   Sitting balance-[name]: Good Sitting balance - Comments: stiffness more than balance hindering pt pulling up socks in sitting, but completed toilet hygiene on his own in bathroom   Standing balance support: During functional activity;No upper extremity supported;Single extremity supported Standing balance-[name]: Fair Standing balance comment: standing at sink to brush teeth, wash hands                           ADL either performed or assessed with clinical judgement   ADL Overall ADL's : At baseline                                       General ADL Comments: Pt is mildly unstable, but is able to complete ADLs without assistance.  He reports he feels he is at baseline     Vision Baseline Vision/History: 0 No visual deficits Patient Visual Report: No change from baseline Vision Assessment?: No apparent visual deficits     Perception     Praxis      Pertinent Vitals/Pain Pain Assessment: No/denies pain     Hand Dominance Right   Extremity/Trunk Assessment Upper Extremity Assessment Upper Extremity Assessment: Overall WFL for tasks assessed   Lower Extremity Assessment Lower Extremity Assessment: Defer to PT evaluation   Cervical / Trunk Assessment Cervical / Trunk Assessment: Kyphotic Cervical / Trunk  Exceptions: capital extension and cervical flexion noted   Communication Communication Communication: No difficulties   Cognition Arousal/Alertness: Awake/alert Behavior During Therapy: WFL for tasks assessed/performed Overall Cognitive Status: Within Functional Limits for tasks assessed                                 General Comments: Pt able to follow 3 step command without difficulty.  he performed path finding using signage independently     General Comments  VSS.  discussed recommendation that he sit to shower with him and he verbally agreed    Exercises     Shoulder Instructions      Home Living Family/patient expects to be discharged to:: Private residence Living Arrangements: Spouse/significant other Available Help at Discharge: Family;Available 24 hours/day Type of Home: House Home Access: Stairs to enter   Entrance Stairs-Rails: Left;Right;Can reach both Home Layout: Two level Alternate Level Stairs-Number of Steps: 13 Alternate Level Stairs-Rails: Right;Left;Can reach both Bathroom Shower/Tub: Occupational psychologist: Handicapped height Bathroom Accessibility: Yes   Home Equipment: Shower seat;Grab bars - tub/shower;Rolling Environmental consultant (2 wheels);Cane - single point          Prior Functioning/Environment Prior Level of Function : Independent/Modified Independent             Mobility Comments: denies falls in past 6 months ADLs Comments: independent and helps with dishwashing, etc, driving.  retired from [employer] as an Engineer, civil (consulting) Problem List: Impaired balance (sitting and/or standing)      OT Treatment/Interventions:      OT Goals(Current goals can be found in the care plan section) Acute Rehab OT Goals Patient Stated Goal: pt did not state OT Goal Formulation: All assessment and education complete, DC therapy  OT Frequency:     Barriers to D/C:            Co-evaluation              AM-PAC OT "6 Clicks" Daily Activity     Outcome Measure Help from another person eating meals?: None Help from another person taking care of personal grooming?: None Help from another person toileting, which includes using toliet, bedpan, or urinal?: A Little Help from another person bathing (including washing, rinsing, drying)?: A Little Help from another person to put on and taking off regular upper body clothing?: A Little Help from another person to put on and taking off regular lower body clothing?: A  Little 6 Click Score: 20   End of Session Nurse Communication: Mobility status  Activity Tolerance: Patient tolerated treatment well Patient left: in chair;with call bell/phone within reach  OT Visit Diagnosis: Unsteadiness on feet (R26.81)                Time: 6269-4854 OT Time Calculation (min): 21 min Charges:  OT General Charges $OT Visit: 1 Visit OT Evaluation $OT Eval Low Complexity: 1 Low  Nilsa Nutting., OTR/L Acute Rehabilitation Services Pager [phone] Office [phone]   Lucille Passy M 03/28/2021, 2:27 PM

## 2021-03-29 DIAGNOSIS — I63232 Cerebral infarction due to unspecified occlusion or stenosis of left carotid arteries: Secondary | ICD-10-CM

## 2021-03-29 MED ORDER — MIDODRINE HCL 5 MG PO TABS
10.0000 mg | ORAL_TABLET | Freq: Three times a day (TID) | ORAL | Status: DC
  Administered 2021-03-29 – 2021-03-30 (×4): 10 mg via ORAL
  Filled 2021-03-29 (×4): qty 2

## 2021-03-29 MED ORDER — MIDODRINE HCL 5 MG PO TABS
5.0000 mg | ORAL_TABLET | Freq: Three times a day (TID) | ORAL | Status: DC
Start: 2021-03-29 — End: 2021-03-29
  Administered 2021-03-29: 5 mg via ORAL
  Filled 2021-03-29: qty 1

## 2021-03-29 MED ORDER — SODIUM CHLORIDE 0.9 % IV SOLN: INTRAVENOUS | Status: DC

## 2021-03-29 MED ORDER — SODIUM CHLORIDE 0.9 % IV BOLUS
500.0000 mL | Freq: Once | INTRAVENOUS | Status: AC
  Administered 2021-03-29: 500 mL via INTRAVENOUS

## 2021-03-29 NOTE — Progress Notes (Addendum)
PROGRESS NOTE   NOHA KARASIK  UKG:[id number]    DOB: [birth year]    DOA: 03/27/2021  PCP: [name], MD   I have briefly reviewed patients previous medical records in [facility].  Chief complaint: Speech difficulty   Brief Narrative:  86 year old male with medical history significant for hyperlipidemia, ankylosing spondylitis, anxiety and depression, presented to ED on 03/27/2021 with speech difficulty which lasted for approximately 6 hours on day of admission.  Admitted for acute ischemic stroke suspected due to symptomatic left ICA stenosis.  Neurology/stroke MD and vascular surgery were consulted.  Stroke work-up was completed.  Vascular surgery plans TCAR (plans carotid artery revascularization) on 03/31/2021 and they prefer that patient remain in hospital for this procedure.   Assessment & Plan:  Principal Problem:   TIA (transient ischemic attack) Active Problems:   Hyperlipidemia   History of ankylosing spondylitis   CVA (cerebral vascular accident) ([place])   Stenosis of left carotid artery   Acute ischemic stroke (posterior left frontal lobe) secondary to symptomatic left ICA stenosis: -CT head 12/15: No acute intracranial abnormality. -MRI brain: Punctate focus of restricted diffusion in the posterior left frontal lobe, which may represent acute or subacute infarct. -CTA head and neck: Approximately 70% stenosis of the proximal left ICA.  No other hemodynamically significant stenosis in the neck.  No intracranial large vessel occlusion or significant stenosis. -2D echo: LVEF 60 to 65%.  No aortic stenosis is present. -A1c 6.4.  LDL 66. -PT recommends outpatient PT versus no follow-up.  OT and SLP have no follow-up needs -Not on antiplatelets PTA.  Now on DAPT with aspirin 325 Mg daily + Plavix 75 Mg daily.  Continue statins. -Neurology consultation appreciated.   - Vascular surgery consulted for management of left ICA stenosis.  Symptomatic left ICA  stenosis -Vascular surgery consultation appreciated and plan TCAR on 12/19 and recommend that patient stay through the weekend for the procedure.  Hypotension -Patient has had asymptomatic hypotension with SBP's in the 90-100 range since admission despite IV fluid boluses.  Received IV fluid bolus this morning with transient improvement in BP which again dropped down to SBP of 90 -Thereby started midodrine 5 Mg 3 times daily, can be titrated up as needed.  As per stroke MD, BP goal 120-160 before carotid revascularization.  Also asked RN to get patient back into bed in supine position. -Avoid hypotension given left ICA stenosis and risk of worsening stroke.  Hyperlipidemia - Continue Crestor.  Ankylosing spondylitis -On weekly methotrexate and daily folic acid supplements for same.  Anxiety and depression -Does not appear to be on any medications for this.  Macrocytic anemia -May be related to anemia of chronic disease versus other etiology.  Stable.  Outpatient follow-up.  Body mass index is 22.05 kg/m.    DVT prophylaxis: SCD's Start: 03/27/21 2142     Code Status: Full Code Family Communication: None at bedside. Disposition:  Status is: Inpatient     Consultants:   Neurology Vascular surgery  Procedures:   None  Antimicrobials:   None   Subjective:  Patient states that his speech is even better than it was 1 to 2 months ago, clear and has no difficulty finding words.  As per RN, patient was noted to be somewhat unstable when he was standing up at the sink this morning.  Made to sit in a recliner.  Patient denies that, indicates that is not new for him, denies dizziness or lightheadedness, recurrence of speech difficulties or  any other strokelike symptoms.  Objective:   Vitals:   03/29/21 0007 03/29/21 0334 03/29/21 0840 03/29/21 1156  BP: 104/60 (!) 102/52 (!) 123/57 (!) 90/50  Pulse: 61 60 (!) 57 (!) 59  Resp: 18 16  18   Temp: 98.7 F (37.1 C) 98 F (36.7  C) 98.2 F (36.8 C)   TempSrc: Oral Oral    SpO2:  96% 100% 98%  Weight:      Height:        General exam: Elderly male, moderately built and thinly nourished sitting up comfortably in reclining chair without distress. Respiratory system: Clear to auscultation.  No increased work of breathing. Cardiovascular system: S1 and S2 heard, RRR.  No JVD, murmurs or pedal edema.  Telemetry personally reviewed: Sinus rhythm. Gastrointestinal system: Abdomen is nondistended, soft and nontender. No organomegaly or masses felt. Normal bowel sounds heard. Central nervous system: Alert and oriented. No focal neurological deficits.  No dysarthria or aphasia appreciated. Extremities: Symmetric 5 x 5 power. Skin: No rashes, lesions or ulcers Psychiatry: Judgement and insight appear normal. Mood & affect appropriate.     Data Reviewed:   I have personally reviewed following labs and imaging studies   CBC: Recent Labs  Lab 03/27/21 1248 03/27/21 1909  WBC 6.5 6.4  NEUTROABS 5.1 5.0  HGB 11.3* 11.8*  HCT 33.8* 35.3*  MCV 98.5 101.1*  PLT 234 269    Basic Metabolic Panel: Recent Labs  Lab 03/27/21 1248 03/27/21 1909  NA 138 136  K 4.0 3.9  CL 103 101  CO2 27 27  GLUCOSE 106* 126*  BUN 13 11  CREATININE 0.77 0.75  CALCIUM 9.0 9.0    Liver Function Tests: Recent Labs  Lab 03/27/21 1248 03/27/21 1909  AST 18 20  ALT 18 18  ALKPHOS 56 48  BILITOT 0.9 0.6  PROT 7.4 7.1  ALBUMIN 3.8 3.8    CBG: Recent Labs  Lab 03/27/21 1903  GLUCAP 130*    Microbiology Studies:   Recent Results (from the past 240 hour(s))  Resp Panel by RT-PCR (Flu A&B, Covid) Nasopharyngeal Swab     Status: None   Collection Time: 03/27/21  7:09 PM   Specimen: Nasopharyngeal Swab; Nasopharyngeal(NP) swabs in vial transport medium  Result Value Ref Range Status   SARS Coronavirus 2 by RT PCR NEGATIVE NEGATIVE Final    Comment: (NOTE) SARS-CoV-2 target nucleic acids are NOT DETECTED.  The  SARS-CoV-2 RNA is generally detectable in upper respiratory specimens during the acute phase of infection. The lowest concentration of SARS-CoV-2 viral copies this assay can detect is 138 copies/mL. A negative result does not preclude SARS-Cov-2 infection and should not be used as the sole basis for treatment or other patient management decisions. A negative result may occur with  improper specimen collection/handling, submission of specimen other than nasopharyngeal swab, presence of viral mutation(s) within the areas targeted by this assay, and inadequate number of viral copies(<138 copies/mL). A negative result must be combined with clinical observations, patient history, and epidemiological information. The expected result is Negative.  Fact Sheet for Patients:  EntrepreneurPulse.com.au  Fact Sheet for Healthcare Providers:  IncredibleEmployment.be  This test is no t yet approved or cleared by the [place] FDA and  has been authorized for detection and/or diagnosis of SARS-CoV-2 by FDA under an Emergency Use Authorization (EUA). This EUA will remain  in effect (meaning this test can be used) for the duration of the COVID-19 declaration under Section 564(b)(1) of the Act,  21 U.S.C.section 360bbb-3(b)(1), unless the authorization is terminated  or revoked sooner.       Influenza A by PCR NEGATIVE NEGATIVE Final   Influenza B by PCR NEGATIVE NEGATIVE Final    Comment: (NOTE) The Xpert Xpress SARS-CoV-2/FLU/RSV plus assay is intended as an aid in the diagnosis of influenza from Nasopharyngeal swab specimens and should not be used as a sole basis for treatment. Nasal washings and aspirates are unacceptable for Xpert Xpress SARS-CoV-2/FLU/RSV testing.  Fact Sheet for Patients: EntrepreneurPulse.com.au  Fact Sheet for Healthcare Providers: IncredibleEmployment.be  This test is not yet approved or  cleared by the [place] FDA and has been authorized for detection and/or diagnosis of SARS-CoV-2 by FDA under an Emergency Use Authorization (EUA). This EUA will remain in effect (meaning this test can be used) for the duration of the COVID-19 declaration under Section 564(b)(1) of the Act, 21 U.S.C. section 360bbb-3(b)(1), unless the authorization is terminated or revoked.  Performed at [facility], [address] [place], [place] [address]     Radiology Studies:  CT ANGIO HEAD NECK W WO CM  Result Date: 03/27/2021 CLINICAL DATA:  Stroke suspected, aphasia, slurred speech EXAM: CT ANGIOGRAPHY HEAD AND NECK TECHNIQUE: Multidetector CT imaging of the head and neck was performed using the standard protocol during bolus administration of intravenous contrast. Multiplanar CT image reconstructions and MIPs were obtained to evaluate the vascular anatomy. Carotid stenosis measurements (when applicable) are obtained utilizing NASCET criteria, using the distal internal carotid diameter as the denominator. CONTRAST:  16mL OMNIPAQUE IOHEXOL 350 MG/ML SOLN COMPARISON:  CT head 03/27/2021.  No prior CTA. FINDINGS: CT HEAD FINDINGS For noncontrast findings, please see same day CT head. CTA NECK FINDINGS Aortic arch: Standard branching. Imaged portion shows no evidence of aneurysm or dissection. No significant stenosis of the major arch vessel origins. Aortic atherosclerosis. Right carotid system: No evidence of dissection, stenosis (50% or greater) or occlusion. Calcifications in the bifurcation and in the proximal right ICA, which are not hemodynamically significant. Left carotid system: Approximately 70% stenosis of the proximal left ICA, secondary to calcified and noncalcified plaque (series 6, image 174). No evidence of dissection. Vertebral arteries: Mild narrowing of the origin of the right vertebral artery, secondary to calcified and noncalcified plaque. The right vertebral artery is otherwise  patent and dominant. The left vertebral artery is patent from its origin to the vertebrobasilar junction. No evidence of dissection. Skeleton: Complete osseous fusion of the cervical spine, consistent with history of ankylosing spondylitis. No acute osseous abnormality. Other neck: Negative. Upper chest: Negative. Review of the MIP images confirms the above findings CTA HEAD FINDINGS Anterior circulation: Both internal carotid arteries are patent to the termini, without stenosis or other abnormality. Normal right A1. Hypoplastic or stenotic left A1. Normal anterior communicating artery. Anterior cerebral arteries are patent to their distal aspects. No M1 stenosis or occlusion. Normal MCA bifurcations. Distal MCA branches perfused and symmetric. Posterior circulation: Vertebral arteries patent to the vertebrobasilar junction without stenosis. Posterior inferior cerebral arteries patent bilaterally. Basilar patent to its distal aspect but mildly irregular. Superior cerebellar arteries patent bilaterally. PCAs perfused to their distal aspects without stenosis. Fetal origin of the right PCA, with a patent right posterior communicating artery. The left posterior communicating artery is not visualized. Venous sinuses: As permitted by contrast timing, patent. Anatomic variants: None significant Review of the MIP images confirms the above findings IMPRESSION: 1. Approximately 70% stenosis of the proximal left ICA. No other hemodynamically significant stenosis  in the neck. 2. No intracranial large vessel occlusion or significant stenosis. Electronically Signed   By: Merilyn Baba M.D.   On: 03/27/2021 23:54   CT HEAD WO CONTRAST (5MM)  Result Date: 03/27/2021 CLINICAL DATA:  Slurred speech, generalized weakness EXAM: CT HEAD WITHOUT CONTRAST TECHNIQUE: Contiguous axial images were obtained from the base of the skull through the vertex without intravenous contrast. COMPARISON:  None. FINDINGS: Brain: No evidence of acute  infarction, hemorrhage, hydrocephalus, extra-axial collection or mass lesion/mass effect. Global cortical atrophy. Subcortical white matter and periventricular small vessel ischemic changes. Vascular: No hyperdense vessel or unexpected calcification. Skull: Normal. Negative for fracture or focal lesion. Sinuses/Orbits: The visualized paranasal sinuses are essentially clear. The mastoid air cells are unopacified. Other: None. IMPRESSION: No evidence of acute intracranial abnormality. Atrophy with small vessel ischemic changes. Electronically Signed   By: Julian Hy M.D.   On: 03/27/2021 19:48   MR BRAIN WO CONTRAST  Result Date: 03/28/2021 CLINICAL DATA:  Transient ischemic attack, difficulty speaking EXAM: MRI HEAD WITHOUT CONTRAST TECHNIQUE: Multiplanar, multiecho pulse sequences of the brain and surrounding structures were obtained without intravenous contrast. COMPARISON:  No prior MRI, correlation is made with CT head and CTA head and neck FINDINGS: Evaluation is somewhat limited by motion artifact. Brain: Punctate focus of mild restricted diffusion in the posterior left frontal lobe (series 13, image 94), which may represent an acute or subacute infarct. No acute hemorrhage, mass, mass effect, or midline shift. T2 hyperintense signal in the periventricular white matter, likely the sequela of chronic small vessel ischemic disease. Global cerebral atrophy, not unexpected for age, which is slightly more pronounced in the parietal lobes bilaterally. No foci of hemosiderin deposition to suggest remote hemorrhage. Vascular: Normal flow voids. Skull and upper cervical spine: Osseous fusion of C1, C2, and C3, consistent with the patient's history of ankylosing spondylitis. Otherwise normal marrow signal. Sinuses/Orbits: Negative.  Status post bilateral lens replacements. Other: None. IMPRESSION: Evaluation is somewhat limited by motion artifact. Within this limitation, there is a punctate focus of  restricted diffusion in the posterior left frontal lobe, which may represent acute or subacute infarct. Electronically Signed   By: Merilyn Baba M.D.   On: 03/28/2021 01:08   ECHOCARDIOGRAM COMPLETE  Result Date: 03/28/2021    ECHOCARDIOGRAM REPORT   Patient Name:   RASHOD GOUGEON [place] Date of Exam: 03/28/2021 Medical Rec #:  [id number]     Height:       68.0 in Accession #:    [id number]    Weight:       145.0 lb Date of Birth:  Nov 20, 1934     BSA:          1.783 m Patient Age:    43 years      BP:           109/53 mmHg Patient Gender: M             HR:           65 bpm. Exam Location:  Inpatient Procedure: 2D Echo, Color Doppler and Cardiac Doppler Indications:    TIA  History:        Patient has prior history of Echocardiogram examinations, most                 recent 09/29/2016. Signs/Symptoms:Dyspnea; Risk                 Factors:Dyslipidemia.  Sonographer:    Bernadene Person RDCS Referring Phys: [id number] Leory Plowman  S CHOTINER IMPRESSIONS  1. Left ventricular ejection fraction, by estimation, is 60 to 65%. The left ventricle has normal function. The left ventricle has no regional wall motion abnormalities. Left ventricular diastolic parameters are indeterminate.  2. Right ventricular systolic function is normal. The right ventricular size is normal. There is normal pulmonary artery systolic pressure.  3. Left atrial size was mildly dilated.  4. Thickening of the mitral vavle c/f degenerative dx. No evidence of mitral valve regurgitation.  5. Focal calcification. Aortic valve regurgitation is trivial. No aortic stenosis is present.  6. The inferior vena cava is normal in size with greater than 50% respiratory variability, suggesting right atrial pressure of 3 mmHg. Conclusion(s)/Recommendation(s): Compared to prior echo 09/29/2016 no significant changes. FINDINGS  Left Ventricle: Left ventricular ejection fraction, by estimation, is 60 to 65%. The left ventricle has normal function. The left ventricle has no regional  wall motion abnormalities. The left ventricular internal cavity size was normal in size. There is  no left ventricular hypertrophy. Left ventricular diastolic parameters are indeterminate. Right Ventricle: The right ventricular size is normal. No increase in right ventricular wall thickness. Right ventricular systolic function is normal. There is normal pulmonary artery systolic pressure. The tricuspid regurgitant velocity is 2.16 m/s, and  with an assumed right atrial pressure of 3 mmHg, the estimated right ventricular systolic pressure is 44.3 mmHg. Left Atrium: Left atrial size was mildly dilated. Right Atrium: Right atrial size was normal in size. Prominent Eustachian valve. Pericardium: There is no evidence of pericardial effusion. Mitral Valve: Thickening of the mitral vavle c/f degenerative dx. There is mild calcification of the mitral valve leaflet(s). No evidence of mitral valve regurgitation. Tricuspid Valve: The tricuspid valve is grossly normal. Tricuspid valve regurgitation is mild. Aortic Valve: Focal calcification. Aortic valve regurgitation is trivial. No aortic stenosis is present. Pulmonic Valve: The pulmonic valve was not well visualized. Pulmonic valve regurgitation is not visualized. Aorta: The aortic root and ascending aorta are structurally normal, with no evidence of dilitation. Venous: The inferior vena cava is normal in size with greater than 50% respiratory variability, suggesting right atrial pressure of 3 mmHg. IAS/Shunts: The atrial septum is grossly normal.  LEFT VENTRICLE PLAX 2D LVIDd:         4.70 cm      Diastology LVIDs:         2.40 cm      LV e' medial:    5.51 cm/s LV PW:         0.80 cm      LV E/e' medial:  16.6 LV IVS:        0.90 cm      LV e' lateral:   7.38 cm/s LVOT diam:     2.10 cm      LV E/e' lateral: 12.4 LV SV:         71 LV SV Index:   40 LVOT Area:     3.46 cm  LV Volumes (MOD) LV vol d, MOD A2C: 93.0 ml LV vol d, MOD A4C: 114.0 ml LV vol s, MOD A2C: 37.5 ml LV  vol s, MOD A4C: 53.7 ml LV SV MOD A2C:     55.5 ml LV SV MOD A4C:     114.0 ml LV SV MOD BP:      57.8 ml RIGHT VENTRICLE RV S prime:     13.10 cm/s TAPSE (M-mode): 2.1 cm LEFT ATRIUM  Index        RIGHT ATRIUM           Index LA diam:        3.10 cm 1.74 cm/m   RA Area:     13.40 cm LA Vol (A2C):   50.3 ml 28.22 ml/m  RA Volume:   33.20 ml  18.62 ml/m LA Vol (A4C):   42.4 ml 23.78 ml/m LA Biplane Vol: 48.4 ml 27.15 ml/m  AORTIC VALVE LVOT Vmax:   106.00 cm/s LVOT Vmean:  65.100 cm/s LVOT VTI:    0.205 m  AORTA Ao Root diam: 3.60 cm Ao Asc diam:  3.80 cm MITRAL VALVE               TRICUSPID VALVE MV Area (PHT): 3.17 cm    TR Peak grad:   18.7 mmHg MV Decel Time: 239 msec    TR Vmax:        216.00 cm/s MV E velocity: 91.60 cm/s MV A velocity: 81.80 cm/s  SHUNTS MV E/A ratio:  1.12        Systemic VTI:  0.20 m                            Systemic Diam: 2.10 cm Phineas Inches Electronically signed by Phineas Inches Signature Date/Time: 03/28/2021/5:49:12 PM    Final     Scheduled Meds:    aspirin EC  325 mg Oral Daily   citalopram  20 mg Oral Daily   clopidogrel  75 mg Oral Daily   midodrine  5 mg Oral TID WC   rosuvastatin  10 mg Oral QHS   sodium chloride flush  3 mL Intravenous Q12H    Continuous Infusions:    sodium chloride       LOS: 1 day     Vernell Leep, MD,  FACP, [facility], [facility], [facility] (Care Management Physician Certified) [place]  To contact the attending provider between 7A-7P or the covering provider during after hours 7P-7A, please log into the web site [email or web address] and access using universal [place] password for that web site. If you do not have the password, please call the hospital operator.  03/29/2021, 2:09 PM

## 2021-03-29 NOTE — Plan of Care (Signed)
Pt is alert oriented x 4. Pts BP have been 84-665 systolic. Pt denies pain. No distress noted.    Problem: Education: Goal: Knowledge of General Education information will improve Description: Including pain rating scale, medication(s)/side effects and non-pharmacologic comfort measures Outcome: Progressing   Problem: Health Behavior/Discharge Planning: Goal: Ability to manage health-related needs will improve Outcome: Progressing   Problem: Clinical Measurements: Goal: Ability to maintain clinical measurements within normal limits will improve Outcome: Progressing Goal: Will remain free from infection Outcome: Progressing Goal: Diagnostic test results will improve Outcome: Progressing Goal: Respiratory complications will improve Outcome: Progressing Goal: Cardiovascular complication will be avoided Outcome: Progressing   Problem: Activity: Goal: Risk for activity intolerance will decrease Outcome: Progressing   Problem: Nutrition: Goal: Adequate nutrition will be maintained Outcome: Progressing   Problem: Coping: Goal: Level of anxiety will decrease Outcome: Progressing   Problem: Elimination: Goal: Will not experience complications related to bowel motility Outcome: Progressing Goal: Will not experience complications related to urinary retention Outcome: Progressing   Problem: Pain Managment: Goal: General experience of comfort will improve Outcome: Progressing   Problem: Safety: Goal: Ability to remain free from injury will improve Outcome: Progressing   Problem: Skin Integrity: Goal: Risk for impaired skin integrity will decrease Outcome: Progressing   Problem: Education: Goal: Knowledge of disease or condition will improve Outcome: Progressing Goal: Knowledge of secondary prevention will improve (SELECT ALL) Outcome: Progressing Goal: Knowledge of patient specific risk factors will improve (INDIVIDUALIZE FOR PATIENT) Outcome: Progressing Goal:  Individualized Educational Video(s) Outcome: Progressing   Problem: Coping: Goal: Will verbalize positive feelings about self Outcome: Progressing Goal: Will identify appropriate support needs Outcome: Progressing   Problem: Self-Care: Goal: Ability to participate in self-care as condition permits will improve Outcome: Progressing

## 2021-03-29 NOTE — Progress Notes (Addendum)
STROKE TEAM PROGRESS NOTE   INTERVAL HISTORY Patient is seen in room laying in bed.  Patient has no residual deficits that he can notice. He does report that over the last 6 months he has had trouble verbalizing/explaining what he is thinking. We discussed the possibility of a carotid revascularization  procedure next week for his Left ICA stenosis. Started DAPT with plavix and aspirin yesterday.   Vitals:   03/29/21 0007 03/29/21 0334 03/29/21 0840 03/29/21 1156  BP: 104/60 (!) 102/52 (!) 123/57 (!) 90/50  Pulse: 61 60 (!) 57 (!) 59  Resp: 18 16  18   Temp: 98.7 F (37.1 C) 98 F (36.7 C) 98.2 F (36.8 C)   TempSrc: Oral Oral    SpO2:  96% 100% 98%  Weight:      Height:       CBC:  Recent Labs  Lab 03/27/21 1248 03/27/21 1909  WBC 6.5 6.4  NEUTROABS 5.1 5.0  HGB 11.3* 11.8*  HCT 33.8* 35.3*  MCV 98.5 101.1*  PLT 234 694    Basic Metabolic Panel:  Recent Labs  Lab 03/27/21 1248 03/27/21 1909  NA 138 136  K 4.0 3.9  CL 103 101  CO2 27 27  GLUCOSE 106* 126*  BUN 13 11  CREATININE 0.77 0.75  CALCIUM 9.0 9.0    Lipid Panel:  Recent Labs  Lab 03/28/21 0211  CHOL 150  TRIG 171*  HDL 50  CHOLHDL 3.0  VLDL 34  LDLCALC 66    HgbA1c:  Recent Labs  Lab 03/28/21 0211  HGBA1C 6.4*    Urine Drug Screen:  Recent Labs  Lab 03/27/21 1909  LABOPIA NONE DETECTED  COCAINSCRNUR NONE DETECTED  LABBENZ NONE DETECTED  AMPHETMU NONE DETECTED  THCU NONE DETECTED  LABBARB NONE DETECTED     Alcohol Level  Recent Labs  Lab 03/27/21 1857  ETH <10     IMAGING past 24 hours ECHOCARDIOGRAM COMPLETE  Result Date: 03/28/2021    ECHOCARDIOGRAM REPORT   Patient Name:   Steven [name] Date of Exam: 03/28/2021 Medical Rec #:  [id number]     Height:       68.0 in Accession #:    [id number]    Weight:       145.0 lb Date of Birth:  June 30, 1934     BSA:          1.783 m Patient Age:    86 years      BP:           109/53 mmHg Patient Gender: M             HR:           65  bpm. Exam Location:  Inpatient Procedure: 2D Echo, Color Doppler and Cardiac Doppler Indications:    TIA  History:        Patient has prior history of Echocardiogram examinations, most                 recent 09/29/2016. Signs/Symptoms:Dyspnea; Risk                 Factors:Dyslipidemia.  Sonographer:    Bernadene Person RDCS Referring Phys: [id number] Lead  1. Left ventricular ejection fraction, by estimation, is 60 to 65%. The left ventricle has normal function. The left ventricle has no regional wall motion abnormalities. Left ventricular diastolic parameters are indeterminate.  2. Right ventricular systolic function is normal. The right ventricular size is normal. There  is normal pulmonary artery systolic pressure.  3. Left atrial size was mildly dilated.  4. Thickening of the mitral vavle c/f degenerative dx. No evidence of mitral valve regurgitation.  5. Focal calcification. Aortic valve regurgitation is trivial. No aortic stenosis is present.  6. The inferior vena cava is normal in size with greater than 50% respiratory variability, suggesting right atrial pressure of 3 mmHg. Conclusion(s)/Recommendation(s): Compared to prior echo 09/29/2016 no significant changes. FINDINGS  Left Ventricle: Left ventricular ejection fraction, by estimation, is 60 to 65%. The left ventricle has normal function. The left ventricle has no regional wall motion abnormalities. The left ventricular internal cavity size was normal in size. There is  no left ventricular hypertrophy. Left ventricular diastolic parameters are indeterminate. Right Ventricle: The right ventricular size is normal. No increase in right ventricular wall thickness. Right ventricular systolic function is normal. There is normal pulmonary artery systolic pressure. The tricuspid regurgitant velocity is 2.16 m/s, and  with an assumed right atrial pressure of 3 mmHg, the estimated right ventricular systolic pressure is 73.4 mmHg. Left Atrium: Left  atrial size was mildly dilated. Right Atrium: Right atrial size was normal in size. Prominent Eustachian valve. Pericardium: There is no evidence of pericardial effusion. Mitral Valve: Thickening of the mitral vavle c/f degenerative dx. There is mild calcification of the mitral valve leaflet(s). No evidence of mitral valve regurgitation. Tricuspid Valve: The tricuspid valve is grossly normal. Tricuspid valve regurgitation is mild. Aortic Valve: Focal calcification. Aortic valve regurgitation is trivial. No aortic stenosis is present. Pulmonic Valve: The pulmonic valve was not well visualized. Pulmonic valve regurgitation is not visualized. Aorta: The aortic root and ascending aorta are structurally normal, with no evidence of dilitation. Venous: The inferior vena cava is normal in size with greater than 50% respiratory variability, suggesting right atrial pressure of 3 mmHg. IAS/Shunts: The atrial septum is grossly normal.  LEFT VENTRICLE PLAX 2D LVIDd:         4.70 cm      Diastology LVIDs:         2.40 cm      LV e' medial:    5.51 cm/s LV PW:         0.80 cm      LV E/e' medial:  16.6 LV IVS:        0.90 cm      LV e' lateral:   7.38 cm/s LVOT diam:     2.10 cm      LV E/e' lateral: 12.4 LV SV:         71 LV SV Index:   40 LVOT Area:     3.46 cm  LV Volumes (MOD) LV vol d, MOD A2C: 93.0 ml LV vol d, MOD A4C: 114.0 ml LV vol s, MOD A2C: 37.5 ml LV vol s, MOD A4C: 53.7 ml LV SV MOD A2C:     55.5 ml LV SV MOD A4C:     114.0 ml LV SV MOD BP:      57.8 ml RIGHT VENTRICLE RV S prime:     13.10 cm/s TAPSE (M-mode): 2.1 cm LEFT ATRIUM             Index        RIGHT ATRIUM           Index LA diam:        3.10 cm 1.74 cm/m   RA Area:     13.40 cm LA Vol (A2C):   50.3 ml 28.Seboyeta  ml/m  RA Volume:   33.20 ml  18.62 ml/m LA Vol (A4C):   42.4 ml 23.78 ml/m LA Biplane Vol: 48.4 ml 27.15 ml/m  AORTIC VALVE LVOT Vmax:   106.00 cm/s LVOT Vmean:  65.100 cm/s LVOT VTI:    0.205 m  AORTA Ao Root diam: 3.60 cm Ao Asc diam:  3.80  cm MITRAL VALVE               TRICUSPID VALVE MV Area (PHT): 3.17 cm    TR Peak grad:   18.7 mmHg MV Decel Time: 239 msec    TR Vmax:        216.00 cm/s MV E velocity: 91.60 cm/s MV A velocity: 81.80 cm/s  SHUNTS MV E/A ratio:  1.12        Systemic VTI:  0.20 m                            Systemic Diam: 2.10 cm Phineas Inches Electronically signed by Phineas Inches Signature Date/Time: 03/28/2021/5:49:12 PM    Final     PHYSICAL EXAM  Temp:  [98 F (36.7 C)-98.9 F (37.2 C)] 98.2 F (36.8 C) (12/17 0840) Pulse Rate:  [57-67] 59 (12/17 1156) Resp:  [14-18] 18 (12/17 1156) BP: (90-123)/(50-60) 90/50 (12/17 1156) SpO2:  [96 %-100 %] 98 % (12/17 1156)  General - Well nourished, well developed, in no apparent distress. Ophthalmologic - fundi not visualized due to noncooperation. Cardiovascular - Regular rhythm and rate.  Mental Status -  Level of arousal and orientation to time, place, and person were intact. Language including expression, naming, repetition, comprehension was assessed and found intact. Attention span and concentration were normal. Recent and remote memory were intact. Fund of Knowledge was assessed and was intact.  Cranial Nerves II - XII - II - Visual field intact OU. III, IV, VI - Extraocular movements intact. V - Facial sensation intact bilaterally. VII - Facial movement intact bilaterally. VIII - Hearing & vestibular intact bilaterally. X - Palate elevates symmetrically. XI - Chin turning & shoulder shrug intact bilaterally. XII - Tongue protrusion intact.  Motor Strength - The patients strength was normal in all extremities and pronator drift was absent.  Bulk was normal and fasciculations were absent.   Motor Tone - Muscle tone was assessed at the neck and appendages and was normal. Reflexes - The patients reflexes were symmetrical in all extremities and he had no pathological reflexes. Sensory - Light touch, temperature/pinprick were assessed and were symmetrical.    Coordination - The patient had normal movements in the hands and feet with no ataxia or dysmetria.  Tremor was absent. Gait and Station - deferred.   ASSESSMENT/PLAN Mr. WADSWORTH SKOLNICK is a 86 y.o. male with history of ankylosis spondylitis, iron deficiency anemia presenting with difficulty with his speech. He initially went to a doctors appointment with Dr. Lorenso Courier on 12/15 in his usual state of health. Around lunch time, he went to get lunch and noticed his speech was gibberish at checkout. He noticed some confusion but he was able to pay for his food. He then went to a hardware store afterwards where no one could understand him, he knew he needed salt for his water softener, but could not verbalize it. His wife came home around 7pm and found that his speech was still gibberish so she called EMS. His symptoms improved when EMS arrived.  CTA shows 70% stenosis of his proximal left  ICA.  BVS was consulted and Dr. Shirlean Mylar will plan for a TCAR on Monday.  Stroke:  left punctate infarct posterior left frontal lobe likely secondary small vessel disease source and left ICA stenosis  Code Stroke CT head Small vessel disease. Atrophy CTA head & neck 70% stenosis of proximal left ICA MRI  acute/subacute punctate infarct in posterior left frontal lobe 2D Echo pending LDL 66 HgbA1c 6.4 VTE prophylaxis - SCDs No antithrombotic prior to admission, now on aspirin 325 mg daily and clopidogrel 75 mg daily.  Therapy recommendations:  possible outpatient PT vs none pending progress Disposition:  pending   Hypotension BP goal systolic 209 NS bolus [id number] Encourage fluids Consider midodrine until procedure if BP does not improve with fluids  Left ICA stenosis Vascular surgery consult Avoid hypotension- encourage fluids TCAR with Dr. Shirlean Mylar on Monday  Hyperlipidemia Home meds:  Crestor 10mg , resumed in hospital LDL 66, goal < 70 Continue statin at discharge  Other Stroke Risk Factors Advanced Age >/= 65   ETOH use, alcohol level <10, advised to drink no more than 1-2 drink(s) a day Coronary artery disease Migraines Obstructive sleep apnea, on CPAP at home Congestive heart failure  Other Active Problems Ankylosising spondolitis Iron deficiency anemia  Hospital day # 1  Patient seen and examined by NP/APP with MD. MD to update note as needed.   Janine Ores, DNP, FNP-BC [facility] Pager: [phone]  I have personally obtained history,examined this patient, reviewed notes, independently viewed imaging studies, participated in medical decision making and plan of care.ROS completed by me personally and pertinent positives fully documented  I have made any additions or clarifications directly to the above note. Agree with note above.  Continue ongoing antiplatelet therapy and plan for elective carotid revascularization early next week.  Long discussion with patient and answer questions.  Greater than 50% time during this 25-minute visit was spent in counseling coordination of care about his symptomatic carotid stenosis and discussion about carotid revascularization and answering questions.  Antony Contras, MD Medical Director [facility] Pager: [phone] 03/29/2021 3:23 PM   To contact Stroke Continuity provider, please refer to [email or web address] After hours, contact General Neurology

## 2021-03-29 NOTE — Progress Notes (Signed)
Physical Therapy Treatment Patient Details Name: Steven [name] MRN: [id number] DOB: [birth year] Today's Date: 03/29/2021   History of Present Illness Steven [name] is a 86 y.o. male with medical history significant for HLD, ankylosing spodilitis, hx of SBO due to adhesions with colostomy then reversal, R THA x2, L shoulder DCR, RCR admitted due to transiet aphasia. Undergoing work up for possible stroke and for possible carotid endarterectomy. MRI of brain showed:punctate focus of restricted diffusion in the  posterior left frontal lobe, which may represent acute or subacute  infarct    PT Comments    Today's session focused on orthostatic vitals and balance. See table below for vital results. Pt positive for orthostatic hypotension, however did not experience any signs or symptoms. Pt tolerated marching in place at EOB for ~3 min. No changes noted in pallor during position changes. Will continue to follow acutely for mobility progression.     03/29/21 1500  Vitals  Patient Position (if appropriate) Orthostatic Vitals  Orthostatic Lying   BP- Lying 103/57  Pulse- Lying 59  Orthostatic Sitting  BP- Sitting (!) 76/47  Pulse- Sitting 61  Orthostatic Standing at 0 minutes  BP- Standing at 0 minutes (!) 80/46  Pulse- Standing at 0 minutes 62  Orthostatic Standing at 3 minutes  BP- Standing at 3 minutes (!) 88/49  Pulse- Standing at 3 minutes (!) 49     Recommendations for follow up therapy are one component of a multi-disciplinary discharge planning process, led by the attending physician.  Recommendations may be updated based on patient status, additional functional criteria and insurance authorization.  Follow Up Recommendations  Outpatient PT (versus no follow up depending on progress)     Assistance Recommended at Discharge Intermittent Supervision/Assistance  Equipment Recommendations  None recommended by PT    Recommendations for Other Services       Precautions /  Restrictions Precautions Precautions: Fall     Mobility  Bed Mobility Overal bed mobility: Modified Independent             General bed mobility comments: increased time    Transfers Overall transfer level: Needs assistance Equipment used: None Transfers: Sit to/from Stand;Bed to chair/wheelchair/BSC Sit to Stand: Min guard           General transfer comment: min guard for safety as BP has been soft    Ambulation/Gait               General Gait Details: deffered as pt has had change in BP medications and BP has been very soft. RN advised not pushing it today.   Stairs             Wheelchair Mobility    Modified Rankin (Stroke Patients Only) Modified Rankin (Stroke Patients Only) Pre-Morbid Rankin Score: No significant disability Modified Rankin: Moderate disability     Balance Overall balance assessment: Needs assistance   Sitting balance-[name]: Good     Standing balance support: During functional activity;No upper extremity supported;Single extremity supported Standing balance-[name]: Fair Standing balance comment: stood w/o AD and marched in place                            Cognition Arousal/Alertness: Awake/alert Behavior During Therapy: WFL for tasks assessed/performed Overall Cognitive Status: Within Functional Limits for tasks assessed  Exercises Other Exercises Other Exercises: Marching in place for ~3 min.    General Comments General comments (skin integrity, edema, etc.): Wife present for session      Pertinent Vitals/Pain Pain Assessment: No/denies pain    Home Living                          Prior Function            PT Goals (current goals can now be found in the care plan section) Acute Rehab PT Goals Patient Stated Goal: to return to independent PT Goal Formulation: With patient Time For Goal Achievement:  04/11/21 Potential to Achieve Goals: Good Progress towards PT goals: Progressing toward goals    Frequency    Min 4X/week      PT Plan Current plan remains appropriate    Co-evaluation              AM-PAC PT "6 Clicks" Mobility   Outcome Measure  Help needed turning from your back to your side while in a flat bed without using bedrails?: None Help needed moving from lying on your back to sitting on the side of a flat bed without using bedrails?: A Little Help needed moving to and from a bed to a chair (including a wheelchair)?: A Little Help needed standing up from a chair using your arms (e.g., wheelchair or bedside chair)?: A Little Help needed to walk in hospital room?: A Little Help needed climbing 3-5 steps with a railing? : A Little 6 Click Score: 19    End of Session Equipment Utilized During Treatment: Gait belt Activity Tolerance: Patient tolerated treatment well Patient left: with call bell/phone within reach;in bed;with family/visitor present Nurse Communication: Mobility status;Other (comment) (Orthostatics) PT Visit Diagnosis: Other abnormalities of gait and mobility (R26.89);Muscle weakness (generalized) (M62.81)     Time: 7564-3329 PT Time Calculation (min) (ACUTE ONLY): 24 min  Charges:  $Therapeutic Activity: 23-37 mins                    Benjiman Core, [place] Pager [phone] Acute Rehab  Allena Katz 03/29/2021, 3:13 PM

## 2021-03-30 ENCOUNTER — Encounter: Payer: Self-pay | Admitting: Hematology and Oncology

## 2021-03-30 DIAGNOSIS — I951 Orthostatic hypotension: Secondary | ICD-10-CM

## 2021-03-30 LAB — URINALYSIS, MICROSCOPIC (REFLEX): Squamous Epithelial / HPF: NONE SEEN (ref 0–5)

## 2021-03-30 LAB — URINALYSIS, ROUTINE W REFLEX MICROSCOPIC
Bilirubin Urine: NEGATIVE
Glucose, UA: NEGATIVE mg/dL
Ketones, ur: NEGATIVE mg/dL
Nitrite: NEGATIVE
Protein, ur: NEGATIVE mg/dL
Specific Gravity, Urine: 1.02 (ref 1.005–1.030)
pH: 6 (ref 5.0–8.0)

## 2021-03-30 MED ORDER — SODIUM CHLORIDE 0.9 % IV SOLN
1.5000 g | INTRAVENOUS | Status: AC
  Administered 2021-03-31: 11:00:00 1.5 g via INTRAVENOUS
  Filled 2021-03-30: qty 1.5

## 2021-03-30 NOTE — Progress Notes (Signed)
STROKE TEAM PROGRESS NOTE   INTERVAL HISTORY Patient is seen in room laying in bed.  Patient has no new complaints today.  He is awaiting carotid revascularization procedure tomorrow.  Vital signs are stable.  Neurological exam is unchanged.  He had some orthostatic hypotension yesterday and was given some IV fluids and has been started on midodrine 10 mg 3 times daily.  Vitals:   03/30/21 0011 03/30/21 0420 03/30/21 0755 03/30/21 1226  BP: (!) 113/55 (!) 101/54 (!) 122/58 (!) 102/52  Pulse: 65 61 (!) 59 (!) 58  Resp: 19 20    Temp: 98.4 F (36.9 C) 99 F (37.2 C)    TempSrc: Oral Oral    SpO2: 97% 97% 98% 99%  Weight:      Height:       CBC:  Recent Labs  Lab 03/27/21 1248 03/27/21 1909  WBC 6.5 6.4  NEUTROABS 5.1 5.0  HGB 11.3* 11.8*  HCT 33.8* 35.3*  MCV 98.5 101.1*  PLT 234 409   Basic Metabolic Panel:  Recent Labs  Lab 03/27/21 1248 03/27/21 1909  NA 138 136  K 4.0 3.9  CL 103 101  CO2 27 27  GLUCOSE 106* 126*  BUN 13 11  CREATININE 0.77 0.75  CALCIUM 9.0 9.0   Lipid Panel:  Recent Labs  Lab 03/28/21 0211  CHOL 150  TRIG 171*  HDL 50  CHOLHDL 3.0  VLDL 34  LDLCALC 66   HgbA1c:  Recent Labs  Lab 03/28/21 0211  HGBA1C 6.4*   Urine Drug Screen:  Recent Labs  Lab 03/27/21 1909  LABOPIA NONE DETECTED  COCAINSCRNUR NONE DETECTED  LABBENZ NONE DETECTED  AMPHETMU NONE DETECTED  THCU NONE DETECTED  LABBARB NONE DETECTED    Alcohol Level  Recent Labs  Lab 03/27/21 1857  ETH <10    IMAGING past 24 hours No results found.  PHYSICAL EXAM  Temp:  [98.3 F (36.8 C)-99 F (37.2 C)] 99 F (37.2 C) (12/18 0420) Pulse Rate:  [58-65] 58 (12/18 1226) Resp:  [18-20] 20 (12/18 0420) BP: (101-132)/(52-64) 102/52 (12/18 1226) SpO2:  [97 %-99 %] 99 % (12/18 1226)  General - Well nourished, well developed, in no apparent distress. Ophthalmologic - fundi not visualized due to noncooperation. Cardiovascular - Regular rhythm and rate.  Mental  Status -  Level of arousal and orientation to time, place, and person were intact. Language including expression, naming, repetition, comprehension was assessed and found intact. Attention span and concentration were normal. Recent and remote memory were intact. Fund of Knowledge was assessed and was intact.  Cranial Nerves II - XII - II - Visual field intact OU. III, IV, VI - Extraocular movements intact. V - Facial sensation intact bilaterally. VII - Facial movement intact bilaterally. VIII - Hearing & vestibular intact bilaterally. X - Palate elevates symmetrically. XI - Chin turning & shoulder shrug intact bilaterally. XII - Tongue protrusion intact.  Motor Strength - The patients strength was normal in all extremities and pronator drift was absent.  Bulk was normal and fasciculations were absent.   Motor Tone - Muscle tone was assessed at the neck and appendages and was normal. Reflexes - The patients reflexes were symmetrical in all extremities and he had no pathological reflexes. Sensory - Light touch, temperature/pinprick were assessed and were symmetrical.   Coordination - The patient had normal movements in the hands and feet with no ataxia or dysmetria.  Tremor was absent. Gait and Station - deferred.   ASSESSMENT/PLAN [name] is a 86 y.o. male with history of ankylosis spondylitis, iron deficiency anemia presenting with difficulty with his speech. He initially went to a doctors appointment with Dr. Lorenso [name] on 12/15 in his usual state of health. Around lunch time, he went to get lunch and noticed his speech was gibberish at checkout. He noticed some confusion but he was able to pay for his food. He then went to a hardware store afterwards where no one could understand him, he knew he needed salt for his water softener, but could not verbalize it. His wife came home around 7pm and found that his speech was still gibberish so she called EMS. His symptoms improved when  EMS arrived.  CTA shows 70% stenosis of his proximal left ICA.  BVS was consulted and Dr. Shirlean [name] will plan for a TCAR on Monday.  Stroke:  left punctate infarct posterior left frontal lobe likely secondary small vessel disease source and left ICA stenosis  Code Stroke CT head Small vessel disease. Atrophy CTA head & neck 70% stenosis of proximal left ICA MRI  acute/subacute punctate infarct in posterior left frontal lobe 2D Echo pending LDL 66 HgbA1c 6.4 VTE prophylaxis - SCDs No antithrombotic prior to admission, now on aspirin 325 mg daily and clopidogrel 75 mg daily.  Therapy recommendations:  possible outpatient PT vs none pending progress Disposition: Home Hypotension BP goal systolic 156 NS bolus 153PH Encourage fluids Consider midodrine until procedure if BP does not improve with fluids  Left ICA stenosis Vascular surgery consult Avoid hypotension- encourage fluids TCAR with Dr. Shirlean [name] on Monday  Hyperlipidemia Home meds:  Crestor 10mg , resumed in hospital LDL 66, goal < 70 Continue statin at discharge  Other Stroke Risk Factors Advanced Age >/= 65  ETOH use, alcohol level <10, advised to drink no more than 1-2 drink(s) a day Coronary artery disease Migraines Obstructive sleep apnea, on CPAP at home Congestive heart failure  Other Active Problems Ankylosising spondolitis Iron deficiency anemia  Hospital day # 2    Continue ongoing antiplatelet therapy and plan for elective carotid revascularization early next week.  Avoid hypotension and agree with hydration and low-dose midodrine.  Long discussion with patient and answer questions.  Discussed with patient and Dr. Algis [name].greater than 50% time during this 25-minute visit was spent in counseling coordination of care about his symptomatic carotid stenosis and discussion about carotid revascularization and answering questions.  Steven Contras, MD Medical Director [place] Pager:  [phone] 03/30/2021 1:27 PM   To contact Stroke Continuity provider, please refer to [email or web address] After hours, contact General Neurology

## 2021-03-30 NOTE — Progress Notes (Addendum)
PROGRESS NOTE   Steven [name]  AJO:[id number]    DOB: 03-09-1935    DOA: 03/27/2021  PCP: [name], MD   I have briefly reviewed patients previous medical records in [facility].  Chief complaint: Speech difficulty   Brief Narrative:  86 year old male with medical history significant for hyperlipidemia, ankylosing spondylitis, anxiety and depression, presented to ED on 03/27/2021 with speech difficulty which lasted for approximately 6 hours on day of admission.  Admitted for acute ischemic stroke suspected due to symptomatic left ICA stenosis.  Neurology/stroke MD and vascular surgery were consulted.  Stroke work-up was completed.  Vascular surgery plans TCAR (plans carotid artery revascularization) on 03/31/2021.  Issues with hypotension/orthostatic hypotension, treated with IV fluids and midodrine.  Improved.   Assessment & Plan:  Principal Problem:   TIA (transient ischemic attack) Active Problems:   Hyperlipidemia   History of ankylosing spondylitis   CVA (cerebral vascular accident) ([place])   Stenosis of left carotid artery   Acute ischemic stroke (posterior left frontal lobe) secondary to symptomatic left ICA stenosis: -CT head 12/15: No acute intracranial abnormality. -MRI brain: Punctate focus of restricted diffusion in the posterior left frontal lobe, which may represent acute or subacute infarct. -CTA head and neck: Approximately 70% stenosis of the proximal left ICA.  No other hemodynamically significant stenosis in the neck.  No intracranial large vessel occlusion or significant stenosis. -2D echo: LVEF 60 to 65%.  No aortic stenosis is present. -A1c 6.4.  LDL 66. -PT recommends outpatient PT versus no follow-up.  OT and SLP have no follow-up needs -Not on antiplatelets PTA.  Now on DAPT with aspirin 325 Mg daily + Plavix 75 Mg daily.  Continue statins. -Neurology consultation appreciated.   - Vascular surgery consulted for management of left ICA  stenosis.  Symptomatic left ICA stenosis -Vascular surgery consultation appreciated and plan TCAR on 12/19 and recommend that patient stay through the weekend for the procedure.  N.p.o. after midnight for same.  Hypotension/orthostatic hypotension -Had ongoing issues with asymptomatic hypotension/orthostatic hypotension since admission despite IV fluids.  Initiated midodrine 10 Mg 3 times daily.  Blood pressures have improved.  Continue to monitor carefully. -Added bilateral knee-high compression stockings. -Avoid hypotension given left ICA stenosis and risk of worsening stroke.  Hyperlipidemia - Continue Crestor.  Ankylosing spondylitis -On weekly methotrexate and daily folic acid supplements for same.  Anxiety and depression -Does not appear to be on any medications for this.  Macrocytic anemia -May be related to anemia of chronic disease versus other etiology.  Stable.  Outpatient follow-up.  Body mass index is 22.05 kg/m.    DVT prophylaxis: SCD's Start: 03/27/21 2142     Code Status: Full Code Family Communication: None at bedside.  Unable to get spouse on the phone. Disposition:  Status is: Inpatient     Consultants:   Neurology Vascular surgery  Procedures:   None  Antimicrobials:   None   Subjective:  Denies complaints.  Watching the work-up soccer final game.  No dizziness or lightheadedness.  No recurrence of speech difficulties or other strokelike symptoms.  Objective:   Vitals:   03/29/21 2010 03/30/21 0011 03/30/21 0420 03/30/21 0755  BP: 132/64 (!) 113/55 (!) 101/54 (!) 122/58  Pulse: 61 65 61 (!) 59  Resp: 18 19 20    Temp: 98.5 F (36.9 C) 98.4 F (36.9 C) 99 F (37.2 C)   TempSrc: Oral Oral Oral   SpO2: 97% 97% 97% 98%  Weight:  Height:        General exam: Elderly male, moderately built and thinly nourished sitting up comfortably in reclining chair without distress. Respiratory system: Clear to auscultation.  No increased work of  breathing. Cardiovascular system: S1 and S2 heard, RRR.  No JVD, murmurs or pedal edema. Gastrointestinal system: Abdomen is nondistended, soft and nontender. No organomegaly or masses felt. Normal bowel sounds heard. Central nervous system: Alert and oriented. No focal neurological deficits.  No dysarthria or aphasia appreciated. Extremities: Symmetric 5 x 5 power. Skin: No rashes, lesions or ulcers Psychiatry: Judgement and insight appear normal. Mood & affect appropriate.     Data Reviewed:   I have personally reviewed following labs and imaging studies   CBC: Recent Labs  Lab 03/27/21 1248 03/27/21 1909  WBC 6.5 6.4  NEUTROABS 5.1 5.0  HGB 11.3* 11.8*  HCT 33.8* 35.3*  MCV 98.5 101.1*  PLT 234 814    Basic Metabolic Panel: Recent Labs  Lab 03/27/21 1248 03/27/21 1909  NA 138 136  K 4.0 3.9  CL 103 101  CO2 27 27  GLUCOSE 106* 126*  BUN 13 11  CREATININE 0.77 0.75  CALCIUM 9.0 9.0    Liver Function Tests: Recent Labs  Lab 03/27/21 1248 03/27/21 1909  AST 18 20  ALT 18 18  ALKPHOS 56 48  BILITOT 0.9 0.6  PROT 7.4 7.1  ALBUMIN 3.8 3.8    CBG: Recent Labs  Lab 03/27/21 1903  GLUCAP 130*    Microbiology Studies:   Recent Results (from the past 240 hour(s))  Resp Panel by RT-PCR (Flu A&B, Covid) Nasopharyngeal Swab     Status: None   Collection Time: 03/27/21  7:09 PM   Specimen: Nasopharyngeal Swab; Nasopharyngeal(NP) swabs in vial transport medium  Result Value Ref Range Status   SARS Coronavirus 2 by RT PCR NEGATIVE NEGATIVE Final    Comment: (NOTE) SARS-CoV-2 target nucleic acids are NOT DETECTED.  The SARS-CoV-2 RNA is generally detectable in upper respiratory specimens during the acute phase of infection. The lowest concentration of SARS-CoV-2 viral copies this assay can detect is 138 copies/mL. A negative result does not preclude SARS-Cov-2 infection and should not be used as the sole basis for treatment or other patient management  decisions. A negative result may occur with  improper specimen collection/handling, submission of specimen other than nasopharyngeal swab, presence of viral mutation(s) within the areas targeted by this assay, and inadequate number of viral copies(<138 copies/mL). A negative result must be combined with clinical observations, patient history, and epidemiological information. The expected result is Negative.  Fact Sheet for Patients:  EntrepreneurPulse.com.au  Fact Sheet for Healthcare Providers:  IncredibleEmployment.be  This test is no t yet approved or cleared by the [place] FDA and  has been authorized for detection and/or diagnosis of SARS-CoV-2 by FDA under an Emergency Use Authorization (EUA). This EUA will remain  in effect (meaning this test can be used) for the duration of the COVID-19 declaration under Section 564(b)(1) of the Act, 21 U.S.C.section 360bbb-3(b)(1), unless the authorization is terminated  or revoked sooner.       Influenza A by PCR NEGATIVE NEGATIVE Final   Influenza B by PCR NEGATIVE NEGATIVE Final    Comment: (NOTE) The Xpert Xpress SARS-CoV-2/FLU/RSV plus assay is intended as an aid in the diagnosis of influenza from Nasopharyngeal swab specimens and should not be used as a sole basis for treatment. Nasal washings and aspirates are unacceptable for Xpert Xpress SARS-CoV-2/FLU/RSV testing.  Fact Sheet for Patients: EntrepreneurPulse.com.au  Fact Sheet for Healthcare Providers: IncredibleEmployment.be  This test is not yet approved or cleared by the [place] FDA and has been authorized for detection and/or diagnosis of SARS-CoV-2 by FDA under an Emergency Use Authorization (EUA). This EUA will remain in effect (meaning this test can be used) for the duration of the COVID-19 declaration under Section 564(b)(1) of the Act, 21 U.S.C. section 360bbb-3(b)(1), unless the  authorization is terminated or revoked.  Performed at [facility], [place] [address] [place], [place] [address]     Radiology Studies:  ECHOCARDIOGRAM COMPLETE  Result Date: 03/28/2021    ECHOCARDIOGRAM REPORT   Patient Name:   COE ANGELOS Marteney Date of Exam: 03/28/2021 Medical Rec #:  [id number]     Height:       68.0 in Accession #:    [id number]    Weight:       145.0 lb Date of Birth:  08/31/1934     BSA:          1.783 m Patient Age:    11 years      BP:           109/53 mmHg Patient Gender: M             HR:           65 bpm. Exam Location:  Inpatient Procedure: 2D Echo, Color Doppler and Cardiac Doppler Indications:    TIA  History:        Patient has prior history of Echocardiogram examinations, most                 recent 09/29/2016. Signs/Symptoms:Dyspnea; Risk                 Factors:Dyslipidemia.  Sonographer:    Bernadene Person RDCS Referring Phys: [id number] [place]  1. Left ventricular ejection fraction, by estimation, is 60 to 65%. The left ventricle has normal function. The left ventricle has no regional wall motion abnormalities. Left ventricular diastolic parameters are indeterminate.  2. Right ventricular systolic function is normal. The right ventricular size is normal. There is normal pulmonary artery systolic pressure.  3. Left atrial size was mildly dilated.  4. Thickening of the mitral vavle c/f degenerative dx. No evidence of mitral valve regurgitation.  5. Focal calcification. Aortic valve regurgitation is trivial. No aortic stenosis is present.  6. The inferior vena cava is normal in size with greater than 50% respiratory variability, suggesting right atrial pressure of 3 mmHg. Conclusion(s)/Recommendation(s): Compared to prior echo 09/29/2016 no significant changes. FINDINGS  Left Ventricle: Left ventricular ejection fraction, by estimation, is 60 to 65%. The left ventricle has normal function. The left ventricle has no regional wall motion abnormalities. The  left ventricular internal cavity size was normal in size. There is  no left ventricular hypertrophy. Left ventricular diastolic parameters are indeterminate. Right Ventricle: The right ventricular size is normal. No increase in right ventricular wall thickness. Right ventricular systolic function is normal. There is normal pulmonary artery systolic pressure. The tricuspid regurgitant velocity is 2.16 m/s, and  with an assumed right atrial pressure of 3 mmHg, the estimated right ventricular systolic pressure is 22.0 mmHg. Left Atrium: Left atrial size was mildly dilated. Right Atrium: Right atrial size was normal in size. Prominent Eustachian valve. Pericardium: There is no evidence of pericardial effusion. Mitral Valve: Thickening of the mitral vavle c/f degenerative dx. There is mild calcification of the mitral valve  leaflet(s). No evidence of mitral valve regurgitation. Tricuspid Valve: The tricuspid valve is grossly normal. Tricuspid valve regurgitation is mild. Aortic Valve: Focal calcification. Aortic valve regurgitation is trivial. No aortic stenosis is present. Pulmonic Valve: The pulmonic valve was not well visualized. Pulmonic valve regurgitation is not visualized. Aorta: The aortic root and ascending aorta are structurally normal, with no evidence of dilitation. Venous: The inferior vena cava is normal in size with greater than 50% respiratory variability, suggesting right atrial pressure of 3 mmHg. IAS/Shunts: The atrial septum is grossly normal.  LEFT VENTRICLE PLAX 2D LVIDd:         4.70 cm      Diastology LVIDs:         2.40 cm      LV e' medial:    5.51 cm/s LV PW:         0.80 cm      LV E/e' medial:  16.6 LV IVS:        0.90 cm      LV e' lateral:   7.38 cm/s LVOT diam:     2.10 cm      LV E/e' lateral: 12.4 LV SV:         71 LV SV Index:   40 LVOT Area:     3.46 cm  LV Volumes (MOD) LV vol d, MOD A2C: 93.0 ml LV vol d, MOD A4C: 114.0 ml LV vol s, MOD A2C: 37.5 ml LV vol s, MOD A4C: 53.7 ml LV SV  MOD A2C:     55.5 ml LV SV MOD A4C:     114.0 ml LV SV MOD BP:      57.8 ml RIGHT VENTRICLE RV S prime:     13.10 cm/s TAPSE (M-mode): 2.1 cm LEFT ATRIUM             Index        RIGHT ATRIUM           Index LA diam:        3.10 cm 1.74 cm/m   RA Area:     13.40 cm LA Vol (A2C):   50.3 ml 28.22 ml/m  RA Volume:   33.20 ml  18.62 ml/m LA Vol (A4C):   42.4 ml 23.78 ml/m LA Biplane Vol: 48.4 ml 27.15 ml/m  AORTIC VALVE LVOT Vmax:   106.00 cm/s LVOT Vmean:  65.100 cm/s LVOT VTI:    0.205 m  AORTA Ao Root diam: 3.60 cm Ao Asc diam:  3.80 cm MITRAL VALVE               TRICUSPID VALVE MV Area (PHT): 3.17 cm    TR Peak grad:   18.7 mmHg MV Decel Time: 239 msec    TR Vmax:        216.00 cm/s MV E velocity: 91.60 cm/s MV A velocity: 81.80 cm/s  SHUNTS MV E/A ratio:  1.12        Systemic VTI:  0.20 m                            Systemic Diam: 2.10 cm Phineas Inches Electronically signed by Phineas Inches Signature Date/Time: 03/28/2021/5:49:12 PM    Final     Scheduled Meds:    aspirin EC  325 mg Oral Daily   citalopram  20 mg Oral Daily   clopidogrel  75 mg Oral Daily   midodrine  10 mg Oral TID WC  rosuvastatin  10 mg Oral QHS   sodium chloride flush  3 mL Intravenous Q12H    Continuous Infusions:    sodium chloride     [START ON 03/31/2021] cefUROXime (ZINACEF)  IV       LOS: 2 days     Vernell Leep, MD,  FACP, [facility], [facility], [facility] (Care Management Physician Certified) [place]  To contact the attending provider between 7A-7P or the covering provider during after hours 7P-7A, please log into the web site [email or web address] and access using universal [place] password for that web site. If you do not have the password, please call the hospital operator.  03/30/2021, 11:18 AM

## 2021-03-30 NOTE — Progress Notes (Addendum)
° ° °  TIA with aphasia symptoms and >70% left ICA stenosis Symptoms have resolved NPO past MN with plans for TCAR 03/31/21 Dr. Virl Cagey Please continue ASA and Plavix  Roxy Horseman PA-C [facility]   Daily Progress Note  S/p: L hemispheric TIA with imaging demonstrating 70% ICA stenosis  Subjective: Pt doing well on exam, no complaints, no new symptoms  Objective: Vitals:   03/30/21 0420 03/30/21 0755  BP: (!) 101/54 (!) 122/58  Pulse: 61 (!) 59  Resp: 20   Temp: 99 F (37.2 C)   SpO2: 97% 98%    Physical Examination Physical Exam Vitals reviewed.  Constitutional:      General: He is not in acute distress.    Appearance: Normal appearance. He is normal weight.  HENT:     Head: Normocephalic.     Nose: No congestion.  Eyes:     Pupils: Pupils are equal, round, and reactive to light.  Cardiovascular:     Rate and Rhythm: Normal rate.     Pulses: Normal pulses.  Pulmonary:     Effort: Pulmonary effort is normal. No respiratory distress.     Breath sounds: Normal breath sounds.  Abdominal:     General: There is no distension.  Musculoskeletal:        General: No swelling or deformity.  Skin:    Findings: No bruising.  Neurological:     General: No focal deficit present.     Mental Status: He is alert and oriented to person, place, and time.  Psychiatric:        Mood and Affect: Mood normal.     ASSESSMENT/PLAN:  Patient scheduled for left sided transcarotid artery revascularization tomorrow with me We discussed the risks and benefits of surgery, and we elected to proceed N.p.o. midnight please, CBC, BMP Continue ASA, Plavix   Cassandria Santee MD MS [facility] [phone] 03/30/2021  11:01 AM

## 2021-03-31 ENCOUNTER — Inpatient Hospital Stay (HOSPITAL_COMMUNITY): Payer: Medicare Other

## 2021-03-31 ENCOUNTER — Encounter (HOSPITAL_COMMUNITY)
Admission: EM | Disposition: A | Discharge status: Home / Self Care | Payer: Self-pay | Source: Home / Self Care | Attending: Internal Medicine

## 2021-03-31 ENCOUNTER — Encounter (HOSPITAL_COMMUNITY): Payer: Self-pay | Admitting: Family Medicine

## 2021-03-31 ENCOUNTER — Inpatient Hospital Stay (HOSPITAL_COMMUNITY): Payer: Medicare Other | Admitting: Certified Registered"

## 2021-03-31 DIAGNOSIS — Z006 Encounter for examination for normal comparison and control in clinical research program: Secondary | ICD-10-CM

## 2021-03-31 DIAGNOSIS — G459 Transient cerebral ischemic attack, unspecified: Secondary | ICD-10-CM

## 2021-03-31 DIAGNOSIS — I6522 Occlusion and stenosis of left carotid artery: Secondary | ICD-10-CM

## 2021-03-31 HISTORY — PX: ULTRASOUND GUIDANCE FOR VASCULAR ACCESS: SHX6516

## 2021-03-31 HISTORY — PX: TRANSCAROTID ARTERY REVASCULARIZATIONÂ: SHX6778

## 2021-03-31 LAB — BASIC METABOLIC PANEL
Anion gap: 6 (ref 5–15)
BUN: 9 mg/dL (ref 8–23)
CO2: 27 mmol/L (ref 22–32)
Calcium: 8.4 mg/dL — ABNORMAL LOW (ref 8.9–10.3)
Chloride: 101 mmol/L (ref 98–111)
Creatinine, Ser: 0.76 mg/dL (ref 0.61–1.24)
GFR, Estimated: >60 mL/min (ref >60)
Glucose, Bld: 107 mg/dL — ABNORMAL HIGH (ref 70–99)
Potassium: 3.8 mmol/L (ref 3.5–5.1)
Sodium: 134 mmol/L — ABNORMAL LOW (ref 135–145)

## 2021-03-31 LAB — CBC
HCT: 35.1 % — ABNORMAL LOW (ref 39.0–52.0)
HCT: 28.7 % — ABNORMAL LOW (ref 39.0–52.0)
Hemoglobin: 11.7 g/dL — ABNORMAL LOW (ref 13.0–17.0)
Hemoglobin: 9.3 g/dL — ABNORMAL LOW (ref 13.0–17.0)
MCH: 32.7 pg (ref 26.0–34.0)
MCH: 32.6 pg (ref 26.0–34.0)
MCHC: 33.3 g/dL (ref 30.0–36.0)
MCHC: 32.4 g/dL (ref 30.0–36.0)
MCV: 98 fL (ref 80.0–100.0)
MCV: 100.7 fL — ABNORMAL HIGH (ref 80.0–100.0)
Platelets: 185 10*3/uL (ref 150–400)
Platelets: 157 10*3/uL (ref 150–400)
RBC: 3.58 MIL/uL — ABNORMAL LOW (ref 4.22–5.81)
RBC: 2.85 MIL/uL — ABNORMAL LOW (ref 4.22–5.81)
RDW: 13 % (ref 11.5–15.5)
RDW: 13.1 % (ref 11.5–15.5)
WBC: 5.7 10*3/uL (ref 4.0–10.5)
WBC: 4.6 10*3/uL (ref 4.0–10.5)
nRBC: 0 % (ref 0.0–0.2)
nRBC: 0 % (ref 0.0–0.2)

## 2021-03-31 LAB — URINE CULTURE: Culture: <10000 — AB

## 2021-03-31 LAB — SURGICAL PCR SCREEN
MRSA, PCR: NEGATIVE
Staphylococcus aureus: NEGATIVE

## 2021-03-31 LAB — CREATININE, SERUM
Creatinine, Ser: 0.61 mg/dL (ref 0.61–1.24)
GFR, Estimated: >60 mL/min (ref >60)

## 2021-03-31 SURGERY — TRANSCAROTID ARTERY REVASCULARIZATION (TCAR)
Anesthesia: General | Site: Neck | Laterality: Right

## 2021-03-31 MED ORDER — ALBUMIN HUMAN 5 % IV SOLN
12.5000 g | Freq: Once | INTRAVENOUS | Status: AC
  Administered 2021-03-31: 14:00:00 12.5 g via INTRAVENOUS

## 2021-03-31 MED ORDER — ROCURONIUM BROMIDE 10 MG/ML (PF) SYRINGE
PREFILLED_SYRINGE | INTRAVENOUS | Status: DC | PRN
  Administered 2021-03-31: 50 mg via INTRAVENOUS

## 2021-03-31 MED ORDER — HEPARIN SODIUM (PORCINE) 1000 UNIT/ML IJ SOLN
INTRAMUSCULAR | Status: AC
  Filled 2021-03-31: qty 1

## 2021-03-31 MED ORDER — ALBUMIN HUMAN 5 % IV SOLN
INTRAVENOUS | Status: AC
  Filled 2021-03-31: qty 250

## 2021-03-31 MED ORDER — LIDOCAINE HCL (PF) 1 % IJ SOLN
INTRAMUSCULAR | Status: AC
  Filled 2021-03-31: qty 30

## 2021-03-31 MED ORDER — GLYCOPYRROLATE PF 0.2 MG/ML IJ SOSY
PREFILLED_SYRINGE | INTRAMUSCULAR | Status: AC
  Filled 2021-03-31: qty 1

## 2021-03-31 MED ORDER — FENTANYL CITRATE (PF) 100 MCG/2ML IJ SOLN
25.0000 ug | INTRAMUSCULAR | Status: DC | PRN
  Administered 2021-03-31: 15:00:00 25 ug via INTRAVENOUS

## 2021-03-31 MED ORDER — HEPARIN 6000 UNIT IRRIGATION SOLUTION
Status: DC | PRN
  Administered 2021-03-31: 1

## 2021-03-31 MED ORDER — LACTATED RINGERS IV SOLN: INTRAVENOUS | Status: DC | PRN

## 2021-03-31 MED ORDER — LABETALOL HCL 5 MG/ML IV SOLN: 10.0000 mg | INTRAVENOUS | Status: DC | PRN

## 2021-03-31 MED ORDER — GLYCOPYRROLATE PF 0.2 MG/ML IJ SOSY
PREFILLED_SYRINGE | INTRAMUSCULAR | Status: DC | PRN
  Administered 2021-03-31: .2 mg via INTRAVENOUS

## 2021-03-31 MED ORDER — PHENYLEPHRINE 40 MCG/ML (10ML) SYRINGE FOR IV PUSH (FOR BLOOD PRESSURE SUPPORT)
PREFILLED_SYRINGE | INTRAVENOUS | Status: DC | PRN
  Administered 2021-03-31: 120 ug via INTRAVENOUS
  Administered 2021-03-31 (×2): 80 ug via INTRAVENOUS

## 2021-03-31 MED ORDER — LIDOCAINE 2% (20 MG/ML) 5 ML SYRINGE
INTRAMUSCULAR | Status: AC
  Filled 2021-03-31: qty 5

## 2021-03-31 MED ORDER — SODIUM CHLORIDE 0.9 % IV SOLN: INTRAVENOUS | Status: DC

## 2021-03-31 MED ORDER — MAGNESIUM SULFATE 2 GM/50ML IV SOLN: 2.0000 g | Freq: Every day | INTRAVENOUS | Status: DC | PRN

## 2021-03-31 MED ORDER — HEPARIN 6000 UNIT IRRIGATION SOLUTION
Status: AC
  Filled 2021-03-31: qty 500

## 2021-03-31 MED ORDER — PHENYLEPHRINE 40 MCG/ML (10ML) SYRINGE FOR IV PUSH (FOR BLOOD PRESSURE SUPPORT)
PREFILLED_SYRINGE | INTRAVENOUS | Status: AC
  Filled 2021-03-31: qty 10

## 2021-03-31 MED ORDER — SUCCINYLCHOLINE CHLORIDE 200 MG/10ML IV SOSY
PREFILLED_SYRINGE | INTRAVENOUS | Status: DC | PRN
  Administered 2021-03-31: 100 mg via INTRAVENOUS

## 2021-03-31 MED ORDER — SENNOSIDES-DOCUSATE SODIUM 8.6-50 MG PO TABS: 1.0000 | ORAL_TABLET | Freq: Every evening | ORAL | Status: DC | PRN

## 2021-03-31 MED ORDER — LIDOCAINE 2% (20 MG/ML) 5 ML SYRINGE
INTRAMUSCULAR | Status: DC | PRN
  Administered 2021-03-31: 60 mg via INTRAVENOUS

## 2021-03-31 MED ORDER — PANTOPRAZOLE SODIUM 40 MG PO TBEC
40.0000 mg | DELAYED_RELEASE_TABLET | Freq: Every day | ORAL | Status: DC
  Administered 2021-04-01: 09:00:00 40 mg via ORAL
  Filled 2021-03-31: qty 1

## 2021-03-31 MED ORDER — DOCUSATE SODIUM 100 MG PO CAPS
100.0000 mg | ORAL_CAPSULE | Freq: Every day | ORAL | Status: DC
  Administered 2021-04-01: 09:00:00 100 mg via ORAL
  Filled 2021-03-31: qty 1

## 2021-03-31 MED ORDER — GUAIFENESIN-DM 100-10 MG/5ML PO SYRP: 15.0000 mL | ORAL_SOLUTION | ORAL | Status: DC | PRN

## 2021-03-31 MED ORDER — PROTAMINE SULFATE 10 MG/ML IV SOLN
INTRAVENOUS | Status: AC
  Filled 2021-03-31: qty 5

## 2021-03-31 MED ORDER — PHENYLEPHRINE HCL-NACL 20-0.9 MG/250ML-% IV SOLN
INTRAVENOUS | Status: DC | PRN
  Administered 2021-03-31: 50 ug/min via INTRAVENOUS

## 2021-03-31 MED ORDER — PROTAMINE SULFATE 10 MG/ML IV SOLN
INTRAVENOUS | Status: DC | PRN
  Administered 2021-03-31 (×3): 10 mg via INTRAVENOUS
  Administered 2021-03-31: 20 mg via INTRAVENOUS

## 2021-03-31 MED ORDER — LIDOCAINE HCL (PF) 1 % IJ SOLN
INTRAMUSCULAR | Status: AC
  Filled 2021-03-31: qty 5

## 2021-03-31 MED ORDER — PROPOFOL 10 MG/ML IV BOLUS
INTRAVENOUS | Status: DC | PRN
  Administered 2021-03-31: 100 mg via INTRAVENOUS
  Administered 2021-03-31: 20 mg via INTRAVENOUS
  Administered 2021-03-31 (×4): 30 mg via INTRAVENOUS
  Administered 2021-03-31: 20 mg via INTRAVENOUS
  Administered 2021-03-31: 40 mg via INTRAVENOUS

## 2021-03-31 MED ORDER — EPHEDRINE SULFATE-NACL 50-0.9 MG/10ML-% IV SOSY
PREFILLED_SYRINGE | INTRAVENOUS | Status: DC | PRN
  Administered 2021-03-31 (×2): 10 mg via INTRAVENOUS
  Administered 2021-03-31: 15 mg via INTRAVENOUS

## 2021-03-31 MED ORDER — BISACODYL 5 MG PO TBEC: 5.0000 mg | DELAYED_RELEASE_TABLET | Freq: Every day | ORAL | Status: DC | PRN

## 2021-03-31 MED ORDER — CHLORHEXIDINE GLUCONATE 0.12 % MT SOLN
OROMUCOSAL | Status: AC
  Administered 2021-03-31: 10:00:00 15 mL
  Filled 2021-03-31: qty 15

## 2021-03-31 MED ORDER — 0.9 % SODIUM CHLORIDE (POUR BTL) OPTIME
TOPICAL | Status: DC | PRN
  Administered 2021-03-31: 12:00:00 1000 mL

## 2021-03-31 MED ORDER — HYDROMORPHONE HCL 1 MG/ML IJ SOLN
0.5000 mg | INTRAMUSCULAR | Status: DC | PRN
  Administered 2021-03-31: 18:00:00 1 mg via INTRAVENOUS
  Filled 2021-03-31: qty 1

## 2021-03-31 MED ORDER — ONDANSETRON HCL 4 MG/2ML IJ SOLN
INTRAMUSCULAR | Status: AC
  Filled 2021-03-31: qty 2

## 2021-03-31 MED ORDER — EPHEDRINE 5 MG/ML INJ
INTRAVENOUS | Status: AC
  Filled 2021-03-31: qty 10

## 2021-03-31 MED ORDER — HYDRALAZINE HCL 20 MG/ML IJ SOLN: 5.0000 mg | INTRAMUSCULAR | Status: DC | PRN

## 2021-03-31 MED ORDER — ONDANSETRON HCL 4 MG/2ML IJ SOLN: 4.0000 mg | Freq: Four times a day (QID) | INTRAMUSCULAR | Status: DC | PRN

## 2021-03-31 MED ORDER — CEFAZOLIN SODIUM-DEXTROSE 2-4 GM/100ML-% IV SOLN
2.0000 g | Freq: Three times a day (TID) | INTRAVENOUS | Status: AC
  Administered 2021-03-31 – 2021-04-01 (×2): 2 g via INTRAVENOUS
  Filled 2021-03-31 (×2): qty 100

## 2021-03-31 MED ORDER — FENTANYL CITRATE (PF) 250 MCG/5ML IJ SOLN
INTRAMUSCULAR | Status: AC
  Filled 2021-03-31: qty 5

## 2021-03-31 MED ORDER — HEMOSTATIC AGENTS (NO CHARGE) OPTIME
TOPICAL | Status: DC | PRN
  Administered 2021-03-31: 1 via TOPICAL

## 2021-03-31 MED ORDER — IOHEXOL 300 MG/ML  SOLN
INTRAMUSCULAR | Status: DC | PRN
  Administered 2021-03-31: 13:00:00 30 mL

## 2021-03-31 MED ORDER — PROPOFOL 10 MG/ML IV BOLUS
INTRAVENOUS | Status: AC
  Filled 2021-03-31: qty 20

## 2021-03-31 MED ORDER — OXYCODONE-ACETAMINOPHEN 5-325 MG PO TABS: 1.0000 | ORAL_TABLET | ORAL | Status: DC | PRN

## 2021-03-31 MED ORDER — SODIUM CHLORIDE 0.9 % IV SOLN
0.1500 ug/kg/min | INTRAVENOUS | Status: DC
  Filled 2021-03-31: qty 2000

## 2021-03-31 MED ORDER — ACETAMINOPHEN 500 MG PO TABS
1000.0000 mg | ORAL_TABLET | Freq: Once | ORAL | Status: AC
  Administered 2021-03-31: 10:00:00 1000 mg via ORAL
  Filled 2021-03-31: qty 2

## 2021-03-31 MED ORDER — METOPROLOL TARTRATE 5 MG/5ML IV SOLN
2.0000 mg | INTRAVENOUS | Status: DC | PRN
Start: 2021-03-31 — End: 2021-04-01

## 2021-03-31 MED ORDER — GLYCOPYRROLATE PF 0.2 MG/ML IJ SOSY
PREFILLED_SYRINGE | INTRAMUSCULAR | Status: DC | PRN
  Administered 2021-03-31: 12:00:00 .2 mg via INTRAVENOUS

## 2021-03-31 MED ORDER — ONDANSETRON HCL 4 MG/2ML IJ SOLN
INTRAMUSCULAR | Status: DC | PRN
  Administered 2021-03-31: 4 mg via INTRAVENOUS

## 2021-03-31 MED ORDER — SUCCINYLCHOLINE CHLORIDE 200 MG/10ML IV SOSY
PREFILLED_SYRINGE | INTRAVENOUS | Status: AC
  Filled 2021-03-31: qty 10

## 2021-03-31 MED ORDER — REMIFENTANIL HCL 2 MG IV SOLR
INTRAVENOUS | Status: DC | PRN
  Administered 2021-03-31: .1 ug/kg/min via INTRAVENOUS

## 2021-03-31 MED ORDER — POTASSIUM CHLORIDE CRYS ER 20 MEQ PO TBCR
20.0000 meq | EXTENDED_RELEASE_TABLET | Freq: Every day | ORAL | Status: DC | PRN
Start: 2021-03-31 — End: 2021-04-01

## 2021-03-31 MED ORDER — EPHEDRINE 5 MG/ML INJ
INTRAVENOUS | Status: AC
  Filled 2021-03-31: qty 5

## 2021-03-31 MED ORDER — FENTANYL CITRATE (PF) 100 MCG/2ML IJ SOLN
INTRAMUSCULAR | Status: AC
  Filled 2021-03-31: qty 2

## 2021-03-31 MED ORDER — ALUM & MAG HYDROXIDE-SIMETH 200-200-20 MG/5ML PO SUSP: 15.0000 mL | ORAL | Status: DC | PRN

## 2021-03-31 MED ORDER — SODIUM CHLORIDE 0.9 % IV SOLN: 500.0000 mL | Freq: Once | INTRAVENOUS | Status: DC | PRN

## 2021-03-31 MED ORDER — HEPARIN SODIUM (PORCINE) 1000 UNIT/ML IJ SOLN
INTRAMUSCULAR | Status: DC | PRN
  Administered 2021-03-31: 7000 [IU] via INTRAVENOUS
  Administered 2021-03-31: 3000 [IU] via INTRAVENOUS

## 2021-03-31 MED ORDER — PHENOL 1.4 % MT LIQD: 1.0000 | OROMUCOSAL | Status: DC | PRN

## 2021-03-31 MED ORDER — HEPARIN SODIUM (PORCINE) 5000 UNIT/ML IJ SOLN
5000.0000 [IU] | Freq: Three times a day (TID) | INTRAMUSCULAR | Status: DC
  Administered 2021-03-31 – 2021-04-01 (×2): 5000 [IU] via SUBCUTANEOUS
  Filled 2021-03-31 (×2): qty 1

## 2021-03-31 MED ORDER — SUGAMMADEX SODIUM 200 MG/2ML IV SOLN
INTRAVENOUS | Status: DC | PRN
  Administered 2021-03-31: 200 mg via INTRAVENOUS

## 2021-03-31 MED ORDER — FENTANYL CITRATE (PF) 100 MCG/2ML IJ SOLN
INTRAMUSCULAR | Status: DC | PRN
  Administered 2021-03-31 (×2): 50 ug via INTRAVENOUS

## 2021-03-31 SURGICAL SUPPLY — 67 items
ADH SKN CLS APL DERMABOND .7 (GAUZE/BANDAGES/DRESSINGS) ×2
BAG BANDED W/RUBBER/TAPE 36X54 (MISCELLANEOUS) ×4 IMPLANT
BAG COUNTER SPONGE SURGICOUNT (BAG) ×3 IMPLANT
BAG EQP BAND 135X91 W/RBR TAPE (MISCELLANEOUS) ×2
BAG SPNG CNTER NS LX DISP (BAG) ×2
BAG SURGICOUNT SPONGE COUNTING (BAG) ×1
BALLN STERLING RX 5X30X80 (BALLOONS) ×8
BALLOON STERLING RX 5X30X80 (BALLOONS) IMPLANT
CANISTER SUCT 3000ML PPV (MISCELLANEOUS) ×4 IMPLANT
CATH BEACON 5 .035 40 KMP TP (CATHETERS) IMPLANT
CATH BEACON 5 .038 40 KMP TP (CATHETERS) ×4
CATH CXI 2.6F 65 ANG (CATHETERS) ×4
CATH ROBINSON RED A/P 18FR (CATHETERS) IMPLANT
CATH SPRT ANG 65X2.3FR PLATN (CATHETERS) IMPLANT
CLIP LIGATING EXTRA MED SLVR (CLIP) ×2 IMPLANT
CLIP LIGATING EXTRA SM BLUE (MISCELLANEOUS) ×2 IMPLANT
COVER DOME SNAP 22 D (MISCELLANEOUS) ×4 IMPLANT
COVER PROBE W GEL 5X96 (DRAPES) ×6 IMPLANT
DERMABOND ADVANCED (GAUZE/BANDAGES/DRESSINGS) ×2
DERMABOND ADVANCED .7 DNX12 (GAUZE/BANDAGES/DRESSINGS) ×2 IMPLANT
DEVICE TORQUE H2O (MISCELLANEOUS) ×2 IMPLANT
DRAPE FEMORAL ANGIO 80X135IN (DRAPES) ×4 IMPLANT
ELECT REM PT RETURN 9FT ADLT (ELECTROSURGICAL) ×4
ELECTRODE REM PT RTRN 9FT ADLT (ELECTROSURGICAL) ×2 IMPLANT
GAUZE 4X4 16PLY ~~LOC~~+RFID DBL (SPONGE) ×2 IMPLANT
GLOVE SRG 8 PF TXTR STRL LF DI (GLOVE) ×2 IMPLANT
GLOVE SURG MICRO LTX SZ7.5 (GLOVE) ×2 IMPLANT
GLOVE SURG UNDER POLY LF SZ8 (GLOVE) ×4
GOWN STRL REUS W/ TWL LRG LVL3 (GOWN DISPOSABLE) ×4 IMPLANT
GOWN STRL REUS W/TWL 2XL LVL3 (GOWN DISPOSABLE) ×4 IMPLANT
GOWN STRL REUS W/TWL LRG LVL3 (GOWN DISPOSABLE) ×8
GUIDEWIRE ENROUTE 0.014 (WIRE) ×4 IMPLANT
GUIDEWIRE STR TIP .014X300X8 (WIRE) ×2 IMPLANT
GUIDEWIRE ZILIENT 12G 014X300 (WIRE) ×2 IMPLANT
HEMOSTAT SNOW SURGICEL 2X4 (HEMOSTASIS) ×2 IMPLANT
INTRODUCER KIT GALT 7CM (INTRODUCER) ×4
KIT BASIN OR (CUSTOM PROCEDURE TRAY) ×4 IMPLANT
KIT ENCORE 26 ADVANTAGE (KITS) ×4 IMPLANT
KIT INTRODUCER GALT 7 (INTRODUCER) ×2 IMPLANT
KIT TURNOVER KIT B (KITS) ×4 IMPLANT
NDL HYPO 25GX1X1/2 BEV (NEEDLE) IMPLANT
NEEDLE HYPO 25GX1X1/2 BEV (NEEDLE) IMPLANT
PACK CAROTID (CUSTOM PROCEDURE TRAY) ×4 IMPLANT
POSITIONER HEAD DONUT 9IN (MISCELLANEOUS) ×4 IMPLANT
PROTECTION STATION PRESSURIZED (MISCELLANEOUS) ×4
SET MICROPUNCTURE 5F STIFF (MISCELLANEOUS) ×4 IMPLANT
SPONGE T-LAP 18X18 ~~LOC~~+RFID (SPONGE) ×2 IMPLANT
STATION PROTECTION PRESSURIZED (MISCELLANEOUS) ×2 IMPLANT
STENT TRANSCAROTID SYS 10X40 (Permanent Stent) ×2 IMPLANT
STENT TRANSCAROTID SYSTEM 9X40 (Permanent Stent) ×2 IMPLANT
SUT MNCRL AB 4-0 PS2 18 (SUTURE) ×4 IMPLANT
SUT PROLENE 5 0 C 1 24 (SUTURE) ×6 IMPLANT
SUT SILK 2 0 PERMA HAND 18 BK (SUTURE) IMPLANT
SUT SILK 2 0 SH (SUTURE) ×4 IMPLANT
SUT SILK 3 0 (SUTURE)
SUT SILK 3-0 18XBRD TIE 12 (SUTURE) IMPLANT
SUT VIC AB 3-0 SH 27 (SUTURE) ×4
SUT VIC AB 3-0 SH 27X BRD (SUTURE) ×2 IMPLANT
SYR 10ML LL (SYRINGE) ×12 IMPLANT
SYR 20ML LL LF (SYRINGE) ×4 IMPLANT
SYR CONTROL 10ML LL (SYRINGE) IMPLANT
SYSTEM TRANSCAROTID NEUROPRTCT (MISCELLANEOUS) ×2 IMPLANT
TOWEL GREEN STERILE (TOWEL DISPOSABLE) ×4 IMPLANT
TRANSCAROTID NEUROPROTECT SYS (MISCELLANEOUS) ×4
WATER STERILE IRR 1000ML POUR (IV SOLUTION) ×4 IMPLANT
WIRE BENTSON .035X145CM (WIRE) ×4 IMPLANT
WIRE SPARTACORE .014X300CM (WIRE) ×2 IMPLANT

## 2021-03-31 NOTE — Op Note (Addendum)
NAME: Steven [name]    MRN: [id number] DOB: 09-Jan-1935    DATE OF OPERATION: 03/31/2021  PREOP DIAGNOSIS:    Symptomatic left ICA stenosis  POSTOP DIAGNOSIS:    Same  PROCEDURE:    Left Transcarotid Artery Revascularization (TCAR)  SURGEON: Broadus John  ASSIST: Lovie Macadamia  ANESTHESIA: General   EBL: 28ml  INDICATIONS:    CLEARANCE [name] is a 86 y.o. male who presented to the hospital with aphasia, and later diagnosed with left sided TIA.  CT imaging demonstrated 70% stenosis of the left internal carotid artery.  After discussing the risks and benefits of left-sided transcarotid artery revascularization to decrease the risk of further stroke, Akil elected to proceed.  FINDINGS:   Greater than 70% stenosis of the left internal carotid artery  TECHNIQUE:   The patient was brought to the operating room, where support lines were placed and general anesthesia was secured.  The patient was brought to the operating room and laid in the supine position.  General anesthesia was induced and the patient was prepped and draped in standard fashion.  Patient was noted to have significant cervical hardware with inability to extend or laterally rotate his head.    The left neck and right groin were prepped and the patient was sterilely draped. A transverse 2-4 cm incision was made between the sternal and clavicular heads of the sternocleidomastoid muscle, below the omohyoid. Following longitudinal division of the carotid sheath the jugular vein was partially dissected and retracted medially. Once 3 cm of common carotid artery (CCA) were isolated, umbilical tape was placed around the proximal 1/3 of the CCA under direct vision. A 5.0 polypropylene suture was pre-placed in the anterior wall of the CCA, in a U stitch configuration, close to the clavicle to facilitate hemostasis upon removal of the arterial sheath at completion of the TCAR procedure.  The contralateral (left)  common femoral vein (CFV) was accessed under ultrasound guidance, using standard Seldinger and micropuncture access technique. Permanent recorded image(s) was/were saved in the patient's medical record. The Venous Return Sheath was advanced into the CFV over the 0.035 wire provided. Blood was aspirated from the flow line followed by flushing of the Venous Sheath with heparinized saline. The Venous Sheath was secured to the patients skin with suture to maintain optimal position in the vessel.  Heparin was given to obtain a therapeutic activated clotting time >250 seconds prior to arterial access. A 4-French non-stiffened ENHANCE Transcarotid / Peripheral Access set was used, puncturing the artery with the 21G needle through the pre-placed U stitch while holding gentle traction on the umbilical tape to stabilize and centralize the CCA within the incision. Careful attention was paid to the change in CCA shape when using the umbilical tape to control or lift the artery. The micropuncture wire was then advanced 3-4 cm into the CCA and, the 21G needle was removed. The micropuncture sheath was advanced 2-3 cm into the CCA and the wire and dilator were removed. Pulsatile backflow indicated correct positioning. The provided 0.035" J-tipped guidewire was inserted as close as possible to the bifurcation without engaging the lesion. After micropuncture sheath removal, the Transcarotid Arterial Sheath was advanced to the 2.5cm marker and the 0.035 wire and dilator were then removed. Arterial Sheath position was assessed under fluoroscopy in two projections to ensure that the sheath tip was oriented coaxially in the CCA. The Arterial Sheath was sutured to the patient with gentle forward tension. Blood was slowly aspirated  followed by flushing with heparinized saline. No ingress of air bubbles through the passive hemostatic valve was observed. The stopcocks were closed. Traction applied to the CCA previously to facilitate  access was gently released.  The Flow Controller was connected to the Transcarotid Arterial Sheath, prepared by passively allowing a column of arterial blood to fill the line and connected to the Venous Return Sheath. CCA inflow was occluded proximal to the arteriotomy with a vascular clamp to achieve active flow reversal. To confirm flow reversal, a saline bolus was delivered into the venous flow line on both High and Low flow settings of the Flow Controller. Angiograms were performed with slow injections of a small amount of contrast filling just past the lesion to minimize antegrade transmission of micro-bubbles.  Prior to lesion manipulation, heart rate (65KCL) and systolic BP (275-170YFVC) were managed upwards to optimize flow reversal and procedural neuroprotection.  Contrast angiography was used to provide a roadmap of the cerebral vessels.  Imaging demonstrated a dissection within the common carotid artery from sheath insertion.  This did not propagate into the internal carotid artery.  After several attempts of regaining access to true lumen, the sheath was backed out roughly 1.5 cm which allowed for true lumen cannulation and wire access to the internal carotid artery.  The lesion was crossed with an 0.014 ENROUTE guidewire and pre-dilation of the lesion was performed with a 27mm x 56mm rapid exchange 0.014 compatible balloon catheter to 8 atmospheres for 10 seconds. Stenting was performed with an 45mm x 69mm ENROUTE Transcarotid stent, sized appropriately to the right CCA.  A 10 mm x 40 mm in route shows carotid stent was opened and laid more proximally to cover the common carotid dissection.  Follow-up angiogram demonstrated excellent result with resolution of the dissection. Residual internal carotid artery stenosis was less than 40%. I chose not to post-balloon plasty.  The angiogram also demonstrated an air bubble that was appreciated traveling from the access into the internal and  external carotid arteries.  Fortunately, this was appreciated and flow reversal reestablished prior to the air bubble reaching the brain.  The last image in series demonstrates reversal of the bubble due to TCAR flow reversal.  At [facility] case completion, antegrade flow was restored by releasing the clamp on the CCA then closing the NPS stopcocks to the flow lines. The Transcarotid Arterial Sheath was removed and the pre-closure suture was tied. Heparin reversal was employed. An ultrasound was brought onto the field and the left carotid was assessed at the access site. There was no residual dissection appreciated.  The Venous Return Sheath was removed and hemostasis was achieved with brief manual compression.  The patient tolerated the procedure well and was extubated on the table. The patient was moving all four extremities to command prior to transfer to the recovery room.  Given the complexity of the case a first assistant was necessary in order to expedient the procedure and safely perform the technical aspects of the operation.  Macie Burows, MD [facility]  DATE OF DICTATION:   03/31/2021

## 2021-03-31 NOTE — Progress Notes (Signed)
Patient received from PACU post TCAR. He is alert and oriented, NIH score is 0. Left neck incision with skin glue level 0. Right groing level 0. VS WNL. 1mg  PRN dilaudid given for pain. Currently resting in bed in no acute distress. Fuller Canada, RN

## 2021-03-31 NOTE — Progress Notes (Signed)
PROGRESS NOTE   Steven [name]  OBS:[id number]    DOB: [birth year]    DOA: 03/27/2021  PCP: [name], MD   I have briefly reviewed patients previous medical records in [facility].  Chief complaint: Speech difficulty   Brief Narrative:  86 year old male with medical history significant for hyperlipidemia, ankylosing spondylitis, anxiety and depression, presented to ED on 03/27/2021 with speech difficulty which lasted for approximately 6 hours on day of admission.  Admitted for acute ischemic stroke suspected due to symptomatic left ICA stenosis.  Neurology/stroke MD and vascular surgery were consulted.  Stroke work-up was completed.  Vascular surgery plans TCAR (plans carotid artery revascularization) on 03/31/2021.  Issues with hypotension/orthostatic hypotension, treated with IV fluids and midodrine.  Improved.   Assessment & Plan:  Principal Problem:   TIA (transient ischemic attack) Active Problems:   Hyperlipidemia   History of ankylosing spondylitis   CVA (cerebral vascular accident) ([place])   Stenosis of left carotid artery   Acute ischemic stroke (posterior left frontal lobe) secondary to symptomatic left ICA stenosis: -CT head 12/15: No acute intracranial abnormality. -MRI brain: Punctate focus of restricted diffusion in the posterior left frontal lobe, which may represent acute or subacute infarct. -CTA head and neck: Approximately 70% stenosis of the proximal left ICA.  No other hemodynamically significant stenosis in the neck.  No intracranial large vessel occlusion or significant stenosis. -2D echo: LVEF 60 to 65%.  No aortic stenosis is present. -A1c 6.4.  LDL 66. -PT recommends outpatient PT versus no follow-up.  OT and SLP have no follow-up needs -Not on antiplatelets PTA.  Now on DAPT with aspirin 325 Mg daily + Plavix 75 Mg daily.  Continue statins. -Neurology consultation appreciated.   - Vascular surgery consulted for management of left ICA  stenosis.  Symptomatic left ICA stenosis -Vascular surgery consultation appreciated and plan TCAR on 12/19  -Patient seen prior to procedure.  Hypotension/orthostatic hypotension -Had ongoing issues with asymptomatic hypotension/orthostatic hypotension since admission despite IV fluids.  Initiated midodrine 10 Mg 3 times daily.  Blood pressures have improved but still fluctuating at times to low.  Continue to monitor carefully. -Added bilateral knee-high compression stockings. -Avoid hypotension given left ICA stenosis and risk of worsening stroke. -Monitor postprocedure and need to determine if he really needs to be discharged on midodrine postprocedure or not.  Hyperlipidemia - Continue Crestor.  Ankylosing spondylitis -On weekly methotrexate and daily folic acid supplements for same.  Anxiety and depression -Does not appear to be on any medications for this.  Macrocytic anemia -May be related to anemia of chronic disease versus other etiology.  Stable.  Outpatient follow-up.  Body mass index is 22.05 kg/m.    DVT prophylaxis: Place TED hose Start: 03/30/21 1122 SCD's Start: 03/27/21 2142     Code Status: Full Code Family Communication: spouse at bedside. Disposition:  Status is: Inpatient     Consultants:   Neurology Vascular surgery  Procedures:   None  Antimicrobials:   None   Subjective:  Seen as he was heading for procedure.  No complaints reported.  No dizziness or lightheadedness or new strokelike symptoms.  Objective:   Vitals:   03/30/21 2300 03/31/21 0417 03/31/21 0754 03/31/21 0949  BP: 136/64 130/61 (!) 115/50 (!) 98/54  Pulse: (!) 57 (!) 59 (!) 55 (!) 57  Resp:  20 16 18   Temp: 97.9 F (36.6 C) 97.7 F (36.5 C) 98.2 F (36.8 C) 98.1 F (36.7 C)  TempSrc: Oral Oral Oral  Oral  SpO2: 98% 100% 100% 98%  Weight:    65.8 kg  Height:    5\' 8"  (1.727 m)    General exam: Elderly male, moderately built and thinly nourished sitting up  comfortably in reclining chair without distress. Respiratory system: Clear to auscultation.  No increased work of breathing Cardiovascular system: S1 and S2 heard, RRR.  No JVD, murmurs or pedal edema. Gastrointestinal system: Abdomen is nondistended, soft and nontender. No organomegaly or masses felt. Normal bowel sounds heard. Central nervous system: Alert and oriented. No focal neurological deficits.  No dysarthria or aphasia appreciated. Extremities: Symmetric 5 x 5 power. Skin: No rashes, lesions or ulcers Psychiatry: Judgement and insight appear normal. Mood & affect appropriate.     Data Reviewed:   I have personally reviewed following labs and imaging studies   CBC: Recent Labs  Lab 03/27/21 1248 03/27/21 1909 03/31/21 0418  WBC 6.5 6.4 5.7  NEUTROABS 5.1 5.0  --   HGB 11.3* 11.8* 11.7*  HCT 33.8* 35.3* 35.1*  MCV 98.5 101.1* 98.0  PLT 234 228 188    Basic Metabolic Panel: Recent Labs  Lab 03/27/21 1248 03/27/21 1909 03/31/21 0418  NA 138 136 134*  K 4.0 3.9 3.8  CL 103 101 101  CO2 27 27 27   GLUCOSE 106* 126* 107*  BUN 13 11 9   CREATININE 0.77 0.75 0.76  CALCIUM 9.0 9.0 8.4*    Liver Function Tests: Recent Labs  Lab 03/27/21 1248 03/27/21 1909  AST 18 20  ALT 18 18  ALKPHOS 56 48  BILITOT 0.9 0.6  PROT 7.4 7.1  ALBUMIN 3.8 3.8    CBG: Recent Labs  Lab 03/27/21 1903  GLUCAP 130*    Microbiology Studies:   Recent Results (from the past 240 hour(s))  Resp Panel by RT-PCR (Flu A&B, Covid) Nasopharyngeal Swab     Status: None   Collection Time: 03/27/21  7:09 PM   Specimen: Nasopharyngeal Swab; Nasopharyngeal(NP) swabs in vial transport medium  Result Value Ref Range Status   SARS Coronavirus 2 by RT PCR NEGATIVE NEGATIVE Final    Comment: (NOTE) SARS-CoV-2 target nucleic acids are NOT DETECTED.  The SARS-CoV-2 RNA is generally detectable in upper respiratory specimens during the acute phase of infection. The lowest concentration of  SARS-CoV-2 viral copies this assay can detect is 138 copies/mL. A negative result does not preclude SARS-Cov-2 infection and should not be used as the sole basis for treatment or other patient management decisions. A negative result may occur with  improper specimen collection/handling, submission of specimen other than nasopharyngeal swab, presence of viral mutation(s) within the areas targeted by this assay, and inadequate number of viral copies(<138 copies/mL). A negative result must be combined with clinical observations, patient history, and epidemiological information. The expected result is Negative.  Fact Sheet for Patients:  EntrepreneurPulse.com.au  Fact Sheet for Healthcare Providers:  IncredibleEmployment.be  This test is no t yet approved or cleared by the [place] FDA and  has been authorized for detection and/or diagnosis of SARS-CoV-2 by FDA under an Emergency Use Authorization (EUA). This EUA will remain  in effect (meaning this test can be used) for the duration of the COVID-19 declaration under Section 564(b)(1) of the Act, 21 U.S.C.section 360bbb-3(b)(1), unless the authorization is terminated  or revoked sooner.       Influenza A by PCR NEGATIVE NEGATIVE Final   Influenza B by PCR NEGATIVE NEGATIVE Final    Comment: (NOTE) The Xpert Xpress SARS-CoV-2/FLU/RSV plus  assay is intended as an aid in the diagnosis of influenza from Nasopharyngeal swab specimens and should not be used as a sole basis for treatment. Nasal washings and aspirates are unacceptable for Xpert Xpress SARS-CoV-2/FLU/RSV testing.  Fact Sheet for Patients: EntrepreneurPulse.com.au  Fact Sheet for Healthcare Providers: IncredibleEmployment.be  This test is not yet approved or cleared by the [place] FDA and has been authorized for detection and/or diagnosis of SARS-CoV-2 by FDA under an Emergency Use  Authorization (EUA). This EUA will remain in effect (meaning this test can be used) for the duration of the COVID-19 declaration under Section 564(b)(1) of the Act, 21 U.S.C. section 360bbb-3(b)(1), unless the authorization is terminated or revoked.  Performed at [facility], [place] [address], [place], [place] [address]   Surgical pcr screen     Status: None   Collection Time: 03/31/21  4:15 AM   Specimen: Nasal Mucosa; Nasal Swab  Result Value Ref Range Status   MRSA, PCR NEGATIVE NEGATIVE Final   Staphylococcus aureus NEGATIVE NEGATIVE Final    Comment: (NOTE) The Xpert SA Assay (FDA approved for NASAL specimens in patients 29 years of age and older), is one component of a comprehensive surveillance program. It is not intended to diagnose infection nor to guide or monitor treatment. Performed at [facility], [place] [address], [place],  [address]     Radiology Studies:  HYBRID OR IMAGING ([place])  Result Date: 03/31/2021 There is no interpretation for this exam.  This order is for images obtained during a surgical procedure.  Please See "Surgeries" Tab for more information regarding the procedure.    Scheduled Meds:    [MAR Hold] aspirin EC  325 mg Oral Daily   chlorhexidine       [MAR Hold] citalopram  20 mg Oral Daily   [MAR Hold] clopidogrel  75 mg Oral Daily   [MAR Hold] midodrine  10 mg Oral TID WC   [MAR Hold] rosuvastatin  10 mg Oral QHS   [MAR Hold] sodium chloride flush  3 mL Intravenous Q12H    Continuous Infusions:    [MAR Hold] sodium chloride     [MAR Hold] cefUROXime (ZINACEF)  IV     remifentanil (ULTIVA) 2 mg in 100 mL normal saline (20 mcg/mL) Optime       LOS: 3 days     Vernell Leep, MD,  FACP, [facility], [facility], [facility] (Care Management Physician Certified) [place]  To contact the attending provider between 7A-7P or the covering provider during after hours 7P-7A, please log into the web  site [email or web address] and access using universal [place] password for that web site. If you do not have the password, please call the hospital operator.  03/31/2021, 10:09 AM

## 2021-03-31 NOTE — Progress Notes (Signed)
° °  Daily Progress Note  S/p: L hemispheric TIA with imaging demonstrating 70% ICA stenosis  Subjective: Pt doing well on exam, no complaints, no new symptoms  Objective: Vitals:   03/31/21 0754 03/31/21 0949  BP: (!) 115/50 (!) 98/54  Pulse: (!) 55 (!) 57  Resp: 16 18  Temp: 98.2 F (36.8 C) 98.1 F (36.7 C)  SpO2: 100% 98%    Physical Examination Physical Exam Vitals reviewed.  Constitutional:      General: He is not in acute distress.    Appearance: Normal appearance. He is normal weight.  HENT:     Head: Normocephalic.     Nose: No congestion.  Eyes:     Pupils: Pupils are equal, round, and reactive to light.  Cardiovascular:     Rate and Rhythm: Normal rate.     Pulses: Normal pulses.  Pulmonary:     Effort: Pulmonary effort is normal. No respiratory distress.     Breath sounds: Normal breath sounds.  Abdominal:     General: There is no distension.  Musculoskeletal:        General: No swelling or deformity.  Skin:    Findings: No bruising.  Neurological:     General: No focal deficit present.     Mental Status: He is alert and oriented to person, place, and time.  Psychiatric:        Mood and Affect: Mood normal.     ASSESSMENT/PLAN:  Patient scheduled for left sided transcarotid artery revascularization today We discussed the risks and benefits of surgery, and we elected to proceed Continue ASA, Plavix   Cassandria Santee MD [facility] [phone] 03/31/2021  10:32 AM

## 2021-03-31 NOTE — Progress Notes (Signed)
Pt unable to urinate.  Bladder Scanner shows 891 cc.  Called Dr. Fransisco Beau.  OK to do in and out cath.  Procedure performed without difficulty.  850 cc clear yellow urine out.  Pt tolerated well.

## 2021-03-31 NOTE — Progress Notes (Signed)
STROKE TEAM PROGRESS NOTE   INTERVAL HISTORY Patient is seen in [place] in bed. TCAR procedure went well. Patient has no new complaints today.  .  Vital signs are stable.  Neurological exam is unchanged.    Vitals:   03/31/21 1455 03/31/21 1500 03/31/21 1515 03/31/21 1530  BP: (!) 95/49 (!) 95/49    Pulse: 60 61 61 (!) 58  Resp: 19 19 19 16   Temp:      TempSrc:      SpO2: 98% 99% 100% 98%  Weight:      Height:       CBC:  Recent Labs  Lab 03/27/21 1248 03/27/21 1248 03/27/21 1909 03/31/21 0418 03/31/21 1430  WBC 6.5   < > 6.4 5.7 4.6  NEUTROABS 5.1  --  5.0  --   --   HGB 11.3*   < > 11.8* 11.7* 9.3*  HCT 33.8*  --  35.3* 35.1* 28.7*  MCV 98.5  --  101.1* 98.0 100.7*  PLT 234   < > 228 185 157   < > = values in this interval not displayed.   Basic Metabolic Panel:  Recent Labs  Lab 03/27/21 1909 03/31/21 0418 03/31/21 1430  NA 136 134*  --   K 3.9 3.8  --   CL 101 101  --   CO2 27 27  --   GLUCOSE 126* 107*  --   BUN 11 9  --   CREATININE 0.75 0.76 0.61  CALCIUM 9.0 8.4*  --    Lipid Panel:  Recent Labs  Lab 03/28/21 0211  CHOL 150  TRIG 171*  HDL 50  CHOLHDL 3.0  VLDL 34  LDLCALC 66   HgbA1c:  Recent Labs  Lab 03/28/21 0211  HGBA1C 6.4*   Urine Drug Screen:  Recent Labs  Lab 03/27/21 1909  LABOPIA NONE DETECTED  COCAINSCRNUR NONE DETECTED  LABBENZ NONE DETECTED  AMPHETMU NONE DETECTED  THCU NONE DETECTED  LABBARB NONE DETECTED    Alcohol Level  Recent Labs  Lab 03/27/21 1857  ETH <10    IMAGING past 24 hours Structural Heart Procedure  Result Date: 03/31/2021 See surgical note for result.  HYBRID OR IMAGING ([facility] ONLY)  Result Date: 03/31/2021 There is no interpretation for this exam.  This order is for images obtained during a surgical procedure.  Please See "Surgeries" Tab for more information regarding the procedure.    PHYSICAL EXAM  Temp:  [97.7 F (36.5 C)-98.4 F (36.9 C)] 97.7 F (36.5 C) (12/19 1345) Pulse  Rate:  [55-79] 58 (12/19 1530) Resp:  [15-20] 16 (12/19 1530) BP: (92-149)/(44-64) 95/49 (12/19 1500) SpO2:  [95 %-100 %] 98 % (12/19 1530) Arterial Line BP: (92-141)/(30-41) 129/37 (12/19 1530) Weight:  [65.8 kg] 65.8 kg (12/19 0949)  General - Well nourished, well developed, in no apparent distress.TCAR surgical incision seen in neck on the left Ophthalmologic - fundi not visualized due to noncooperation. Cardiovascular - Regular rhythm and rate.  Mental Status -  Level of arousal and orientation to time, place, and person were intact. Language including expression, naming, repetition, comprehension was assessed and found intact. Attention span and concentration were normal. Recent and remote memory were intact. Fund of Knowledge was assessed and was intact.  Cranial Nerves II - XII - II - Visual field intact OU. III, IV, VI - Extraocular movements intact. V - Facial sensation intact bilaterally. VII - Facial movement intact bilaterally. VIII - Hearing & vestibular intact bilaterally. X - Palate  elevates symmetrically. XI - Chin turning & shoulder shrug intact bilaterally. XII - Tongue protrusion intact.  Motor Strength - The patients strength was normal in all extremities and pronator drift was absent.  Bulk was normal and fasciculations were absent.   Motor Tone - Muscle tone was assessed at the neck and appendages and was normal. Reflexes - The patients reflexes were symmetrical in all extremities and he had no pathological reflexes. Sensory - Light touch, temperature/pinprick were assessed and were symmetrical.   Coordination - The patient had normal movements in the hands and feet with no ataxia or dysmetria.  Tremor was absent. Gait and Station - deferred.   ASSESSMENT/PLAN [name] is a 86 y.o. male with history of ankylosis spondylitis, iron deficiency anemia presenting with difficulty with his speech. He initially went to a doctors appointment with Dr.  Lorenso Courier on 12/15 in his usual state of health. Around lunch time, he went to get lunch and noticed his speech was gibberish at checkout. He noticed some confusion but he was able to pay for his food. He then went to a hardware store afterwards where no one could understand him, he knew he needed salt for his water softener, but could not verbalize it. His wife came home around 7pm and found that his speech was still gibberish so she called EMS. His symptoms improved when EMS arrived.  CTA shows 70% stenosis of his proximal left ICA.  BVS was consulted and Dr. Shirlean Mylar will plan for a TCAR on Monday.  Stroke:  left punctate infarct posterior left frontal lobe likely secondary small vessel disease source and left ICA stenosis  Code Stroke CT head Small vessel disease. Atrophy CTA head & neck 70% stenosis of proximal left ICA MRI  acute/subacute punctate infarct in posterior left frontal lobe 2D Echo left ventricular ejection fraction of 60 to 65%.  No regional wall motion abnormalities. LDL 66 HgbA1c 6.4 VTE prophylaxis - SCDs No antithrombotic prior to admission, now on aspirin 325 mg daily and clopidogrel 75 mg daily.  Therapy recommendations:  possible outpatient PT vs none pending progress Disposition: Home Hypotension BP goal systolic 505 NS bolus [id number] Encourage fluids Consider midodrine until procedure if BP does not improve with fluids  Left ICA stenosis Vascular surgery consult Avoid hypotension- encourage fluids TCAR with Dr. Shirlean Mylar on Monday  Hyperlipidemia Home meds:  Crestor 10mg , resumed in hospital LDL 66, goal < 70 Continue statin at discharge  Other Stroke Risk Factors Advanced Age >/= 65  ETOH use, alcohol level <10, advised to drink no more than 1-2 drink(s) a day Coronary artery disease Migraines Obstructive sleep apnea, on CPAP at home Congestive heart failure  Other Active Problems Ankylosising spondolitis Iron deficiency anemia  Hospital day # 3    Continue  ongoing antiplatelet therapy .mobilize out of bed as tolerated  .  Avoid hypotension   Long discussion with patient and answer questions.  Discussed with patient and Dr. Algis Liming.greater than 50% time during this 25-minute visit was spent in counseling coordination of care about his symptomatic carotid stenosis and discussion about carotid revascularization and answering questions.  Antony Contras, MD Medical Director [place] Pager: [phone] 03/31/2021 4:03 PM   To contact Stroke Continuity provider, please refer to [email or web address] After hours, contact General Neurology

## 2021-03-31 NOTE — Progress Notes (Signed)
PT Cancellation Note  Patient Details Name: Steven [name] MRN: [id number] DOB: [birth year]   Cancelled Treatment:    Reason Eval/Treat Not Completed: Patient at procedure or test/unavailable. Pt is currently off unit. Will check back as schedule allows to continue with PT POC.    Thelma Comp 03/31/2021, 10:09 AM  Rolinda Roan, PT, DPT Acute Rehabilitation Services Pager: [phone] Office: [phone]

## 2021-03-31 NOTE — Anesthesia Procedure Notes (Signed)
Procedure Name: Intubation Date/Time: 03/31/2021 10:53 AM Performed by: Cathren Harsh, CRNA Pre-anesthesia Checklist: Patient identified, Emergency Drugs available, Suction available and Patient being monitored Patient Re-evaluated:Patient Re-evaluated prior to induction Oxygen Delivery Method: Circle System Utilized Preoxygenation: Pre-oxygenation with 100% oxygen Induction Type: IV induction Ventilation: Mask ventilation without difficulty Laryngoscope Size: Glidescope and 4 Grade View: Grade I Tube type: Oral Tube size: 7.5 mm Number of attempts: 1 Airway Equipment and Method: Stylet and Oral airway Placement Confirmation: ETT inserted through vocal cords under direct vision, positive ETCO2 and breath sounds checked- equal and bilateral Secured at: 23 cm Tube secured with: Tape Dental Injury: Teeth and Oropharynx as per pre-operative assessment  Difficulty Due To: Difficulty was anticipated and Difficult Airway- due to reduced neck mobility Future Recommendations: Recommend- induction with short-acting agent, and alternative techniques readily available

## 2021-03-31 NOTE — Anesthesia Preprocedure Evaluation (Addendum)
Anesthesia Evaluation  Patient identified by MRN, date of birth, ID band Patient awake    Reviewed: Allergy & Precautions, H&P , NPO status , Patient's Chart, lab work & pertinent test results  Airway Mallampati: II  TM Distance: >3 FB Neck ROM: Full    Dental no notable dental hx. (+) Teeth Intact, Dental Advisory Given   Pulmonary neg pulmonary ROS, former smoker,    Pulmonary exam normal breath sounds clear to auscultation       Cardiovascular negative cardio ROS   Rhythm:Regular Rate:Normal     Neuro/Psych Anxiety Depression TIACVA    GI/Hepatic negative GI ROS, Neg liver ROS,   Endo/Other  negative endocrine ROS  Renal/GU negative Renal ROS  negative genitourinary   Musculoskeletal  (+) Arthritis , Osteoarthritis,    Abdominal   Peds  Hematology  (+) Blood dyscrasia, anemia ,   Anesthesia Other Findings   Reproductive/Obstetrics negative OB ROS                            Anesthesia Physical Anesthesia Plan  ASA: 3  Anesthesia Plan: General   Post-op Pain Management: Tylenol PO (pre-op)   Induction: Intravenous  PONV Risk Score and Plan: 3 and Ondansetron and Dexamethasone  Airway Management Planned: Oral ETT  Additional Equipment: Arterial line  Intra-op Plan:   Post-operative Plan: Extubation in OR  Informed Consent: I have reviewed the patients History and Physical, chart, labs and discussed the procedure including the risks, benefits and alternatives for the proposed anesthesia with the patient or authorized representative who has indicated his/her understanding and acceptance.     Dental advisory given  Plan Discussed with: CRNA  Anesthesia Plan Comments:         Anesthesia Quick Evaluation

## 2021-03-31 NOTE — Transfer of Care (Signed)
Immediate Anesthesia Transfer of Care Note  Patient: Steven [name]  Procedure(s) Performed: TRANSCAROTID ARTERY REVASCULARIZATION (Left: Neck) ULTRASOUND GUIDANCE FOR VASCULAR ACCESS (Right: Groin)  Patient Location: PACU  Anesthesia Type:General  Level of Consciousness: awake, drowsy, patient cooperative and responds to stimulation  Airway & Oxygen Therapy: Patient Spontanous Breathing and Patient connected to face mask oxygen  Post-op Assessment: Report given to RN, Post -op Vital signs reviewed and stable and Patient moving all extremities X 4  Post vital signs: Reviewed and stable  Last Vitals:  Vitals Value Taken Time  BP 96/47 03/31/21 1342  Temp    Pulse 79 03/31/21 1344  Resp 15 03/31/21 1344  SpO2 100 % 03/31/21 1344  Vitals shown include unvalidated device data.  Last Pain:  Vitals:   03/31/21 1011  TempSrc:   PainSc: 0-No pain         Complications: No notable events documented.

## 2021-04-01 ENCOUNTER — Encounter (HOSPITAL_COMMUNITY): Payer: Self-pay | Admitting: Vascular Surgery

## 2021-04-01 DIAGNOSIS — Z8739 Personal history of other diseases of the musculoskeletal system and connective tissue: Secondary | ICD-10-CM

## 2021-04-01 DIAGNOSIS — Z95828 Presence of other vascular implants and grafts: Secondary | ICD-10-CM

## 2021-04-01 LAB — CBC
HCT: 29.9 % — ABNORMAL LOW (ref 39.0–52.0)
Hemoglobin: 9.8 g/dL — ABNORMAL LOW (ref 13.0–17.0)
MCH: 33.1 pg (ref 26.0–34.0)
MCHC: 32.8 g/dL (ref 30.0–36.0)
MCV: 101 fL — ABNORMAL HIGH (ref 80.0–100.0)
Platelets: 158 10*3/uL (ref 150–400)
RBC: 2.96 MIL/uL — ABNORMAL LOW (ref 4.22–5.81)
RDW: 13.1 % (ref 11.5–15.5)
WBC: 5.3 10*3/uL (ref 4.0–10.5)
nRBC: 0 % (ref 0.0–0.2)

## 2021-04-01 LAB — LIPID PANEL
Cholesterol: 89 mg/dL (ref 0–200)
HDL: 34 mg/dL — ABNORMAL LOW (ref >40)
LDL Cholesterol: 38 mg/dL (ref 0–99)
Total CHOL/HDL Ratio: 2.6 RATIO
Triglycerides: 87 mg/dL (ref <150)
VLDL: 17 mg/dL (ref 0–40)

## 2021-04-01 LAB — BASIC METABOLIC PANEL
Anion gap: 11 (ref 5–15)
BUN: 9 mg/dL (ref 8–23)
CO2: 21 mmol/L — ABNORMAL LOW (ref 22–32)
Calcium: 7.9 mg/dL — ABNORMAL LOW (ref 8.9–10.3)
Chloride: 102 mmol/L (ref 98–111)
Creatinine, Ser: 0.75 mg/dL (ref 0.61–1.24)
GFR, Estimated: >60 mL/min (ref >60)
Glucose, Bld: 96 mg/dL (ref 70–99)
Potassium: 3.8 mmol/L (ref 3.5–5.1)
Sodium: 134 mmol/L — ABNORMAL LOW (ref 135–145)

## 2021-04-01 MED ORDER — MIDODRINE HCL 5 MG PO TABS
10.0000 mg | ORAL_TABLET | Freq: Two times a day (BID) | ORAL | Status: DC
  Administered 2021-04-01: 09:00:00 10 mg via ORAL

## 2021-04-01 MED ORDER — ACETAMINOPHEN ER 650 MG PO TBCR: 650.0000 mg | EXTENDED_RELEASE_TABLET | Freq: Three times a day (TID) | ORAL | Status: DC | PRN

## 2021-04-01 MED ORDER — MIDODRINE HCL 10 MG PO TABS: 10.0000 mg | ORAL_TABLET | Freq: Two times a day (BID) | ORAL | 1 refills | Status: DC

## 2021-04-01 MED ORDER — ROSUVASTATIN CALCIUM 20 MG PO TABS
20.0000 mg | ORAL_TABLET | Freq: Every day | ORAL | 1 refills | Status: AC
Start: 2021-04-01 — End: ?

## 2021-04-01 MED ORDER — PANTOPRAZOLE SODIUM 40 MG PO TBEC: 40.0000 mg | DELAYED_RELEASE_TABLET | Freq: Every day | ORAL | 1 refills | Status: AC

## 2021-04-01 MED ORDER — ASPIRIN 325 MG PO TBEC: 325.0000 mg | DELAYED_RELEASE_TABLET | Freq: Every day | ORAL | 1 refills | Status: AC

## 2021-04-01 MED ORDER — CLOPIDOGREL BISULFATE 75 MG PO TABS
75.0000 mg | ORAL_TABLET | Freq: Every day | ORAL | 1 refills | Status: AC
Start: 2021-04-02 — End: ?

## 2021-04-01 MED ORDER — ROSUVASTATIN CALCIUM 20 MG PO TABS: 20.0000 mg | ORAL_TABLET | Freq: Every day | ORAL | Status: DC

## 2021-04-01 NOTE — Progress Notes (Signed)
Physical Therapy Treatment Patient Details Name: Steven [name] MRN: [id number] DOB: [birth year] Today's Date: 04/01/2021   History of Present Illness Steven [name] is a 86 y.o. male with medical history significant for HLD, ankylosing spodilitis, hx of SBO due to adhesions with colostomy then reversal, R THA x2, L shoulder DCR, RCR admitted due to transiet aphasia. Undergoing work up for possible stroke and for possible carotid endarterectomy. MRI of brain showed:punctate focus of restricted diffusion in the  posterior left frontal lobe, which may represent acute or subacute  infarct. Pt underwent TCAR on 12/19.    PT Comments    Pt doing well with mobility. Ready for dc home with wife from PT standpoint.    Recommendations for follow up therapy are one component of a multi-disciplinary discharge planning process, led by the attending physician.  Recommendations may be updated based on patient status, additional functional criteria and insurance authorization.  Follow Up Recommendations  No PT follow up     Assistance Recommended at Discharge PRN  Equipment Recommendations  None recommended by PT    Recommendations for Other Services       Precautions / Restrictions Precautions Precautions: None     Mobility  Bed Mobility               General bed mobility comments: Pt up in chair    Transfers Overall transfer level: Modified independent Equipment used: None   Sit to Stand: Modified independent (Device/Increase time)                Ambulation/Gait Ambulation/Gait assistance: Supervision Gait Distance (Feet): 225 Feet Assistive device: None Gait Pattern/deviations: Step-through pattern;Decreased stride length Gait velocity: decr Gait velocity interpretation: 1.31 - 2.62 ft/sec, indicative of limited community ambulator   General Gait Details: Assist for safety. Occasional use of rail for reassurance and confidence.   Stairs              Wheelchair Mobility    Modified Rankin (Stroke Patients Only) Modified Rankin (Stroke Patients Only) Pre-Morbid Rankin Score: No significant disability Modified Rankin: Moderate disability     Balance Overall balance assessment: Needs assistance Sitting-balance support: No upper extremity supported Sitting balance-[name]: Normal     Standing balance support: No upper extremity supported;During functional activity Standing balance-[name]: Good                              Cognition Arousal/Alertness: Awake/alert Behavior During Therapy: WFL for tasks assessed/performed Overall Cognitive Status: Within Functional Limits for tasks assessed                                          Exercises      General Comments General comments (skin integrity, edema, etc.): BP 100's/40's prior to and after amb      Pertinent Vitals/Pain Pain Assessment: No/denies pain    Home Living                          Prior Function            PT Goals (current goals can now be found in the care plan section) Acute Rehab PT Goals Patient Stated Goal: to return to independent Progress towards PT goals: Progressing toward goals    Frequency    Min 4X/week  PT Plan Discharge plan needs to be updated    Co-evaluation              AM-PAC PT "6 Clicks" Mobility   Outcome Measure  Help needed turning from your back to your side while in a flat bed without using bedrails?: None Help needed moving from lying on your back to sitting on the side of a flat bed without using bedrails?: None Help needed moving to and from a bed to a chair (including a wheelchair)?: None Help needed standing up from a chair using your arms (e.g., wheelchair or bedside chair)?: None Help needed to walk in hospital room?: A Little Help needed climbing 3-5 steps with a railing? : A Little 6 Click Score: 22    End of Session   Activity  Tolerance: Patient tolerated treatment well Patient left: in chair;with call bell/phone within reach Nurse Communication: Mobility status PT Visit Diagnosis: Other abnormalities of gait and mobility (R26.89);Muscle weakness (generalized) (M62.81)     Time: 4037-5436 PT Time Calculation (min) (ACUTE ONLY): 14 min  Charges:  $Gait Training: 8-22 mins                     [place] Pager [phone] Office [place] 04/01/2021, 12:44 PM

## 2021-04-01 NOTE — Progress Notes (Signed)
STROKE TEAM PROGRESS NOTE   INTERVAL HISTORY Patient is sitting in the toilet.  He is doing well.  TCAR procedure went well. Patient has no new complaints today.  .  Vital signs are stable.  Neurological exam is unchanged.    Vitals:   04/01/21 0500 04/01/21 0600 04/01/21 0723 04/01/21 1057  BP:   123/60 (!) 102/41  Pulse:   72 69  Resp: 20 18 20 19   Temp:   99.1 F (37.3 C) 97.8 F (36.6 C)  TempSrc:   Oral Oral  SpO2:   98% 99%  Weight:      Height:       CBC:  Recent Labs  Lab 03/27/21 1248 03/27/21 1909 03/31/21 0418 03/31/21 1430 04/01/21 0254  WBC 6.5 6.4   < > 4.6 5.3  NEUTROABS 5.1 5.0  --   --   --   HGB 11.3* 11.8*   < > 9.3* 9.8*  HCT 33.8* 35.3*   < > 28.7* 29.9*  MCV 98.5 101.1*   < > 100.7* 101.0*  PLT 234 228   < > 157 158   < > = values in this interval not displayed.   Basic Metabolic Panel:  Recent Labs  Lab 03/31/21 0418 03/31/21 1430 04/01/21 0254  NA 134*  --  134*  K 3.8  --  3.8  CL 101  --  102  CO2 27  --  21*  GLUCOSE 107*  --  96  BUN 9  --  9  CREATININE 0.76 0.61 0.75  CALCIUM 8.4*  --  7.9*   Lipid Panel:  Recent Labs  Lab 04/01/21 0254  CHOL 89  TRIG 87  HDL 34*  CHOLHDL 2.6  VLDL 17  LDLCALC 38   HgbA1c:  Recent Labs  Lab 03/28/21 0211  HGBA1C 6.4*   Urine Drug Screen:  Recent Labs  Lab 03/27/21 1909  LABOPIA NONE DETECTED  COCAINSCRNUR NONE DETECTED  LABBENZ NONE DETECTED  AMPHETMU NONE DETECTED  THCU NONE DETECTED  LABBARB NONE DETECTED    Alcohol Level  Recent Labs  Lab 03/27/21 1857  ETH <10    IMAGING past 24 hours No results found.  PHYSICAL EXAM  Temp:  [97.1 F (36.2 C)-99.1 F (37.3 C)] 97.8 F (36.6 C) (12/20 1057) Pulse Rate:  [55-75] 69 (12/20 1057) Resp:  [16-20] 19 (12/20 1057) BP: (93-127)/(41-72) 102/41 (12/20 1057) SpO2:  [97 %-100 %] 99 % (12/20 1057) Arterial Line BP: (124-147)/(37-51) 147/51 (12/20 0400)  General - Well nourished, well developed, in no apparent  distress.TCAR surgical incision seen in neck on the left Ophthalmologic - fundi not visualized due to noncooperation. Cardiovascular - Regular rhythm and rate.  Mental Status -  Level of arousal and orientation to time, place, and person were intact. Language including expression, naming, repetition, comprehension was assessed and found intact. Attention span and concentration were normal. Recent and remote memory were intact. Fund of Knowledge was assessed and was intact.  Cranial Nerves II - XII - II - Visual field intact OU. III, IV, VI - Extraocular movements intact. V - Facial sensation intact bilaterally. VII - Facial movement intact bilaterally. VIII - Hearing & vestibular intact bilaterally. X - Palate elevates symmetrically. XI - Chin turning & shoulder shrug intact bilaterally. XII - Tongue protrusion intact.  Motor Strength - The patients strength was normal in all extremities and pronator drift was absent.  Bulk was normal and fasciculations were absent.   Motor Tone - Muscle  tone was assessed at the neck and appendages and was normal. Reflexes - The patients reflexes were symmetrical in all extremities and he had no pathological reflexes. Sensory - Light touch, temperature/pinprick were assessed and were symmetrical.   Coordination - The patient had normal movements in the hands and feet with no ataxia or dysmetria.  Tremor was absent. Gait and Station - deferred.   ASSESSMENT/PLAN [name] is a 86 y.o. male with history of ankylosis spondylitis, iron deficiency anemia presenting with difficulty with his speech. He initially went to a doctors appointment with Dr. Lorenso Courier on 12/15 in his usual state of health. Around lunch time, he went to get lunch and noticed his speech was gibberish at checkout. He noticed some confusion but he was able to pay for his food. He then went to a hardware store afterwards where no one could understand him, he knew he needed salt for  his water softener, but could not verbalize it. His wife came home around 7pm and found that his speech was still gibberish so she called EMS. His symptoms improved when EMS arrived.  CTA shows 70% stenosis of his proximal left ICA.  BVS was consulted and Dr. Shirlean Mylar will plan for a TCAR on Monday.  Stroke:  left punctate infarct posterior left frontal lobe likely secondary small vessel disease source and left ICA stenosis  Code Stroke CT head Small vessel disease. Atrophy CTA head & neck 70% stenosis of proximal left ICA MRI  acute/subacute punctate infarct in posterior left frontal lobe 2D Echo left ventricular ejection fraction of 60 to 65%.  No regional wall motion abnormalities. LDL 66 HgbA1c 6.4 VTE prophylaxis - SCDs No antithrombotic prior to admission, now on aspirin 325 mg daily and clopidogrel 75 mg daily.  Therapy recommendations:  possible outpatient PT vs none pending progress Disposition: Home Hypotension BP goal systolic 161 NS bolus 096EA Encourage fluids Consider midodrine until procedure if BP does not improve with fluids  Left ICA stenosis Vascular surgery consult Avoid hypotension- encourage fluids TCAR with Dr. Shirlean Mylar on Monday  Hyperlipidemia Home meds:  Crestor 10mg , resumed in hospital LDL 66, goal < 70 Continue statin at discharge  Other Stroke Risk Factors Advanced Age >/= 65  ETOH use, alcohol level <10, advised to drink no more than 1-2 drink(s) a day Coronary artery disease Migraines Obstructive sleep apnea, on CPAP at home Congestive heart failure  Other Active Problems Ankylosising spondolitis Iron deficiency anemia  Hospital day # 4    Continue aspirin alone now and stop Plavix.  Follow-up as an outpatient stroke clinic in 2 months    Long discussion with patient and answer questions.  Discussed with patient and Dr. Algis Liming.greater than 50% time during this 25-minute visit was spent in counseling coordination of care about his symptomatic  carotid stenosis and discussion about carotid revascularization and answering questions.  Stroke team will sign off.  Kindly call for questions.  Antony Contras, MD Medical Director [place] Pager: [phone] 04/01/2021 3:18 PM   To contact Stroke Continuity provider, please refer to [email or web address] After hours, contact General Neurology

## 2021-04-01 NOTE — Anesthesia Postprocedure Evaluation (Signed)
Anesthesia Post Note  Patient: Steven [name]  Procedure(s) Performed: TRANSCAROTID ARTERY REVASCULARIZATION (Left: Neck) ULTRASOUND GUIDANCE FOR VASCULAR ACCESS (Right: Groin)     Patient location during evaluation: Other Anesthesia Type: General Level of consciousness: awake and alert Pain management: pain level controlled Vital Signs Assessment: post-procedure vital signs reviewed and stable Respiratory status: spontaneous breathing, nonlabored ventilation and respiratory function stable Cardiovascular status: blood pressure returned to baseline and stable Postop Assessment: no apparent nausea or vomiting Anesthetic complications: no   No notable events documented.  Last Vitals:  Vitals:   04/01/21 0500 04/01/21 0600  BP:    Pulse:    Resp: 20 18  Temp:    SpO2:      Last Pain:  Vitals:   04/01/21 0400  TempSrc: Oral  PainSc:                  Soren Lazarz,W. EDMOND

## 2021-04-01 NOTE — Discharge Summary (Signed)
Physician Discharge Steven [name]:[id number] DOB: Apr 14, 1934  PCP: [name], MD  Admitted from: Home Discharged to: Home  Admit date: 03/27/2021 Discharge date: 04/01/2021  Recommendations for Outpatient Follow-up:    [facility]--Outpatient Follow up.   Why: The outpatient therapy will contact you for the first appointment Contact information: [address] [place], [place] [address]  [phone]        [facility] Follow up in 4 week(s).   Specialty: Vascular Surgery Contact information: [address] [place] [phone]        [name], MD. Schedule an appointment as soon as possible for a visit in 1 week(s).   Specialty: Internal Medicine Why: To be seen with repeat labs (CBC & BMP). Contact information: [place] [address] [phone]                  Home Health: Outpatient PT    Equipment/Devices: None    Discharge Condition: Improved and stable.   Code Status: Full Code Diet recommendation:  Discharge Diet Orders (From admission, onward)     Start     Ordered   04/01/21 0000  Diet - low sodium heart healthy        04/01/21 1226             Discharge Diagnoses:  Principal Problem:   TIA (transient ischemic attack) Active Problems:   Hyperlipidemia   History of ankylosing spondylitis   CVA (cerebral vascular accident) ([place])   Stenosis of left carotid artery   Brief Summary: 86 year old male with medical history significant for hyperlipidemia, ankylosing spondylitis, anxiety and depression, presented to ED on 03/27/2021 with speech difficulty which lasted for approximately 6 hours on day of admission.  Admitted for acute ischemic stroke suspected due to symptomatic left ICA stenosis.  Neurology/stroke MD and vascular surgery were consulted.  Stroke work-up was completed.   Vascular surgery performed TCAR (plans carotid artery revascularization) on 03/31/2021.  Issues with hypotension/orthostatic hypotension, treated with IV fluids and midodrine.      Assessment & Plan:   Acute ischemic stroke (posterior left frontal lobe) secondary to symptomatic left ICA stenosis: -CT head 12/15: No acute intracranial abnormality. -MRI brain: Punctate focus of restricted diffusion in the posterior left frontal lobe, which may represent acute or subacute infarct. -CTA head and neck: Approximately 70% stenosis of the proximal left ICA.  No other hemodynamically significant stenosis in the neck.  No intracranial large vessel occlusion or significant stenosis. -2D echo: LVEF 60 to 65%.  No aortic stenosis is present. -A1c 6.4.  LDL 66. -PT recommends outpatient PT versus no follow-up.  OT and SLP have no follow-up needs.  Outpatient PT arranged. -Not on antiplatelets PTA.  Now on DAPT with aspirin 325 Mg daily + Plavix 75 Mg daily.  Continue statins/dose increased. -Neurology consultation appreciated.  Outpatient follow-up with neurology. - Vascular surgery consulted for management of left ICA stenosis.   Symptomatic left ICA stenosis, s/p TCAR 12/19 -Vascular surgery consultation appreciated and s/p TCAR on 12/19  -Vascular surgery follow-up appreciated.  Cleared for discharge from their standpoint on DAPT and high intensity statin.  Their offices will set up outpatient follow-up   Hypotension/orthostatic hypotension -Had ongoing issues with asymptomatic hypotension/orthostatic hypotension since admission despite IV fluids.  Initiated midodrine 10 Mg 3 times daily.  Blood pressures have improved but still fluctuating at times to  low.   -Added bilateral knee-high compression stockings. -Attempted to avoid hypotension prior to TCAR given left ICA stenosis and risk of worsening stroke. -Ongoing soft blood pressures, 102/41 and 106/46 earlier today.  Will reduce midodrine to 10 Mg  twice daily at time of discharge.  Close outpatient follow-up with with PCP in a week and can consider weaning off as tolerated.   Hyperlipidemia - Continue Crestor, dose increased..   Ankylosing spondylitis -On weekly methotrexate and daily folic acid supplements for same.   Anxiety and depression -Does not appear to be on any medications for this.   Macrocytic anemia -May be related to anemia of chronic disease versus other etiology.  Hemoglobin dropped from 11.7 to the 9 g range postprocedure, likely related to some acute blood loss anemia from procedure.  Stable.  Outpatient follow-up   Body mass index is 22.05 kg/m.   Consultants:   Neurology Vascular surgery   Procedures:   Left TCAR 12/19 by Dr. Unk Lightning   Discharge Instructions  Discharge Instructions     Ambulatory referral to Neurology   Complete by: As directed    An appointment is requested in approximately: 2 weeks   Ambulatory referral to Physical Therapy   Complete by: As directed    Call MD for:   Complete by: As directed    Recurrent speech difficulties or strokelike symptoms.   Call MD for:  difficulty breathing, headache or visual disturbances   Complete by: As directed    Call MD for:  extreme fatigue   Complete by: As directed    Call MD for:  persistant dizziness or light-headedness   Complete by: As directed    Call MD for:  persistant nausea and vomiting   Complete by: As directed    Call MD for:  redness, tenderness, or signs of infection (pain, swelling, redness, odor or green/yellow discharge around incision site)   Complete by: As directed    Call MD for:  severe uncontrolled pain   Complete by: As directed    Call MD for:  temperature >100.4   Complete by: As directed    Diet - low sodium heart healthy   Complete by: As directed    Increase activity slowly   Complete by: As directed    No wound care   Complete by: As directed         Medication List     TAKE these medications     acetaminophen 650 MG CR tablet Commonly known as: TYLENOL Take 1 tablet (650 mg total) by mouth every 8 (eight) hours as needed for pain or fever. What changed:  when to take this reasons to take this   aspirin 325 MG EC tablet Take 1 tablet (325 mg total) by mouth daily. Start taking on: April 02, 2021   B-complex with vitamin C tablet Take 1 tablet by mouth daily.   Biotin 1 MG Caps Take 1 capsule by mouth daily.   Calcium Carbonate-Vitamin D 600-400 MG-UNIT tablet Take 1 tablet by mouth 2 (two) times daily.   cholecalciferol 25 MCG (1000 UNIT) tablet Commonly known as: VITAMIN D Take 1,000 Units by mouth daily.   citalopram 20 MG tablet Commonly known as: CELEXA Take 20 mg by mouth daily.   clopidogrel 75 MG tablet Commonly known as: PLAVIX Take 1 tablet (75 mg total) by mouth daily. Start taking on: April 02, 9508   folic acid 1 MG tablet Commonly known as: FOLVITE Take 1 mg by mouth  in the morning and at bedtime.   ipratropium 0.06 % nasal spray Commonly known as: ATROVENT Place 2 sprays into both nostrils 2 (two) times daily as needed (allergies).   methotrexate 2.5 MG tablet Commonly known as: RHEUMATREX Take 20 mg by mouth once a week. TAKE 8 TABLETS (20 MG) BY MOUTH ONCE A WEEK.   midodrine 10 MG tablet Commonly known as: PROAMATINE Take 1 tablet (10 mg total) by mouth 2 (two) times daily with a meal.   pantoprazole 40 MG tablet Commonly known as: PROTONIX Take 1 tablet (40 mg total) by mouth daily. Start taking on: April 02, 2021   polyethylene glycol 17 g packet Commonly known as: MIRALAX / GLYCOLAX Take 17 g by mouth daily as needed for mild constipation or moderate constipation (alaso available OTC).   PRESERVISION AREDS 2 PO Take 1 tablet by mouth in the morning and at bedtime.   rosuvastatin 20 MG tablet Commonly known as: CRESTOR Take 1 tablet (20 mg total) by mouth at bedtime. What changed:  medication strength how much  to take   Soothe XP Soln Place 1 drop into both eyes 2 (two) times daily.       Allergies  Allergen Reactions   Other Hives   Indomethacin Hives and Other (See Comments)   Lactose Intolerance (Gi)     GI UPSET      Procedures/Studies: CT ANGIO HEAD NECK W WO CM  Result Date: 03/27/2021 CLINICAL DATA:  Stroke suspected, aphasia, slurred speech EXAM: CT ANGIOGRAPHY HEAD AND NECK TECHNIQUE: Multidetector CT imaging of the head and neck was performed using the standard protocol during bolus administration of intravenous contrast. Multiplanar CT image reconstructions and MIPs were obtained to evaluate the vascular anatomy. Carotid stenosis measurements (when applicable) are obtained utilizing NASCET criteria, using the distal internal carotid diameter as the denominator. CONTRAST:  13mL OMNIPAQUE IOHEXOL 350 MG/ML SOLN COMPARISON:  CT head 03/27/2021.  No prior CTA. FINDINGS: CT HEAD FINDINGS For noncontrast findings, please see same day CT head. CTA NECK FINDINGS Aortic arch: Standard branching. Imaged portion shows no evidence of aneurysm or dissection. No significant stenosis of the major arch vessel origins. Aortic atherosclerosis. Right carotid system: No evidence of dissection, stenosis (50% or greater) or occlusion. Calcifications in the bifurcation and in the proximal right ICA, which are not hemodynamically significant. Left carotid system: Approximately 70% stenosis of the proximal left ICA, secondary to calcified and noncalcified plaque (series 6, image 174). No evidence of dissection. Vertebral arteries: Mild narrowing of the origin of the right vertebral artery, secondary to calcified and noncalcified plaque. The right vertebral artery is otherwise patent and dominant. The left vertebral artery is patent from its origin to the vertebrobasilar junction. No evidence of dissection. Skeleton: Complete osseous fusion of the cervical spine, consistent with history of ankylosing spondylitis.  No acute osseous abnormality. Other neck: Negative. Upper chest: Negative. Review of the MIP images confirms the above findings CTA HEAD FINDINGS Anterior circulation: Both internal carotid arteries are patent to the termini, without stenosis or other abnormality. Normal right A1. Hypoplastic or stenotic left A1. Normal anterior communicating artery. Anterior cerebral arteries are patent to their distal aspects. No M1 stenosis or occlusion. Normal MCA bifurcations. Distal MCA branches perfused and symmetric. Posterior circulation: Vertebral arteries patent to the vertebrobasilar junction without stenosis. Posterior inferior cerebral arteries patent bilaterally. Basilar patent to its distal aspect but mildly irregular. Superior cerebellar arteries patent bilaterally. PCAs perfused to their distal aspects without stenosis. Fetal origin  of the right PCA, with a patent right posterior communicating artery. The left posterior communicating artery is not visualized. Venous sinuses: As permitted by contrast timing, patent. Anatomic variants: None significant Review of the MIP images confirms the above findings IMPRESSION: 1. Approximately 70% stenosis of the proximal left ICA. No other hemodynamically significant stenosis in the neck. 2. No intracranial large vessel occlusion or significant stenosis. Electronically Signed   By: Merilyn Baba M.D.   On: 03/27/2021 23:54   CT HEAD WO CONTRAST (5MM)  Result Date: 03/27/2021 CLINICAL DATA:  Slurred speech, generalized weakness EXAM: CT HEAD WITHOUT CONTRAST TECHNIQUE: Contiguous axial images were obtained from the base of the skull through the vertex without intravenous contrast. COMPARISON:  None. FINDINGS: Brain: No evidence of acute infarction, hemorrhage, hydrocephalus, extra-axial collection or mass lesion/mass effect. Global cortical atrophy. Subcortical white matter and periventricular small vessel ischemic changes. Vascular: No hyperdense vessel or unexpected  calcification. Skull: Normal. Negative for fracture or focal lesion. Sinuses/Orbits: The visualized paranasal sinuses are essentially clear. The mastoid air cells are unopacified. Other: None. IMPRESSION: No evidence of acute intracranial abnormality. Atrophy with small vessel ischemic changes. Electronically Signed   By: Julian Hy M.D.   On: 03/27/2021 19:48   MR BRAIN WO CONTRAST  Result Date: 03/28/2021 CLINICAL DATA:  Transient ischemic attack, difficulty speaking EXAM: MRI HEAD WITHOUT CONTRAST TECHNIQUE: Multiplanar, multiecho pulse sequences of the brain and surrounding structures were obtained without intravenous contrast. COMPARISON:  No prior MRI, correlation is made with CT head and CTA head and neck FINDINGS: Evaluation is somewhat limited by motion artifact. Brain: Punctate focus of mild restricted diffusion in the posterior left frontal lobe (series 13, image 94), which may represent an acute or subacute infarct. No acute hemorrhage, mass, mass effect, or midline shift. T2 hyperintense signal in the periventricular white matter, likely the sequela of chronic small vessel ischemic disease. Global cerebral atrophy, not unexpected for age, which is slightly more pronounced in the parietal lobes bilaterally. No foci of hemosiderin deposition to suggest remote hemorrhage. Vascular: Normal flow voids. Skull and upper cervical spine: Osseous fusion of C1, C2, and C3, consistent with the patient's history of ankylosing spondylitis. Otherwise normal marrow signal. Sinuses/Orbits: Negative.  Status post bilateral lens replacements. Other: None. IMPRESSION: Evaluation is somewhat limited by motion artifact. Within this limitation, there is a punctate focus of restricted diffusion in the posterior left frontal lobe, which may represent acute or subacute infarct. Electronically Signed   By: Merilyn Baba M.D.   On: 03/28/2021 01:08   ECHOCARDIOGRAM COMPLETE  Result Date: 03/28/2021     ECHOCARDIOGRAM REPORT   Patient Name:   Steven [name] Date of Exam: 03/28/2021 Medical Rec #:  [id number]     Height:       68.0 in Accession #:    [id number]    Weight:       145.0 lb Date of Birth:  1935-01-19     BSA:          1.783 m Patient Age:    86 years      BP:           109/53 mmHg Patient Gender: M             HR:           65 bpm. Exam Location:  Inpatient Procedure: 2D Echo, Color Doppler and Cardiac Doppler Indications:    TIA  History:        Patient has  prior history of Echocardiogram examinations, most                 recent 09/29/2016. Signs/Symptoms:Dyspnea; Risk                 Factors:Dyslipidemia.  Sonographer:    Bernadene Person RDCS Referring Phys: [id number] [place]  1. Left ventricular ejection fraction, by estimation, is 60 to 65%. The left ventricle has normal function. The left ventricle has no regional wall motion abnormalities. Left ventricular diastolic parameters are indeterminate.  2. Right ventricular systolic function is normal. The right ventricular size is normal. There is normal pulmonary artery systolic pressure.  3. Left atrial size was mildly dilated.  4. Thickening of the mitral vavle c/f degenerative dx. No evidence of mitral valve regurgitation.  5. Focal calcification. Aortic valve regurgitation is trivial. No aortic stenosis is present.  6. The inferior vena cava is normal in size with greater than 50% respiratory variability, suggesting right atrial pressure of 3 mmHg. Conclusion(s)/Recommendation(s): Compared to prior echo 09/29/2016 no significant changes. FINDINGS  Left Ventricle: Left ventricular ejection fraction, by estimation, is 60 to 65%. The left ventricle has normal function. The left ventricle has no regional wall motion abnormalities. The left ventricular internal cavity size was normal in size. There is  no left ventricular hypertrophy. Left ventricular diastolic parameters are indeterminate. Right Ventricle: The right ventricular size is  normal. No increase in right ventricular wall thickness. Right ventricular systolic function is normal. There is normal pulmonary artery systolic pressure. The tricuspid regurgitant velocity is 2.16 m/s, and  with an assumed right atrial pressure of 3 mmHg, the estimated right ventricular systolic pressure is 16.3 mmHg. Left Atrium: Left atrial size was mildly dilated. Right Atrium: Right atrial size was normal in size. Prominent Eustachian valve. Pericardium: There is no evidence of pericardial effusion. Mitral Valve: Thickening of the mitral vavle c/f degenerative dx. There is mild calcification of the mitral valve leaflet(s). No evidence of mitral valve regurgitation. Tricuspid Valve: The tricuspid valve is grossly normal. Tricuspid valve regurgitation is mild. Aortic Valve: Focal calcification. Aortic valve regurgitation is trivial. No aortic stenosis is present. Pulmonic Valve: The pulmonic valve was not well visualized. Pulmonic valve regurgitation is not visualized. Aorta: The aortic root and ascending aorta are structurally normal, with no evidence of dilitation. Venous: The inferior vena cava is normal in size with greater than 50% respiratory variability, suggesting right atrial pressure of 3 mmHg. IAS/Shunts: The atrial septum is grossly normal.  LEFT VENTRICLE PLAX 2D LVIDd:         4.70 cm      Diastology LVIDs:         2.40 cm      LV e' medial:    5.51 cm/s LV PW:         0.80 cm      LV E/e' medial:  16.6 LV IVS:        0.90 cm      LV e' lateral:   7.38 cm/s LVOT diam:     2.10 cm      LV E/e' lateral: 12.4 LV SV:         71 LV SV Index:   40 LVOT Area:     3.46 cm  LV Volumes (MOD) LV vol d, MOD A2C: 93.0 ml LV vol d, MOD A4C: 114.0 ml LV vol s, MOD A2C: 37.5 ml LV vol s, MOD A4C: 53.7 ml LV SV MOD A2C:  55.5 ml LV SV MOD A4C:     114.0 ml LV SV MOD BP:      57.8 ml RIGHT VENTRICLE RV S prime:     13.10 cm/s TAPSE (M-mode): 2.1 cm LEFT ATRIUM             Index        RIGHT ATRIUM            Index LA diam:        3.10 cm 1.74 cm/m   RA Area:     13.40 cm LA Vol (A2C):   50.3 ml 28.22 ml/m  RA Volume:   33.20 ml  18.62 ml/m LA Vol (A4C):   42.4 ml 23.78 ml/m LA Biplane Vol: 48.4 ml 27.15 ml/m  AORTIC VALVE LVOT Vmax:   106.00 cm/s LVOT Vmean:  65.100 cm/s LVOT VTI:    0.205 m  AORTA Ao Root diam: 3.60 cm Ao Asc diam:  3.80 cm MITRAL VALVE               TRICUSPID VALVE MV Area (PHT): 3.17 cm    TR Peak grad:   18.7 mmHg MV Decel Time: 239 msec    TR Vmax:        216.00 cm/s MV E velocity: 91.60 cm/s MV A velocity: 81.80 cm/s  SHUNTS MV E/A ratio:  1.12        Systemic VTI:  0.20 m                            Systemic Diam: 2.10 cm Phineas Inches Electronically signed by Phineas Inches Signature Date/Time: 03/28/2021/5:49:12 PM    Final    Structural Heart Procedure  Result Date: 03/31/2021 See surgical note for result.  HYBRID OR IMAGING ([facility] ONLY)  Result Date: 03/31/2021 There is no interpretation for this exam.  This order is for images obtained during a surgical procedure.  Please See "Surgeries" Tab for more information regarding the procedure.      Subjective: Patient seen this morning.  Reported feeling tired.  Not much pain.  Mild discomfort at left neck procedure site.  Anxious to discharge home.  Discharge Exam:  Vitals:   04/01/21 0500 04/01/21 0600 04/01/21 0723 04/01/21 1057  BP:   123/60 (!) 102/41  Pulse:   72 69  Resp: 20 18 20 19   Temp:   99.1 F (37.3 C) 97.8 F (36.6 C)  TempSrc:   Oral Oral  SpO2:   98% 99%  Weight:      Height:        General exam: Elderly male, moderately built and thinly nourished sitting up comfortably in reclining chair without distress. Neck: Left base of neck incision sites without acute findings. Respiratory system: Clear to auscultation.  No increased work of breathing Cardiovascular system: S1 and S2 heard, RRR.  No JVD, murmurs or pedal edema.  Telemetry personally reviewed: Sinus rhythm. Gastrointestinal system: Abdomen  is nondistended, soft and nontender. No organomegaly or masses felt. Normal bowel sounds heard. Central nervous system: Alert and oriented. No focal neurological deficits.  No dysarthria or aphasia appreciated.  Has not had any recurrence of speech impediment since hospitalization. Extremities: Symmetric 5 x 5 power. Skin: No rashes, lesions or ulcers Psychiatry: Judgement and insight appear normal. Mood & affect appropriate.     The results of significant diagnostics from this hospitalization (including imaging, microbiology, ancillary and laboratory) are listed below for reference.  Microbiology: Recent Results (from the past 240 hour(s))  Resp Panel by RT-PCR (Flu A&B, Covid) Nasopharyngeal Swab     Status: None   Collection Time: 03/27/21  7:09 PM   Specimen: Nasopharyngeal Swab; Nasopharyngeal(NP) swabs in vial transport medium  Result Value Ref Range Status   SARS Coronavirus 2 by RT PCR NEGATIVE NEGATIVE Final    Comment: (NOTE) SARS-CoV-2 target nucleic acids are NOT DETECTED.  The SARS-CoV-2 RNA is generally detectable in upper respiratory specimens during the acute phase of infection. The lowest concentration of SARS-CoV-2 viral copies this assay can detect is 138 copies/mL. A negative result does not preclude SARS-Cov-2 infection and should not be used as the sole basis for treatment or other patient management decisions. A negative result may occur with  improper specimen collection/handling, submission of specimen other than nasopharyngeal swab, presence of viral mutation(s) within the areas targeted by this assay, and inadequate number of viral copies(<138 copies/mL). A negative result must be combined with clinical observations, patient history, and epidemiological information. The expected result is Negative.  Fact Sheet for Patients:  EntrepreneurPulse.com.au  Fact Sheet for Healthcare Providers:   IncredibleEmployment.be  This test is no t yet approved or cleared by the [place] FDA and  has been authorized for detection and/or diagnosis of SARS-CoV-2 by FDA under an Emergency Use Authorization (EUA). This EUA will remain  in effect (meaning this test can be used) for the duration of the COVID-19 declaration under Section 564(b)(1) of the Act, 21 U.S.C.section 360bbb-3(b)(1), unless the authorization is terminated  or revoked sooner.       Influenza A by PCR NEGATIVE NEGATIVE Final   Influenza B by PCR NEGATIVE NEGATIVE Final    Comment: (NOTE) The Xpert Xpress SARS-CoV-2/FLU/RSV plus assay is intended as an aid in the diagnosis of influenza from Nasopharyngeal swab specimens and should not be used as a sole basis for treatment. Nasal washings and aspirates are unacceptable for Xpert Xpress SARS-CoV-2/FLU/RSV testing.  Fact Sheet for Patients: EntrepreneurPulse.com.au  Fact Sheet for Healthcare Providers: IncredibleEmployment.be  This test is not yet approved or cleared by the [place] FDA and has been authorized for detection and/or diagnosis of SARS-CoV-2 by FDA under an Emergency Use Authorization (EUA). This EUA will remain in effect (meaning this test can be used) for the duration of the COVID-19 declaration under Section 564(b)(1) of the Act, 21 U.S.C. section 360bbb-3(b)(1), unless the authorization is terminated or revoked.  Performed at [facility], [place] [address], [place], [place] [address]   Urine Culture     Status: Abnormal   Collection Time: 03/30/21  8:00 PM   Specimen: Urine, Clean Catch  Result Value Ref Range Status   Specimen Description URINE, CLEAN CATCH  Final   Special Requests NONE  Final   Culture (A)  Final    <10,000 COLONIES/mL INSIGNIFICANT GROWTH Performed at [facility], [place] [address] [place], [place] [address]    Report Status 03/31/2021 FINAL  Final   Surgical pcr screen     Status: None   Collection Time: 03/31/21  4:15 AM   Specimen: Nasal Mucosa; Nasal Swab  Result Value Ref Range Status   MRSA, PCR NEGATIVE NEGATIVE Final   Staphylococcus aureus NEGATIVE NEGATIVE Final    Comment: (NOTE) The Xpert SA Assay (FDA approved for NASAL specimens in patients 5 years of age and older), is one component of a comprehensive surveillance program. It is not intended to diagnose infection nor to guide  or monitor treatment. Performed at [facility], [place] [address] [place], [place] [address]      Labs: CBC: Recent Labs  Lab 03/27/21 1248 03/27/21 1909 03/31/21 0418 03/31/21 1430 04/01/21 0254  WBC 6.5 6.4 5.7 4.6 5.3  NEUTROABS 5.1 5.0  --   --   --   HGB 11.3* 11.8* 11.7* 9.3* 9.8*  HCT 33.8* 35.3* 35.1* 28.7* 29.9*  MCV 98.5 101.1* 98.0 100.7* 101.0*  PLT 234 228 185 157 604    Basic Metabolic Panel: Recent Labs  Lab 03/27/21 1248 03/27/21 1909 03/31/21 0418 03/31/21 1430 04/01/21 0254  NA 138 136 134*  --  134*  K 4.0 3.9 3.8  --  3.8  CL 103 101 101  --  102  CO2 27 27 27   --  21*  GLUCOSE 106* 126* 107*  --  96  BUN 13 11 9   --  9  CREATININE 0.77 0.75 0.76 0.61 0.75  CALCIUM 9.0 9.0 8.4*  --  7.9*    Liver Function Tests: Recent Labs  Lab 03/27/21 1248 03/27/21 1909  AST 18 20  ALT 18 18  ALKPHOS 56 48  BILITOT 0.9 0.6  PROT 7.4 7.1  ALBUMIN 3.8 3.8    CBG: Recent Labs  Lab 03/27/21 1903  GLUCAP 130*    Lipid Profile Recent Labs    04/01/21 0254  CHOL 89  HDL 34*  LDLCALC 38  TRIG 87  CHOLHDL 2.6    Urinalysis    Component Value Date/Time   COLORURINE YELLOW 03/30/2021 [place] 03/30/2021 1844   APPEARANCEUR Hazy 04/18/2012 2324   LABSPEC 1.020 03/30/2021 1844   LABSPEC 1.018 04/18/2012 2324   PHURINE 6.0 03/30/2021 1844   GLUCOSEU NEGATIVE 03/30/2021 1844   GLUCOSEU Negative 04/18/2012 2324   HGBUR TRACE (A) 03/30/2021 1844   BILIRUBINUR NEGATIVE  03/30/2021 1844   BILIRUBINUR Negative 04/18/2012 2324   KETONESUR NEGATIVE 03/30/2021 1844   PROTEINUR NEGATIVE 03/30/2021 1844   UROBILINOGEN 0.2 04/09/2014 2138   NITRITE NEGATIVE 03/30/2021 1844   LEUKOCYTESUR TRACE (A) 03/30/2021 1844   LEUKOCYTESUR Negative 04/18/2012 2324      Time coordinating discharge: 35 minutes  SIGNED:  Vernell Leep, MD,  FACP, [facility], [facility], [facility], [place] (Care Management Physician Certified). [facility]  To contact the attending provider between 7A-7P or the covering provider during after hours 7P-7A, please log into the web site [email or web address] and access using universal [place] password for that web site. If you do not have the password, please call the hospital operator.

## 2021-04-01 NOTE — Progress Notes (Addendum)
°  Progress Note    04/01/2021 7:41 AM 1 Day Post-Op  Subjective:  denies stroke like symptoms overnight   Vitals:   04/01/21 0600 04/01/21 0723  BP:  123/60  Pulse:  72  Resp: 18 20  Temp:  99.1 F (37.3 C)  SpO2:  98%   Physical Exam: Lungs:  non labored Incisions:  L neck and R groin c/d/i Extremities:  moving all extremities well Neurologic: A&O; CN grossly intact  CBC    Component Value Date/Time   WBC 5.3 04/01/2021 0254   RBC 2.96 (L) 04/01/2021 0254   HGB 9.8 (L) 04/01/2021 0254   HGB 11.3 (L) 03/27/2021 1248   HGB 13.0 04/20/2012 0425   HCT 29.9 (L) 04/01/2021 0254   HCT 38.5 (L) 04/20/2012 0425   PLT 158 04/01/2021 0254   PLT 234 03/27/2021 1248   PLT 207 04/20/2012 0425   MCV 101.0 (H) 04/01/2021 0254   MCV 95 04/20/2012 0425   MCH 33.1 04/01/2021 0254   MCHC 32.8 04/01/2021 0254   RDW 13.1 04/01/2021 0254   RDW 13.2 04/20/2012 0425   LYMPHSABS 0.6 (L) 03/27/2021 1909   LYMPHSABS 0.9 (L) 04/20/2012 0425   MONOABS 0.6 03/27/2021 1909   MONOABS 0.6 04/20/2012 0425   EOSABS 0.1 03/27/2021 1909   EOSABS 0.2 04/20/2012 0425   BASOSABS 0.0 03/27/2021 1909   BASOSABS 0.0 04/20/2012 0425    BMET    Component Value Date/Time   NA 134 (L) 04/01/2021 0254   NA 141 04/20/2012 0425   K 3.8 04/01/2021 0254   K 4.1 04/20/2012 0425   CL 102 04/01/2021 0254   CL 109 (H) 04/20/2012 0425   CO2 21 (L) 04/01/2021 0254   CO2 28 04/20/2012 0425   GLUCOSE 96 04/01/2021 0254   GLUCOSE 144 (H) 04/20/2012 0425   BUN 9 04/01/2021 0254   BUN 14 04/20/2012 0425   CREATININE 0.75 04/01/2021 0254   CREATININE 0.77 03/27/2021 1248   CREATININE 0.95 04/20/2012 0425   CALCIUM 7.9 (L) 04/01/2021 0254   CALCIUM 8.2 (L) 04/20/2012 0425   GFRNONAA >60 04/01/2021 0254   GFRNONAA >60 03/27/2021 1248   GFRNONAA >60 04/20/2012 0425   GFRAA >60 11/16/2019 1407   GFRAA >60 04/20/2012 0425    INR    Component Value Date/Time   INR 1.1 03/27/2021 1909   INR 1.0  04/20/2012 0425     Intake/Output Summary (Last 24 hours) at 04/01/2021 0741 Last data filed at 04/01/2021 0725 Gross per 24 hour  Intake 2100 ml  Output 725 ml  Net 1375 ml     Assessment/Plan:  86 y.o. male is s/p L TCAR 1 Day Post-Op   Neuro status unchanged overnight Incisions are unremarkable Continue aspirin and plavix OOB Ok for d/c home form vascular standpoint; office will arrange carotid duplex in 4 weeks   Dagoberto Ligas, PA-C [facility] [phone] 04/01/2021 7:41 AM  VASCULAR STAFF ADDENDUM: I have independently interviewed and examined the patient. I agree with the above.  Doing well s/p TCAR. No deficits.  Can be discharged for vascular surgery prospective on DAPT and high intensity satin My office will set follow up   J. Melene Muller, MD [facility] Phone Number: [phone] 04/01/2021 10:53 AM

## 2021-04-01 NOTE — Progress Notes (Signed)
PHARMACIST LIPID MONITORING   Steven [name] is a 86 y.o. male admitted on 03/27/2021 with L TCAR.  Pharmacy has been consulted to optimize lipid-lowering therapy with the indication of secondary prevention for clinical ASCVD.  Recent Labs:  Lipid Panel (last 6 months):   Lab Results  Component Value Date   CHOL 89 04/01/2021   TRIG 87 04/01/2021   HDL 34 (L) 04/01/2021   CHOLHDL 2.6 04/01/2021   VLDL 17 04/01/2021   LDLCALC 38 04/01/2021    Hepatic function panel (last 6 months):   Lab Results  Component Value Date   AST 20 03/27/2021   ALT 18 03/27/2021   ALKPHOS 48 03/27/2021   BILITOT 0.6 03/27/2021    SCr (since admission):   Serum creatinine: 0.75 mg/dL 04/01/21 0254 Estimated creatinine clearance: 64 mL/min  Current therapy and lipid therapy tolerance Current lipid-lowering therapy: rosuvastatin 10mg  Previous lipid-lowering therapies (if applicable): simvastatin Documented or reported allergies or intolerances to lipid-lowering therapies (if applicable): n/a  Assessment:   Patient agrees with changes to lipid-lowering therapy. Discussed need for increasing to high dose statin. Patient states "let's give it a whirl".   Plan:    1.Statin intensity (high intensity recommended for all patients regardless of the LDL):  Add or increase statin to high intensity. Increase rosuvastatin to 20mg  daily  2.Add ezetimibe (if any one of the following):   Not indicated at this time.  3.Refer to lipid clinic:   No  4.Follow-up with:  Primary care provider - [name], MD  5.Follow-up labs after discharge:  Changes in lipid therapy were made. Check a lipid panel in 8-12 weeks then annually.     Erin Hearing PharmD., BCPS Clinical Pharmacist 04/01/2021 8:36 AM

## 2021-04-01 NOTE — Progress Notes (Signed)
Pt being d/c, VSS, IV removed, TED hose given per MD request, education complete.   Chrisandra Carota, RN 04/01/2021 2:32 PM

## 2021-04-01 NOTE — Discharge Instructions (Addendum)
[facility]  Discharge Instructions   Carotid Endarterectomy (CEA)  Please refer to the following instructions for your post-procedure care. Your surgeon or physician assistant will discuss any changes with you.  Activity  You are encouraged to walk as much as you can. You can slowly return to normal activities but must avoid strenuous activity and heavy lifting until your doctor tell you it's OK. Avoid activities such as vacuuming or swinging a golf club. You can drive after one week if you are comfortable and you are no longer taking prescription pain medications. It is normal to feel tired for serval weeks after your surgery. It is also normal to have difficulty with sleep habits, eating, and bowel movements after surgery. These will go away with time.  Bathing/Showering  You may shower after you come home. Do not soak in a bathtub, hot tub, or swim until the incision heals completely.  Incision Care  Shower every day. Clean your incision with mild soap and water. Pat the area dry with a clean towel. You do not need a bandage unless otherwise instructed. Do not apply any ointments or creams to your incision. You may have skin glue on your incision. Do not peel it off. It will come off on its own in about one week. Your incision may feel thickened and raised for several weeks after your surgery. This is normal and the skin will soften over time. For Men Only: It's OK to shave around the incision but do not shave the incision itself for 2 weeks. It is common to have numbness under your chin that could last for several months.  Diet  Resume your normal diet. There are no special food restrictions following this procedure. A low fat/low cholesterol diet is recommended for all patients with vascular disease. In order to heal from your surgery, it is CRITICAL to get adequate nutrition. Your body requires vitamins, minerals, and protein. Vegetables are the best  source of vitamins and minerals. Vegetables also provide the perfect balance of protein. Processed food has little nutritional value, so try to avoid this.        Medications  Resume taking all of your medications unless your doctor or physician assistant tells you not to. If your incision is causing pain, you may take over-the- counter pain relievers such as acetaminophen (Tylenol). If you were prescribed a stronger pain medication, please be aware these medications can cause nausea and constipation. Prevent nausea by taking the medication with a snack or meal. Avoid constipation by drinking plenty of fluids and eating foods with a high amount of fiber, such as fruits, vegetables, and grains. Do not take Tylenol if you are taking prescription pain medications.  Follow Up  Our office will schedule a follow up appointment 2-3 weeks following discharge.  Please call us immediately for any of the following conditions  Increased pain, redness, drainage (pus) from your incision site. Fever of 101 degrees or higher. If you should develop stroke (slurred speech, difficulty swallowing, weakness on one side of your body, loss of vision) you should call 911 and go to the nearest emergency room.  Reduce your risk of vascular disease:  Stop smoking. If you would like help call [facility] at 1-800-QUIT-NOW 253-670-9569) or [place] at [phone]. Manage your cholesterol Maintain a desired weight Control your diabetes Keep your blood pressure down  If you have any questions, please call the office at [phone].   Additional discharge instructions  Please get  your medications reviewed and adjusted by your Primary MD.  Please request your Primary MD to go over all Hospital Tests and Procedure/Radiological results at the follow up, please get all Hospital records sent to your Prim MD by signing hospital release before you go home.  If you had Pneumonia of Lung problems at the  Hospital: Please get a 2 view Chest X ray done in 6-8 weeks after hospital discharge or sooner if instructed by your Primary MD.  If you have Congestive Heart Failure: Please call your Cardiologist or Primary MD anytime you have any of the following symptoms:  1) 3 pound weight gain in 24 hours or 5 pounds in 1 week  2) shortness of breath, with or without a dry hacking cough  3) swelling in the hands, feet or stomach  4) if you have to sleep on extra pillows at night in order to breathe  Follow cardiac low salt diet and 1.5 lit/day fluid restriction.  If you have diabetes Accuchecks 4 times/day, Once in AM empty stomach and then before each meal. Log in all results and show them to your primary doctor at your next visit. If any glucose reading is under 80 or above 300 call your primary MD immediately.  If you have Seizure/Convulsions/Epilepsy: Please do not drive, operate heavy machinery, participate in activities at heights or participate in high speed sports until you have seen by Primary MD or a Neurologist and advised to do so again.  If you had Gastrointestinal Bleeding: Please ask your Primary MD to check a complete blood count within one week of discharge or at your next visit. Your endoscopic/colonoscopic biopsies that are pending at the time of discharge, will also need to followed by your Primary MD.  Get Medicines reviewed and adjusted. Please take all your medications with you for your next visit with your Primary MD  Please request your Primary MD to go over all hospital tests and procedure/radiological results at the follow up, please ask your Primary MD to get all Hospital records sent to his/her office.  If you experience worsening of your admission symptoms, develop shortness of breath, life threatening emergency, suicidal or homicidal thoughts you must seek medical attention immediately by calling 911 or calling your MD immediately  if symptoms less severe.  You must  read complete instructions/literature along with all the possible adverse reactions/side effects for all the Medicines you take and that have been prescribed to you. Take any new Medicines after you have completely understood and accpet all the possible adverse reactions/side effects.   Do not drive or operate heavy machinery when taking Pain medications.   Do not take more than prescribed Pain, Sleep and Anxiety Medications  Special Instructions: If you have smoked or chewed Tobacco  in the last 2 yrs please stop smoking, stop any regular Alcohol  and or any Recreational drug use.  Wear Seat belts while driving.  Please note You were cared for by a hospitalist during your hospital stay. If you have any questions about your discharge medications or the care you received while you were in the hospital after you are discharged, you can call the unit and asked to speak with the hospitalist on call if the hospitalist that took care of you is not available. Once you are discharged, your primary care physician will handle any further medical issues. Please note that NO REFILLS for any discharge medications will be authorized once you are discharged, as it is imperative that you return  to your primary care physician (or establish a relationship with a primary care physician if you do not have one) for your aftercare needs so that they can reassess your need for medications and monitor your lab values.  You can reach the hospitalist office at phone [phone] or fax [phone]   If you do not have a primary care physician, you can call [phone] for a physician referral.

## 2021-04-01 NOTE — Care Management Important Message (Signed)
Important Message  Patient Details  Name: TILER BRANDIS MRN: [id number] Date of Birth: 10-21-1934   Medicare Important Message Given:  Yes     Shelda Altes 04/01/2021, 9:51 AM

## 2021-04-03 LAB — POCT ACTIVATED CLOTTING TIME
Activated Clotting Time: 149 seconds
Activated Clotting Time: 239 seconds
Activated Clotting Time: 275 seconds
Activated Clotting Time: 257 seconds

## 2021-04-04 LAB — TYPE AND SCREEN
ABO/RH(D): A POS
Antibody Screen: POSITIVE
Donor AG Type: NEGATIVE
Donor AG Type: NEGATIVE
Unit division: 0
Unit division: 0

## 2021-04-04 LAB — BPAM RBC
Blood Product Expiration Date: 202212282359
Blood Product Expiration Date: 202301202359
ISSUE DATE / TIME: 202212221055
Unit Type and Rh: 600
Unit Type and Rh: 6200

## 2021-04-08 DIAGNOSIS — I6522 Occlusion and stenosis of left carotid artery: Secondary | ICD-10-CM | POA: Diagnosis not present

## 2021-04-19 ENCOUNTER — Other Ambulatory Visit: Payer: Self-pay

## 2021-04-19 DIAGNOSIS — I6529 Occlusion and stenosis of unspecified carotid artery: Secondary | ICD-10-CM

## 2021-04-24 NOTE — Progress Notes (Signed)
POST OPERATIVE OFFICE NOTE    CC:  F/u for surgery  HPI:  This is a 87 y.o. male who is s/p left TCAR on 03/31/2021 by Dr. Virl Cagey for symptomatic carotid artery stenosis (aphasia).  His post op course was unremarkable and he was discharged home on asa/plavix/statin.  Pt returns today for follow up.  Pt states he has been doing quite well.  He denies any amaurosis fugax, unilateral weakness, numbness or paralysis. He has trouble finding his words, but this has significantly improved.  He continues to take his plavix, asa and statin.   He does not smoke.   Allergies  Allergen Reactions   Other Hives   Indomethacin Hives and Other (See Comments)   Lactose Intolerance (Gi)     GI UPSET    Current Outpatient Medications  Medication Sig Dispense Refill   acetaminophen (TYLENOL) 650 MG CR tablet Take 1 tablet (650 mg total) by mouth every 8 (eight) hours as needed for pain or fever.     Artificial Tear Solution (SOOTHE XP) SOLN Place 1 drop into both eyes 2 (two) times daily.     aspirin EC 325 MG EC tablet Take 1 tablet (325 mg total) by mouth daily. 30 tablet 1   B Complex-C (B-COMPLEX WITH VITAMIN C) tablet Take 1 tablet by mouth daily.      Biotin 1 MG CAPS Take 1 capsule by mouth daily.      Calcium Carbonate-Vitamin D 600-400 MG-UNIT tablet Take 1 tablet by mouth 2 (two) times daily.      cholecalciferol (VITAMIN D) 1000 units tablet Take 1,000 Units by mouth daily.     citalopram (CELEXA) 20 MG tablet Take 20 mg by mouth daily.      clopidogrel (PLAVIX) 75 MG tablet Take 1 tablet (75 mg total) by mouth daily. 30 tablet 1   folic acid (FOLVITE) 1 MG tablet Take 1 mg by mouth in the morning and at bedtime.     ipratropium (ATROVENT) 0.06 % nasal spray Place 2 sprays into both nostrils 2 (two) times daily as needed (allergies).     methotrexate (RHEUMATREX) 2.5 MG tablet Take 20 mg by mouth once a week. TAKE 8 TABLETS (20 MG) BY MOUTH ONCE A WEEK.     midodrine (PROAMATINE) 10 MG  tablet Take 1 tablet (10 mg total) by mouth 2 (two) times daily with a meal. 30 tablet 1   Multiple Vitamins-Minerals (PRESERVISION AREDS 2 PO) Take 1 tablet by mouth in the morning and at bedtime.     pantoprazole (PROTONIX) 40 MG tablet Take 1 tablet (40 mg total) by mouth daily. 30 tablet 1   polyethylene glycol (MIRALAX / GLYCOLAX) 17 g packet Take 17 g by mouth daily as needed for mild constipation or moderate constipation (alaso available OTC). 30 each 0   rosuvastatin (CRESTOR) 20 MG tablet Take 1 tablet (20 mg total) by mouth at bedtime. 30 tablet 1   No current facility-administered medications for this visit.     ROS:  See HPI  Physical Exam:  Today's Vitals   05/02/21 1343 05/02/21 1345  BP: 114/67 129/72  Pulse: 61   Resp: 16   Temp: 98.6 F (37 C)   TempSrc: Temporal   Weight: 138 lb 12.8 oz (63 kg)   Height: 5\' 8"  (1.727 m)    Body mass index is 21.1 kg/m.   Incision:  healing nicely Extremities:  moving all extremities  Neuro: in tact; no speech difficulties during this  visit   Carotid duplex 05/02/2021: Summary:  Right Carotid: Velocities in the right ICA are consistent with a 1-39%  stenosis.   Left Carotid: Patent carotid stent with no evidence of significant  stenosis.   Vertebrals:  Bilateral vertebral arteries demonstrate antegrade flow.  Subclavians: Normal flow hemodynamics were seen in bilateral subclavian               arteries.    Assessment/Plan:  This is a 87 y.o. male who is s/p: left TCAR on 03/31/2021 by Dr. Virl Cagey for symptomatic carotid artery stenosis (aphasia).  -incision is healing nicely.  Pt states his speech issues have dramatically improved. -his duplex today reveals patent stent without stenosis and 1-39% right ICA stenosis.  -he will return in 9 months with carotid duplex with PA on Dr. Virl Cagey clinic day.  He knows that if he develops any stroke sx, he is to go to the ER.  He will call us sooner if he has any issues. -he  will continue his plavix/asa/statin.    Leontine Locket, [facility] [phone]   Clinic MD:  Virl Cagey

## 2021-05-02 ENCOUNTER — Encounter (HOSPITAL_COMMUNITY): Payer: Medicare Other

## 2021-05-02 ENCOUNTER — Ambulatory Visit (INDEPENDENT_AMBULATORY_CARE_PROVIDER_SITE_OTHER): Payer: Medicare Other | Admitting: Physician Assistant

## 2021-05-02 ENCOUNTER — Ambulatory Visit (HOSPITAL_COMMUNITY)
Admission: RE | Admit: 2021-05-02 | Discharge: 2021-05-02 | Disposition: A | Discharge status: Home / Self Care | Payer: Medicare Other | Source: Ambulatory Visit | Attending: Vascular Surgery | Admitting: Vascular Surgery

## 2021-05-02 ENCOUNTER — Encounter: Payer: Self-pay | Admitting: Physician Assistant

## 2021-05-02 ENCOUNTER — Other Ambulatory Visit: Payer: Self-pay

## 2021-05-02 VITALS — BP 129/72 | HR 61 | Temp 98.6°F | Resp 16 | Ht 68.0 in | Wt 138.8 lb

## 2021-05-02 DIAGNOSIS — I6529 Occlusion and stenosis of unspecified carotid artery: Secondary | ICD-10-CM | POA: Diagnosis present

## 2021-05-07 ENCOUNTER — Other Ambulatory Visit: Payer: Self-pay | Admitting: *Deleted

## 2021-05-07 DIAGNOSIS — I6529 Occlusion and stenosis of unspecified carotid artery: Secondary | ICD-10-CM

## 2021-05-20 DIAGNOSIS — G458 Other transient cerebral ischemic attacks and related syndromes: Secondary | ICD-10-CM | POA: Diagnosis not present

## 2021-05-20 DIAGNOSIS — R2689 Other abnormalities of gait and mobility: Secondary | ICD-10-CM | POA: Diagnosis not present

## 2021-05-20 DIAGNOSIS — M6281 Muscle weakness (generalized): Secondary | ICD-10-CM | POA: Diagnosis not present

## 2021-05-27 DIAGNOSIS — M6281 Muscle weakness (generalized): Secondary | ICD-10-CM | POA: Diagnosis not present

## 2021-05-27 DIAGNOSIS — R2689 Other abnormalities of gait and mobility: Secondary | ICD-10-CM | POA: Diagnosis not present

## 2021-05-27 DIAGNOSIS — G458 Other transient cerebral ischemic attacks and related syndromes: Secondary | ICD-10-CM | POA: Diagnosis not present

## 2021-06-03 ENCOUNTER — Inpatient Hospital Stay (HOSPITAL_COMMUNITY)
Admission: EM | Admit: 2021-06-03 | Discharge: 2021-06-06 | DRG: 814 | Disposition: A | Discharge status: Home / Self Care | Payer: Medicare Other | Attending: Family Medicine | Admitting: Family Medicine

## 2021-06-03 ENCOUNTER — Encounter (HOSPITAL_COMMUNITY): Payer: Self-pay | Admitting: Emergency Medicine

## 2021-06-03 ENCOUNTER — Emergency Department (HOSPITAL_COMMUNITY): Payer: Medicare Other

## 2021-06-03 DIAGNOSIS — Z803 Family history of malignant neoplasm of breast: Secondary | ICD-10-CM

## 2021-06-03 DIAGNOSIS — D649 Anemia, unspecified: Secondary | ICD-10-CM | POA: Diagnosis present

## 2021-06-03 DIAGNOSIS — Z800 Family history of malignant neoplasm of digestive organs: Secondary | ICD-10-CM | POA: Diagnosis not present

## 2021-06-03 DIAGNOSIS — M459 Ankylosing spondylitis of unspecified sites in spine: Secondary | ICD-10-CM | POA: Diagnosis present

## 2021-06-03 DIAGNOSIS — Z833 Family history of diabetes mellitus: Secondary | ICD-10-CM | POA: Diagnosis not present

## 2021-06-03 DIAGNOSIS — E785 Hyperlipidemia, unspecified: Secondary | ICD-10-CM | POA: Diagnosis not present

## 2021-06-03 DIAGNOSIS — R531 Weakness: Secondary | ICD-10-CM

## 2021-06-03 DIAGNOSIS — Z789 Other specified health status: Secondary | ICD-10-CM | POA: Diagnosis present

## 2021-06-03 DIAGNOSIS — Z66 Do not resuscitate: Secondary | ICD-10-CM | POA: Diagnosis present

## 2021-06-03 DIAGNOSIS — G9341 Metabolic encephalopathy: Secondary | ICD-10-CM | POA: Diagnosis not present

## 2021-06-03 DIAGNOSIS — Z8249 Family history of ischemic heart disease and other diseases of the circulatory system: Secondary | ICD-10-CM

## 2021-06-03 DIAGNOSIS — I959 Hypotension, unspecified: Secondary | ICD-10-CM | POA: Diagnosis present

## 2021-06-03 DIAGNOSIS — F32A Depression, unspecified: Secondary | ICD-10-CM | POA: Diagnosis not present

## 2021-06-03 DIAGNOSIS — Z8673 Personal history of transient ischemic attack (TIA), and cerebral infarction without residual deficits: Secondary | ICD-10-CM

## 2021-06-03 DIAGNOSIS — R6889 Other general symptoms and signs: Secondary | ICD-10-CM

## 2021-06-03 DIAGNOSIS — D72829 Elevated white blood cell count, unspecified: Secondary | ICD-10-CM

## 2021-06-03 DIAGNOSIS — D72828 Other elevated white blood cell count: Secondary | ICD-10-CM | POA: Diagnosis not present

## 2021-06-03 DIAGNOSIS — I739 Peripheral vascular disease, unspecified: Secondary | ICD-10-CM | POA: Diagnosis present

## 2021-06-03 DIAGNOSIS — E739 Lactose intolerance, unspecified: Secondary | ICD-10-CM | POA: Diagnosis not present

## 2021-06-03 DIAGNOSIS — E7800 Pure hypercholesterolemia, unspecified: Secondary | ICD-10-CM | POA: Diagnosis not present

## 2021-06-03 DIAGNOSIS — Z87891 Personal history of nicotine dependence: Secondary | ICD-10-CM | POA: Diagnosis not present

## 2021-06-03 DIAGNOSIS — D6489 Other specified anemias: Secondary | ICD-10-CM | POA: Diagnosis not present

## 2021-06-03 DIAGNOSIS — Z20822 Contact with and (suspected) exposure to covid-19: Secondary | ICD-10-CM | POA: Diagnosis present

## 2021-06-03 DIAGNOSIS — M457 Ankylosing spondylitis of lumbosacral region: Secondary | ICD-10-CM | POA: Diagnosis present

## 2021-06-03 DIAGNOSIS — Z7982 Long term (current) use of aspirin: Secondary | ICD-10-CM

## 2021-06-03 DIAGNOSIS — W19XXXA Unspecified fall, initial encounter: Secondary | ICD-10-CM | POA: Diagnosis not present

## 2021-06-03 DIAGNOSIS — F329 Major depressive disorder, single episode, unspecified: Secondary | ICD-10-CM | POA: Diagnosis present

## 2021-06-03 DIAGNOSIS — Z7902 Long term (current) use of antithrombotics/antiplatelets: Secondary | ICD-10-CM | POA: Diagnosis not present

## 2021-06-03 DIAGNOSIS — I6529 Occlusion and stenosis of unspecified carotid artery: Secondary | ICD-10-CM | POA: Diagnosis present

## 2021-06-03 DIAGNOSIS — E860 Dehydration: Secondary | ICD-10-CM | POA: Diagnosis present

## 2021-06-03 DIAGNOSIS — I951 Orthostatic hypotension: Secondary | ICD-10-CM

## 2021-06-03 DIAGNOSIS — G4733 Obstructive sleep apnea (adult) (pediatric): Secondary | ICD-10-CM | POA: Diagnosis present

## 2021-06-03 DIAGNOSIS — E782 Mixed hyperlipidemia: Secondary | ICD-10-CM | POA: Diagnosis present

## 2021-06-03 DIAGNOSIS — Z743 Need for continuous supervision: Secondary | ICD-10-CM | POA: Diagnosis not present

## 2021-06-03 DIAGNOSIS — R509 Fever, unspecified: Secondary | ICD-10-CM | POA: Diagnosis not present

## 2021-06-03 DIAGNOSIS — R197 Diarrhea, unspecified: Secondary | ICD-10-CM

## 2021-06-03 DIAGNOSIS — R6510 Systemic inflammatory response syndrome (SIRS) of non-infectious origin without acute organ dysfunction: Secondary | ICD-10-CM | POA: Diagnosis not present

## 2021-06-03 DIAGNOSIS — R748 Abnormal levels of other serum enzymes: Secondary | ICD-10-CM

## 2021-06-03 DIAGNOSIS — E876 Hypokalemia: Secondary | ICD-10-CM | POA: Diagnosis present

## 2021-06-03 DIAGNOSIS — F1090 Alcohol use, unspecified, uncomplicated: Secondary | ICD-10-CM | POA: Diagnosis present

## 2021-06-03 DIAGNOSIS — R32 Unspecified urinary incontinence: Secondary | ICD-10-CM | POA: Diagnosis present

## 2021-06-03 DIAGNOSIS — Y92009 Unspecified place in unspecified non-institutional (private) residence as the place of occurrence of the external cause: Secondary | ICD-10-CM

## 2021-06-03 DIAGNOSIS — M542 Cervicalgia: Secondary | ICD-10-CM | POA: Diagnosis not present

## 2021-06-03 DIAGNOSIS — I6782 Cerebral ischemia: Secondary | ICD-10-CM | POA: Diagnosis not present

## 2021-06-03 DIAGNOSIS — S0990XA Unspecified injury of head, initial encounter: Secondary | ICD-10-CM | POA: Diagnosis not present

## 2021-06-03 DIAGNOSIS — Z79899 Other long term (current) drug therapy: Secondary | ICD-10-CM

## 2021-06-03 LAB — HEPATIC FUNCTION PANEL
ALT: 16 U/L (ref 0–44)
AST: 21 U/L (ref 15–41)
Albumin: 3.9 g/dL (ref 3.5–5.0)
Alkaline Phosphatase: 47 U/L (ref 38–126)
Bilirubin, Direct: 0.2 mg/dL (ref 0.0–0.2)
Indirect Bilirubin: 0.6 mg/dL (ref 0.3–0.9)
Total Bilirubin: 0.8 mg/dL (ref 0.3–1.2)
Total Protein: 7.2 g/dL (ref 6.5–8.1)

## 2021-06-03 LAB — CBC WITH DIFFERENTIAL/PLATELET
Abs Immature Granulocytes: 0.05 10*3/uL (ref 0.00–0.07)
Basophils Absolute: 0 10*3/uL (ref 0.0–0.1)
Basophils Relative: 0 %
Eosinophils Absolute: 0 10*3/uL (ref 0.0–0.5)
Eosinophils Relative: 0 %
HCT: 35.1 % — ABNORMAL LOW (ref 39.0–52.0)
Hemoglobin: 11.4 g/dL — ABNORMAL LOW (ref 13.0–17.0)
Immature Granulocytes: 0 %
Lymphocytes Relative: 3 %
Lymphs Abs: 0.4 10*3/uL — ABNORMAL LOW (ref 0.7–4.0)
MCH: 32.6 pg (ref 26.0–34.0)
MCHC: 32.5 g/dL (ref 30.0–36.0)
MCV: 100.3 fL — ABNORMAL HIGH (ref 80.0–100.0)
Monocytes Absolute: 0.3 10*3/uL (ref 0.1–1.0)
Monocytes Relative: 3 %
Neutro Abs: 10.7 10*3/uL — ABNORMAL HIGH (ref 1.7–7.7)
Neutrophils Relative %: 94 %
Platelets: 239 10*3/uL (ref 150–400)
RBC: 3.5 MIL/uL — ABNORMAL LOW (ref 4.22–5.81)
RDW: 14.6 % (ref 11.5–15.5)
WBC: 11.4 10*3/uL — ABNORMAL HIGH (ref 4.0–10.5)
nRBC: 0 % (ref 0.0–0.2)

## 2021-06-03 LAB — COMPREHENSIVE METABOLIC PANEL
ALT: 16 U/L (ref 0–44)
AST: 20 U/L (ref 15–41)
Albumin: 3.8 g/dL (ref 3.5–5.0)
Alkaline Phosphatase: 48 U/L (ref 38–126)
Anion gap: 8 (ref 5–15)
BUN: 15 mg/dL (ref 8–23)
CO2: 24 mmol/L (ref 22–32)
Calcium: 8.5 mg/dL — ABNORMAL LOW (ref 8.9–10.3)
Chloride: 102 mmol/L (ref 98–111)
Creatinine, Ser: 0.83 mg/dL (ref 0.61–1.24)
GFR, Estimated: >60 mL/min (ref >60)
Glucose, Bld: 157 mg/dL — ABNORMAL HIGH (ref 70–99)
Potassium: 3.6 mmol/L (ref 3.5–5.1)
Sodium: 134 mmol/L — ABNORMAL LOW (ref 135–145)
Total Bilirubin: 1.1 mg/dL (ref 0.3–1.2)
Total Protein: 7.1 g/dL (ref 6.5–8.1)

## 2021-06-03 LAB — RESPIRATORY PANEL BY PCR

## 2021-06-03 LAB — LACTIC ACID, PLASMA: Lactic Acid, Venous: 1.9 mmol/L (ref 0.5–1.9)

## 2021-06-03 LAB — RESP PANEL BY RT-PCR (FLU A&B, COVID) ARPGX2
Influenza A by PCR: NEGATIVE
Influenza B by PCR: NEGATIVE
SARS Coronavirus 2 by RT PCR: NEGATIVE

## 2021-06-03 LAB — TSH: TSH: 1.417 u[IU]/mL (ref 0.350–4.500)

## 2021-06-03 LAB — URINALYSIS, ROUTINE W REFLEX MICROSCOPIC
Bilirubin Urine: NEGATIVE
Glucose, UA: NEGATIVE mg/dL
Hgb urine dipstick: NEGATIVE
Ketones, ur: NEGATIVE mg/dL
Leukocytes,Ua: NEGATIVE
Nitrite: NEGATIVE
Protein, ur: NEGATIVE mg/dL
Specific Gravity, Urine: 1.016 (ref 1.005–1.030)
pH: 5 (ref 5.0–8.0)

## 2021-06-03 LAB — MAGNESIUM: Magnesium: 1.5 mg/dL — ABNORMAL LOW (ref 1.7–2.4)

## 2021-06-03 LAB — SEDIMENTATION RATE: Sed Rate: 30 mm/hr — ABNORMAL HIGH (ref 0–16)

## 2021-06-03 LAB — PROTIME-INR
INR: 1.2 (ref 0.8–1.2)
Prothrombin Time: 14.8 seconds (ref 11.4–15.2)

## 2021-06-03 LAB — PROCALCITONIN: Procalcitonin: 0.17 ng/mL

## 2021-06-03 LAB — PHOSPHORUS: Phosphorus: 2.8 mg/dL (ref 2.5–4.6)

## 2021-06-03 LAB — CK: Total CK: 97 U/L (ref 49–397)

## 2021-06-03 MED ORDER — MAGNESIUM SULFATE 2 GM/50ML IV SOLN
2.0000 g | Freq: Once | INTRAVENOUS | Status: AC
  Administered 2021-06-03: 2 g via INTRAVENOUS
  Filled 2021-06-03: qty 50

## 2021-06-03 MED ORDER — SODIUM CHLORIDE 0.9 % IV SOLN
2.0000 g | Freq: Three times a day (TID) | INTRAVENOUS | Status: DC
  Administered 2021-06-03 – 2021-06-05 (×5): 2 g via INTRAVENOUS
  Filled 2021-06-03 (×5): qty 2

## 2021-06-03 MED ORDER — VANCOMYCIN HCL 1250 MG/250ML IV SOLN
1250.0000 mg | INTRAVENOUS | Status: DC
  Administered 2021-06-03: 22:00:00 1250 mg via INTRAVENOUS
  Filled 2021-06-03 (×2): qty 250

## 2021-06-03 MED ORDER — SODIUM CHLORIDE 0.9 % IV BOLUS
500.0000 mL | Freq: Once | INTRAVENOUS | Status: AC
Start: 2021-06-03 — End: 2021-06-03
  Administered 2021-06-03: 500 mL via INTRAVENOUS

## 2021-06-03 NOTE — ED Provider Triage Note (Signed)
Emergency Medicine Provider Triage Evaluation Note  Steven [name] , a 87 y.o. male  was evaluated in triage.  Pt complains of not feeling well since he went on a "hike" on Sunday, per EMS.  Today developed fever.  He denies any shortness of breath, cough.  Per EMS patient was incontinent of urine.  Patient has area of ecchymosis and skin tear to his left forearm.  Upon questioning he states he had a fall this morning.  He thinks he might of hit his head.  He is on Plavix.  He is unsure of his last tetanus.  Does have some neck pain.  Review of Systems  Positive: Fever, fall, urinary incontinence Negative: Chest pain, shortness of breath, back pain, abdominal pain, diarrhea  Physical Exam  BP (!) 116/50 (BP Location: Right Arm)    Pulse 85    Temp 99.3 F (37.4 C) (Oral)    Resp 18    SpO2 100%  Gen:   Awake, no distress   Resp:  Normal effort  MSK:   Moves extremities without difficulty, tenderness to left forearm with ecchymosis.  No midline C/T/L tenderness, lift bilateral legs off wheelchair. Other:  Alert to person, place, year  Medical Decision Making  Medically screening exam initiated at 3:24 PM.  Appropriate orders placed.  Steven [name] was informed that the remainder of the evaluation will be completed by another provider, this initial triage assessment does not replace that evaluation, and the importance of remaining in the ED until their evaluation is complete.  87 year old initially here for not feeling well after going on a hike however upon further questioning sounds like he had a fall earlier today and he is on Plavix.  Febrile with EMS, incontinent of urine.  Nursing aware patient needs room in back   Steven [name] A, PA-C 06/03/21 1529

## 2021-06-03 NOTE — ED Triage Notes (Addendum)
Per EMS-soreness from hike on Saturday-incontinent of urine and fever-possible UTI-per patient, states he fell this am and is on plavix-wife did not inform EMS

## 2021-06-03 NOTE — Assessment & Plan Note (Addendum)
-  SIRS criteria met with   elevated white blood cell count,       Component Value Date/Time   WBC 11.4 (H) 06/03/2021 1628   LYMPHSABS 0.4 (L) 06/03/2021 1628   LYMPHSABS 0.9 (L) 04/20/2012 0425     fever  RR >20 Today's Vitals   06/03/21 1945 06/03/21 2000 06/03/21 2030 06/03/21 2045  BP: (!) 141/56 (!) 135/56 (!) 133/56 133/85  Pulse: 76 76 74 75  Resp: 15 (!) 24 (!) 23 (!) 30  Temp:      TempSrc:      SpO2: 98% 98% 98% 95%  PainSc:       There is no height or weight on file to calculate BMI.     The recent clinical data is shown below. Vitals:   06/03/21 1945 06/03/21 2000 06/03/21 2030 06/03/21 2045  BP: (!) 141/56 (!) 135/56 (!) 133/56 133/85  Pulse: 76 76 74 75  Resp: 15 (!) 24 (!) 23 (!) 30  Temp:      TempSrc:      SpO2: 98% 98% 98% 95%        -Most likely source being   Source of sepsis is unknown but given clinical picture will continue to treat   Patient meeting criteria for Severe sepsis with    evidence of end organ damage/organ dysfunction such as   encephalopathy  - Obtain serial lactic acid and procalcitonin level.  - Initiated IV antibiotics in ER: Antibiotics Given (last 72 hours)    Date/Time Action Medication Dose Rate   06/03/21 2127 New Bag/Given   ceFEPIme (MAXIPIME) 2 g in sodium chloride 0.9 % 100 mL IVPB 2 g 200 mL/hr      Will continue      - await results of blood and urine culture  - Rehydrate aggressively  Intravenous fluids were administered,    HOLD METHOTREXATE  9:47 PM

## 2021-06-03 NOTE — Assessment & Plan Note (Signed)
Will replace ?

## 2021-06-03 NOTE — ED Provider Notes (Signed)
[facility] [place] DEPT Provider Note   CSN: [id number] Arrival date & time: 06/03/21  1517     History  Chief Complaint  Patient presents with   Steven [name] is a 87 y.o. male.  Presented to the emergency department with concern for fall, fever, generalized weakness.  Patient states for the past couple days he is felt generally fatigued, malaise.  Has noted some general soreness/achiness for the past couple days.  States that both arms and both legs feel weak.  Has been having difficulty walking due to the generalized weakness.  This morning fell while trying to get up.  Thinks that he may have hit his head though he was uncertain.  Also had his left arm.  Denies passing out.  Tmax at home was 101.  Denies any sick contacts.  History obtained from patient as well as from wife.  Wife reports she took his temperature this morning it was 101 fever.  Additional history obtained from review of chart, last admission in December 2022 for TIA, found to have left carotid artery stenosis and underwent transcarotid artery revascularization.  HPI     Home Medications Prior to Admission medications   Medication Sig Start Date End Date Taking? Authorizing Provider  acetaminophen (TYLENOL) 650 MG CR tablet Take 1 tablet (650 mg total) by mouth every 8 (eight) hours as needed for pain or fever. 04/01/21   Modena Jansky, MD  Artificial Tear Solution (SOOTHE XP) SOLN Place 1 drop into both eyes 2 (two) times daily.    [provider]  aspirin EC 325 MG EC tablet Take 1 tablet (325 mg total) by mouth daily. 04/02/21   Hongalgi, Lenis Dickinson, MD  B Complex-C (B-COMPLEX WITH VITAMIN C) tablet Take 1 tablet by mouth daily.     [provider]  Biotin 1 MG CAPS Take 1 capsule by mouth daily.     [provider]  Calcium Carbonate-Vitamin D 600-400 MG-UNIT tablet Take 1 tablet by mouth 2 (two) times daily.     [provider]   cholecalciferol (VITAMIN D) 1000 units tablet Take 1,000 Units by mouth daily.    [provider]  citalopram (CELEXA) 20 MG tablet Take 20 mg by mouth daily.     [provider]  clopidogrel (PLAVIX) 75 MG tablet Take 1 tablet (75 mg total) by mouth daily. 04/02/21   Hongalgi, Lenis Dickinson, MD  folic acid (FOLVITE) 1 MG tablet Take 1 mg by mouth in the morning and at bedtime.    [provider]  ipratropium (ATROVENT) 0.06 % nasal spray Place 2 sprays into both nostrils 2 (two) times daily as needed (allergies). 06/05/19   [provider]  methotrexate (RHEUMATREX) 2.5 MG tablet Take 20 mg by mouth once a week. TAKE 8 TABLETS (20 MG) BY MOUTH ONCE A WEEK.    [provider]  Multiple Vitamins-Minerals (PRESERVISION AREDS 2 PO) Take 1 tablet by mouth in the morning and at bedtime.    [provider]  pantoprazole (PROTONIX) 40 MG tablet Take 1 tablet (40 mg total) by mouth daily. 04/02/21   Hongalgi, Lenis Dickinson, MD  rosuvastatin (CRESTOR) 20 MG tablet Take 1 tablet (20 mg total) by mouth at bedtime. 04/01/21   Hongalgi, Lenis Dickinson, MD  Wheat Dextrin (BENEFIBER) POWD 1 packet or scoop 10/31/20   [provider]      Allergies    Other, Indomethacin, and Lactose intolerance (gi)  Review of Systems   Review of Systems  Constitutional:  Positive for chills, fatigue and fever.  HENT:  Negative for ear pain and sore throat.   Eyes:  Negative for pain and visual disturbance.  Respiratory:  Positive for cough. Negative for shortness of breath.   Cardiovascular:  Negative for chest pain and palpitations.  Gastrointestinal:  Negative for abdominal pain and vomiting.  Genitourinary:  Negative for dysuria and hematuria.  Musculoskeletal:  Positive for arthralgias and myalgias. Negative for back pain.  Skin:  Negative for color change and rash.  Neurological:  Positive for weakness. Negative for seizures and syncope.  All other systems reviewed and  are negative.  Physical Exam Updated Vital Signs BP 133/85    Pulse 75    Temp 99.3 F (37.4 C) (Oral)    Resp (!) 30    SpO2 95%  Physical Exam Vitals and nursing note reviewed.  Constitutional:      General: He is not in acute distress.    Appearance: He is well-developed.  HENT:     Head: Normocephalic and atraumatic.  Eyes:     Conjunctiva/sclera: Conjunctivae normal.  Cardiovascular:     Rate and Rhythm: Normal rate and regular rhythm.     Heart sounds: No murmur heard. Pulmonary:     Effort: Pulmonary effort is normal. No respiratory distress.     Breath sounds: Normal breath sounds.  Abdominal:     Palpations: Abdomen is soft.     Tenderness: There is no abdominal tenderness.  Musculoskeletal:        General: No swelling.     Cervical back: Neck supple.     Comments: Back: no C, T, L spine TTP, no step off or deformity RUE: no TTP throughout, no deformity, normal joint ROM, radial pulse intact, distal sensation and motor intact LUE: Small skin tear to left forearm, no active bleeding, normal joint ROM, radial pulse intact, distal sensation and motor intact RLE:  no TTP throughout, no deformity, normal joint ROM, distal pulse, sensation and motor intact LLE: no TTP throughout, no deformity, normal joint ROM, distal pulse, sensation and motor intact  Skin:    General: Skin is warm and dry.     Capillary Refill: Capillary refill takes less than 2 seconds.  Neurological:     Mental Status: He is alert.     Comments: AAOx3 CN 2-12 intact, speech clear visual fields intact 5/5 strength in b/l UE and LE Sensation to light touch intact in b/l UE and LE Normal FNF Normal patellar reflex b/l, no clonus  Psychiatric:        Mood and Affect: Mood normal.    ED Results / Procedures / Treatments   Labs (all labs ordered are listed, but only abnormal results are displayed) Labs Reviewed  CBC WITH DIFFERENTIAL/PLATELET - Abnormal; Notable for the following components:       Result Value   WBC 11.4 (*)    RBC 3.50 (*)    Hemoglobin 11.4 (*)    HCT 35.1 (*)    MCV 100.3 (*)    Neutro Abs 10.7 (*)    Lymphs Abs 0.4 (*)    All other components within normal limits  COMPREHENSIVE METABOLIC PANEL - Abnormal; Notable for the following components:   Sodium 134 (*)    Glucose, Bld 157 (*)    Calcium 8.5 (*)    All other components within normal limits  RESP PANEL BY RT-PCR (FLU A&B, COVID) ARPGX2  CULTURE, BLOOD (ROUTINE X 2)  CULTURE, BLOOD (ROUTINE X 2)  RESPIRATORY PANEL BY PCR  CULTURE, BLOOD (ROUTINE X 2)  CULTURE, BLOOD (ROUTINE X 2)  URINALYSIS, ROUTINE W REFLEX MICROSCOPIC  PROTIME-INR  CK  LACTIC ACID, PLASMA  PROCALCITONIN  PROCALCITONIN  HEPATIC FUNCTION PANEL  MAGNESIUM  PHOSPHORUS  TSH  SEDIMENTATION RATE  C-REACTIVE PROTEIN    EKG EKG Interpretation  Date/Time:  Tuesday June 03 2021 15:34:53 EST Ventricular Rate:  83 PR Interval:  217 QRS Duration: 140 QT Interval:  380 QTC Calculation: 447 R Axis:   85 Text Interpretation: Sinus rhythm Borderline prolonged PR interval Right bundle branch block No significant change since last tracing Confirmed by Dorie Rank [phone]) on 06/03/2021 3:48:04 PM  Radiology DG Chest 2 View  Result Date: 06/03/2021 CLINICAL DATA:  Provided history: Fever.  Fall this morning. EXAM: CHEST - 2 VIEW COMPARISON:  Prior chest radiographs 12/17/2020 and earlier. FINDINGS: Heart size within normal limits. Aortic atherosclerosis. The patient's chin partially obscures the left lung apex on the AP radiograph. No appreciable airspace consolidation. No evidence of pleural effusion or pneumothorax. No acute bony abnormality identified. Multilevel ventral syndesmophytes within the thoracic spine suggesting ankylosing spondylitis. IMPRESSION: The patient's chin partially obscures the left lung apex on the AP radiograph. No evidence of acute cardiopulmonary abnormality. Aortic Atherosclerosis (ICD10-I70.0).  Redemonstrated findings suggestive of ankylosing spondylitis. Electronically Signed   By: Kellie Simmering D.O.   On: 06/03/2021 15:58   DG Forearm Left  Result Date: 06/03/2021 CLINICAL DATA:  Fever, fell EXAM: LEFT FOREARM - 2 VIEW COMPARISON:  None. FINDINGS: There is no evidence of fracture or other focal bone lesions. Soft tissues are unremarkable. IMPRESSION: Negative. Electronically Signed   By: Lucrezia Europe M.D.   On: 06/03/2021 16:17   CT HEAD WO CONTRAST (5MM)  Result Date: 06/03/2021 CLINICAL DATA:  Head trauma, moderate-severe EXAM: CT HEAD WITHOUT CONTRAST TECHNIQUE: Contiguous axial images were obtained from the base of the skull through the vertex without intravenous contrast. RADIATION DOSE REDUCTION: This exam was performed according to the departmental dose-optimization program which includes automated exposure control, adjustment of the mA and/or kV according to patient size and/or use of iterative reconstruction technique. COMPARISON:  03/27/2021 FINDINGS: Brain: No evidence of acute infarction, hemorrhage, hydrocephalus, extra-axial collection or mass lesion/mass effect. Scattered low-density changes within the periventricular and subcortical white matter compatible with chronic microvascular ischemic change. Mild diffuse cerebral volume loss. Vascular: No hyperdense vessel or unexpected calcification. Skull: Normal. Negative for fracture or focal lesion. Sinuses/Orbits: No acute finding. Other: Negative for scalp hematoma. IMPRESSION: 1. No acute intracranial abnormality. 2. Chronic microvascular ischemic change and cerebral volume loss. Electronically Signed   By: Davina Poke D.O.   On: 06/03/2021 16:09   CT Cervical Spine Wo Contrast  Result Date: 06/03/2021 CLINICAL DATA:  Status post fall.  Neck pain. EXAM: CT CERVICAL SPINE WITHOUT CONTRAST TECHNIQUE: Multidetector CT imaging of the cervical spine was performed without intravenous contrast. Multiplanar CT image reconstructions  were also generated. RADIATION DOSE REDUCTION: This exam was performed according to the departmental dose-optimization program which includes automated exposure control, adjustment of the mA and/or kV according to patient size and/or use of iterative reconstruction technique. COMPARISON:  None. FINDINGS: Alignment: Anatomic alignment.  No static listhesis. Skull base and vertebrae: No acute fracture. No aggressive lytic or sclerotic osseous lesion. Old avulsion fracture along the posterior tip of the C7 spinous process. Soft tissues and spinal canal: Intraspinal soft tissues are not  fully imaged on this examination due to poor soft tissue contrast, but there is no gross soft tissue abnormality. No prevertebral fluid or swelling. No visible canal hematoma. Disc levels: Ankylosis of the cervicothoracic spine vertebral bodies and posterior elements from the occiput put through T3. The spine inferior to T3 is not included in the field of view. Upper chest: Lung apices are clear. Other: No fluid collection or hematoma. IMPRESSION: 1.  No acute osseous injury of the cervical spine. 2. Ankylosis of the cervicothoracic spine vertebral bodies and posterior elements from the occiput put through T3 consistent with ankylosing spondylitis. Electronically Signed   By: Kathreen Devoid M.D.   On: 06/03/2021 16:11    Procedures Procedures    Medications Ordered in ED Medications  ceFEPIme (MAXIPIME) 2 g in sodium chloride 0.9 % 100 mL IVPB (2 g Intravenous New Bag/Given 06/03/21 2127)  vancomycin (VANCOREADY) IVPB 1250 mg/250 mL (has no administration in time range)  sodium chloride 0.9 % bolus 500 mL (0 mLs Intravenous Stopped 06/03/21 1847)    ED Course/ Medical Decision Making/ A&P                           Medical Decision Making Amount and/or Complexity of Data Reviewed Labs: ordered.  Risk Decision regarding hospitalization.   87 year old gentleman presenting to ER with multiple different concerns including  fever, generalized weakness, fall.  Regarding fall, noted to have skin tear to left forearm but no other definite traumatic process identified.  CT head and C-spine were obtained given his report of possible head trauma and this was negative.  X-ray of forearm negative.  Independently reviewed and interpreted the CT and x-ray images.  Regarding the fever and generalized weakness and fatigue, obtain broad infectious work-up.  Noted mild leukocytosis.  UA negative for infection.  CXR negative for pneumonia.  Abdomen soft and nontender, no report of abdominal pain, doubt acute abdominal infection.  COVID and flu negative.  Exact cause of his fever at home and generalized weakness not clear at this time.  Will send broader respiratory viral panel and obtain blood cultures.  Patient's lactate is normal.  Will hold on antibiotics at this time given overall clinical picture.  On his neuro exam, he has no focal deficits, normal DTRs in lower extremities.  Patient did very poorly on ambulation trial, unable to walk.  Feel he would benefit from admission for further observation.  Discussed case with Dr. Roel Cluck who agrees to evaluate and admit patient.        Final Clinical Impression(s) / ED Diagnoses Final diagnoses:  Fall, initial encounter  Fever, unspecified fever cause  Leukocytosis, unspecified type  Generalized weakness    Rx / DC Orders ED Discharge Orders     None         Lucrezia Starch, MD 06/03/21 2132

## 2021-06-03 NOTE — Assessment & Plan Note (Signed)
Chronic-stable.

## 2021-06-03 NOTE — Assessment & Plan Note (Signed)
ICA stenosis  Sp endarterectomy cont plavix and aspirin

## 2021-06-03 NOTE — H&P (Signed)
Steven [name] JSR:[id number] DOB: 04-Aug-1934 DOA: 06/03/2021     PCP: Steven Pretty, MD   Outpatient Specialists:      Oncology   Dr. Geannie [name] Patient arrived to ER on 06/03/21 at 1517 Referred by Attending Steven Starch, MD   Patient coming from:    home Lives With family    Chief Complaint:   Chief Complaint  Patient presents with   Fall    HPI: Steven [name] is a 87 y.o. male with medical history significant of anemia , hx of SBO, PAD, TIA ICA Stenosis Asymptomatic    Presented with  feeling fatigued Patient has been having body aches has been incontinent of urine given the fever to point where he had a fall today.  Patient is on Plavix Family thought he was just sore from a hike No LOC unsure if hit his head He was getting up and got too weak his legs give out Had recent endarterectomy dec 2022 Has been tired for the past 5 days Reports fever No abd pain No cough no N/VD Not eating and drinking  No tobbaco drinks 2 beers a day   Initial COVID TEST  NEGATIVE   Lab Results  Component Value Date   SARSCOV2NAA NEGATIVE 06/03/2021   [place] NEGATIVE 03/27/2021   [place] NEGATIVE 02/28/2020   [place] NEGATIVE 11/02/2019     Regarding pertinent Chronic problems:     Hyperlipidemia -  on statins crestor  Lipid Panel     Component Value Date/Time   CHOL 89 04/01/2021 0254   TRIG 87 04/01/2021 0254   HDL 34 (L) 04/01/2021 0254   CHOLHDL 2.6 04/01/2021 0254   VLDL 17 04/01/2021 0254   LDLCALC 38 04/01/2021 0254   ICA stenosis sp Left TRANSCAROTID ARTERY REVASCULARIZATION  in Dec 2022 on Plavix     Eye inflamation on methotrexate       Hx of TIA-  with/out residual deficits on   Plavix   OSA  on CPAP    Chronic anemia - baseline hg Hemoglobin & Hematocrit  Recent Labs    03/31/21 1430 04/01/21 0254 06/03/21 1628  HGB 9.3* 9.8* 11.4*     While in ER:   Work US showing no source of infection     Ordered  CT  HEAD   NON acute CT neck  CXR -  NON acute   Following Medications were ordered in ER: Medications  sodium chloride 0.9 % bolus 500 mL (0 mLs Intravenous Stopped 06/03/21 1847)    ____________    ED Triage Vitals  Enc Vitals Group     BP 06/03/21 1523 (!) 116/50     Pulse Rate 06/03/21 1523 85     Resp 06/03/21 1523 18     Temp 06/03/21 1523 99.3 F (37.4 C)     Temp Source 06/03/21 1523 Oral     SpO2 06/03/21 1523 100 %     Weight --      Height --      Head Circumference --      Peak Flow --      Pain Score 06/03/21 1544 0     Pain Loc --      Pain Edu? --      Excl. in [place]? --   TMAX(24)@     _________________________________________ Significant initial  Findings: Abnormal Labs Reviewed  CBC WITH DIFFERENTIAL/PLATELET - Abnormal; Notable for the following components:  Result Value   WBC 11.4 (*)    RBC 3.50 (*)    Hemoglobin 11.4 (*)    HCT 35.1 (*)    MCV 100.3 (*)    Neutro Abs 10.7 (*)    Lymphs Abs 0.4 (*)    All other components within normal limits  COMPREHENSIVE METABOLIC PANEL - Abnormal; Notable for the following components:   Sodium 134 (*)    Glucose, Bld 157 (*)    Calcium 8.5 (*)    All other components within normal limits     _________________________ Troponin  ordered ECG: Ordered Personally reviewed by me showing: HR : 83 Rhythm:  NSR,    no evidence of ischemic changes QTC 447   ____________________ This patient meets SIRS Criteria and may be septic.    The recent clinical data is shown below. Vitals:   06/03/21 1930 06/03/21 1945 06/03/21 2000 06/03/21 2030  BP: (!) 126/58 (!) 141/56 (!) 135/56 (!) 133/56  Pulse: 79 76 76 74  Resp: 17 15 (!) 24 (!) 23  Temp:      TempSrc:      SpO2: 97% 98% 98% 98%    WBC     Component Value Date/Time   WBC 11.4 (H) 06/03/2021 1628   LYMPHSABS 0.4 (L) 06/03/2021 1628   LYMPHSABS 0.9 (L) 04/20/2012 0425   MONOABS 0.3 06/03/2021 1628   MONOABS 0.6 04/20/2012 0425   EOSABS 0.0  06/03/2021 1628   EOSABS 0.2 04/20/2012 0425   BASOSABS 0.0 06/03/2021 1628   BASOSABS 0.0 04/20/2012 0425    Lactic Acid, Venous    Component Value Date/Time   LATICACIDVEN 1.9 06/03/2021 1859    Procalcitonin 0.17     UA   no evidence of UTI      Urine analysis:    Component Value Date/Time   COLORURINE YELLOW 06/03/2021 1628   APPEARANCEUR CLEAR 06/03/2021 1628   APPEARANCEUR Hazy 04/18/2012 2324   LABSPEC 1.016 06/03/2021 1628   LABSPEC 1.018 04/18/2012 2324   PHURINE 5.0 06/03/2021 1628   GLUCOSEU NEGATIVE 06/03/2021 1628   GLUCOSEU Negative 04/18/2012 2324   HGBUR NEGATIVE 06/03/2021 1628   BILIRUBINUR NEGATIVE 06/03/2021 1628   BILIRUBINUR Negative 04/18/2012 2324   KETONESUR NEGATIVE 06/03/2021 1628   PROTEINUR NEGATIVE 06/03/2021 1628   UROBILINOGEN 0.2 04/09/2014 2138   NITRITE NEGATIVE 06/03/2021 1628   LEUKOCYTESUR NEGATIVE 06/03/2021 1628   LEUKOCYTESUR Negative 04/18/2012 2324    Results for orders placed or performed during the hospital encounter of 06/03/21  Resp Panel by RT-PCR (Flu A&B, Covid) Nasopharyngeal Swab     Status: None   Collection Time: 06/03/21  4:28 PM   Specimen: Nasopharyngeal Swab; Nasopharyngeal(NP) swabs in vial transport medium  Result Value Ref Range Status   SARS Coronavirus 2 by RT PCR NEGATIVE NEGATIVE Final         Influenza A by PCR NEGATIVE NEGATIVE Final   Influenza B by PCR NEGATIVE NEGATIVE Final          _______________________________________________ Hospitalist was called for admission for SIRS  The following Work up has been ordered so far:  Orders Placed This Encounter  Procedures   Resp Panel by RT-PCR (Flu A&B, Covid) Nasopharyngeal Swab   Blood culture (routine x 2)   Respiratory (~20 pathogens) panel by PCR   DG Chest 2 View   CT HEAD WO CONTRAST (5MM)   CT Cervical Spine Wo Contrast   DG Forearm Left   CBC with Differential   Comprehensive  metabolic panel   Urinalysis, Routine w reflex microscopic    Protime-INR   CK   Lactic acid, plasma   Procalcitonin - Baseline   Procalcitonin   In and Out Cath   Ambulate with assistance   Consult to hospitalist   ED EKG     OTHER Significant initial  Findings:  labs showing:    Recent Labs  Lab 06/03/21 1628  NA 134*  K 3.6  CO2 24  GLUCOSE 157*  BUN 15  CREATININE 0.83  CALCIUM 8.5*    Cr    stable,    Lab Results  Component Value Date   CREATININE 0.83 06/03/2021   CREATININE 0.75 04/01/2021   CREATININE 0.61 03/31/2021    Recent Labs  Lab 06/03/21 1628  AST 20  ALT 16  ALKPHOS 48  BILITOT 1.1  PROT 7.1  ALBUMIN 3.8   Lab Results  Component Value Date   CALCIUM 8.5 (L) 06/03/2021   PHOS 2.2 (L) 03/01/2020    Plt: Lab Results  Component Value Date   PLT 239 06/03/2021       Recent Labs  Lab 06/03/21 1628  WBC 11.4*  NEUTROABS 10.7*  HGB 11.4*  HCT 35.1*  MCV 100.3*  PLT 239    HG/HCT  stable,       Component Value Date/Time   HGB 11.4 (L) 06/03/2021 1628   HGB 11.3 (L) 03/27/2021 1248   HGB 13.0 04/20/2012 0425   HCT 35.1 (L) 06/03/2021 1628   HCT 38.5 (L) 04/20/2012 0425   MCV 100.3 (H) 06/03/2021 1628   MCV 95 04/20/2012 0425    Cardiac Panel (last 3 results) Recent Labs    06/03/21 1628  CKTOTAL 97    .car BNP (last 3 results) No results for input(s): BNP in the last 8760 hours.    DM  labs:  HbA1C: Recent Labs    03/28/21 0211  HGBA1C 6.4*       CBG (last 3)  No results for input(s): GLUCAP in the last 72 hours.        Cultures:    Component Value Date/Time   SDES URINE, CLEAN CATCH 03/30/2021 2000   SPECREQUEST NONE 03/30/2021 2000   CULT (A) 03/30/2021 2000    <10,000 COLONIES/mL INSIGNIFICANT GROWTH Performed at [facility], [place] [address] [place], [place] [address]    REPTSTATUS 03/31/2021 FINAL 03/30/2021 2000     Radiological Exams on Admission: DG Chest 2 View  Result Date: 06/03/2021 CLINICAL DATA:  Provided history: Fever.  Fall  this morning. EXAM: CHEST - 2 VIEW COMPARISON:  Prior chest radiographs 12/17/2020 and earlier. FINDINGS: Heart size within normal limits. Aortic atherosclerosis. The patient's chin partially obscures the left lung apex on the AP radiograph. No appreciable airspace consolidation. No evidence of pleural effusion or pneumothorax. No acute bony abnormality identified. Multilevel ventral syndesmophytes within the thoracic spine suggesting ankylosing spondylitis. IMPRESSION: The patient's chin partially obscures the left lung apex on the AP radiograph. No evidence of acute cardiopulmonary abnormality. Aortic Atherosclerosis (ICD10-I70.0). Redemonstrated findings suggestive of ankylosing spondylitis. Electronically Signed   By: Kellie Simmering D.O.   On: 06/03/2021 15:58   DG Forearm Left  Result Date: 06/03/2021 CLINICAL DATA:  Fever, fell EXAM: LEFT FOREARM - 2 VIEW COMPARISON:  None. FINDINGS: There is no evidence of fracture or other focal bone lesions. Soft tissues are unremarkable. IMPRESSION: Negative. Electronically Signed   By: Steven Europe M.D.   On: 06/03/2021 16:17  CT HEAD WO CONTRAST (5MM)  Result Date: 06/03/2021 CLINICAL DATA:  Head trauma, moderate-severe EXAM: CT HEAD WITHOUT CONTRAST TECHNIQUE: Contiguous axial images were obtained from the base of the skull through the vertex without intravenous contrast. RADIATION DOSE REDUCTION: This exam was performed according to the departmental dose-optimization program which includes automated exposure control, adjustment of the mA and/or kV according to patient size and/or use of iterative reconstruction technique. COMPARISON:  03/27/2021 FINDINGS: Brain: No evidence of acute infarction, hemorrhage, hydrocephalus, extra-axial collection or mass lesion/mass effect. Scattered low-density changes within the periventricular and subcortical white matter compatible with chronic microvascular ischemic change. Mild diffuse cerebral volume loss. Vascular: No  hyperdense vessel or unexpected calcification. Skull: Normal. Negative for fracture or focal lesion. Sinuses/Orbits: No acute finding. Other: Negative for scalp hematoma. IMPRESSION: 1. No acute intracranial abnormality. 2. Chronic microvascular ischemic change and cerebral volume loss. Electronically Signed   By: Davina Poke D.O.   On: 06/03/2021 16:09   CT Cervical Spine Wo Contrast  Result Date: 06/03/2021 CLINICAL DATA:  Status post fall.  Neck pain. EXAM: CT CERVICAL SPINE WITHOUT CONTRAST TECHNIQUE: Multidetector CT imaging of the cervical spine was performed without intravenous contrast. Multiplanar CT image reconstructions were also generated. RADIATION DOSE REDUCTION: This exam was performed according to the departmental dose-optimization program which includes automated exposure control, adjustment of the mA and/or kV according to patient size and/or use of iterative reconstruction technique. COMPARISON:  None. FINDINGS: Alignment: Anatomic alignment.  No static listhesis. Skull base and vertebrae: No acute fracture. No aggressive lytic or sclerotic osseous lesion. Old avulsion fracture along the posterior tip of the C7 spinous process. Soft tissues and spinal canal: Intraspinal soft tissues are not fully imaged on this examination due to poor soft tissue contrast, but there is no gross soft tissue abnormality. No prevertebral fluid or swelling. No visible canal hematoma. Disc levels: Ankylosis of the cervicothoracic spine vertebral bodies and posterior elements from the occiput put through T3. The spine inferior to T3 is not included in the field of view. Upper chest: Lung apices are clear. Other: No fluid collection or hematoma. IMPRESSION: 1.  No acute osseous injury of the cervical spine. 2. Ankylosis of the cervicothoracic spine vertebral bodies and posterior elements from the occiput put through T3 consistent with ankylosing spondylitis. Electronically Signed   By: Kathreen Devoid M.D.   On:  06/03/2021 16:11   _______________________________________________________________________________________________________ Latest  Blood pressure (!) 133/56, pulse 74, temperature 99.3 F (37.4 C), temperature source Oral, resp. rate (!) 23, SpO2 98 %.   Vitals  labs and radiology finding personally reviewed  Review of Systems:    Pertinent positives include:   Fevers, chills, fatigue,   Constitutional:  No weight loss, night sweats,weight loss  HEENT:  No headaches, Difficulty swallowing,Tooth/dental problems,Sore throat,  No sneezing, itching, ear ache, nasal congestion, post nasal drip,  Cardio-vascular:  No chest pain, Orthopnea, PND, anasarca, dizziness, palpitations.no Bilateral lower extremity swelling  GI:  No heartburn, indigestion, abdominal pain, nausea, vomiting, diarrhea, change in bowel habits, loss of appetite, melena, blood in stool, hematemesis Resp:  no shortness of breath at rest. No dyspnea on exertion, No excess mucus, no productive cough, No non-productive cough, No coughing up of blood.No change in color of mucus.No wheezing. Skin:  no rash or lesions. No jaundice GU:  no dysuria, change in color of urine, no urgency or frequency. No straining to urinate.  No flank pain.  Musculoskeletal:  No joint pain or no joint swelling.  No decreased range of motion. No back pain.  Psych:  No change in mood or affect. No depression or anxiety. No memory loss.  Neuro: no localizing neurological complaints, no tingling, no weakness, no double vision, no gait abnormality, no slurred speech, no confusion  All systems reviewed and apart from [place] all are negative _______________________________________________________________________________________________ Past Medical History:   Past Medical History:  Diagnosis Date   Allergy    enviromental   Anemia 2017   Anxiety    Arthritis    ankylosing spodilitis   Blood transfusion without reported diagnosis    during  surgery   Cataract    Depression    Dyspnea    with exertion - had Echo done 09/29/16   History of kidney stones    Hyperlipidemia    Intestinal obstruction ([place])    Pneumonia    as a child      Past Surgical History:  Procedure Laterality Date   ABDOMINAL SURGERY     had abcess   APPENDECTOMY     CHOLECYSTECTOMY     with lysis of adhesions   COLONOSCOPY     COLOSTOMY     COLOSTOMY REVERSAL     EYE SURGERY Left    scar tissue removed from cornea   EYE SURGERY Right    cataract surgery with lens implant   JOINT REPLACEMENT Right    hip  x 2 1999 and 2007   SHOULDER ARTHROSCOPY WITH ROTATOR CUFF REPAIR AND SUBACROMIAL DECOMPRESSION Left 03/02/2013   Procedure: LEFT SHOULDER ARTHROSCOPY WITH SUBACROMIAL DECOMPRESSION DISTAL CALVICLE RESECTION AND ROTATOR CUFF REPAIR ;  Surgeon: [name], MD;  Location: [facility];  Service: Orthopedics;  Laterality: Left;   TOTAL HIP REVISION Right 11/06/2016   Procedure: TOTAL HIP REVISION OF THE ACETABULAR COMPONENT;  Surgeon: [name], MD;  Location: [facility];  Service: Orthopedics;  Laterality: Right;   TRANSCAROTID ARTERY REVASCULARIZATION  Left 03/31/2021   Procedure: TRANSCAROTID ARTERY REVASCULARIZATION;  Surgeon: Broadus John, MD;  Location: [facility];  Service: Vascular;  Laterality: Left;   ULTRASOUND GUIDANCE FOR VASCULAR ACCESS Right 03/31/2021   Procedure: ULTRASOUND GUIDANCE FOR VASCULAR ACCESS;  Surgeon: Broadus John, MD;  Location: [facility];  Service: Vascular;  Laterality: Right;    Social History:  Ambulatory   independently      reports that he has quit smoking. He has never used smokeless tobacco. He reports current alcohol use. He reports that he does not use drugs.     Family History:   Family History  Problem Relation Age of Onset   Heart attack Mother    Diabetes Mother    Breast cancer Mother    Heart attack Maternal Grandfather    Pancreatic cancer Brother    Colon cancer Neg Hx    Esophageal cancer  Neg Hx    Stomach cancer Neg Hx    ______________________________________________________________________________________________ Allergies: Allergies  Allergen Reactions   Other Hives   Indomethacin Hives and Other (See Comments)   Lactose Intolerance (Gi)     GI UPSET     Prior to Admission medications   Medication Sig Start Date End Date Taking? Authorizing Provider  acetaminophen (TYLENOL) 650 MG CR tablet Take 1 tablet (650 mg total) by mouth every 8 (eight) hours as needed for pain or fever. 04/01/21   Modena Jansky, MD  Artificial Tear Solution (SOOTHE XP) SOLN Place 1 drop into both eyes 2 (two) times daily.    [provider]  aspirin EC 325 MG EC tablet Take 1 tablet (325 mg total) by mouth daily. 04/02/21   Hongalgi, Lenis Dickinson, MD  B Complex-C (B-COMPLEX WITH VITAMIN C) tablet Take 1 tablet by mouth daily.     [provider]  Biotin 1 MG CAPS Take 1 capsule by mouth daily.     [provider]  Calcium Carbonate-Vitamin D 600-400 MG-UNIT tablet Take 1 tablet by mouth 2 (two) times daily.     [provider]  cholecalciferol (VITAMIN D) 1000 units tablet Take 1,000 Units by mouth daily.    [provider]  citalopram (CELEXA) 20 MG tablet Take 20 mg by mouth daily.     [provider]  clopidogrel (PLAVIX) 75 MG tablet Take 1 tablet (75 mg total) by mouth daily. 04/02/21   Hongalgi, Lenis Dickinson, MD  folic acid (FOLVITE) 1 MG tablet Take 1 mg by mouth in the morning and at bedtime.    [provider]  ipratropium (ATROVENT) 0.06 % nasal spray Place 2 sprays into both nostrils 2 (two) times daily as needed (allergies). 06/05/19   [provider]  methotrexate (RHEUMATREX) 2.5 MG tablet Take 20 mg by mouth once a week. TAKE 8 TABLETS (20 MG) BY MOUTH ONCE A WEEK.    [provider]  Multiple Vitamins-Minerals (PRESERVISION AREDS 2 PO) Take 1 tablet by mouth in the morning and at bedtime.    [provider]  pantoprazole (PROTONIX) 40 MG tablet Take 1 tablet (40 mg total) by mouth daily. 04/02/21   Hongalgi, Lenis Dickinson, MD  rosuvastatin (CRESTOR) 20 MG tablet Take 1 tablet (20 mg total) by mouth at bedtime. 04/01/21   Hongalgi, Lenis Dickinson, MD  Wheat Dextrin (BENEFIBER) POWD 1 packet or scoop 10/31/20   [provider]    ___________________________________________________________________________________________________ Physical Exam: Vitals with BMI 06/03/2021 06/03/2021 06/03/2021  Height - - -  Weight - - -  BMI - - -  Systolic 161 096 045  Diastolic 56 56 56  Pulse 74 76 76     1. General:  in No  Acute distress   Chronically ill   -appearing 2. Psychological: Alert and  Oriented to self somewhat intermittently confused and HOH 3. Head/ENT:    Dry Mucous Membranes                          Head Non traumatic, neck supple                         Poor Dentition 4. SKIN:  decreased Skin turgor,  Skin clean Dry and intact no rash 5. Heart: Regular rate and rhythm no  Murmur, no Rub or gallop 6. Lungs:  no wheezes or crackles   7. Abdomen: Soft,  non-tender, Non distended   bowel sounds present 8. Lower extremities: no clubbing, cyanosis, no  edema 9. Neurologically  strength 5 out of 5 in all 4 extremities cranial nerves II through XII intact ? asterexis 10. MSK: Normal range of motion    Chart has been reviewed  ______________________________________________________________________________________________  Assessment/Plan 87 y.o. male with medical history significant of anemia , hx of SBO, PAD, TIA ICA Stenosis Asymptomatic  Admitted for SIRS  Present on Admission:  SIRS (systemic inflammatory response syndrome) (HCC)  Hyperlipidemia  Anemia  Dehydration  Acute metabolic encephalopathy  PAD (peripheral artery disease) (HCC)  Alcohol use  Hypomagnesemia     Hyperlipidemia  Chronic continue Crestor  Anemia Chronic stable  SIRS (systemic inflammatory  response syndrome) (HCC)  -SIRS criteria met with   elevated white blood cell count,       Component Value Date/Time   WBC 11.4 (H) 06/03/2021 1628   LYMPHSABS 0.4 (L) 06/03/2021 1628   LYMPHSABS 0.9 (L) 04/20/2012 0425     fever  RR >20 Today's Vitals   06/03/21 1945 06/03/21 2000 06/03/21 2030 06/03/21 2045  BP: (!) 141/56 (!) 135/56 (!) 133/56 133/85  Pulse: 76 76 74 75  Resp: 15 (!) 24 (!) 23 (!) 30  Temp:      TempSrc:      SpO2: 98% 98% 98% 95%  PainSc:       There is no height or weight on file to calculate BMI.     The recent clinical data is shown below. Vitals:   06/03/21 1945 06/03/21 2000 06/03/21 2030 06/03/21 2045  BP: (!) 141/56 (!) 135/56 (!) 133/56 133/85  Pulse: 76 76 74 75  Resp: 15 (!) 24 (!) 23 (!) 30  Temp:      TempSrc:      SpO2: 98% 98% 98% 95%        -Most likely source being   Source of sepsis is unknown but given clinical picture will continue to treat   Patient meeting criteria for Severe sepsis with    evidence of end organ damage/organ dysfunction such as   encephalopathy  - Obtain serial lactic acid and procalcitonin level.  - Initiated IV antibiotics in ER: Antibiotics Given (last 72 hours)     Date/Time Action Medication Dose Rate   06/03/21 2127 New Bag/Given   ceFEPIme (MAXIPIME) 2 g in sodium chloride 0.9 % 100 mL IVPB 2 g 200 mL/hr       Will continue      - await results of blood and urine culture  - Rehydrate aggressively  Intravenous fluids were administered,    HOLD METHOTREXATE  9:47 PM   Dehydration Will rehydrate and follow fluid status  Acute metabolic encephalopathy   - most likely multifactorial secondary to combination of  Possible infection mild dehydration secondary to decreased by mouth intake   - Will rehydrate   - treat underlining infection   - Hold contributing medications   - if no improvement may need further imaging to evaluate for CNS pathology pathology such as MRI of the brain   -  neurological exam appears to be nonfocal    - VBG  And ammonia ordered     PAD (peripheral artery disease) (HCC) ICA stenosis  Sp endarterectomy cont plavix and aspirin  Alcohol use Pt states few beers a day last was 1 wk ago no evidence of withdrawal  Hypomagnesemia Will replace   Other plan as per orders.  DVT prophylaxis:  SCD        Code Status:  DNR/DNI as per patient   I had personally discussed CODE STATUS with patient and      Family Communication:   Family not at  Bedside    Disposition Plan:      To home once workup is complete and patient is stable   Following barriers for discharge:                            Electrolytes corrected  Would benefit from PT/OT eval prior to DC  Ordered                     Consults called: none  Admission status:  ED Disposition     ED Disposition  Admit   Condition  --   [place]: [place] [[id number]]  Level of Care: Telemetry [5]  Admit to tele based on following criteria: Other see comments  Comments: sirs  May place patient in observation at [facility] or [facility] if equivalent level of care is available:: No  Covid Evaluation: Confirmed COVID Negative  Diagnosis: SIRS (systemic inflammatory response syndrome) [facility]) [[id number]]  Admitting Physician: Toy Baker [[id number]]  Attending Physician: Toy Baker [[id number]]           Obs      Level of care     tele  For 12H    Lab Results  Component Value Date   [place] 06/03/2021     Precautions: admitted as   Covid Negative      Inara Dike 06/03/2021, 11:02 PM    [facility]     after 2 AM please page floor coverage PA If 7AM-7PM, please contact the day team taking care of the patient using [email or web address]   Patient was evaluated in the context of the global COVID-19 pandemic, which necessitated consideration that the patient might be at risk for infection with the  SARS-CoV-2 virus that causes COVID-19. Institutional protocols and algorithms that pertain to the evaluation of patients at risk for COVID-19 are in a state of rapid change based on information released by regulatory bodies including the CDC and federal and state organizations. These policies and algorithms were followed during the patient's care.

## 2021-06-03 NOTE — Progress Notes (Signed)
Pharmacy Antibiotic Note  Steven [name] is a 87 y.o. male admitted on 06/03/2021 with suspected sepsis, unknown source.  Pharmacy has been consulted for cefepime and vancomycin dosing.  Plan: Cefepime 2 g IV every 8 hours Vancomycin 1250 mg IV Q 24 hrs (Goal AUC 400-550, Expected AUC: 478.4, SCr used: 0.83) Monitor clinical progress, renal function, vancomycin level if indicated F/U C&S, abx deescalation / LOT       Temp (24hrs), Avg:99.3 F (37.4 C), Min:99.3 F (37.4 C), Max:99.3 F (37.4 C)  Recent Labs  Lab 06/03/21 1628 06/03/21 1859  WBC 11.4*  --   CREATININE 0.83  --   LATICACIDVEN  --  1.9    CrCl cannot be calculated (Unknown ideal weight.).   Estimated CrCl 67 ml/min using weight of 64 kg from 05/02/2021  Allergies  Allergen Reactions   Other Hives   Indomethacin Hives and Other (See Comments)   Lactose Intolerance (Gi)     GI UPSET    Antimicrobials this admission: 2/21 cefepime >> 2/21 vancomycin >>  Microbiology results: 2/21 BCx: sent   Thank you for allowing pharmacy to be a part of this patients care.  Royetta Asal, PharmD, BCPS Clinical Pharmacist [place] Please utilize Amion for appropriate phone number to reach the unit pharmacist ([place]) 06/03/2021 9:23 PM

## 2021-06-03 NOTE — Assessment & Plan Note (Signed)
Pt states few beers a day last was 1 wk ago no evidence of withdrawal

## 2021-06-03 NOTE — Subjective & Objective (Signed)
Patient has been having body aches has been incontinent of urine given the fever to point where he had a fall today.  Patient is on Plavix Family thought he was just sore from a hike No LOC unsure if hit his head He was getting up and got too weak his legs give out

## 2021-06-03 NOTE — Assessment & Plan Note (Addendum)
-   most likely multifactorial secondary to combination of  Possible infection mild dehydration secondary to decreased by mouth intake   - Will rehydrate   - treat underlining infection   - Hold contributing medications   - if no improvement may need further imaging to evaluate for CNS pathology pathology such as MRI of the brain   - neurological exam appears to be nonfocal    - VBG  And ammonia ordered

## 2021-06-03 NOTE — Assessment & Plan Note (Signed)
Will rehydrate and follow fluid status

## 2021-06-03 NOTE — Assessment & Plan Note (Addendum)
Chronic continue Crestor

## 2021-06-04 ENCOUNTER — Encounter (HOSPITAL_COMMUNITY): Payer: Self-pay | Admitting: Internal Medicine

## 2021-06-04 ENCOUNTER — Other Ambulatory Visit: Payer: Self-pay

## 2021-06-04 ENCOUNTER — Ambulatory Visit: Payer: Medicare Other | Admitting: Neurology

## 2021-06-04 DIAGNOSIS — R6510 Systemic inflammatory response syndrome (SIRS) of non-infectious origin without acute organ dysfunction: Secondary | ICD-10-CM | POA: Diagnosis present

## 2021-06-04 DIAGNOSIS — F32A Depression, unspecified: Secondary | ICD-10-CM | POA: Diagnosis present

## 2021-06-04 DIAGNOSIS — Z7982 Long term (current) use of aspirin: Secondary | ICD-10-CM | POA: Diagnosis not present

## 2021-06-04 DIAGNOSIS — Z20822 Contact with and (suspected) exposure to covid-19: Secondary | ICD-10-CM | POA: Diagnosis present

## 2021-06-04 DIAGNOSIS — R531 Weakness: Secondary | ICD-10-CM

## 2021-06-04 DIAGNOSIS — R509 Fever, unspecified: Secondary | ICD-10-CM | POA: Diagnosis present

## 2021-06-04 DIAGNOSIS — I959 Hypotension, unspecified: Secondary | ICD-10-CM

## 2021-06-04 DIAGNOSIS — Z66 Do not resuscitate: Secondary | ICD-10-CM | POA: Diagnosis present

## 2021-06-04 DIAGNOSIS — Z8673 Personal history of transient ischemic attack (TIA), and cerebral infarction without residual deficits: Secondary | ICD-10-CM | POA: Diagnosis not present

## 2021-06-04 DIAGNOSIS — Z87891 Personal history of nicotine dependence: Secondary | ICD-10-CM | POA: Diagnosis not present

## 2021-06-04 DIAGNOSIS — E739 Lactose intolerance, unspecified: Secondary | ICD-10-CM | POA: Diagnosis present

## 2021-06-04 DIAGNOSIS — G4733 Obstructive sleep apnea (adult) (pediatric): Secondary | ICD-10-CM | POA: Diagnosis present

## 2021-06-04 DIAGNOSIS — Z800 Family history of malignant neoplasm of digestive organs: Secondary | ICD-10-CM | POA: Diagnosis not present

## 2021-06-04 DIAGNOSIS — D72828 Other elevated white blood cell count: Secondary | ICD-10-CM | POA: Diagnosis present

## 2021-06-04 DIAGNOSIS — I951 Orthostatic hypotension: Secondary | ICD-10-CM

## 2021-06-04 DIAGNOSIS — M457 Ankylosing spondylitis of lumbosacral region: Secondary | ICD-10-CM | POA: Diagnosis present

## 2021-06-04 DIAGNOSIS — G9341 Metabolic encephalopathy: Secondary | ICD-10-CM | POA: Diagnosis present

## 2021-06-04 DIAGNOSIS — D6489 Other specified anemias: Secondary | ICD-10-CM | POA: Diagnosis present

## 2021-06-04 DIAGNOSIS — I739 Peripheral vascular disease, unspecified: Secondary | ICD-10-CM | POA: Diagnosis present

## 2021-06-04 DIAGNOSIS — E860 Dehydration: Secondary | ICD-10-CM | POA: Diagnosis present

## 2021-06-04 DIAGNOSIS — Z803 Family history of malignant neoplasm of breast: Secondary | ICD-10-CM | POA: Diagnosis not present

## 2021-06-04 DIAGNOSIS — W19XXXA Unspecified fall, initial encounter: Secondary | ICD-10-CM

## 2021-06-04 DIAGNOSIS — Z79899 Other long term (current) drug therapy: Secondary | ICD-10-CM | POA: Diagnosis not present

## 2021-06-04 DIAGNOSIS — Z7902 Long term (current) use of antithrombotics/antiplatelets: Secondary | ICD-10-CM | POA: Diagnosis not present

## 2021-06-04 DIAGNOSIS — E785 Hyperlipidemia, unspecified: Secondary | ICD-10-CM | POA: Diagnosis present

## 2021-06-04 DIAGNOSIS — Z8249 Family history of ischemic heart disease and other diseases of the circulatory system: Secondary | ICD-10-CM | POA: Diagnosis not present

## 2021-06-04 DIAGNOSIS — R6889 Other general symptoms and signs: Secondary | ICD-10-CM

## 2021-06-04 DIAGNOSIS — Z833 Family history of diabetes mellitus: Secondary | ICD-10-CM | POA: Diagnosis not present

## 2021-06-04 DIAGNOSIS — Y92009 Unspecified place in unspecified non-institutional (private) residence as the place of occurrence of the external cause: Secondary | ICD-10-CM

## 2021-06-04 LAB — CBC WITH DIFFERENTIAL/PLATELET
Abs Immature Granulocytes: 0.03 10*3/uL (ref 0.00–0.07)
Basophils Absolute: 0 10*3/uL (ref 0.0–0.1)
Basophils Relative: 0 %
Eosinophils Absolute: 0 10*3/uL (ref 0.0–0.5)
Eosinophils Relative: 0 %
HCT: 31 % — ABNORMAL LOW (ref 39.0–52.0)
Hemoglobin: 10.1 g/dL — ABNORMAL LOW (ref 13.0–17.0)
Immature Granulocytes: 0 %
Lymphocytes Relative: 5 %
Lymphs Abs: 0.4 10*3/uL — ABNORMAL LOW (ref 0.7–4.0)
MCH: 32.4 pg (ref 26.0–34.0)
MCHC: 32.6 g/dL (ref 30.0–36.0)
MCV: 99.4 fL (ref 80.0–100.0)
Monocytes Absolute: 0.3 10*3/uL (ref 0.1–1.0)
Monocytes Relative: 4 %
Neutro Abs: 6.7 10*3/uL (ref 1.7–7.7)
Neutrophils Relative %: 91 %
Platelets: 179 10*3/uL (ref 150–400)
RBC: 3.12 MIL/uL — ABNORMAL LOW (ref 4.22–5.81)
RDW: 14.6 % (ref 11.5–15.5)
WBC: 7.4 10*3/uL (ref 4.0–10.5)
nRBC: 0 % (ref 0.0–0.2)

## 2021-06-04 LAB — AMMONIA: Ammonia: 12 umol/L (ref 9–35)

## 2021-06-04 LAB — COMPREHENSIVE METABOLIC PANEL
ALT: 18 U/L (ref 0–44)
AST: 36 U/L (ref 15–41)
Albumin: 3.3 g/dL — ABNORMAL LOW (ref 3.5–5.0)
Alkaline Phosphatase: 41 U/L (ref 38–126)
Anion gap: 7 (ref 5–15)
BUN: 13 mg/dL (ref 8–23)
CO2: 22 mmol/L (ref 22–32)
Calcium: 7.8 mg/dL — ABNORMAL LOW (ref 8.9–10.3)
Chloride: 101 mmol/L (ref 98–111)
Creatinine, Ser: 0.71 mg/dL (ref 0.61–1.24)
GFR, Estimated: >60 mL/min (ref >60)
Glucose, Bld: 128 mg/dL — ABNORMAL HIGH (ref 70–99)
Potassium: 3 mmol/L — ABNORMAL LOW (ref 3.5–5.1)
Sodium: 130 mmol/L — ABNORMAL LOW (ref 135–145)
Total Bilirubin: 1.1 mg/dL (ref 0.3–1.2)
Total Protein: 6.4 g/dL — ABNORMAL LOW (ref 6.5–8.1)

## 2021-06-04 LAB — BLOOD GAS, VENOUS
Acid-Base Excess: 0.1 mmol/L (ref 0.0–2.0)
Bicarbonate: 23.8 mmol/L (ref 20.0–28.0)
O2 Saturation: 95.5 %
Patient temperature: 37
pCO2, Ven: 35 mmHg — ABNORMAL LOW (ref 44–60)
pH, Ven: 7.44 — ABNORMAL HIGH (ref 7.25–7.43)
pO2, Ven: 68 mmHg — ABNORMAL HIGH (ref 32–45)

## 2021-06-04 LAB — PHOSPHORUS: Phosphorus: 2.4 mg/dL — ABNORMAL LOW (ref 2.5–4.6)

## 2021-06-04 LAB — MAGNESIUM: Magnesium: 2.2 mg/dL (ref 1.7–2.4)

## 2021-06-04 LAB — C-REACTIVE PROTEIN: CRP: 7.9 mg/dL — ABNORMAL HIGH (ref <1.0)

## 2021-06-04 LAB — PROCALCITONIN: Procalcitonin: 0.14 ng/mL

## 2021-06-04 MED ORDER — HYDROCODONE-ACETAMINOPHEN 5-325 MG PO TABS: 1.0000 | ORAL_TABLET | ORAL | Status: DC | PRN

## 2021-06-04 MED ORDER — ROSUVASTATIN CALCIUM 20 MG PO TABS
20.0000 mg | ORAL_TABLET | Freq: Every day | ORAL | Status: DC
  Administered 2021-06-04 (×2): 20 mg via ORAL
  Filled 2021-06-04: qty 1

## 2021-06-04 MED ORDER — ACETAMINOPHEN 650 MG RE SUPP: 650.0000 mg | Freq: Four times a day (QID) | RECTAL | Status: DC | PRN

## 2021-06-04 MED ORDER — IPRATROPIUM BROMIDE 0.06 % NA SOLN: 2.0000 | Freq: Two times a day (BID) | NASAL | Status: DC | PRN

## 2021-06-04 MED ORDER — ASPIRIN EC 325 MG PO TBEC
325.0000 mg | DELAYED_RELEASE_TABLET | Freq: Every day | ORAL | Status: DC
  Administered 2021-06-04 – 2021-06-06 (×3): 325 mg via ORAL
  Filled 2021-06-04 (×3): qty 1

## 2021-06-04 MED ORDER — POTASSIUM CHLORIDE CRYS ER 20 MEQ PO TBCR
40.0000 meq | EXTENDED_RELEASE_TABLET | ORAL | Status: AC
  Administered 2021-06-04 (×2): 40 meq via ORAL
  Filled 2021-06-04 (×2): qty 2

## 2021-06-04 MED ORDER — SODIUM CHLORIDE 0.9 % IV SOLN: INTRAVENOUS | Status: DC | PRN

## 2021-06-04 MED ORDER — CLOPIDOGREL BISULFATE 75 MG PO TABS
75.0000 mg | ORAL_TABLET | Freq: Every day | ORAL | Status: DC
  Administered 2021-06-04 – 2021-06-06 (×3): 75 mg via ORAL
  Filled 2021-06-04 (×3): qty 1

## 2021-06-04 MED ORDER — ACETAMINOPHEN 325 MG PO TABS
650.0000 mg | ORAL_TABLET | Freq: Four times a day (QID) | ORAL | Status: DC | PRN
  Administered 2021-06-04: 650 mg via ORAL
  Filled 2021-06-04: qty 2

## 2021-06-04 MED ORDER — LACTATED RINGERS IV BOLUS
500.0000 mL | Freq: Once | INTRAVENOUS | Status: AC
  Administered 2021-06-04: 500 mL via INTRAVENOUS

## 2021-06-04 MED ORDER — CITALOPRAM HYDROBROMIDE 20 MG PO TABS
20.0000 mg | ORAL_TABLET | Freq: Every day | ORAL | Status: DC
  Administered 2021-06-04 – 2021-06-06 (×3): 20 mg via ORAL
  Filled 2021-06-04 (×2): qty 1
  Filled 2021-06-04: qty 2

## 2021-06-04 NOTE — Assessment & Plan Note (Addendum)
Appears chronic, follow anemia labs With b12 and iron def Will discharge with supplementation

## 2021-06-04 NOTE — Assessment & Plan Note (Addendum)
Leukocytosis, tachypnea.  Occasionally soft BP.  Reported fevers at home.   Sepsis ruled out CXR without acute cardiopulm abnormality, UA bland Negative covid, influenza Negative RVP He reports what sounds like rigors, will follow blood cultures and continue abx for now S/p broad spectrum abx - will discharge off abx Will wait for 48 hr blood cx negative with hx rigors and immunosuppressed on methotrexate Blood cx NGx2 days, will discharge off abx Return precautions given, may need additional w/u outpatient (CT, etc) if recurrent symptoms

## 2021-06-04 NOTE — Assessment & Plan Note (Addendum)
improved

## 2021-06-04 NOTE — Evaluation (Addendum)
Occupational Therapy Evaluation Patient Details Name: Steven [name] MRN: [id number] DOB: 05/26/1934 Today's Date: 06/04/2021   History of Present Illness Patient is an 87 year old male admited with fatigue, body aches and recent fall due to weakness. Dx with dehydration and metabolic encephalopathy. PMH: SBO, PAD, TIA ICA, ankylosing spondylitis   Clinical Impression    Patient lives at home with spouse and is independent at baseline. Patient demonstrates ability to ambulate to/from bathroom twice while pushing IV pole, perform peri care and stand at sink side to perform hand hygiene without physical assistance. Supervision for safety due to IV pole and condom catheter. Patient denied dizziness throughout session with BP 105/53 at edge of bed. No further acute OT needs at this time, will sign off. Please re-consult if new needs arise.     Recommendations for follow up therapy are one component of a multi-disciplinary discharge planning process, led by the attending physician.  Recommendations may be updated based on patient status, additional functional criteria and insurance authorization.   Follow Up Recommendations  No OT follow up    Assistance Recommended at Discharge PRN     Functional Status Assessment  Patient has not had a recent decline in their functional status  Equipment Recommendations  None recommended by OT       Precautions / Restrictions Precautions Precautions: None Restrictions Weight Bearing Restrictions: No      Mobility Bed Mobility Overal bed mobility: Modified Independent             General bed mobility comments: HOB elevated        Balance Overall balance assessment: Mild deficits observed, not formally tested                                         ADL either performed or assessed with clinical judgement   ADL Overall ADL's : At baseline                                       General ADL Comments:  Patient overall supervision level for safety managing IV pole. Patient able to transfer on/off toilet twice due to diarrhea. Performed peri care without assist. Washed hands at sink. No loss of balance noted.      Pertinent Vitals/Pain Pain Assessment Pain Assessment: No/denies pain     Hand Dominance Right   Extremity/Trunk Assessment Upper Extremity Assessment Upper Extremity Assessment: Overall WFL for tasks assessed   Lower Extremity Assessment Lower Extremity Assessment: Defer to PT evaluation   Cervical / Trunk Assessment Cervical / Trunk Assessment: Kyphotic   Communication Communication Communication: No difficulties   Cognition Arousal/Alertness: Awake/alert Behavior During Therapy: WFL for tasks assessed/performed Overall Cognitive Status: Within Functional Limits for tasks assessed                                       General Comments  BP sitting edge of bed 105/53. Patient denied dizziness throughout session            [place] expects to be discharged to:: Private residence Living Arrangements: Spouse/significant other Available Help at Discharge: Family;Available 24 hours/day Type of Home: House Home Access: Stairs to enter [employer] of Steps: 5  Entrance Stairs-Rails: Left;Right;Can reach both Home Layout: Two level;Able to live on main level with bedroom/bathroom Alternate Level Stairs-Number of Steps: 13 Alternate Level Stairs-Rails: Right;Left;Can reach both Bathroom Shower/Tub: Occupational psychologist: Handicapped height Bathroom Accessibility: Yes   Home Equipment: Shower seat;Grab bars - tub/shower;Rolling Environmental consultant (2 wheels);Cane - single point          Prior Functioning/Environment Prior Level of Function : Independent/Modified Independent                        OT Problem List: Decreased activity tolerance         OT Goals(Current goals can be found in the care plan  section) Acute Rehab OT Goals Patient Stated Goal: Go home soon OT Goal Formulation: All assessment and education complete, DC therapy   AM-PAC OT "6 Clicks" Daily Activity     Outcome Measure Help from another person eating meals?: None Help from another person taking care of personal grooming?: None Help from another person toileting, which includes using toliet, bedpan, or urinal?: None Help from another person bathing (including washing, rinsing, drying)?: None Help from another person to put on and taking off regular upper body clothing?: None Help from another person to put on and taking off regular lower body clothing?: None 6 Click Score: 24   End of Session Nurse Communication: Mobility status;Other (comment) (sitting on toilet)  Activity Tolerance: Patient tolerated treatment well Patient left: Other (comment);with call bell/phone within reach (on toilet)  OT Visit Diagnosis: Other abnormalities of gait and mobility (R26.89)                Time: 1337-1410 OT Time Calculation (min): 33 min Charges:  OT General Charges $OT Visit: 1 Visit OT Evaluation $OT Eval Low Complexity: [place] OT OT pager: [place] 06/04/2021, 2:25 PM

## 2021-06-04 NOTE — Assessment & Plan Note (Signed)
In setting of generalized weakness with his fevers, achyness, etc CT head/neck without acute injury Negative plain films of L forearm

## 2021-06-04 NOTE — Progress Notes (Signed)
Pt refused, states he does not use cpap at home.

## 2021-06-04 NOTE — Evaluation (Signed)
Physical Therapy Evaluation Patient Details Name: Steven [name] MRN: [id number] DOB: [birth year] Today's Date: 06/04/2021  History of Present Illness  Patient is an 87 year old male admited with fatigue, body aches and recent fall due to weakness. Dx with dehydration and metabolic encephalopathy. PMH: SBO, PAD, TIA ICA, ankylosing spondylitis    Clinical Impression  Steven [name] is 87 y.o. male admitted with above HPI and diagnosis. Patient is currently limited by functional impairments below (see PT problem list). Patient lives with wife and is independent at baseline. Patient evaluated by Physical Therapy with no further acute PT needs identified. All education has been completed and the patient has no further questions. Patient is mobilizing at supervision/min guard level and no LOB noted. Pt noted to have low BP which is the greatest limiting factor for this pt with mobility. Recommend mobility team work with pt to progress ambulation activity as pt able and to closely monitor BP. See below for any follow-up Physical Therapy or equipment needs. PT is signing off. Thank you for this referral.        Recommendations for follow up therapy are one component of a multi-disciplinary discharge planning process, led by the attending physician.  Recommendations may be updated based on patient status, additional functional criteria and insurance authorization.  Follow Up Recommendations Outpatient PT (continue OPPT at [facility])    Assistance Recommended at Discharge Intermittent Supervision/Assistance  Patient can return home with the following       Equipment Recommendations None recommended by PT  Recommendations for Other Services       Functional Status Assessment Patient has had a recent decline in their functional status and demonstrates the ability to make significant improvements in function in a reasonable and predictable amount of time.     Precautions / Restrictions  Precautions Precautions: Fall Precaution Comments: watch BP; pt reports one fall on Tuesday, that is his only fall in last 6 months Restrictions Weight Bearing Restrictions: No      Mobility  Bed Mobility Overal bed mobility: Modified Independent             General bed mobility comments: HOB elevated    Transfers Overall transfer level: Needs assistance Equipment used: None Transfers: Sit to/from Stand Sit to Stand: Supervision           General transfer comment: supervision for safety, no assist needed    Ambulation/Gait Ambulation/Gait assistance: Min guard, Supervision Gait Distance (Feet): 40 Feet Assistive device: None, IV Pole Gait Pattern/deviations: Step-to pattern Gait velocity: decr     General Gait Details: short bouts of gait in room with pt having HHA close CGA assist to supervision with IV pole. no LOB noted. pt reported slight lightheaded sensation that resolved sitting.  Stairs            Wheelchair Mobility    Modified Rankin (Stroke Patients Only)       Balance Overall balance assessment: Mild deficits observed, not formally tested                                           Pertinent Vitals/Pain Pain Assessment Pain Assessment: No/denies pain    Home Living Family/patient expects to be discharged to:: Private residence Living Arrangements: Spouse/significant other Available Help at Discharge: Family;Available 24 hours/day Type of Home: House Home Access: Stairs to enter Entrance Stairs-Rails: Left;Right;Can reach both  Entrance Stairs-Number of Steps: 5 Alternate Level Stairs-Number of Steps: 13 Home Layout: Two level;Able to live on main level with bedroom/bathroom Home Equipment: Shower seat;Grab bars - tub/shower;Rolling Walker (2 wheels);Cane - single point      Prior Function Prior Level of Function : Independent/Modified Independent                     Hand Dominance   Dominant Hand:  Right    Extremity/Trunk Assessment   Upper Extremity Assessment Upper Extremity Assessment: Overall WFL for tasks assessed;Defer to OT evaluation    Lower Extremity Assessment Lower Extremity Assessment: Overall WFL for tasks assessed (23.26, 5xSit<>Stand, no UE use for power up from EOB)    Cervical / Trunk Assessment Cervical / Trunk Assessment: Normal (slight thoracic kyphosis)  Communication   Communication: No difficulties  Cognition Arousal/Alertness: Awake/alert Behavior During Therapy: WFL for tasks assessed/performed Overall Cognitive Status: Within Functional Limits for tasks assessed                                          General Comments General comments (skin integrity, edema, etc.): pt reported slight lightheadedness    Exercises     Assessment/Plan    PT Assessment All further PT needs can be met in the next venue of care  PT Problem List Decreased strength;Decreased activity tolerance;Decreased balance;Decreased mobility;Decreased knowledge of use of DME;Decreased knowledge of precautions       PT Treatment Interventions      PT Goals (Current goals can be found in the Care Plan section)  Acute Rehab PT Goals Patient Stated Goal: get back to OPPT to work on balance and go home PT Goal Formulation: All assessment and education complete, DC therapy Time For Goal Achievement: 06/05/21 Potential to Achieve Goals: Good    Frequency       Co-evaluation               AM-PAC PT "6 Clicks" Mobility  Outcome Measure Help needed turning from your back to your side while in a flat bed without using bedrails?: None Help needed moving from lying on your back to sitting on the side of a flat bed without using bedrails?: None Help needed moving to and from a bed to a chair (including a wheelchair)?: A Little Help needed standing up from a chair using your arms (e.g., wheelchair or bedside chair)?: A Little Help needed to walk in  hospital room?: A Little Help needed climbing 3-5 steps with a railing? : A Little 6 Click Score: 20    End of Session Equipment Utilized During Treatment: Gait belt Activity Tolerance: Patient tolerated treatment well Patient left: in bed;with call bell/phone within reach;with bed alarm set Nurse Communication: Mobility status PT Visit Diagnosis: Difficulty in walking, not elsewhere classified (R26.2);Unsteadiness on feet (R26.81);Other abnormalities of gait and mobility (R26.89)    Time: 9476-5465 PT Time Calculation (min) (ACUTE ONLY): 25 min   Charges:   PT Evaluation $PT Eval Low Complexity: 1 Low PT Treatments $Therapeutic Activity: 8-22 mins        Verner Mould, DPT Acute Rehabilitation Services Office [phone] Pager [phone]   Jacques Navy 06/04/2021, 3:14 PM

## 2021-06-04 NOTE — Assessment & Plan Note (Signed)
celexa 

## 2021-06-04 NOTE — Hospital Course (Addendum)
87 yo with hx carotid artery stenosis s/p endarterectomy on aspirin/plavix, anemia, TIA, SBO and multiple other medical issues who presented with concern for fever, concern for infection, generalized weakness, and fall.  He was treated with broad spectrum abx with concern for infection, but cultures have remained negative to this point.    He'll discharge off antibiotics with plan for outpatient follow up.  See below for additional details

## 2021-06-04 NOTE — Assessment & Plan Note (Signed)
Noted on imaging

## 2021-06-04 NOTE — Progress Notes (Signed)
Patient continues to deny and decline nocturnal CPAP use. Order changed to prn.

## 2021-06-04 NOTE — Assessment & Plan Note (Addendum)
Seems essentially resolved

## 2021-06-04 NOTE — Assessment & Plan Note (Addendum)
Continue aspirin/plavix/statin S/p endarterctomy

## 2021-06-04 NOTE — Assessment & Plan Note (Signed)
Replace and follow. ?

## 2021-06-04 NOTE — Progress Notes (Signed)
PROGRESS NOTE    Steven [name]  QAS:[id number] DOB: 12/11/1934 DOA: 06/03/2021 PCP: [name], MD  Chief Complaint  Patient presents with   Fall    Brief Narrative:  87 yo with hx carotid artery stenosis s/p endarterectomy on aspirin/plavix, anemia, TIA, SBO and multiple other medical issues who presented with concern for fever, concern for infection, generalized weakness, and fall.    See below for additional details    Assessment & Plan:   Principal Problem:   SIRS (systemic inflammatory response syndrome) (HCC) Active Problems:   Dehydration   Hypotension   Generalized weakness   Fall at home, initial encounter   Acute metabolic encephalopathy   Anemia   Carotid artery stenosis   Depression   Hypokalemia   Hyperlipidemia   Hypomagnesemia   Alcohol use   Rigors   Assessment and Plan: * SIRS (systemic inflammatory response syndrome) ([place])- (present on admission) Leukocytosis, tachypnea.  Occasionally soft BP.  Reported fevers at home.   CXR without acute cardiopulm abnormality, UA bland Negative covid, influenza Negative RVP He reports what sounds like rigors, will follow blood cultures and continue abx for now Will d/c vanc.  Continue cefepime for now.   Fall at home, initial encounter In setting of generalized weakness with his fevers, achyness, etc CT head/neck without acute injury Negative plain films of L forearm  Generalized weakness 2/2 suspected infection above  Hypotension Some soft BP, systolics in 79'G Continue to treat with abx IVF, bolus prn  Acute metabolic encephalopathy- (present on admission) Seems essentially resolved 2/2 suspected infection VBG without hypercarbia, ammonia wnl Delirium precautions Will continue to monitor  Anemia- (present on admission) Appears chronic, follow anemia labs  Carotid artery stenosis- (present on admission) Continue aspirin/plavix/statin S/p endarterctomy  Hypokalemia- (present on  admission) Replace and follow  Depression- (present on admission) celexa    DVT prophylaxis: scd Code Status: DNR Family Communication: none Disposition:   Status is: Observation The patient will require care spanning > 2 midnights and should be moved to inpatient because: concern for infection, on IV abx, awaiting cultures    Consultants:  none  Procedures:  none  Antimicrobials:  Anti-infectives (From admission, onward)    Start     Dose/Rate Route Frequency Ordered Stop   06/03/21 2200  ceFEPIme (MAXIPIME) 2 g in sodium chloride 0.9 % 100 mL IVPB        2 g 200 mL/hr over 30 Minutes Intravenous Every 8 hours 06/03/21 2117     06/03/21 2200  vancomycin (VANCOREADY) IVPB 1250 mg/250 mL  Status:  Discontinued        1,250 mg 166.7 mL/hr over 90 Minutes Intravenous Every 24 hours 06/03/21 2119 06/04/21 1720       Subjective: Feels stronger today, hasn't walked yet  Objective: Vitals:   06/04/21 1200 06/04/21 1220 06/04/21 1303 06/04/21 1727  BP: (!) 96/52 (!) 98/54 (!) 89/78 (!) 91/45  Pulse: 62 62 63 (!) 58  Resp: 20 17 18 18   Temp:   98.3 F (36.8 C) (!) 97.5 F (36.4 C)  TempSrc:   Oral Oral  SpO2: 100% 100% 99% 100%  Weight:   62.3 kg   Height:   5\' 8"  (1.727 m)     Intake/Output Summary (Last 24 hours) at 06/04/2021 1727 Last data filed at 06/04/2021 1500 Gross per 24 hour  Intake 1579.73 ml  Output --  Net 1579.73 ml   Filed Weights   06/04/21 1303  Weight: 62.3 kg  Examination:  General exam: Appears calm and comfortable  Respiratory system: Clear to auscultation. Respiratory effort normal. Cardiovascular system: RRR Gastrointestinal system: Abdomen is nondistended, soft and nontender.  Central nervous system: Alert and oriented. No focal neurological deficits. Extremities: no LEE Skin: No rashes, lesions or ulcers Psychiatry: Judgement and insight appear normal. Mood & affect appropriate.     Data Reviewed: I have personally  reviewed following labs and imaging studies  CBC: Recent Labs  Lab 06/03/21 1628 06/04/21 0630  WBC 11.4* 7.4  NEUTROABS 10.7* 6.7  HGB 11.4* 10.1*  HCT 35.1* 31.0*  MCV 100.3* 99.4  PLT 239 956    Basic Metabolic Panel: Recent Labs  Lab 06/03/21 1628 06/04/21 0630  NA 134* 130*  K 3.6 3.0*  CL 102 101  CO2 24 22  GLUCOSE 157* 128*  BUN 15 13  CREATININE 0.83 0.71  CALCIUM 8.5* 7.8*  MG 1.5* 2.2  PHOS 2.8 2.4*    GFR: Estimated Creatinine Clearance: 60.6 mL/min (by C-G formula based on SCr of 0.71 mg/dL).  Liver Function Tests: Recent Labs  Lab 06/03/21 1628 06/04/21 0630  AST 21   20 36  ALT 16   16 18   ALKPHOS 47   48 41  BILITOT 0.8   1.1 1.1  PROT 7.2   7.1 6.4*  ALBUMIN 3.9   3.8 3.3*    CBG: No results for input(s): GLUCAP in the last 168 hours.   Recent Results (from the past 240 hour(s))  Resp Panel by RT-PCR (Flu Yohannes Waibel&B, Covid) Nasopharyngeal Swab     Status: None   Collection Time: 06/03/21  4:28 PM   Specimen: Nasopharyngeal Swab; Nasopharyngeal(NP) swabs in vial transport medium  Result Value Ref Range Status   SARS Coronavirus 2 by RT PCR NEGATIVE NEGATIVE Final    Comment: (NOTE) SARS-CoV-2 target nucleic acids are NOT DETECTED.  The SARS-CoV-2 RNA is generally detectable in upper respiratory specimens during the acute phase of infection. The lowest concentration of SARS-CoV-2 viral copies this assay can detect is 138 copies/mL. Nayana Lenig negative result does not preclude SARS-Cov-2 infection and should not be used as the sole basis for treatment or other patient management decisions. Kordel Leavy negative result may occur with  improper specimen collection/handling, submission of specimen other than nasopharyngeal swab, presence of viral mutation(s) within the areas targeted by this assay, and inadequate number of viral copies(<138 copies/mL). Trevon Strothers negative result must be combined with clinical observations, patient history, and  epidemiological information. The expected result is Negative.  Fact Sheet for Patients:  EntrepreneurPulse.com.au  Fact Sheet for Healthcare Providers:  IncredibleEmployment.be  This test is no t yet approved or cleared by the [place] FDA and  has been authorized for detection and/or diagnosis of SARS-CoV-2 by FDA under an Emergency Use Authorization (EUA). This EUA will remain  in effect (meaning this test can be used) for the duration of the COVID-19 declaration under Section 564(b)(1) of the Act, 21 U.S.C.section 360bbb-3(b)(1), unless the authorization is terminated  or revoked sooner.       Influenza Kennesha Brewbaker by PCR NEGATIVE NEGATIVE Final   Influenza B by PCR NEGATIVE NEGATIVE Final    Comment: (NOTE) The Xpert Xpress SARS-CoV-2/FLU/RSV plus assay is intended as an aid in the diagnosis of influenza from Nasopharyngeal swab specimens and should not be used as Ridgely Anastacio sole basis for treatment. Nasal washings and aspirates are unacceptable for Xpert Xpress SARS-CoV-2/FLU/RSV testing.  Fact Sheet for Patients: EntrepreneurPulse.com.au  Fact Sheet for Healthcare Providers: IncredibleEmployment.be  This test is not yet approved or cleared by the [place] and has been authorized for detection and/or diagnosis of SARS-CoV-2 by FDA under an Emergency Use Authorization (EUA). This EUA will remain in effect (meaning this test can be used) for the duration of the COVID-19 declaration under Section 564(b)(1) of the Act, 21 U.S.C. section 360bbb-3(b)(1), unless the authorization is terminated or revoked.  Performed at [facility], [place] [address], [place], [place] [address]   Respiratory (~20 pathogens) panel by PCR     Status: None   Collection Time: 06/03/21  7:24 PM   Specimen: Nasopharyngeal Swab; Respiratory  Result Value Ref Range Status   Adenovirus NOT DETECTED NOT DETECTED  Final   Coronavirus 229E NOT DETECTED NOT DETECTED Final    Comment: (NOTE) The Coronavirus on the Respiratory Panel, DOES NOT test for the novel  Coronavirus (2019 nCoV)    Coronavirus HKU1 NOT DETECTED NOT DETECTED Final   Coronavirus NL63 NOT DETECTED NOT DETECTED Final   Coronavirus OC43 NOT DETECTED NOT DETECTED Final   Metapneumovirus NOT DETECTED NOT DETECTED Final   Rhinovirus / Enterovirus NOT DETECTED NOT DETECTED Final   Influenza Antavia Tandy NOT DETECTED NOT DETECTED Final   Influenza B NOT DETECTED NOT DETECTED Final   Parainfluenza Virus 1 NOT DETECTED NOT DETECTED Final   Parainfluenza Virus 2 NOT DETECTED NOT DETECTED Final   Parainfluenza Virus 3 NOT DETECTED NOT DETECTED Final   Parainfluenza Virus 4 NOT DETECTED NOT DETECTED Final   Respiratory Syncytial Virus NOT DETECTED NOT DETECTED Final   Bordetella pertussis NOT DETECTED NOT DETECTED Final   Bordetella Parapertussis NOT DETECTED NOT DETECTED Final   Chlamydophila pneumoniae NOT DETECTED NOT DETECTED Final   Mycoplasma pneumoniae NOT DETECTED NOT DETECTED Final    Comment: Performed at [facility], [place]. [address], [place], [place] [address]         Radiology Studies: DG Chest 2 View  Result Date: 06/03/2021 CLINICAL DATA:  Provided history: Fever.  Fall this morning. EXAM: CHEST - 2 VIEW COMPARISON:  Prior chest radiographs 12/17/2020 and earlier. FINDINGS: Heart size within normal limits. Aortic atherosclerosis. The patient's chin partially obscures the left lung apex on the AP radiograph. No appreciable airspace consolidation. No evidence of pleural effusion or pneumothorax. No acute bony abnormality identified. Multilevel ventral syndesmophytes within the thoracic spine suggesting ankylosing spondylitis. IMPRESSION: The patient's chin partially obscures the left lung apex on the AP radiograph. No evidence of acute cardiopulmonary abnormality. Aortic Atherosclerosis (ICD10-I70.0). Redemonstrated findings  suggestive of ankylosing spondylitis. Electronically Signed   By: Kellie Simmering D.O.   On: 06/03/2021 15:58   DG Forearm Left  Result Date: 06/03/2021 CLINICAL DATA:  Fever, fell EXAM: LEFT FOREARM - 2 VIEW COMPARISON:  None. FINDINGS: There is no evidence of fracture or other focal bone lesions. Soft tissues are unremarkable. IMPRESSION: Negative. Electronically Signed   By: Lucrezia Europe M.D.   On: 06/03/2021 16:17   CT HEAD WO CONTRAST (5MM)  Result Date: 06/03/2021 CLINICAL DATA:  Head trauma, moderate-severe EXAM: CT HEAD WITHOUT CONTRAST TECHNIQUE: Contiguous axial images were obtained from the base of the skull through the vertex without intravenous contrast. RADIATION DOSE REDUCTION: This exam was performed according to the departmental dose-optimization program which includes automated exposure control, adjustment of the mA and/or kV according to patient size and/or use of iterative reconstruction technique. COMPARISON:  03/27/2021 FINDINGS: Brain: No evidence of acute infarction, hemorrhage, hydrocephalus, extra-axial collection or mass lesion/mass effect.  Scattered low-density changes within the periventricular and subcortical white matter compatible with chronic microvascular ischemic change. Mild diffuse cerebral volume loss. Vascular: No hyperdense vessel or unexpected calcification. Skull: Normal. Negative for fracture or focal lesion. Sinuses/Orbits: No acute finding. Other: Negative for scalp hematoma. IMPRESSION: 1. No acute intracranial abnormality. 2. Chronic microvascular ischemic change and cerebral volume loss. Electronically Signed   By: Davina Poke D.O.   On: 06/03/2021 16:09   CT Cervical Spine Wo Contrast  Result Date: 06/03/2021 CLINICAL DATA:  Status post fall.  Neck pain. EXAM: CT CERVICAL SPINE WITHOUT CONTRAST TECHNIQUE: Multidetector CT imaging of the cervical spine was performed without intravenous contrast. Multiplanar CT image reconstructions were also generated.  RADIATION DOSE REDUCTION: This exam was performed according to the departmental dose-optimization program which includes automated exposure control, adjustment of the mA and/or kV according to patient size and/or use of iterative reconstruction technique. COMPARISON:  None. FINDINGS: Alignment: Anatomic alignment.  No static listhesis. Skull base and vertebrae: No acute fracture. No aggressive lytic or sclerotic osseous lesion. Old avulsion fracture along the posterior tip of the C7 spinous process. Soft tissues and spinal canal: Intraspinal soft tissues are not fully imaged on this examination due to poor soft tissue contrast, but there is no gross soft tissue abnormality. No prevertebral fluid or swelling. No visible canal hematoma. Disc levels: Ankylosis of the cervicothoracic spine vertebral bodies and posterior elements from the occiput put through T3. The spine inferior to T3 is not included in the field of view. Upper chest: Lung apices are clear. Other: No fluid collection or hematoma. IMPRESSION: 1.  No acute osseous injury of the cervical spine. 2. Ankylosis of the cervicothoracic spine vertebral bodies and posterior elements from the occiput put through T3 consistent with ankylosing spondylitis. Electronically Signed   By: Kathreen Devoid M.D.   On: 06/03/2021 16:11        Scheduled Meds:  aspirin  325 mg Oral Daily   citalopram  20 mg Oral Daily   clopidogrel  75 mg Oral Daily   rosuvastatin  20 mg Oral QHS   Continuous Infusions:  sodium chloride     ceFEPime (MAXIPIME) IV 2 g (06/04/21 1432)     LOS: 0 days    Time spent: over 30 min    Fayrene Helper, MD [facility]   To contact the attending provider between 7A-7P or the covering provider during after hours 7P-7A, please log into the web site [email or web address] and access using universal [place] password for that web site. If you do not have the password, please call the hospital operator.  06/04/2021, 5:27 PM

## 2021-06-04 NOTE — ED Notes (Signed)
ED TO INPATIENT HANDOFF REPORT  Name/Age/Gender Steven [name] 87 y.o. male  Code Status    Code Status Orders  (From admission, onward)           Start     Ordered   06/04/21 0012  Do not attempt resuscitation (DNR)  Continuous       Question Answer Comment  In the event of cardiac or respiratory ARREST Do not call a code blue   In the event of cardiac or respiratory ARREST Do not perform Intubation, CPR, defibrillation or ACLS   In the event of cardiac or respiratory ARREST Use medication by any route, position, wound care, and other measures to relive pain and suffering. May use oxygen, suction and manual treatment of airway obstruction as needed for comfort.      06/04/21 0011           Code Status History     Date Active Date Inactive Code Status Order ID Comments User Context   03/27/2021 2141 04/01/2021 2044 Full Code [id number]  Chotiner, Yevonne Aline, MD ED   02/29/2020 0144 03/02/2020 1754 Full Code [id number]  Elwyn Reach, MD Inpatient   11/02/2019 0130 11/05/2019 1913 Full Code [id number]  Mansy, Arvella Merles, MD ED   03/09/2019 0739 03/12/2019 2148 Full Code [id number]  Damita Lack, MD ED   11/06/2016 1539 11/08/2016 2018 Full Code [id number]  Leighton Parody, PA-C Inpatient   03/21/2015 1052 03/27/2015 1554 Full Code [id number]  Nat Christen, PA-C ED   04/10/2014 0647 04/12/2014 1747 Full Code [id number]  Rise Patience, MD Inpatient       Home/SNF/Other Home  Chief Complaint SIRS (systemic inflammatory response syndrome) ([place]) [R65.10]  Level of Care/Admitting Diagnosis ED Disposition     ED Disposition  Admit   Condition  --   Comment  Hospital Area: [facility] [[id number]]  Level of Care: Telemetry [5]  Admit to tele based on following criteria: Other see comments  Comments: sirs  May place patient in observation at [facility] or [facility] if equivalent level of care is available:: No  Covid Evaluation: Confirmed  COVID Negative  Diagnosis: SIRS (systemic inflammatory response syndrome) [facility]) [[id number]]  Admitting Physician: Toy Baker [[id number]]  Attending Physician: Toy Baker [[id number]]          Medical History Past Medical History:  Diagnosis Date   Allergy    enviromental   Anemia 2017   Anxiety    Arthritis    ankylosing spodilitis   Blood transfusion without reported diagnosis    during surgery   Cataract    Depression    Dyspnea    with exertion - had Echo done 09/29/16   History of kidney stones    Hyperlipidemia    Intestinal obstruction ([place])    Pneumonia    as a child    Allergies Allergies  Allergen Reactions   Other Hives   Indomethacin Hives and Other (See Comments)   Lactose Intolerance ([place])     GI UPSET    IV Location/Drains/Wounds Patient Lines/Drains/Airways Status     Active Line/Drains/Airways     Name Placement date Placement time Site Days   Peripheral IV 06/03/21 20 G Anterior;Right Forearm 06/03/21  1710  Forearm  1   Incision (Closed) 03/31/21 Groin Right 03/31/21  1148  -- 65   Incision (Closed) 03/31/21 Neck Left 03/31/21  1148  -- 65  Labs/Imaging Results for orders placed or performed during the hospital encounter of 06/03/21 (from the past 48 hour(s))  TSH     Status: None   Collection Time: 06/03/21  4:27 PM  Result Value Ref Range   TSH 1.417 0.350 - 4.500 uIU/mL    Comment: Performed by a 3rd Generation assay with a functional sensitivity of <=0.01 uIU/mL. Performed at [facility], [place] [address] [place], [place] [address]   C-reactive protein     Status: Abnormal   Collection Time: 06/03/21  4:27 PM  Result Value Ref Range   CRP 7.9 (H) <1.0 mg/dL    Comment: Performed at [place] [address], [place], [place] [address]  CBC with Differential     Status: Abnormal   Collection Time: 06/03/21  4:28 PM  Result Value Ref Range   WBC 11.4 (H) 4.0 - 10.5 K/uL   RBC 3.50  (L) 4.22 - 5.81 MIL/uL   Hemoglobin 11.4 (L) 13.0 - 17.0 g/dL   HCT 35.1 (L) 39.0 - 52.0 %   MCV 100.3 (H) 80.0 - 100.0 fL   MCH 32.6 26.0 - 34.0 pg   MCHC 32.5 30.0 - 36.0 g/dL   RDW 14.6 11.5 - 15.5 %   Platelets 239 150 - 400 K/uL   nRBC 0.0 0.0 - 0.2 %   Neutrophils Relative % 94 %   Neutro Abs 10.7 (H) 1.7 - 7.7 K/uL   Lymphocytes Relative 3 %   Lymphs Abs 0.4 (L) 0.7 - 4.0 K/uL   Monocytes Relative 3 %   Monocytes Absolute 0.3 0.1 - 1.0 K/uL   Eosinophils Relative 0 %   Eosinophils Absolute 0.0 0.0 - 0.5 K/uL   Basophils Relative 0 %   Basophils Absolute 0.0 0.0 - 0.1 K/uL   Immature Granulocytes 0 %   Abs Immature Granulocytes 0.05 0.00 - 0.07 K/uL    Comment: Performed at [facility], [place] [address] [place], [place] [address]  Comprehensive metabolic panel     Status: Abnormal   Collection Time: 06/03/21  4:28 PM  Result Value Ref Range   Sodium 134 (L) 135 - 145 mmol/L   Potassium 3.6 3.5 - 5.1 mmol/L   Chloride 102 98 - 111 mmol/L   CO2 24 22 - 32 mmol/L   Glucose, Bld 157 (H) 70 - 99 mg/dL    Comment: Glucose reference range applies only to samples taken after fasting for at least 8 hours.   BUN 15 8 - 23 mg/dL   Creatinine, Ser 0.83 0.61 - 1.24 mg/dL   Calcium 8.5 (L) 8.9 - 10.3 mg/dL   Total Protein 7.1 6.5 - 8.1 g/dL   Albumin 3.8 3.5 - 5.0 g/dL   AST 20 15 - 41 U/L   ALT 16 0 - 44 U/L   Alkaline Phosphatase 48 38 - 126 U/L   Total Bilirubin 1.1 0.3 - 1.2 mg/dL   GFR, Estimated >60 >60 mL/min    Comment: (NOTE) Calculated using the CKD-EPI Creatinine Equation (2021)    Anion gap 8 5 - 15    Comment: Performed at [facility], [place] [address] [place], [place] [address]  Urinalysis, Routine w reflex microscopic     Status: None   Collection Time: 06/03/21  4:28 PM  Result Value Ref Range   Color, Urine YELLOW YELLOW   APPearance CLEAR CLEAR   Specific Gravity, Urine 1.016 1.005 - 1.030   pH 5.0  5.0 - 8.0    Glucose, UA NEGATIVE NEGATIVE mg/dL   Hgb urine dipstick NEGATIVE NEGATIVE   Bilirubin Urine NEGATIVE NEGATIVE   Ketones, ur NEGATIVE NEGATIVE mg/dL   Protein, ur NEGATIVE NEGATIVE mg/dL   Nitrite NEGATIVE NEGATIVE   Leukocytes,Ua NEGATIVE NEGATIVE    Comment: Performed at [place] [address] [place], [place] [address]  Resp Panel by RT-PCR (Flu A&B, Covid) Nasopharyngeal Swab     Status: None   Collection Time: 06/03/21  4:28 PM   Specimen: Nasopharyngeal Swab; Nasopharyngeal(NP) swabs in vial transport medium  Result Value Ref Range   SARS Coronavirus 2 by RT PCR NEGATIVE NEGATIVE    Comment: (NOTE) SARS-CoV-2 target nucleic acids are NOT DETECTED.  The SARS-CoV-2 RNA is generally detectable in upper respiratory specimens during the acute phase of infection. The lowest concentration of SARS-CoV-2 viral copies this assay can detect is 138 copies/mL. A negative result does not preclude SARS-Cov-2 infection and should not be used as the sole basis for treatment or other patient management decisions. A negative result may occur with  improper specimen collection/handling, submission of specimen other than nasopharyngeal swab, presence of viral mutation(s) within the areas targeted by this assay, and inadequate number of viral copies(<138 copies/mL). A negative result must be combined with clinical observations, patient history, and epidemiological information. The expected result is Negative.  Fact Sheet for Patients:  EntrepreneurPulse.com.au  Fact Sheet for Healthcare Providers:  IncredibleEmployment.be  This test is no t yet approved or cleared by the [place] FDA and  has been authorized for detection and/or diagnosis of SARS-CoV-2 by FDA under an Emergency Use Authorization (EUA). This EUA will remain  in effect (meaning this test can be used) for the duration of the COVID-19 declaration under Section  564(b)(1) of the Act, 21 U.S.C.section 360bbb-3(b)(1), unless the authorization is terminated  or revoked sooner.       Influenza A by PCR NEGATIVE NEGATIVE   Influenza B by PCR NEGATIVE NEGATIVE    Comment: (NOTE) The Xpert Xpress SARS-CoV-2/FLU/RSV plus assay is intended as an aid in the diagnosis of influenza from Nasopharyngeal swab specimens and should not be used as a sole basis for treatment. Nasal washings and aspirates are unacceptable for Xpert Xpress SARS-CoV-2/FLU/RSV testing.  Fact Sheet for Patients: EntrepreneurPulse.com.au  Fact Sheet for Healthcare Providers: IncredibleEmployment.be  This test is not yet approved or cleared by the [place] FDA and has been authorized for detection and/or diagnosis of SARS-CoV-2 by FDA under an Emergency Use Authorization (EUA). This EUA will remain in effect (meaning this test can be used) for the duration of the COVID-19 declaration under Section 564(b)(1) of the Act, 21 U.S.C. section 360bbb-3(b)(1), unless the authorization is terminated or revoked.  Performed at [facility], [place] [address], [place], [place] [address]   Protime-INR     Status: None   Collection Time: 06/03/21  4:28 PM  Result Value Ref Range   Prothrombin Time 14.8 11.4 - 15.2 seconds   INR 1.2 0.8 - 1.2    Comment: (NOTE) INR goal varies based on device and disease states. Performed at [facility], [place] [address] [place], [place] [address]   CK     Status: None   Collection Time: 06/03/21  4:28 PM  Result Value Ref Range   Total CK 97 49 - 397 U/L    Comment: Performed at [facility], [place] [address], [place], [place] [address]  Hepatic function panel     Status: None   Collection Time: 06/03/21  4:28 PM  Result Value Ref Range   Total Protein 7.2 6.5 - 8.1 g/dL   Albumin 3.9 3.5 - 5.0 g/dL   AST 21 15 - 41 U/L   ALT 16 0 - 44 U/L   Alkaline  Phosphatase 47 38 - 126 U/L   Total Bilirubin 0.8 0.3 - 1.2 mg/dL   Bilirubin, Direct 0.2 0.0 - 0.2 mg/dL   Indirect Bilirubin 0.6 0.3 - 0.9 mg/dL    Comment: Performed at [facility], [place] [address] [place], [place] [address]  Magnesium     Status: Abnormal   Collection Time: 06/03/21  4:28 PM  Result Value Ref Range   Magnesium 1.5 (L) 1.7 - 2.4 mg/dL    Comment: Performed at [facility], [place] [address], [place], [place] [address]  Phosphorus     Status: None   Collection Time: 06/03/21  4:28 PM  Result Value Ref Range   Phosphorus 2.8 2.5 - 4.6 mg/dL    Comment: Performed at [facility], [place] [address], [place], [place] [address]  Sedimentation rate     Status: Abnormal   Collection Time: 06/03/21  4:28 PM  Result Value Ref Range   Sed Rate 30 (H) 0 - 16 mm/hr    Comment: Performed at [facility], [place] [address], [place], [place] [address]  Lactic acid, plasma     Status: None   Collection Time: 06/03/21  6:59 PM  Result Value Ref Range   Lactic Acid, Venous 1.9 0.5 - 1.9 mmol/L    Comment: Performed at [facility], [place] [address], [place], [place] [address]  Procalcitonin - Baseline     Status: None   Collection Time: 06/03/21  6:59 PM  Result Value Ref Range   Procalcitonin 0.17 ng/mL    Comment:        Interpretation: PCT (Procalcitonin) <= 0.5 ng/mL: Systemic infection (sepsis) is not likely. Local bacterial infection is possible. (NOTE)       Sepsis PCT Algorithm           Lower Respiratory Tract                                      Infection PCT Algorithm    ----------------------------     ----------------------------         PCT < 0.25 ng/mL                PCT < 0.10 ng/mL          Strongly encourage             Strongly discourage   discontinuation of antibiotics    initiation of antibiotics    ----------------------------     -----------------------------        PCT 0.25 - 0.50 ng/mL            PCT 0.10 - 0.25 ng/mL               OR       >80% decrease in PCT            Discourage initiation of  antibiotics      Encourage discontinuation           of antibiotics    ----------------------------     -----------------------------         PCT >= 0.50 ng/mL              PCT 0.26 - 0.50 ng/mL               AND        <80% decrease in PCT             Encourage initiation of                                             antibiotics       Encourage continuation           of antibiotics    ----------------------------     -----------------------------        PCT >= 0.50 ng/mL                  PCT > 0.50 ng/mL               AND         increase in PCT                  Strongly encourage                                      initiation of antibiotics    Strongly encourage escalation           of antibiotics                                     -----------------------------                                           PCT <= 0.25 ng/mL                                                 OR                                        > 80% decrease in PCT                                      Discontinue / Do not initiate                                             antibiotics  Performed at [place] [address] [place], [place] [address]   Respiratory (~20 pathogens) panel by PCR     Status: None  Collection Time: 06/03/21  7:24 PM   Specimen: Nasopharyngeal Swab; Respiratory  Result Value Ref Range   Adenovirus NOT DETECTED NOT DETECTED   Coronavirus 229E NOT DETECTED NOT DETECTED    Comment: (NOTE) The Coronavirus on the Respiratory Panel, DOES NOT test for the novel  Coronavirus (2019 nCoV)    Coronavirus HKU1 NOT DETECTED NOT DETECTED   Coronavirus NL63 NOT DETECTED NOT DETECTED   Coronavirus OC43 NOT DETECTED NOT DETECTED   Metapneumovirus NOT DETECTED NOT DETECTED   Rhinovirus /  Enterovirus NOT DETECTED NOT DETECTED   Influenza A NOT DETECTED NOT DETECTED   Influenza B NOT DETECTED NOT DETECTED   Parainfluenza Virus 1 NOT DETECTED NOT DETECTED   Parainfluenza Virus 2 NOT DETECTED NOT DETECTED   Parainfluenza Virus 3 NOT DETECTED NOT DETECTED   Parainfluenza Virus 4 NOT DETECTED NOT DETECTED   Respiratory Syncytial Virus NOT DETECTED NOT DETECTED   Bordetella pertussis NOT DETECTED NOT DETECTED   Bordetella Parapertussis NOT DETECTED NOT DETECTED   Chlamydophila pneumoniae NOT DETECTED NOT DETECTED   Mycoplasma pneumoniae NOT DETECTED NOT DETECTED    Comment: Performed at [facility], [place] [address], [place], [place] [address]  Ammonia     Status: None   Collection Time: 06/04/21  1:09 AM  Result Value Ref Range   Ammonia 12 9 - 35 umol/L    Comment: Performed at [facility], [place] [address] [place], [place] [address]  Blood gas, venous     Status: Abnormal   Collection Time: 06/04/21  1:09 AM  Result Value Ref Range   pH, Ven 7.44 (H) 7.25 - 7.43   pCO2, Ven 35 (L) 44 - 60 mmHg   pO2, Ven 68 (H) 32 - 45 mmHg   Bicarbonate 23.8 20.0 - 28.0 mmol/L   Acid-Base Excess 0.1 0.0 - 2.0 mmol/L   O2 Saturation 95.5 %   Patient temperature 37.0    Drawn by DRAWN BY RN     Comment: Performed at [facility], [place] [address], [place], [place] [address]  Magnesium     Status: None   Collection Time: 06/04/21  6:30 AM  Result Value Ref Range   Magnesium 2.2 1.7 - 2.4 mg/dL    Comment: Performed at [facility], [place] [address] [place], [place] [address]  Phosphorus     Status: Abnormal   Collection Time: 06/04/21  6:30 AM  Result Value Ref Range   Phosphorus 2.4 (L) 2.5 - 4.6 mg/dL    Comment: Performed at [facility], [place] [address], [place],  [address]  CBC WITH DIFFERENTIAL     Status: Abnormal   Collection Time: 06/04/21  6:30 AM  Result Value Ref Range   WBC 7.4 4.0 -  10.5 K/uL   RBC 3.12 (L) 4.22 - 5.81 MIL/uL   Hemoglobin 10.1 (L) 13.0 - 17.0 g/dL   HCT 31.0 (L) 39.0 - 52.0 %   MCV 99.4 80.0 - 100.0 fL   MCH 32.4 26.0 - 34.0 pg   MCHC 32.6 30.0 - 36.0 g/dL   RDW 14.6 11.5 - 15.5 %   Platelets 179 150 - 400 K/uL   nRBC 0.0 0.0 - 0.2 %   Neutrophils Relative % 91 %   Neutro Abs 6.7 1.7 - 7.7 K/uL   Lymphocytes Relative 5 %   Lymphs Abs 0.4 (L) 0.7 - 4.0 K/uL   Monocytes Relative 4 %   Monocytes Absolute 0.3  0.1 - 1.0 K/uL   Eosinophils Relative 0 %   Eosinophils Absolute 0.0 0.0 - 0.5 K/uL   Basophils Relative 0 %   Basophils Absolute 0.0 0.0 - 0.1 K/uL   Immature Granulocytes 0 %   Abs Immature Granulocytes 0.03 0.00 - 0.07 K/uL    Comment: Performed at [facility], [place] [address] [place], [place] [address]  Comprehensive metabolic panel     Status: Abnormal   Collection Time: 06/04/21  6:30 AM  Result Value Ref Range   Sodium 130 (L) 135 - 145 mmol/L   Potassium 3.0 (L) 3.5 - 5.1 mmol/L   Chloride 101 98 - 111 mmol/L   CO2 22 22 - 32 mmol/L   Glucose, Bld 128 (H) 70 - 99 mg/dL    Comment: Glucose reference range applies only to samples taken after fasting for at least 8 hours.   BUN 13 8 - 23 mg/dL   Creatinine, Ser 0.71 0.61 - 1.24 mg/dL   Calcium 7.8 (L) 8.9 - 10.3 mg/dL   Total Protein 6.4 (L) 6.5 - 8.1 g/dL   Albumin 3.3 (L) 3.5 - 5.0 g/dL   AST 36 15 - 41 U/L   ALT 18 0 - 44 U/L   Alkaline Phosphatase 41 38 - 126 U/L   Total Bilirubin 1.1 0.3 - 1.2 mg/dL   GFR, Estimated >60 >60 mL/min    Comment: (NOTE) Calculated using the CKD-EPI Creatinine Equation (2021)    Anion gap 7 5 - 15    Comment: Performed at [facility], [place] [address] [place], [place] [address]   DG Chest 2 View  Result Date: 06/03/2021 CLINICAL DATA:  Provided history: Fever.  Fall this morning. EXAM: CHEST - 2 VIEW COMPARISON:  Prior chest radiographs 12/17/2020 and earlier. FINDINGS: Heart size within normal  limits. Aortic atherosclerosis. The patient's chin partially obscures the left lung apex on the AP radiograph. No appreciable airspace consolidation. No evidence of pleural effusion or pneumothorax. No acute bony abnormality identified. Multilevel ventral syndesmophytes within the thoracic spine suggesting ankylosing spondylitis. IMPRESSION: The patient's chin partially obscures the left lung apex on the AP radiograph. No evidence of acute cardiopulmonary abnormality. Aortic Atherosclerosis (ICD10-I70.0). Redemonstrated findings suggestive of ankylosing spondylitis. Electronically Signed   By: Kellie Simmering D.O.   On: 06/03/2021 15:58   DG Forearm Left  Result Date: 06/03/2021 CLINICAL DATA:  Fever, fell EXAM: LEFT FOREARM - 2 VIEW COMPARISON:  None. FINDINGS: There is no evidence of fracture or other focal bone lesions. Soft tissues are unremarkable. IMPRESSION: Negative. Electronically Signed   By: Lucrezia Europe M.D.   On: 06/03/2021 16:17   CT HEAD WO CONTRAST (5MM)  Result Date: 06/03/2021 CLINICAL DATA:  Head trauma, moderate-severe EXAM: CT HEAD WITHOUT CONTRAST TECHNIQUE: Contiguous axial images were obtained from the base of the skull through the vertex without intravenous contrast. RADIATION DOSE REDUCTION: This exam was performed according to the departmental dose-optimization program which includes automated exposure control, adjustment of the mA and/or kV according to patient size and/or use of iterative reconstruction technique. COMPARISON:  03/27/2021 FINDINGS: Brain: No evidence of acute infarction, hemorrhage, hydrocephalus, extra-axial collection or mass lesion/mass effect. Scattered low-density changes within the periventricular and subcortical white matter compatible with chronic microvascular ischemic change. Mild diffuse cerebral volume loss. Vascular: No hyperdense vessel or unexpected calcification. Skull: Normal. Negative for fracture or focal lesion. Sinuses/Orbits: No acute finding.  Other: Negative for scalp hematoma. IMPRESSION: 1. No acute intracranial abnormality.  2. Chronic microvascular ischemic change and cerebral volume loss. Electronically Signed   By: Davina Poke D.O.   On: 06/03/2021 16:09   CT Cervical Spine Wo Contrast  Result Date: 06/03/2021 CLINICAL DATA:  Status post fall.  Neck pain. EXAM: CT CERVICAL SPINE WITHOUT CONTRAST TECHNIQUE: Multidetector CT imaging of the cervical spine was performed without intravenous contrast. Multiplanar CT image reconstructions were also generated. RADIATION DOSE REDUCTION: This exam was performed according to the departmental dose-optimization program which includes automated exposure control, adjustment of the mA and/or kV according to patient size and/or use of iterative reconstruction technique. COMPARISON:  None. FINDINGS: Alignment: Anatomic alignment.  No static listhesis. Skull base and vertebrae: No acute fracture. No aggressive lytic or sclerotic osseous lesion. Old avulsion fracture along the posterior tip of the C7 spinous process. Soft tissues and spinal canal: Intraspinal soft tissues are not fully imaged on this examination due to poor soft tissue contrast, but there is no gross soft tissue abnormality. No prevertebral fluid or swelling. No visible canal hematoma. Disc levels: Ankylosis of the cervicothoracic spine vertebral bodies and posterior elements from the occiput put through T3. The spine inferior to T3 is not included in the field of view. Upper chest: Lung apices are clear. Other: No fluid collection or hematoma. IMPRESSION: 1.  No acute osseous injury of the cervical spine. 2. Ankylosis of the cervicothoracic spine vertebral bodies and posterior elements from the occiput put through T3 consistent with ankylosing spondylitis. Electronically Signed   By: Kathreen Devoid M.D.   On: 06/03/2021 16:11    Pending Labs Unresulted Labs (From admission, onward)     Start     Ordered   06/04/21 0500  Procalcitonin   Daily,   R      06/03/21 1859   06/03/21 2104  Culture, blood (x 2)  BLOOD CULTURE X 2,   STAT     Comments: INITIATE ANTIBIOTICS WITHIN 1 HOUR AFTER BLOOD CULTURES DRAWN.  If unable to obtain blood cultures, call MD immediately regarding antibiotic instructions.    06/03/21 2104   06/03/21 1917  Blood culture (routine x 2)  BLOOD CULTURE X 2,   STAT,   Status:  Canceled      06/03/21 1916            Vitals/Pain Today's Vitals   06/04/21 0909 06/04/21 0930 06/04/21 1000 06/04/21 1030  BP: (!) 113/56 (!) 101/56 (!) 103/51 (!) 102/51  Pulse: 65 63 65 64  Resp: 16 12 (!) 22 (!) 23  Temp:      TempSrc:      SpO2: 97% 97% 96% 97%  PainSc:        Isolation Precautions No active isolations  Medications Medications  ceFEPIme (MAXIPIME) 2 g in sodium chloride 0.9 % 100 mL IVPB (2 g Intravenous New Bag/Given 06/04/21 0605)  vancomycin (VANCOREADY) IVPB 1250 mg/250 mL (0 mg Intravenous Stopped 06/04/21 0010)  aspirin EC tablet 325 mg (325 mg Oral Given 06/04/21 0943)  citalopram (CELEXA) tablet 20 mg (20 mg Oral Given 06/04/21 0942)  clopidogrel (PLAVIX) tablet 75 mg (75 mg Oral Given 06/04/21 0943)  ipratropium (ATROVENT) 0.06 % nasal spray 2 spray (has no administration in time range)  rosuvastatin (CRESTOR) tablet 20 mg (20 mg Oral Given 06/04/21 0020)  acetaminophen (TYLENOL) tablet 650 mg (has no administration in time range)    Or  acetaminophen (TYLENOL) suppository 650 mg (has no administration in time range)  HYDROcodone-acetaminophen (NORCO/VICODIN) 5-325 MG per tablet 1-2  tablet (has no administration in time range)  0.9 %  sodium chloride infusion (has no administration in time range)  potassium chloride SA (KLOR-CON M) CR tablet 40 mEq (40 mEq Oral Given 06/04/21 0817)  sodium chloride 0.9 % bolus 500 mL (0 mLs Intravenous Stopped 06/03/21 1847)  magnesium sulfate IVPB 2 g 50 mL (0 g Intravenous Stopped 06/04/21 0103)    Mobility walks

## 2021-06-05 DIAGNOSIS — R748 Abnormal levels of other serum enzymes: Secondary | ICD-10-CM

## 2021-06-05 DIAGNOSIS — R197 Diarrhea, unspecified: Secondary | ICD-10-CM

## 2021-06-05 LAB — COMPREHENSIVE METABOLIC PANEL
ALT: 21 U/L (ref 0–44)
AST: 38 U/L (ref 15–41)
Albumin: 3.5 g/dL (ref 3.5–5.0)
Alkaline Phosphatase: 42 U/L (ref 38–126)
Anion gap: 3 — ABNORMAL LOW (ref 5–15)
BUN: 12 mg/dL (ref 8–23)
CO2: 22 mmol/L (ref 22–32)
Calcium: 8.1 mg/dL — ABNORMAL LOW (ref 8.9–10.3)
Chloride: 109 mmol/L (ref 98–111)
Creatinine, Ser: 0.82 mg/dL (ref 0.61–1.24)
GFR, Estimated: >60 mL/min (ref >60)
Glucose, Bld: 129 mg/dL — ABNORMAL HIGH (ref 70–99)
Potassium: 3.7 mmol/L (ref 3.5–5.1)
Sodium: 134 mmol/L — ABNORMAL LOW (ref 135–145)
Total Bilirubin: 0.4 mg/dL (ref 0.3–1.2)
Total Protein: 6.9 g/dL (ref 6.5–8.1)

## 2021-06-05 LAB — CBC WITH DIFFERENTIAL/PLATELET
Abs Immature Granulocytes: 0.02 10*3/uL (ref 0.00–0.07)
Basophils Absolute: 0 10*3/uL (ref 0.0–0.1)
Basophils Relative: 1 %
Eosinophils Absolute: 0.2 10*3/uL (ref 0.0–0.5)
Eosinophils Relative: 5 %
HCT: 32.7 % — ABNORMAL LOW (ref 39.0–52.0)
Hemoglobin: 10.4 g/dL — ABNORMAL LOW (ref 13.0–17.0)
Immature Granulocytes: 0 %
Lymphocytes Relative: 13 %
Lymphs Abs: 0.7 10*3/uL (ref 0.7–4.0)
MCH: 32.3 pg (ref 26.0–34.0)
MCHC: 31.8 g/dL (ref 30.0–36.0)
MCV: 101.6 fL — ABNORMAL HIGH (ref 80.0–100.0)
Monocytes Absolute: 0.5 10*3/uL (ref 0.1–1.0)
Monocytes Relative: 9 %
Neutro Abs: 3.7 10*3/uL (ref 1.7–7.7)
Neutrophils Relative %: 72 %
Platelets: 194 10*3/uL (ref 150–400)
RBC: 3.22 MIL/uL — ABNORMAL LOW (ref 4.22–5.81)
RDW: 14.3 % (ref 11.5–15.5)
WBC: 5.2 10*3/uL (ref 4.0–10.5)
nRBC: 0 % (ref 0.0–0.2)

## 2021-06-05 LAB — C DIFFICILE QUICK SCREEN W PCR REFLEX
C Diff antigen: NEGATIVE
C Diff interpretation: NOT DETECTED
C Diff toxin: NEGATIVE

## 2021-06-05 LAB — MAGNESIUM: Magnesium: 2.1 mg/dL (ref 1.7–2.4)

## 2021-06-05 LAB — PROCALCITONIN: Procalcitonin: 0.11 ng/mL

## 2021-06-05 LAB — PHOSPHORUS: Phosphorus: 1.5 mg/dL — ABNORMAL LOW (ref 2.5–4.6)

## 2021-06-05 LAB — CK: Total CK: 935 U/L — ABNORMAL HIGH (ref 49–397)

## 2021-06-05 MED ORDER — SODIUM CHLORIDE 0.9 % IV SOLN
2.0000 g | INTRAVENOUS | Status: DC
  Administered 2021-06-05: 2 g via INTRAVENOUS
  Filled 2021-06-05 (×2): qty 20

## 2021-06-05 MED ORDER — LOPERAMIDE HCL 2 MG PO CAPS
2.0000 mg | ORAL_CAPSULE | Freq: Once | ORAL | Status: AC
  Administered 2021-06-05: 2 mg via ORAL
  Filled 2021-06-05: qty 1

## 2021-06-05 MED ORDER — SODIUM PHOSPHATES 45 MMOLE/15ML IV SOLN
30.0000 mmol | Freq: Once | INTRAVENOUS | Status: AC
  Administered 2021-06-05: 30 mmol via INTRAVENOUS
  Filled 2021-06-05: qty 10

## 2021-06-05 NOTE — Progress Notes (Signed)
PROGRESS NOTE    Steven [name]  URK:[id number] DOB: Feb 10, 1935 DOA: 06/03/2021 PCP: [name], MD  Chief Complaint  Patient presents with   Fall    Brief Narrative:  87 yo with hx carotid artery stenosis s/p endarterectomy on aspirin/plavix, anemia, TIA, SBO and multiple other medical issues who presented with concern for fever, concern for infection, generalized weakness, and fall.    See below for additional details    Assessment & Plan:   Principal Problem:   SIRS (systemic inflammatory response syndrome) (HCC) Active Problems:   Dehydration   Hypotension   Generalized weakness   Fall at home, initial encounter   Acute metabolic encephalopathy   Anemia   Elevated CK   Carotid artery stenosis   Diarrhea   Depression   Hypokalemia   Ankylosing spondylitis of lumbosacral region ([place])   Hyperlipidemia   Hypomagnesemia   Alcohol use   Rigors   Fall   Assessment and Plan: * SIRS (systemic inflammatory response syndrome) ([place])- (present on admission) Leukocytosis, tachypnea.  Occasionally soft BP.  Reported fevers at home.   CXR without acute cardiopulm abnormality, UA bland Negative covid, influenza Negative RVP He reports what sounds like rigors, will follow blood cultures and continue abx for now Will d/c vanc.  Continue ceftriaxone for now. Will wait for 48 hr blood cx negative with hx rigors and immunosuppressed on methotrexate  Fall at home, initial encounter In setting of generalized weakness with his fevers, achyness, etc CT head/neck without acute injury Negative plain films of L forearm  Generalized weakness 2/2 suspected infection above  Hypotension Continue on lower side Continue IVF He's asymptomatic  Acute metabolic encephalopathy- (present on admission) Seems essentially resolved 2/2 suspected infection VBG without hypercarbia, ammonia wnl Delirium precautions Will continue to monitor  Elevated CK trend  Anemia- (present on  admission) Appears chronic, follow anemia labs  Diarrhea Negative c diff Follow, monitor  Carotid artery stenosis- (present on admission) Continue aspirin/plavix/statin S/p endarterctomy  Hypokalemia- (present on admission) Replace and follow  Depression- (present on admission) celexa  Ankylosing spondylitis of lumbosacral region [facility])- (present on admission) Noted on imaging    DVT prophylaxis: scd Code Status: DNR Family Communication: none Disposition:   Status is: Observation The patient will require care spanning > 2 midnights and should be moved to inpatient because: concern for infection, on IV abx, awaiting cultures    Consultants:  none  Procedures:  none  Antimicrobials:  Anti-infectives (From admission, onward)    Start     Dose/Rate Route Frequency Ordered Stop   06/05/21 1600  cefTRIAXone (ROCEPHIN) 2 g in sodium chloride 0.9 % 100 mL IVPB        2 g 200 mL/hr over 30 Minutes Intravenous Every 24 hours 06/05/21 0733     06/03/21 2200  ceFEPIme (MAXIPIME) 2 g in sodium chloride 0.9 % 100 mL IVPB  Status:  Discontinued        2 g 200 mL/hr over 30 Minutes Intravenous Every 8 hours 06/03/21 2117 06/05/21 0733   06/03/21 2200  vancomycin (VANCOREADY) IVPB 1250 mg/250 mL  Status:  Discontinued        1,250 mg 166.7 mL/hr over 90 Minutes Intravenous Every 24 hours 06/03/21 2119 06/04/21 1720       Subjective: No new complaints Feeling better  Objective: Vitals:   06/05/21 0130 06/05/21 0507 06/05/21 1000 06/05/21 1333  BP: (!) 96/48 (!) 94/51 113/70 100/62  Pulse: (!) 55 (!) 53 60 (!) 56  Resp: 16 16  18   Temp: 97.7 F (36.5 C) 97.7 F (36.5 C) 97.7 F (36.5 C) 98.1 F (36.7 C)  TempSrc: Oral Oral Oral Oral  SpO2: 98% 99% 99% 96%  Weight:      Height:        Intake/Output Summary (Last 24 hours) at 06/05/2021 2004 Last data filed at 06/05/2021 1756 Gross per 24 hour  Intake 1529.11 ml  Output 400 ml  Net 1129.11 ml   Filed Weights    06/04/21 1303  Weight: 62.3 kg    Examination:  General: No acute distress. Cardiovascular: RRR Lungs:unlabored Abdomen: Soft, nontender, nondistended  Neurological: Alert and oriented 3. Moves all extremities 4. Cranial nerves II through XII grossly intact. Skin: Warm and dry. No rashes or lesions. Extremities: No clubbing or cyanosis. No edema.   Data Reviewed: I have personally reviewed following labs and imaging studies  CBC: Recent Labs  Lab 06/03/21 1628 06/04/21 0630 06/05/21 0554  WBC 11.4* 7.4 5.2  NEUTROABS 10.7* 6.7 3.7  HGB 11.4* 10.1* 10.4*  HCT 35.1* 31.0* 32.7*  MCV 100.3* 99.4 101.6*  PLT 239 179 073    Basic Metabolic Panel: Recent Labs  Lab 06/03/21 1628 06/04/21 0630 06/05/21 0554  NA 134* 130* 134*  K 3.6 3.0* 3.7  CL 102 101 109  CO2 24 22 22   GLUCOSE 157* 128* 129*  BUN 15 13 12   CREATININE 0.83 0.71 0.82  CALCIUM 8.5* 7.8* 8.1*  MG 1.5* 2.2 2.1  PHOS 2.8 2.4* 1.5*    GFR: Estimated Creatinine Clearance: 59.1 mL/min (by C-G formula based on SCr of 0.82 mg/dL).  Liver Function Tests: Recent Labs  Lab 06/03/21 1628 06/04/21 0630 06/05/21 0554  AST 21   20 36 38  ALT 16   16 18 21   ALKPHOS 47   48 41 42  BILITOT 0.8   1.1 1.1 0.4  PROT 7.2   7.1 6.4* 6.9  ALBUMIN 3.9   3.8 3.3* 3.5    CBG: No results for input(s): GLUCAP in the last 168 hours.   Recent Results (from the past 240 hour(s))  Resp Panel by RT-PCR (Flu Fahim Kats&B, Covid) Nasopharyngeal Swab     Status: None   Collection Time: 06/03/21  4:28 PM   Specimen: Nasopharyngeal Swab; Nasopharyngeal(NP) swabs in vial transport medium  Result Value Ref Range Status   SARS Coronavirus 2 by RT PCR NEGATIVE NEGATIVE Final    Comment: (NOTE) SARS-CoV-2 target nucleic acids are NOT DETECTED.  The SARS-CoV-2 RNA is generally detectable in upper respiratory specimens during the acute phase of infection. The lowest concentration of SARS-CoV-2 viral copies this assay can  detect is 138 copies/mL. Tywana Robotham negative result does not preclude SARS-Cov-2 infection and should not be used as the sole basis for treatment or other patient management decisions. Ingris Pasquarella negative result may occur with  improper specimen collection/handling, submission of specimen other than nasopharyngeal swab, presence of viral mutation(s) within the areas targeted by this assay, and inadequate number of viral copies(<138 copies/mL). Ulus Hazen negative result must be combined with clinical observations, patient history, and epidemiological information. The expected result is Negative.  Fact Sheet for Patients:  EntrepreneurPulse.com.au  Fact Sheet for Healthcare Providers:  IncredibleEmployment.be  This test is no t yet approved or cleared by the [place] FDA and  has been authorized for detection and/or diagnosis of SARS-CoV-2 by FDA under an Emergency Use Authorization (EUA). This EUA will remain  in effect (meaning this test can be  used) for the duration of the COVID-19 declaration under Section 564(b)(1) of the Act, 21 U.S.C.section 360bbb-3(b)(1), unless the authorization is terminated  or revoked sooner.       Influenza Jahdiel Krol by PCR NEGATIVE NEGATIVE Final   Influenza B by PCR NEGATIVE NEGATIVE Final    Comment: (NOTE) The Xpert Xpress SARS-CoV-2/FLU/RSV plus assay is intended as an aid in the diagnosis of influenza from Nasopharyngeal swab specimens and should not be used as Gwenna Fuston sole basis for treatment. Nasal washings and aspirates are unacceptable for Xpert Xpress SARS-CoV-2/FLU/RSV testing.  Fact Sheet for Patients: EntrepreneurPulse.com.au  Fact Sheet for Healthcare Providers: IncredibleEmployment.be  This test is not yet approved or cleared by the [place] FDA and has been authorized for detection and/or diagnosis of SARS-CoV-2 by FDA under an Emergency Use Authorization (EUA). This EUA will remain in  effect (meaning this test can be used) for the duration of the COVID-19 declaration under Section 564(b)(1) of the Act, 21 U.S.C. section 360bbb-3(b)(1), unless the authorization is terminated or revoked.  Performed at [facility], [place] [address] [place], [place] [address]   Respiratory (~20 pathogens) panel by PCR     Status: None   Collection Time: 06/03/21  7:24 PM   Specimen: Nasopharyngeal Swab; Respiratory  Result Value Ref Range Status   Adenovirus NOT DETECTED NOT DETECTED Final   Coronavirus 229E NOT DETECTED NOT DETECTED Final    Comment: (NOTE) The Coronavirus on the Respiratory Panel, DOES NOT test for the novel  Coronavirus (2019 nCoV)    Coronavirus HKU1 NOT DETECTED NOT DETECTED Final   Coronavirus NL63 NOT DETECTED NOT DETECTED Final   Coronavirus OC43 NOT DETECTED NOT DETECTED Final   Metapneumovirus NOT DETECTED NOT DETECTED Final   Rhinovirus / Enterovirus NOT DETECTED NOT DETECTED Final   Influenza Haniyyah Sakuma NOT DETECTED NOT DETECTED Final   Influenza B NOT DETECTED NOT DETECTED Final   Parainfluenza Virus 1 NOT DETECTED NOT DETECTED Final   Parainfluenza Virus 2 NOT DETECTED NOT DETECTED Final   Parainfluenza Virus 3 NOT DETECTED NOT DETECTED Final   Parainfluenza Virus 4 NOT DETECTED NOT DETECTED Final   Respiratory Syncytial Virus NOT DETECTED NOT DETECTED Final   Bordetella pertussis NOT DETECTED NOT DETECTED Final   Bordetella Parapertussis NOT DETECTED NOT DETECTED Final   Chlamydophila pneumoniae NOT DETECTED NOT DETECTED Final   Mycoplasma pneumoniae NOT DETECTED NOT DETECTED Final    Comment: Performed at [facility], [place]. [address] [place], [place] [address]  Blood culture (routine x 2)     Status: None (Preliminary result)   Collection Time: 06/03/21  8:55 PM   Specimen: BLOOD  Result Value Ref Range Status   Specimen Description   Final    BLOOD RIGHT ANTECUBITAL Performed at [place] [address], [place], [place] [address]    Special Requests   Final    BOTTLES DRAWN AEROBIC AND ANAEROBIC Blood Culture results may not be optimal due to an inadequate volume of blood received in culture bottles Performed at [place] [address], [place], [place] [address]    Culture   Final    NO GROWTH 1 DAY Performed at [facility], [place] [address] [place], [place] [address]    Report Status PENDING  Incomplete  Blood culture (routine x 2)     Status: None (Preliminary result)   Collection Time: 06/03/21  8:59 PM   Specimen: BLOOD  Result Value  Ref Range Status   Specimen Description   Final    BLOOD BLOOD LEFT HAND Performed at [place] [address], [place], [place] [address]    Special Requests   Final    BOTTLES DRAWN AEROBIC AND ANAEROBIC Blood Culture adequate volume Performed at [facility] [place] [address] [place], [place] [address]    Culture   Final    NO GROWTH 1 DAY Performed at [facility], [place] [address], [place], [place] [address]    Report Status PENDING  Incomplete  C Difficile Quick Screen w PCR reflex     Status: None   Collection Time: 06/05/21 10:19 AM   Specimen: STOOL  Result Value Ref Range Status   C Diff antigen NEGATIVE NEGATIVE Final   C Diff toxin NEGATIVE NEGATIVE Final   C Diff interpretation No C. difficile detected.  Final    Comment: Performed at [facility], [place] [address], [place], [place] [address]         Radiology Studies: No results found.      Scheduled Meds:  aspirin  325 mg Oral Daily   citalopram  20 mg Oral Daily   clopidogrel  75 mg Oral Daily   Continuous Infusions:  sodium chloride     cefTRIAXone (ROCEPHIN)  IV 2 g (06/05/21 1603)     LOS: 1 day    Time spent: over 30 min    Fayrene Helper, MD [facility]   To contact the attending provider between 7A-7P or the covering provider during after  hours 7P-7A, please log into the web site [email or web address] and access using universal [place] password for that web site. If you do not have the password, please call the hospital operator.  06/05/2021, 8:04 PM

## 2021-06-05 NOTE — Assessment & Plan Note (Addendum)
Improved at discharge

## 2021-06-05 NOTE — Progress Notes (Addendum)
Patient got diarrhea multiple times at night, imodium administered X 1 per  on call provider order. Will continue to monitor.

## 2021-06-05 NOTE — Assessment & Plan Note (Signed)
Negative c diff Follow, monitor

## 2021-06-06 LAB — CBC WITH DIFFERENTIAL/PLATELET
Abs Immature Granulocytes: 0.02 10*3/uL (ref 0.00–0.07)
Basophils Absolute: 0 10*3/uL (ref 0.0–0.1)
Basophils Relative: 1 %
Eosinophils Absolute: 0.4 10*3/uL (ref 0.0–0.5)
Eosinophils Relative: 8 %
HCT: 29.6 % — ABNORMAL LOW (ref 39.0–52.0)
Hemoglobin: 9.8 g/dL — ABNORMAL LOW (ref 13.0–17.0)
Immature Granulocytes: 1 %
Lymphocytes Relative: 12 %
Lymphs Abs: 0.5 10*3/uL — ABNORMAL LOW (ref 0.7–4.0)
MCH: 32.5 pg (ref 26.0–34.0)
MCHC: 33.1 g/dL (ref 30.0–36.0)
MCV: 98 fL (ref 80.0–100.0)
Monocytes Absolute: 0.5 10*3/uL (ref 0.1–1.0)
Monocytes Relative: 10 %
Neutro Abs: 3 10*3/uL (ref 1.7–7.7)
Neutrophils Relative %: 68 %
Platelets: 183 10*3/uL (ref 150–400)
RBC: 3.02 MIL/uL — ABNORMAL LOW (ref 4.22–5.81)
RDW: 14.2 % (ref 11.5–15.5)
WBC: 4.4 10*3/uL (ref 4.0–10.5)
nRBC: 0 % (ref 0.0–0.2)

## 2021-06-06 LAB — COMPREHENSIVE METABOLIC PANEL
ALT: 17 U/L (ref 0–44)
AST: 27 U/L (ref 15–41)
Albumin: 3 g/dL — ABNORMAL LOW (ref 3.5–5.0)
Alkaline Phosphatase: 36 U/L — ABNORMAL LOW (ref 38–126)
Anion gap: 5 (ref 5–15)
BUN: 10 mg/dL (ref 8–23)
CO2: 23 mmol/L (ref 22–32)
Calcium: 8.3 mg/dL — ABNORMAL LOW (ref 8.9–10.3)
Chloride: 111 mmol/L (ref 98–111)
Creatinine, Ser: 0.6 mg/dL — ABNORMAL LOW (ref 0.61–1.24)
GFR, Estimated: >60 mL/min (ref >60)
Glucose, Bld: 122 mg/dL — ABNORMAL HIGH (ref 70–99)
Potassium: 3.5 mmol/L (ref 3.5–5.1)
Sodium: 139 mmol/L (ref 135–145)
Total Bilirubin: 0.2 mg/dL — ABNORMAL LOW (ref 0.3–1.2)
Total Protein: 6.2 g/dL — ABNORMAL LOW (ref 6.5–8.1)

## 2021-06-06 LAB — MAGNESIUM: Magnesium: 1.9 mg/dL (ref 1.7–2.4)

## 2021-06-06 LAB — CK: Total CK: 424 U/L — ABNORMAL HIGH (ref 49–397)

## 2021-06-06 LAB — VITAMIN B12: Vitamin B-12: 117 pg/mL — ABNORMAL LOW (ref 180–914)

## 2021-06-06 LAB — IRON AND TIBC
Iron: 32 ug/dL — ABNORMAL LOW (ref 45–182)
Saturation Ratios: 11 % — ABNORMAL LOW (ref 17.9–39.5)
TIBC: 282 ug/dL (ref 250–450)
UIBC: 250 ug/dL

## 2021-06-06 LAB — FOLATE: Folate: 13.1 ng/mL (ref >5.9)

## 2021-06-06 LAB — FERRITIN: Ferritin: 111 ng/mL (ref 24–336)

## 2021-06-06 LAB — PHOSPHORUS: Phosphorus: 2.9 mg/dL (ref 2.5–4.6)

## 2021-06-06 MED ORDER — FERROUS SULFATE 325 (65 FE) MG PO TABS
325.0000 mg | ORAL_TABLET | Freq: Every day | ORAL | Status: DC
  Administered 2021-06-06: 325 mg via ORAL
  Filled 2021-06-06: qty 1

## 2021-06-06 MED ORDER — VITAMIN B-12 1000 MCG PO TABS: 1000.0000 ug | ORAL_TABLET | Freq: Every day | ORAL | 0 refills | Status: AC

## 2021-06-06 MED ORDER — FERROUS SULFATE 325 (65 FE) MG PO TABS: 325.0000 mg | ORAL_TABLET | Freq: Every day | ORAL | 0 refills | Status: DC

## 2021-06-06 MED ORDER — CYANOCOBALAMIN 1000 MCG/ML IJ SOLN
1000.0000 ug | Freq: Every day | INTRAMUSCULAR | Status: DC
  Administered 2021-06-06: 1000 ug via INTRAMUSCULAR
  Filled 2021-06-06: qty 1

## 2021-06-06 NOTE — Discharge Summary (Signed)
Physician Discharge Steven [name]:[id number] DOB: 03/01/1935 DOA: 06/03/2021  PCP: [name], MD  Admit date: 06/03/2021 Discharge date: 06/06/2021  Time spent: 40 minutes  Recommendations for Outpatient Follow-up:  Follow outpatient CBC/CMP  Follow symptoms outpatient, he's improved now, discharge off abx - consider additional w/u (CT? Etc) if symptoms persistent Follow pending blood cultures (currently Ngx2) Follow b12 and iron def, discharged with supplementation Follow repeat CK outpatient  Discharge Diagnoses:  Principal Problem:   SIRS (systemic inflammatory response syndrome) (HCC) Active Problems:   Dehydration   Hypotension   Generalized weakness   Fall at home, initial encounter   Acute metabolic encephalopathy   Anemia   Elevated CK   Carotid artery stenosis   Diarrhea   Depression   Hypokalemia   Ankylosing spondylitis of lumbosacral region ([place])   Hyperlipidemia   Hypomagnesemia   Alcohol use   Rigors   Fall   Discharge Condition: stable  Diet recommendation: heart healthy  Filed Weights   06/04/21 1303  Weight: 62.3 kg    History of present illness:  87 yo with hx carotid artery stenosis s/p endarterectomy on aspirin/plavix, anemia, TIA, SBO and multiple other medical issues who presented with concern for fever, concern for infection, generalized weakness, and fall.  He was treated with broad spectrum abx with concern for infection, but cultures have remained negative to this point.    He'll discharge off antibiotics with plan for outpatient follow up.  See below for additional details  Hospital Course:  Assessment and Plan: * SIRS (systemic inflammatory response syndrome) ([place])- (present on admission) Leukocytosis, tachypnea.  Occasionally soft BP.  Reported fevers at home.   Sepsis ruled out CXR without acute cardiopulm abnormality, UA bland Negative covid, influenza Negative RVP He reports what sounds like rigors, will  follow blood cultures and continue abx for now S/p broad spectrum abx - will discharge off abx Will wait for 48 hr blood cx negative with hx rigors and immunosuppressed on methotrexate Blood cx NGx2 days, will discharge off abx Return precautions given, may need additional w/u outpatient (CT, etc) if recurrent symptoms  Fall at home, initial encounter In setting of generalized weakness with his fevers, achyness, etc CT head/neck without acute injury Negative plain films of L forearm  Generalized weakness improved  Hypotension improved  Acute metabolic encephalopathy- (present on admission) Seems essentially resolved  Elevated CK Improved at discharge  Anemia- (present on admission) Appears chronic, follow anemia labs With b12 and iron def Will discharge with supplementation  Diarrhea Negative c diff Follow, monitor  Carotid artery stenosis- (present on admission) Continue aspirin/plavix/statin S/p endarterctomy  Hypokalemia- (present on admission) Replace and follow  Depression- (present on admission) celexa  Ankylosing spondylitis of lumbosacral region [facility])- (present on admission) Noted on imaging   Procedures: none   Consultations: none  Discharge Exam: Vitals:   06/06/21 0629 06/06/21 0900  BP: (!) 111/51   Pulse: (!) 54   Resp: 17 20  Temp: 98.1 F (36.7 C)   SpO2: 98%    Feeling well Eager for discharge  General: No acute distress. Cardiovascular: RRR Lungs: Clear to auscultation bilaterally Abdomen: Soft, nontender, nondistended Neurological: Alert and oriented 3. Moves all extremities 4. Cranial nerves II through XII grossly intact. Skin: Warm and dry. No rashes or lesions. Extremities: No clubbing or cyanosis. No edema.   Discharge Instructions   Discharge Instructions     Call MD for:  difficulty breathing, headache or visual disturbances   Complete  by: As directed    Call MD for:  extreme fatigue   Complete by: As directed     Call MD for:  hives   Complete by: As directed    Call MD for:  persistant dizziness or light-headedness   Complete by: As directed    Call MD for:  persistant nausea and vomiting   Complete by: As directed    Call MD for:  redness, tenderness, or signs of infection (pain, swelling, redness, odor or green/yellow discharge around incision site)   Complete by: As directed    Call MD for:  severe uncontrolled pain   Complete by: As directed    Call MD for:  temperature >100.4   Complete by: As directed    Diet - low sodium heart healthy   Complete by: As directed    Discharge instructions   Complete by: As directed    You were seen with malaise and what sounded like shaking chills and fever concerning for infection.  You've improved with IV antibiotics, but there was not clear evidence of infection to this point.  We'll discontinue antibiotics at discharge.  You should follow your final culture results with your PCP.  You have evidence of b12 and iron deficiency.  We'll send you with b12 and iron supplementation.  You should follow up with your PCP these results and follow up recommendations.  Return for new, recurrent, or worsening symptoms.  Please ask your PCP to request records from this hospitalization so they know what was done and what the next steps will be.   Increase activity slowly   Complete by: As directed       Allergies as of 06/06/2021       Reactions   Other Hives   Indomethacin Hives, Other (See Comments)   Lactose Intolerance (gi)    GI UPSET        Medication List     TAKE these medications    acetaminophen 650 MG CR tablet Commonly known as: TYLENOL Take 1 tablet (650 mg total) by mouth every 8 (eight) hours as needed for pain or fever.   aspirin 325 MG EC tablet Take 1 tablet (325 mg total) by mouth daily.   B-complex with vitamin C tablet Take 1 tablet by mouth daily.   Benefiber Powd Take 1 Scoop by mouth daily.   Biotin 1 MG  Caps Take 1 capsule by mouth daily.   Biotin 5000 MCG Tabs Take 1 tablet by mouth daily.   Calcium Citrate 250 MG Tabs Take 1 tablet by mouth daily.   cholecalciferol 25 MCG (1000 UNIT) tablet Commonly known as: VITAMIN D Take 1,000 Units by mouth daily.   citalopram 20 MG tablet Commonly known as: CELEXA Take 20 mg by mouth daily.   clopidogrel 75 MG tablet Commonly known as: PLAVIX Take 1 tablet (75 mg total) by mouth daily.   ferrous sulfate 325 (65 FE) MG tablet Take 1 tablet (325 mg total) by mouth daily with breakfast. Start taking on: June 07, 7369   folic acid 1 MG tablet Commonly known as: FOLVITE Take 1 mg by mouth in the morning, at noon, and at bedtime.   ipratropium 0.06 % nasal spray Commonly known as: ATROVENT Place 2 sprays into both nostrils 2 (two) times daily as needed (allergies).   methotrexate 2.5 MG tablet Commonly known as: RHEUMATREX Take 20 mg by mouth once Conan Mcmanaway week. TAKE 8 TABLETS (20 MG) BY MOUTH ONCE Hendricks Schwandt WEEK.  pantoprazole 40 MG tablet Commonly known as: PROTONIX Take 1 tablet (40 mg total) by mouth daily.   PRESERVISION AREDS 2 PO Take 1 tablet by mouth in the morning and at bedtime.   rosuvastatin 20 MG tablet Commonly known as: CRESTOR Take 1 tablet (20 mg total) by mouth at bedtime.   Soothe XP Soln Place 1 drop into both eyes 2 (two) times daily.   vitamin B-12 1000 MCG tablet Commonly known as: CYANOCOBALAMIN Take 1 tablet (1,000 mcg total) by mouth daily. Follow up with your PCP for additional refills and recommendations for your b12 deficiency.       Allergies  Allergen Reactions   Other Hives   Indomethacin Hives and Other (See Comments)   Lactose Intolerance (Gi)     GI UPSET      The results of significant diagnostics from this hospitalization (including imaging, microbiology, ancillary and laboratory) are listed below for reference.    Significant Diagnostic Studies: DG Chest 2 View  Result Date:  06/03/2021 CLINICAL DATA:  Provided history: Fever.  Fall this morning. EXAM: CHEST - 2 VIEW COMPARISON:  Prior chest radiographs 12/17/2020 and earlier. FINDINGS: Heart size within normal limits. Aortic atherosclerosis. The patient's chin partially obscures the left lung apex on the AP radiograph. No appreciable airspace consolidation. No evidence of pleural effusion or pneumothorax. No acute bony abnormality identified. Multilevel ventral syndesmophytes within the thoracic spine suggesting ankylosing spondylitis. IMPRESSION: The patient's chin partially obscures the left lung apex on the AP radiograph. No evidence of acute cardiopulmonary abnormality. Aortic Atherosclerosis (ICD10-I70.0). Redemonstrated findings suggestive of ankylosing spondylitis. Electronically Signed   By: Kellie Simmering D.O.   On: 06/03/2021 15:58   DG Forearm Left  Result Date: 06/03/2021 CLINICAL DATA:  Fever, fell EXAM: LEFT FOREARM - 2 VIEW COMPARISON:  None. FINDINGS: There is no evidence of fracture or other focal bone lesions. Soft tissues are unremarkable. IMPRESSION: Negative. Electronically Signed   By: Lucrezia Europe M.D.   On: 06/03/2021 16:17   CT HEAD WO CONTRAST (5MM)  Result Date: 06/03/2021 CLINICAL DATA:  Head trauma, moderate-severe EXAM: CT HEAD WITHOUT CONTRAST TECHNIQUE: Contiguous axial images were obtained from the base of the skull through the vertex without intravenous contrast. RADIATION DOSE REDUCTION: This exam was performed according to the departmental dose-optimization program which includes automated exposure control, adjustment of the mA and/or kV according to patient size and/or use of iterative reconstruction technique. COMPARISON:  03/27/2021 FINDINGS: Brain: No evidence of acute infarction, hemorrhage, hydrocephalus, extra-axial collection or mass lesion/mass effect. Scattered low-density changes within the periventricular and subcortical white matter compatible with chronic microvascular ischemic  change. Mild diffuse cerebral volume loss. Vascular: No hyperdense vessel or unexpected calcification. Skull: Normal. Negative for fracture or focal lesion. Sinuses/Orbits: No acute finding. Other: Negative for scalp hematoma. IMPRESSION: 1. No acute intracranial abnormality. 2. Chronic microvascular ischemic change and cerebral volume loss. Electronically Signed   By: Davina Poke D.O.   On: 06/03/2021 16:09   CT Cervical Spine Wo Contrast  Result Date: 06/03/2021 CLINICAL DATA:  Status post fall.  Neck pain. EXAM: CT CERVICAL SPINE WITHOUT CONTRAST TECHNIQUE: Multidetector CT imaging of the cervical spine was performed without intravenous contrast. Multiplanar CT image reconstructions were also generated. RADIATION DOSE REDUCTION: This exam was performed according to the departmental dose-optimization program which includes automated exposure control, adjustment of the mA and/or kV according to patient size and/or use of iterative reconstruction technique. COMPARISON:  None. FINDINGS: Alignment: Anatomic alignment.  No static  listhesis. Skull base and vertebrae: No acute fracture. No aggressive lytic or sclerotic osseous lesion. Old avulsion fracture along the posterior tip of the C7 spinous process. Soft tissues and spinal canal: Intraspinal soft tissues are not fully imaged on this examination due to poor soft tissue contrast, but there is no gross soft tissue abnormality. No prevertebral fluid or swelling. No visible canal hematoma. Disc levels: Ankylosis of the cervicothoracic spine vertebral bodies and posterior elements from the occiput put through T3. The spine inferior to T3 is not included in the field of view. Upper chest: Lung apices are clear. Other: No fluid collection or hematoma. IMPRESSION: 1.  No acute osseous injury of the cervical spine. 2. Ankylosis of the cervicothoracic spine vertebral bodies and posterior elements from the occiput put through T3 consistent with ankylosing  spondylitis. Electronically Signed   By: Kathreen Devoid M.D.   On: 06/03/2021 16:11    Microbiology: Recent Results (from the past 240 hour(s))  Resp Panel by RT-PCR (Flu Artie Takayama&B, Covid) Nasopharyngeal Swab     Status: None   Collection Time: 06/03/21  4:28 PM   Specimen: Nasopharyngeal Swab; Nasopharyngeal(NP) swabs in vial transport medium  Result Value Ref Range Status   SARS Coronavirus 2 by RT PCR NEGATIVE NEGATIVE Final    Comment: (NOTE) SARS-CoV-2 target nucleic acids are NOT DETECTED.  The SARS-CoV-2 RNA is generally detectable in upper respiratory specimens during the acute phase of infection. The lowest concentration of SARS-CoV-2 viral copies this assay can detect is 138 copies/mL. Akim Watkinson negative result does not preclude SARS-Cov-2 infection and should not be used as the sole basis for treatment or other patient management decisions. Irie Dowson negative result may occur with  improper specimen collection/handling, submission of specimen other than nasopharyngeal swab, presence of viral mutation(s) within the areas targeted by this assay, and inadequate number of viral copies(<138 copies/mL). Brion Hedges negative result must be combined with clinical observations, patient history, and epidemiological information. The expected result is Negative.  Fact Sheet for Patients:  EntrepreneurPulse.com.au  Fact Sheet for Healthcare Providers:  IncredibleEmployment.be  This test is no t yet approved or cleared by the [place] FDA and  has been authorized for detection and/or diagnosis of SARS-CoV-2 by FDA under an Emergency Use Authorization (EUA). This EUA will remain  in effect (meaning this test can be used) for the duration of the COVID-19 declaration under Section 564(b)(1) of the Act, 21 U.S.C.section 360bbb-3(b)(1), unless the authorization is terminated  or revoked sooner.       Influenza Jorene Kaylor by PCR NEGATIVE NEGATIVE Final   Influenza B by PCR NEGATIVE  NEGATIVE Final    Comment: (NOTE) The Xpert Xpress SARS-CoV-2/FLU/RSV plus assay is intended as an aid in the diagnosis of influenza from Nasopharyngeal swab specimens and should not be used as Ustin Cruickshank sole basis for treatment. Nasal washings and aspirates are unacceptable for Xpert Xpress SARS-CoV-2/FLU/RSV testing.  Fact Sheet for Patients: EntrepreneurPulse.com.au  Fact Sheet for Healthcare Providers: IncredibleEmployment.be  This test is not yet approved or cleared by the [place] FDA and has been authorized for detection and/or diagnosis of SARS-CoV-2 by FDA under an Emergency Use Authorization (EUA). This EUA will remain in effect (meaning this test can be used) for the duration of the COVID-19 declaration under Section 564(b)(1) of the Act, 21 U.S.C. section 360bbb-3(b)(1), unless the authorization is terminated or revoked.  Performed at [facility], [place] [address] [place], [place] [address]   Respiratory (~20 pathogens) panel by PCR  Status: None   Collection Time: 06/03/21  7:24 PM   Specimen: Nasopharyngeal Swab; Respiratory  Result Value Ref Range Status   Adenovirus NOT DETECTED NOT DETECTED Final   Coronavirus 229E NOT DETECTED NOT DETECTED Final    Comment: (NOTE) The Coronavirus on the Respiratory Panel, DOES NOT test for the novel  Coronavirus (2019 nCoV)    Coronavirus HKU1 NOT DETECTED NOT DETECTED Final   Coronavirus NL63 NOT DETECTED NOT DETECTED Final   Coronavirus OC43 NOT DETECTED NOT DETECTED Final   Metapneumovirus NOT DETECTED NOT DETECTED Final   Rhinovirus / Enterovirus NOT DETECTED NOT DETECTED Final   Influenza Zamar Odwyer NOT DETECTED NOT DETECTED Final   Influenza B NOT DETECTED NOT DETECTED Final   Parainfluenza Virus 1 NOT DETECTED NOT DETECTED Final   Parainfluenza Virus 2 NOT DETECTED NOT DETECTED Final   Parainfluenza Virus 3 NOT DETECTED NOT DETECTED Final   Parainfluenza Virus 4 NOT  DETECTED NOT DETECTED Final   Respiratory Syncytial Virus NOT DETECTED NOT DETECTED Final   Bordetella pertussis NOT DETECTED NOT DETECTED Final   Bordetella Parapertussis NOT DETECTED NOT DETECTED Final   Chlamydophila pneumoniae NOT DETECTED NOT DETECTED Final   Mycoplasma pneumoniae NOT DETECTED NOT DETECTED Final    Comment: Performed at [facility], [place]. [address] [place], [place] [address]  Blood culture (routine x 2)     Status: None (Preliminary result)   Collection Time: 06/03/21  8:55 PM   Specimen: BLOOD  Result Value Ref Range Status   Specimen Description   Final    BLOOD RIGHT ANTECUBITAL Performed at [facility] [address], [place], [place] [address]    Special Requests   Final    BOTTLES DRAWN AEROBIC AND ANAEROBIC Blood Culture results may not be optimal due to an inadequate volume of blood received in culture bottles Performed at [facility] [address] [place], [place] [address]    Culture   Final    NO GROWTH 2 DAYS Performed at [facility] [address] [place], [place] [address]    Report Status PENDING  Incomplete  Blood culture (routine x 2)     Status: None (Preliminary result)   Collection Time: 06/03/21  8:59 PM   Specimen: BLOOD  Result Value Ref Range Status   Specimen Description   Final    BLOOD BLOOD LEFT HAND Performed at [facility] [address] [place], [place] [address]    Special Requests   Final    BOTTLES DRAWN AEROBIC AND ANAEROBIC Blood Culture adequate volume Performed at [facility] [address], [place], [place] [address]    Culture   Final    NO GROWTH 2 DAYS Performed at [facility] [address] [place], [place] [address]    Report Status PENDING  Incomplete  C Difficile Quick Screen w PCR reflex     Status: None   Collection Time: 06/05/21 10:19 AM   Specimen: STOOL  Result Value Ref Range Status   C  Diff antigen NEGATIVE NEGATIVE Final   C Diff toxin NEGATIVE NEGATIVE Final   C Diff interpretation No C. difficile detected.  Final    Comment: Performed at [facility], [place] [address], [place],  [address]     Labs: Basic Metabolic Panel: Recent Labs  Lab 06/03/21 1628 06/04/21 0630 06/05/21 0554 06/06/21 0611  NA 134* 130* 134* 139  K 3.6  3.0* 3.7 3.5  CL 102 101 109 111  CO2 24 22 22 23   GLUCOSE 157* 128* 129* 122*  BUN 15 13 12 10   CREATININE 0.83 0.71 0.82 0.60*  CALCIUM 8.5* 7.8* 8.1* 8.3*  MG 1.5* 2.2 2.1 1.9  PHOS 2.8 2.4* 1.5* 2.9   Liver Function Tests: Recent Labs  Lab 06/03/21 1628 06/04/21 0630 06/05/21 0554 06/06/21 0611  AST 21   20 36 38 27  ALT 16   16 18 21 17   ALKPHOS 47   48 41 42 36*  BILITOT 0.8   1.1 1.1 0.4 0.2*  PROT 7.2   7.1 6.4* 6.9 6.2*  ALBUMIN 3.9   3.8 3.3* 3.5 3.0*   No results for input(s): LIPASE, AMYLASE in the last 168 hours. Recent Labs  Lab 06/04/21 0109  AMMONIA 12   CBC: Recent Labs  Lab 06/03/21 1628 06/04/21 0630 06/05/21 0554 06/06/21 0611  WBC 11.4* 7.4 5.2 4.4  NEUTROABS 10.7* 6.7 3.7 3.0  HGB 11.4* 10.1* 10.4* 9.8*  HCT 35.1* 31.0* 32.7* 29.6*  MCV 100.3* 99.4 101.6* 98.0  PLT 239 179 194 183   Cardiac Enzymes: Recent Labs  Lab 06/03/21 1628 06/05/21 0557 06/06/21 0611  CKTOTAL 97 935* 424*   BNP: BNP (last 3 results) No results for input(s): BNP in the last 8760 hours.  ProBNP (last 3 results) No results for input(s): PROBNP in the last 8760 hours.  CBG: No results for input(s): GLUCAP in the last 168 hours.     Signed:  Fayrene Helper MD.  [facility] 06/06/2021, 12:25 PM

## 2021-06-06 NOTE — TOC Initial Note (Signed)
Transition of Care [facility]) - Initial/Assessment Note    Patient Details  Name: Steven [name] MRN: [id number] Date of Birth: 09/30/1934  [facility]) CM/SW Contact:    Lynnell Catalan, RN Phone Number: 06/06/2021, 10:43 AM  Clinical Narrative:                 Pt already established with Outpatient PT at [facility]. No additional TOC needs noted at this time.     Activities of Daily Living Home Assistive Devices/Equipment: None ADL Screening (condition at time of admission) Patient's cognitive ability adequate to safely complete daily activities?: No Is the patient deaf or have difficulty hearing?: Yes Does the patient have difficulty seeing, even when wearing glasses/contacts?: No Does the patient have difficulty concentrating, remembering, or making decisions?: No Patient able to express need for assistance with ADLs?: Yes Does the patient have difficulty dressing or bathing?: No Independently performs ADLs?: Yes (appropriate for developmental age) Does the patient have difficulty walking or climbing stairs?: Yes Weakness of Legs: Both Weakness of Arms/Hands: None     Admission diagnosis:  SIRS (systemic inflammatory response syndrome) (HCC) [R65.10] Generalized weakness [R53.1] Fall, initial encounter [W19.XXXA] Fever, unspecified fever cause [R50.9] Leukocytosis, unspecified type [D72.829] Fall [W19.XXXA] Patient Active Problem List   Diagnosis Date Noted   Elevated CK 06/05/2021   Diarrhea 06/05/2021   Rigors 06/04/2021   Hypotension 06/04/2021   Generalized weakness 06/04/2021   Fall at home, initial encounter 06/04/2021   Fall 06/04/2021   SIRS (systemic inflammatory response syndrome) ([place]) 06/03/2021   Dehydration 02/26/5207   Acute metabolic encephalopathy 06/05/3610   Carotid artery stenosis 06/03/2021   Alcohol use 06/03/2021   CVA (cerebral vascular accident) ([place]) 03/28/2021   Stenosis of left carotid artery    TIA (transient ischemic attack)  03/27/2021   Partial small bowel obstruction ([place]) 02/29/2020   Small bowel obstruction due to adhesions ([place]) 02/29/2020   Hypokalemia    Hypomagnesemia    Ankylosing spondylitis of lumbosacral region [facility])    Depression    Small bowel obstruction ([place]) 03/09/2019   Anemia 12/31/2017   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

## 2021-06-09 LAB — CULTURE, BLOOD (ROUTINE X 2)
Culture: NO GROWTH
Culture: NO GROWTH
Special Requests: ADEQUATE

## 2021-06-10 DIAGNOSIS — R2689 Other abnormalities of gait and mobility: Secondary | ICD-10-CM | POA: Diagnosis not present

## 2021-06-10 DIAGNOSIS — M6281 Muscle weakness (generalized): Secondary | ICD-10-CM | POA: Diagnosis not present

## 2021-06-10 DIAGNOSIS — G458 Other transient cerebral ischemic attacks and related syndromes: Secondary | ICD-10-CM | POA: Diagnosis not present

## 2021-06-11 DIAGNOSIS — G459 Transient cerebral ischemic attack, unspecified: Secondary | ICD-10-CM | POA: Diagnosis not present

## 2021-06-11 DIAGNOSIS — R748 Abnormal levels of other serum enzymes: Secondary | ICD-10-CM | POA: Diagnosis not present

## 2021-06-11 DIAGNOSIS — E611 Iron deficiency: Secondary | ICD-10-CM | POA: Diagnosis not present

## 2021-06-11 DIAGNOSIS — Z09 Encounter for follow-up examination after completed treatment for conditions other than malignant neoplasm: Secondary | ICD-10-CM | POA: Diagnosis not present

## 2021-06-11 DIAGNOSIS — R509 Fever, unspecified: Secondary | ICD-10-CM | POA: Diagnosis not present

## 2021-06-11 DIAGNOSIS — K5909 Other constipation: Secondary | ICD-10-CM | POA: Diagnosis not present

## 2021-06-11 DIAGNOSIS — R531 Weakness: Secondary | ICD-10-CM | POA: Diagnosis not present

## 2021-06-11 DIAGNOSIS — Z791 Long term (current) use of non-steroidal anti-inflammatories (NSAID): Secondary | ICD-10-CM | POA: Diagnosis not present

## 2021-06-17 DIAGNOSIS — M6281 Muscle weakness (generalized): Secondary | ICD-10-CM | POA: Diagnosis not present

## 2021-06-17 DIAGNOSIS — G458 Other transient cerebral ischemic attacks and related syndromes: Secondary | ICD-10-CM | POA: Diagnosis not present

## 2021-06-17 DIAGNOSIS — R2689 Other abnormalities of gait and mobility: Secondary | ICD-10-CM | POA: Diagnosis not present

## 2021-06-18 DIAGNOSIS — R194 Change in bowel habit: Secondary | ICD-10-CM | POA: Diagnosis not present

## 2021-06-24 DIAGNOSIS — M6281 Muscle weakness (generalized): Secondary | ICD-10-CM | POA: Diagnosis not present

## 2021-06-24 DIAGNOSIS — R2689 Other abnormalities of gait and mobility: Secondary | ICD-10-CM | POA: Diagnosis not present

## 2021-06-24 DIAGNOSIS — G458 Other transient cerebral ischemic attacks and related syndromes: Secondary | ICD-10-CM | POA: Diagnosis not present

## 2021-07-01 DIAGNOSIS — G458 Other transient cerebral ischemic attacks and related syndromes: Secondary | ICD-10-CM | POA: Diagnosis not present

## 2021-07-01 DIAGNOSIS — R2689 Other abnormalities of gait and mobility: Secondary | ICD-10-CM | POA: Diagnosis not present

## 2021-07-01 DIAGNOSIS — M6281 Muscle weakness (generalized): Secondary | ICD-10-CM | POA: Diagnosis not present

## 2021-07-08 DIAGNOSIS — R2689 Other abnormalities of gait and mobility: Secondary | ICD-10-CM | POA: Diagnosis not present

## 2021-07-08 DIAGNOSIS — G458 Other transient cerebral ischemic attacks and related syndromes: Secondary | ICD-10-CM | POA: Diagnosis not present

## 2021-07-08 DIAGNOSIS — M6281 Muscle weakness (generalized): Secondary | ICD-10-CM | POA: Diagnosis not present

## 2021-07-22 DIAGNOSIS — R2689 Other abnormalities of gait and mobility: Secondary | ICD-10-CM | POA: Diagnosis not present

## 2021-07-22 DIAGNOSIS — G458 Other transient cerebral ischemic attacks and related syndromes: Secondary | ICD-10-CM | POA: Diagnosis not present

## 2021-07-22 DIAGNOSIS — M6281 Muscle weakness (generalized): Secondary | ICD-10-CM | POA: Diagnosis not present

## 2021-07-29 DIAGNOSIS — R2689 Other abnormalities of gait and mobility: Secondary | ICD-10-CM | POA: Diagnosis not present

## 2021-07-29 DIAGNOSIS — G458 Other transient cerebral ischemic attacks and related syndromes: Secondary | ICD-10-CM | POA: Diagnosis not present

## 2021-07-29 DIAGNOSIS — M6281 Muscle weakness (generalized): Secondary | ICD-10-CM | POA: Diagnosis not present

## 2021-08-12 DIAGNOSIS — R2689 Other abnormalities of gait and mobility: Secondary | ICD-10-CM | POA: Diagnosis not present

## 2021-08-12 DIAGNOSIS — M6281 Muscle weakness (generalized): Secondary | ICD-10-CM | POA: Diagnosis not present

## 2021-08-12 DIAGNOSIS — G458 Other transient cerebral ischemic attacks and related syndromes: Secondary | ICD-10-CM | POA: Diagnosis not present

## 2021-09-01 ENCOUNTER — Emergency Department (HOSPITAL_COMMUNITY): Payer: Medicare Other

## 2021-09-01 ENCOUNTER — Encounter (HOSPITAL_COMMUNITY): Payer: Self-pay

## 2021-09-01 ENCOUNTER — Emergency Department (HOSPITAL_COMMUNITY)
Admission: EM | Admit: 2021-09-01 | Discharge: 2021-09-01 | Disposition: A | Discharge status: Home / Self Care | Payer: Medicare Other | Attending: Emergency Medicine | Admitting: Emergency Medicine

## 2021-09-01 DIAGNOSIS — D539 Nutritional anemia, unspecified: Secondary | ICD-10-CM | POA: Diagnosis not present

## 2021-09-01 DIAGNOSIS — Z23 Encounter for immunization: Secondary | ICD-10-CM | POA: Insufficient documentation

## 2021-09-01 DIAGNOSIS — Z7902 Long term (current) use of antithrombotics/antiplatelets: Secondary | ICD-10-CM | POA: Insufficient documentation

## 2021-09-01 DIAGNOSIS — R519 Headache, unspecified: Secondary | ICD-10-CM | POA: Diagnosis not present

## 2021-09-01 DIAGNOSIS — R739 Hyperglycemia, unspecified: Secondary | ICD-10-CM | POA: Insufficient documentation

## 2021-09-01 DIAGNOSIS — W19XXXA Unspecified fall, initial encounter: Secondary | ICD-10-CM

## 2021-09-01 DIAGNOSIS — R9431 Abnormal electrocardiogram [ECG] [EKG]: Secondary | ICD-10-CM | POA: Diagnosis not present

## 2021-09-01 DIAGNOSIS — W01198A Fall on same level from slipping, tripping and stumbling with subsequent striking against other object, initial encounter: Secondary | ICD-10-CM | POA: Diagnosis not present

## 2021-09-01 DIAGNOSIS — R6889 Other general symptoms and signs: Secondary | ICD-10-CM | POA: Diagnosis not present

## 2021-09-01 DIAGNOSIS — D649 Anemia, unspecified: Secondary | ICD-10-CM | POA: Insufficient documentation

## 2021-09-01 DIAGNOSIS — S0101XA Laceration without foreign body of scalp, initial encounter: Secondary | ICD-10-CM | POA: Diagnosis not present

## 2021-09-01 DIAGNOSIS — Z7901 Long term (current) use of anticoagulants: Secondary | ICD-10-CM | POA: Insufficient documentation

## 2021-09-01 DIAGNOSIS — S0990XA Unspecified injury of head, initial encounter: Secondary | ICD-10-CM | POA: Diagnosis not present

## 2021-09-01 DIAGNOSIS — Z743 Need for continuous supervision: Secondary | ICD-10-CM | POA: Diagnosis not present

## 2021-09-01 DIAGNOSIS — Z7982 Long term (current) use of aspirin: Secondary | ICD-10-CM | POA: Insufficient documentation

## 2021-09-01 LAB — CBC
HCT: 28.7 % — ABNORMAL LOW (ref 39.0–52.0)
Hemoglobin: 8.6 g/dL — ABNORMAL LOW (ref 13.0–17.0)
MCH: 27.3 pg (ref 26.0–34.0)
MCHC: 30 g/dL (ref 30.0–36.0)
MCV: 91.1 fL (ref 80.0–100.0)
Platelets: 249 10*3/uL (ref 150–400)
RBC: 3.15 MIL/uL — ABNORMAL LOW (ref 4.22–5.81)
RDW: 15.6 % — ABNORMAL HIGH (ref 11.5–15.5)
WBC: 4.6 10*3/uL (ref 4.0–10.5)
nRBC: 0 % (ref 0.0–0.2)

## 2021-09-01 LAB — BASIC METABOLIC PANEL
Anion gap: 6 (ref 5–15)
BUN: 10 mg/dL (ref 8–23)
CO2: 25 mmol/L (ref 22–32)
Calcium: 8.5 mg/dL — ABNORMAL LOW (ref 8.9–10.3)
Chloride: 107 mmol/L (ref 98–111)
Creatinine, Ser: 0.74 mg/dL (ref 0.61–1.24)
GFR, Estimated: >60 mL/min (ref >60)
Glucose, Bld: 141 mg/dL — ABNORMAL HIGH (ref 70–99)
Potassium: 4.2 mmol/L (ref 3.5–5.1)
Sodium: 138 mmol/L (ref 135–145)

## 2021-09-01 LAB — ETHANOL: Alcohol, Ethyl (B): 181 mg/dL — ABNORMAL HIGH (ref <10)

## 2021-09-01 MED ORDER — TETANUS-DIPHTH-ACELL PERTUSSIS 5-2.5-18.5 LF-MCG/0.5 IM SUSY
0.5000 mL | PREFILLED_SYRINGE | Freq: Once | INTRAMUSCULAR | Status: AC
  Administered 2021-09-01: 0.5 mL via INTRAMUSCULAR
  Filled 2021-09-01: qty 0.5

## 2021-09-01 NOTE — Progress Notes (Signed)
Orthopedic Tech Progress Note Patient Details:  Steven [name] [birth year] [id number]  Patient ID: [name], male   DOB: March 28, 1935, 87 y.o.   MRN: [id number] I attended trauma page. Karolee Stamps 09/01/2021, 6:14 AM

## 2021-09-01 NOTE — ED Provider Notes (Signed)
[facility] EMERGENCY DEPARTMENT Provider Note   CSN: [id number] Arrival date & time: 09/01/21  0147     History  Chief complaint - fall, head injury  Steven [name] is a 87 y.o. male.  The history is provided by the patient and the EMS personnel.     Patient presents after a fall.  He reports he was bending over to pet his cat when he lost his balance and fell.  He struck in the back of his head and had a wound that was bleeding and he reports he is on Plavix.  He has had mild headache but no other acute complaints.  No LOC No other acute complaints Home Medications Prior to Admission medications   Medication Sig Start Date End Date Taking? Authorizing Provider  acetaminophen (TYLENOL) 650 MG CR tablet Take 1 tablet (650 mg total) by mouth every 8 (eight) hours as needed for pain or fever. 04/01/21   Modena Jansky, MD  Artificial Tear Solution (SOOTHE XP) SOLN Place 1 drop into both eyes 2 (two) times daily.    [provider]  aspirin EC 325 MG EC tablet Take 1 tablet (325 mg total) by mouth daily. 04/02/21   Hongalgi, Lenis Dickinson, MD  B Complex-C (B-COMPLEX WITH VITAMIN C) tablet Take 1 tablet by mouth daily.     [provider]  Biotin 1 MG CAPS Take 1 capsule by mouth daily.     [provider]  Biotin 5000 MCG TABS Take 1 tablet by mouth daily.    [provider]  Calcium Citrate 250 MG TABS Take 1 tablet by mouth daily.    [provider]  cholecalciferol (VITAMIN D) 1000 units tablet Take 1,000 Units by mouth daily.    [provider]  citalopram (CELEXA) 20 MG tablet Take 20 mg by mouth daily.     [provider]  clopidogrel (PLAVIX) 75 MG tablet Take 1 tablet (75 mg total) by mouth daily. 04/02/21   Hongalgi, Lenis Dickinson, MD  ferrous sulfate 325 (65 FE) MG tablet Take 1 tablet (325 mg total) by mouth daily with breakfast. 06/07/21 07/07/21  Elodia Florence., MD  folic acid (FOLVITE) 1 MG tablet  Take 1 mg by mouth in the morning, at noon, and at bedtime.    [provider]  ipratropium (ATROVENT) 0.06 % nasal spray Place 2 sprays into both nostrils 2 (two) times daily as needed (allergies). 06/05/19   [provider]  methotrexate (RHEUMATREX) 2.5 MG tablet Take 20 mg by mouth once a week. TAKE 8 TABLETS (20 MG) BY MOUTH ONCE A WEEK.    [provider]  Multiple Vitamins-Minerals (PRESERVISION AREDS 2 PO) Take 1 tablet by mouth in the morning and at bedtime.    [provider]  pantoprazole (PROTONIX) 40 MG tablet Take 1 tablet (40 mg total) by mouth daily. 04/02/21   Hongalgi, Lenis Dickinson, MD  rosuvastatin (CRESTOR) 20 MG tablet Take 1 tablet (20 mg total) by mouth at bedtime. 04/01/21   Hongalgi, Lenis Dickinson, MD  Wheat Dextrin (BENEFIBER) POWD Take 1 Scoop by mouth daily. 10/31/20   [provider]      Allergies    Other, Indomethacin, and Lactose intolerance (gi)    Review of Systems   Review of Systems  Musculoskeletal:  Negative for back pain and neck pain.  Skin:  Positive for wound.  Neurological:  Positive for headaches.   Physical Exam Updated Vital Signs  BP (!) 100/51   Pulse 60   Temp 98.3 F (36.8 C) (Oral)   Resp 17   Ht 1.727 m ('5\' 8"'$ )   Wt 62.3 kg   SpO2 99%   BMI 20.88 kg/m  Physical Exam CONSTITUTIONAL: Elderly, friendly, no acute distress HEAD: Wound noted to posterior scalp, no active bleed EYES: EOMI/PERRL ENMT: Mucous membranes moist NECK: supple no meningeal signs SPINE/BACK:entire spine nontender, no bruising/crepitance/stepoffs noted to spine CV: S1/S2 noted, no murmurs/rubs/gallops noted LUNGS: Lungs are clear to auscultation bilaterally, no apparent distress ABDOMEN: soft, nontender NEURO: Pt is awake/alert/appropriate, moves all extremitiesx4.  No facial droop.  GCS 15 EXTREMITIES: pulses normal/equal, full ROM, all other extremities/joints palpated/ranged and nontender SKIN: warm, color normal PSYCH:  no abnormalities of mood noted, alert and oriented to situation  ED Results / Procedures / Treatments   Labs (all labs ordered are listed, but only abnormal results are displayed) Labs Reviewed  CBC - Abnormal; Notable for the following components:      Result Value   RBC 3.15 (*)    Hemoglobin 8.6 (*)    HCT 28.7 (*)    RDW 15.6 (*)    All other components within normal limits  ETHANOL - Abnormal; Notable for the following components:   Alcohol, Ethyl (B) 181 (*)    All other components within normal limits  BASIC METABOLIC PANEL - Abnormal; Notable for the following components:   Glucose, Bld 141 (*)    Calcium 8.5 (*)    All other components within normal limits    EKG EKG Interpretation  Date/Time:  Monday Sep 01 2021 01:56:46 EDT Ventricular Rate:  60 PR Interval:  268 QRS Duration: 141 QT Interval:  455 QTC Calculation: 455 R Axis:   76 Text Interpretation: Sinus rhythm Prolonged PR interval Right bundle branch block No significant change since last tracing Confirmed by Ripley Fraise [phone]) on 09/01/2021 2:22:44 AM  Radiology CT HEAD WO CONTRAST  Result Date: 09/01/2021 CLINICAL DATA:  Fall.  Head trauma. EXAM: CT HEAD WITHOUT CONTRAST TECHNIQUE: Contiguous axial images were obtained from the base of the skull through the vertex without intravenous contrast. RADIATION DOSE REDUCTION: This exam was performed according to the departmental dose-optimization program which includes automated exposure control, adjustment of the mA and/or kV according to patient size and/or use of iterative reconstruction technique. COMPARISON:  Head CT dated 06/03/2021. FINDINGS: Brain: Mild age-related atrophy and chronic microvascular ischemic changes. There is no acute intracranial hemorrhage. No mass effect or midline shift. No extra-axial fluid collection. Vascular: No hyperdense vessel or unexpected calcification. Skull: Normal. Negative for fracture or focal lesion. Sinuses/Orbits: No  acute finding. Other: None IMPRESSION: 1. No acute intracranial pathology. 2. Mild age-related atrophy and chronic microvascular ischemic changes. Electronically Signed   By: Anner Crete M.D.   On: 09/01/2021 02:46    Procedures .Marland KitchenLaceration Repair  Date/Time: 09/01/2021 4:09 AM Performed by: Ripley Fraise, MD Authorized by: Ripley Fraise, MD   Consent:    Consent obtained:  Verbal   Consent given by:  Patient   Alternatives discussed:  No treatment Laceration details:    Location:  Scalp   Scalp location:  Occipital   Length (cm):  1 Exploration:    Contaminated: no   Treatment:    Amount of cleaning:  Standard Skin repair:    Repair method:  Staples   Number of staples:  2 Approximation:    Approximation:  Close Repair type:    Repair type:  Simple  Post-procedure details:    Procedure completion:  Tolerated well, no immediate complications Comments:     Wound was cleaned extensively by nursing staff.  The wound was very clean and not contaminated.  No active bleeding.  2 staples placed without difficulty    Medications Ordered in ED Medications  Tdap (BOOSTRIX) injection 0.5 mL (0.5 mLs Intramuscular Given 09/01/21 0238)    ED Course/ Medical Decision Making/ A&P Clinical Course as of 09/01/21 0408  Mon Sep 01, 2021  0221 Hemoglobin(!): 8.6 Chronic anemia noted [DW]  0231 Glucose(!): 141 Hyperglycemia [DW]  0407 Alcohol intoxication likely contributed patient's fall.  Patient is awake alert GCS 15.  CT head negative.  I was able to repair the wound without difficulty.  Once patient ambulates he can be discharged. [DW]    Clinical Course User Index [DW] Ripley Fraise, MD         Glasgow Coma Scale Score: 15      NEXUS Criteria Score: 0            Medical Decision Making Amount and/or Complexity of Data Reviewed Labs: ordered. Decision-making details documented in ED Course. Radiology: ordered.  Risk Prescription drug management.   This  patient presents to the ED for concern of fall and head injury, this involves an extensive number of treatment options, and is a complaint that carries with it a high risk of complications and morbidity.  The differential diagnosis includes but is not limited to intracranial hemorrhage, subdural hematoma, subarachnoid  Comorbidities that complicate the patient evaluation: Patient's presentation is complicated by their history of carotid disease, use of antiplatelet therapy   Additional history obtained: Additional history obtained from EMS discussed with paramedic at bedside Records reviewed previous admission documents  Lab Tests: I Ordered, and personally interpreted labs.  The pertinent results include: Alcohol intoxication, chronic anemia  Imaging Studies ordered: I ordered imaging studies including CT scan head   I independently visualized and interpreted imaging which showed no acute finding I agree with the radiologist interpretation  Cardiac Monitoring: The patient was maintained on a cardiac monitor.  I personally viewed and interpreted the cardiac monitor which showed an underlying rhythm of:  sinus rhythm   Reevaluation: After the interventions noted above, I reevaluated the patient and found that they have :improved  Complexity of problems addressed: Patient's presentation is most consistent with  acute presentation with potential threat to life or bodily function  Disposition: After consideration of the diagnostic results and the patient's response to treatment,  I feel that the patent would benefit from discharge   .           Final Clinical Impression(s) / ED Diagnoses Final diagnoses:  Fall, initial encounter  Laceration of scalp, initial encounter    Rx / DC Orders ED Discharge Orders     None         Ripley Fraise, MD 09/01/21 [phone]

## 2021-09-09 ENCOUNTER — Other Ambulatory Visit: Payer: Self-pay

## 2021-09-09 ENCOUNTER — Ambulatory Visit
Admission: EM | Admit: 2021-09-09 | Discharge: 2021-09-09 | Disposition: A | Discharge status: Home / Self Care | Payer: Medicare Other | Attending: Internal Medicine | Admitting: Internal Medicine

## 2021-09-09 ENCOUNTER — Encounter: Payer: Self-pay | Admitting: Emergency Medicine

## 2021-09-09 NOTE — ED Triage Notes (Signed)
Pt here for staple removal from back of head; 2 staples removed and wound appears well healed

## 2021-09-15 DIAGNOSIS — N182 Chronic kidney disease, stage 2 (mild): Secondary | ICD-10-CM | POA: Diagnosis not present

## 2021-09-15 DIAGNOSIS — E785 Hyperlipidemia, unspecified: Secondary | ICD-10-CM | POA: Diagnosis not present

## 2021-09-15 DIAGNOSIS — E1122 Type 2 diabetes mellitus with diabetic chronic kidney disease: Secondary | ICD-10-CM | POA: Diagnosis not present

## 2021-09-22 DIAGNOSIS — M459 Ankylosing spondylitis of unspecified sites in spine: Secondary | ICD-10-CM | POA: Diagnosis not present

## 2021-09-22 DIAGNOSIS — Z0000 Encounter for general adult medical examination without abnormal findings: Secondary | ICD-10-CM | POA: Diagnosis not present

## 2021-09-22 DIAGNOSIS — D509 Iron deficiency anemia, unspecified: Secondary | ICD-10-CM | POA: Diagnosis not present

## 2021-09-22 DIAGNOSIS — E559 Vitamin D deficiency, unspecified: Secondary | ICD-10-CM | POA: Diagnosis not present

## 2021-09-22 DIAGNOSIS — I6522 Occlusion and stenosis of left carotid artery: Secondary | ICD-10-CM | POA: Diagnosis not present

## 2021-09-22 DIAGNOSIS — E118 Type 2 diabetes mellitus with unspecified complications: Secondary | ICD-10-CM | POA: Diagnosis not present

## 2021-09-22 DIAGNOSIS — K582 Mixed irritable bowel syndrome: Secondary | ICD-10-CM | POA: Diagnosis not present

## 2021-09-22 DIAGNOSIS — Z791 Long term (current) use of non-steroidal anti-inflammatories (NSAID): Secondary | ICD-10-CM | POA: Diagnosis not present

## 2021-09-22 DIAGNOSIS — M6281 Muscle weakness (generalized): Secondary | ICD-10-CM | POA: Diagnosis not present

## 2021-09-25 ENCOUNTER — Other Ambulatory Visit: Payer: Self-pay | Admitting: Hematology and Oncology

## 2021-09-25 ENCOUNTER — Telehealth: Payer: Self-pay | Admitting: Hematology and Oncology

## 2021-09-25 ENCOUNTER — Other Ambulatory Visit: Payer: Self-pay

## 2021-09-25 ENCOUNTER — Inpatient Hospital Stay: Payer: Medicare Other | Attending: Hematology and Oncology | Admitting: Hematology and Oncology

## 2021-09-25 ENCOUNTER — Inpatient Hospital Stay: Payer: Medicare Other

## 2021-09-25 VITALS — BP 111/58 | HR 73 | Temp 97.8°F | Resp 17 | Ht 68.0 in | Wt 142.1 lb

## 2021-09-25 DIAGNOSIS — Z79899 Other long term (current) drug therapy: Secondary | ICD-10-CM | POA: Insufficient documentation

## 2021-09-25 DIAGNOSIS — K565 Intestinal adhesions [bands], unspecified as to partial versus complete obstruction: Secondary | ICD-10-CM

## 2021-09-25 DIAGNOSIS — Z87891 Personal history of nicotine dependence: Secondary | ICD-10-CM | POA: Insufficient documentation

## 2021-09-25 DIAGNOSIS — D500 Iron deficiency anemia secondary to blood loss (chronic): Secondary | ICD-10-CM

## 2021-09-25 DIAGNOSIS — D649 Anemia, unspecified: Secondary | ICD-10-CM | POA: Diagnosis not present

## 2021-09-25 DIAGNOSIS — Z7982 Long term (current) use of aspirin: Secondary | ICD-10-CM | POA: Insufficient documentation

## 2021-09-25 DIAGNOSIS — D509 Iron deficiency anemia, unspecified: Secondary | ICD-10-CM | POA: Insufficient documentation

## 2021-09-25 LAB — CMP (CANCER CENTER ONLY)
ALT: 8 U/L (ref 0–44)
AST: 12 U/L — ABNORMAL LOW (ref 15–41)
Albumin: 4 g/dL (ref 3.5–5.0)
Alkaline Phosphatase: 52 U/L (ref 38–126)
Anion gap: 4 — ABNORMAL LOW (ref 5–15)
BUN: 19 mg/dL (ref 8–23)
CO2: 29 mmol/L (ref 22–32)
Calcium: 9.3 mg/dL (ref 8.9–10.3)
Chloride: 104 mmol/L (ref 98–111)
Creatinine: 0.97 mg/dL (ref 0.61–1.24)
GFR, Estimated: >60 mL/min (ref >60)
Glucose, Bld: 155 mg/dL — ABNORMAL HIGH (ref 70–99)
Potassium: 4.4 mmol/L (ref 3.5–5.1)
Sodium: 137 mmol/L (ref 135–145)
Total Bilirubin: 0.3 mg/dL (ref 0.3–1.2)
Total Protein: 7.3 g/dL (ref 6.5–8.1)

## 2021-09-25 LAB — RETIC PANEL
Immature Retic Fract: 23.6 % — ABNORMAL HIGH (ref 2.3–15.9)
RBC.: 3.13 MIL/uL — ABNORMAL LOW (ref 4.22–5.81)
Retic Count, Absolute: 23.5 10*3/uL (ref 19.0–186.0)
Retic Ct Pct: 0.8 % (ref 0.4–3.1)
Reticulocyte Hemoglobin: 27.6 pg — ABNORMAL LOW (ref >27.9)

## 2021-09-25 LAB — CBC WITH DIFFERENTIAL (CANCER CENTER ONLY)
Abs Immature Granulocytes: 0.02 10*3/uL (ref 0.00–0.07)
Basophils Absolute: 0 10*3/uL (ref 0.0–0.1)
Basophils Relative: 1 %
Eosinophils Absolute: 0.4 10*3/uL (ref 0.0–0.5)
Eosinophils Relative: 5 %
HCT: 26.2 % — ABNORMAL LOW (ref 39.0–52.0)
Hemoglobin: 8.2 g/dL — ABNORMAL LOW (ref 13.0–17.0)
Immature Granulocytes: 0 %
Lymphocytes Relative: 15 %
Lymphs Abs: 1.1 10*3/uL (ref 0.7–4.0)
MCH: 26 pg (ref 26.0–34.0)
MCHC: 31.3 g/dL (ref 30.0–36.0)
MCV: 83.2 fL (ref 80.0–100.0)
Monocytes Absolute: 0.6 10*3/uL (ref 0.1–1.0)
Monocytes Relative: 8 %
Neutro Abs: 5 10*3/uL (ref 1.7–7.7)
Neutrophils Relative %: 71 %
Platelet Count: 330 10*3/uL (ref 150–400)
RBC: 3.15 MIL/uL — ABNORMAL LOW (ref 4.22–5.81)
RDW: 16.2 % — ABNORMAL HIGH (ref 11.5–15.5)
WBC Count: 7.1 10*3/uL (ref 4.0–10.5)
nRBC: 0 % (ref 0.0–0.2)

## 2021-09-25 LAB — IRON AND IRON BINDING CAPACITY (CC-WL,HP ONLY)
Iron: 9 ug/dL — ABNORMAL LOW (ref 45–182)
Saturation Ratios: 2 % — ABNORMAL LOW (ref 17.9–39.5)
TIBC: 463 ug/dL — ABNORMAL HIGH (ref 250–450)
UIBC: 454 ug/dL — ABNORMAL HIGH (ref 117–376)

## 2021-09-25 NOTE — Progress Notes (Signed)
[place] [phone]   [phone]  PROGRESS NOTE  Patient Care Team: [name], MD as PCP - General (Internal Medicine) Orson Slick, MD as Consulting Physician (Hematology and Oncology)  Hematological/Oncological History #Normocytic Anemia #Iron Deficiency Anemia 1) 01/01/2017: WBC 5.1, Hgb 11.0, Plt 254, MCV 91.6 2) 12/07/2017: WBC 8.0, Hgb 11.9, Plt 352, MCV 91.4. Iron 76, TIBC 359, Sat 21%, Ferritin 31 3) 03/09/2019: WBC 14.3, Hgb 11.1, Plt 374, MCV 88.8 4) 05/22/2019: WBC 5.5, Hgb 10.7, Plt 311, MCV 83.7. Routine PCP visit. 5) 06/14/2019: establish care with Dr. Lorenso Courier. WBC 5.0, Hgb 10.3, MCV 84.9, Plt 320 6) 09/14/2019: WBC 6.2, Hgb 10.9, MCV 91.2, Plt 209 7)  11/01/2019: WBC 9.2, Hgb 12.6, MVC 97.2, Plt 299 8) 11/05/2019: patient admitted with SBO, instructed to stop PO iron indefinitely.  9) 02/28/2020: admitted with SBO, Hgb drop to 9.6 while inpatient 10) 05/17/2020: Hgb 11.7, increased without intervention.  11) 09/05/2020: WBC 6.6, Hgb 10.9, MCV 94.6, Plt 259 12) 6/6-6/17/2022: IV feraheme 510 mg x 2 doses   Interval History:  Steven [name] 87 y.o. male with medical history significant for iron deficiency anemia who presents for a follow up visit. The patient's last visit was on 03/27/2021. In the interim since the last visit Steven [name] had ED visits on 09/01/2021 for a fall.   On exam today Steven [name] notes he had a mechanical fall interim since her last visit.  He reports that he was watching TV and stood up and then bent down to pet his cat.  As he was trying to straighten back up he lost his balance fell backwards and hit his head.  He reports that he had such severe bleeding of the blood all over his teacher and soaked it.  He notes that there is "blood everywhere".  He notes he is currently started back on iron pills which she is tolerating well without stomach upset or constipation.  He notes that he is also currently trying stool cards  checking for occult blood with his PCP.  Otherwise has been his baseline level of health.  He denies any shortness of breath or fatigue.  He denies having fevers, chills, sweats, nausea, vomiting or diarrhea.  Otherwise a full 10 point ROS is listed below.   MEDICAL HISTORY:  Past Medical History:  Diagnosis Date   Allergy    enviromental   Anemia 2017   Anxiety    Arthritis    ankylosing spodilitis   Blood transfusion without reported diagnosis    during surgery   Cataract    Depression    Dyspnea    with exertion - had Echo done 09/29/16   History of kidney stones    Hyperlipidemia    Intestinal obstruction ([place])    Pneumonia    as a child    SURGICAL HISTORY: Past Surgical History:  Procedure Laterality Date   ABDOMINAL SURGERY     had abcess   APPENDECTOMY     CHOLECYSTECTOMY     with lysis of adhesions   COLONOSCOPY     COLOSTOMY     COLOSTOMY REVERSAL     EYE SURGERY Left    scar tissue removed from cornea   EYE SURGERY Right    cataract surgery with lens implant   JOINT REPLACEMENT Right    hip  x 2 1999 and 2007   SHOULDER ARTHROSCOPY WITH ROTATOR CUFF REPAIR AND SUBACROMIAL DECOMPRESSION Left 03/02/2013   Procedure: LEFT  SHOULDER ARTHROSCOPY WITH SUBACROMIAL DECOMPRESSION DISTAL CALVICLE RESECTION AND ROTATOR CUFF REPAIR ;  Surgeon: [name], MD;  Location: [facility];  Service: Orthopedics;  Laterality: Left;   TOTAL HIP REVISION Right 11/06/2016   Procedure: TOTAL HIP REVISION OF THE ACETABULAR COMPONENT;  Surgeon: [name], MD;  Location: [place];  Service: Orthopedics;  Laterality: Right;   TRANSCAROTID ARTERY REVASCULARIZATION  Left 03/31/2021   Procedure: TRANSCAROTID ARTERY REVASCULARIZATION;  Surgeon: Broadus , MD;  Location: [facility] OR;  Service: Vascular;  Laterality: Left;   ULTRASOUND GUIDANCE FOR VASCULAR ACCESS Right 03/31/2021   Procedure: ULTRASOUND GUIDANCE FOR VASCULAR ACCESS;  Surgeon: Broadus , MD;  Location: [facility] OR;  Service:  Vascular;  Laterality: Right;    SOCIAL HISTORY: Social History   Socioeconomic History   Marital status: Married    Spouse name: Not on file   Number of children: 2   Years of education: Not on file   Highest education level: Not on file  Occupational History   Occupation: retired  Tobacco Use   Smoking status: Former   Smokeless tobacco: Never  Scientific laboratory technician Use: Never used  Substance and Sexual Activity   Alcohol use: Yes    Comment: 3 beers or glasses of wine a day--reports stopped ETOH 11/01/16    Drug use: No   Sexual activity: Never  Other Topics Concern   Not on file  Social History Narrative   Not on file   Social Determinants of Health   Financial Resource Strain: Low Risk  (03/09/2019)   Overall Financial Resource Strain (CARDIA)    Difficulty of Paying Living Expenses: Not hard at all  Food Insecurity: No Food Insecurity (03/09/2019)   Hunger Vital Sign    Worried About Running Out of Food in the Last Year: Never true    [place] in the Last Year: Never true  Transportation Needs: No Transportation Needs (03/09/2019)   PRAPARE - Hydrologist (Medical): No    Lack of Transportation (Non-Medical): No  Physical Activity: Insufficiently Active (03/09/2019)   Exercise Vital Sign    Days of Exercise per Week: 5 days    Minutes of Exercise per Session: 10 min  Stress: No Stress Concern Present (03/09/2019)   [place]    Feeling of Stress : Not at all  Social Connections: Unknown (03/09/2019)   Social Connection and Isolation Panel [NHANES]    Frequency of Communication with Friends and Family: Patient refused    Frequency of Social Gatherings with Friends and Family: Patient refused    Attends Religious Services: Patient refused    Active Member of Clubs or Organizations: Patient refused    Attends Archivist Meetings: Patient refused     Marital Status: Patient refused  Intimate Partner Violence: Not At Risk (03/09/2019)   Humiliation, Afraid, Rape, and Kick questionnaire    Fear of Current or Ex-Partner: No    Emotionally Abused: No    Physically Abused: No    Sexually Abused: No    FAMILY HISTORY: Family History  Problem Relation Age of Onset   Heart attack Mother    Diabetes Mother    Breast cancer Mother    Heart attack Maternal Grandfather    Pancreatic cancer Brother    Colon cancer Neg Hx    Esophageal cancer Neg Hx    Stomach cancer Neg Hx  ALLERGIES:  is allergic to indomethacin and lactose intolerance (gi).  MEDICATIONS:  Current Outpatient Medications  Medication Sig Dispense Refill   acetaminophen (TYLENOL) 650 MG CR tablet Take 1 tablet (650 mg total) by mouth every 8 (eight) hours as needed for pain or fever.     Artificial Tear Solution (SOOTHE XP) SOLN Place 1 drop into both eyes 2 (two) times daily.     aspirin EC 325 MG EC tablet Take 1 tablet (325 mg total) by mouth daily. 30 tablet 1   B Complex-C (B-COMPLEX WITH VITAMIN C) tablet Take 1 tablet by mouth daily.      Biotin 1 MG CAPS Take 1 capsule by mouth daily.      Biotin 5000 MCG TABS Take 1 tablet by mouth daily.     Calcium Citrate 250 MG TABS Take 1 tablet by mouth daily.     cholecalciferol (VITAMIN D) 1000 units tablet Take 1,000 Units by mouth daily.     citalopram (CELEXA) 20 MG tablet Take 20 mg by mouth daily.      clopidogrel (PLAVIX) 75 MG tablet Take 1 tablet (75 mg total) by mouth daily. 30 tablet 1   ferrous sulfate 325 (65 FE) MG tablet Take 1 tablet (325 mg total) by mouth daily with breakfast. 30 tablet 0   folic acid (FOLVITE) 1 MG tablet Take 1 mg by mouth in the morning, at noon, and at bedtime.     ipratropium (ATROVENT) 0.06 % nasal spray Place 2 sprays into both nostrils 2 (two) times daily as needed (allergies).     methotrexate (RHEUMATREX) 2.5 MG tablet Take 20 mg by mouth once a week. TAKE 8 TABLETS (20 MG)  BY MOUTH ONCE A WEEK.     Multiple Vitamins-Minerals (PRESERVISION AREDS 2 PO) Take 1 tablet by mouth in the morning and at bedtime.     pantoprazole (PROTONIX) 40 MG tablet Take 1 tablet (40 mg total) by mouth daily. 30 tablet 1   rosuvastatin (CRESTOR) 20 MG tablet Take 1 tablet (20 mg total) by mouth at bedtime. 30 tablet 1   Wheat Dextrin (BENEFIBER) POWD Take 1 Scoop by mouth daily.     No current facility-administered medications for this visit.    REVIEW OF SYSTEMS:   Constitutional: ( - ) fevers, ( - )  chills , ( - ) night sweats Eyes: ( - ) blurriness of vision, ( - ) double vision, ( - ) watery eyes Ears, nose, mouth, throat, and face: ( - ) mucositis, ( - ) sore throat Respiratory: ( - ) cough, ( - ) dyspnea, ( - ) wheezes Cardiovascular: ( - ) palpitation, ( - ) chest discomfort, ( - ) lower extremity swelling Gastrointestinal:  ( - ) nausea, ( - ) heartburn, ( - ) change in bowel habits Skin: ( - ) abnormal skin rashes Lymphatics: ( - ) new lymphadenopathy, ( - ) easy bruising Neurological: ( - ) numbness, ( - ) tingling, ( - ) new weaknesses Behavioral/Psych: ( - ) mood change, ( - ) new changes  All other systems were reviewed with the patient and are negative.  PHYSICAL EXAMINATION: ECOG PERFORMANCE STATUS: 1 - Symptomatic but completely ambulatory  Vitals:   09/25/21 1440  BP: (!) 111/58  Pulse: 73  Resp: 17  Temp: 97.8 F (36.6 C)  SpO2: 100%   Filed Weights   09/25/21 1440  Weight: 142 lb 1.6 oz (64.5 kg)    GENERAL: well appearing elderly Caucasian  male in NAD  SKIN: skin color, texture, turgor are normal, no rashes or significant lesions EYES: conjunctiva are pink and non-injected, sclera clear LUNGS: clear to auscultation and percussion with normal breathing effort HEART: regular rate & rhythm and no murmurs and no lower extremity edema ABDOMEN: soft, non-tender, non-distended, normal bowel sounds. No HSM appreciated. Musculoskeletal: no cyanosis of  digits and no clubbing  PSYCH: alert & oriented x 3, fluent speech NEURO: no focal motor/sensory deficits  LABORATORY DATA:  I have reviewed the data as listed    Latest Ref Rng & Units 09/25/2021    2:17 PM 09/01/2021    1:58 AM 06/06/2021    6:11 AM  CBC  WBC 4.0 - 10.5 K/uL 7.1  4.6  4.4   Hemoglobin 13.0 - 17.0 g/dL 8.2  8.6  9.8   Hematocrit 39.0 - 52.0 % 26.2  28.7  29.6   Platelets 150 - 400 K/uL 330  249  183        Latest Ref Rng & Units 09/25/2021    2:17 PM 09/01/2021    1:58 AM 06/06/2021    6:11 AM  CMP  Glucose 70 - 99 mg/dL 155  141  122   BUN 8 - 23 mg/dL '19  10  10   '$ Creatinine 0.61 - 1.24 mg/dL 0.97  0.74  0.60   Sodium 135 - 145 mmol/L 137  138  139   Potassium 3.5 - 5.1 mmol/L 4.4  4.2  3.5   Chloride 98 - 111 mmol/L 104  107  111   CO2 22 - 32 mmol/L '29  25  23   '$ Calcium 8.9 - 10.3 mg/dL 9.3  8.5  8.3   Total Protein 6.5 - 8.1 g/dL 7.3   6.2   Total Bilirubin 0.3 - 1.2 mg/dL 0.3   0.2   Alkaline Phos 38 - 126 U/L 52   36   AST 15 - 41 U/L 12   27   ALT 0 - 44 U/L 8   17     No results found for: "MPROTEIN" Lab Results  Component Value Date   KPAFRELGTCHN 16.5 06/14/2019   LAMBDASER 14.3 06/14/2019   KAPLAMBRATIO 1.15 06/14/2019    RADIOGRAPHIC STUDIES: CT HEAD WO CONTRAST  Result Date: 09/01/2021 CLINICAL DATA:  Fall.  Head trauma. EXAM: CT HEAD WITHOUT CONTRAST TECHNIQUE: Contiguous axial images were obtained from the base of the skull through the vertex without intravenous contrast. RADIATION DOSE REDUCTION: This exam was performed according to the departmental dose-optimization program which includes automated exposure control, adjustment of the mA and/or kV according to patient size and/or use of iterative reconstruction technique. COMPARISON:  Head CT dated 06/03/2021. FINDINGS: Brain: Mild age-related atrophy and chronic microvascular ischemic changes. There is no acute intracranial hemorrhage. No mass effect or midline shift. No extra-axial  fluid collection. Vascular: No hyperdense vessel or unexpected calcification. Skull: Normal. Negative for fracture or focal lesion. Sinuses/Orbits: No acute finding. Other: None IMPRESSION: 1. No acute intracranial pathology. 2. Mild age-related atrophy and chronic microvascular ischemic changes. Electronically Signed   By: Anner Crete M.D.   On: 09/01/2021 02:46    Azure 87 y.o. male with medical history significant for iron deficiency anemia who presents for a follow up visit.  After review of the labs and discussion with the patient the findings are most consistent with continued iron deficiency anemia.  GI evaluations: EGD 03/11/2017: normal stomach and esophagus. Mild duodenal  stenosis Colonoscopy 11/17/2013: moderate diverticulosis  At this time there is concern for recurrent GI bleed.  His hemoglobin has trended downward from 11.4 on 06/03/2021 to 8.2 today 09/25/2021.  GI loss is the most likely etiology.  He is currently undergoing stool card testing with his PCP.  If this is positive would recommend prompt referral to GI.  We will recheck his iron levels today and if persistently low will pursue IV iron therapy.  #Normocytic Anemia -- Today hemoglobin stable at 8.2, MCV 83.2, with white blood cell count of 7.1 and platelets of 330 --previously patient has to stop his ferrous sulfate '325mg'$  PO daily due to recurrent SBOs, as recommended by his surgical team.  --Patient has restarted his ferrous sulfate 325 mg p.o. daily and is tolerating it well. --repeat Iron panel, ferritin, and reticulocyte panel today.  --will recommend IV iron to the patient if iron levels remain low  --Steven [name] is currently repeating FOB card with his PCP.  Would recommend re-referral to GI in the event of a positive test --RTC in 4-6 weeks after last dose of IV iron   No orders of the defined types were placed in this encounter.  All questions were answered. The patient knows to call  the clinic with any problems, questions or concerns.  A total of more than 30 minutes were spent on this encounter and over half of that time was spent on counseling and coordination of care as outlined above.   Ledell Peoples, MD Department of Hematology/Oncology [place] [facility] Phone: [phone] Pager: [phone] Email: [email or web address]  09/25/2021 4:27 PM

## 2021-09-25 NOTE — Telephone Encounter (Signed)
Per 6/15 los called and left message about appointment in 8 weeks.  Details and call back number were left

## 2021-09-26 ENCOUNTER — Ambulatory Visit: Payer: Medicare Other | Admitting: Family Medicine

## 2021-09-26 VITALS — BP 132/58 | HR 65 | Ht 68.0 in | Wt 142.8 lb

## 2021-09-26 DIAGNOSIS — G8929 Other chronic pain: Secondary | ICD-10-CM | POA: Diagnosis not present

## 2021-09-26 DIAGNOSIS — R29898 Other symptoms and signs involving the musculoskeletal system: Secondary | ICD-10-CM

## 2021-09-26 DIAGNOSIS — M25551 Pain in right hip: Secondary | ICD-10-CM | POA: Diagnosis not present

## 2021-09-26 LAB — FERRITIN: Ferritin: 7 ng/mL — ABNORMAL LOW (ref 24–336)

## 2021-09-26 NOTE — Patient Instructions (Addendum)
Thank you for coming in today.   You should hear from MRI scheduling within 1 week. If you do not hear please let me know.    Check back after the MRI

## 2021-09-26 NOTE — Progress Notes (Incomplete)
Steven [name] is a 87 y.o. male who presents to [place] at [facility] today for weakness in bilat leg. Pt reports he can barely walk 50 yards and are quick to tire. Pt c/o pain in the posterior aspect of the R hip, which he's had replaced.  Patient has a relevant pertinent medical history for significant iron deficiency anemia.  He is currently being evaluated by hematology for anemia thought strongly to be iron deficiency related secondary to blood loss.  He did have a fall with bleeding recently which is thought to be a factor here however the majority source of his iron deficiency is thought to be GI related blood loss.  Per his most recent GI note he is in the process of having a Hemoccult stool card by PCP and with a plan to refer to GI from there.  Additionally per his most recent hematology note if the iron deficiency remains despite oral iron repletion there is a plan to proceed to the IV iron.  Treatments tried: HEP per PT  Pertinent review of systems: No fevers or chills.  Positive fatigue and weakness.    Relevant historical information: Significant iron deficiency anemia.  Positive for bowel obstruction failure of a hip replacement SIRS TIA coronary artery disease.  History of a fall.  History of alcohol use.   Exam:  BP (!) 132/58   Pulse 65   Ht '5\' 8"'$  (1.727 m)   Wt 142 lb 12.8 oz (64.8 kg)   SpO2 98%   BMI 21.71 kg/m  General: Well Developed, well nourished, and in no acute distress.   MSK: L-spine: Nontender midline.  Decreased lumbar motion Decreased lower extremity strength right hip flexion and abduction 4/5 compared to left.  Otherwise lower extremity strength is intact throughout upper Reflexes are intact. Normal gait.    Lab and Radiology Results Lab Results  Component Value Date   WBC 7.1 09/25/2021   HGB 8.2 (L) 09/25/2021   HCT 26.2 (L) 09/25/2021   MCV 83.2 09/25/2021   PLT 330 09/25/2021   Lab Results  Component Value Date    IRON 9 (L) 09/25/2021   TIBC 463 (H) 09/25/2021   FERRITIN 7 (L) 09/25/2021   Component     Latest Ref Rng 09/25/2021  Retic Ct Pct     0.4 - 3.1 % 0.8   RBC.     4.22 - 5.81 MIL/uL 3.13 (L)   Retic Count, Absolute     19.0 - 186.0 K/uL 23.5   Immature Retic Fract     2.3 - 15.9 % 23.6 (H)   Reticulocyte Hemoglobin     >27.9 pg 27.6 (L)     Legend: (L) Low (H) High      Chemistry      Component Value Date/Time   NA 137 09/25/2021 1417   NA 141 04/20/2012 0425   K 4.4 09/25/2021 1417   K 4.1 04/20/2012 0425   CL 104 09/25/2021 1417   CL 109 (H) 04/20/2012 0425   CO2 29 09/25/2021 1417   CO2 28 04/20/2012 0425   BUN 19 09/25/2021 1417   BUN 14 04/20/2012 0425   CREATININE 0.97 09/25/2021 1417   CREATININE 0.95 04/20/2012 0425      Component Value Date/Time   CALCIUM 9.3 09/25/2021 1417   CALCIUM 8.2 (L) 04/20/2012 0425   ALKPHOS 52 09/25/2021 1417   ALKPHOS 40 (L) 04/20/2012 0425   AST 12 (L) 09/25/2021 1417  ALT 8 09/25/2021 1417   ALT 41 04/20/2012 0425   BILITOT 0.3 09/25/2021 1417      Lab Results  Component Value Date   TSH 1.417 06/03/2021    Images of lumbar spine visible on CT scan abdomen and pelvis obtained on February 28, 2020 show significant multilevel lumbar degenerative ankylosing spondylitis.  Images personally independently interpreted.    Assessment and Plan: 87 y.o. male with fatigue and lower extremity weakness. Etiology is multifactorial.  I think the majority source of his weakness and fatigue is due to iron deficiency anemia.  His hemoglobin is now 8.2 and his iron stores are extremely well.  Based his hematology notes I think the plan is to proceed to IV iron which I think will help.  However I think there may be also a component of spinal stenosis or nerve impingement causing some leg weakness.  I will proceed to lumbar spine MRI to further evaluate potential for nerve impingement causing weakness.  Recheck after MRI.   Anticipate IV iron with PCP.   PDMP not reviewed this encounter. Orders Placed This Encounter  Procedures   MR LUMBAR SPINE WO CONTRAST    Standing Status:   Future    Standing Expiration Date:   10/26/2021    Order Specific Question:   What is the patient's sedation requirement?    Answer:   No Sedation    Order Specific Question:   Does the patient have a pacemaker or implanted devices?    Answer:   No    Order Specific Question:   Preferred imaging location?    Answer:   Product/process development scientist (table limit-350lbs)   No orders of the defined types were placed in this encounter.    Discussed warning signs or symptoms. Please see discharge instructions. Patient expresses understanding.   ***

## 2021-09-29 ENCOUNTER — Telehealth: Payer: Self-pay | Admitting: Pharmacy Technician

## 2021-09-29 DIAGNOSIS — D649 Anemia, unspecified: Secondary | ICD-10-CM | POA: Diagnosis not present

## 2021-09-29 NOTE — Telephone Encounter (Signed)
Auth Submission: Payer: [employer] Medicare Medication & CPT/J Code(s) submitted: Monoferric Route of submission (phone, fax, portal): Portal Auth type: Buy/Bill Units/visits requested: 1 visit , '1000mg'$  Reference number:  Approval from:  to  at [facility] as Buy/Bill   Will update once we receive a response.

## 2021-09-29 NOTE — Telephone Encounter (Signed)
Ramona reference # [id number]

## 2021-09-30 ENCOUNTER — Encounter: Payer: Self-pay | Admitting: Hematology and Oncology

## 2021-09-30 NOTE — Telephone Encounter (Signed)
Auth Submission: no auth needed Payer: [employer] Medicare Medication & CPT/J Code(s) submitted: Feraheme (ferumoxytol) [id number] Route of submission (phone, fax, portal): Portal Auth type: Buy/Bill Units/visits requested: 2 visits  Patient will be scheduled asap.

## 2021-10-01 ENCOUNTER — Ambulatory Visit (INDEPENDENT_AMBULATORY_CARE_PROVIDER_SITE_OTHER): Payer: Medicare Other

## 2021-10-01 VITALS — BP 126/66 | HR 72 | Temp 98.2°F | Resp 16 | Ht 68.0 in | Wt 142.2 lb

## 2021-10-01 DIAGNOSIS — D500 Iron deficiency anemia secondary to blood loss (chronic): Secondary | ICD-10-CM

## 2021-10-01 MED ORDER — SODIUM CHLORIDE 0.9 % IV SOLN
510.0000 mg | Freq: Once | INTRAVENOUS | Status: AC
  Administered 2021-10-01: 510 mg via INTRAVENOUS
  Filled 2021-10-01: qty 17

## 2021-10-01 NOTE — Progress Notes (Signed)
Diagnosis: Iron Deficiency Anemia  Provider:  Marshell Garfinkel, MD  Procedure: Infusion  IV Type: Peripheral, IV Location: L Antecubital  Feraheme (Ferumoxytol), Dose: 510 mg  Infusion Start Time: 4944  Infusion Stop Time: 1030  Post Infusion IV Care: Peripheral IV Discontinued  Discharge: Condition: Good, Destination: Home . AVS provided to patient.   Performed by:  Adelina Mings, LPN

## 2021-10-08 DIAGNOSIS — R194 Change in bowel habit: Secondary | ICD-10-CM | POA: Diagnosis not present

## 2021-10-08 DIAGNOSIS — D509 Iron deficiency anemia, unspecified: Secondary | ICD-10-CM | POA: Diagnosis not present

## 2021-10-08 DIAGNOSIS — A048 Other specified bacterial intestinal infections: Secondary | ICD-10-CM | POA: Diagnosis not present

## 2021-10-10 ENCOUNTER — Ambulatory Visit (INDEPENDENT_AMBULATORY_CARE_PROVIDER_SITE_OTHER): Payer: Medicare Other

## 2021-10-10 VITALS — BP 113/61 | HR 55 | Temp 98.1°F | Resp 16 | Ht 67.0 in | Wt 140.6 lb

## 2021-10-10 DIAGNOSIS — E785 Hyperlipidemia, unspecified: Secondary | ICD-10-CM | POA: Diagnosis not present

## 2021-10-10 DIAGNOSIS — D500 Iron deficiency anemia secondary to blood loss (chronic): Secondary | ICD-10-CM

## 2021-10-10 DIAGNOSIS — E118 Type 2 diabetes mellitus with unspecified complications: Secondary | ICD-10-CM | POA: Diagnosis not present

## 2021-10-10 DIAGNOSIS — K5900 Constipation, unspecified: Secondary | ICD-10-CM | POA: Diagnosis not present

## 2021-10-10 DIAGNOSIS — D649 Anemia, unspecified: Secondary | ICD-10-CM | POA: Diagnosis not present

## 2021-10-10 MED ORDER — SODIUM CHLORIDE 0.9 % IV SOLN
510.0000 mg | Freq: Once | INTRAVENOUS | Status: AC
  Administered 2021-10-10: 510 mg via INTRAVENOUS
  Filled 2021-10-10: qty 17

## 2021-10-10 NOTE — Progress Notes (Signed)
Diagnosis: Iron Deficiency Anemia  Provider:  Marshell Garfinkel, MD  Procedure: Infusion  IV Type: Peripheral, IV Location: L Antecubital  Feraheme (Ferumoxytol), Dose: 510 mg  Infusion Start Time: 7494  Infusion Stop Time: 4967  Post Infusion IV Care: Peripheral IV Discontinued  Discharge: Condition: Good, Destination: Home . AVS provided to patient.   Performed by:  Adelina Mings, LPN

## 2021-10-11 ENCOUNTER — Other Ambulatory Visit: Payer: Medicare Other

## 2021-10-12 ENCOUNTER — Other Ambulatory Visit: Payer: Medicare Other

## 2021-10-13 ENCOUNTER — Ambulatory Visit (INDEPENDENT_AMBULATORY_CARE_PROVIDER_SITE_OTHER): Payer: Medicare Other

## 2021-10-13 DIAGNOSIS — R531 Weakness: Secondary | ICD-10-CM | POA: Diagnosis not present

## 2021-10-13 DIAGNOSIS — R29898 Other symptoms and signs involving the musculoskeletal system: Secondary | ICD-10-CM

## 2021-10-13 DIAGNOSIS — M5127 Other intervertebral disc displacement, lumbosacral region: Secondary | ICD-10-CM | POA: Diagnosis not present

## 2021-10-13 DIAGNOSIS — N289 Disorder of kidney and ureter, unspecified: Secondary | ICD-10-CM | POA: Diagnosis not present

## 2021-10-13 DIAGNOSIS — M4807 Spinal stenosis, lumbosacral region: Secondary | ICD-10-CM | POA: Diagnosis not present

## 2021-10-20 DIAGNOSIS — D509 Iron deficiency anemia, unspecified: Secondary | ICD-10-CM | POA: Diagnosis not present

## 2021-10-20 NOTE — Progress Notes (Signed)
Lumbar spine MRI does show mildly pinched nerves at L5 and S1 that could potentially cause some leg weakness.  I think if you do not improve with IV iron we should proceed with back injections.  It looks like you did get IV iron on the 30th.  How are you feeling now?

## 2021-10-21 DIAGNOSIS — I951 Orthostatic hypotension: Secondary | ICD-10-CM | POA: Diagnosis not present

## 2021-10-21 DIAGNOSIS — I959 Hypotension, unspecified: Secondary | ICD-10-CM | POA: Diagnosis not present

## 2021-11-04 DIAGNOSIS — I951 Orthostatic hypotension: Secondary | ICD-10-CM | POA: Diagnosis not present

## 2021-11-07 ENCOUNTER — Ambulatory Visit (INDEPENDENT_AMBULATORY_CARE_PROVIDER_SITE_OTHER): Payer: Medicare Other | Admitting: Family Medicine

## 2021-11-07 VITALS — BP 122/58 | HR 62 | Ht 67.0 in | Wt 139.6 lb

## 2021-11-07 DIAGNOSIS — M457 Ankylosing spondylitis of lumbosacral region: Secondary | ICD-10-CM

## 2021-11-07 DIAGNOSIS — R531 Weakness: Secondary | ICD-10-CM | POA: Diagnosis not present

## 2021-11-07 DIAGNOSIS — D500 Iron deficiency anemia secondary to blood loss (chronic): Secondary | ICD-10-CM

## 2021-11-07 NOTE — Patient Instructions (Addendum)
Thank you for coming in today.   Glad you are feeling better.  Check back as needed

## 2021-11-07 NOTE — Progress Notes (Signed)
I, Peterson Lombard, LAT, ATC acting as a scribe for Lynne Leader, MD.  Steven [name] is a 88 y.o. male who presents to [place] at [facility] today for bilat LE weakness and iron deficiency anemia. Pt was last seen by Dr. Georgina Snell on 09/26/21 and was advised to proceed to MRI and f/u w/ PCP on IV iron. Today, pt reports he is feeling better. Pt note his lower legs are not feeling weak and he's feeling better than he has in long time.   Dx imaging: 10/13/21 L-spine MRI  Pertinent review of systems: No fevers or chills  Relevant historical information: Small bowel obstruction.  Ankylosing spondylitis.   Exam:  BP (!) 122/58   Pulse 62   Ht '5\' 7"'$  (1.702 m)   Wt 139 lb 9.6 oz (63.3 kg)   SpO2 98%   BMI 21.86 kg/m  General: Well Developed, well nourished, and in no acute distress.   MSK: Increased lumbar motion. Lower extremity strength is intact.    Lab and Radiology Results EXAM: MRI LUMBAR SPINE WITHOUT CONTRAST   TECHNIQUE: Multiplanar, multisequence MR imaging of the lumbar spine was performed. No intravenous contrast was administered.   COMPARISON:  CT abdomen/pelvis 02/28/2020, lumbar spine MRI 10/15/2016   FINDINGS: Segmentation: Standard; the lowest formed disc space is designated L5-S1.   Alignment:  Normal.   Vertebrae: There is diffuse ankylosis of the vertebral bodies and posterior elements as well as the SI joints consistent with ankylosing spondylitis. There is also osseous fusion across the T11-T12 disc space. Marrow signal is diffusely heterogeneous and mildly T1 hypointense with patchy T1 and T2 hyperintensity in the T11 and T12 vertebral bodies. There is no marrow edema or evidence of acute injury.   Conus medullaris and cauda equina: Conus extends to the T12-L1 level. Conus and cauda equina appear normal.   Paraspinal and other soft tissues: Small T2 hyperintense lesions in the kidneys likely reflect benign cysts for which no  specific imaging follow-up is required. Paraspinal soft tissues are unremarkable.   Disc levels:   T11-T12: Mild degenerative endplate spurring without significant spinal canal or neural foraminal stenosis   T12-L1: No significant spinal canal or neural foraminal stenosis   L1-L2: Mild facet arthropathy without significant spinal canal or neural foraminal stenosis   L2-L3: Mild facet arthropathy without significant spinal canal or neural foraminal stenosis   L3-L4: Mild facet arthropathy and degenerative endplate change without significant spinal canal or neural foraminal stenosis   L4-L5: Mild-to-moderate bilateral facet arthropathy and degenerative endplate change resulting in mild left worse than right neural foraminal stenosis without significant spinal canal stenosis   L5-S1: There is a mild disc bulge and moderate bilateral facet arthropathy resulting in mild-to-moderate left and mild right neural foraminal stenosis without significant spinal canal stenosis.   IMPRESSION: 1. Findings consistent with ankylosing spondylitis, unchanged. No evidence of acute injury. 2. Overall mild degenerative changes with up to mild-to-moderate left and mild right neural foraminal stenosis at L5-S1. No significant spinal canal stenosis at any level.     Electronically Signed   By: Valetta Mole M.D.   On: 10/15/2021 10:44  I, Lynne Leader, personally (independently) visualized and performed the interpretation of the images attached in this note.   Lab Results  Component Value Date   WBC 7.1 09/25/2021   HGB 8.2 (L) 09/25/2021   HCT 26.2 (L) 09/25/2021   MCV 83.2 09/25/2021   PLT 330 09/25/2021   Lab Results  Component  Value Date   IRON 9 (L) 09/25/2021   TIBC 463 (H) 09/25/2021   FERRITIN 7 (L) 09/25/2021       Assessment and Plan: 87 y.o. male with leg weakness.  This has improved tremendously with repletion of iron with IV iron infusion.  He also is using oral iron as  well which was helpful.  He does have some abnormalities on his lumbar MRI but I do not think they are causing his symptoms.  He has improved dramatically with iron infusion.  For now watchful waiting and check back with me as needed.  Hematology is treating his anemia which seems to be the main problem.   Total encounter time 20 minutes including face-to-face time with the patient and, reviewing past medical record, and charting on the date of service.   Reviewed MRI findings treatment plan and options.    Discussed warning signs or symptoms. Please see discharge instructions. Patient expresses understanding.   The above documentation has been reviewed and is accurate and complete Lynne Leader, M.D.

## 2021-11-18 DIAGNOSIS — R3915 Urgency of urination: Secondary | ICD-10-CM | POA: Diagnosis not present

## 2021-11-19 ENCOUNTER — Other Ambulatory Visit: Payer: Self-pay | Admitting: Hematology and Oncology

## 2021-11-19 DIAGNOSIS — D500 Iron deficiency anemia secondary to blood loss (chronic): Secondary | ICD-10-CM

## 2021-11-20 ENCOUNTER — Telehealth: Payer: Self-pay | Admitting: Hematology and Oncology

## 2021-11-20 ENCOUNTER — Inpatient Hospital Stay: Payer: Medicare Other | Admitting: Hematology and Oncology

## 2021-11-20 ENCOUNTER — Inpatient Hospital Stay: Payer: Medicare Other | Attending: Hematology and Oncology

## 2021-11-20 ENCOUNTER — Other Ambulatory Visit: Payer: Self-pay

## 2021-11-20 VITALS — BP 111/75 | HR 61 | Temp 97.6°F | Resp 16 | Wt 136.3 lb

## 2021-11-20 DIAGNOSIS — Z87442 Personal history of urinary calculi: Secondary | ICD-10-CM | POA: Insufficient documentation

## 2021-11-20 DIAGNOSIS — Z7982 Long term (current) use of aspirin: Secondary | ICD-10-CM | POA: Diagnosis not present

## 2021-11-20 DIAGNOSIS — Z803 Family history of malignant neoplasm of breast: Secondary | ICD-10-CM | POA: Insufficient documentation

## 2021-11-20 DIAGNOSIS — D509 Iron deficiency anemia, unspecified: Secondary | ICD-10-CM | POA: Insufficient documentation

## 2021-11-20 DIAGNOSIS — D649 Anemia, unspecified: Secondary | ICD-10-CM | POA: Diagnosis not present

## 2021-11-20 DIAGNOSIS — Z79899 Other long term (current) drug therapy: Secondary | ICD-10-CM | POA: Diagnosis not present

## 2021-11-20 DIAGNOSIS — D500 Iron deficiency anemia secondary to blood loss (chronic): Secondary | ICD-10-CM | POA: Diagnosis not present

## 2021-11-20 DIAGNOSIS — Z7902 Long term (current) use of antithrombotics/antiplatelets: Secondary | ICD-10-CM | POA: Insufficient documentation

## 2021-11-20 DIAGNOSIS — E785 Hyperlipidemia, unspecified: Secondary | ICD-10-CM | POA: Insufficient documentation

## 2021-11-20 LAB — CBC WITH DIFFERENTIAL (CANCER CENTER ONLY)
Abs Immature Granulocytes: 0.01 10*3/uL (ref 0.00–0.07)
Basophils Absolute: 0 10*3/uL (ref 0.0–0.1)
Basophils Relative: 1 %
Eosinophils Absolute: 0.3 10*3/uL (ref 0.0–0.5)
Eosinophils Relative: 6 %
HCT: 35.5 % — ABNORMAL LOW (ref 39.0–52.0)
Hemoglobin: 11.7 g/dL — ABNORMAL LOW (ref 13.0–17.0)
Immature Granulocytes: 0 %
Lymphocytes Relative: 20 %
Lymphs Abs: 1 10*3/uL (ref 0.7–4.0)
MCH: 29.2 pg (ref 26.0–34.0)
MCHC: 33 g/dL (ref 30.0–36.0)
MCV: 88.5 fL (ref 80.0–100.0)
Monocytes Absolute: 0.4 10*3/uL (ref 0.1–1.0)
Monocytes Relative: 8 %
Neutro Abs: 3.1 10*3/uL (ref 1.7–7.7)
Neutrophils Relative %: 65 %
Platelet Count: 273 10*3/uL (ref 150–400)
RBC: 4.01 MIL/uL — ABNORMAL LOW (ref 4.22–5.81)
RDW: 21.6 % — ABNORMAL HIGH (ref 11.5–15.5)
WBC Count: 4.7 10*3/uL (ref 4.0–10.5)
nRBC: 0 % (ref 0.0–0.2)

## 2021-11-20 LAB — CMP (CANCER CENTER ONLY)
ALT: 17 U/L (ref 0–44)
AST: 16 U/L (ref 15–41)
Albumin: 4.2 g/dL (ref 3.5–5.0)
Alkaline Phosphatase: 48 U/L (ref 38–126)
Anion gap: 2 — ABNORMAL LOW (ref 5–15)
BUN: 12 mg/dL (ref 8–23)
CO2: 32 mmol/L (ref 22–32)
Calcium: 9.2 mg/dL (ref 8.9–10.3)
Chloride: 103 mmol/L (ref 98–111)
Creatinine: 0.73 mg/dL (ref 0.61–1.24)
GFR, Estimated: >60 mL/min (ref >60)
Glucose, Bld: 129 mg/dL — ABNORMAL HIGH (ref 70–99)
Potassium: 3.8 mmol/L (ref 3.5–5.1)
Sodium: 137 mmol/L (ref 135–145)
Total Bilirubin: 0.7 mg/dL (ref 0.3–1.2)
Total Protein: 7.6 g/dL (ref 6.5–8.1)

## 2021-11-20 LAB — RETIC PANEL
Immature Retic Fract: 10.1 % (ref 2.3–15.9)
RBC.: 4 MIL/uL — ABNORMAL LOW (ref 4.22–5.81)
Retic Count, Absolute: 24 10*3/uL (ref 19.0–186.0)
Retic Ct Pct: 0.6 % (ref 0.4–3.1)
Reticulocyte Hemoglobin: 35.6 pg (ref >27.9)

## 2021-11-20 LAB — IRON AND IRON BINDING CAPACITY (CC-WL,HP ONLY)
Iron: 120 ug/dL (ref 45–182)
Saturation Ratios: 40 % — ABNORMAL HIGH (ref 17.9–39.5)
TIBC: 300 ug/dL (ref 250–450)
UIBC: 180 ug/dL (ref 117–376)

## 2021-11-20 LAB — FERRITIN: Ferritin: 122 ng/mL (ref 24–336)

## 2021-11-20 NOTE — Telephone Encounter (Signed)
Per 8/10 los called and spoke to pt about appointment  pt confirmed appointment

## 2021-11-20 NOTE — Progress Notes (Signed)
[place] [phone]   [phone]  PROGRESS NOTE  Patient Care Team: [name], MD as PCP - General (Internal Medicine) Orson Slick, MD as Consulting Physician (Hematology and Oncology)  Hematological/Oncological History #Normocytic Anemia #Iron Deficiency Anemia 1) 01/01/2017: WBC 5.1, Hgb 11.0, Plt 254, MCV 91.6 2) 12/07/2017: WBC 8.0, Hgb 11.9, Plt 352, MCV 91.4. Iron 76, TIBC 359, Sat 21%, Ferritin 31 3) 03/09/2019: WBC 14.3, Hgb 11.1, Plt 374, MCV 88.8 4) 05/22/2019: WBC 5.5, Hgb 10.7, Plt 311, MCV 83.7. Routine PCP visit. 5) 06/14/2019: establish care with Dr. Lorenso Courier. WBC 5.0, Hgb 10.3, MCV 84.9, Plt 320 6) 09/14/2019: WBC 6.2, Hgb 10.9, MCV 91.2, Plt 209 7)  11/01/2019: WBC 9.2, Hgb 12.6, MVC 97.2, Plt 299 8) 11/05/2019: patient admitted with SBO, instructed to stop PO iron indefinitely.  9) 02/28/2020: admitted with SBO, Hgb drop to 9.6 while inpatient 10) 05/17/2020: Hgb 11.7, increased without intervention.  11) 09/05/2020: WBC 6.6, Hgb 10.9, MCV 94.6, Plt 259 12) 6/6-6/17/2022: IV feraheme 510 mg x 2 doses  13) 6/21-6/30/2023: IV feraheme 510 mg x 2 doses   Interval History:  Steven [name] 87 y.o. male with medical history significant for iron deficiency anemia who presents for a follow up visit. The patient's last visit was on 09/25/2021. In the interim since the last visit Steven [name] received  IV feraheme 510 mg x 2 doses in June 2023.   On exam today Steven [name] notes he has been well overall interim since her last visit.  He reports he tolerated the IV iron infusions quite well with no major side effects.  He reports overall feels fine.  He notes his energy levels are currently an 8 out of 10.  He notes that had further FOB cards tested which were negative.  He was referred to gastroenterology and it was noted that he cannot undergo any colonoscopy scopic intervention.  He is not having any trouble with shortness of breath or chest pain.   He is taking iron pills faithfully and is not causing any stomach upset, constipation, or pain.  He continues to deny any overt sources of bleeding.  He denies having fevers, chills, sweats, nausea, vomiting or diarrhea.  Otherwise a full 10 point ROS is listed below.   MEDICAL HISTORY:  Past Medical History:  Diagnosis Date   Allergy    enviromental   Anemia 2017   Anxiety    Arthritis    ankylosing spodilitis   Blood transfusion without reported diagnosis    during surgery   Cataract    Depression    Dyspnea    with exertion - had Echo done 09/29/16   History of kidney stones    Hyperlipidemia    Intestinal obstruction ([place])    Pneumonia    as a child    SURGICAL HISTORY: Past Surgical History:  Procedure Laterality Date   ABDOMINAL SURGERY     had abcess   APPENDECTOMY     CHOLECYSTECTOMY     with lysis of adhesions   COLONOSCOPY     COLOSTOMY     COLOSTOMY REVERSAL     EYE SURGERY Left    scar tissue removed from cornea   EYE SURGERY Right    cataract surgery with lens implant   JOINT REPLACEMENT Right    hip  x 2 1999 and 2007   SHOULDER ARTHROSCOPY WITH ROTATOR CUFF REPAIR AND SUBACROMIAL DECOMPRESSION Left 03/02/2013   Procedure: LEFT SHOULDER ARTHROSCOPY  WITH SUBACROMIAL DECOMPRESSION DISTAL CALVICLE RESECTION AND ROTATOR CUFF REPAIR ;  Surgeon: [name], MD;  Location: [facility];  Service: Orthopedics;  Laterality: Left;   TOTAL HIP REVISION Right 11/06/2016   Procedure: TOTAL HIP REVISION OF THE ACETABULAR COMPONENT;  Surgeon: [name], MD;  Location: [facility];  Service: Orthopedics;  Laterality: Right;   TRANSCAROTID ARTERY REVASCULARIZATION  Left 03/31/2021   Procedure: TRANSCAROTID ARTERY REVASCULARIZATION;  Surgeon: Broadus , MD;  Location: [facility] OR;  Service: Vascular;  Laterality: Left;   ULTRASOUND GUIDANCE FOR VASCULAR ACCESS Right 03/31/2021   Procedure: ULTRASOUND GUIDANCE FOR VASCULAR ACCESS;  Surgeon: Broadus , MD;  Location[facility];   Service: Vascular;  Laterality: Right;    SOCIAL HISTORY: Social History   Socioeconomic History   Marital status: Married    Spouse name: Not on file   Number of children: 2   Years of education: Not on file   Highest education level: Not on file  Occupational History   Occupation: retired  Tobacco Use   Smoking status: Former   Smokeless tobacco: Never  Scientific laboratory technician Use: Never used  Substance and Sexual Activity   Alcohol use: Yes    Comment: 3 beers or glasses of wine a day--reports stopped ETOH 11/01/16    Drug use: No   Sexual activity: Never  Other Topics Concern   Not on file  Social History Narrative   Not on file   Social Determinants of Health   Financial Resource Strain: Low Risk  (03/09/2019)   Overall Financial Resource Strain (CARDIA)    Difficulty of Paying Living Expenses: Not hard at all  Food Insecurity: No Food Insecurity (03/09/2019)   Hunger Vital Sign    Worried About Running Out of Food in the Last Year: Never true    [place] in the Last Year: Never true  Transportation Needs: No Transportation Needs (03/09/2019)   PRAPARE - Hydrologist (Medical): No    Lack of Transportation (Non-Medical): No  Physical Activity: Insufficiently Active (03/09/2019)   Exercise Vital Sign    Days of Exercise per Week: 5 days    Minutes of Exercise per Session: 10 min  Stress: No Stress Concern Present (03/09/2019)   [place]    Feeling of Stress : Not at all  Social Connections: Unknown (03/09/2019)   Social Connection and Isolation Panel [NHANES]    Frequency of Communication with Friends and Family: Patient refused    Frequency of Social Gatherings with Friends and Family: Patient refused    Attends Religious Services: Patient refused    Active Member of Clubs or Organizations: Patient refused    Attends Archivist Meetings: Patient  refused    Marital Status: Patient refused  Intimate Partner Violence: Not At Risk (03/09/2019)   Humiliation, Afraid, Rape, and Kick questionnaire    Fear of Current or Ex-Partner: No    Emotionally Abused: No    Physically Abused: No    Sexually Abused: No    FAMILY HISTORY: Family History  Problem Relation Age of Onset   Heart attack Mother    Diabetes Mother    Breast cancer Mother    Heart attack Maternal Grandfather    Pancreatic cancer Brother    Colon cancer Neg Hx    Esophageal cancer Neg Hx    Stomach cancer Neg Hx     ALLERGIES:  is allergic to indomethacin and lactose intolerance (gi).  MEDICATIONS:  Current Outpatient Medications  Medication Sig Dispense Refill   acetaminophen (TYLENOL) 650 MG CR tablet Take 1 tablet (650 mg total) by mouth every 8 (eight) hours as needed for pain or fever.     Artificial Tear Solution (SOOTHE XP) SOLN Place 1 drop into both eyes 2 (two) times daily.     aspirin EC 325 MG EC tablet Take 1 tablet (325 mg total) by mouth daily. 30 tablet 1   B Complex-C (B-COMPLEX WITH VITAMIN C) tablet Take 1 tablet by mouth daily.      Biotin 1 MG CAPS Take 1 capsule by mouth daily.      Biotin 5000 MCG TABS Take 1 tablet by mouth daily.     Calcium Citrate 250 MG TABS Take 1 tablet by mouth daily.     cholecalciferol (VITAMIN D) 1000 units tablet Take 1,000 Units by mouth daily.     citalopram (CELEXA) 20 MG tablet Take 20 mg by mouth daily.      clopidogrel (PLAVIX) 75 MG tablet Take 1 tablet (75 mg total) by mouth daily. 30 tablet 1   ferrous sulfate 325 (65 FE) MG tablet Take 1 tablet (325 mg total) by mouth daily with breakfast. 30 tablet 0   folic acid (FOLVITE) 1 MG tablet Take 1 mg by mouth in the morning, at noon, and at bedtime.     ipratropium (ATROVENT) 0.06 % nasal spray Place 2 sprays into both nostrils 2 (two) times daily as needed (allergies).     methotrexate (RHEUMATREX) 2.5 MG tablet Take 20 mg by mouth once a week. TAKE 8  TABLETS (20 MG) BY MOUTH ONCE A WEEK.     Multiple Vitamins-Minerals (PRESERVISION AREDS 2 PO) Take 1 tablet by mouth in the morning and at bedtime.     pantoprazole (PROTONIX) 40 MG tablet Take 1 tablet (40 mg total) by mouth daily. 30 tablet 1   rosuvastatin (CRESTOR) 20 MG tablet Take 1 tablet (20 mg total) by mouth at bedtime. 30 tablet 1   Wheat Dextrin (BENEFIBER) POWD Take 1 Scoop by mouth daily.     No current facility-administered medications for this visit.    REVIEW OF SYSTEMS:   Constitutional: ( - ) fevers, ( - )  chills , ( - ) night sweats Eyes: ( - ) blurriness of vision, ( - ) double vision, ( - ) watery eyes Ears, nose, mouth, throat, and face: ( - ) mucositis, ( - ) sore throat Respiratory: ( - ) cough, ( - ) dyspnea, ( - ) wheezes Cardiovascular: ( - ) palpitation, ( - ) chest discomfort, ( - ) lower extremity swelling Gastrointestinal:  ( - ) nausea, ( - ) heartburn, ( - ) change in bowel habits Skin: ( - ) abnormal skin rashes Lymphatics: ( - ) new lymphadenopathy, ( - ) easy bruising Neurological: ( - ) numbness, ( - ) tingling, ( - ) new weaknesses Behavioral/Psych: ( - ) mood change, ( - ) new changes  All other systems were reviewed with the patient and are negative.  PHYSICAL EXAMINATION: ECOG PERFORMANCE STATUS: 1 - Symptomatic but completely ambulatory  Vitals:   11/20/21 1027  BP: 111/75  Pulse: 61  Resp: 16  Temp: 97.6 F (36.4 C)  SpO2: 100%   Filed Weights   11/20/21 1027  Weight: 136 lb 4.8 oz (61.8 kg)    GENERAL: well appearing elderly Caucasian male in NAD  SKIN: skin color, texture, turgor are normal, no rashes or significant lesions EYES: conjunctiva are pink and non-injected, sclera clear LUNGS: clear to auscultation and percussion with normal breathing effort HEART: regular rate & rhythm and no murmurs and no lower extremity edema ABDOMEN: soft, non-tender, non-distended, normal bowel sounds. No HSM appreciated. Musculoskeletal: no  cyanosis of digits and no clubbing  PSYCH: alert & oriented x 3, fluent speech NEURO: no focal motor/sensory deficits  LABORATORY DATA:  I have reviewed the data as listed    Latest Ref Rng & Units 11/20/2021    9:59 AM 09/25/2021    2:17 PM 09/01/2021    1:58 AM  CBC  WBC 4.0 - 10.5 K/uL 4.7  7.1  4.6   Hemoglobin 13.0 - 17.0 g/dL 11.7  8.2  8.6   Hematocrit 39.0 - 52.0 % 35.5  26.2  28.7   Platelets 150 - 400 K/uL 273  330  249        Latest Ref Rng & Units 11/20/2021    9:59 AM 09/25/2021    2:17 PM 09/01/2021    1:58 AM  CMP  Glucose 70 - 99 mg/dL 129  155  141   BUN 8 - 23 mg/dL '12  19  10   '$ Creatinine 0.61 - 1.24 mg/dL 0.73  0.97  0.74   Sodium 135 - 145 mmol/L 137  137  138   Potassium 3.5 - 5.1 mmol/L 3.8  4.4  4.2   Chloride 98 - 111 mmol/L 103  104  107   CO2 22 - 32 mmol/L 32  29  25   Calcium 8.9 - 10.3 mg/dL 9.2  9.3  8.5   Total Protein 6.5 - 8.1 g/dL 7.6  7.3    Total Bilirubin 0.3 - 1.2 mg/dL 0.7  0.3    Alkaline Phos 38 - 126 U/L 48  52    AST 15 - 41 U/L 16  12    ALT 0 - 44 U/L 17  8      No results found for: "MPROTEIN" Lab Results  Component Value Date   KPAFRELGTCHN 16.5 06/14/2019   LAMBDASER 14.3 06/14/2019   KAPLAMBRATIO 1.15 06/14/2019    RADIOGRAPHIC STUDIES: No results found.  Crane L Szczesniak 87 y.o. male with medical history significant for iron deficiency anemia who presents for a follow up visit.  After review of the labs and discussion with the patient the findings are most consistent with continued iron deficiency anemia.  GI evaluations: EGD 03/11/2017: normal stomach and esophagus. Mild duodenal stenosis Colonoscopy 11/17/2013: moderate diverticulosis  At this time there is concern for recurrent GI bleed.  His hemoglobin has trended downward from 11.4 on 06/03/2021 to 8.2 today 09/25/2021.  GI loss is the most likely etiology.  He has been evaluated by gastroenterology who noted that endoscopic intervention is not  indicated at this time.  #Normocytic Anemia # Iron Deficiency Anemia of Unclear Etiology -- Today hemoglobin improved to 11.7 with white blood cell count 4.7, MCV 88.5, and platelets of 273 --previously patient has to stop his ferrous sulfate '325mg'$  PO daily due to recurrent SBOs, as recommended by his surgical team.  --Patient has restarted his ferrous sulfate 325 mg p.o. daily and is tolerating it well. --repeat Iron panel, ferritin, and reticulocyte panel today.  --will recommend IV iron to the patient if iron levels remain low  --Steven [name] noted he had negative FOB cards and was evaluated by gastroenterology who  declined to perform endoscopic intervention. --RTC in 3 months time to reevaluate.  No orders of the defined types were placed in this encounter.  All questions were answered. The patient knows to call the clinic with any problems, questions or concerns.  A total of more than 30 minutes were spent on this encounter and over half of that time was spent on counseling and coordination of care as outlined above.   Ledell Peoples, MD Department of Hematology/Oncology [place] [facility] Phone: [phone] Pager: [phone] Email: [email or web address]  11/20/2021 10:59 AM

## 2021-11-25 DIAGNOSIS — D649 Anemia, unspecified: Secondary | ICD-10-CM | POA: Diagnosis not present

## 2021-12-11 DIAGNOSIS — L821 Other seborrheic keratosis: Secondary | ICD-10-CM | POA: Diagnosis not present

## 2021-12-11 DIAGNOSIS — L578 Other skin changes due to chronic exposure to nonionizing radiation: Secondary | ICD-10-CM | POA: Diagnosis not present

## 2021-12-11 DIAGNOSIS — L814 Other melanin hyperpigmentation: Secondary | ICD-10-CM | POA: Diagnosis not present

## 2021-12-11 DIAGNOSIS — D492 Neoplasm of unspecified behavior of bone, soft tissue, and skin: Secondary | ICD-10-CM | POA: Diagnosis not present

## 2021-12-11 DIAGNOSIS — Z85828 Personal history of other malignant neoplasm of skin: Secondary | ICD-10-CM | POA: Diagnosis not present

## 2021-12-11 DIAGNOSIS — C4442 Squamous cell carcinoma of skin of scalp and neck: Secondary | ICD-10-CM | POA: Diagnosis not present

## 2021-12-23 DIAGNOSIS — Z961 Presence of intraocular lens: Secondary | ICD-10-CM | POA: Diagnosis not present

## 2021-12-23 DIAGNOSIS — M452 Ankylosing spondylitis of cervical region: Secondary | ICD-10-CM | POA: Diagnosis not present

## 2021-12-23 DIAGNOSIS — D649 Anemia, unspecified: Secondary | ICD-10-CM | POA: Diagnosis not present

## 2021-12-23 DIAGNOSIS — Z79899 Other long term (current) drug therapy: Secondary | ICD-10-CM | POA: Diagnosis not present

## 2021-12-23 DIAGNOSIS — H35711 Central serous chorioretinopathy, right eye: Secondary | ICD-10-CM | POA: Diagnosis not present

## 2021-12-23 DIAGNOSIS — H44111 Panuveitis, right eye: Secondary | ICD-10-CM | POA: Diagnosis not present

## 2021-12-25 DIAGNOSIS — E785 Hyperlipidemia, unspecified: Secondary | ICD-10-CM | POA: Diagnosis not present

## 2021-12-25 DIAGNOSIS — Z8673 Personal history of transient ischemic attack (TIA), and cerebral infarction without residual deficits: Secondary | ICD-10-CM | POA: Diagnosis not present

## 2021-12-25 DIAGNOSIS — E1122 Type 2 diabetes mellitus with diabetic chronic kidney disease: Secondary | ICD-10-CM | POA: Diagnosis not present

## 2021-12-30 DIAGNOSIS — R3915 Urgency of urination: Secondary | ICD-10-CM | POA: Diagnosis not present

## 2022-01-19 DIAGNOSIS — R112 Nausea with vomiting, unspecified: Secondary | ICD-10-CM | POA: Diagnosis not present

## 2022-01-19 DIAGNOSIS — D509 Iron deficiency anemia, unspecified: Secondary | ICD-10-CM | POA: Diagnosis not present

## 2022-01-21 DIAGNOSIS — M1990 Unspecified osteoarthritis, unspecified site: Secondary | ICD-10-CM | POA: Diagnosis not present

## 2022-01-21 DIAGNOSIS — H209 Unspecified iridocyclitis: Secondary | ICD-10-CM | POA: Diagnosis not present

## 2022-01-21 DIAGNOSIS — Z79899 Other long term (current) drug therapy: Secondary | ICD-10-CM | POA: Diagnosis not present

## 2022-01-21 DIAGNOSIS — M459 Ankylosing spondylitis of unspecified sites in spine: Secondary | ICD-10-CM | POA: Diagnosis not present

## 2022-01-21 DIAGNOSIS — R6884 Jaw pain: Secondary | ICD-10-CM | POA: Diagnosis not present

## 2022-01-26 ENCOUNTER — Ambulatory Visit (HOSPITAL_COMMUNITY)
Admission: RE | Admit: 2022-01-26 | Discharge: 2022-01-26 | Disposition: A | Discharge status: Home / Self Care | Payer: Medicare Other | Source: Ambulatory Visit | Attending: Physician Assistant | Admitting: Physician Assistant

## 2022-01-26 DIAGNOSIS — I6529 Occlusion and stenosis of unspecified carotid artery: Secondary | ICD-10-CM | POA: Insufficient documentation

## 2022-01-28 DIAGNOSIS — D649 Anemia, unspecified: Secondary | ICD-10-CM | POA: Diagnosis not present

## 2022-01-29 DIAGNOSIS — D044 Carcinoma in situ of skin of scalp and neck: Secondary | ICD-10-CM | POA: Diagnosis not present

## 2022-01-29 DIAGNOSIS — K219 Gastro-esophageal reflux disease without esophagitis: Secondary | ICD-10-CM | POA: Diagnosis not present

## 2022-01-29 DIAGNOSIS — R238 Other skin changes: Secondary | ICD-10-CM | POA: Diagnosis not present

## 2022-01-29 DIAGNOSIS — C4442 Squamous cell carcinoma of skin of scalp and neck: Secondary | ICD-10-CM | POA: Diagnosis not present

## 2022-01-29 DIAGNOSIS — D492 Neoplasm of unspecified behavior of bone, soft tissue, and skin: Secondary | ICD-10-CM | POA: Diagnosis not present

## 2022-02-20 ENCOUNTER — Other Ambulatory Visit: Payer: Self-pay | Admitting: Hematology and Oncology

## 2022-02-20 ENCOUNTER — Inpatient Hospital Stay: Payer: Medicare Other | Admitting: Hematology and Oncology

## 2022-02-20 ENCOUNTER — Other Ambulatory Visit: Payer: Self-pay

## 2022-02-20 ENCOUNTER — Inpatient Hospital Stay: Payer: Medicare Other | Attending: Hematology and Oncology

## 2022-02-20 VITALS — BP 126/72 | HR 65 | Temp 98.2°F | Wt 135.8 lb

## 2022-02-20 DIAGNOSIS — D509 Iron deficiency anemia, unspecified: Secondary | ICD-10-CM | POA: Insufficient documentation

## 2022-02-20 DIAGNOSIS — Z803 Family history of malignant neoplasm of breast: Secondary | ICD-10-CM | POA: Diagnosis not present

## 2022-02-20 DIAGNOSIS — Z7982 Long term (current) use of aspirin: Secondary | ICD-10-CM | POA: Insufficient documentation

## 2022-02-20 DIAGNOSIS — Z800 Family history of malignant neoplasm of digestive organs: Secondary | ICD-10-CM | POA: Insufficient documentation

## 2022-02-20 DIAGNOSIS — D500 Iron deficiency anemia secondary to blood loss (chronic): Secondary | ICD-10-CM | POA: Diagnosis not present

## 2022-02-20 DIAGNOSIS — D649 Anemia, unspecified: Secondary | ICD-10-CM | POA: Diagnosis not present

## 2022-02-20 DIAGNOSIS — Z7902 Long term (current) use of antithrombotics/antiplatelets: Secondary | ICD-10-CM | POA: Insufficient documentation

## 2022-02-20 DIAGNOSIS — E785 Hyperlipidemia, unspecified: Secondary | ICD-10-CM | POA: Diagnosis not present

## 2022-02-20 DIAGNOSIS — K565 Intestinal adhesions [bands], unspecified as to partial versus complete obstruction: Secondary | ICD-10-CM | POA: Diagnosis not present

## 2022-02-20 DIAGNOSIS — Z79899 Other long term (current) drug therapy: Secondary | ICD-10-CM | POA: Diagnosis not present

## 2022-02-20 DIAGNOSIS — Z87442 Personal history of urinary calculi: Secondary | ICD-10-CM | POA: Diagnosis not present

## 2022-02-20 LAB — CBC WITH DIFFERENTIAL (CANCER CENTER ONLY)
Abs Immature Granulocytes: 0.02 10*3/uL (ref 0.00–0.07)
Basophils Absolute: 0 10*3/uL (ref 0.0–0.1)
Basophils Relative: 1 %
Eosinophils Absolute: 0.2 10*3/uL (ref 0.0–0.5)
Eosinophils Relative: 3 %
HCT: 33.9 % — ABNORMAL LOW (ref 39.0–52.0)
Hemoglobin: 11.5 g/dL — ABNORMAL LOW (ref 13.0–17.0)
Immature Granulocytes: 0 %
Lymphocytes Relative: 16 %
Lymphs Abs: 0.9 10*3/uL (ref 0.7–4.0)
MCH: 33.4 pg (ref 26.0–34.0)
MCHC: 33.9 g/dL (ref 30.0–36.0)
MCV: 98.5 fL (ref 80.0–100.0)
Monocytes Absolute: 0.4 10*3/uL (ref 0.1–1.0)
Monocytes Relative: 7 %
Neutro Abs: 4.3 10*3/uL (ref 1.7–7.7)
Neutrophils Relative %: 73 %
Platelet Count: 262 10*3/uL (ref 150–400)
RBC: 3.44 MIL/uL — ABNORMAL LOW (ref 4.22–5.81)
RDW: 13.5 % (ref 11.5–15.5)
WBC Count: 5.8 10*3/uL (ref 4.0–10.5)
nRBC: 0 % (ref 0.0–0.2)

## 2022-02-20 LAB — IRON AND IRON BINDING CAPACITY (CC-WL,HP ONLY)
Iron: 75 ug/dL (ref 45–182)
Saturation Ratios: 25 % (ref 17.9–39.5)
TIBC: 304 ug/dL (ref 250–450)
UIBC: 229 ug/dL (ref 117–376)

## 2022-02-20 LAB — CMP (CANCER CENTER ONLY)
ALT: 12 U/L (ref 0–44)
AST: 14 U/L — ABNORMAL LOW (ref 15–41)
Albumin: 4 g/dL (ref 3.5–5.0)
Alkaline Phosphatase: 52 U/L (ref 38–126)
Anion gap: 3 — ABNORMAL LOW (ref 5–15)
BUN: 13 mg/dL (ref 8–23)
CO2: 32 mmol/L (ref 22–32)
Calcium: 9.2 mg/dL (ref 8.9–10.3)
Chloride: 103 mmol/L (ref 98–111)
Creatinine: 0.78 mg/dL (ref 0.61–1.24)
GFR, Estimated: >60 mL/min (ref >60)
Glucose, Bld: 148 mg/dL — ABNORMAL HIGH (ref 70–99)
Potassium: 3.7 mmol/L (ref 3.5–5.1)
Sodium: 138 mmol/L (ref 135–145)
Total Bilirubin: 0.7 mg/dL (ref 0.3–1.2)
Total Protein: 7.9 g/dL (ref 6.5–8.1)

## 2022-02-20 LAB — RETIC PANEL
Immature Retic Fract: 14.6 % (ref 2.3–15.9)
RBC.: 3.4 MIL/uL — ABNORMAL LOW (ref 4.22–5.81)
Retic Count, Absolute: 29.2 10*3/uL (ref 19.0–186.0)
Retic Ct Pct: 0.9 % (ref 0.4–3.1)
Reticulocyte Hemoglobin: 38.4 pg (ref >27.9)

## 2022-02-20 LAB — FERRITIN: Ferritin: 110 ng/mL (ref 24–336)

## 2022-02-20 NOTE — Progress Notes (Signed)
[place] [phone]   [phone]  PROGRESS NOTE  Patient Care Team: [name], MD as PCP - General (Internal Medicine) Orson Slick, MD as Consulting Physician (Hematology and Oncology)  Hematological/Oncological History #Normocytic Anemia #Iron Deficiency Anemia 1) 01/01/2017: WBC 5.1, Hgb 11.0, Plt 254, MCV 91.6 2) 12/07/2017: WBC 8.0, Hgb 11.9, Plt 352, MCV 91.4. Iron 76, TIBC 359, Sat 21%, Ferritin 31 3) 03/09/2019: WBC 14.3, Hgb 11.1, Plt 374, MCV 88.8 4) 05/22/2019: WBC 5.5, Hgb 10.7, Plt 311, MCV 83.7. Routine PCP visit. 5) 06/14/2019: establish care with Dr. Lorenso Courier. WBC 5.0, Hgb 10.3, MCV 84.9, Plt 320 6) 09/14/2019: WBC 6.2, Hgb 10.9, MCV 91.2, Plt 209 7)  11/01/2019: WBC 9.2, Hgb 12.6, MVC 97.2, Plt 299 8) 11/05/2019: patient admitted with SBO, instructed to stop PO iron indefinitely.  9) 02/28/2020: admitted with SBO, Hgb drop to 9.6 while inpatient 10) 05/17/2020: Hgb 11.7, increased without intervention.  11) 09/05/2020: WBC 6.6, Hgb 10.9, MCV 94.6, Plt 259 12) 6/6-6/17/2022: IV feraheme 510 mg x 2 doses  13) 6/21-6/30/2023: IV feraheme 510 mg x 2 doses  14) 02/20/2022: WBC 5.8, Hgb 11.5, MCV 98.5, Plt 262  Interval History:  Steven STRAWSER 87 y.o. male with medical history significant for iron deficiency anemia who presents for a follow up visit. The patient's last visit was on 11/20/2021. In the interim since the last visit Mr. Safley has been at his baseline level of health.   On exam today Mr. [place] notes he has been well in the interim since our last visit 3 months ago.  He reports he had no ED visits or hospitalizations.  He has noted no further bowel obstructions.  He notes his energy levels are pretty good and he currently ranks his energy is an 8 out of 10.  He is using an exercise program to in order to build up his strength.  He has had no bleeding other than some occasional hemorrhoidal bleeding.  He notes that he is using  hemorrhoid creams but is not using any stool softeners.  He is eating Benefiber.  He notes he is taking iron pills without any difficulty.  He is not having any constipation, stomach upset, or nausea or vomiting.  He notes that everything has been pretty stable.  He had 2 large lesions removed from his scalp by his dermatologist approximately 1 month ago.  They are currently healing.  He denies having fevers, chills, sweats, nausea, vomiting or diarrhea.  Otherwise a full 10 point ROS is listed below.   MEDICAL HISTORY:  Past Medical History:  Diagnosis Date   Allergy    enviromental   Anemia 2017   Anxiety    Arthritis    ankylosing spodilitis   Blood transfusion without reported diagnosis    during surgery   Cataract    Depression    Dyspnea    with exertion - had Echo done 09/29/16   History of kidney stones    Hyperlipidemia    Intestinal obstruction ([place])    Pneumonia    as a child    SURGICAL HISTORY: Past Surgical History:  Procedure Laterality Date   ABDOMINAL SURGERY     had abcess   APPENDECTOMY     CHOLECYSTECTOMY     with lysis of adhesions   COLONOSCOPY     COLOSTOMY     COLOSTOMY REVERSAL     EYE SURGERY Left    scar tissue removed from cornea  EYE SURGERY Right    cataract surgery with lens implant   JOINT REPLACEMENT Right    hip  x 2 1999 and 2007   SHOULDER ARTHROSCOPY WITH ROTATOR CUFF REPAIR AND SUBACROMIAL DECOMPRESSION Left 03/02/2013   Procedure: LEFT SHOULDER ARTHROSCOPY WITH SUBACROMIAL DECOMPRESSION DISTAL CALVICLE RESECTION AND ROTATOR CUFF REPAIR ;  Surgeon: [name], MD;  Location: [place];  Service: Orthopedics;  Laterality: Left;   TOTAL HIP REVISION Right 11/06/2016   Procedure: TOTAL HIP REVISION OF THE ACETABULAR COMPONENT;  Surgeon: [name], MD;  Location: [place];  Service: Orthopedics;  Laterality: Right;   TRANSCAROTID ARTERY REVASCULARIZATION  Left 03/31/2021   Procedure: TRANSCAROTID ARTERY REVASCULARIZATION;  Surgeon:  Broadus , MD;  Location: [facility] OR;  Service: Vascular;  Laterality: Left;   ULTRASOUND GUIDANCE FOR VASCULAR ACCESS Right 03/31/2021   Procedure: ULTRASOUND GUIDANCE FOR VASCULAR ACCESS;  Surgeon: Broadus , MD;  Location: [facility] OR;  Service: Vascular;  Laterality: Right;    SOCIAL HISTORY: Social History   Socioeconomic History   Marital status: Married    Spouse name: Not on file   Number of children: 2   Years of education: Not on file   Highest education level: Not on file  Occupational History   Occupation: retired  Tobacco Use   Smoking status: Former   Smokeless tobacco: Never  Scientific laboratory technician Use: Never used  Substance and Sexual Activity   Alcohol use: Yes    Comment: 3 beers or glasses of wine a day--reports stopped ETOH 11/01/16    Drug use: No   Sexual activity: Never  Other Topics Concern   Not on file  Social History Narrative   Not on file   Social Determinants of Health   Financial Resource Strain: Low Risk  (03/09/2019)   Overall Financial Resource Strain (CARDIA)    Difficulty of Paying Living Expenses: Not hard at all  Food Insecurity: No Food Insecurity (03/09/2019)   Hunger Vital Sign    Worried About Running Out of Food in the Last Year: Never true    [place] in the Last Year: Never true  Transportation Needs: No Transportation Needs (03/09/2019)   PRAPARE - Hydrologist (Medical): No    Lack of Transportation (Non-Medical): No  Physical Activity: Insufficiently Active (03/09/2019)   Exercise Vital Sign    Days of Exercise per Week: 5 days    Minutes of Exercise per Session: 10 min  Stress: No Stress Concern Present (03/09/2019)   [place]    Feeling of Stress : Not at all  Social Connections: Unknown (03/09/2019)   Social Connection and Isolation Panel [NHANES]    Frequency of Communication with Friends and Family: Patient  refused    Frequency of Social Gatherings with Friends and Family: Patient refused    Attends Religious Services: Patient refused    Active Member of Clubs or Organizations: Patient refused    Attends Archivist Meetings: Patient refused    Marital Status: Patient refused  Intimate Partner Violence: Not At Risk (03/09/2019)   Humiliation, Afraid, Rape, and Kick questionnaire    Fear of Current or Ex-Partner: No    Emotionally Abused: No    Physically Abused: No    Sexually Abused: No    FAMILY HISTORY: Family History  Problem Relation Age of Onset   Heart attack Mother    Diabetes Mother  Breast cancer Mother    Heart attack Maternal Grandfather    Pancreatic cancer Brother    Colon cancer Neg Hx    Esophageal cancer Neg Hx    Stomach cancer Neg Hx     ALLERGIES:  is allergic to indomethacin and lactose intolerance (gi).  MEDICATIONS:  Current Outpatient Medications  Medication Sig Dispense Refill   acetaminophen (TYLENOL) 650 MG CR tablet Take 1 tablet (650 mg total) by mouth every 8 (eight) hours as needed for pain or fever.     Artificial Tear Solution (SOOTHE XP) SOLN Place 1 drop into both eyes 2 (two) times daily.     aspirin EC 325 MG EC tablet Take 1 tablet (325 mg total) by mouth daily. 30 tablet 1   B Complex-C (B-COMPLEX WITH VITAMIN C) tablet Take 1 tablet by mouth daily.      Biotin 1 MG CAPS Take 1 capsule by mouth daily.      Biotin 5000 MCG TABS Take 1 tablet by mouth daily.     Calcium Citrate 250 MG TABS Take 1 tablet by mouth daily.     cholecalciferol (VITAMIN D) 1000 units tablet Take 1,000 Units by mouth daily.     citalopram (CELEXA) 20 MG tablet Take 20 mg by mouth daily.      clopidogrel (PLAVIX) 75 MG tablet Take 1 tablet (75 mg total) by mouth daily. 30 tablet 1   ferrous sulfate 325 (65 FE) MG tablet Take 1 tablet (325 mg total) by mouth daily with breakfast. 30 tablet 0   folic acid (FOLVITE) 1 MG tablet Take 1 mg by mouth in the  morning, at noon, and at bedtime.     ipratropium (ATROVENT) 0.06 % nasal spray Place 2 sprays into both nostrils 2 (two) times daily as needed (allergies).     methotrexate (RHEUMATREX) 2.5 MG tablet Take 20 mg by mouth once a week. TAKE 8 TABLETS (20 MG) BY MOUTH ONCE A WEEK.     Multiple Vitamins-Minerals (PRESERVISION AREDS 2 PO) Take 1 tablet by mouth in the morning and at bedtime.     pantoprazole (PROTONIX) 40 MG tablet Take 1 tablet (40 mg total) by mouth daily. 30 tablet 1   rosuvastatin (CRESTOR) 20 MG tablet Take 1 tablet (20 mg total) by mouth at bedtime. 30 tablet 1   Wheat Dextrin (BENEFIBER) POWD Take 1 Scoop by mouth daily.     No current facility-administered medications for this visit.    REVIEW OF SYSTEMS:   Constitutional: ( - ) fevers, ( - )  chills , ( - ) night sweats Eyes: ( - ) blurriness of vision, ( - ) double vision, ( - ) watery eyes Ears, nose, mouth, throat, and face: ( - ) mucositis, ( - ) sore throat Respiratory: ( - ) cough, ( - ) dyspnea, ( - ) wheezes Cardiovascular: ( - ) palpitation, ( - ) chest discomfort, ( - ) lower extremity swelling Gastrointestinal:  ( - ) nausea, ( - ) heartburn, ( - ) change in bowel habits Skin: ( - ) abnormal skin rashes Lymphatics: ( - ) new lymphadenopathy, ( - ) easy bruising Neurological: ( - ) numbness, ( - ) tingling, ( - ) new weaknesses Behavioral/Psych: ( - ) mood change, ( - ) new changes  All other systems were reviewed with the patient and are negative.  PHYSICAL EXAMINATION: ECOG PERFORMANCE STATUS: 1 - Symptomatic but completely ambulatory  There were no vitals filed for this  visit.  There were no vitals filed for this visit.   GENERAL: well appearing elderly Caucasian male in NAD  SKIN: skin color, texture, turgor are normal, no rashes or significant lesions EYES: conjunctiva are pink and non-injected, sclera clear LUNGS: clear to auscultation and percussion with normal breathing effort HEART: regular  rate & rhythm and no murmurs and no lower extremity edema ABDOMEN: soft, non-tender, non-distended, normal bowel sounds. No HSM appreciated. Musculoskeletal: no cyanosis of digits and no clubbing  PSYCH: alert & oriented x 3, fluent speech NEURO: no focal motor/sensory deficits  LABORATORY DATA:  I have reviewed the data as listed    Latest Ref Rng & Units 02/20/2022   11:11 AM 11/20/2021    9:59 AM 09/25/2021    2:17 PM  CBC  WBC 4.0 - 10.5 K/uL 5.8  4.7  7.1   Hemoglobin 13.0 - 17.0 g/dL 11.5  11.7  8.2   Hematocrit 39.0 - 52.0 % 33.9  35.5  26.2   Platelets 150 - 400 K/uL 262  273  330        Latest Ref Rng & Units 11/20/2021    9:59 AM 09/25/2021    2:17 PM 09/01/2021    1:58 AM  CMP  Glucose 70 - 99 mg/dL 129  155  141   BUN 8 - 23 mg/dL '12  19  10   '$ Creatinine 0.61 - 1.24 mg/dL 0.73  0.97  0.74   Sodium 135 - 145 mmol/L 137  137  138   Potassium 3.5 - 5.1 mmol/L 3.8  4.4  4.2   Chloride 98 - 111 mmol/L 103  104  107   CO2 22 - 32 mmol/L 32  29  25   Calcium 8.9 - 10.3 mg/dL 9.2  9.3  8.5   Total Protein 6.5 - 8.1 g/dL 7.6  7.3    Total Bilirubin 0.3 - 1.2 mg/dL 0.7  0.3    Alkaline Phos 38 - 126 U/L 48  52    AST 15 - 41 U/L 16  12    ALT 0 - 44 U/L 17  8      No results found for: "MPROTEIN" Lab Results  Component Value Date   KPAFRELGTCHN 16.5 06/14/2019   LAMBDASER 14.3 06/14/2019   KAPLAMBRATIO 1.15 06/14/2019    RADIOGRAPHIC STUDIES: VAS US CAROTID  Result Date: 01/26/2022 Carotid Arterial Duplex Study Patient Name:  TULIO FACUNDO Bertino  Date of Exam:   01/26/2022 Medical Rec #: [id number]      Accession #:    [id number] Date of Birth: 1934-11-18      Patient Gender: M Patient Age:   66 years Exam Location:  [facility] Procedure:      VAS US CAROTID Referring Phys: Aldona Bar RHYNE --------------------------------------------------------------------------------  Indications:       TIA, CVA, Carotid artery disease and Left stent. Risk Factors:       Hyperlipidemia, past history of smoking. Other Factors:     03/31/2021: Left TCAR. Limitations        Today's exam was limited due to heavy calcification and the                    resulting shadowing and Fused Spine, Unable to turn head. Comparison Study:  Prior Carotid 05/02/21 Performing Technologist: Elta Guadeloupe RVT, RDMS  Examination Guidelines: A complete evaluation includes B-mode imaging, spectral Doppler, color Doppler, and power Doppler as needed of all accessible portions of each  vessel. Bilateral testing is considered an integral part of a complete examination. Limited examinations for reoccurring indications may be performed as noted.  Right Carotid Findings: +----------+-------+--------+--------+--------------------------------+--------+           PSV    EDV cm/sStenosisPlaque Description              Comments           cm/s                                                            +----------+-------+--------+--------+--------------------------------+--------+ CCA Prox  88     14                                                       +----------+-------+--------+--------+--------------------------------+--------+ CCA Mid   93     13                                                       +----------+-------+--------+--------+--------------------------------+--------+ CCA Distal118    17      <50%    diffuse, heterogenous and                                                 calcific                                 +----------+-------+--------+--------+--------------------------------+--------+ ICA Prox  100    22      1-39%   hyperechoic and calcific                 +----------+-------+--------+--------+--------------------------------+--------+ ICA Mid   105    29      1-39%   calcific                                 +----------+-------+--------+--------+--------------------------------+--------+ ICA Distal79     21                                                        +----------+-------+--------+--------+--------------------------------+--------+ ECA       88     8                                                        +----------+-------+--------+--------+--------------------------------+--------+ +----------+--------+-------+----------------+-------------------+           PSV cm/sEDV cmsDescribe        Arm Pressure (  mmHG) +----------+--------+-------+----------------+-------------------+ [id number]            Multiphasic, WNL                    +----------+--------+-------+----------------+-------------------+ +---------+--------+--+--------+--+----------------------------+ VertebralPSV cm/s48EDV cm/s11Antegrade and High resistant +---------+--------+--+--------+--+----------------------------+  Left Carotid Findings: +----------+--------+--------+--------+------------------------------+---------+           PSV cm/sEDV cm/sStenosisPlaque Description            Comments  +----------+--------+--------+--------+------------------------------+---------+ CCA Prox  97      15      <50%    diffuse and hypoechoic                  +----------+--------+--------+--------+------------------------------+---------+ CCA Mid   79      17      <50%    hypoechoic and irregular                +----------+--------+--------+--------+------------------------------+---------+ CCA Distal82      18      <50%    diffuse, heterogenous,        Stent                                       irregular and calcific                  +----------+--------+--------+--------+------------------------------+---------+ ICA Prox  204     43      40-59%  diffuse, heterogenous,        Shadowing                                   irregular and calcific                  +----------+--------+--------+--------+------------------------------+---------+ ICA Mid   164     31      1-39%   hypoechoic                               +----------+--------+--------+--------+------------------------------+---------+ ICA Distal58      18                                                      +----------+--------+--------+--------+------------------------------+---------+ ECA       234     27      >50%    diffuse, heterogenous,                                                    irregular and calcific                  +----------+--------+--------+--------+------------------------------+---------+ +----------+--------+--------+----------------+-------------------+           PSV cm/sEDV cm/sDescribe        Arm Pressure (mmHG) +----------+--------+--------+----------------+-------------------+ [id number]             Multiphasic, WNL                    +----------+--------+--------+----------------+-------------------+ +---------+--------+--+--------+-+----------------------------+ VertebralPSV cm/s49EDV cm/s7Antegrade and High resistant +---------+--------+--+--------+-+----------------------------+  Summary: Right Carotid: Velocities in the right ICA are consistent with a 1-39% stenosis.                Non-hemodynamically significant plaque <50% noted in the CCA. Left Carotid: Velocities in the left ICA are consistent with a 40-59% stenosis.               Non-hemodynamically significant plaque <50% noted in the CCA. The               ECA appears >50% stenosed. Vertebrals:  Bilateral vertebral arteries demonstrate antegrade flow. Bilateral              vertebral arteries demonstrate high resistant flow. Subclavians: Normal flow hemodynamics were seen in bilateral subclavian              arteries. *See table(s) above for measurements and observations.  Electronically signed by Orlie Pollen on 01/26/2022 at 5:40:56 PM.    Final     Chisholm L Qadir 87 y.o. male with medical history significant for iron deficiency anemia who presents for a follow up visit.  After review of the labs and  discussion with the patient the findings are most consistent with continued iron deficiency anemia.  GI evaluations: EGD 03/11/2017: normal stomach and esophagus. Mild duodenal stenosis Colonoscopy 11/17/2013: moderate diverticulosis  At this time there is concern for recurrent GI bleed.  His hemoglobin has trended downward from 11.4 on 06/03/2021 to 8.2 on 09/25/2021.  GI loss is the most likely etiology.  He has been evaluated by gastroenterology who noted that endoscopic intervention is not indicated at this time.  #Normocytic Anemia # Iron Deficiency Anemia of Unclear Etiology -- Today hemoglobin improved to 11.5 with white blood cell count 5.8, MCV 98.5, and platelets of 262 --previously patient has to stop his ferrous sulfate '325mg'$  PO daily due to recurrent SBOs, as recommended by his surgical team.  --Patient has restarted his ferrous sulfate 325 mg p.o. daily and is tolerating it well. --repeat Iron panel, ferritin, and reticulocyte panel today.  --will recommend IV iron to the patient if iron levels remain low  --Mr. Culhane noted he had negative FOB cards and was evaluated by gastroenterology who declined to perform endoscopic intervention. --RTC in 3 months for labs and 6 months time in clinic to reevaluate.  No orders of the defined types were placed in this encounter.  All questions were answered. The patient knows to call the clinic with any problems, questions or concerns.  A total of more than 30 minutes were spent on this encounter and over half of that time was spent on counseling and coordination of care as outlined above.   Ledell Peoples, MD Department of Hematology/Oncology [place] at [facility] Phone: [phone] Pager: [phone] Email: [email or web address]  02/20/2022 11:23 AM

## 2022-02-21 ENCOUNTER — Encounter: Payer: Self-pay | Admitting: Hematology and Oncology

## 2022-02-26 DIAGNOSIS — C4442 Squamous cell carcinoma of skin of scalp and neck: Secondary | ICD-10-CM | POA: Diagnosis not present

## 2022-02-26 DIAGNOSIS — D044 Carcinoma in situ of skin of scalp and neck: Secondary | ICD-10-CM | POA: Diagnosis not present

## 2022-02-26 DIAGNOSIS — M948X9 Other specified disorders of cartilage, unspecified sites: Secondary | ICD-10-CM | POA: Diagnosis not present

## 2022-03-17 DIAGNOSIS — E119 Type 2 diabetes mellitus without complications: Secondary | ICD-10-CM | POA: Diagnosis not present

## 2022-03-26 DIAGNOSIS — E1122 Type 2 diabetes mellitus with diabetic chronic kidney disease: Secondary | ICD-10-CM | POA: Diagnosis not present

## 2022-03-26 DIAGNOSIS — Z8673 Personal history of transient ischemic attack (TIA), and cerebral infarction without residual deficits: Secondary | ICD-10-CM | POA: Diagnosis not present

## 2022-03-26 DIAGNOSIS — E785 Hyperlipidemia, unspecified: Secondary | ICD-10-CM | POA: Diagnosis not present

## 2022-03-31 DIAGNOSIS — R3915 Urgency of urination: Secondary | ICD-10-CM | POA: Diagnosis not present

## 2022-04-14 DIAGNOSIS — C4442 Squamous cell carcinoma of skin of scalp and neck: Secondary | ICD-10-CM | POA: Diagnosis not present

## 2022-04-14 DIAGNOSIS — D044 Carcinoma in situ of skin of scalp and neck: Secondary | ICD-10-CM | POA: Diagnosis not present

## 2022-05-18 ENCOUNTER — Other Ambulatory Visit: Payer: Self-pay | Admitting: *Deleted

## 2022-05-18 DIAGNOSIS — I6529 Occlusion and stenosis of unspecified carotid artery: Secondary | ICD-10-CM

## 2022-05-22 ENCOUNTER — Other Ambulatory Visit: Payer: Self-pay | Admitting: Hematology and Oncology

## 2022-05-22 ENCOUNTER — Inpatient Hospital Stay: Payer: Medicare Other | Attending: Hematology and Oncology

## 2022-05-22 DIAGNOSIS — D500 Iron deficiency anemia secondary to blood loss (chronic): Secondary | ICD-10-CM

## 2022-05-22 DIAGNOSIS — D509 Iron deficiency anemia, unspecified: Secondary | ICD-10-CM | POA: Diagnosis not present

## 2022-05-22 LAB — CMP (CANCER CENTER ONLY)
ALT: 13 U/L (ref 0–44)
AST: 15 U/L (ref 15–41)
Albumin: 3.7 g/dL (ref 3.5–5.0)
Alkaline Phosphatase: 52 U/L (ref 38–126)
Anion gap: 3 — ABNORMAL LOW (ref 5–15)
BUN: 14 mg/dL (ref 8–23)
CO2: 32 mmol/L (ref 22–32)
Calcium: 9.1 mg/dL (ref 8.9–10.3)
Chloride: 104 mmol/L (ref 98–111)
Creatinine: 0.86 mg/dL (ref 0.61–1.24)
GFR, Estimated: >60 mL/min (ref >60)
Glucose, Bld: 119 mg/dL — ABNORMAL HIGH (ref 70–99)
Potassium: 3.9 mmol/L (ref 3.5–5.1)
Sodium: 139 mmol/L (ref 135–145)
Total Bilirubin: 0.7 mg/dL (ref 0.3–1.2)
Total Protein: 6.9 g/dL (ref 6.5–8.1)

## 2022-05-22 LAB — RETIC PANEL
Immature Retic Fract: 17 % — ABNORMAL HIGH (ref 2.3–15.9)
RBC.: 3.39 MIL/uL — ABNORMAL LOW (ref 4.22–5.81)
Retic Count, Absolute: 25.1 10*3/uL (ref 19.0–186.0)
Retic Ct Pct: 0.7 % (ref 0.4–3.1)
Reticulocyte Hemoglobin: 38.3 pg (ref >27.9)

## 2022-05-22 LAB — CBC WITH DIFFERENTIAL (CANCER CENTER ONLY)
Abs Immature Granulocytes: 0.07 10*3/uL (ref 0.00–0.07)
Basophils Absolute: 0.1 10*3/uL (ref 0.0–0.1)
Basophils Relative: 1 %
Eosinophils Absolute: 0.3 10*3/uL (ref 0.0–0.5)
Eosinophils Relative: 6 %
HCT: 34 % — ABNORMAL LOW (ref 39.0–52.0)
Hemoglobin: 11.6 g/dL — ABNORMAL LOW (ref 13.0–17.0)
Immature Granulocytes: 1 %
Lymphocytes Relative: 16 %
Lymphs Abs: 1 10*3/uL (ref 0.7–4.0)
MCH: 33.7 pg (ref 26.0–34.0)
MCHC: 34.1 g/dL (ref 30.0–36.0)
MCV: 98.8 fL (ref 80.0–100.0)
Monocytes Absolute: 0.4 10*3/uL (ref 0.1–1.0)
Monocytes Relative: 7 %
Neutro Abs: 4.1 10*3/uL (ref 1.7–7.7)
Neutrophils Relative %: 69 %
Platelet Count: 253 10*3/uL (ref 150–400)
RBC: 3.44 MIL/uL — ABNORMAL LOW (ref 4.22–5.81)
RDW: 13 % (ref 11.5–15.5)
WBC Count: 6 10*3/uL (ref 4.0–10.5)
nRBC: 0 % (ref 0.0–0.2)

## 2022-05-22 LAB — IRON AND IRON BINDING CAPACITY (CC-WL,HP ONLY)
Iron: 121 ug/dL (ref 45–182)
Saturation Ratios: 41 % — ABNORMAL HIGH (ref 17.9–39.5)
TIBC: 294 ug/dL (ref 250–450)
UIBC: 173 ug/dL (ref 117–376)

## 2022-05-22 LAB — FERRITIN: Ferritin: 73 ng/mL (ref 24–336)

## 2022-05-26 DIAGNOSIS — Z79899 Other long term (current) drug therapy: Secondary | ICD-10-CM | POA: Diagnosis not present

## 2022-05-26 DIAGNOSIS — M452 Ankylosing spondylitis of cervical region: Secondary | ICD-10-CM | POA: Diagnosis not present

## 2022-05-26 DIAGNOSIS — H35711 Central serous chorioretinopathy, right eye: Secondary | ICD-10-CM | POA: Diagnosis not present

## 2022-05-26 DIAGNOSIS — H44111 Panuveitis, right eye: Secondary | ICD-10-CM | POA: Diagnosis not present

## 2022-05-26 DIAGNOSIS — Z961 Presence of intraocular lens: Secondary | ICD-10-CM | POA: Diagnosis not present

## 2022-05-29 ENCOUNTER — Ambulatory Visit: Payer: Medicare Other | Admitting: Physician Assistant

## 2022-05-29 ENCOUNTER — Ambulatory Visit (HOSPITAL_COMMUNITY)
Admission: RE | Admit: 2022-05-29 | Discharge: 2022-05-29 | Disposition: A | Discharge status: Home / Self Care | Payer: Medicare Other | Source: Ambulatory Visit | Attending: Vascular Surgery | Admitting: Vascular Surgery

## 2022-05-29 VITALS — BP 97/52 | HR 63 | Temp 98.1°F | Resp 20 | Ht 67.0 in | Wt 133.2 lb

## 2022-05-29 DIAGNOSIS — I6529 Occlusion and stenosis of unspecified carotid artery: Secondary | ICD-10-CM

## 2022-05-29 NOTE — Progress Notes (Signed)
Established Carotid Patient   History of Present Illness   Steven [name] is a 88 y.o. (09/29/1934) male who presents for surveillance of carotid artery stenosis.  He has a history of left TCAR on 03/31/2021 by Dr. Virl Cagey for symptomatic carotid artery stenosis.   At follow-up today he is doing well.  He denies any symptoms of stroke.  He denies any amaurosis fugax, numbness, slurred speech, or weakness.  He is still taking his Plavix, aspirin, and statin.  Current Outpatient Medications  Medication Sig Dispense Refill   Artificial Tear Solution (SOOTHE XP) SOLN Place 1 drop into both eyes 2 (two) times daily.     aspirin EC 325 MG EC tablet Take 1 tablet (325 mg total) by mouth daily. 30 tablet 1   B Complex-C (B-COMPLEX WITH VITAMIN C) tablet Take 1 tablet by mouth daily.      Biotin 5000 MCG TABS Take 1 tablet by mouth daily.     Calcium Citrate 250 MG TABS Take 1 tablet by mouth daily.     cholecalciferol (VITAMIN D) 1000 units tablet Take 1,000 Units by mouth daily.     citalopram (CELEXA) 20 MG tablet Take 20 mg by mouth daily.      clopidogrel (PLAVIX) 75 MG tablet Take 1 tablet (75 mg total) by mouth daily. 30 tablet 1   folic acid (FOLVITE) 1 MG tablet Take 1 mg by mouth in the morning, at noon, and at bedtime.     ipratropium (ATROVENT) 0.06 % nasal spray Place 2 sprays into both nostrils 2 (two) times daily as needed (allergies).     methotrexate (RHEUMATREX) 2.5 MG tablet Take 20 mg by mouth once a week. TAKE 8 TABLETS (20 MG) BY MOUTH ONCE A WEEK.     Multiple Vitamins-Minerals (PRESERVISION AREDS 2 PO) Take 1 tablet by mouth in the morning and at bedtime.     pantoprazole (PROTONIX) 40 MG tablet Take 1 tablet (40 mg total) by mouth daily. 30 tablet 1   rosuvastatin (CRESTOR) 20 MG tablet Take 1 tablet (20 mg total) by mouth at bedtime. 30 tablet 1   Wheat Dextrin (BENEFIBER) POWD Take 1 Scoop by mouth daily.     ferrous sulfate 325 (65 FE) MG tablet Take 1 tablet (325 mg  total) by mouth daily with breakfast. 30 tablet 0   No current facility-administered medications for this visit.    REVIEW OF SYSTEMS (negative unless checked):   Cardiac:  []$  Chest pain or chest pressure? []$  Shortness of breath upon activity? []$  Shortness of breath when lying flat? []$  Irregular heart rhythm?  Vascular:  []$  Pain in calf, thigh, or hip brought on by walking? []$  Pain in feet at night that wakes you up from your sleep? []$  Blood clot in your veins? []$  Leg swelling?  Pulmonary:  []$  Oxygen at home? []$  Productive cough? []$  Wheezing?  Neurologic:  []$  Sudden weakness in arms or legs? []$  Sudden numbness in arms or legs? []$  Sudden onset of difficult speaking or slurred speech? []$  Temporary loss of vision in one eye? []$  Problems with dizziness?  Gastrointestinal:  []$  Blood in stool? []$  Vomited blood?  Genitourinary:  []$  Burning when urinating? []$  Blood in urine?  Psychiatric:  []$  Major depression  Hematologic:  []$  Bleeding problems? []$  Problems with blood clotting?  Dermatologic:  []$  Rashes or ulcers?  Constitutional:  []$  Fever or chills?  Ear/Nose/Throat:  []$  Change in hearing? []$  Nose bleeds? []$  Sore throat?  Musculoskeletal:  []$  Back pain? []$  Joint pain? []$  Muscle pain?   Physical Examination   Vitals:   05/29/22 1348 05/29/22 1352  BP: (!) 97/55 (!) 97/52  Pulse: 63   Resp: 20   Temp: 98.1 F (36.7 C)   TempSrc: Temporal   SpO2: 97%   Weight: 133 lb 3.2 oz (60.4 kg)   Height: 5' 7"$  (1.702 m)    Body mass index is 20.86 kg/m.  General:  WDWN in NAD; vital signs documented above Gait: Not observed HENT: WNL, normocephalic Pulmonary: normal non-labored breathing  Cardiac: regular rate and rhythm  Abdomen: soft, NT, no masses Skin: without rashes Vascular Exam/Pulses: palpable 2+ radial pulses  Extremities: without ischemic changes, without gangrene , without cellulitis; without open wounds;  Musculoskeletal: no muscle  wasting or atrophy  Neurologic: A&O X 3;  No focal weakness or paresthesias are detected Psychiatric:  The pt has Normal affect.  Non-Invasive Vascular Imaging   B Carotid Duplex (05/29/2022):  R ICA stenosis:  1-39% R VA:  patent and antegrade L ICA stenosis:  none, patent stent L VA:  patent and antegrade   Medical Decision Making   Steven [name] is a 88 y.o. male who presents for surveillance of carotid artery stenosis  Based on the patient's vascular studies, his carotid artery stenosis is unchanged bilaterally.  He has right ICA stenosis 1 to 39%.  He has no restenosis in the left ICA with a patent stent He denies any signs or symptoms of stroke.  He has palpable 2+ radial pulses He can continue his aspirin, Plavix, and statin. He will follow-up with our office in 1 year with repeat bilateral carotid duplex study    Vicente Serene PA-C [facility] Office: [facility] MD: Virl Cagey

## 2022-06-08 ENCOUNTER — Telehealth: Payer: Self-pay | Admitting: Hematology and Oncology

## 2022-06-08 NOTE — Telephone Encounter (Signed)
Called patient per provider PAL to reschedule 5/9 appointment. Patient rescheduled and notified.

## 2022-06-18 DIAGNOSIS — Z8673 Personal history of transient ischemic attack (TIA), and cerebral infarction without residual deficits: Secondary | ICD-10-CM | POA: Diagnosis not present

## 2022-06-18 DIAGNOSIS — E785 Hyperlipidemia, unspecified: Secondary | ICD-10-CM | POA: Diagnosis not present

## 2022-06-18 DIAGNOSIS — E1122 Type 2 diabetes mellitus with diabetic chronic kidney disease: Secondary | ICD-10-CM | POA: Diagnosis not present

## 2022-06-25 DIAGNOSIS — E1122 Type 2 diabetes mellitus with diabetic chronic kidney disease: Secondary | ICD-10-CM | POA: Diagnosis not present

## 2022-06-25 DIAGNOSIS — E785 Hyperlipidemia, unspecified: Secondary | ICD-10-CM | POA: Diagnosis not present

## 2022-06-25 DIAGNOSIS — Z8673 Personal history of transient ischemic attack (TIA), and cerebral infarction without residual deficits: Secondary | ICD-10-CM | POA: Diagnosis not present

## 2022-07-02 DIAGNOSIS — L821 Other seborrheic keratosis: Secondary | ICD-10-CM | POA: Diagnosis not present

## 2022-07-02 DIAGNOSIS — L570 Actinic keratosis: Secondary | ICD-10-CM | POA: Diagnosis not present

## 2022-07-02 DIAGNOSIS — L814 Other melanin hyperpigmentation: Secondary | ICD-10-CM | POA: Diagnosis not present

## 2022-07-02 DIAGNOSIS — Z85828 Personal history of other malignant neoplasm of skin: Secondary | ICD-10-CM | POA: Diagnosis not present

## 2022-07-02 DIAGNOSIS — Z86007 Personal history of in-situ neoplasm of skin: Secondary | ICD-10-CM | POA: Diagnosis not present

## 2022-07-11 ENCOUNTER — Other Ambulatory Visit: Payer: Self-pay

## 2022-07-11 ENCOUNTER — Inpatient Hospital Stay (HOSPITAL_COMMUNITY)
Admission: EM | Admit: 2022-07-11 | Discharge: 2022-07-14 | DRG: 392 | Disposition: A | Discharge status: Home / Self Care | Payer: Medicare Other | Attending: Internal Medicine | Admitting: Internal Medicine

## 2022-07-11 ENCOUNTER — Observation Stay (HOSPITAL_COMMUNITY): Payer: Medicare Other

## 2022-07-11 ENCOUNTER — Emergency Department (HOSPITAL_COMMUNITY): Payer: Medicare Other

## 2022-07-11 DIAGNOSIS — Z803 Family history of malignant neoplasm of breast: Secondary | ICD-10-CM | POA: Diagnosis not present

## 2022-07-11 DIAGNOSIS — R9431 Abnormal electrocardiogram [ECG] [EKG]: Secondary | ICD-10-CM | POA: Diagnosis not present

## 2022-07-11 DIAGNOSIS — A0811 Acute gastroenteropathy due to Norwalk agent: Principal | ICD-10-CM | POA: Diagnosis present

## 2022-07-11 DIAGNOSIS — Z79631 Long term (current) use of antimetabolite agent: Secondary | ICD-10-CM | POA: Diagnosis not present

## 2022-07-11 DIAGNOSIS — Z87442 Personal history of urinary calculi: Secondary | ICD-10-CM | POA: Diagnosis not present

## 2022-07-11 DIAGNOSIS — K047 Periapical abscess without sinus: Secondary | ICD-10-CM | POA: Diagnosis present

## 2022-07-11 DIAGNOSIS — Z800 Family history of malignant neoplasm of digestive organs: Secondary | ICD-10-CM | POA: Diagnosis not present

## 2022-07-11 DIAGNOSIS — Z833 Family history of diabetes mellitus: Secondary | ICD-10-CM

## 2022-07-11 DIAGNOSIS — Z9049 Acquired absence of other specified parts of digestive tract: Secondary | ICD-10-CM

## 2022-07-11 DIAGNOSIS — Z8673 Personal history of transient ischemic attack (TIA), and cerebral infarction without residual deficits: Secondary | ICD-10-CM | POA: Diagnosis not present

## 2022-07-11 DIAGNOSIS — E872 Acidosis, unspecified: Secondary | ICD-10-CM | POA: Diagnosis present

## 2022-07-11 DIAGNOSIS — Z1152 Encounter for screening for COVID-19: Secondary | ICD-10-CM | POA: Diagnosis not present

## 2022-07-11 DIAGNOSIS — D509 Iron deficiency anemia, unspecified: Secondary | ICD-10-CM | POA: Diagnosis not present

## 2022-07-11 DIAGNOSIS — R051 Acute cough: Secondary | ICD-10-CM | POA: Diagnosis present

## 2022-07-11 DIAGNOSIS — Z7982 Long term (current) use of aspirin: Secondary | ICD-10-CM | POA: Diagnosis not present

## 2022-07-11 DIAGNOSIS — E785 Hyperlipidemia, unspecified: Secondary | ICD-10-CM | POA: Diagnosis not present

## 2022-07-11 DIAGNOSIS — R531 Weakness: Secondary | ICD-10-CM | POA: Diagnosis not present

## 2022-07-11 DIAGNOSIS — E876 Hypokalemia: Secondary | ICD-10-CM | POA: Diagnosis not present

## 2022-07-11 DIAGNOSIS — M457 Ankylosing spondylitis of lumbosacral region: Secondary | ICD-10-CM | POA: Diagnosis not present

## 2022-07-11 DIAGNOSIS — Z96641 Presence of right artificial hip joint: Secondary | ICD-10-CM | POA: Diagnosis not present

## 2022-07-11 DIAGNOSIS — K529 Noninfective gastroenteritis and colitis, unspecified: Secondary | ICD-10-CM | POA: Diagnosis present

## 2022-07-11 DIAGNOSIS — Z886 Allergy status to analgesic agent status: Secondary | ICD-10-CM

## 2022-07-11 DIAGNOSIS — Z79899 Other long term (current) drug therapy: Secondary | ICD-10-CM

## 2022-07-11 DIAGNOSIS — Z87891 Personal history of nicotine dependence: Secondary | ICD-10-CM

## 2022-07-11 DIAGNOSIS — Z961 Presence of intraocular lens: Secondary | ICD-10-CM | POA: Diagnosis present

## 2022-07-11 DIAGNOSIS — R112 Nausea with vomiting, unspecified: Secondary | ICD-10-CM | POA: Diagnosis present

## 2022-07-11 DIAGNOSIS — E860 Dehydration: Secondary | ICD-10-CM | POA: Diagnosis not present

## 2022-07-11 DIAGNOSIS — Z8249 Family history of ischemic heart disease and other diseases of the circulatory system: Secondary | ICD-10-CM

## 2022-07-11 DIAGNOSIS — R6510 Systemic inflammatory response syndrome (SIRS) of non-infectious origin without acute organ dysfunction: Secondary | ICD-10-CM | POA: Diagnosis present

## 2022-07-11 DIAGNOSIS — Z7902 Long term (current) use of antithrombotics/antiplatelets: Secondary | ICD-10-CM

## 2022-07-11 DIAGNOSIS — Z743 Need for continuous supervision: Secondary | ICD-10-CM | POA: Diagnosis not present

## 2022-07-11 DIAGNOSIS — R1110 Vomiting, unspecified: Secondary | ICD-10-CM | POA: Diagnosis not present

## 2022-07-11 DIAGNOSIS — R197 Diarrhea, unspecified: Secondary | ICD-10-CM | POA: Diagnosis not present

## 2022-07-11 DIAGNOSIS — R110 Nausea: Secondary | ICD-10-CM | POA: Diagnosis not present

## 2022-07-11 DIAGNOSIS — R1111 Vomiting without nausea: Secondary | ICD-10-CM | POA: Diagnosis not present

## 2022-07-11 DIAGNOSIS — M459 Ankylosing spondylitis of unspecified sites in spine: Secondary | ICD-10-CM | POA: Diagnosis present

## 2022-07-11 LAB — CBC WITH DIFFERENTIAL/PLATELET
Abs Immature Granulocytes: 0.02 10*3/uL (ref 0.00–0.07)
Basophils Absolute: 0 10*3/uL (ref 0.0–0.1)
Basophils Relative: 0 %
Eosinophils Absolute: 0 10*3/uL (ref 0.0–0.5)
Eosinophils Relative: 0 %
HCT: 35 % — ABNORMAL LOW (ref 39.0–52.0)
Hemoglobin: 11.6 g/dL — ABNORMAL LOW (ref 13.0–17.0)
Immature Granulocytes: 0 %
Lymphocytes Relative: 4 %
Lymphs Abs: 0.3 10*3/uL — ABNORMAL LOW (ref 0.7–4.0)
MCH: 33.1 pg (ref 26.0–34.0)
MCHC: 33.1 g/dL (ref 30.0–36.0)
MCV: 100 fL (ref 80.0–100.0)
Monocytes Absolute: 0.7 10*3/uL (ref 0.1–1.0)
Monocytes Relative: 9 %
Neutro Abs: 7.1 10*3/uL (ref 1.7–7.7)
Neutrophils Relative %: 87 %
Platelets: 203 10*3/uL (ref 150–400)
RBC: 3.5 MIL/uL — ABNORMAL LOW (ref 4.22–5.81)
RDW: 13.7 % (ref 11.5–15.5)
WBC: 8.1 10*3/uL (ref 4.0–10.5)
nRBC: 0 % (ref 0.0–0.2)

## 2022-07-11 LAB — COMPREHENSIVE METABOLIC PANEL WITH GFR
ALT: 23 U/L (ref 0–44)
AST: 26 U/L (ref 15–41)
Albumin: 3.6 g/dL (ref 3.5–5.0)
Alkaline Phosphatase: 39 U/L (ref 38–126)
Anion gap: 9 (ref 5–15)
BUN: 33 mg/dL — ABNORMAL HIGH (ref 8–23)
CO2: 23 mmol/L (ref 22–32)
Calcium: 7.6 mg/dL — ABNORMAL LOW (ref 8.9–10.3)
Chloride: 101 mmol/L (ref 98–111)
Creatinine, Ser: 1.02 mg/dL (ref 0.61–1.24)
GFR, Estimated: >60 mL/min
Glucose, Bld: 137 mg/dL — ABNORMAL HIGH (ref 70–99)
Potassium: 3.6 mmol/L (ref 3.5–5.1)
Sodium: 133 mmol/L — ABNORMAL LOW (ref 135–145)
Total Bilirubin: 1.2 mg/dL (ref 0.3–1.2)
Total Protein: 7 g/dL (ref 6.5–8.1)

## 2022-07-11 LAB — URINALYSIS, W/ REFLEX TO CULTURE (INFECTION SUSPECTED)
Bacteria, UA: NONE SEEN
Bilirubin Urine: NEGATIVE
Glucose, UA: NEGATIVE mg/dL
Hgb urine dipstick: NEGATIVE
Ketones, ur: NEGATIVE mg/dL
Leukocytes,Ua: NEGATIVE
Nitrite: NEGATIVE
Protein, ur: NEGATIVE mg/dL
Specific Gravity, Urine: 1.016 (ref 1.005–1.030)
pH: 5 (ref 5.0–8.0)

## 2022-07-11 LAB — LACTIC ACID, PLASMA
Lactic Acid, Venous: 3.6 mmol/L (ref 0.5–1.9)
Lactic Acid, Venous: 3.1 mmol/L (ref 0.5–1.9)
Lactic Acid, Venous: 1.7 mmol/L (ref 0.5–1.9)

## 2022-07-11 LAB — PROTIME-INR
INR: 1.2 (ref 0.8–1.2)
Prothrombin Time: 15.1 seconds (ref 11.4–15.2)

## 2022-07-11 LAB — RESP PANEL BY RT-PCR (RSV, FLU A&B, COVID)  RVPGX2
Influenza A by PCR: NEGATIVE
Influenza B by PCR: NEGATIVE
Resp Syncytial Virus by PCR: NEGATIVE
SARS Coronavirus 2 by RT PCR: NEGATIVE

## 2022-07-11 LAB — BRAIN NATRIURETIC PEPTIDE: B Natriuretic Peptide: 142.7 pg/mL — ABNORMAL HIGH (ref 0.0–100.0)

## 2022-07-11 MED ORDER — ROSUVASTATIN CALCIUM 20 MG PO TABS
20.0000 mg | ORAL_TABLET | Freq: Every day | ORAL | Status: DC
  Administered 2022-07-11 – 2022-07-13 (×3): 20 mg via ORAL
  Filled 2022-07-11 (×3): qty 1

## 2022-07-11 MED ORDER — ACETAMINOPHEN 325 MG PO TABS
650.0000 mg | ORAL_TABLET | Freq: Four times a day (QID) | ORAL | Status: DC | PRN
  Administered 2022-07-12 – 2022-07-13 (×2): 650 mg via ORAL
  Filled 2022-07-11 (×2): qty 2

## 2022-07-11 MED ORDER — LACTATED RINGERS IV BOLUS
1000.0000 mL | Freq: Once | INTRAVENOUS | Status: AC
  Administered 2022-07-11: 1000 mL via INTRAVENOUS

## 2022-07-11 MED ORDER — SODIUM CHLORIDE 0.9 % IV BOLUS
1000.0000 mL | Freq: Once | INTRAVENOUS | Status: AC
  Administered 2022-07-11: 1000 mL via INTRAVENOUS

## 2022-07-11 MED ORDER — ONDANSETRON HCL 4 MG/2ML IJ SOLN: 4.0000 mg | Freq: Four times a day (QID) | INTRAMUSCULAR | Status: DC | PRN

## 2022-07-11 MED ORDER — SODIUM CHLORIDE 0.9 % IV SOLN
500.0000 mg | INTRAVENOUS | Status: DC
  Administered 2022-07-11: 500 mg via INTRAVENOUS
  Filled 2022-07-11: qty 5

## 2022-07-11 MED ORDER — SODIUM CHLORIDE 0.9 % IV SOLN: INTRAVENOUS | Status: AC

## 2022-07-11 MED ORDER — ONDANSETRON HCL 4 MG PO TABS
4.0000 mg | ORAL_TABLET | Freq: Four times a day (QID) | ORAL | Status: DC | PRN
  Administered 2022-07-11: 4 mg via ORAL
  Filled 2022-07-11: qty 1

## 2022-07-11 MED ORDER — SODIUM CHLORIDE 0.9 % IV SOLN
2.0000 g | INTRAVENOUS | Status: DC
  Administered 2022-07-11: 2 g via INTRAVENOUS
  Filled 2022-07-11: qty 20

## 2022-07-11 MED ORDER — ACETAMINOPHEN 325 MG PO TABS
650.0000 mg | ORAL_TABLET | Freq: Once | ORAL | Status: AC
  Administered 2022-07-11: 650 mg via ORAL
  Filled 2022-07-11: qty 2

## 2022-07-11 MED ORDER — ENOXAPARIN SODIUM 40 MG/0.4ML IJ SOSY
40.0000 mg | PREFILLED_SYRINGE | INTRAMUSCULAR | Status: DC
  Filled 2022-07-11 (×2): qty 0.4

## 2022-07-11 MED ORDER — ACETAMINOPHEN 650 MG RE SUPP: 650.0000 mg | Freq: Four times a day (QID) | RECTAL | Status: DC | PRN

## 2022-07-11 NOTE — H&P (Signed)
History and Physical    Steven [name] [id number] DOB: October 06, 1934 DOA: 07/11/2022  PCP: [name], MD  Patient coming from: Home  I have personally briefly reviewed patient's old medical records in [place]  Chief Complaint: Vomiting, diarrhea, weakness  HPI: Steven [name] is a 88 y.o. male with medical history significant for ankylosing spondylitis on methotrexate, TIA, CAS s/p left TCAR, iron deficiency anemia, HLD who presented to the ED for evaluation of vomiting, diarrhea, and weakness.  Patient states around 6 PM yesterday (3/29) he developed new onset of nausea, vomiting, and diarrhea.  This persisted until around 2 AM this morning.  Emesis was nonbloody and without coffee-ground appearance.  Diarrhea is described as loose stools without hematochezia or melena.  He has been feeling fatigued, generally weak, and dehydrated today.  He was trying to get out of bed to get to his walker today when he felt as if his legs could not support him and he fell.  The fall resulted in a carpet burn to his left elbow but no other significant injury.  He has not had any abdominal pain.  He reports an intermittent nonproductive cough recently which he thinks may be related to allergies/postnasal drip.  He denies any chest pain or dyspnea.  He has had chills and temperature when checked at home was in the 99 F range.  He does report having a root canal within the last month and a tooth infection.  He says he was prescribed 1 week of amoxicillin which he completed but was placed on another antibiotic afterwards due to persistent infection.  He is not sure what the name of this antibiotic is but says he had to stop it due to it causing upset stomach.  He says he has had a tooth extraction and is planned for a tooth implant soon and was told to hold his aspirin in Plavix by his dentist.  ED Course  Labs/Imaging on admission: I have personally reviewed following labs and imaging  studies.  Initial vitals showed BP 113/53, pulse 81, RR 20, temp 101.3 F rectally, SpO2 96% on room air.  Labs show WBC 8.1, hemoglobin 11.6, platelets 203,000, sodium 133, potassium 3.6, bicarb 23, BUN 33, creatinine 1.02, serum glucose 137, LFTs within normal limits, BNP 142.7.  Lactic acid 3.6.  COVID, influenza, RSV negative.  UA negative for UTI.  Blood cultures in process.  GI pathogen panel collected and pending.  2 view chest x-ray shows borderline cardiomegaly with nonspecific interstitial thickening.  No consolidation.  Patient was given 1 L LR, IV ceftriaxone and azithromycin.  The hospitalist service was consulted to admit for further evaluation and management.  Review of Systems: All systems reviewed and are negative except as documented in history of present illness above.   Past Medical History:  Diagnosis Date   Allergy    enviromental   Anemia 2017   Anxiety    Arthritis    ankylosing spodilitis   Blood transfusion without reported diagnosis    during surgery   Cataract    Depression    Dyspnea    with exertion - had Echo done 09/29/16   History of kidney stones    Hyperlipidemia    Intestinal obstruction ([place])    Pneumonia    as a child    Past Surgical History:  Procedure Laterality Date   ABDOMINAL SURGERY     had abcess   APPENDECTOMY     CHOLECYSTECTOMY  with lysis of adhesions   COLONOSCOPY     COLOSTOMY     COLOSTOMY REVERSAL     EYE SURGERY Left    scar tissue removed from cornea   EYE SURGERY Right    cataract surgery with lens implant   JOINT REPLACEMENT Right    hip  x 2 1999 and 2007   SHOULDER ARTHROSCOPY WITH ROTATOR CUFF REPAIR AND SUBACROMIAL DECOMPRESSION Left 03/02/2013   Procedure: LEFT SHOULDER ARTHROSCOPY WITH SUBACROMIAL DECOMPRESSION DISTAL CALVICLE RESECTION AND ROTATOR CUFF REPAIR ;  Surgeon: [name], MD;  Location: [place];  Service: Orthopedics;  Laterality: Left;   TOTAL HIP REVISION Right 11/06/2016    Procedure: TOTAL HIP REVISION OF THE ACETABULAR COMPONENT;  Surgeon: [name], MD;  Location: [place];  Service: Orthopedics;  Laterality: Right;   TRANSCAROTID ARTERY REVASCULARIZATION  Left 03/31/2021   Procedure: TRANSCAROTID ARTERY REVASCULARIZATION;  Surgeon: Broadus John, MD;  Location: [place];  Service: Vascular;  Laterality: Left;   ULTRASOUND GUIDANCE FOR VASCULAR ACCESS Right 03/31/2021   Procedure: ULTRASOUND GUIDANCE FOR VASCULAR ACCESS;  Surgeon: Broadus John, MD;  Location: [facility] OR;  Service: Vascular;  Laterality: Right;    Social History:  reports that he has quit smoking. He has never been exposed to tobacco smoke. He has never used smokeless tobacco. He reports current alcohol use. He reports that he does not use drugs.  Allergies  Allergen Reactions   Indomethacin Hives and Other (See Comments)   Lactose Intolerance (Gi)     GI UPSET    Family History  Problem Relation Age of Onset   Heart attack Mother    Diabetes Mother    Breast cancer Mother    Heart attack Maternal Grandfather    Pancreatic cancer Brother    Colon cancer Neg Hx    Esophageal cancer Neg Hx    Stomach cancer Neg Hx      Prior to Admission medications   Medication Sig Start Date End Date Taking? Authorizing Provider  Artificial Tear Solution (SOOTHE XP) SOLN Place 1 drop into both eyes 2 (two) times daily.    [provider]  aspirin EC 325 MG EC tablet Take 1 tablet (325 mg total) by mouth daily. 04/02/21   Hongalgi, Lenis Dickinson, MD  B Complex-C (B-COMPLEX WITH VITAMIN C) tablet Take 1 tablet by mouth daily.     [provider]  Biotin 5000 MCG TABS Take 1 tablet by mouth daily.    [provider]  Calcium Citrate 250 MG TABS Take 1 tablet by mouth daily.    [provider]  cholecalciferol (VITAMIN D) 1000 units tablet Take 1,000 Units by mouth daily.    [provider]  citalopram (CELEXA) 20 MG tablet Take 20 mg by mouth daily.      [provider]  clopidogrel (PLAVIX) 75 MG tablet Take 1 tablet (75 mg total) by mouth daily. 04/02/21   Hongalgi, Lenis Dickinson, MD  ferrous sulfate 325 (65 FE) MG tablet Take 1 tablet (325 mg total) by mouth daily with breakfast. 06/07/21 07/07/21  Elodia Florence., MD  folic acid (FOLVITE) 1 MG tablet Take 1 mg by mouth in the morning, at noon, and at bedtime.    [provider]  ipratropium (ATROVENT) 0.06 % nasal spray Place 2 sprays into both nostrils 2 (two) times daily as needed (allergies). 06/05/19   [provider]  methotrexate (RHEUMATREX) 2.5 MG tablet Take 20 mg by mouth  once a week. TAKE 8 TABLETS (20 MG) BY MOUTH ONCE A WEEK.    [provider]  Multiple Vitamins-Minerals (PRESERVISION AREDS 2 PO) Take 1 tablet by mouth in the morning and at bedtime.    [provider]  pantoprazole (PROTONIX) 40 MG tablet Take 1 tablet (40 mg total) by mouth daily. 04/02/21   Hongalgi, Lenis Dickinson, MD  rosuvastatin (CRESTOR) 20 MG tablet Take 1 tablet (20 mg total) by mouth at bedtime. 04/01/21   Hongalgi, Lenis Dickinson, MD  Wheat Dextrin (BENEFIBER) POWD Take 1 Scoop by mouth daily. 10/31/20   [provider]    Physical Exam: Vitals:   07/11/22 1630 07/11/22 1730 07/11/22 1745 07/11/22 1931  BP: (!) 119/49 (!) 108/53 106/76   Pulse: 80 76 78   Resp: (!) 29 (!) 30 (!) 29   Temp:    98 F (36.7 C)  TempSrc:    Oral  SpO2: 96% 95% 93%   Weight:      Height:       Constitutional: Resting in bed, NAD, calm, comfortable Eyes: EOMI, lids and conjunctivae normal ENMT: Mucous membranes are dry. Posterior pharynx clear of any exudate or lesions.Normal dentition.  Neck: normal, supple, no masses. Respiratory: clear to auscultation bilaterally, no wheezing, no crackles. Normal respiratory effort. No accessory muscle use.  Cardiovascular: Regular rate and rhythm, no murmurs / rubs / gallops. No extremity edema. 2+ pedal pulses. Abdomen: no tenderness, no  masses palpated.  Multiple well-healed surgical scars noted. Musculoskeletal: no clubbing / cyanosis. No joint deformity upper and lower extremities. Good ROM, no contractures. Normal muscle tone.  Skin: no rashes, lesions, ulcers. No induration Neurologic: Sensation intact. Strength 5/5 in all 4.  Psychiatric: Normal judgment and insight. Alert and oriented x 3. Normal mood.   EKG: Personally reviewed. Sinus rhythm, rate 80, first-degree AV block, RBBB.  Similar to prior.  Assessment/Plan Principal Problem:   SIRS (systemic inflammatory response syndrome) (HCC) Active Problems:   Nausea, vomiting, and diarrhea   Ankylosing spondylitis of lumbosacral region [facility])   History of CVA (cerebrovascular accident)   WENTWORTH ALLSTON is a 88 y.o. male with medical history significant for ankylosing spondylitis on methotrexate, TIA, CAS s/p left TCAR, iron deficiency anemia, HLD who is admitted with gastroenteritis.  Assessment and Plan: * SIRS (systemic inflammatory response syndrome) (HCC) Met sepsis criteria with fever and tachypnea.  Also noted to have elevated lactic acid.  Suspect this is secondary to gastroenteritis.  COVID, flu, RSV are negative.  UA negative for UTI.  CXR without evidence of pneumonia. -Hold further antibiotics Follow blood cultures -Continue IV fluid hydration  Nausea, vomiting, and diarrhea Suspect gastroenteritis.  He has had recent courses of antibiotics.  Abdomen is nontender. -Hold further antibiotics -Follow GI pathogen panel, add on C. difficile study -Check KUB -Continue IV fluid hydration overnight  History of CVA (cerebrovascular accident) He has been off his aspirin and Plavix per his dentist for recent and upcoming dental procedures.  Continue rosuvastatin.  Ankylosing spondylitis of lumbosacral region [facility]) He is on methotrexate weekly.  Hold for now.  DVT prophylaxis: enoxaparin (LOVENOX) injection 40 mg Start: 07/11/22 2200 Code Status: Full code,  confirmed with patient on admission Family Communication: Spouse at bedside Disposition Plan: From home and likely discharge to home pending clinical progress Consults called: None Severity of Illness: The appropriate patient status for this patient is OBSERVATION. Observation status is judged to be reasonable and necessary in order to provide the required  intensity of service to ensure the patient's safety. The patient's presenting symptoms, physical exam findings, and initial radiographic and laboratory data in the context of their medical condition is felt to place them at decreased risk for further clinical deterioration. Furthermore, it is anticipated that the patient will be medically stable for discharge from the hospital within 2 midnights of admission.   Zada Finders MD [facility]  If 7PM-7AM, please contact night-coverage [email or web address]  07/11/2022, 7:57 PM

## 2022-07-11 NOTE — Assessment & Plan Note (Signed)
He is on methotrexate weekly.

## 2022-07-11 NOTE — Plan of Care (Signed)
Discuss and review plan of care with patient/family  

## 2022-07-11 NOTE — Assessment & Plan Note (Signed)
He has been off his aspirin and Plavix per his dentist for recent and upcoming dental procedures.  Continue rosuvastatin.

## 2022-07-11 NOTE — Hospital Course (Signed)
Steven [name] is a 88 y.o. male with medical history significant for ankylosing spondylitis on methotrexate, TIA, CAS s/p left TCAR, iron deficiency anemia, HLD who is admitted with gastroenteritis.

## 2022-07-11 NOTE — Assessment & Plan Note (Signed)
Suspect gastroenteritis.  He has had recent courses of antibiotics.  Abdomen is nontender. -Hold further antibiotics -Follow GI pathogen panel, add on C. difficile study -Check KUB -Continue IV fluid hydration overnight

## 2022-07-11 NOTE — ED Provider Notes (Signed)
[facility] EMERGENCY DEPARTMENT AT [facility] Provider Note   CSN: [id number] Arrival date & time: 07/11/22  1501     History  Chief Complaint  Patient presents with   Emesis   Diarrhea   Fatigue   Fall    Steven [name] is a 88 y.o. male.  HPI 88 year old male presents with vomiting, diarrhea and weakness. He started having vomiting and diarrhea last night.  He states the vomiting seemed to have stopped around 1 AM.  He last had diarrhea this morning.  However he has been feeling diffusely weak, especially in his legs.  He feels like his legs cannot support him and he fell when he was getting out of his bed try to get to his walker.  The fall resulted in a carpet burn to his left elbow but no other significant injury.  He did not hit his head.  He does not really feel lightheaded although he thinks he felt a little lightheaded earlier.  He has some abdominal discomfort when he was vomiting but that is all gone.  He has been having a cough but no chest pain or shortness of breath.  No focal weakness or numbness.  No back pain.  Highest temp at home was in the 99 range but then here he has a temp of 101.  Home Medications Prior to Admission medications   Medication Sig Start Date End Date Taking? Authorizing Provider  Artificial Tear Solution (SOOTHE XP) SOLN Place 1 drop into both eyes 2 (two) times daily.    [provider]  aspirin EC 325 MG EC tablet Take 1 tablet (325 mg total) by mouth daily. 04/02/21   Hongalgi, Lenis Dickinson, MD  B Complex-C (B-COMPLEX WITH VITAMIN C) tablet Take 1 tablet by mouth daily.     [provider]  Biotin 5000 MCG TABS Take 1 tablet by mouth daily.    [provider]  Calcium Citrate 250 MG TABS Take 1 tablet by mouth daily.    [provider]  cholecalciferol (VITAMIN D) 1000 units tablet Take 1,000 Units by mouth daily.    [provider]  citalopram (CELEXA) 20 MG tablet Take 20 mg by mouth daily.      [provider]  clopidogrel (PLAVIX) 75 MG tablet Take 1 tablet (75 mg total) by mouth daily. 04/02/21   Hongalgi, Lenis Dickinson, MD  ferrous sulfate 325 (65 FE) MG tablet Take 1 tablet (325 mg total) by mouth daily with breakfast. 06/07/21 07/07/21  Elodia Florence., MD  folic acid (FOLVITE) 1 MG tablet Take 1 mg by mouth in the morning, at noon, and at bedtime.    [provider]  ipratropium (ATROVENT) 0.06 % nasal spray Place 2 sprays into both nostrils 2 (two) times daily as needed (allergies). 06/05/19   [provider]  methotrexate (RHEUMATREX) 2.5 MG tablet Take 20 mg by mouth once a week. TAKE 8 TABLETS (20 MG) BY MOUTH ONCE A WEEK.    [provider]  Multiple Vitamins-Minerals (PRESERVISION AREDS 2 PO) Take 1 tablet by mouth in the morning and at bedtime.    [provider]  pantoprazole (PROTONIX) 40 MG tablet Take 1 tablet (40 mg total) by mouth daily. 04/02/21   Hongalgi, Lenis Dickinson, MD  rosuvastatin (CRESTOR) 20 MG tablet Take 1 tablet (20 mg total) by mouth at bedtime. 04/01/21   Hongalgi, Lenis Dickinson, MD  Wheat Dextrin (BENEFIBER) POWD Take 1 Scoop by  mouth daily. 10/31/20   [provider]      Allergies    Indomethacin and Lactose intolerance (gi)    Review of Systems   Review of Systems  Constitutional:  Positive for fever.  Respiratory:  Positive for cough. Negative for shortness of breath.   Cardiovascular:  Negative for chest pain.  Gastrointestinal:  Positive for diarrhea, nausea and vomiting. Negative for abdominal pain.  Musculoskeletal:  Negative for back pain.  Neurological:  Positive for weakness. Negative for numbness and headaches.    Physical Exam Updated Vital Signs BP (!) 106/52 (BP Location: Right Arm)   Pulse 75   Temp 97.9 F (36.6 C) (Oral)   Resp 20   Ht 5\' 7"  (1.702 m)   Wt 61.2 kg   SpO2 93%   BMI 21.13 kg/m  Physical Exam Vitals and nursing note reviewed.  Constitutional:      Appearance:  He is well-developed. He is not diaphoretic.  HENT:     Head: Normocephalic and atraumatic.     Mouth/Throat:     Mouth: Mucous membranes are dry.  Cardiovascular:     Rate and Rhythm: Normal rate and regular rhythm.     Heart sounds: Normal heart sounds.  Pulmonary:     Effort: Pulmonary effort is normal.     Comments: Mild crackles bilaterally Abdominal:     General: There is no distension.     Palpations: Abdomen is soft.     Tenderness: There is no abdominal tenderness.  Skin:    General: Skin is warm and dry.  Neurological:     Mental Status: He is alert.     Comments: CN 3-12 grossly intact. 5/5 strength in all 4 extremities. Grossly normal sensation. Normal finger to nose.      ED Results / Procedures / Treatments   Labs (all labs ordered are listed, but only abnormal results are displayed) Labs Reviewed  COMPREHENSIVE METABOLIC PANEL - Abnormal; Notable for the following components:      Result Value   Sodium 133 (*)    Glucose, Bld 137 (*)    BUN 33 (*)    Calcium 7.6 (*)    All other components within normal limits  LACTIC ACID, PLASMA - Abnormal; Notable for the following components:   Lactic Acid, Venous 3.6 (*)    All other components within normal limits  LACTIC ACID, PLASMA - Abnormal; Notable for the following components:   Lactic Acid, Venous 3.1 (*)    All other components within normal limits  CBC WITH DIFFERENTIAL/PLATELET - Abnormal; Notable for the following components:   RBC 3.50 (*)    Hemoglobin 11.6 (*)    HCT 35.0 (*)    Lymphs Abs 0.3 (*)    All other components within normal limits  BRAIN NATRIURETIC PEPTIDE - Abnormal; Notable for the following components:   B Natriuretic Peptide 142.7 (*)    All other components within normal limits  RESP PANEL BY RT-PCR (RSV, FLU A&B, COVID)  RVPGX2  CULTURE, BLOOD (ROUTINE X 2)  CULTURE, BLOOD (ROUTINE X 2)  GASTROINTESTINAL PANEL BY PCR, STOOL (REPLACES STOOL CULTURE)  C DIFFICILE QUICK SCREEN W  PCR REFLEX    URINALYSIS, W/ REFLEX TO CULTURE (INFECTION SUSPECTED)  PROTIME-INR  LACTIC ACID, PLASMA  CBC  BASIC METABOLIC PANEL    EKG EKG Interpretation  Date/Time:  Saturday July 11 2022 15:18:21 EDT Ventricular Rate:  80 PR Interval:  242 QRS Duration: 138 QT Interval:  417 QTC  Calculation: 482 R Axis:   71 Text Interpretation: Sinus rhythm Prolonged PR interval IVCD, consider atypical RBBB no acute ST/T changes overall similar to May 2023 Confirmed by Sherwood Gambler [phone]) on 07/11/2022 3:24:48 PM  Radiology DG Abd 1 View  Result Date: 07/11/2022 CLINICAL DATA:  Vomiting and diarrhea EXAM: ABDOMEN - 1 VIEW COMPARISON:  11/04/2019, 02/28/2020 FINDINGS: Supine frontal view of the abdomen and pelvis was obtained, excluding the left flank and pubic symphysis by collimation. Nonobstructive bowel gas pattern. Gas is seen throughout the colon to the level of the rectum. No masses or abnormal calcifications. Stable findings of ankylosing spondylitis. Right hip arthroplasty. IMPRESSION: 1. Unremarkable bowel gas pattern. 2. Stable bony changes of ankylosing spondylitis. Electronically Signed   By: Randa Ngo M.D.   On: 07/11/2022 20:19   DG Chest 2 View  Result Date: 07/11/2022 CLINICAL DATA:  Weakness.  Possible sepsis. EXAM: CHEST - 2 VIEW COMPARISON:  06/03/2021 FINDINGS: Probable ankylosing spondylitis as evidenced by flowing syndesmophytes throughout the thoracic spine. Numerous leads and wires project over the chest on the frontal. Midline trachea. Borderline cardiomegaly. No pleural effusion or pneumothorax. Nonspecific mild interstitial thickening. No lobar consolidation. Widening of left acromioclavicular joint may be postoperative. IMPRESSION: Borderline cardiomegaly with interstitial thickening which is nonspecific. This could represent mild pulmonary venous congestion. No overt congestive failure. No lobar consolidation to explain sepsis. Electronically Signed   By: Abigail Miyamoto M.D.   On: 07/11/2022 16:50    Procedures .Critical Care  Performed by: Sherwood Gambler, MD Authorized by: Sherwood Gambler, MD   Critical care provider statement:    Critical care time (minutes):  35   Critical care time was exclusive of:  Separately billable procedures and treating other patients   Critical care was necessary to treat or prevent imminent or life-threatening deterioration of the following conditions:  Sepsis   Critical care was time spent personally by me on the following activities:  Development of treatment plan with patient or surrogate, discussions with consultants, evaluation of patient's response to treatment, examination of patient, ordering and review of laboratory studies, ordering and review of radiographic studies, ordering and performing treatments and interventions, pulse oximetry, re-evaluation of patient's condition and review of old charts     Medications Ordered in ED Medications  sodium chloride 0.9 % bolus 1,000 mL (1,000 mLs Intravenous New Bag/Given 07/11/22 2154)    Followed by  0.9 %  sodium chloride infusion (has no administration in time range)  enoxaparin (LOVENOX) injection 40 mg (40 mg Subcutaneous Patient Refused/Not Given 07/11/22 2309)  acetaminophen (TYLENOL) tablet 650 mg (has no administration in time range)    Or  acetaminophen (TYLENOL) suppository 650 mg (has no administration in time range)  ondansetron (ZOFRAN) tablet 4 mg (4 mg Oral Given 07/11/22 2154)    Or  ondansetron (ZOFRAN) injection 4 mg ( Intravenous See Alternative 07/11/22 2154)  rosuvastatin (CRESTOR) tablet 20 mg (20 mg Oral Given 07/11/22 2154)  lactated ringers bolus 1,000 mL (0 mLs Intravenous Stopped 07/11/22 1720)  acetaminophen (TYLENOL) tablet 650 mg (650 mg Oral Given 07/11/22 1554)    ED Course/ Medical Decision Making/ A&P Clinical Course as of 07/11/22 2329  Sat Jul 11, 2022  1659 Lactate is elevated at 3.  He does have cough with some crackles and so  he will be treated for possible pneumonia.  Could still be GI/viral but given his weakness and her symptoms with a lactate I think covering with antibiotics for now is reasonable. [  SG]    Clinical Course User Index [SG] Sherwood Gambler, MD                             Medical Decision Making Amount and/or Complexity of Data Reviewed Labs: ordered.    Details: Elevated lactate of 3.  Normal WBC but does have dohle bodies. Radiology: ordered and independent interpretation performed.    Details: Possible pneumonitis.  I think CHF is less likely ECG/medicine tests: ordered and independent interpretation performed.  Risk OTC drugs. Decision regarding hospitalization.   Patient was given IV fluids.  After his lactate, he was started on IV antibiotics as above.  He does have some crackles so I think pneumonia is possible with his cough in addition to his GI symptoms.  He does continue to feel weak and I do not think he is stable enough to go home.  It is unclear if this is truly sepsis but I think covering with antibiotics for now and assessing response is reasonable.  Discussed with Dr. Posey Pronto who will admit.        Final Clinical Impression(s) / ED Diagnoses Final diagnoses:  Lactic acidosis    Rx / DC Orders ED Discharge Orders     None         Sherwood Gambler, MD 07/11/22 2329

## 2022-07-11 NOTE — Assessment & Plan Note (Signed)
Met sepsis criteria with fever and tachypnea.  Also noted to have elevated lactic acid.  Suspect this is secondary to gastroenteritis.  COVID, flu, RSV are negative.  UA negative for UTI.  CXR without evidence of pneumonia. -Hold further antibiotics Follow blood cultures -Continue IV fluid hydration

## 2022-07-11 NOTE — ED Triage Notes (Signed)
Pt BIBA from home. C/o N/V/V beginning at 1800 yesterday. Reports falling today d/t weakness and couldn't stand (No c/o pain from fall). No LOC. Pt stopped blood thinners two weeks ago.  Aox4  Given 500 NS by EMS  BP: 120/60 HR: 82 SPO2: 98 RA

## 2022-07-11 NOTE — ED Notes (Signed)
ED TO INPATIENT HANDOFF REPORT  ED Nurse Name and Phone #: Caryl Comes, RN  S Name/Age/Gender Steven [name] 88 y.o. male Room/Bed: WA21/WA21  Code Status   Code Status: Prior  Home/SNF/Other Home Patient oriented to: self, place, time, and situation Is this baseline? Yes   Triage Complete: Triage complete  Chief Complaint SIRS (systemic inflammatory response syndrome) [R65.10]  Triage Note Pt BIBA from home. C/o N/V/V beginning at 1800 yesterday. Reports falling today d/t weakness and couldn't stand (No c/o pain from fall). No LOC. Pt stopped blood thinners two weeks ago.  Aox4  Given 500 NS by EMS  BP: 120/60 HR: 82 SPO2: 98 RA    Allergies Allergies  Allergen Reactions   Indomethacin Hives and Other (See Comments)   Lactose Intolerance (Gi)     GI UPSET    Level of Care/Admitting Diagnosis ED Disposition     ED Disposition  Admit   Condition  --   Comment  Hospital Area: [facility] [id number]  Level of Care: Med-Surg [16]  May place patient in observation at [facility] or [place] if equivalent level of care is available:: No  Covid Evaluation: Confirmed COVID Negative  Diagnosis: SIRS (systemic inflammatory response syndrome) [id number]  Admitting Physician: Lenore Cordia [id number]  Attending Physician: Lenore Cordia [id number]          B Medical/Surgery History Past Medical History:  Diagnosis Date   Allergy    enviromental   Anemia 2017   Anxiety    Arthritis    ankylosing spodilitis   Blood transfusion without reported diagnosis    during surgery   Cataract    Depression    Dyspnea    with exertion - had Echo done 09/29/16   History of kidney stones    Hyperlipidemia    Intestinal obstruction ([place])    Pneumonia    as a child   Past Surgical History:  Procedure Laterality Date   ABDOMINAL SURGERY     had abcess   APPENDECTOMY     CHOLECYSTECTOMY     with lysis of adhesions   COLONOSCOPY     COLOSTOMY      COLOSTOMY REVERSAL     EYE SURGERY Left    scar tissue removed from cornea   EYE SURGERY Right    cataract surgery with lens implant   JOINT REPLACEMENT Right    hip  x 2 1999 and 2007   SHOULDER ARTHROSCOPY WITH ROTATOR CUFF REPAIR AND SUBACROMIAL DECOMPRESSION Left 03/02/2013   Procedure: LEFT SHOULDER ARTHROSCOPY WITH SUBACROMIAL DECOMPRESSION DISTAL CALVICLE RESECTION AND ROTATOR CUFF REPAIR ;  Surgeon: [name], MD;  Location: [place];  Service: Orthopedics;  Laterality: Left;   TOTAL HIP REVISION Right 11/06/2016   Procedure: TOTAL HIP REVISION OF THE ACETABULAR COMPONENT;  Surgeon: [name], MD;  Location: [place];  Service: Orthopedics;  Laterality: Right;   TRANSCAROTID ARTERY REVASCULARIZATION  Left 03/31/2021   Procedure: TRANSCAROTID ARTERY REVASCULARIZATION;  Surgeon: Broadus John, MD;  Location: [place];  Service: Vascular;  Laterality: Left;   ULTRASOUND GUIDANCE FOR VASCULAR ACCESS Right 03/31/2021   Procedure: ULTRASOUND GUIDANCE FOR VASCULAR ACCESS;  Surgeon: Broadus John, MD;  Location: [facility];  Service: Vascular;  Laterality: Right;     A IV Location/Drains/Wounds Patient Lines/Drains/Airways Status     Active Line/Drains/Airways     Name Placement date Placement time Site Days   Peripheral IV 07/11/22 20 G Anterior;Distal;Right Forearm 07/11/22  1515  Forearm  less than 1   Peripheral IV 07/11/22 22 G Left Antecubital 07/11/22  1600  Antecubital  less than 1            Intake/Output Last 24 hours  Intake/Output Summary (Last 24 hours) at 07/11/2022 1921 Last data filed at 07/11/2022 1800 Gross per 24 hour  Intake 1600 ml  Output --  Net 1600 ml    Labs/Imaging Results for orders placed or performed during the hospital encounter of 07/11/22 (from the past 48 hour(s))  Comprehensive metabolic panel     Status: Abnormal   Collection Time: 07/11/22  3:40 PM  Result Value Ref Range   Sodium 133 (L) 135 - 145 mmol/L   Potassium 3.6 3.5 - 5.1  mmol/L   Chloride 101 98 - 111 mmol/L   CO2 23 22 - 32 mmol/L   Glucose, Bld 137 (H) 70 - 99 mg/dL    Comment: Glucose reference range applies only to samples taken after fasting for at least 8 hours.   BUN 33 (H) 8 - 23 mg/dL   Creatinine, Ser 1.02 0.61 - 1.24 mg/dL   Calcium 7.6 (L) 8.9 - 10.3 mg/dL   Total Protein 7.0 6.5 - 8.1 g/dL   Albumin 3.6 3.5 - 5.0 g/dL   AST 26 15 - 41 U/L   ALT 23 0 - 44 U/L   Alkaline Phosphatase 39 38 - 126 U/L   Total Bilirubin 1.2 0.3 - 1.2 mg/dL   GFR, Estimated >60 >60 mL/min    Comment: (NOTE) Calculated using the CKD-EPI Creatinine Equation (2021)    Anion gap 9 5 - 15    Comment: Performed at [facility], [place] [address] [place], [place] [address]  Lactic acid, plasma     Status: Abnormal   Collection Time: 07/11/22  3:40 PM  Result Value Ref Range   Lactic Acid, Venous 3.6 (HH) 0.5 - 1.9 mmol/L    Comment: CRITICAL RESULT CALLED TO, READ BACK BY AND VERIFIED WITH [place], K @ [id number] ON 07/11/2022 BY Gloriann Loan Performed at [facility], [place] [address], [place], [place] [address]   CBC with Differential     Status: Abnormal   Collection Time: 07/11/22  3:40 PM  Result Value Ref Range   WBC 8.1 4.0 - 10.5 K/uL   RBC 3.50 (L) 4.22 - 5.81 MIL/uL   Hemoglobin 11.6 (L) 13.0 - 17.0 g/dL   HCT 35.0 (L) 39.0 - 52.0 %   MCV 100.0 80.0 - 100.0 fL   MCH 33.1 26.0 - 34.0 pg   MCHC 33.1 30.0 - 36.0 g/dL   RDW 13.7 11.5 - 15.5 %   Platelets 203 150 - 400 K/uL   nRBC 0.0 0.0 - 0.2 %   Neutrophils Relative % 87 %   Neutro Abs 7.1 1.7 - 7.7 K/uL   Lymphocytes Relative 4 %   Lymphs Abs 0.3 (L) 0.7 - 4.0 K/uL   Monocytes Relative 9 %   Monocytes Absolute 0.7 0.1 - 1.0 K/uL   Eosinophils Relative 0 %   Eosinophils Absolute 0.0 0.0 - 0.5 K/uL   Basophils Relative 0 %   Basophils Absolute 0.0 0.0 - 0.1 K/uL   WBC Morphology DOHLE BODIES    Immature Granulocytes 0 %   Abs Immature Granulocytes 0.02 0.00 - 0.07  K/uL   Polychromasia PRESENT    Ovalocytes PRESENT     Comment: Performed at [facility], [place]  [address] [place], [place] [address]  Protime-INR     Status: None   Collection Time: 07/11/22  3:40 PM  Result Value Ref Range   Prothrombin Time 15.1 11.4 - 15.2 seconds   INR 1.2 0.8 - 1.2    Comment: (NOTE) INR goal varies based on device and disease states. Performed at [facility], [place] [address], [place], [place] [address]   Brain natriuretic peptide     Status: Abnormal   Collection Time: 07/11/22  3:40 PM  Result Value Ref Range   B Natriuretic Peptide 142.7 (H) 0.0 - 100.0 pg/mL    Comment: Performed at [facility], [place] [address] [place], [place] [address]  Urinalysis, w/ Reflex to Culture (Infection Suspected) -Urine, Clean Catch     Status: None   Collection Time: 07/11/22  5:00 PM  Result Value Ref Range   Specimen Source URINE, CLEAN CATCH    Color, Urine YELLOW YELLOW   APPearance CLEAR CLEAR   Specific Gravity, Urine 1.016 1.005 - 1.030   pH 5.0 5.0 - 8.0   Glucose, UA NEGATIVE NEGATIVE mg/dL   Hgb urine dipstick NEGATIVE NEGATIVE   Bilirubin Urine NEGATIVE NEGATIVE   Ketones, ur NEGATIVE NEGATIVE mg/dL   Protein, ur NEGATIVE NEGATIVE mg/dL   Nitrite NEGATIVE NEGATIVE   Leukocytes,Ua NEGATIVE NEGATIVE   RBC / HPF 0-5 0 - 5 RBC/hpf   WBC, UA 0-5 0 - 5 WBC/hpf    Comment:        Reflex urine culture not performed if WBC <=10, OR if Squamous epithelial cells >5. If Squamous epithelial cells >5 suggest recollection.    Bacteria, UA NONE SEEN NONE SEEN   Squamous Epithelial / HPF 0-5 0 - 5 /HPF   Mucus PRESENT    Hyaline Casts, UA PRESENT     Comment: Performed at [facility], [place] [address], [place], [place] [address]  Resp panel by RT-PCR (RSV, Flu A&B, Covid) Anterior Nasal Swab     Status: None   Collection Time: 07/11/22  5:13 PM   Specimen: Anterior Nasal Swab  Result Value  Ref Range   SARS Coronavirus 2 by RT PCR NEGATIVE NEGATIVE    Comment: (NOTE) SARS-CoV-2 target nucleic acids are NOT DETECTED.  The SARS-CoV-2 RNA is generally detectable in upper respiratory specimens during the acute phase of infection. The lowest concentration of SARS-CoV-2 viral copies this assay can detect is 138 copies/mL. A negative result does not preclude SARS-Cov-2 infection and should not be used as the sole basis for treatment or other patient management decisions. A negative result may occur with  improper specimen collection/handling, submission of specimen other than nasopharyngeal swab, presence of viral mutation(s) within the areas targeted by this assay, and inadequate number of viral copies(<138 copies/mL). A negative result must be combined with clinical observations, patient history, and epidemiological information. The expected result is Negative.  Fact Sheet for Patients:  EntrepreneurPulse.com.au  Fact Sheet for Healthcare Providers:  IncredibleEmployment.be  This test is no t yet approved or cleared by the [place] FDA and  has been authorized for detection and/or diagnosis of SARS-CoV-2 by FDA under an Emergency Use Authorization (EUA). This EUA will remain  in effect (meaning this test can be used) for the duration of the COVID-19 declaration under Section 564(b)(1) of the Act, 21 U.S.C.section 360bbb-3(b)(1), unless the authorization is terminated  or revoked sooner.       Influenza A by PCR NEGATIVE NEGATIVE  Influenza B by PCR NEGATIVE NEGATIVE    Comment: (NOTE) The Xpert Xpress SARS-CoV-2/FLU/RSV plus assay is intended as an aid in the diagnosis of influenza from Nasopharyngeal swab specimens and should not be used as a sole basis for treatment. Nasal washings and aspirates are unacceptable for Xpert Xpress SARS-CoV-2/FLU/RSV testing.  Fact Sheet for  Patients: EntrepreneurPulse.com.au  Fact Sheet for Healthcare Providers: IncredibleEmployment.be  This test is not yet approved or cleared by the [place] FDA and has been authorized for detection and/or diagnosis of SARS-CoV-2 by FDA under an Emergency Use Authorization (EUA). This EUA will remain in effect (meaning this test can be used) for the duration of the COVID-19 declaration under Section 564(b)(1) of the Act, 21 U.S.C. section 360bbb-3(b)(1), unless the authorization is terminated or revoked.     Resp Syncytial Virus by PCR NEGATIVE NEGATIVE    Comment: (NOTE) Fact Sheet for Patients: EntrepreneurPulse.com.au  Fact Sheet for Healthcare Providers: IncredibleEmployment.be  This test is not yet approved or cleared by the [place] FDA and has been authorized for detection and/or diagnosis of SARS-CoV-2 by FDA under an Emergency Use Authorization (EUA). This EUA will remain in effect (meaning this test can be used) for the duration of the COVID-19 declaration under Section 564(b)(1) of the Act, 21 U.S.C. section 360bbb-3(b)(1), unless the authorization is terminated or revoked.  Performed at [facility], [place] [address] [place], [place] [address]   Lactic acid, plasma     Status: Abnormal   Collection Time: 07/11/22  5:45 PM  Result Value Ref Range   Lactic Acid, Venous 3.1 (HH) 0.5 - 1.9 mmol/L    Comment: CRITICAL VALUE NOTED. VALUE IS CONSISTENT WITH PREVIOUSLY REPORTED/CALLED VALUE Performed at [facility] [address] [place], [place] [address]    DG Chest 2 View  Result Date: 07/11/2022 CLINICAL DATA:  Weakness.  Possible sepsis. EXAM: CHEST - 2 VIEW COMPARISON:  06/03/2021 FINDINGS: Probable ankylosing spondylitis as evidenced by flowing syndesmophytes throughout the thoracic spine. Numerous leads and wires project over the chest on the  frontal. Midline trachea. Borderline cardiomegaly. No pleural effusion or pneumothorax. Nonspecific mild interstitial thickening. No lobar consolidation. Widening of left acromioclavicular joint may be postoperative. IMPRESSION: Borderline cardiomegaly with interstitial thickening which is nonspecific. This could represent mild pulmonary venous congestion. No overt congestive failure. No lobar consolidation to explain sepsis. Electronically Signed   By: Abigail Miyamoto M.D.   On: 07/11/2022 16:50    Pending Labs Unresulted Labs (From admission, onward)     Start     Ordered   07/11/22 1546  Gastrointestinal Panel by PCR , Stool  (Gastrointestinal Panel by PCR, Stool                                                                                                                                                     **  Does Not include CLOSTRIDIUM DIFFICILE testing. **If CDIFF testing is needed, place order from the "C Difficile Testing" order set.**)  Once,   URGENT        07/11/22 1545   07/11/22 1524  Culture, blood (Routine x 2)  BLOOD CULTURE X 2,   R (with STAT occurrences)      07/11/22 1524            Vitals/Pain Today's Vitals   07/11/22 1600 07/11/22 1630 07/11/22 1730 07/11/22 1745  BP: (!) 113/53 (!) 119/49 (!) 108/53 106/76  Pulse: 80 80 76 78  Resp: 20 (!) 29 (!) 30 (!) 29  Temp:      TempSrc:      SpO2: 96% 96% 95% 93%  Weight:      Height:      PainSc:        Isolation Precautions No active isolations  Medications Medications  cefTRIAXone (ROCEPHIN) 2 g in sodium chloride 0.9 % 100 mL IVPB (0 g Intravenous Stopped 07/11/22 1800)  azithromycin (ZITHROMAX) 500 mg in sodium chloride 0.9 % 250 mL IVPB (0 mg Intravenous Stopped 07/11/22 1920)  lactated ringers bolus 1,000 mL (0 mLs Intravenous Stopped 07/11/22 1720)  acetaminophen (TYLENOL) tablet 650 mg (650 mg Oral Given 07/11/22 1554)    Mobility walks with person assist     Focused Assessments Neuro Assessment  Handoff:  Swallow screen pass? Yes  Cardiac Rhythm: Normal sinus rhythm       Neuro Assessment:   Neuro Checks:      Has TPA been given? No If patient is a Neuro Trauma and patient is going to OR before floor call report to [place] nurse: [phone] or [phone]   R Recommendations: See Admitting Provider Note  Report given to:   Additional Notes:   Treat with antibiotics and monitor labs

## 2022-07-11 NOTE — ED Notes (Signed)
Pt stood and ambulated to the foot of the bed unassisted

## 2022-07-12 DIAGNOSIS — Z79899 Other long term (current) drug therapy: Secondary | ICD-10-CM | POA: Diagnosis not present

## 2022-07-12 DIAGNOSIS — R6510 Systemic inflammatory response syndrome (SIRS) of non-infectious origin without acute organ dysfunction: Secondary | ICD-10-CM | POA: Diagnosis present

## 2022-07-12 DIAGNOSIS — K529 Noninfective gastroenteritis and colitis, unspecified: Secondary | ICD-10-CM

## 2022-07-12 DIAGNOSIS — Z7982 Long term (current) use of aspirin: Secondary | ICD-10-CM | POA: Diagnosis not present

## 2022-07-12 DIAGNOSIS — Z87442 Personal history of urinary calculi: Secondary | ICD-10-CM | POA: Diagnosis not present

## 2022-07-12 DIAGNOSIS — Z8673 Personal history of transient ischemic attack (TIA), and cerebral infarction without residual deficits: Secondary | ICD-10-CM | POA: Diagnosis not present

## 2022-07-12 DIAGNOSIS — Z79631 Long term (current) use of antimetabolite agent: Secondary | ICD-10-CM | POA: Diagnosis not present

## 2022-07-12 DIAGNOSIS — Z800 Family history of malignant neoplasm of digestive organs: Secondary | ICD-10-CM | POA: Diagnosis not present

## 2022-07-12 DIAGNOSIS — Z8249 Family history of ischemic heart disease and other diseases of the circulatory system: Secondary | ICD-10-CM | POA: Diagnosis not present

## 2022-07-12 DIAGNOSIS — R051 Acute cough: Secondary | ICD-10-CM | POA: Diagnosis present

## 2022-07-12 DIAGNOSIS — Z7902 Long term (current) use of antithrombotics/antiplatelets: Secondary | ICD-10-CM | POA: Diagnosis not present

## 2022-07-12 DIAGNOSIS — Z87891 Personal history of nicotine dependence: Secondary | ICD-10-CM | POA: Diagnosis not present

## 2022-07-12 DIAGNOSIS — E876 Hypokalemia: Secondary | ICD-10-CM

## 2022-07-12 DIAGNOSIS — Z96641 Presence of right artificial hip joint: Secondary | ICD-10-CM | POA: Diagnosis present

## 2022-07-12 DIAGNOSIS — Z803 Family history of malignant neoplasm of breast: Secondary | ICD-10-CM | POA: Diagnosis not present

## 2022-07-12 DIAGNOSIS — Z1152 Encounter for screening for COVID-19: Secondary | ICD-10-CM | POA: Diagnosis not present

## 2022-07-12 DIAGNOSIS — M457 Ankylosing spondylitis of lumbosacral region: Secondary | ICD-10-CM | POA: Diagnosis present

## 2022-07-12 DIAGNOSIS — Z833 Family history of diabetes mellitus: Secondary | ICD-10-CM | POA: Diagnosis not present

## 2022-07-12 DIAGNOSIS — K047 Periapical abscess without sinus: Secondary | ICD-10-CM | POA: Diagnosis present

## 2022-07-12 DIAGNOSIS — E785 Hyperlipidemia, unspecified: Secondary | ICD-10-CM | POA: Diagnosis present

## 2022-07-12 DIAGNOSIS — A0811 Acute gastroenteropathy due to Norwalk agent: Secondary | ICD-10-CM | POA: Diagnosis present

## 2022-07-12 DIAGNOSIS — E860 Dehydration: Secondary | ICD-10-CM | POA: Diagnosis present

## 2022-07-12 DIAGNOSIS — E872 Acidosis, unspecified: Secondary | ICD-10-CM | POA: Diagnosis present

## 2022-07-12 DIAGNOSIS — D509 Iron deficiency anemia, unspecified: Secondary | ICD-10-CM | POA: Diagnosis present

## 2022-07-12 LAB — GASTROINTESTINAL PANEL BY PCR, STOOL (REPLACES STOOL CULTURE)

## 2022-07-12 LAB — CBC
HCT: 30.2 % — ABNORMAL LOW (ref 39.0–52.0)
Hemoglobin: 9.8 g/dL — ABNORMAL LOW (ref 13.0–17.0)
MCH: 33.1 pg (ref 26.0–34.0)
MCHC: 32.5 g/dL (ref 30.0–36.0)
MCV: 102 fL — ABNORMAL HIGH (ref 80.0–100.0)
Platelets: 157 10*3/uL (ref 150–400)
RBC: 2.96 MIL/uL — ABNORMAL LOW (ref 4.22–5.81)
RDW: 13.7 % (ref 11.5–15.5)
WBC: 6.6 10*3/uL (ref 4.0–10.5)
nRBC: 0 % (ref 0.0–0.2)

## 2022-07-12 LAB — C DIFFICILE QUICK SCREEN W PCR REFLEX
C Diff antigen: NEGATIVE
C Diff interpretation: NOT DETECTED
C Diff toxin: NEGATIVE

## 2022-07-12 LAB — BASIC METABOLIC PANEL
Anion gap: 8 (ref 5–15)
BUN: 23 mg/dL (ref 8–23)
CO2: 20 mmol/L — ABNORMAL LOW (ref 22–32)
Calcium: 7.3 mg/dL — ABNORMAL LOW (ref 8.9–10.3)
Chloride: 107 mmol/L (ref 98–111)
Creatinine, Ser: 0.79 mg/dL (ref 0.61–1.24)
GFR, Estimated: >60 mL/min (ref >60)
Glucose, Bld: 88 mg/dL (ref 70–99)
Potassium: 2.8 mmol/L — ABNORMAL LOW (ref 3.5–5.1)
Sodium: 135 mmol/L (ref 135–145)

## 2022-07-12 LAB — MAGNESIUM: Magnesium: 1.4 mg/dL — ABNORMAL LOW (ref 1.7–2.4)

## 2022-07-12 MED ORDER — LACTATED RINGERS IV SOLN: INTRAVENOUS | Status: DC

## 2022-07-12 MED ORDER — POTASSIUM CHLORIDE CRYS ER 20 MEQ PO TBCR
40.0000 meq | EXTENDED_RELEASE_TABLET | ORAL | Status: AC
  Administered 2022-07-12 (×3): 40 meq via ORAL
  Filled 2022-07-12 (×3): qty 2

## 2022-07-12 MED ORDER — MAGNESIUM SULFATE 4 GM/100ML IV SOLN
4.0000 g | Freq: Once | INTRAVENOUS | Status: AC
  Administered 2022-07-12: 4 g via INTRAVENOUS
  Filled 2022-07-12: qty 100

## 2022-07-12 NOTE — Assessment & Plan Note (Signed)
-   Repleted as needed 

## 2022-07-12 NOTE — Progress Notes (Signed)
  Transition of Care [facility]) Screening Note   Patient Details  Name: Steven [name] Date of Birth: [birth year]   [facility] of [facility]) CM/SW Contact:    Henrietta Dine, RN Phone Number: 07/12/2022, 10:03 AM    Transition of Care Department [facility]) has reviewed patient and no TOC needs have been identified at this time. We will continue to monitor patient advancement through interdisciplinary progression rounds. If new patient transition needs arise, please place a TOC consult.

## 2022-07-12 NOTE — Progress Notes (Signed)
Malachy Mood called from [place] lab to report Steven [name] is positive for the Norovirus. Dr. Sabino Gasser notified and patient put on Contact precaution. He was given a handout explaining what the Norovirus is and how to prevent it and treat it.

## 2022-07-12 NOTE — Progress Notes (Signed)
Progress Note    Steven [name]   [id number]  DOB: Aug 26, 1934  DOA: 07/11/2022     0 PCP: [name], MD Initial CC: Diarrhea  Hospital Course: Steven [name] is a 88 y.o. male with medical history significant for ankylosing spondylitis on methotrexate, TIA, CAS s/p left TCAR, iron deficiency anemia, HLD who is admitted with gastroenteritis.  Interval History:  No events overnight.  Still lethargic this morning and appetite not back to normal and not taking in adequate nutrition yet.  Amenable with attempting diet advancement.  Still having diarrhea overnight and remains depleted with electrolytes.  Assessment and Plan: * SIRS (systemic inflammatory response syndrome) (HCC) - sepsis ruled out; SIRS criteria and lactic acid considered due to dehydration from GI losses from diarrhea but not overtly septic  - responded well to IVF - follow up Cdiff and GI panel - continue supportive care - follow up cultures   Gastroenteritis - N/V/D but mostly diarrhea on admission - Fever also noted on admission but no leukocytosis nor abdominal pain - Follow-up C. difficile and GI pathogen panel - Continue fluids and supportive care - Advance diet  History of CVA (cerebrovascular accident) He has been off his aspirin and Plavix per his dentist for recent and upcoming dental procedures.  Continue rosuvastatin.  Hypomagnesemia - Replete as needed  Hypokalemia - Replete as needed  Ankylosing spondylitis of lumbosacral region [facility]) He is on methotrexate weekly.  Hold for now.   Old records reviewed in assessment of this patient  Antimicrobials:   DVT prophylaxis:  enoxaparin (LOVENOX) injection 40 mg Start: 07/11/22 2200   Code Status:   Code Status: Full Code  Mobility Assessment (last 72 hours)     Mobility Assessment     Row Name 07/11/22 2300           Does patient have an order for bedrest or is patient medically unstable No - Continue assessment       What is  the highest level of mobility based on the progressive mobility assessment? Level 5 (Walks with assist in room/hall) - Balance while stepping forward/back and can walk in room with assist - Complete                Barriers to discharge:  Disposition Plan: Home 1 to 2 days Status is: Inpt  Objective: Blood pressure (!) 110/52, pulse 73, temperature (!) 97.4 F (36.3 C), temperature source Oral, resp. rate 16, height 5\' 7"  (1.702 m), weight 61.2 kg, SpO2 91 %.  Examination:  Physical Exam Constitutional:      General: He is not in acute distress.    Comments: Fatigued appearing  HENT:     Head: Normocephalic and atraumatic.     Mouth/Throat:     Mouth: Mucous membranes are moist.  Eyes:     Extraocular Movements: Extraocular movements intact.  Cardiovascular:     Rate and Rhythm: Normal rate and regular rhythm.     Heart sounds: Normal heart sounds.  Pulmonary:     Effort: Pulmonary effort is normal. No respiratory distress.     Breath sounds: Normal breath sounds. No wheezing.  Abdominal:     General: Bowel sounds are normal. There is no distension.     Palpations: Abdomen is soft.     Tenderness: There is no abdominal tenderness.  Musculoskeletal:        General: Normal range of motion.     Cervical back: Normal range of motion and neck supple.  Skin:    General: Skin is warm and dry.  Neurological:     General: No focal deficit present.     Mental Status: He is alert.  Psychiatric:        Mood and Affect: Mood normal.        Behavior: Behavior normal.      Consultants:    Procedures:    Data Reviewed: Results for orders placed or performed during the hospital encounter of 07/11/22 (from the past 24 hour(s))  Comprehensive metabolic panel     Status: Abnormal   Collection Time: 07/11/22  3:40 PM  Result Value Ref Range   Sodium 133 (L) 135 - 145 mmol/L   Potassium 3.6 3.5 - 5.1 mmol/L   Chloride 101 98 - 111 mmol/L   CO2 23 22 - 32 mmol/L   Glucose,  Bld 137 (H) 70 - 99 mg/dL   BUN 33 (H) 8 - 23 mg/dL   Creatinine, Ser 1.02 0.61 - 1.24 mg/dL   Calcium 7.6 (L) 8.9 - 10.3 mg/dL   Total Protein 7.0 6.5 - 8.1 g/dL   Albumin 3.6 3.5 - 5.0 g/dL   AST 26 15 - 41 U/L   ALT 23 0 - 44 U/L   Alkaline Phosphatase 39 38 - 126 U/L   Total Bilirubin 1.2 0.3 - 1.2 mg/dL   GFR, Estimated >60 >60 mL/min   Anion gap 9 5 - 15  Lactic acid, plasma     Status: Abnormal   Collection Time: 07/11/22  3:40 PM  Result Value Ref Range   Lactic Acid, Venous 3.6 (HH) 0.5 - 1.9 mmol/L  CBC with Differential     Status: Abnormal   Collection Time: 07/11/22  3:40 PM  Result Value Ref Range   WBC 8.1 4.0 - 10.5 K/uL   RBC 3.50 (L) 4.22 - 5.81 MIL/uL   Hemoglobin 11.6 (L) 13.0 - 17.0 g/dL   HCT 35.0 (L) 39.0 - 52.0 %   MCV 100.0 80.0 - 100.0 fL   MCH 33.1 26.0 - 34.0 pg   MCHC 33.1 30.0 - 36.0 g/dL   RDW 13.7 11.5 - 15.5 %   Platelets 203 150 - 400 K/uL   nRBC 0.0 0.0 - 0.2 %   Neutrophils Relative % 87 %   Neutro Abs 7.1 1.7 - 7.7 K/uL   Lymphocytes Relative 4 %   Lymphs Abs 0.3 (L) 0.7 - 4.0 K/uL   Monocytes Relative 9 %   Monocytes Absolute 0.7 0.1 - 1.0 K/uL   Eosinophils Relative 0 %   Eosinophils Absolute 0.0 0.0 - 0.5 K/uL   Basophils Relative 0 %   Basophils Absolute 0.0 0.0 - 0.1 K/uL   WBC Morphology DOHLE BODIES    Immature Granulocytes 0 %   Abs Immature Granulocytes 0.02 0.00 - 0.07 K/uL   Polychromasia PRESENT    Ovalocytes PRESENT   Culture, blood (Routine x 2)     Status: None (Preliminary result)   Collection Time: 07/11/22  3:40 PM   Specimen: BLOOD  Result Value Ref Range   Specimen Description      BLOOD SITE NOT SPECIFIED Performed at [facility], [address], [place], [place] [address]    Special Requests      BOTTLES DRAWN AEROBIC AND ANAEROBIC Blood Culture adequate volume Performed at [place] [address] [place], [place] [address]    Culture      NO GROWTH < 24  HOURS  Performed at [facility], [place] [address] [place], [place] [address]    Report Status PENDING   Protime-INR     Status: None   Collection Time: 07/11/22  3:40 PM  Result Value Ref Range   Prothrombin Time 15.1 11.4 - 15.2 seconds   INR 1.2 0.8 - 1.2  Brain natriuretic peptide     Status: Abnormal   Collection Time: 07/11/22  3:40 PM  Result Value Ref Range   B Natriuretic Peptide 142.7 (H) 0.0 - 100.0 pg/mL  Culture, blood (Routine x 2)     Status: None (Preliminary result)   Collection Time: 07/11/22  4:00 PM   Specimen: BLOOD  Result Value Ref Range   Specimen Description      BLOOD SITE NOT SPECIFIED Performed at [place] [address], [place], [place] [address]    Special Requests      BOTTLES DRAWN AEROBIC AND ANAEROBIC Blood Culture results may not be optimal due to an excessive volume of blood received in culture bottles Performed at [place] [address] [place], [place] [address]    Culture      NO GROWTH < 24 HOURS Performed at [facility] [address] [place], [place] [address]    Report Status PENDING   Urinalysis, w/ Reflex to Culture (Infection Suspected) -Urine, Clean Catch     Status: None   Collection Time: 07/11/22  5:00 PM  Result Value Ref Range   Specimen Source URINE, CLEAN CATCH    Color, Urine YELLOW YELLOW   APPearance CLEAR CLEAR   Specific Gravity, Urine 1.016 1.005 - 1.030   pH 5.0 5.0 - 8.0   Glucose, UA NEGATIVE NEGATIVE mg/dL   Hgb urine dipstick NEGATIVE NEGATIVE   Bilirubin Urine NEGATIVE NEGATIVE   Ketones, ur NEGATIVE NEGATIVE mg/dL   Protein, ur NEGATIVE NEGATIVE mg/dL   Nitrite NEGATIVE NEGATIVE   Leukocytes,Ua NEGATIVE NEGATIVE   RBC / HPF 0-5 0 - 5 RBC/hpf   WBC, UA 0-5 0 - 5 WBC/hpf   Bacteria, UA NONE SEEN NONE SEEN   Squamous Epithelial / HPF 0-5 0 - 5 /HPF   Mucus PRESENT    Hyaline Casts, UA PRESENT   Resp panel by RT-PCR (RSV, Flu A&B, Covid)  Anterior Nasal Swab     Status: None   Collection Time: 07/11/22  5:13 PM   Specimen: Anterior Nasal Swab  Result Value Ref Range   SARS Coronavirus 2 by RT PCR NEGATIVE NEGATIVE   Influenza A by PCR NEGATIVE NEGATIVE   Influenza B by PCR NEGATIVE NEGATIVE   Resp Syncytial Virus by PCR NEGATIVE NEGATIVE  Lactic acid, plasma     Status: Abnormal   Collection Time: 07/11/22  5:45 PM  Result Value Ref Range   Lactic Acid, Venous 3.1 (HH) 0.5 - 1.9 mmol/L  Lactic acid, plasma     Status: None   Collection Time: 07/11/22  9:45 PM  Result Value Ref Range   Lactic Acid, Venous 1.7 0.5 - 1.9 mmol/L  CBC     Status: Abnormal   Collection Time: 07/12/22  5:33 AM  Result Value Ref Range   WBC 6.6 4.0 - 10.5 K/uL   RBC 2.96 (L) 4.22 - 5.81 MIL/uL   Hemoglobin 9.8 (L) 13.0 - 17.0 g/dL   HCT 30.2 (L) 39.0 - 52.0 %   MCV 102.0 (H) 80.0 - 100.0 fL   MCH 33.1 26.0 - 34.0 pg  MCHC 32.5 30.0 - 36.0 g/dL   RDW 13.7 11.5 - 15.5 %   Platelets 157 150 - 400 K/uL   nRBC 0.0 0.0 - 0.2 %  Basic metabolic panel     Status: Abnormal   Collection Time: 07/12/22  5:33 AM  Result Value Ref Range   Sodium 135 135 - 145 mmol/L   Potassium 2.8 (L) 3.5 - 5.1 mmol/L   Chloride 107 98 - 111 mmol/L   CO2 20 (L) 22 - 32 mmol/L   Glucose, Bld 88 70 - 99 mg/dL   BUN 23 8 - 23 mg/dL   Creatinine, Ser 0.79 0.61 - 1.24 mg/dL   Calcium 7.3 (L) 8.9 - 10.3 mg/dL   GFR, Estimated >60 >60 mL/min   Anion gap 8 5 - 15  Magnesium     Status: Abnormal   Collection Time: 07/12/22  5:33 AM  Result Value Ref Range   Magnesium 1.4 (L) 1.7 - 2.4 mg/dL    I have reviewed pertinent nursing notes, vitals, labs, and images as necessary. I have ordered labwork to follow up on as indicated.  I have reviewed the last notes from staff over past 24 hours. I have discussed patient's care plan and test results with nursing staff, CM/SW, and other staff as appropriate.  Time spent: Greater than 50% of the 55 minute visit was spent  in counseling/coordination of care for the patient as laid out in the A&P.   LOS: 0 days   Dwyane Dee, MD [facility] 07/12/2022, 11:28 AM

## 2022-07-13 ENCOUNTER — Encounter (HOSPITAL_COMMUNITY): Payer: Self-pay | Admitting: Internal Medicine

## 2022-07-13 DIAGNOSIS — R6510 Systemic inflammatory response syndrome (SIRS) of non-infectious origin without acute organ dysfunction: Secondary | ICD-10-CM | POA: Diagnosis not present

## 2022-07-13 DIAGNOSIS — A0811 Acute gastroenteropathy due to Norwalk agent: Secondary | ICD-10-CM | POA: Diagnosis not present

## 2022-07-13 LAB — CBC WITH DIFFERENTIAL/PLATELET
Abs Immature Granulocytes: 0.08 10*3/uL — ABNORMAL HIGH (ref 0.00–0.07)
Basophils Absolute: 0 10*3/uL (ref 0.0–0.1)
Basophils Relative: 0 %
Eosinophils Absolute: 0.1 10*3/uL (ref 0.0–0.5)
Eosinophils Relative: 2 %
HCT: 30.3 % — ABNORMAL LOW (ref 39.0–52.0)
Hemoglobin: 10 g/dL — ABNORMAL LOW (ref 13.0–17.0)
Immature Granulocytes: 1 %
Lymphocytes Relative: 6 %
Lymphs Abs: 0.5 10*3/uL — ABNORMAL LOW (ref 0.7–4.0)
MCH: 33.3 pg (ref 26.0–34.0)
MCHC: 33 g/dL (ref 30.0–36.0)
MCV: 101 fL — ABNORMAL HIGH (ref 80.0–100.0)
Monocytes Absolute: 0.6 10*3/uL (ref 0.1–1.0)
Monocytes Relative: 8 %
Neutro Abs: 6.1 10*3/uL (ref 1.7–7.7)
Neutrophils Relative %: 83 %
Platelets: 155 10*3/uL (ref 150–400)
RBC: 3 MIL/uL — ABNORMAL LOW (ref 4.22–5.81)
RDW: 13.4 % (ref 11.5–15.5)
WBC: 7.4 10*3/uL (ref 4.0–10.5)
nRBC: 0 % (ref 0.0–0.2)

## 2022-07-13 LAB — BASIC METABOLIC PANEL
Anion gap: 6 (ref 5–15)
BUN: 11 mg/dL (ref 8–23)
CO2: 23 mmol/L (ref 22–32)
Calcium: 7.9 mg/dL — ABNORMAL LOW (ref 8.9–10.3)
Chloride: 105 mmol/L (ref 98–111)
Creatinine, Ser: 0.77 mg/dL (ref 0.61–1.24)
GFR, Estimated: >60 mL/min (ref >60)
Glucose, Bld: 91 mg/dL (ref 70–99)
Potassium: 4 mmol/L (ref 3.5–5.1)
Sodium: 134 mmol/L — ABNORMAL LOW (ref 135–145)

## 2022-07-13 LAB — MAGNESIUM: Magnesium: 2.5 mg/dL — ABNORMAL HIGH (ref 1.7–2.4)

## 2022-07-13 NOTE — Progress Notes (Signed)
Progress Note    Steven [name]   [id number]  DOB: 1934/09/21  DOA: 07/11/2022     1 PCP: [name], MD Initial CC: Diarrhea  Hospital Course: Steven [name] is a 88 y.o. male with medical history significant for ankylosing spondylitis on methotrexate, TIA, CAS s/p left TCAR, iron deficiency anemia, HLD who is admitted with gastroenteritis.  Stool studies were obtained and came back positive with norovirus.  Interval History:  No events overnight.  Still having some diarrhea but states it may be slightly improved today.  Appetite remains okay and he continues to try to eat.  Assessment and Plan: * SIRS (systemic inflammatory response syndrome)-resolved as of 07/13/2022 - sepsis ruled out; SIRS criteria and lactic acid considered due to dehydration from GI losses from diarrhea but not overtly septic  - responded well to IVF -Negative C. difficile testing - GI pathogen panel positive for norovirus -Continue supportive care  Gastroenteritis due to norovirus - N/V/D but mostly diarrhea on admission - Fever also noted on admission but no leukocytosis nor abdominal pain -GI pathogen panel positive for norovirus - No indication for treatment - Continue fluids and supportive care  History of CVA (cerebrovascular accident) He has been off his aspirin and Plavix per his dentist for recent and upcoming dental procedures.  Continue rosuvastatin.  Hypomagnesemia - Replete as needed  Hypokalemia - Replete as needed  Ankylosing spondylitis of lumbosacral region He is on methotrexate weekly.  Hold for now.   Old records reviewed in assessment of this patient  Antimicrobials:   DVT prophylaxis:  enoxaparin (LOVENOX) injection 40 mg Start: 07/11/22 2200   Code Status:   Code Status: Full Code  Mobility Assessment (last 72 hours)     Mobility Assessment     Row Name 07/12/22 1412 07/12/22 1010 07/11/22 2300       Does patient have an order for bedrest or is patient  medically unstable No - Continue assessment No - Continue assessment No - Continue assessment     What is the highest level of mobility based on the progressive mobility assessment? Level 4 (Walks with assist in room) - Balance while marching in place and cannot step forward and back - Complete Level 4 (Walks with assist in room) - Balance while marching in place and cannot step forward and back - Complete Level 5 (Walks with assist in room/hall) - Balance while stepping forward/back and can walk in room with assist - Complete              Barriers to discharge:  Disposition Plan: Home 1 to 2 days Status is: Inpt  Objective: Blood pressure (!) 103/55, pulse 70, temperature 99.4 F (37.4 C), temperature source Oral, resp. rate 16, height 5\' 7"  (1.702 m), weight 61.2 kg, SpO2 93 %.  Examination:  Physical Exam Constitutional:      General: He is not in acute distress.    Comments: Fatigued appearing  HENT:     Head: Normocephalic and atraumatic.     Mouth/Throat:     Mouth: Mucous membranes are moist.  Eyes:     Extraocular Movements: Extraocular movements intact.  Cardiovascular:     Rate and Rhythm: Normal rate and regular rhythm.     Heart sounds: Normal heart sounds.  Pulmonary:     Effort: Pulmonary effort is normal. No respiratory distress.     Breath sounds: Normal breath sounds. No wheezing.  Abdominal:     General: Bowel sounds are normal. There  is no distension.     Palpations: Abdomen is soft.     Tenderness: There is no abdominal tenderness.  Musculoskeletal:        General: Normal range of motion.     Cervical back: Normal range of motion and neck supple.  Skin:    General: Skin is warm and dry.  Neurological:     General: No focal deficit present.     Mental Status: He is alert.  Psychiatric:        Mood and Affect: Mood normal.        Behavior: Behavior normal.      Consultants:    Procedures:    Data Reviewed: Results for orders placed or  performed during the hospital encounter of 07/11/22 (from the past 24 hour(s))  C Difficile Quick Screen w PCR reflex     Status: None   Collection Time: 07/12/22  3:15 PM   Specimen: STOOL  Result Value Ref Range   C Diff antigen NEGATIVE NEGATIVE   C Diff toxin NEGATIVE NEGATIVE   C Diff interpretation No C. difficile detected.   Basic metabolic panel     Status: Abnormal   Collection Time: 07/13/22  4:54 AM  Result Value Ref Range   Sodium 134 (L) 135 - 145 mmol/L   Potassium 4.0 3.5 - 5.1 mmol/L   Chloride 105 98 - 111 mmol/L   CO2 23 22 - 32 mmol/L   Glucose, Bld 91 70 - 99 mg/dL   BUN 11 8 - 23 mg/dL   Creatinine, Ser 0.77 0.61 - 1.24 mg/dL   Calcium 7.9 (L) 8.9 - 10.3 mg/dL   GFR, Estimated >60 >60 mL/min   Anion gap 6 5 - 15  CBC with Differential/Platelet     Status: Abnormal   Collection Time: 07/13/22  4:54 AM  Result Value Ref Range   WBC 7.4 4.0 - 10.5 K/uL   RBC 3.00 (L) 4.22 - 5.81 MIL/uL   Hemoglobin 10.0 (L) 13.0 - 17.0 g/dL   HCT 30.3 (L) 39.0 - 52.0 %   MCV 101.0 (H) 80.0 - 100.0 fL   MCH 33.3 26.0 - 34.0 pg   MCHC 33.0 30.0 - 36.0 g/dL   RDW 13.4 11.5 - 15.5 %   Platelets 155 150 - 400 K/uL   nRBC 0.0 0.0 - 0.2 %   Neutrophils Relative % 83 %   Neutro Abs 6.1 1.7 - 7.7 K/uL   Lymphocytes Relative 6 %   Lymphs Abs 0.5 (L) 0.7 - 4.0 K/uL   Monocytes Relative 8 %   Monocytes Absolute 0.6 0.1 - 1.0 K/uL   Eosinophils Relative 2 %   Eosinophils Absolute 0.1 0.0 - 0.5 K/uL   Basophils Relative 0 %   Basophils Absolute 0.0 0.0 - 0.1 K/uL   Immature Granulocytes 1 %   Abs Immature Granulocytes 0.08 (H) 0.00 - 0.07 K/uL   Polychromasia PRESENT   Magnesium     Status: Abnormal   Collection Time: 07/13/22  4:54 AM  Result Value Ref Range   Magnesium 2.5 (H) 1.7 - 2.4 mg/dL    I have reviewed pertinent nursing notes, vitals, labs, and images as necessary. I have ordered labwork to follow up on as indicated.  I have reviewed the last notes from staff over  past 24 hours. I have discussed patient's care plan and test results with nursing staff, CM/SW, and other staff as appropriate.  Time spent: Greater than 50% of the 55 minute  visit was spent in counseling/coordination of care for the patient as laid out in the A&P.   LOS: 1 day   Dwyane Dee, MD [facility] 07/13/2022, 1:22 PM

## 2022-07-14 DIAGNOSIS — R6510 Systemic inflammatory response syndrome (SIRS) of non-infectious origin without acute organ dysfunction: Secondary | ICD-10-CM | POA: Diagnosis not present

## 2022-07-14 DIAGNOSIS — A0811 Acute gastroenteropathy due to Norwalk agent: Secondary | ICD-10-CM | POA: Diagnosis not present

## 2022-07-14 LAB — CBC WITH DIFFERENTIAL/PLATELET
Abs Immature Granulocytes: 0.05 10*3/uL (ref 0.00–0.07)
Basophils Absolute: 0 10*3/uL (ref 0.0–0.1)
Basophils Relative: 0 %
Eosinophils Absolute: 0.2 10*3/uL (ref 0.0–0.5)
Eosinophils Relative: 2 %
HCT: 31.5 % — ABNORMAL LOW (ref 39.0–52.0)
Hemoglobin: 10.3 g/dL — ABNORMAL LOW (ref 13.0–17.0)
Immature Granulocytes: 1 %
Lymphocytes Relative: 6 %
Lymphs Abs: 0.5 10*3/uL — ABNORMAL LOW (ref 0.7–4.0)
MCH: 33 pg (ref 26.0–34.0)
MCHC: 32.7 g/dL (ref 30.0–36.0)
MCV: 101 fL — ABNORMAL HIGH (ref 80.0–100.0)
Monocytes Absolute: 0.7 10*3/uL (ref 0.1–1.0)
Monocytes Relative: 9 %
Neutro Abs: 6.6 10*3/uL (ref 1.7–7.7)
Neutrophils Relative %: 82 %
Platelets: 194 10*3/uL (ref 150–400)
RBC: 3.12 MIL/uL — ABNORMAL LOW (ref 4.22–5.81)
RDW: 13.2 % (ref 11.5–15.5)
WBC: 8.1 10*3/uL (ref 4.0–10.5)
nRBC: 0 % (ref 0.0–0.2)

## 2022-07-14 LAB — BASIC METABOLIC PANEL
Anion gap: 7 (ref 5–15)
BUN: 9 mg/dL (ref 8–23)
CO2: 24 mmol/L (ref 22–32)
Calcium: 8.1 mg/dL — ABNORMAL LOW (ref 8.9–10.3)
Chloride: 104 mmol/L (ref 98–111)
Creatinine, Ser: 0.59 mg/dL — ABNORMAL LOW (ref 0.61–1.24)
GFR, Estimated: >60 mL/min (ref >60)
Glucose, Bld: 100 mg/dL — ABNORMAL HIGH (ref 70–99)
Potassium: 3.4 mmol/L — ABNORMAL LOW (ref 3.5–5.1)
Sodium: 135 mmol/L (ref 135–145)

## 2022-07-14 LAB — MAGNESIUM: Magnesium: 1.8 mg/dL (ref 1.7–2.4)

## 2022-07-14 MED ORDER — POTASSIUM CHLORIDE CRYS ER 20 MEQ PO TBCR
40.0000 meq | EXTENDED_RELEASE_TABLET | Freq: Once | ORAL | Status: AC
  Administered 2022-07-14: 40 meq via ORAL
  Filled 2022-07-14: qty 2

## 2022-07-14 NOTE — Discharge Summary (Signed)
Physician Discharge Summary   Steven [name] [id number] DOB: 1935/01/26 DOA: 07/11/2022  PCP: [name], MD  Admit date: 07/11/2022 Discharge date: 07/14/2022   Admitted From: Home Disposition:  Home Discharging physician: Dwyane Dee, MD Barriers to discharge:   Recommendations for Outpatient Follow-up:  Resume chronic outpatient management   Discharge Condition: stable CODE STATUS: Full Diet recommendation:  Diet Orders (From admission, onward)     Start     Ordered   07/14/22 0000  Diet general        07/14/22 1258   07/12/22 0931  Diet regular Fluid consistency: Thin  Diet effective now       Question:  Fluid consistency:  Answer:  Thin   07/12/22 0930            Hospital Course: Steven [name] is a 88 y.o. male with medical history significant for ankylosing spondylitis on methotrexate, TIA, CAS s/p left TCAR, iron deficiency anemia, HLD who is admitted with gastroenteritis.  Stool studies were obtained and came back positive with norovirus. He was treated supportively and had slow improvement.  He was able to tolerate diet well and had improvement in his diarrhea prior to discharge.  He was also evaluated by physical therapy and ambulated well with rolling walker which he has at home. He was considered stable for discharging home.  Assessment and Plan: * SIRS (systemic inflammatory response syndrome)-resolved as of 07/13/2022 - sepsis ruled out; SIRS criteria and lactic acid considered due to dehydration from GI losses from diarrhea but not overtly septic  - responded well to IVF -Negative C. difficile testing - GI pathogen panel positive for norovirus -Continue supportive care  Gastroenteritis due to norovirus - N/V/D but mostly diarrhea on admission - Fever also noted on admission but no leukocytosis nor abdominal pain -GI pathogen panel positive for norovirus - No indication for treatment -Diarrhea showed some improvement prior to discharge  History  of CVA (cerebrovascular accident) He has been off his aspirin and Plavix per his dentist for recent and upcoming dental procedures.  Continue rosuvastatin.  Hypomagnesemia - Repleted as needed  Hypokalemia - Repleted as needed  Ankylosing spondylitis of lumbosacral region He is on methotrexate weekly.    The patient's chronic medical conditions were treated accordingly per the patient's home medication regimen except as noted.  On day of discharge, patient was felt deemed stable for discharge. Patient/family member advised to call PCP or come back to ER if needed.   Principal Diagnosis: SIRS (systemic inflammatory response syndrome)  Discharge Diagnoses: Active Hospital Problems   Diagnosis Date Noted   Gastroenteritis due to norovirus 07/11/2022    Priority: 2.   History of CVA (cerebrovascular accident) 07/11/2022   Hypokalemia    Hypomagnesemia    Ankylosing spondylitis of lumbosacral region     Resolved Hospital Problems   Diagnosis Date Noted Date Resolved   SIRS (systemic inflammatory response syndrome) 06/03/2021 07/13/2022    Priority: 1.     Discharge Instructions     Diet general   Complete by: As directed    Increase activity slowly   Complete by: As directed       Allergies as of 07/14/2022       Reactions   Indomethacin Hives, Other (See Comments)   Lactose Intolerance (gi)    GI UPSET        Medication List     TAKE these medications    aspirin EC 325 MG tablet Take 1 tablet (325 mg  total) by mouth daily.   Benefiber Powd Take 1 Scoop by mouth daily.   Biotin 5000 MCG Tabs Take 1 tablet by mouth daily.   Calcium Citrate 250 MG Tabs Take 1 tablet by mouth daily.   cholecalciferol 25 MCG (1000 UT) tablet Generic drug: Cholecalciferol Take 1,000 Units by mouth daily.   citalopram 20 MG tablet Commonly known as: CELEXA Take 20 mg by mouth daily.   clopidogrel 75 MG tablet Commonly known as: PLAVIX Take 1 tablet (75 mg total) by  mouth daily.   ferrous sulfate 325 (65 FE) MG tablet Take 1 tablet (325 mg total) by mouth daily with breakfast.   folic acid 1 MG tablet Commonly known as: FOLVITE Take 1 mg by mouth in the morning, at noon, and at bedtime.   Gemtesa 75 MG Tabs Generic drug: Vibegron Take 1 tablet by mouth daily.   ipratropium 0.06 % nasal spray Commonly known as: ATROVENT Place 2 sprays into both nostrils 2 (two) times daily as needed (allergies).   methotrexate 2.5 MG tablet Commonly known as: RHEUMATREX Take 20 mg by mouth once a week. TAKE 8 TABLETS (20 MG) BY MOUTH ONCE A WEEK.   pantoprazole 40 MG tablet Commonly known as: PROTONIX Take 1 tablet (40 mg total) by mouth daily.   PRESERVISION AREDS 2 PO Take 1 tablet by mouth in the morning and at bedtime.   rosuvastatin 20 MG tablet Commonly known as: CRESTOR Take 1 tablet (20 mg total) by mouth at bedtime.        Allergies  Allergen Reactions   Indomethacin Hives and Other (See Comments)   Lactose Intolerance (Gi)     GI UPSET    Consultations:   Procedures:   Discharge Exam: BP (!) 119/57 (BP Location: Left Arm)   Pulse 71   Temp 98.2 F (36.8 C) (Oral)   Resp 14   Ht 5\' 7"  (1.702 m)   Wt 61.2 kg   SpO2 97%   BMI 21.13 kg/m  Physical Exam Constitutional:      General: He is not in acute distress.    Comments: Fatigued appearing  HENT:     Head: Normocephalic and atraumatic.     Mouth/Throat:     Mouth: Mucous membranes are moist.  Eyes:     Extraocular Movements: Extraocular movements intact.  Cardiovascular:     Rate and Rhythm: Normal rate and regular rhythm.     Heart sounds: Normal heart sounds.  Pulmonary:     Effort: Pulmonary effort is normal. No respiratory distress.     Breath sounds: Normal breath sounds. No wheezing.  Abdominal:     General: Bowel sounds are normal. There is no distension.     Palpations: Abdomen is soft.     Tenderness: There is no abdominal tenderness.   Musculoskeletal:        General: Normal range of motion.     Cervical back: Normal range of motion and neck supple.  Skin:    General: Skin is warm and dry.  Neurological:     General: No focal deficit present.     Mental Status: He is alert.  Psychiatric:        Mood and Affect: Mood normal.        Behavior: Behavior normal.      The results of significant diagnostics from this hospitalization (including imaging, microbiology, ancillary and laboratory) are listed below for reference.   Microbiology: Recent Results (from the past 240 hour(s))  Culture, blood (Routine x 2)     Status: None (Preliminary result)   Collection Time: 07/11/22  3:40 PM   Specimen: BLOOD  Result Value Ref Range Status   Specimen Description   Final    BLOOD SITE NOT SPECIFIED Performed at [facility] [address] [place], [place] [address]    Special Requests   Final    BOTTLES DRAWN AEROBIC AND ANAEROBIC Blood Culture adequate volume Performed at [facility] [address] [place], [place] [address]    Culture   Final    NO GROWTH 3 DAYS Performed at [facility], [place] [address] [place], [place] [address]    Report Status PENDING  Incomplete  Culture, blood (Routine x 2)     Status: None (Preliminary result)   Collection Time: 07/11/22  4:00 PM   Specimen: BLOOD  Result Value Ref Range Status   Specimen Description   Final    BLOOD SITE NOT SPECIFIED Performed at [facility] [address], [place], [place] [address]    Special Requests   Final    BOTTLES DRAWN AEROBIC AND ANAEROBIC Blood Culture results may not be optimal due to an excessive volume of blood received in culture bottles Performed at [facility] [address] [place], [place] [address]    Culture   Final    NO GROWTH 3 DAYS Performed at [facility], [place] [address] [place],  [address]    Report Status PENDING   Incomplete  Resp panel by RT-PCR (RSV, Flu A&B, Covid) Anterior Nasal Swab     Status: None   Collection Time: 07/11/22  5:13 PM   Specimen: Anterior Nasal Swab  Result Value Ref Range Status   SARS Coronavirus 2 by RT PCR NEGATIVE NEGATIVE Final    Comment: (NOTE) SARS-CoV-2 target nucleic acids are NOT DETECTED.  The SARS-CoV-2 RNA is generally detectable in upper respiratory specimens during the acute phase of infection. The lowest concentration of SARS-CoV-2 viral copies this assay can detect is 138 copies/mL. A negative result does not preclude SARS-Cov-2 infection and should not be used as the sole basis for treatment or other patient management decisions. A negative result may occur with  improper specimen collection/handling, submission of specimen other than nasopharyngeal swab, presence of viral mutation(s) within the areas targeted by this assay, and inadequate number of viral copies(<138 copies/mL). A negative result must be combined with clinical observations, patient history, and epidemiological information. The expected result is Negative.  Fact Sheet for Patients:  EntrepreneurPulse.com.au  Fact Sheet for Healthcare Providers:  IncredibleEmployment.be  This test is no t yet approved or cleared by the [place] FDA and  has been authorized for detection and/or diagnosis of SARS-CoV-2 by FDA under an Emergency Use Authorization (EUA). This EUA will remain  in effect (meaning this test can be used) for the duration of the COVID-19 declaration under Section 564(b)(1) of the Act, 21 U.S.C.section 360bbb-3(b)(1), unless the authorization is terminated  or revoked sooner.       Influenza A by PCR NEGATIVE NEGATIVE Final   Influenza B by PCR NEGATIVE NEGATIVE Final    Comment: (NOTE) The Xpert Xpress SARS-CoV-2/FLU/RSV plus assay is intended as an aid in the diagnosis of influenza from Nasopharyngeal swab specimens and should  not be used as a sole basis for treatment. Nasal washings and aspirates are unacceptable for Xpert Xpress SARS-CoV-2/FLU/RSV testing.  Fact Sheet for Patients:  EntrepreneurPulse.com.au  Fact Sheet for Healthcare Providers: IncredibleEmployment.be  This test is not yet approved or cleared by the [place] FDA and has been authorized for detection and/or diagnosis of SARS-CoV-2 by FDA under an Emergency Use Authorization (EUA). This EUA will remain in effect (meaning this test can be used) for the duration of the COVID-19 declaration under Section 564(b)(1) of the Act, 21 U.S.C. section 360bbb-3(b)(1), unless the authorization is terminated or revoked.     Resp Syncytial Virus by PCR NEGATIVE NEGATIVE Final    Comment: (NOTE) Fact Sheet for Patients: EntrepreneurPulse.com.au  Fact Sheet for Healthcare Providers: IncredibleEmployment.be  This test is not yet approved or cleared by the [place] FDA and has been authorized for detection and/or diagnosis of SARS-CoV-2 by FDA under an Emergency Use Authorization (EUA). This EUA will remain in effect (meaning this test can be used) for the duration of the COVID-19 declaration under Section 564(b)(1) of the Act, 21 U.S.C. section 360bbb-3(b)(1), unless the authorization is terminated or revoked.  Performed at [facility], [place] [address], [place], [place] [address]   Gastrointestinal Panel by PCR , Stool     Status: Abnormal   Collection Time: 07/11/22  6:47 PM   Specimen: Stool  Result Value Ref Range Status   Campylobacter species NOT DETECTED NOT DETECTED Final   Plesimonas shigelloides NOT DETECTED NOT DETECTED Final   Salmonella species NOT DETECTED NOT DETECTED Final   Yersinia enterocolitica NOT DETECTED NOT DETECTED Final   Vibrio species NOT DETECTED NOT DETECTED Final   Vibrio cholerae NOT DETECTED NOT DETECTED Final    Enteroaggregative E coli (EAEC) NOT DETECTED NOT DETECTED Final   Enteropathogenic E coli (EPEC) NOT DETECTED NOT DETECTED Final   Enterotoxigenic E coli (ETEC) NOT DETECTED NOT DETECTED Final   Shiga like toxin producing E coli (STEC) NOT DETECTED NOT DETECTED Final   Shigella/Enteroinvasive E coli (EIEC) NOT DETECTED NOT DETECTED Final   Cryptosporidium NOT DETECTED NOT DETECTED Final   Cyclospora cayetanensis NOT DETECTED NOT DETECTED Final   Entamoeba histolytica NOT DETECTED NOT DETECTED Final   Giardia lamblia NOT DETECTED NOT DETECTED Final   Adenovirus F40/41 NOT DETECTED NOT DETECTED Final   Astrovirus NOT DETECTED NOT DETECTED Final   Norovirus GI/GII DETECTED (A) NOT DETECTED Final    Comment: RESULT CALLED TO, READ BACK BY AND VERIFIED WITH: CINDY MILLAR @ 1820 ON 07/12/2022 BY CAF    Rotavirus A NOT DETECTED NOT DETECTED Final   Sapovirus (I, II, IV, and V) NOT DETECTED NOT DETECTED Final    Comment: Performed at [facility], [address] [place], [place] [address]  C Difficile Quick Screen w PCR reflex     Status: None   Collection Time: 07/12/22  3:15 PM   Specimen: STOOL  Result Value Ref Range Status   C Diff antigen NEGATIVE NEGATIVE Final   C Diff toxin NEGATIVE NEGATIVE Final   C Diff interpretation No C. difficile detected.  Final    Comment: Performed at [facility], [place] [address] [place], [place] [address]     Labs: BNP (last 3 results) Recent Labs    07/11/22 1540  BNP 0000000*   Basic Metabolic Panel: Recent Labs  Lab 07/11/22 1540 07/12/22 0533 07/13/22 0454 07/14/22 0616  NA 133* 135 134* 135  K 3.6 2.8* 4.0 3.4*  CL 101 107 105 104  CO2 23 20* 23 24  GLUCOSE 137* 88 91 100*  BUN 33* 23 11  9  CREATININE 1.02 0.79 0.77 0.59*  CALCIUM 7.6* 7.3* 7.9* 8.1*  MG  --  1.4* 2.5* 1.8   Liver Function Tests: Recent Labs  Lab 07/11/22 1540  AST 26  ALT 23  ALKPHOS 39  BILITOT 1.2  PROT 7.0  ALBUMIN 3.6    No results for input(s): "LIPASE", "AMYLASE" in the last 168 hours. No results for input(s): "AMMONIA" in the last 168 hours. CBC: Recent Labs  Lab 07/11/22 1540 07/12/22 0533 07/13/22 0454 07/14/22 0616  WBC 8.1 6.6 7.4 8.1  NEUTROABS 7.1  --  6.1 6.6  HGB 11.6* 9.8* 10.0* 10.3*  HCT 35.0* 30.2* 30.3* 31.5*  MCV 100.0 102.0* 101.0* 101.0*  PLT 203 157 155 194   Cardiac Enzymes: No results for input(s): "CKTOTAL", "CKMB", "CKMBINDEX", "TROPONINI" in the last 168 hours. BNP: Invalid input(s): "POCBNP" CBG: No results for input(s): "GLUCAP" in the last 168 hours. D-Dimer No results for input(s): "DDIMER" in the last 72 hours. Hgb A1c No results for input(s): "HGBA1C" in the last 72 hours. Lipid Profile No results for input(s): "CHOL", "HDL", "LDLCALC", "TRIG", "CHOLHDL", "LDLDIRECT" in the last 72 hours. Thyroid function studies No results for input(s): "TSH", "T4TOTAL", "T3FREE", "THYROIDAB" in the last 72 hours.  Invalid input(s): "FREET3" Anemia work up No results for input(s): "VITAMINB12", "FOLATE", "FERRITIN", "TIBC", "IRON", "RETICCTPCT" in the last 72 hours. Urinalysis    Component Value Date/Time   COLORURINE YELLOW 07/11/2022 1700   APPEARANCEUR CLEAR 07/11/2022 1700   APPEARANCEUR Hazy 04/18/2012 2324   LABSPEC 1.016 07/11/2022 1700   LABSPEC 1.018 04/18/2012 2324   PHURINE 5.0 07/11/2022 1700   GLUCOSEU NEGATIVE 07/11/2022 1700   GLUCOSEU Negative 04/18/2012 2324   HGBUR NEGATIVE 07/11/2022 1700   BILIRUBINUR NEGATIVE 07/11/2022 1700   BILIRUBINUR Negative 04/18/2012 2324   KETONESUR NEGATIVE 07/11/2022 1700   PROTEINUR NEGATIVE 07/11/2022 1700   UROBILINOGEN 0.2 04/09/2014 2138   NITRITE NEGATIVE 07/11/2022 1700   LEUKOCYTESUR NEGATIVE 07/11/2022 1700   LEUKOCYTESUR Negative 04/18/2012 2324   Sepsis Labs Recent Labs  Lab 07/11/22 1540 07/12/22 0533 07/13/22 0454 07/14/22 0616  WBC 8.1 6.6 7.4 8.1   Microbiology Recent Results (from the  past 240 hour(s))  Culture, blood (Routine x 2)     Status: None (Preliminary result)   Collection Time: 07/11/22  3:40 PM   Specimen: BLOOD  Result Value Ref Range Status   Specimen Description   Final    BLOOD SITE NOT SPECIFIED Performed at [facility], [place] [address] [place], [place] [address]    Special Requests   Final    BOTTLES DRAWN AEROBIC AND ANAEROBIC Blood Culture adequate volume Performed at [facility] [address] [place], [place] [address]    Culture   Final    NO GROWTH 3 DAYS Performed at [facility], [place] [address], [place], [place] [address]    Report Status PENDING  Incomplete  Culture, blood (Routine x 2)     Status: None (Preliminary result)   Collection Time: 07/11/22  4:00 PM   Specimen: BLOOD  Result Value Ref Range Status   Specimen Description   Final    BLOOD SITE NOT SPECIFIED Performed at [facility] [address] [place],  [address]    Special Requests   Final    BOTTLES DRAWN AEROBIC AND ANAEROBIC Blood Culture results may not be optimal due to an excessive volume of blood received in culture bottles Performed at [facility]  [place] [address] [place], [place] [address]    Culture   Final    NO GROWTH 3 DAYS Performed at [facility], [place] [address] [place], [place] [address]    Report Status PENDING  Incomplete  Resp panel by RT-PCR (RSV, Flu A&B, Covid) Anterior Nasal Swab     Status: None   Collection Time: 07/11/22  5:13 PM   Specimen: Anterior Nasal Swab  Result Value Ref Range Status   SARS Coronavirus 2 by RT PCR NEGATIVE NEGATIVE Final    Comment: (NOTE) SARS-CoV-2 target nucleic acids are NOT DETECTED.  The SARS-CoV-2 RNA is generally detectable in upper respiratory specimens during the acute phase of infection. The lowest concentration of SARS-CoV-2 viral copies this assay can detect is 138 copies/mL. A negative result  does not preclude SARS-Cov-2 infection and should not be used as the sole basis for treatment or other patient management decisions. A negative result may occur with  improper specimen collection/handling, submission of specimen other than nasopharyngeal swab, presence of viral mutation(s) within the areas targeted by this assay, and inadequate number of viral copies(<138 copies/mL). A negative result must be combined with clinical observations, patient history, and epidemiological information. The expected result is Negative.  Fact Sheet for Patients:  EntrepreneurPulse.com.au  Fact Sheet for Healthcare Providers:  IncredibleEmployment.be  This test is no t yet approved or cleared by the [place] FDA and  has been authorized for detection and/or diagnosis of SARS-CoV-2 by FDA under an Emergency Use Authorization (EUA). This EUA will remain  in effect (meaning this test can be used) for the duration of the COVID-19 declaration under Section 564(b)(1) of the Act, 21 U.S.C.section 360bbb-3(b)(1), unless the authorization is terminated  or revoked sooner.       Influenza A by PCR NEGATIVE NEGATIVE Final   Influenza B by PCR NEGATIVE NEGATIVE Final    Comment: (NOTE) The Xpert Xpress SARS-CoV-2/FLU/RSV plus assay is intended as an aid in the diagnosis of influenza from Nasopharyngeal swab specimens and should not be used as a sole basis for treatment. Nasal washings and aspirates are unacceptable for Xpert Xpress SARS-CoV-2/FLU/RSV testing.  Fact Sheet for Patients: EntrepreneurPulse.com.au  Fact Sheet for Healthcare Providers: IncredibleEmployment.be  This test is not yet approved or cleared by the [place] FDA and has been authorized for detection and/or diagnosis of SARS-CoV-2 by FDA under an Emergency Use Authorization (EUA). This EUA will remain in effect (meaning this test can be used) for  the duration of the COVID-19 declaration under Section 564(b)(1) of the Act, 21 U.S.C. section 360bbb-3(b)(1), unless the authorization is terminated or revoked.     Resp Syncytial Virus by PCR NEGATIVE NEGATIVE Final    Comment: (NOTE) Fact Sheet for Patients: EntrepreneurPulse.com.au  Fact Sheet for Healthcare Providers: IncredibleEmployment.be  This test is not yet approved or cleared by the [place] FDA and has been authorized for detection and/or diagnosis of SARS-CoV-2 by FDA under an Emergency Use Authorization (EUA). This EUA will remain in effect (meaning this test can be used) for the duration of the COVID-19 declaration under Section 564(b)(1) of the Act, 21 U.S.C. section 360bbb-3(b)(1), unless the authorization is terminated or revoked.  Performed at [facility], [place] [address], [place], [place] [address]   Gastrointestinal Panel by PCR , Stool     Status: Abnormal   Collection Time: 07/11/22  6:47 PM   Specimen: Stool  Result Value Ref Range Status   Campylobacter  species NOT DETECTED NOT DETECTED Final   Plesimonas shigelloides NOT DETECTED NOT DETECTED Final   Salmonella species NOT DETECTED NOT DETECTED Final   Yersinia enterocolitica NOT DETECTED NOT DETECTED Final   Vibrio species NOT DETECTED NOT DETECTED Final   Vibrio cholerae NOT DETECTED NOT DETECTED Final   Enteroaggregative E coli (EAEC) NOT DETECTED NOT DETECTED Final   Enteropathogenic E coli (EPEC) NOT DETECTED NOT DETECTED Final   Enterotoxigenic E coli (ETEC) NOT DETECTED NOT DETECTED Final   Shiga like toxin producing E coli (STEC) NOT DETECTED NOT DETECTED Final   Shigella/Enteroinvasive E coli (EIEC) NOT DETECTED NOT DETECTED Final   Cryptosporidium NOT DETECTED NOT DETECTED Final   Cyclospora cayetanensis NOT DETECTED NOT DETECTED Final   Entamoeba histolytica NOT DETECTED NOT DETECTED Final   Giardia lamblia NOT DETECTED NOT  DETECTED Final   Adenovirus F40/41 NOT DETECTED NOT DETECTED Final   Astrovirus NOT DETECTED NOT DETECTED Final   Norovirus GI/GII DETECTED (A) NOT DETECTED Final    Comment: RESULT CALLED TO, READ BACK BY AND VERIFIED WITH: CINDY MILLAR @ 1820 ON 07/12/2022 BY CAF    Rotavirus A NOT DETECTED NOT DETECTED Final   Sapovirus (I, II, IV, and V) NOT DETECTED NOT DETECTED Final    Comment: Performed at [facility], [address] [place], [place] [address]  C Difficile Quick Screen w PCR reflex     Status: None   Collection Time: 07/12/22  3:15 PM   Specimen: STOOL  Result Value Ref Range Status   C Diff antigen NEGATIVE NEGATIVE Final   C Diff toxin NEGATIVE NEGATIVE Final   C Diff interpretation No C. difficile detected.  Final    Comment: Performed at [facility] [place] [address] [place], [place] [address]    Procedures/Studies: Darletta Moll Abd 1 View  Result Date: 07/11/2022 CLINICAL DATA:  Vomiting and diarrhea EXAM: ABDOMEN - 1 VIEW COMPARISON:  11/04/2019, 02/28/2020 FINDINGS: Supine frontal view of the abdomen and pelvis was obtained, excluding the left flank and pubic symphysis by collimation. Nonobstructive bowel gas pattern. Gas is seen throughout the colon to the level of the rectum. No masses or abnormal calcifications. Stable findings of ankylosing spondylitis. Right hip arthroplasty. IMPRESSION: 1. Unremarkable bowel gas pattern. 2. Stable bony changes of ankylosing spondylitis. Electronically Signed   By: Randa Ngo M.D.   On: 07/11/2022 20:19   DG Chest 2 View  Result Date: 07/11/2022 CLINICAL DATA:  Weakness.  Possible sepsis. EXAM: CHEST - 2 VIEW COMPARISON:  06/03/2021 FINDINGS: Probable ankylosing spondylitis as evidenced by flowing syndesmophytes throughout the thoracic spine. Numerous leads and wires project over the chest on the frontal. Midline trachea. Borderline cardiomegaly. No pleural effusion or pneumothorax. Nonspecific mild interstitial  thickening. No lobar consolidation. Widening of left acromioclavicular joint may be postoperative. IMPRESSION: Borderline cardiomegaly with interstitial thickening which is nonspecific. This could represent mild pulmonary venous congestion. No overt congestive failure. No lobar consolidation to explain sepsis. Electronically Signed   By: Abigail Miyamoto M.D.   On: 07/11/2022 16:50     Time coordinating discharge: Over 30 minutes    Dwyane Dee, MD  [facility] 07/14/2022, 4:16 PM

## 2022-07-14 NOTE — Evaluation (Signed)
Physical Therapy Evaluation Patient Details Name: Steven [name] MRN: [id number] DOB: [birth year] Today's Date: 07/14/2022  History of Present Illness  Steven [name] is a 88 y.o. male with medical history significant for ankylosing spondylitis on methotrexate, TIA, CAS s/p left TCAR, iron deficiency anemia, HLD who is admitted with gastroenteritis.  Stool studies were obtained and came back positive with norovirus.  Clinical Impression  The patient reports feeling weak bu happy to be up and is eager to return home. Patient required only supervision for mobility, ambulated with RW and felt he needed it at this time. Patient has a RW at home. Patient declines need for HHPT at this time.   Patient  can continue ambulation with nursing and Mobility specialist if he does not DC today.     Recommendations for follow up therapy are one component of a multi-disciplinary discharge planning process, led by the attending physician.  Recommendations may be updated based on patient status, additional functional criteria and insurance authorization.  Follow Up Recommendations       Assistance Recommended at Discharge Set up Supervision/Assistance  Patient can return home with the following  A lot of help with bathing/dressing/bathroom;Assistance with cooking/housework;Assist for transportation;Help with stairs or ramp for entrance    Equipment Recommendations None recommended by PT  Recommendations for Other Services       Functional Status Assessment Patient has had a recent decline in their functional status and demonstrates the ability to make significant improvements in function in a reasonable and predictable amount of time.     Precautions / Restrictions Precautions Precaution Comments: at risk for loose BM Restrictions Weight Bearing Restrictions: No      Mobility  Bed Mobility Overal bed mobility: Modified Independent             General bed mobility comments: extra time     Transfers Overall transfer level: Needs assistance Equipment used: Rolling walker (2 wheels) Transfers: Sit to/from Stand Sit to Stand: Supervision                Ambulation/Gait Ambulation/Gait assistance: Supervision Gait Distance (Feet): 60 Feet Assistive device: Rolling walker (2 wheels) Gait Pattern/deviations: Step-through pattern Gait velocity: decr     General Gait Details: able to manage in room with the RW, in/out of  BR and round room.  Stairs            Wheelchair Mobility    Modified Rankin (Stroke Patients Only)       Balance Overall balance assessment: Mild deficits observed, not formally tested                                           Pertinent Vitals/Pain Pain Assessment Pain Assessment: No/denies pain    Home Living Family/patient expects to be discharged to:: Private residence Living Arrangements: Spouse/significant other Available Help at Discharge: Family;Available 24 hours/day Type of Home: House Home Access: Stairs to enter Entrance Stairs-Rails: Can reach both Entrance Stairs-Number of Steps: 5   Home Layout: Two level;Able to live on main level with bedroom/bathroom Home Equipment: Rolling Walker (2 wheels);Cane - single point;Shower seat;Grab bars - tub/shower      Prior Function Prior Level of Function : Independent/Modified Independent;Driving             Mobility Comments: Independnet ADLs Comments: IADL's     Hand Dominance  Extremity/Trunk Assessment   Upper Extremity Assessment Upper Extremity Assessment: Overall WFL for tasks assessed         Cervical / Trunk Assessment Cervical / Trunk Assessment: Other exceptions;Kyphotic Cervical / Trunk Exceptions: ankylosign spond.  Communication      Cognition Arousal/Alertness: Awake/alert Behavior During Therapy: WFL for tasks assessed/performed Overall Cognitive Status: Within Functional Limits for tasks assessed                                           General Comments      Exercises     Assessment/Plan    PT Assessment Patient does not need any further PT services  PT Problem List Decreased strength;Decreased activity tolerance       PT Treatment Interventions DME instruction;Gait training;Patient/family education    PT Goals (Current goals can be found in the Care Plan section)  Acute Rehab PT Goals Patient Stated Goal: go home PT Goal Formulation: All assessment and education complete, DC therapy    Frequency       Co-evaluation               AM-PAC PT "6 Clicks" Mobility  Outcome Measure Help needed turning from your back to your side while in a flat bed without using bedrails?: None Help needed moving from lying on your back to sitting on the side of a flat bed without using bedrails?: None Help needed moving to and from a bed to a chair (including a wheelchair)?: None Help needed standing up from a chair using your arms (e.g., wheelchair or bedside chair)?: None Help needed to walk in hospital room?: A Little Help needed climbing 3-5 steps with a railing? : A Little 6 Click Score: 22    End of Session   Activity Tolerance: Patient tolerated treatment well Patient left: in chair;with chair alarm set;with call bell/phone within reach Nurse Communication: Mobility status PT Visit Diagnosis: Unsteadiness on feet (R26.81);Muscle weakness (generalized) (M62.81)    Time: [id number] PT Time Calculation (min) (ACUTE ONLY): 33 min   Charges:   PT Evaluation $PT Eval Low Complexity: 1 Low PT Treatments $Gait Training: 8-22 mins        [place] Office [phone] Weekend [id number]   Claretha Cooper 07/14/2022, 12:47 PM

## 2022-07-16 DIAGNOSIS — R9389 Abnormal findings on diagnostic imaging of other specified body structures: Secondary | ICD-10-CM | POA: Diagnosis not present

## 2022-07-16 DIAGNOSIS — Z09 Encounter for follow-up examination after completed treatment for conditions other than malignant neoplasm: Secondary | ICD-10-CM | POA: Diagnosis not present

## 2022-07-16 LAB — CULTURE, BLOOD (ROUTINE X 2)
Culture: NO GROWTH
Culture: NO GROWTH
Special Requests: ADEQUATE

## 2022-07-23 ENCOUNTER — Telehealth: Payer: Self-pay | Admitting: Hematology and Oncology

## 2022-07-27 ENCOUNTER — Telehealth: Payer: Self-pay | Admitting: Hematology and Oncology

## 2022-07-27 NOTE — Telephone Encounter (Signed)
Patient left voicemail with questions regarding his appointments, left voicemail.

## 2022-07-29 NOTE — Progress Notes (Unsigned)
Synopsis: Referred for spontaneous pneumothorax by [name], MD  Subjective:   PATIENT ID: Steven [name] GENDER: male DOB: 1935-03-24, MRN: [id number]  No chief complaint on file.  88yM with history of ankylosing spondylitis - not on anything presently and no recent lung function tests, former smoker referred for spontaneous pneumothorax.  He woke up one morning and had pain behind left clavicle which persisted, starting in early July or August but he's unsure of the date. He hasn't had any worsened dyspnea. No recent preceding trauma. His CP has resolved not long after he went to see Dr. Renne Crigler.   He has never previously had a pneumothorax.  I called and spoke with Dr. Renne Crigler this afternoon. He says that while the official radiology read was that there was no pneumothorax on an x-ray 11/15/20, he thought there may be subtle left apical PTX and that's why he sent him to [place]. We will request the images.    Otherwise pertinent review of systems is negative.  Lives in [place]. Never has lived outside of [place]. Worked in Phot[employer]. Smoked for 10 years, 1ppd, quit in 1967.  He has no family history of lung disease.     Past Medical History:  Diagnosis Date   Allergy    enviromental   Anemia 2017   Anxiety    Arthritis    ankylosing spodilitis   Blood transfusion without reported diagnosis    during surgery   Cataract    Depression    Dyspnea    with exertion - had Echo done 09/29/16   History of kidney stones    Hyperlipidemia    Intestinal obstruction    Pneumonia    as a child     Family History  Problem Relation Age of Onset   Heart attack Mother    Diabetes Mother    Breast cancer Mother    Heart attack Maternal Grandfather    Pancreatic cancer Brother    Colon cancer Neg Hx    Esophageal cancer Neg Hx    Stomach cancer Neg Hx      Past Surgical History:  Procedure Laterality Date   ABDOMINAL SURGERY     had abcess   APPENDECTOMY     CHOLECYSTECTOMY      with lysis of adhesions   COLONOSCOPY     COLOSTOMY     COLOSTOMY REVERSAL     EYE SURGERY Left    scar tissue removed from cornea   EYE SURGERY Right    cataract surgery with lens implant   JOINT REPLACEMENT Right    hip  x 2 1999 and 2007   SHOULDER ARTHROSCOPY WITH ROTATOR CUFF REPAIR AND SUBACROMIAL DECOMPRESSION Left 03/02/2013   Procedure: LEFT SHOULDER ARTHROSCOPY WITH SUBACROMIAL DECOMPRESSION DISTAL CALVICLE RESECTION AND ROTATOR CUFF REPAIR ;  Surgeon: Senaida Lange, MD;  Location: [facility] OR;  Service: Orthopedics;  Laterality: Left;   TOTAL HIP REVISION Right 11/06/2016   Procedure: TOTAL HIP REVISION OF THE ACETABULAR COMPONENT;  Surgeon: Gean Birchwood, MD;  Location: [facility] OR;  Service: Orthopedics;  Laterality: Right;   TRANSCAROTID ARTERY REVASCULARIZATION  Left 03/31/2021   Procedure: TRANSCAROTID ARTERY REVASCULARIZATION;  Surgeon: Victorino Sparrow, MD;  Location[facility];  Service: Vascular;  Laterality: Left;   ULTRASOUND GUIDANCE FOR VASCULAR ACCESS Right 03/31/2021   Procedure: ULTRASOUND GUIDANCE FOR VASCULAR ACCESS;  Surgeon: Victorino Sparrow, MD;  Location: [facility] OR;  Service: Vascular;  Laterality: Right;    Social History  Socioeconomic History   Marital status: Married    Spouse name: Not on file   Number of children: 2   Years of education: Not on file   Highest education level: Not on file  Occupational History   Occupation: retired  Tobacco Use   Smoking status: Former    Passive exposure: Never   Smokeless tobacco: Never  Vaping Use   Vaping Use: Never used  Substance and Sexual Activity   Alcohol use: Yes    Comment: 3 beers or glasses of wine a day--reports stopped ETOH 11/01/16    Drug use: No   Sexual activity: Never  Other Topics Concern   Not on file  Social History Narrative   Not on file   Social Determinants of Health   Financial Resource Strain: Low Risk  (03/09/2019)   Overall Financial Resource Strain (CARDIA)    Difficulty of Paying  Living Expenses: Not hard at all  Food Insecurity: No Food Insecurity (07/11/2022)   Hunger Vital Sign    Worried About Running Out of Food in the Last Year: Never true    Ran Out of Food in the Last Year: Never true  Transportation Needs: No Transportation Needs (07/11/2022)   PRAPARE - Administrator, Civil Service (Medical): No    Lack of Transportation (Non-Medical): No  Physical Activity: Insufficiently Active (03/09/2019)   Exercise Vital Sign    Days of Exercise per Week: 5 days    Minutes of Exercise per Session: 10 min  Stress: No Stress Concern Present (03/09/2019)   [employer] of Occupational Health - Occupational Stress Questionnaire    Feeling of Stress : Not at all  Social Connections: Unknown (03/09/2019)   Social Connection and Isolation Panel [NHANES]    Frequency of Communication with Friends and Family: Patient declined    Frequency of Social Gatherings with Friends and Family: Patient declined    Attends Religious Services: Patient declined    Database administrator or Organizations: Patient declined    Attends Banker Meetings: Patient declined    Marital Status: Patient declined  Intimate Partner Violence: Not At Risk (07/11/2022)   Humiliation, Afraid, Rape, and Kick questionnaire    Fear of Current or Ex-Partner: No    Emotionally Abused: No    Physically Abused: No    Sexually Abused: No     Allergies  Allergen Reactions   Indomethacin Hives and Other (See Comments)   Lactose Intolerance (Gi)     GI UPSET     Outpatient Medications Prior to Visit  Medication Sig Dispense Refill   aspirin EC 325 MG EC tablet Take 1 tablet (325 mg total) by mouth daily. 30 tablet 1   Biotin 5000 MCG TABS Take 1 tablet by mouth daily.     Calcium Citrate 250 MG TABS Take 1 tablet by mouth daily.     cholecalciferol (VITAMIN D) 1000 units tablet Take 1,000 Units by mouth daily.     citalopram (CELEXA) 20 MG tablet Take 20 mg by mouth daily.       clopidogrel (PLAVIX) 75 MG tablet Take 1 tablet (75 mg total) by mouth daily. 30 tablet 1   ferrous sulfate 325 (65 FE) MG tablet Take 1 tablet (325 mg total) by mouth daily with breakfast. 30 tablet 0   folic acid (FOLVITE) 1 MG tablet Take 1 mg by mouth in the morning, at noon, and at bedtime.     GEMTESA 75 MG TABS Take  1 tablet by mouth daily.     ipratropium (ATROVENT) 0.06 % nasal spray Place 2 sprays into both nostrils 2 (two) times daily as needed (allergies).     methotrexate (RHEUMATREX) 2.5 MG tablet Take 20 mg by mouth once a week. TAKE 8 TABLETS (20 MG) BY MOUTH ONCE A WEEK.     Multiple Vitamins-Minerals (PRESERVISION AREDS 2 PO) Take 1 tablet by mouth in the morning and at bedtime.     pantoprazole (PROTONIX) 40 MG tablet Take 1 tablet (40 mg total) by mouth daily. 30 tablet 1   rosuvastatin (CRESTOR) 20 MG tablet Take 1 tablet (20 mg total) by mouth at bedtime. 30 tablet 1   Wheat Dextrin (BENEFIBER) POWD Take 1 Scoop by mouth daily.     No facility-administered medications prior to visit.       Objective:   Physical Exam:  General appearance: 88 y.o., male, NAD, conversant  Eyes: anicteric sclerae, moist conjunctivae; no lid-lag; PERRL, tracking appropriately HENT: NCAT; oropharynx, MMM, no mucosal ulcerations; normal hard and soft palate Neck: Trachea midline; no lymphadenopathy, no JVD Lungs: diminished bilaterally, no crackles, no wheeze, with normal respiratory effort. Marked kyphosis CV: RRR, no MRGs  Abdomen: Soft, non-tender; non-distended, BS present  Extremities: No peripheral edema, radial and DP pulses present bilaterally  Skin: Normal temperature, turgor and texture; no rash Psych: Appropriate affect Neuro: Alert and oriented to person and place, no focal deficit    There were no vitals filed for this visit.    on  RA BMI Readings from Last 3 Encounters:  07/11/22 21.13 kg/m  05/29/22 20.86 kg/m  02/20/22 21.27 kg/m   Wt Readings from  Last 3 Encounters:  07/11/22 134 lb 14.7 oz (61.2 kg)  05/29/22 133 lb 3.2 oz (60.4 kg)  02/20/22 135 lb 12.8 oz (61.6 kg)     CBC    Component Value Date/Time   WBC 8.1 07/14/2022 0616   RBC 3.12 (L) 07/14/2022 0616   HGB 10.3 (L) 07/14/2022 0616   HGB 11.6 (L) 05/22/2022 1207   HGB 13.0 04/20/2012 0425   HCT 31.5 (L) 07/14/2022 0616   HCT 38.5 (L) 04/20/2012 0425   PLT 194 07/14/2022 0616   PLT 253 05/22/2022 1207   PLT 207 04/20/2012 0425   MCV 101.0 (H) 07/14/2022 0616   MCV 95 04/20/2012 0425   MCH 33.0 07/14/2022 0616   MCHC 32.7 07/14/2022 0616   RDW 13.2 07/14/2022 0616   RDW 13.2 04/20/2012 0425   LYMPHSABS 0.5 (L) 07/14/2022 0616   LYMPHSABS 0.9 (L) 04/20/2012 0425   MONOABS 0.7 07/14/2022 0616   MONOABS 0.6 04/20/2012 0425   EOSABS 0.2 07/14/2022 0616   EOSABS 0.2 04/20/2012 0425   BASOSABS 0.0 07/14/2022 0616   BASOSABS 0.0 04/20/2012 0425      Chest Imaging: CT A/P 02/2020 reviewed by me and lung bases are unremarkable  CXR today reviewed by me with possible left apical PTX but I'm not 100% convinced I see one at all, awaiting final radiology read.  Pulmonary Functions Testing Results:     No data to display              Assessment & Plan:   # Possible left apical spontaneous pneumothorax: if present on original CXR or CT Chest today then it would likely be secondary spontaneous pneumothorax in setting of ankylosing spondylitis and advanced age. I think he could potentially be candidate for VATS pleurodesis despite his age for definitive management since he's  a relatively functional guy but PFTs would probably help assess risk of surgery given his likely AS-related restriction - and I'd probably hold off on PFTs for about 6 weeks after resolution of his pneumothorax. Obviously if after review of original CXR 11/15/20 at Dr. Carolee Rota office and final reads of today's CXR and upcoming CT Chest, there never was pneumothorax then no need to consider  measures to prevent recurrence at all.  Plan: - CT Chest ordered today, request CXR images from 11/14/20, 11/15/20  - final read of today's CXR pending - will call after reviewing all of this - ED precautions for dyspnea, worsening CP given, counseled to avoid air travel for now until we confirm whether there is/was PTX.   I spent 70 minutes dedicated to the care of this patient on the date of this encounter to include pre-visit review of records, personally calling Dr. Carolee Rota office and discussing his case and imaging, face-to-face time with the patient discussing conditions above, post visit ordering of testing, clinical documentation with the electronic health record, and communicating necessary findings to members of the patients care team.         Omar Person, MD [facility] 07/29/2022 12:57 PM

## 2022-07-30 ENCOUNTER — Inpatient Hospital Stay: Payer: Medicare Other

## 2022-07-30 ENCOUNTER — Encounter: Payer: Self-pay | Admitting: Student

## 2022-07-30 ENCOUNTER — Inpatient Hospital Stay: Payer: Medicare Other | Admitting: Hematology and Oncology

## 2022-07-30 ENCOUNTER — Ambulatory Visit: Payer: Medicare Other | Admitting: Student

## 2022-07-30 VITALS — BP 128/64 | HR 66 | Temp 97.7°F | Ht 67.0 in | Wt 128.0 lb

## 2022-07-30 DIAGNOSIS — R0609 Other forms of dyspnea: Secondary | ICD-10-CM

## 2022-07-30 NOTE — Patient Instructions (Addendum)
-   you'll be called to schedule CT Chest - call if you have any questions about your CT Chest before next visit - PFTs and clinic visit in 8 weeks

## 2022-08-04 ENCOUNTER — Ambulatory Visit: Payer: Medicare Other | Admitting: Student

## 2022-08-07 ENCOUNTER — Other Ambulatory Visit: Payer: Self-pay

## 2022-08-07 ENCOUNTER — Other Ambulatory Visit: Payer: Self-pay | Admitting: Hematology and Oncology

## 2022-08-07 ENCOUNTER — Inpatient Hospital Stay: Payer: Medicare Other | Attending: Hematology and Oncology

## 2022-08-07 ENCOUNTER — Inpatient Hospital Stay: Payer: Medicare Other | Admitting: Hematology and Oncology

## 2022-08-07 VITALS — BP 111/59 | HR 66 | Temp 97.5°F | Resp 14 | Wt 128.4 lb

## 2022-08-07 DIAGNOSIS — D500 Iron deficiency anemia secondary to blood loss (chronic): Secondary | ICD-10-CM

## 2022-08-07 DIAGNOSIS — K565 Intestinal adhesions [bands], unspecified as to partial versus complete obstruction: Secondary | ICD-10-CM

## 2022-08-07 DIAGNOSIS — Z79899 Other long term (current) drug therapy: Secondary | ICD-10-CM | POA: Insufficient documentation

## 2022-08-07 DIAGNOSIS — Z7982 Long term (current) use of aspirin: Secondary | ICD-10-CM | POA: Diagnosis not present

## 2022-08-07 DIAGNOSIS — D509 Iron deficiency anemia, unspecified: Secondary | ICD-10-CM | POA: Diagnosis not present

## 2022-08-07 DIAGNOSIS — Z87891 Personal history of nicotine dependence: Secondary | ICD-10-CM | POA: Diagnosis not present

## 2022-08-07 DIAGNOSIS — D649 Anemia, unspecified: Secondary | ICD-10-CM

## 2022-08-07 LAB — CBC WITH DIFFERENTIAL (CANCER CENTER ONLY)
Abs Immature Granulocytes: 0.02 10*3/uL (ref 0.00–0.07)
Basophils Absolute: 0.1 10*3/uL (ref 0.0–0.1)
Basophils Relative: 1 %
Eosinophils Absolute: 1.2 10*3/uL — ABNORMAL HIGH (ref 0.0–0.5)
Eosinophils Relative: 18 %
HCT: 36.5 % — ABNORMAL LOW (ref 39.0–52.0)
Hemoglobin: 12 g/dL — ABNORMAL LOW (ref 13.0–17.0)
Immature Granulocytes: 0 %
Lymphocytes Relative: 18 %
Lymphs Abs: 1.2 10*3/uL (ref 0.7–4.0)
MCH: 32.2 pg (ref 26.0–34.0)
MCHC: 32.9 g/dL (ref 30.0–36.0)
MCV: 97.9 fL (ref 80.0–100.0)
Monocytes Absolute: 0.6 10*3/uL (ref 0.1–1.0)
Monocytes Relative: 8 %
Neutro Abs: 3.7 10*3/uL (ref 1.7–7.7)
Neutrophils Relative %: 55 %
Platelet Count: 314 10*3/uL (ref 150–400)
RBC: 3.73 MIL/uL — ABNORMAL LOW (ref 4.22–5.81)
RDW: 13.6 % (ref 11.5–15.5)
WBC Count: 6.8 10*3/uL (ref 4.0–10.5)
nRBC: 0 % (ref 0.0–0.2)

## 2022-08-07 LAB — RETIC PANEL
Immature Retic Fract: 5.5 % (ref 2.3–15.9)
RBC.: 3.7 MIL/uL — ABNORMAL LOW (ref 4.22–5.81)
Retic Count, Absolute: 32.9 10*3/uL (ref 19.0–186.0)
Retic Ct Pct: 0.9 % (ref 0.4–3.1)
Reticulocyte Hemoglobin: 35 pg (ref >27.9)

## 2022-08-07 LAB — CMP (CANCER CENTER ONLY)
ALT: 13 U/L (ref 0–44)
AST: 16 U/L (ref 15–41)
Albumin: 3.7 g/dL (ref 3.5–5.0)
Alkaline Phosphatase: 61 U/L (ref 38–126)
Anion gap: 4 — ABNORMAL LOW (ref 5–15)
BUN: 13 mg/dL (ref 8–23)
CO2: 33 mmol/L — ABNORMAL HIGH (ref 22–32)
Calcium: 9.5 mg/dL (ref 8.9–10.3)
Chloride: 103 mmol/L (ref 98–111)
Creatinine: 0.74 mg/dL (ref 0.61–1.24)
GFR, Estimated: >60 mL/min (ref >60)
Glucose, Bld: 128 mg/dL — ABNORMAL HIGH (ref 70–99)
Potassium: 4.2 mmol/L (ref 3.5–5.1)
Sodium: 140 mmol/L (ref 135–145)
Total Bilirubin: 0.6 mg/dL (ref 0.3–1.2)
Total Protein: 7.2 g/dL (ref 6.5–8.1)

## 2022-08-07 LAB — IRON AND IRON BINDING CAPACITY (CC-WL,HP ONLY)
Iron: 63 ug/dL (ref 45–182)
Saturation Ratios: 22 % (ref 17.9–39.5)
TIBC: 286 ug/dL (ref 250–450)
UIBC: 223 ug/dL (ref 117–376)

## 2022-08-07 LAB — FERRITIN: Ferritin: 94 ng/mL (ref 24–336)

## 2022-08-07 NOTE — Progress Notes (Signed)
[facility] [phone]   [phone]  PROGRESS NOTE  Patient Care Team: [name], MD as PCP - General (Internal Medicine) Jaci Standard, MD as Consulting Physician (Hematology and Oncology)  Hematological/Oncological History #Normocytic Anemia #Iron Deficiency Anemia 1) 01/01/2017: WBC 5.1, Hgb 11.0, Plt 254, MCV 91.6 2) 12/07/2017: WBC 8.0, Hgb 11.9, Plt 352, MCV 91.4. Iron 76, TIBC 359, Sat 21%, Ferritin 31 3) 03/09/2019: WBC 14.3, Hgb 11.1, Plt 374, MCV 88.8 4) 05/22/2019: WBC 5.5, Hgb 10.7, Plt 311, MCV 83.7. Routine PCP visit. 5) 06/14/2019: establish care with Dr. Leonides Schanz. WBC 5.0, Hgb 10.3, MCV 84.9, Plt 320 6) 09/14/2019: WBC 6.2, Hgb 10.9, MCV 91.2, Plt 209 7)  11/01/2019: WBC 9.2, Hgb 12.6, MVC 97.2, Plt 299 8) 11/05/2019: patient admitted with SBO, instructed to stop PO iron indefinitely.  9) 02/28/2020: admitted with SBO, Hgb drop to 9.6 while inpatient 10) 05/17/2020: Hgb 11.7, increased without intervention.  11) 09/05/2020: WBC 6.6, Hgb 10.9, MCV 94.6, Plt 259 12) 6/6-6/17/2022: IV feraheme 510 mg x 2 doses  13) 6/21-6/30/2023: IV feraheme 510 mg x 2 doses  14) 02/20/2022: WBC 5.8, Hgb 11.5, MCV 98.5, Plt 262  Interval History:  Steven [name] 88 y.o. male with medical history significant for iron deficiency anemia who presents for a follow up visit. The patient's last visit was on 02/20/2022. In the interim since the last visit [name] has been at his baseline level of health.   On exam today [name] notes that everything has been good other than "the worst episode of my life".  He had an episode where he had norovirus and a "knock him right out".  He ended up in the hospital for 3 days and took him 2 weeks to fully recover.  He had awful nausea,, diarrhea and was severely dehydrated.  He reports that he has begun to rebound from this recent illness.  He notes his energy right now is about a 7.5 out of 10.  He reports that he has been  doing his best to try to walk is much as he can to try to build up his strength and energy.  He is also eating quite well but is concerned that his legs get weak when he exercises too much.  He continues take his iron pill without difficulty, without any constipation, nausea. He denies having fevers, chills, sweats, nausea, vomiting or diarrhea.  Otherwise a full 10 point ROS is listed below.   MEDICAL HISTORY:  Past Medical History:  Diagnosis Date   Allergy    enviromental   Anemia 2017   Anxiety    Arthritis    ankylosing spodilitis   Blood transfusion without reported diagnosis    during surgery   Cataract    Depression    Dyspnea    with exertion - had Echo done 09/29/16   History of kidney stones    Hyperlipidemia    Intestinal obstruction (HCC)    Pneumonia    as a child    SURGICAL HISTORY: Past Surgical History:  Procedure Laterality Date   ABDOMINAL SURGERY     had abcess   APPENDECTOMY     CHOLECYSTECTOMY     with lysis of adhesions   COLONOSCOPY     COLOSTOMY     COLOSTOMY REVERSAL     EYE SURGERY Left    scar tissue removed from cornea   EYE SURGERY Right    cataract surgery with lens implant  JOINT REPLACEMENT Right    hip  x 2 1999 and 2007   SHOULDER ARTHROSCOPY WITH ROTATOR CUFF REPAIR AND SUBACROMIAL DECOMPRESSION Left 03/02/2013   Procedure: LEFT SHOULDER ARTHROSCOPY WITH SUBACROMIAL DECOMPRESSION DISTAL CALVICLE RESECTION AND ROTATOR CUFF REPAIR ;  Surgeon: Senaida Lange, MD;  Location: [facility] OR;  Service: Orthopedics;  Laterality: Left;   TOTAL HIP REVISION Right 11/06/2016   Procedure: TOTAL HIP REVISION OF THE ACETABULAR COMPONENT;  Surgeon: Gean Birchwood, MD;  Location: [facility] OR;  Service: Orthopedics;  Laterality: Right;   TRANSCAROTID ARTERY REVASCULARIZATION  Left 03/31/2021   Procedure: TRANSCAROTID ARTERY REVASCULARIZATION;  Surgeon: Victorino Sparrow, MD;  Location: [facility] OR;  Service: Vascular;  Laterality: Left;   ULTRASOUND GUIDANCE FOR VASCULAR  ACCESS Right 03/31/2021   Procedure: ULTRASOUND GUIDANCE FOR VASCULAR ACCESS;  Surgeon: Victorino Sparrow, MD;  Location: [facility] OR;  Service: Vascular;  Laterality: Right;    SOCIAL HISTORY: Social History   Socioeconomic History   Marital status: Married    Spouse name: Not on file   Number of children: 2   Years of education: Not on file   Highest education level: Not on file  Occupational History   Occupation: retired  Tobacco Use   Smoking status: Former    Packs/day: 1.00    Years: 9.00    Additional pack years: 0.00    Total pack years: 9.00    Types: Cigarettes    Quit date: 04/13/1965    Years since quitting: 57.3    Passive exposure: Never   Smokeless tobacco: Never  Vaping Use   Vaping Use: Never used  Substance and Sexual Activity   Alcohol use: Yes    Comment: 3 beers or glasses of wine a day--reports stopped ETOH 11/01/16    Drug use: No   Sexual activity: Never  Other Topics Concern   Not on file  Social History Narrative   Not on file   Social Determinants of Health   Financial Resource Strain: Low Risk  (03/09/2019)   Overall Financial Resource Strain (CARDIA)    Difficulty of Paying Living Expenses: Not hard at all  Food Insecurity: No Food Insecurity (07/11/2022)   Hunger Vital Sign    Worried About Running Out of Food in the Last Year: Never true    Ran Out of Food in the Last Year: Never true  Transportation Needs: No Transportation Needs (07/11/2022)   PRAPARE - Administrator, Civil Service (Medical): No    Lack of Transportation (Non-Medical): No  Physical Activity: Insufficiently Active (03/09/2019)   Exercise Vital Sign    Days of Exercise per Week: 5 days    Minutes of Exercise per Session: 10 min  Stress: No Stress Concern Present (03/09/2019)   [employer] of Occupational Health - Occupational Stress Questionnaire    Feeling of Stress : Not at all  Social Connections: Unknown (03/09/2019)   Social Connection and Isolation  Panel [NHANES]    Frequency of Communication with Friends and Family: Patient declined    Frequency of Social Gatherings with Friends and Family: Patient declined    Attends Religious Services: Patient declined    Database administrator or Organizations: Patient declined    Attends Banker Meetings: Patient declined    Marital Status: Patient declined  Intimate Partner Violence: Not At Risk (07/11/2022)   Humiliation, Afraid, Rape, and Kick questionnaire    Fear of Current or Ex-Partner: No    Emotionally Abused: No  Physically Abused: No    Sexually Abused: No    FAMILY HISTORY: Family History  Problem Relation Age of Onset   Heart attack Mother    Diabetes Mother    Breast cancer Mother    Heart attack Maternal Grandfather    Pancreatic cancer Brother    Colon cancer Neg Hx    Esophageal cancer Neg Hx    Stomach cancer Neg Hx     ALLERGIES:  is allergic to indomethacin and lactose intolerance (gi).  MEDICATIONS:  Current Outpatient Medications  Medication Sig Dispense Refill   aspirin EC 325 MG EC tablet Take 1 tablet (325 mg total) by mouth daily. 30 tablet 1   Biotin 5000 MCG TABS Take 1 tablet by mouth daily.     Calcium Citrate 250 MG TABS Take 1 tablet by mouth daily.     cholecalciferol (VITAMIN D) 1000 units tablet Take 1,000 Units by mouth daily.     citalopram (CELEXA) 20 MG tablet Take 20 mg by mouth daily.      clopidogrel (PLAVIX) 75 MG tablet Take 1 tablet (75 mg total) by mouth daily. 30 tablet 1   ferrous sulfate 325 (65 FE) MG tablet Take 1 tablet (325 mg total) by mouth daily with breakfast. 30 tablet 0   folic acid (FOLVITE) 1 MG tablet Take 1 mg by mouth in the morning, at noon, and at bedtime.     GEMTESA 75 MG TABS Take 1 tablet by mouth daily.     ipratropium (ATROVENT) 0.06 % nasal spray Place 2 sprays into both nostrils 2 (two) times daily as needed (allergies).     methotrexate (RHEUMATREX) 2.5 MG tablet Take 20 mg by mouth once a  week. TAKE 8 TABLETS (20 MG) BY MOUTH ONCE A WEEK.     Multiple Vitamins-Minerals (PRESERVISION AREDS 2 PO) Take 1 tablet by mouth in the morning and at bedtime.     pantoprazole (PROTONIX) 40 MG tablet Take 1 tablet (40 mg total) by mouth daily. 30 tablet 1   rosuvastatin (CRESTOR) 20 MG tablet Take 1 tablet (20 mg total) by mouth at bedtime. 30 tablet 1   Wheat Dextrin (BENEFIBER) POWD Take 1 Scoop by mouth daily.     No current facility-administered medications for this visit.    REVIEW OF SYSTEMS:   Constitutional: ( - ) fevers, ( - )  chills , ( - ) night sweats Eyes: ( - ) blurriness of vision, ( - ) double vision, ( - ) watery eyes Ears, nose, mouth, throat, and face: ( - ) mucositis, ( - ) sore throat Respiratory: ( - ) cough, ( - ) dyspnea, ( - ) wheezes Cardiovascular: ( - ) palpitation, ( - ) chest discomfort, ( - ) lower extremity swelling Gastrointestinal:  ( - ) nausea, ( - ) heartburn, ( - ) change in bowel habits Skin: ( - ) abnormal skin rashes Lymphatics: ( - ) new lymphadenopathy, ( - ) easy bruising Neurological: ( - ) numbness, ( - ) tingling, ( - ) new weaknesses Behavioral/Psych: ( - ) mood change, ( - ) new changes  All other systems were reviewed with the patient and are negative.  PHYSICAL EXAMINATION: ECOG PERFORMANCE STATUS: 1 - Symptomatic but completely ambulatory  There were no vitals filed for this visit.  There were no vitals filed for this visit.   GENERAL: well appearing elderly Caucasian male in NAD  SKIN: skin color, texture, turgor are normal, no rashes or significant  lesions EYES: conjunctiva are pink and non-injected, sclera clear LUNGS: clear to auscultation and percussion with normal breathing effort HEART: regular rate & rhythm and no murmurs and no lower extremity edema ABDOMEN: soft, non-tender, non-distended, normal bowel sounds. No HSM appreciated. Musculoskeletal: no cyanosis of digits and no clubbing  PSYCH: alert & oriented x 3,  fluent speech NEURO: no focal motor/sensory deficits  LABORATORY DATA:  I have reviewed the data as listed    Latest Ref Rng & Units 07/14/2022    6:16 AM 07/13/2022    4:54 AM 07/12/2022    5:33 AM  CBC  WBC 4.0 - 10.5 K/uL 8.1  7.4  6.6   Hemoglobin 13.0 - 17.0 g/dL 16.1  09.6  9.8   Hematocrit 39.0 - 52.0 % 31.5  30.3  30.2   Platelets 150 - 400 K/uL 194  155  157        Latest Ref Rng & Units 07/14/2022    6:16 AM 07/13/2022    4:54 AM 07/12/2022    5:33 AM  CMP  Glucose 70 - 99 mg/dL 045  91  88   BUN 8 - 23 mg/dL 9  11  23    Creatinine 0.61 - 1.24 mg/dL 4.09  8.11  9.14   Sodium 135 - 145 mmol/L 135  134  135   Potassium 3.5 - 5.1 mmol/L 3.4  4.0  2.8   Chloride 98 - 111 mmol/L 104  105  107   CO2 22 - 32 mmol/L 24  23  20    Calcium 8.9 - 10.3 mg/dL 8.1  7.9  7.3     No results found for: "MPROTEIN" Lab Results  Component Value Date   KPAFRELGTCHN 16.5 06/14/2019   LAMBDASER 14.3 06/14/2019   KAPLAMBRATIO 1.15 06/14/2019    RADIOGRAPHIC STUDIES: DG Abd 1 View  Result Date: 07/11/2022 CLINICAL DATA:  Vomiting and diarrhea EXAM: ABDOMEN - 1 VIEW COMPARISON:  11/04/2019, 02/28/2020 FINDINGS: Supine frontal view of the abdomen and pelvis was obtained, excluding the left flank and pubic symphysis by collimation. Nonobstructive bowel gas pattern. Gas is seen throughout the colon to the level of the rectum. No masses or abnormal calcifications. Stable findings of ankylosing spondylitis. Right hip arthroplasty. IMPRESSION: 1. Unremarkable bowel gas pattern. 2. Stable bony changes of ankylosing spondylitis. Electronically Signed   By: Sharlet Salina M.D.   On: 07/11/2022 20:19   DG Chest 2 View  Result Date: 07/11/2022 CLINICAL DATA:  Weakness.  Possible sepsis. EXAM: CHEST - 2 VIEW COMPARISON:  06/03/2021 FINDINGS: Probable ankylosing spondylitis as evidenced by flowing syndesmophytes throughout the thoracic spine. Numerous leads and wires project over the chest on the frontal.  Midline trachea. Borderline cardiomegaly. No pleural effusion or pneumothorax. Nonspecific mild interstitial thickening. No lobar consolidation. Widening of left acromioclavicular joint may be postoperative. IMPRESSION: Borderline cardiomegaly with interstitial thickening which is nonspecific. This could represent mild pulmonary venous congestion. No overt congestive failure. No lobar consolidation to explain sepsis. Electronically Signed   By: Jeronimo Greaves M.D.   On: 07/11/2022 16:50    ASSESSMENT & PLAN Steven [name] 88 y.o. male with medical history significant for iron deficiency anemia who presents for a follow up visit.  After review of the labs and discussion with the patient the findings are most consistent with continued iron deficiency anemia.  GI evaluations: EGD 03/11/2017: normal stomach and esophagus. Mild duodenal stenosis Colonoscopy 11/17/2013: moderate diverticulosis  At this time there is concern for recurrent GI  bleed.  His hemoglobin has trended downward from 11.4 on 06/03/2021 to 8.2 on 09/25/2021.  GI loss is the most likely etiology.  He has been evaluated by gastroenterology who noted that endoscopic intervention is not indicated at this time.  #Normocytic Anemia # Iron Deficiency Anemia of Unclear Etiology -- Today hemoglobin 12.0, WBC 6.8, MCV 97.9, Plt 314 --previously patient has to stop his ferrous sulfate 325mg  PO daily due to recurrent SBOs, as recommended by his surgical team.  --Patient has restarted his ferrous sulfate 325 mg p.o. daily and is tolerating it well. --repeat Iron panel, ferritin, and reticulocyte panel today.  --will recommend IV iron to the patient if iron levels remain low  --Mr. Nobile noted he had negative FOB cards and was evaluated by gastroenterology who declined to perform endoscopic intervention. --RTC 6 months   No orders of the defined types were placed in this encounter.  All questions were answered. The patient knows to call the clinic  with any problems, questions or concerns.  A total of more than 30 minutes were spent on this encounter and over half of that time was spent on counseling and coordination of care as outlined above.   Ulysees Barns, MD Department of Hematology/Oncology [facility] at [facility] Phone: [phone] Pager: [phone] Email: [email or web address]  08/07/2022 6:56 AM

## 2022-08-14 ENCOUNTER — Ambulatory Visit
Admission: RE | Admit: 2022-08-14 | Discharge: 2022-08-14 | Disposition: A | Discharge status: Home / Self Care | Payer: Medicare Other | Source: Ambulatory Visit | Attending: Student | Admitting: Student

## 2022-08-14 DIAGNOSIS — R0609 Other forms of dyspnea: Secondary | ICD-10-CM

## 2022-08-14 DIAGNOSIS — R0602 Shortness of breath: Secondary | ICD-10-CM | POA: Diagnosis not present

## 2022-08-20 ENCOUNTER — Ambulatory Visit: Payer: Medicare Other | Admitting: Hematology and Oncology

## 2022-08-20 ENCOUNTER — Other Ambulatory Visit: Payer: Medicare Other

## 2022-08-25 DIAGNOSIS — Z79899 Other long term (current) drug therapy: Secondary | ICD-10-CM | POA: Diagnosis not present

## 2022-08-25 DIAGNOSIS — H35711 Central serous chorioretinopathy, right eye: Secondary | ICD-10-CM | POA: Diagnosis not present

## 2022-08-25 DIAGNOSIS — M452 Ankylosing spondylitis of cervical region: Secondary | ICD-10-CM | POA: Diagnosis not present

## 2022-08-25 DIAGNOSIS — Z961 Presence of intraocular lens: Secondary | ICD-10-CM | POA: Diagnosis not present

## 2022-08-25 DIAGNOSIS — H44111 Panuveitis, right eye: Secondary | ICD-10-CM | POA: Diagnosis not present

## 2022-09-01 ENCOUNTER — Telehealth: Payer: Self-pay | Admitting: Hematology and Oncology

## 2022-09-09 ENCOUNTER — Ambulatory Visit (INDEPENDENT_AMBULATORY_CARE_PROVIDER_SITE_OTHER): Payer: Medicare Other | Admitting: Student

## 2022-09-09 ENCOUNTER — Encounter (HOSPITAL_BASED_OUTPATIENT_CLINIC_OR_DEPARTMENT_OTHER): Payer: Medicare Other

## 2022-09-09 DIAGNOSIS — R0609 Other forms of dyspnea: Secondary | ICD-10-CM | POA: Diagnosis not present

## 2022-09-09 LAB — PULMONARY FUNCTION TEST
DL/VA % pred: 121 %
DL/VA: 4.71 ml/min/mmHg/L
DLCO cor % pred: 91 %
DLCO cor: 19.1 ml/min/mmHg
DLCO unc % pred: 81 %
DLCO unc: 16.98 ml/min/mmHg
FEF 25-75 Pre: 1.16 L/sec
FEF2575-%Pred-Pre: 85 %
FEV1-%Pred-Pre: 78 %
FEV1-Pre: 1.7 L
FEV1FVC-%Pred-Pre: 100 %
FEV6-%Pred-Pre: 82 %
FEV6-Pre: 2.4 L
FEV6FVC-%Pred-Pre: 109 %
FVC-%Pred-Pre: 76 %
FVC-Pre: 2.4 L
Pre FEV1/FVC ratio: 71 %
Pre FEV6/FVC Ratio: 100 %
RV % pred: 132 %
RV: 3.37 L
TLC % pred: 86 %
TLC: 5.41 L

## 2022-09-09 NOTE — Progress Notes (Unsigned)
Synopsis: Referred for spontaneous pneumothorax by [name], MD  Subjective:   PATIENT ID: Steven [name] GENDER: male DOB: [birth year], MRN: [id number]  No chief complaint on file.  88yM with history of ankylosing spondylitis - not on anything presently and no recent lung function tests, uveitis on methotrexate (has been on it for 2 years) former smoker referred for spontaneous pneumothorax.  He woke up one morning and had pain behind left clavicle which persisted, starting in early July or August but he's unsure of the date. He hasn't had any worsened dyspnea. No recent preceding trauma. His CP has resolved not long after he went to see Dr. Renne Crigler.   He has never previously had a pneumothorax.  I called and spoke with Dr. Renne Crigler this afternoon. He says that while the official radiology read was that there was no pneumothorax on an x-ray 11/15/20, he thought there may be subtle left apical PTX and that's why he sent him to [place]. We will request the images.  Lives in [place]. Never has lived outside of [place]. Worked in [employer]. Smoked for 10 years, 1ppd, quit in 1967.  He has no family history of lung disease.  Interval HPI  He was referred again to our office for crackles on exam and abnormal CXR.   He says he has had increased dyspnea over last 6 months. Cough related he thinks to sinus drainage as well. Atrovent nasal spray has helped some. He has some orthopnea, cough when lies down.   Acknowledges some trouble with swallowing food/liquid several days/week or so. Has also been on MTX for at least 2 years.   Interval HPI HRCT Chest 08/19/22 with bronchiolitis LLL  PFT 09/09/22 with reduced vital capacity   Otherwise pertinent review of systems is negative.  Past Medical History:  Diagnosis Date   Allergy    enviromental   Anemia 2017   Anxiety    Arthritis    ankylosing spodilitis   Blood transfusion without reported diagnosis    during surgery   Cataract    Depression     Dyspnea    with exertion - had Echo done 09/29/16   History of kidney stones    Hyperlipidemia    Intestinal obstruction (HCC)    Pneumonia    as a child     Family History  Problem Relation Age of Onset   Heart attack Mother    Diabetes Mother    Breast cancer Mother    Heart attack Maternal Grandfather    Pancreatic cancer Brother    Colon cancer Neg Hx    Esophageal cancer Neg Hx    Stomach cancer Neg Hx      Past Surgical History:  Procedure Laterality Date   ABDOMINAL SURGERY     had abcess   APPENDECTOMY     CHOLECYSTECTOMY     with lysis of adhesions   COLONOSCOPY     COLOSTOMY     COLOSTOMY REVERSAL     EYE SURGERY Left    scar tissue removed from cornea   EYE SURGERY Right    cataract surgery with lens implant   JOINT REPLACEMENT Right    hip  x 2 1999 and 2007   SHOULDER ARTHROSCOPY WITH ROTATOR CUFF REPAIR AND SUBACROMIAL DECOMPRESSION Left 03/02/2013   Procedure: LEFT SHOULDER ARTHROSCOPY WITH SUBACROMIAL DECOMPRESSION DISTAL CALVICLE RESECTION AND ROTATOR CUFF REPAIR ;  Surgeon: Senaida Lange, MD;  Location: [facility] OR;  Service: Orthopedics;  Laterality: Left;  TOTAL HIP REVISION Right 11/06/2016   Procedure: TOTAL HIP REVISION OF THE ACETABULAR COMPONENT;  Surgeon: Gean Birchwood, MD;  Location: [facility] OR;  Service: Orthopedics;  Laterality: Right;   TRANSCAROTID ARTERY REVASCULARIZATION  Left 03/31/2021   Procedure: TRANSCAROTID ARTERY REVASCULARIZATION;  Surgeon: Victorino Sparrow, MD;  Location: [facility] OR;  Service: Vascular;  Laterality: Left;   ULTRASOUND GUIDANCE FOR VASCULAR ACCESS Right 03/31/2021   Procedure: ULTRASOUND GUIDANCE FOR VASCULAR ACCESS;  Surgeon: Victorino Sparrow, MD;  Location: [facility] OR;  Service: Vascular;  Laterality: Right;    Social History   Socioeconomic History   Marital status: Married    Spouse name: Not on file   Number of children: 2   Years of education: Not on file   Highest education level: Not on file  Occupational History    Occupation: retired  Tobacco Use   Smoking status: Former    Packs/day: 1.00    Years: 9.00    Additional pack years: 0.00    Total pack years: 9.00    Types: Cigarettes    Quit date: 04/13/1965    Years since quitting: 57.4    Passive exposure: Never   Smokeless tobacco: Never  Vaping Use   Vaping Use: Never used  Substance and Sexual Activity   Alcohol use: Yes    Comment: 3 beers or glasses of wine a day--reports stopped ETOH 11/01/16    Drug use: No   Sexual activity: Never  Other Topics Concern   Not on file  Social History Narrative   Not on file   Social Determinants of Health   Financial Resource Strain: Low Risk  (03/09/2019)   Overall Financial Resource Strain (CARDIA)    Difficulty of Paying Living Expenses: Not hard at all  Food Insecurity: No Food Insecurity (07/11/2022)   Hunger Vital Sign    Worried About Running Out of Food in the Last Year: Never true    Ran Out of Food in the Last Year: Never true  Transportation Needs: No Transportation Needs (07/11/2022)   PRAPARE - Administrator, Civil Service (Medical): No    Lack of Transportation (Non-Medical): No  Physical Activity: Insufficiently Active (03/09/2019)   Exercise Vital Sign    Days of Exercise per Week: 5 days    Minutes of Exercise per Session: 10 min  Stress: No Stress Concern Present (03/09/2019)   [employer] of Occupational Health - Occupational Stress Questionnaire    Feeling of Stress : Not at all  Social Connections: Unknown (03/09/2019)   Social Connection and Isolation Panel [NHANES]    Frequency of Communication with Friends and Family: Patient declined    Frequency of Social Gatherings with Friends and Family: Patient declined    Attends Religious Services: Patient declined    Database administrator or Organizations: Patient declined    Attends Banker Meetings: Patient declined    Marital Status: Patient declined  Intimate Partner Violence: Not At Risk  (07/11/2022)   Humiliation, Afraid, Rape, and Kick questionnaire    Fear of Current or Ex-Partner: No    Emotionally Abused: No    Physically Abused: No    Sexually Abused: No     Allergies  Allergen Reactions   Indomethacin Hives and Other (See Comments)   Lactose Intolerance (Gi)     GI UPSET     Outpatient Medications Prior to Visit  Medication Sig Dispense Refill   aspirin EC 325 MG EC tablet Take 1 tablet (  325 mg total) by mouth daily. 30 tablet 1   Biotin 5000 MCG TABS Take 1 tablet by mouth daily.     Calcium Citrate 250 MG TABS Take 1 tablet by mouth daily.     cholecalciferol (VITAMIN D) 1000 units tablet Take 1,000 Units by mouth daily.     citalopram (CELEXA) 20 MG tablet Take 20 mg by mouth daily.      clopidogrel (PLAVIX) 75 MG tablet Take 1 tablet (75 mg total) by mouth daily. 30 tablet 1   ferrous sulfate 325 (65 FE) MG tablet Take 1 tablet (325 mg total) by mouth daily with breakfast. 30 tablet 0   folic acid (FOLVITE) 1 MG tablet Take 1 mg by mouth in the morning, at noon, and at bedtime.     GEMTESA 75 MG TABS Take 1 tablet by mouth daily.     ipratropium (ATROVENT) 0.06 % nasal spray Place 2 sprays into both nostrils 2 (two) times daily as needed (allergies).     methotrexate (RHEUMATREX) 2.5 MG tablet Take 20 mg by mouth once a week. TAKE 8 TABLETS (20 MG) BY MOUTH ONCE A WEEK.     Multiple Vitamins-Minerals (PRESERVISION AREDS 2 PO) Take 1 tablet by mouth in the morning and at bedtime.     pantoprazole (PROTONIX) 40 MG tablet Take 1 tablet (40 mg total) by mouth daily. 30 tablet 1   rosuvastatin (CRESTOR) 20 MG tablet Take 1 tablet (20 mg total) by mouth at bedtime. 30 tablet 1   Wheat Dextrin (BENEFIBER) POWD Take 1 Scoop by mouth daily.     No facility-administered medications prior to visit.       Objective:   Physical Exam:  General appearance: 88 y.o., male, NAD, conversant  Eyes: anicteric sclerae, moist conjunctivae; no lid-lag; PERRL, tracking  appropriately HENT: NCAT; oropharynx, MMM, no mucosal ulcerations; normal hard and soft palate Neck: Trachea midline; no lymphadenopathy, no JVD Lungs: coarse crackles, with normal respiratory effort. Marked kyphosis CV: RRR, no MRGs  Abdomen: Soft, non-tender; non-distended, BS present  Extremities: No peripheral edema, radial and DP pulses present bilaterally  Skin: Normal temperature, turgor and texture; no rash Psych: Appropriate affect Neuro: Alert and oriented to person and place, no focal deficit    There were no vitals filed for this visit.     on  RA BMI Readings from Last 3 Encounters:  08/07/22 20.11 kg/m  07/30/22 20.05 kg/m  07/11/22 21.13 kg/m   Wt Readings from Last 3 Encounters:  08/07/22 128 lb 6.4 oz (58.2 kg)  07/30/22 128 lb (58.1 kg)  07/11/22 134 lb 14.7 oz (61.2 kg)     CBC    Component Value Date/Time   WBC 6.8 08/07/2022 0844   WBC 8.1 07/14/2022 0616   RBC 3.70 (L) 08/07/2022 0844   RBC 3.73 (L) 08/07/2022 0844   HGB 12.0 (L) 08/07/2022 0844   HGB 13.0 04/20/2012 0425   HCT 36.5 (L) 08/07/2022 0844   HCT 38.5 (L) 04/20/2012 0425   PLT 314 08/07/2022 0844   PLT 207 04/20/2012 0425   MCV 97.9 08/07/2022 0844   MCV 95 04/20/2012 0425   MCH 32.2 08/07/2022 0844   MCHC 32.9 08/07/2022 0844   RDW 13.6 08/07/2022 0844   RDW 13.2 04/20/2012 0425   LYMPHSABS 1.2 08/07/2022 0844   LYMPHSABS 0.9 (L) 04/20/2012 0425   MONOABS 0.6 08/07/2022 0844   MONOABS 0.6 04/20/2012 0425   EOSABS 1.2 (H) 08/07/2022 0844   EOSABS 0.2 04/20/2012  0425   BASOSABS 0.1 08/07/2022 0844   BASOSABS 0.0 04/20/2012 0425      Chest Imaging: CT A/P 02/2020 reviewed by me and lung bases are unremarkable  CXR today reviewed by me with possible left apical PTX but I'm not 100% convinced I see one at all, awaiting final radiology read.  CXR 07/11/22 with overall lesser degree of penetration than prior  Pulmonary Functions Testing Results:    Latest Ref Rng &  Units 09/09/2022    8:06 AM  PFT Results  FVC-Pre L 2.40  P  FVC-Predicted Pre % 76  P  Pre FEV1/FVC % % 71  P  FEV1-Pre L 1.70  P  FEV1-Predicted Pre % 78  P  DLCO uncorrected ml/min/mmHg 16.98  P  DLCO UNC% % 81  P  DLCO corrected ml/min/mmHg 19.10  P  DLCO COR %Predicted % 91  P  DLVA Predicted % 121  P  TLC L 5.41  P  TLC % Predicted % 86  P  RV % Predicted % 132  P    P Preliminary result        Assessment & Plan:   # DOE # Chronic cough Agree he has coarse crackles on exam. If he has ILD then could reflect chronic aspiration, MTX toxicity or AS-related.   Plan: - you'll be called to schedule CT Chest - call if you have any questions about your CT Chest before next visit - PFTs and clinic visit in 8 weeks        Omar Person, MD [facility] 09/09/2022 6:53 PM

## 2022-09-09 NOTE — Progress Notes (Signed)
Full PFT performed today without Post Cleda Daub. Patient could not pass Pre Cleda Daub.

## 2022-09-09 NOTE — Patient Instructions (Signed)
Full PFT performed today without Post Cleda Daub due to patient not passing Pre Cleda Daub.

## 2022-09-10 ENCOUNTER — Ambulatory Visit: Payer: Medicare Other | Admitting: Student

## 2022-09-10 ENCOUNTER — Encounter: Payer: Self-pay | Admitting: Student

## 2022-09-10 VITALS — BP 106/58 | HR 63 | Temp 97.5°F | Ht 67.0 in | Wt 127.0 lb

## 2022-09-10 DIAGNOSIS — J219 Acute bronchiolitis, unspecified: Secondary | ICD-10-CM | POA: Diagnosis not present

## 2022-09-10 NOTE — Patient Instructions (Signed)
-   if low grade fever, coughing up phlegm come see [place] - may need to get sample of sputum to look for infection or consider bronchoscopy. There may be a role for an inhaler - although you don't have COPD based on our PFT there is something called air trapping. This can be a feature of asthma, reactive airways, COPD and other obstructive lung diseases. We didn't complete full set of testing to see if there's any evidence of asthma/reactive airways.

## 2022-09-23 DIAGNOSIS — E1122 Type 2 diabetes mellitus with diabetic chronic kidney disease: Secondary | ICD-10-CM | POA: Diagnosis not present

## 2022-09-25 DIAGNOSIS — I6522 Occlusion and stenosis of left carotid artery: Secondary | ICD-10-CM | POA: Diagnosis not present

## 2022-09-25 DIAGNOSIS — E118 Type 2 diabetes mellitus with unspecified complications: Secondary | ICD-10-CM | POA: Diagnosis not present

## 2022-09-25 DIAGNOSIS — M459 Ankylosing spondylitis of unspecified sites in spine: Secondary | ICD-10-CM | POA: Diagnosis not present

## 2022-09-25 DIAGNOSIS — J309 Allergic rhinitis, unspecified: Secondary | ICD-10-CM | POA: Diagnosis not present

## 2022-09-25 DIAGNOSIS — Z23 Encounter for immunization: Secondary | ICD-10-CM | POA: Diagnosis not present

## 2022-09-25 DIAGNOSIS — Z8673 Personal history of transient ischemic attack (TIA), and cerebral infarction without residual deficits: Secondary | ICD-10-CM | POA: Diagnosis not present

## 2022-09-25 DIAGNOSIS — R140 Abdominal distension (gaseous): Secondary | ICD-10-CM | POA: Diagnosis not present

## 2022-09-25 DIAGNOSIS — D509 Iron deficiency anemia, unspecified: Secondary | ICD-10-CM | POA: Diagnosis not present

## 2022-09-25 DIAGNOSIS — Z0000 Encounter for general adult medical examination without abnormal findings: Secondary | ICD-10-CM | POA: Diagnosis not present

## 2022-09-28 ENCOUNTER — Encounter: Payer: Self-pay | Admitting: Hematology and Oncology

## 2022-09-29 DIAGNOSIS — D509 Iron deficiency anemia, unspecified: Secondary | ICD-10-CM | POA: Diagnosis not present

## 2022-10-21 DIAGNOSIS — Z9181 History of falling: Secondary | ICD-10-CM | POA: Diagnosis not present

## 2022-10-21 DIAGNOSIS — R42 Dizziness and giddiness: Secondary | ICD-10-CM | POA: Diagnosis not present

## 2022-10-21 DIAGNOSIS — R5383 Other fatigue: Secondary | ICD-10-CM | POA: Diagnosis not present

## 2022-10-21 DIAGNOSIS — M6281 Muscle weakness (generalized): Secondary | ICD-10-CM | POA: Diagnosis not present

## 2022-11-09 DIAGNOSIS — R2689 Other abnormalities of gait and mobility: Secondary | ICD-10-CM | POA: Diagnosis not present

## 2022-11-09 DIAGNOSIS — M6281 Muscle weakness (generalized): Secondary | ICD-10-CM | POA: Diagnosis not present

## 2022-11-17 DIAGNOSIS — R2689 Other abnormalities of gait and mobility: Secondary | ICD-10-CM | POA: Diagnosis not present

## 2022-11-17 DIAGNOSIS — M6281 Muscle weakness (generalized): Secondary | ICD-10-CM | POA: Diagnosis not present

## 2022-11-20 DIAGNOSIS — K582 Mixed irritable bowel syndrome: Secondary | ICD-10-CM | POA: Diagnosis not present

## 2022-11-20 DIAGNOSIS — R197 Diarrhea, unspecified: Secondary | ICD-10-CM | POA: Diagnosis not present

## 2022-11-24 DIAGNOSIS — R2689 Other abnormalities of gait and mobility: Secondary | ICD-10-CM | POA: Diagnosis not present

## 2022-11-24 DIAGNOSIS — M6281 Muscle weakness (generalized): Secondary | ICD-10-CM | POA: Diagnosis not present

## 2022-12-01 DIAGNOSIS — M6281 Muscle weakness (generalized): Secondary | ICD-10-CM | POA: Diagnosis not present

## 2022-12-01 DIAGNOSIS — R2689 Other abnormalities of gait and mobility: Secondary | ICD-10-CM | POA: Diagnosis not present

## 2022-12-02 DIAGNOSIS — K529 Noninfective gastroenteritis and colitis, unspecified: Secondary | ICD-10-CM | POA: Diagnosis not present

## 2022-12-08 DIAGNOSIS — M6281 Muscle weakness (generalized): Secondary | ICD-10-CM | POA: Diagnosis not present

## 2022-12-08 DIAGNOSIS — R2689 Other abnormalities of gait and mobility: Secondary | ICD-10-CM | POA: Diagnosis not present

## 2022-12-16 DIAGNOSIS — D649 Anemia, unspecified: Secondary | ICD-10-CM | POA: Diagnosis not present

## 2022-12-16 DIAGNOSIS — Z23 Encounter for immunization: Secondary | ICD-10-CM | POA: Diagnosis not present

## 2022-12-16 DIAGNOSIS — R42 Dizziness and giddiness: Secondary | ICD-10-CM | POA: Diagnosis not present

## 2022-12-16 DIAGNOSIS — R5383 Other fatigue: Secondary | ICD-10-CM | POA: Diagnosis not present

## 2022-12-17 DIAGNOSIS — R42 Dizziness and giddiness: Secondary | ICD-10-CM | POA: Diagnosis not present

## 2022-12-18 DIAGNOSIS — R5381 Other malaise: Secondary | ICD-10-CM | POA: Diagnosis not present

## 2022-12-18 DIAGNOSIS — R2689 Other abnormalities of gait and mobility: Secondary | ICD-10-CM | POA: Diagnosis not present

## 2022-12-18 DIAGNOSIS — M6281 Muscle weakness (generalized): Secondary | ICD-10-CM | POA: Diagnosis not present

## 2022-12-25 DIAGNOSIS — M6281 Muscle weakness (generalized): Secondary | ICD-10-CM | POA: Diagnosis not present

## 2022-12-25 DIAGNOSIS — R2689 Other abnormalities of gait and mobility: Secondary | ICD-10-CM | POA: Diagnosis not present

## 2022-12-25 DIAGNOSIS — R5381 Other malaise: Secondary | ICD-10-CM | POA: Diagnosis not present

## 2022-12-28 DIAGNOSIS — K529 Noninfective gastroenteritis and colitis, unspecified: Secondary | ICD-10-CM | POA: Diagnosis not present

## 2022-12-29 DIAGNOSIS — E1122 Type 2 diabetes mellitus with diabetic chronic kidney disease: Secondary | ICD-10-CM | POA: Diagnosis not present

## 2022-12-29 DIAGNOSIS — Z961 Presence of intraocular lens: Secondary | ICD-10-CM | POA: Diagnosis not present

## 2022-12-29 DIAGNOSIS — M452 Ankylosing spondylitis of cervical region: Secondary | ICD-10-CM | POA: Diagnosis not present

## 2022-12-29 DIAGNOSIS — E785 Hyperlipidemia, unspecified: Secondary | ICD-10-CM | POA: Diagnosis not present

## 2022-12-29 DIAGNOSIS — Z79899 Other long term (current) drug therapy: Secondary | ICD-10-CM | POA: Diagnosis not present

## 2022-12-29 DIAGNOSIS — H44111 Panuveitis, right eye: Secondary | ICD-10-CM | POA: Diagnosis not present

## 2022-12-29 DIAGNOSIS — H35711 Central serous chorioretinopathy, right eye: Secondary | ICD-10-CM | POA: Diagnosis not present

## 2022-12-29 DIAGNOSIS — Z8673 Personal history of transient ischemic attack (TIA), and cerebral infarction without residual deficits: Secondary | ICD-10-CM | POA: Diagnosis not present

## 2022-12-31 DIAGNOSIS — K529 Noninfective gastroenteritis and colitis, unspecified: Secondary | ICD-10-CM | POA: Diagnosis not present

## 2023-01-01 DIAGNOSIS — R2689 Other abnormalities of gait and mobility: Secondary | ICD-10-CM | POA: Diagnosis not present

## 2023-01-01 DIAGNOSIS — M6281 Muscle weakness (generalized): Secondary | ICD-10-CM | POA: Diagnosis not present

## 2023-01-01 DIAGNOSIS — R5381 Other malaise: Secondary | ICD-10-CM | POA: Diagnosis not present

## 2023-01-01 DIAGNOSIS — K529 Noninfective gastroenteritis and colitis, unspecified: Secondary | ICD-10-CM | POA: Diagnosis not present

## 2023-01-06 DIAGNOSIS — H44111 Panuveitis, right eye: Secondary | ICD-10-CM | POA: Diagnosis not present

## 2023-01-06 DIAGNOSIS — Z79899 Other long term (current) drug therapy: Secondary | ICD-10-CM | POA: Diagnosis not present

## 2023-01-07 DIAGNOSIS — R2689 Other abnormalities of gait and mobility: Secondary | ICD-10-CM | POA: Diagnosis not present

## 2023-01-07 DIAGNOSIS — R5381 Other malaise: Secondary | ICD-10-CM | POA: Diagnosis not present

## 2023-01-07 DIAGNOSIS — M6281 Muscle weakness (generalized): Secondary | ICD-10-CM | POA: Diagnosis not present

## 2023-01-08 ENCOUNTER — Telehealth: Payer: Self-pay | Admitting: Neurology

## 2023-01-08 ENCOUNTER — Ambulatory Visit (INDEPENDENT_AMBULATORY_CARE_PROVIDER_SITE_OTHER): Payer: Medicare Other | Admitting: Neurology

## 2023-01-08 ENCOUNTER — Encounter: Payer: Self-pay | Admitting: Neurology

## 2023-01-08 VITALS — BP 147/76 | HR 98

## 2023-01-08 DIAGNOSIS — R42 Dizziness and giddiness: Secondary | ICD-10-CM

## 2023-01-08 DIAGNOSIS — M452 Ankylosing spondylitis of cervical region: Secondary | ICD-10-CM

## 2023-01-08 NOTE — Progress Notes (Signed)
[facility]  PATIENT: Steven [name] DOB: 1935/02/04  REQUESTING CLINICIAN: [name], MD HISTORY FROM: Patient  REASON FOR VISIT: Dizziness    HISTORICAL  CHIEF COMPLAINT:  Chief Complaint  Patient presents with   New Patient (Initial Visit)    Patient in room #13 and alone. Patient states he been feeling dizzy a lot lately. Sitting 147/76 HR: 98/ Standing BP: 131/80 HR:87.    HISTORY OF PRESENT ILLNESS:  This is a 88 year old gentleman with past medical history including hyperlipidemia, ankylosing spondylosis, depression who is presenting with dizziness for the past 6 months.  Dizziness described as lightheadedness that is constant, worse sometimes when walking.  He feels when he walks he will lose his balance or lean to the right side.  He reports 50-months ago, he fell but this was in the setting of getting out of bed to use the bathroom.  He felt like someone pulling him on the right side.  He is currently doing physical therapy, reports some minimal improvement.  He was told by the PT therapist that his right hip is weaker than the left and they are working on it to improve the weakness.  In terms of the dizziness, he denies any room spinning sensation, denies any worsening hearing loss or tinnitus and describes the lightheaded like swimmy headed.  This has been going on for the past 6 months and constant.   OTHER MEDICAL CONDITIONS: Hyperlipidemia, Ankylosis spondylosis, Depression    REVIEW OF SYSTEMS: Full 14 system review of systems performed and negative with exception of: As noted in the HPI   ALLERGIES: Allergies  Allergen Reactions   Indomethacin Hives and Other (See Comments)   Lactose Intolerance (Gi)     GI UPSET    HOME MEDICATIONS: Outpatient Medications Prior to Visit  Medication Sig Dispense Refill   aspirin EC 325 MG EC tablet Take 1 tablet (325 mg total) by mouth daily. 30 tablet 1   Biotin 5000 MCG TABS Take 1 tablet by mouth  daily.     buPROPion (WELLBUTRIN XL) 150 MG 24 hr tablet Take 150 mg by mouth daily.     Calcium Citrate 250 MG TABS Take 1 tablet by mouth daily.     cholecalciferol (VITAMIN D) 1000 units tablet Take 1,000 Units by mouth daily.     citalopram (CELEXA) 20 MG tablet Take 20 mg by mouth daily.      clopidogrel (PLAVIX) 75 MG tablet Take 1 tablet (75 mg total) by mouth daily. 30 tablet 1   folic acid (FOLVITE) 1 MG tablet Take 1 mg by mouth in the morning, at noon, and at bedtime.     GEMTESA 75 MG TABS Take 1 tablet by mouth daily.     ipratropium (ATROVENT) 0.06 % nasal spray Place 2 sprays into both nostrils 2 (two) times daily as needed (allergies).     methotrexate (RHEUMATREX) 2.5 MG tablet Take 20 mg by mouth once a week. TAKE 8 TABLETS (20 MG) BY MOUTH ONCE A WEEK.     Multiple Vitamins-Minerals (PRESERVISION AREDS 2 PO) Take 1 tablet by mouth in the morning and at bedtime.     pantoprazole (PROTONIX) 40 MG tablet Take 1 tablet (40 mg total) by mouth daily. 30 tablet 1   rosuvastatin (CRESTOR) 20 MG tablet Take 1 tablet (20 mg total) by mouth at bedtime. 30 tablet 1   ferrous sulfate 325 (65 FE) MG tablet Take 1 tablet (325 mg total) by mouth daily with  breakfast. 30 tablet 0   Wheat Dextrin (BENEFIBER) POWD Take 1 Scoop by mouth daily. (Patient not taking: Reported on 01/08/2023)     No facility-administered medications prior to visit.    PAST MEDICAL HISTORY: Past Medical History:  Diagnosis Date   Allergy    enviromental   Anemia 2017   Anxiety    Arthritis    ankylosing spodilitis   Blood transfusion without reported diagnosis    during surgery   Cataract    Depression    Dyspnea    with exertion - had Echo done 09/29/16   History of kidney stones    Hyperlipidemia    Intestinal obstruction (HCC)    Pneumonia    as a child    PAST SURGICAL HISTORY: Past Surgical History:  Procedure Laterality Date   ABDOMINAL SURGERY     had abcess   APPENDECTOMY      CHOLECYSTECTOMY     with lysis of adhesions   COLONOSCOPY     COLOSTOMY     COLOSTOMY REVERSAL     EYE SURGERY Left    scar tissue removed from cornea   EYE SURGERY Right    cataract surgery with lens implant   JOINT REPLACEMENT Right    hip  x 2 1999 and 2007   SHOULDER ARTHROSCOPY WITH ROTATOR CUFF REPAIR AND SUBACROMIAL DECOMPRESSION Left 03/02/2013   Procedure: LEFT SHOULDER ARTHROSCOPY WITH SUBACROMIAL DECOMPRESSION DISTAL CALVICLE RESECTION AND ROTATOR CUFF REPAIR ;  Surgeon: Senaida Lange, MD;  Location: [facility] OR;  Service: Orthopedics;  Laterality: Left;   TOTAL HIP REVISION Right 11/06/2016   Procedure: TOTAL HIP REVISION OF THE ACETABULAR COMPONENT;  Surgeon: Gean Birchwood, MD;  Location: [facility] OR;  Service: Orthopedics;  Laterality: Right;   TRANSCAROTID ARTERY REVASCULARIZATION  Left 03/31/2021   Procedure: TRANSCAROTID ARTERY REVASCULARIZATION;  Surgeon: Victorino Sparrow, MD;  Location: [facility] OR;  Service: Vascular;  Laterality: Left;   ULTRASOUND GUIDANCE FOR VASCULAR ACCESS Right 03/31/2021   Procedure: ULTRASOUND GUIDANCE FOR VASCULAR ACCESS;  Surgeon: Victorino Sparrow, MD;  Location[facility] OR;  Service: Vascular;  Laterality: Right;    FAMILY HISTORY: Family History  Problem Relation Age of Onset   Heart attack Mother    Diabetes Mother    Breast cancer Mother    Heart attack Maternal Grandfather    Pancreatic cancer Brother    Colon cancer Neg Hx    Esophageal cancer Neg Hx    Stomach cancer Neg Hx     SOCIAL HISTORY: Social History   Socioeconomic History   Marital status: Married    Spouse name: Not on file   Number of children: 2   Years of education: Not on file   Highest education level: Not on file  Occupational History   Occupation: retired  Tobacco Use   Smoking status: Former    Current packs/day: 0.00    Average packs/day: 1 pack/day for 9.0 years (9.0 ttl pk-yrs)    Types: Cigarettes    Start date: 04/13/1956    Quit date: 04/13/1965    Years since  quitting: 57.7    Passive exposure: Never   Smokeless tobacco: Never  Vaping Use   Vaping status: Never Used  Substance and Sexual Activity   Alcohol use: Yes    Comment: 3 beers or glasses of wine a day--reports stopped ETOH 11/01/16    Drug use: No   Sexual activity: Never  Other Topics Concern   Not on file  Social  History Narrative   Not on file   Social Determinants of Health   Financial Resource Strain: Low Risk  (03/09/2019)   Overall Financial Resource Strain (CARDIA)    Difficulty of Paying Living Expenses: Not hard at all  Food Insecurity: No Food Insecurity (07/11/2022)   Hunger Vital Sign    Worried About Running Out of Food in the Last Year: Never true    Ran Out of Food in the Last Year: Never true  Transportation Needs: No Transportation Needs (07/11/2022)   PRAPARE - Administrator, Civil Service (Medical): No    Lack of Transportation (Non-Medical): No  Physical Activity: Insufficiently Active (03/09/2019)   Exercise Vital Sign    Days of Exercise per Week: 5 days    Minutes of Exercise per Session: 10 min  Stress: No Stress Concern Present (03/09/2019)   [employer] of Occupational Health - Occupational Stress Questionnaire    Feeling of Stress : Not at all  Social Connections: Unknown (03/09/2019)   Social Connection and Isolation Panel [NHANES]    Frequency of Communication with Friends and Family: Patient declined    Frequency of Social Gatherings with Friends and Family: Patient declined    Attends Religious Services: Patient declined    Database administrator or Organizations: Patient declined    Attends Banker Meetings: Patient declined    Marital Status: Patient declined  Intimate Partner Violence: Not At Risk (07/11/2022)   Humiliation, Afraid, Rape, and Kick questionnaire    Fear of Current or Ex-Partner: No    Emotionally Abused: No    Physically Abused: No    Sexually Abused: No    PHYSICAL EXAM  GENERAL  EXAM/CONSTITUTIONAL: Vitals:  Vitals:   01/08/23 1053  BP: (!) 147/76  Pulse: 98   There is no height or weight on file to calculate BMI. Wt Readings from Last 3 Encounters:  09/10/22 127 lb (57.6 kg)  08/07/22 128 lb 6.4 oz (58.2 kg)  07/30/22 128 lb (58.1 kg)   Orthostatic VS for the past 24 hrs:  BP- Sitting Pulse- Sitting BP- Standing at 0 minutes Pulse- Standing at 0 minutes  01/08/23 1056 147/76 98 131/80 87    Patient is in no distress; well developed, nourished and groomed; neck is supple  MUSCULOSKELETAL: Gait, strength, tone, movements noted in Neurologic exam below  NEUROLOGIC: MENTAL STATUS:      No data to display         awake, alert, oriented to person, place and time recent and remote memory intact normal attention and concentration language fluent, comprehension intact, naming intact fund of knowledge appropriate  CRANIAL NERVE:  2nd, 3rd, 4th, 6th - Visual fields full to confrontation, extraocular muscles intact, no nystagmus 5th - facial sensation symmetric 7th - facial strength symmetric 8th - hearing intact 9th - palate elevates symmetrically, uvula midline 11th - shoulder shrug symmetric 12th - tongue protrusion midline  MOTOR:  normal bulk and tone, full strength in the BUE, BLE  SENSORY:  normal and symmetric to light touch  COORDINATION:  finger-nose-finger, fine finger movements normal  REFLEXES:  deep tendon reflexes present and symmetric  GAIT/STATION:  normal   DIAGNOSTIC DATA (LABS, IMAGING, TESTING) - I reviewed patient records, labs, notes, testing and imaging myself where available.  Lab Results  Component Value Date   WBC 6.8 08/07/2022   HGB 12.0 (L) 08/07/2022   HCT 36.5 (L) 08/07/2022   MCV 97.9 08/07/2022   PLT 314 08/07/2022  Component Value Date/Time   NA 140 08/07/2022 0844   NA 141 04/20/2012 0425   K 4.2 08/07/2022 0844   K 4.1 04/20/2012 0425   CL 103 08/07/2022 0844   CL 109 (H) 04/20/2012  0425   CO2 33 (H) 08/07/2022 0844   CO2 28 04/20/2012 0425   GLUCOSE 128 (H) 08/07/2022 0844   GLUCOSE 144 (H) 04/20/2012 0425   BUN 13 08/07/2022 0844   BUN 14 04/20/2012 0425   CREATININE 0.74 08/07/2022 0844   CREATININE 0.95 04/20/2012 0425   CALCIUM 9.5 08/07/2022 0844   CALCIUM 8.2 (L) 04/20/2012 0425   PROT 7.2 08/07/2022 0844   PROT 7.0 04/20/2012 0425   ALBUMIN 3.7 08/07/2022 0844   ALBUMIN 3.5 04/20/2012 0425   AST 16 08/07/2022 0844   ALT 13 08/07/2022 0844   ALT 41 04/20/2012 0425   ALKPHOS 61 08/07/2022 0844   ALKPHOS 40 (L) 04/20/2012 0425   BILITOT 0.6 08/07/2022 0844   GFRNONAA >60 08/07/2022 0844   GFRNONAA >60 04/20/2012 0425   GFRAA >60 11/16/2019 1407   GFRAA >60 04/20/2012 0425   Lab Results  Component Value Date   CHOL 89 04/01/2021   HDL 34 (L) 04/01/2021   LDLCALC 38 04/01/2021   TRIG 87 04/01/2021   CHOLHDL 2.6 04/01/2021   Lab Results  Component Value Date   HGBA1C 6.4 (H) 03/28/2021   Lab Results  Component Value Date   VITAMINB12 117 (L) 06/06/2021   Lab Results  Component Value Date   TSH 1.417 06/03/2021    CT head 09/01/2021 1. No acute intracranial pathology. 2. Mild age-related atrophy and chronic microvascular ischemic changes   ASSESSMENT AND PLAN  88 y.o. year old male with history including hyperlipidemia, ankylosing spondylosis, depression who is presenting with dizziness for the past 6 months, described as constant lightheadedness, feeling swimmy headed.  He is doing physical therapy, reports minimal improvement.  Plan for patient is to obtain MRI brain to rule out intracranial abnormality that can explain his symptoms.  If MRI is negative, then he will continue with physical therapy and I also advised him to increase his fluid intake, drink at least 64 ounces of water daily.  He voiced understanding.  Continue to follow with PCP and return as needed.   1. Dizziness   2. Ankylosing spondylitis of cervical region [facility])       Patient Instructions  Continue with physical therapy Increase fluid intake, water intake MRI brain without contrast, I will contact you to go over the result Continue follow-up PCP Return as needed.  Orders Placed This Encounter  Procedures   MR BRAIN WO CONTRAST    No orders of the defined types were placed in this encounter.   Return if symptoms worsen or fail to improve.    Windell Norfolk, MD 01/08/2023, 12:14 PM  [facility] [address] [place], [place] [address] [phone]

## 2023-01-08 NOTE — Telephone Encounter (Signed)
[employer] medicare NPR sent to GI [phone] 

## 2023-01-08 NOTE — Patient Instructions (Signed)
Continue with physical therapy Increase fluid intake, water intake MRI brain without contrast, I will contact you to go over the result Continue follow-up PCP Return as needed.

## 2023-01-11 DIAGNOSIS — D509 Iron deficiency anemia, unspecified: Secondary | ICD-10-CM | POA: Diagnosis not present

## 2023-01-11 DIAGNOSIS — E1122 Type 2 diabetes mellitus with diabetic chronic kidney disease: Secondary | ICD-10-CM | POA: Diagnosis not present

## 2023-01-11 DIAGNOSIS — Z8673 Personal history of transient ischemic attack (TIA), and cerebral infarction without residual deficits: Secondary | ICD-10-CM | POA: Diagnosis not present

## 2023-01-11 DIAGNOSIS — E785 Hyperlipidemia, unspecified: Secondary | ICD-10-CM | POA: Diagnosis not present

## 2023-01-11 DIAGNOSIS — G4700 Insomnia, unspecified: Secondary | ICD-10-CM | POA: Diagnosis not present

## 2023-01-14 DIAGNOSIS — Z85828 Personal history of other malignant neoplasm of skin: Secondary | ICD-10-CM | POA: Diagnosis not present

## 2023-01-14 DIAGNOSIS — L570 Actinic keratosis: Secondary | ICD-10-CM | POA: Diagnosis not present

## 2023-01-14 DIAGNOSIS — L578 Other skin changes due to chronic exposure to nonionizing radiation: Secondary | ICD-10-CM | POA: Diagnosis not present

## 2023-01-14 DIAGNOSIS — M948X9 Other specified disorders of cartilage, unspecified sites: Secondary | ICD-10-CM | POA: Diagnosis not present

## 2023-01-14 DIAGNOSIS — L821 Other seborrheic keratosis: Secondary | ICD-10-CM | POA: Diagnosis not present

## 2023-01-15 DIAGNOSIS — M6281 Muscle weakness (generalized): Secondary | ICD-10-CM | POA: Diagnosis not present

## 2023-01-15 DIAGNOSIS — R2689 Other abnormalities of gait and mobility: Secondary | ICD-10-CM | POA: Diagnosis not present

## 2023-01-15 DIAGNOSIS — R5381 Other malaise: Secondary | ICD-10-CM | POA: Diagnosis not present

## 2023-01-20 ENCOUNTER — Telehealth: Payer: Self-pay | Admitting: Neurology

## 2023-01-20 NOTE — Telephone Encounter (Signed)
Wife is asking if in an attempt to have pt MRI done earlier , she'd like to know if she can take pt to [facility], please call.

## 2023-01-21 NOTE — Telephone Encounter (Signed)
Patients wife called in - stated that [facility] has not received order. Can it please be resent to [phone] - they also will need to know if patient needs any anesthesia for this procedure?   Patients wife is asking for a call back once this is sent

## 2023-01-21 NOTE — Telephone Encounter (Signed)
I called her back and let her know that I resent the order to the fax number she provided and I sent a message to [facility] asking them to see if they can move up his appointment. She is agreeable to take him wherever they can get the soonest appointment.

## 2023-01-21 NOTE — Telephone Encounter (Signed)
MRI order has been sent to [facility].

## 2023-01-25 DIAGNOSIS — R031 Nonspecific low blood-pressure reading: Secondary | ICD-10-CM | POA: Diagnosis not present

## 2023-01-25 DIAGNOSIS — R634 Abnormal weight loss: Secondary | ICD-10-CM | POA: Diagnosis not present

## 2023-01-26 ENCOUNTER — Ambulatory Visit
Admission: RE | Admit: 2023-01-26 | Discharge: 2023-01-26 | Disposition: A | Discharge status: Home / Self Care | Payer: Medicare Other | Source: Ambulatory Visit | Attending: Neurology | Admitting: Neurology

## 2023-01-26 ENCOUNTER — Encounter: Payer: Self-pay | Admitting: Hematology and Oncology

## 2023-01-26 DIAGNOSIS — R42 Dizziness and giddiness: Secondary | ICD-10-CM | POA: Diagnosis not present

## 2023-01-29 DIAGNOSIS — R5381 Other malaise: Secondary | ICD-10-CM | POA: Diagnosis not present

## 2023-01-29 DIAGNOSIS — M6281 Muscle weakness (generalized): Secondary | ICD-10-CM | POA: Diagnosis not present

## 2023-01-29 DIAGNOSIS — R2689 Other abnormalities of gait and mobility: Secondary | ICD-10-CM | POA: Diagnosis not present

## 2023-02-03 DIAGNOSIS — K529 Noninfective gastroenteritis and colitis, unspecified: Secondary | ICD-10-CM | POA: Diagnosis not present

## 2023-02-04 ENCOUNTER — Inpatient Hospital Stay (HOSPITAL_COMMUNITY)
Admission: EM | Admit: 2023-02-04 | Discharge: 2023-02-06 | DRG: 871 | Disposition: A | Discharge status: Home Health | Payer: Medicare Other | Attending: Family Medicine | Admitting: Family Medicine

## 2023-02-04 ENCOUNTER — Emergency Department (HOSPITAL_COMMUNITY): Payer: Medicare Other

## 2023-02-04 ENCOUNTER — Encounter (HOSPITAL_COMMUNITY): Payer: Self-pay

## 2023-02-04 ENCOUNTER — Other Ambulatory Visit: Payer: Self-pay

## 2023-02-04 DIAGNOSIS — R296 Repeated falls: Secondary | ICD-10-CM | POA: Diagnosis present

## 2023-02-04 DIAGNOSIS — Z743 Need for continuous supervision: Secondary | ICD-10-CM | POA: Diagnosis not present

## 2023-02-04 DIAGNOSIS — R918 Other nonspecific abnormal finding of lung field: Secondary | ICD-10-CM | POA: Diagnosis not present

## 2023-02-04 DIAGNOSIS — Z7902 Long term (current) use of antithrombotics/antiplatelets: Secondary | ICD-10-CM

## 2023-02-04 DIAGNOSIS — Z91011 Allergy to milk products: Secondary | ICD-10-CM | POA: Diagnosis not present

## 2023-02-04 DIAGNOSIS — Z9049 Acquired absence of other specified parts of digestive tract: Secondary | ICD-10-CM

## 2023-02-04 DIAGNOSIS — E876 Hypokalemia: Secondary | ICD-10-CM | POA: Diagnosis present

## 2023-02-04 DIAGNOSIS — Z87891 Personal history of nicotine dependence: Secondary | ICD-10-CM | POA: Diagnosis not present

## 2023-02-04 DIAGNOSIS — W19XXXA Unspecified fall, initial encounter: Secondary | ICD-10-CM | POA: Diagnosis present

## 2023-02-04 DIAGNOSIS — J984 Other disorders of lung: Secondary | ICD-10-CM | POA: Diagnosis not present

## 2023-02-04 DIAGNOSIS — Z96641 Presence of right artificial hip joint: Secondary | ICD-10-CM | POA: Diagnosis present

## 2023-02-04 DIAGNOSIS — Z833 Family history of diabetes mellitus: Secondary | ICD-10-CM | POA: Diagnosis not present

## 2023-02-04 DIAGNOSIS — Z800 Family history of malignant neoplasm of digestive organs: Secondary | ICD-10-CM

## 2023-02-04 DIAGNOSIS — Z8249 Family history of ischemic heart disease and other diseases of the circulatory system: Secondary | ICD-10-CM | POA: Diagnosis not present

## 2023-02-04 DIAGNOSIS — Z87442 Personal history of urinary calculi: Secondary | ICD-10-CM | POA: Diagnosis not present

## 2023-02-04 DIAGNOSIS — E785 Hyperlipidemia, unspecified: Secondary | ICD-10-CM

## 2023-02-04 DIAGNOSIS — D638 Anemia in other chronic diseases classified elsewhere: Secondary | ICD-10-CM | POA: Diagnosis present

## 2023-02-04 DIAGNOSIS — Z961 Presence of intraocular lens: Secondary | ICD-10-CM | POA: Diagnosis not present

## 2023-02-04 DIAGNOSIS — R6889 Other general symptoms and signs: Secondary | ICD-10-CM | POA: Diagnosis not present

## 2023-02-04 DIAGNOSIS — Z1152 Encounter for screening for COVID-19: Secondary | ICD-10-CM

## 2023-02-04 DIAGNOSIS — Z803 Family history of malignant neoplasm of breast: Secondary | ICD-10-CM | POA: Diagnosis not present

## 2023-02-04 DIAGNOSIS — Z888 Allergy status to other drugs, medicaments and biological substances status: Secondary | ICD-10-CM

## 2023-02-04 DIAGNOSIS — R509 Fever, unspecified: Secondary | ICD-10-CM | POA: Diagnosis not present

## 2023-02-04 DIAGNOSIS — Z7982 Long term (current) use of aspirin: Secondary | ICD-10-CM

## 2023-02-04 DIAGNOSIS — M457 Ankylosing spondylitis of lumbosacral region: Secondary | ICD-10-CM | POA: Diagnosis not present

## 2023-02-04 DIAGNOSIS — Z91018 Allergy to other foods: Secondary | ICD-10-CM

## 2023-02-04 DIAGNOSIS — F32A Depression, unspecified: Secondary | ICD-10-CM | POA: Diagnosis present

## 2023-02-04 DIAGNOSIS — I951 Orthostatic hypotension: Secondary | ICD-10-CM | POA: Diagnosis not present

## 2023-02-04 DIAGNOSIS — Z8673 Personal history of transient ischemic attack (TIA), and cerebral infarction without residual deficits: Secondary | ICD-10-CM | POA: Diagnosis not present

## 2023-02-04 DIAGNOSIS — A419 Sepsis, unspecified organism: Secondary | ICD-10-CM | POA: Diagnosis not present

## 2023-02-04 DIAGNOSIS — J189 Pneumonia, unspecified organism: Secondary | ICD-10-CM | POA: Diagnosis present

## 2023-02-04 DIAGNOSIS — I499 Cardiac arrhythmia, unspecified: Secondary | ICD-10-CM | POA: Diagnosis not present

## 2023-02-04 DIAGNOSIS — K219 Gastro-esophageal reflux disease without esophagitis: Secondary | ICD-10-CM | POA: Insufficient documentation

## 2023-02-04 DIAGNOSIS — R059 Cough, unspecified: Secondary | ICD-10-CM | POA: Diagnosis not present

## 2023-02-04 DIAGNOSIS — R531 Weakness: Secondary | ICD-10-CM | POA: Diagnosis not present

## 2023-02-04 DIAGNOSIS — F329 Major depressive disorder, single episode, unspecified: Secondary | ICD-10-CM | POA: Diagnosis present

## 2023-02-04 DIAGNOSIS — Z9849 Cataract extraction status, unspecified eye: Secondary | ICD-10-CM

## 2023-02-04 DIAGNOSIS — R0689 Other abnormalities of breathing: Secondary | ICD-10-CM | POA: Diagnosis not present

## 2023-02-04 DIAGNOSIS — M459 Ankylosing spondylitis of unspecified sites in spine: Secondary | ICD-10-CM | POA: Diagnosis present

## 2023-02-04 LAB — CBC WITH DIFFERENTIAL/PLATELET
Abs Immature Granulocytes: 0.08 10*3/uL — ABNORMAL HIGH (ref 0.00–0.07)
Basophils Absolute: 0 10*3/uL (ref 0.0–0.1)
Basophils Relative: 0 %
Eosinophils Absolute: 0.1 10*3/uL (ref 0.0–0.5)
Eosinophils Relative: 0 %
HCT: 36.5 % — ABNORMAL LOW (ref 39.0–52.0)
Hemoglobin: 12.1 g/dL — ABNORMAL LOW (ref 13.0–17.0)
Immature Granulocytes: 1 %
Lymphocytes Relative: 2 %
Lymphs Abs: 0.3 10*3/uL — ABNORMAL LOW (ref 0.7–4.0)
MCH: 32.7 pg (ref 26.0–34.0)
MCHC: 33.2 g/dL (ref 30.0–36.0)
MCV: 98.6 fL (ref 80.0–100.0)
Monocytes Absolute: 1 10*3/uL (ref 0.1–1.0)
Monocytes Relative: 6 %
Neutro Abs: 13.9 10*3/uL — ABNORMAL HIGH (ref 1.7–7.7)
Neutrophils Relative %: 91 %
Platelets: 295 10*3/uL (ref 150–400)
RBC: 3.7 MIL/uL — ABNORMAL LOW (ref 4.22–5.81)
RDW: 13.3 % (ref 11.5–15.5)
WBC: 15.4 10*3/uL — ABNORMAL HIGH (ref 4.0–10.5)
nRBC: 0 % (ref 0.0–0.2)

## 2023-02-04 LAB — URINALYSIS, W/ REFLEX TO CULTURE (INFECTION SUSPECTED)
Bacteria, UA: NONE SEEN
Bilirubin Urine: NEGATIVE
Glucose, UA: NEGATIVE mg/dL
Hgb urine dipstick: NEGATIVE
Ketones, ur: NEGATIVE mg/dL
Leukocytes,Ua: NEGATIVE
Nitrite: NEGATIVE
Protein, ur: NEGATIVE mg/dL
Specific Gravity, Urine: 1.013 (ref 1.005–1.030)
pH: 7 (ref 5.0–8.0)

## 2023-02-04 LAB — RESP PANEL BY RT-PCR (RSV, FLU A&B, COVID)  RVPGX2
Influenza A by PCR: NEGATIVE
Influenza B by PCR: NEGATIVE
Resp Syncytial Virus by PCR: NEGATIVE
SARS Coronavirus 2 by RT PCR: NEGATIVE

## 2023-02-04 LAB — I-STAT CG4 LACTIC ACID, ED
Lactic Acid, Venous: 2.4 mmol/L (ref 0.5–1.9)
Lactic Acid, Venous: 2.5 mmol/L (ref 0.5–1.9)

## 2023-02-04 LAB — COMPREHENSIVE METABOLIC PANEL
ALT: 12 U/L (ref 0–44)
AST: 15 U/L (ref 15–41)
Albumin: 3.2 g/dL — ABNORMAL LOW (ref 3.5–5.0)
Alkaline Phosphatase: 68 U/L (ref 38–126)
Anion gap: 9 (ref 5–15)
BUN: 16 mg/dL (ref 8–23)
CO2: 24 mmol/L (ref 22–32)
Calcium: 8.1 mg/dL — ABNORMAL LOW (ref 8.9–10.3)
Chloride: 104 mmol/L (ref 98–111)
Creatinine, Ser: 0.71 mg/dL (ref 0.61–1.24)
GFR, Estimated: >60 mL/min (ref >60)
Glucose, Bld: 136 mg/dL — ABNORMAL HIGH (ref 70–99)
Potassium: 3.5 mmol/L (ref 3.5–5.1)
Sodium: 137 mmol/L (ref 135–145)
Total Bilirubin: 0.7 mg/dL (ref 0.3–1.2)
Total Protein: 7.1 g/dL (ref 6.5–8.1)

## 2023-02-04 LAB — LIPASE, BLOOD: Lipase: 24 U/L (ref 11–51)

## 2023-02-04 MED ORDER — CALCIUM CARBONATE 1250 (500 CA) MG PO TABS
0.5000 | ORAL_TABLET | Freq: Every day | ORAL | Status: DC
  Administered 2023-02-05 – 2023-02-06 (×2): 625 mg via ORAL
  Filled 2023-02-04 (×2): qty 1

## 2023-02-04 MED ORDER — CITALOPRAM HYDROBROMIDE 10 MG PO TABS: 20.0000 mg | ORAL_TABLET | Freq: Every day | ORAL | Status: DC

## 2023-02-04 MED ORDER — ONDANSETRON HCL 4 MG PO TABS: 4.0000 mg | ORAL_TABLET | Freq: Four times a day (QID) | ORAL | Status: DC | PRN

## 2023-02-04 MED ORDER — ACETAMINOPHEN 325 MG PO TABS: 650.0000 mg | ORAL_TABLET | Freq: Four times a day (QID) | ORAL | Status: DC | PRN

## 2023-02-04 MED ORDER — LACTATED RINGERS IV BOLUS
1000.0000 mL | Freq: Once | INTRAVENOUS | Status: AC
  Administered 2023-02-04: 1000 mL via INTRAVENOUS

## 2023-02-04 MED ORDER — VITAMIN D 25 MCG (1000 UNIT) PO TABS
1000.0000 [IU] | ORAL_TABLET | Freq: Every day | ORAL | Status: DC
  Administered 2023-02-05 – 2023-02-06 (×2): 1000 [IU] via ORAL
  Filled 2023-02-04 (×2): qty 1

## 2023-02-04 MED ORDER — ONDANSETRON HCL 4 MG/2ML IJ SOLN: 4.0000 mg | Freq: Four times a day (QID) | INTRAMUSCULAR | Status: DC | PRN

## 2023-02-04 MED ORDER — FERROUS SULFATE 325 (65 FE) MG PO TABS
325.0000 mg | ORAL_TABLET | Freq: Every day | ORAL | Status: DC
  Filled 2023-02-04 (×2): qty 1

## 2023-02-04 MED ORDER — ASPIRIN 325 MG PO TBEC
325.0000 mg | DELAYED_RELEASE_TABLET | Freq: Every day | ORAL | Status: DC
  Administered 2023-02-05 – 2023-02-06 (×2): 325 mg via ORAL
  Filled 2023-02-04 (×2): qty 1

## 2023-02-04 MED ORDER — CLOPIDOGREL BISULFATE 75 MG PO TABS
75.0000 mg | ORAL_TABLET | Freq: Every day | ORAL | Status: DC
  Administered 2023-02-04 – 2023-02-06 (×3): 75 mg via ORAL
  Filled 2023-02-04 (×3): qty 1

## 2023-02-04 MED ORDER — AZITHROMYCIN 250 MG PO TABS
500.0000 mg | ORAL_TABLET | Freq: Once | ORAL | Status: DC
Start: 2023-02-04 — End: 2023-02-04
  Filled 2023-02-04: qty 2

## 2023-02-04 MED ORDER — HYDROCOD POLI-CHLORPHE POLI ER 10-8 MG/5ML PO SUER: 5.0000 mL | Freq: Two times a day (BID) | ORAL | Status: DC | PRN

## 2023-02-04 MED ORDER — MIRABEGRON ER 25 MG PO TB24
25.0000 mg | ORAL_TABLET | Freq: Every day | ORAL | Status: DC
  Administered 2023-02-04 – 2023-02-06 (×3): 25 mg via ORAL
  Filled 2023-02-04 (×3): qty 1

## 2023-02-04 MED ORDER — TRAZODONE HCL 50 MG PO TABS
25.0000 mg | ORAL_TABLET | Freq: Every evening | ORAL | Status: DC | PRN
  Administered 2023-02-04: 25 mg via ORAL
  Filled 2023-02-04: qty 1

## 2023-02-04 MED ORDER — SODIUM CHLORIDE 0.9 % IV SOLN
1.0000 g | Freq: Once | INTRAVENOUS | Status: DC
  Filled 2023-02-04: qty 10

## 2023-02-04 MED ORDER — PROSIGHT PO TABS
1.0000 | ORAL_TABLET | Freq: Every day | ORAL | Status: DC
  Administered 2023-02-05 – 2023-02-06 (×2): 1 via ORAL
  Filled 2023-02-04 (×2): qty 1

## 2023-02-04 MED ORDER — ENOXAPARIN SODIUM 40 MG/0.4ML IJ SOSY
40.0000 mg | PREFILLED_SYRINGE | INTRAMUSCULAR | Status: DC
Start: 2023-02-04 — End: 2023-02-06
  Administered 2023-02-04 – 2023-02-05 (×2): 40 mg via SUBCUTANEOUS
  Filled 2023-02-04 (×2): qty 0.4

## 2023-02-04 MED ORDER — METHOTREXATE 2.5 MG PO TABS: 20.0000 mg | ORAL_TABLET | ORAL | Status: DC

## 2023-02-04 MED ORDER — BIOTIN 5000 MCG PO TABS: 1.0000 | ORAL_TABLET | Freq: Every day | ORAL | Status: DC

## 2023-02-04 MED ORDER — ROSUVASTATIN CALCIUM 20 MG PO TABS
20.0000 mg | ORAL_TABLET | Freq: Every day | ORAL | Status: DC
  Administered 2023-02-04 – 2023-02-05 (×2): 20 mg via ORAL
  Filled 2023-02-04 (×2): qty 1

## 2023-02-04 MED ORDER — BENEFIBER PO POWD: 1.0000 | Freq: Every day | ORAL | Status: DC

## 2023-02-04 MED ORDER — IPRATROPIUM-ALBUTEROL 0.5-2.5 (3) MG/3ML IN SOLN
3.0000 mL | Freq: Four times a day (QID) | RESPIRATORY_TRACT | Status: DC
  Administered 2023-02-04 – 2023-02-06 (×7): 3 mL via RESPIRATORY_TRACT
  Filled 2023-02-04 (×7): qty 3

## 2023-02-04 MED ORDER — SODIUM CHLORIDE 0.9 % IV SOLN
500.0000 mg | INTRAVENOUS | Status: DC
  Administered 2023-02-04 – 2023-02-05 (×2): 500 mg via INTRAVENOUS
  Filled 2023-02-04 (×3): qty 5

## 2023-02-04 MED ORDER — ACETAMINOPHEN 650 MG RE SUPP: 650.0000 mg | Freq: Four times a day (QID) | RECTAL | Status: DC | PRN

## 2023-02-04 MED ORDER — GUAIFENESIN ER 600 MG PO TB12
600.0000 mg | ORAL_TABLET | Freq: Two times a day (BID) | ORAL | Status: DC
  Administered 2023-02-04 – 2023-02-06 (×4): 600 mg via ORAL
  Filled 2023-02-04 (×4): qty 1

## 2023-02-04 MED ORDER — PANTOPRAZOLE SODIUM 40 MG PO TBEC
40.0000 mg | DELAYED_RELEASE_TABLET | Freq: Every day | ORAL | Status: DC
  Administered 2023-02-05 – 2023-02-06 (×2): 40 mg via ORAL
  Filled 2023-02-04 (×2): qty 1

## 2023-02-04 MED ORDER — LACTATED RINGERS IV SOLN
150.0000 mL/h | INTRAVENOUS | Status: DC
  Administered 2023-02-04 – 2023-02-05 (×3): 150 mL/h via INTRAVENOUS

## 2023-02-04 MED ORDER — BUPROPION HCL ER (XL) 150 MG PO TB24
300.0000 mg | ORAL_TABLET | Freq: Every day | ORAL | Status: DC
  Administered 2023-02-05 – 2023-02-06 (×2): 300 mg via ORAL
  Filled 2023-02-04 (×2): qty 2

## 2023-02-04 MED ORDER — IPRATROPIUM BROMIDE 0.06 % NA SOLN: 2.0000 | Freq: Two times a day (BID) | NASAL | Status: DC | PRN

## 2023-02-04 MED ORDER — MAGNESIUM HYDROXIDE 400 MG/5ML PO SUSP: 30.0000 mL | Freq: Every day | ORAL | Status: DC | PRN

## 2023-02-04 MED ORDER — FOLIC ACID 1 MG PO TABS
1.0000 mg | ORAL_TABLET | Freq: Every day | ORAL | Status: DC
  Administered 2023-02-05 – 2023-02-06 (×2): 1 mg via ORAL
  Filled 2023-02-04 (×2): qty 1

## 2023-02-04 MED ORDER — ACETAMINOPHEN 325 MG PO TABS
650.0000 mg | ORAL_TABLET | Freq: Once | ORAL | Status: AC
  Administered 2023-02-04: 650 mg via ORAL
  Filled 2023-02-04: qty 2

## 2023-02-04 MED ORDER — SODIUM CHLORIDE 0.9 % IV SOLN
2.0000 g | INTRAVENOUS | Status: DC
  Administered 2023-02-04 – 2023-02-05 (×2): 2 g via INTRAVENOUS
  Filled 2023-02-04 (×2): qty 20

## 2023-02-04 NOTE — Assessment & Plan Note (Signed)
- 

## 2023-02-04 NOTE — Assessment & Plan Note (Signed)
-   His methotrexate will be continued.

## 2023-02-04 NOTE — Assessment & Plan Note (Addendum)
-   We will continue Wellbutrin XL. 

## 2023-02-04 NOTE — ED Provider Notes (Signed)
[place] EMERGENCY DEPARTMENT AT [facility] Provider Note   CSN: [id number] Arrival date & time: 02/04/23  1237     History  Chief Complaint  Patient presents with   Fatigue    Steven [name] is a 88 y.o. male.  HPI   Patient states he has been having trouble with dizziness and weakness ongoing for several months.  Patient states he has a history of hyperlipidemia ankylosing spondylitis small bowel obstructions TIA, stroke.  Patient states he has been having issues with falls.  He has not been able to get up on his own and he has to call EMS.  Patient has been evaluated by neurology in the past.  He was seen on September 27 of this year.  Patient had an MRI this month.  There was no evidence of any acute abnormality.  Patient states he had another episode of feeling weak and falling today.  He was not able to stand up on his own.  Patient had to call EMS.  Patient states he feels generally weak.  He did have an episode of nausea vomiting today.  He denies any abdominal pain.  He has noted some cough and congestion.  EMS noted the patient had a temperature today.  Home Medications Prior to Admission medications   Medication Sig Start Date End Date Taking? Authorizing Provider  Artificial Tear Solution (SOOTHE XP) SOLN Place 1 drop into both eyes 2 (two) times daily.   Yes [provider]  Biotin 5000 MCG TABS Take 5,000 mcg by mouth daily.   Yes [provider]  buPROPion (WELLBUTRIN XL) 300 MG 24 hr tablet Take 300 mg by mouth daily.   Yes [provider]  Calcium Carb-Cholecalciferol (CALCIUM+D3 PO) Take 2 tablets by mouth 2 (two) times daily with a meal.   Yes [provider]  Calcium Citrate 250 MG TABS Take 250 mg by mouth daily.   Yes [provider]  Cholecalciferol (VITAMIN D3) 1000 units CAPS Take 1,000 Units by mouth daily.   Yes [provider]  clopidogrel (PLAVIX) 75 MG tablet Take 1 tablet (75 mg total) by  mouth daily. Patient taking differently: Take 75 mg by mouth at bedtime. 04/02/21  Yes Hongalgi, Maximino Greenland, MD  cyanocobalamin (VITAMIN B12) 1000 MCG tablet Take 1,000 mcg by mouth daily.   Yes [provider]  ferrous sulfate 325 (65 FE) MG tablet Take 1 tablet (325 mg total) by mouth daily with breakfast. 06/07/21 02/04/23 Yes Zigmund Daniel., MD  fluoruracil [facility]) 0.5 % cream Apply 1 application  topically daily as needed (for cancer treatment- as directed to affected areas).   Yes [provider]  folic acid (FOLVITE) 1 MG tablet Take 1 mg by mouth in the morning, at noon, and at bedtime.   Yes [provider]  GEMTESA 75 MG TABS Take 75 mg by mouth at bedtime. 06/01/22  Yes [provider]  GOODSENSE ASPIRIN 325 MG tablet Take 325 mg by mouth daily.   Yes [provider]  ipratropium (ATROVENT) 0.06 % nasal spray Place 2 sprays into both nostrils 2 (two) times daily as needed (allergies). 06/05/19  Yes [provider]  methotrexate (RHEUMATREX) 2.5 MG tablet Take 20 mg by mouth every Sunday.   Yes [provider]  mirtazapine (REMERON) 7.5 MG tablet Take 7.5 mg by mouth at bedtime. 01/25/23  Yes [provider]  Multiple Vitamins-Minerals (PRESERVISION AREDS 2 PO) Take 1 tablet by mouth  in the morning and at bedtime.   Yes [provider]  pantoprazole (PROTONIX) 40 MG tablet Take 1 tablet (40 mg total) by mouth daily. Patient taking differently: Take 40 mg by mouth daily before breakfast. 04/02/21  Yes Hongalgi, Maximino Greenland, MD  rosuvastatin (CRESTOR) 20 MG tablet Take 1 tablet (20 mg total) by mouth at bedtime. 04/01/21  Yes Hongalgi, Maximino Greenland, MD  TYLENOL 8 HOUR ARTHRITIS PAIN 650 MG CR tablet Take 650 mg by mouth every 8 (eight) hours as needed for pain.   Yes [provider]  zaleplon (SONATA) 5 MG capsule Take 5 mg by mouth at bedtime as needed for sleep. 01/11/23  Yes [provider]  aspirin  EC 325 MG EC tablet Take 1 tablet (325 mg total) by mouth daily. Patient not taking: Reported on 02/04/2023 04/02/21   Elease Etienne, MD      Allergies    Gluten meal, Indomethacin, and Lactose intolerance (gi)    Review of Systems   Review of Systems  Physical Exam Updated Vital Signs BP 103/70 (BP Location: Right Arm)   Pulse 86   Temp 99 F (37.2 C) (Oral)   Resp 16   Ht 1.676 m (5\' 6" )   Wt 55.8 kg   SpO2 99%   BMI 19.86 kg/m  Physical Exam Vitals and nursing note reviewed.  Constitutional:      Appearance: He is well-developed. He is not diaphoretic.     Comments: Elderly, frail  HENT:     Head: Normocephalic and atraumatic.     Right Ear: External ear normal.     Left Ear: External ear normal.  Eyes:     General: No scleral icterus.       Right eye: No discharge.        Left eye: No discharge.     Conjunctiva/sclera: Conjunctivae normal.  Neck:     Trachea: No tracheal deviation.  Cardiovascular:     Rate and Rhythm: Normal rate and regular rhythm.  Pulmonary:     Effort: Pulmonary effort is normal. No respiratory distress.     Breath sounds: Normal breath sounds. No stridor. No wheezing or rales.  Abdominal:     General: Bowel sounds are normal. There is no distension.     Palpations: Abdomen is soft.     Tenderness: There is no abdominal tenderness. There is no guarding or rebound.  Musculoskeletal:        General: No tenderness or deformity.     Cervical back: Neck supple.     Comments: No tenderness palpation cervical thoracic or lumbar spine, no tenderness in the extremities  Skin:    General: Skin is warm and dry.     Findings: No rash.  Neurological:     General: No focal deficit present.     Mental Status: He is alert.     Cranial Nerves: No cranial nerve deficit, dysarthria or facial asymmetry.     Sensory: No sensory deficit.     Motor: No abnormal muscle tone or seizure activity.     Coordination: Coordination normal.  Psychiatric:         Mood and Affect: Mood normal.     ED Results / Procedures / Treatments   Labs (all labs ordered are listed, but only abnormal results are displayed) Labs Reviewed  COMPREHENSIVE METABOLIC PANEL - Abnormal; Notable for the following components:      Result Value   Glucose, Bld 136 (*)  Calcium 8.1 (*)    Albumin 3.2 (*)    All other components within normal limits  CBC WITH DIFFERENTIAL/PLATELET - Abnormal; Notable for the following components:   WBC 15.4 (*)    RBC 3.70 (*)    Hemoglobin 12.1 (*)    HCT 36.5 (*)    Neutro Abs 13.9 (*)    Lymphs Abs 0.3 (*)    Abs Immature Granulocytes 0.08 (*)    All other components within normal limits  I-STAT CG4 LACTIC ACID, ED - Abnormal; Notable for the following components:   Lactic Acid, Venous 2.4 (*)    All other components within normal limits  I-STAT CG4 LACTIC ACID, ED - Abnormal; Notable for the following components:   Lactic Acid, Venous 2.5 (*)    All other components within normal limits  RESP PANEL BY RT-PCR (RSV, FLU A&B, COVID)  RVPGX2  CULTURE, BLOOD (ROUTINE X 2)  CULTURE, BLOOD (ROUTINE X 2)  URINALYSIS, W/ REFLEX TO CULTURE (INFECTION SUSPECTED)  LIPASE, BLOOD  PROTIME-INR  CORTISOL-AM, BLOOD  PROCALCITONIN  BASIC METABOLIC PANEL  CBC    EKG None  Radiology DG Chest 2 View  Result Date: 02/04/2023 CLINICAL DATA:  Cough and fever.  Congestion. EXAM: CHEST - 2 VIEW COMPARISON:  Radiograph 07/11/2022, CT 08/14/2023 FINDINGS: Patchy airspace disease in the right mid lung suspicious for pneumonia. The heart is normal in size. Aortic atherosclerosis. No pleural effusion, pulmonary edema, or pneumothorax. No evidence of acute osseous abnormality. IMPRESSION: Patchy airspace disease in the right mid lung suspicious for pneumonia. Recommend radiographic follow-up to resolution. Electronically Signed   By: Narda Rutherford M.D.   On: 02/04/2023 16:41    Procedures Procedures    Medications Ordered in  ED Medications  aspirin EC tablet 325 mg (has no administration in time range)  rosuvastatin (CRESTOR) tablet 20 mg (has no administration in time range)  buPROPion (WELLBUTRIN XL) 24 hr tablet 300 mg (has no administration in time range)  pantoprazole (PROTONIX) EC tablet 40 mg (has no administration in time range)  mirabegron ER (MYRBETRIQ) tablet 25 mg (has no administration in time range)  clopidogrel (PLAVIX) tablet 75 mg (has no administration in time range)  ferrous sulfate tablet 325 mg (has no administration in time range)  folic acid (FOLVITE) tablet 1 mg (has no administration in time range)  calcium carbonate (OS-CAL - dosed in mg of elemental calcium) tablet 625 mg (has no administration in time range)  cholecalciferol (VITAMIN D3) 25 MCG (1000 UNIT) tablet 1,000 Units (has no administration in time range)  ipratropium (ATROVENT) 0.06 % nasal spray 2 spray (has no administration in time range)  multivitamin (PROSIGHT) tablet 1 tablet (has no administration in time range)  lactated ringers infusion (150 mL/hr Intravenous New Bag/Given 02/04/23 1802)  enoxaparin (LOVENOX) injection 40 mg (has no administration in time range)  cefTRIAXone (ROCEPHIN) 2 g in sodium chloride 0.9 % 100 mL IVPB (2 g Intravenous New Bag/Given 02/04/23 1755)  azithromycin (ZITHROMAX) 500 mg in sodium chloride 0.9 % 250 mL IVPB (has no administration in time range)  acetaminophen (TYLENOL) tablet 650 mg (has no administration in time range)    Or  acetaminophen (TYLENOL) suppository 650 mg (has no administration in time range)  traZODone (DESYREL) tablet 25 mg (has no administration in time range)  magnesium hydroxide (MILK OF MAGNESIA) suspension 30 mL (has no administration in time range)  ondansetron (ZOFRAN) tablet 4 mg (has no administration in time range)    Or  ondansetron [facility]) injection 4 mg (has no administration in time range)  acetaminophen (TYLENOL) tablet 650 mg (650 mg Oral Given  02/04/23 1353)  lactated ringers bolus 1,000 mL (0 mLs Intravenous Stopped 02/04/23 1500)  lactated ringers bolus 1,000 mL (1,000 mLs Intravenous New Bag/Given 02/04/23 1643)    ED Course/ Medical Decision Making/ A&P Clinical Course as of 02/04/23 1823  Thu Feb 04, 2023  1435 CBC shows leukocytosis of 15.  Lactic acid elevated at 2.4 [JK]  1435 No significant abnormalities noted on comprehensive metabolic panel [JK]  1538 COVID flu RSV negative [JK]  1612 CXR questionable pna right side [JK]    Clinical Course User Index [JK] Linwood Dibbles, MD                                 Medical Decision Making Problems Addressed: Community acquired pneumonia of right lung, unspecified part of lung: acute illness or injury that poses a threat to life or bodily functions Febrile illness, acute: acute illness or injury that poses a threat to life or bodily functions  Amount and/or Complexity of Data Reviewed Labs: ordered. Decision-making details documented in ED Course. Radiology: ordered and independent interpretation performed.  Risk OTC drugs. Decision regarding hospitalization.   Pt presented with fever, weakness.  Noted to have a temp of 101 in the ED.  Recent cough, uri symptoms.  CXR suggestive of possible pna.  WBC and lactic acid level increased.  NO hypotension or signs of severe sepsis in the ED.  Pt started on IV abx, IV fluids.  Admit for further treatment.  Case discussed with Dr Arville Care        Final Clinical Impression(s) / ED Diagnoses Final diagnoses:  Febrile illness, acute  Community acquired pneumonia of right lung, unspecified part of lung    Rx / DC Orders ED Discharge Orders     None         Linwood Dibbles, MD 02/04/23 1825

## 2023-02-04 NOTE — ED Triage Notes (Signed)
BIBA from home for nasal congestion and cough since yesterday. Generalized weakness, n/v began today. PO zofran given PTA. 20 g right ac 99.6 temp 98 HR 30 rr 130/68 BP 99% room air 169 cbg

## 2023-02-04 NOTE — Assessment & Plan Note (Signed)
-   We will continue PPI therapy 

## 2023-02-04 NOTE — ED Notes (Signed)
ED TO INPATIENT HANDOFF REPORT  ED Nurse Name and Phone #: Mellody Dance [phone]  S Name/Age/Gender Steven [name] 88 y.o. male Room/Bed: WA24/WA24  Code Status   Code Status: Full Code  Home/SNF/Other Is this baseline?   Triage Complete: Triage complete  Chief Complaint Sepsis due to pneumonia (HCC) [J18.9, A41.9]  Triage Note BIBA from home for nasal congestion and cough since yesterday. Generalized weakness, n/v began today. PO zofran given PTA. 20 g right ac 99.6 temp 98 HR 30 rr 130/68 BP 99% room air 169 cbg    Allergies Allergies  Allergen Reactions   Gluten Meal     GLUTEN ALLERGY/INTOLERANCE  Other Reaction(s): Other (See Comments)  GLUTEN ALLERGY/INTOLERANCE, GLUTEN ALLERGY/INTOLERANCE   Indomethacin Hives and Other (See Comments)   Lactose Intolerance (Gi)     GI UPSET   Lactulose Nausea And Vomiting    GI UPSET, GI UPSET    Level of Care/Admitting Diagnosis ED Disposition     ED Disposition  Admit   Condition  --   Comment  Hospital Area: [facility] [[id number]]  Level of Care: Telemetry [5]  Admit to tele based on following criteria: Other see comments  Comments: sepsis  May admit patient to [facility] or [facility] if equivalent level of care is available:: No  Covid Evaluation: Asymptomatic - no recent exposure (last 10 days) testing not required  Diagnosis: Sepsis due to pneumonia [facility]) [[id number]]  Admitting Physician: Hannah Beat [[id number]]  Attending Physician: Hannah Beat [[id number]]  Certification:: I certify this patient will need inpatient services for at least 2 midnights  Expected Medical Readiness: 02/06/2023          B Medical/Surgery History Past Medical History:  Diagnosis Date   Allergy    enviromental   Anemia 2017   Anxiety    Arthritis    ankylosing spodilitis   Blood transfusion without reported diagnosis    during surgery   Cataract    Depression    Dyspnea    with exertion - had Echo done  09/29/16   History of kidney stones    Hyperlipidemia    Intestinal obstruction (HCC)    Pneumonia    as a child   Past Surgical History:  Procedure Laterality Date   ABDOMINAL SURGERY     had abcess   APPENDECTOMY     CHOLECYSTECTOMY     with lysis of adhesions   COLONOSCOPY     COLOSTOMY     COLOSTOMY REVERSAL     EYE SURGERY Left    scar tissue removed from cornea   EYE SURGERY Right    cataract surgery with lens implant   JOINT REPLACEMENT Right    hip  x 2 1999 and 2007   SHOULDER ARTHROSCOPY WITH ROTATOR CUFF REPAIR AND SUBACROMIAL DECOMPRESSION Left 03/02/2013   Procedure: LEFT SHOULDER ARTHROSCOPY WITH SUBACROMIAL DECOMPRESSION DISTAL CALVICLE RESECTION AND ROTATOR CUFF REPAIR ;  Surgeon: Senaida Lange, MD;  Location: [facility] OR;  Service: Orthopedics;  Laterality: Left;   TOTAL HIP REVISION Right 11/06/2016   Procedure: TOTAL HIP REVISION OF THE ACETABULAR COMPONENT;  Surgeon: Gean Birchwood, MD;  Location: [facility] OR;  Service: Orthopedics;  Laterality: Right;   TRANSCAROTID ARTERY REVASCULARIZATION  Left 03/31/2021   Procedure: TRANSCAROTID ARTERY REVASCULARIZATION;  Surgeon: Victorino Sparrow, MD;  Location: [facility] OR;  Service: Vascular;  Laterality: Left;   ULTRASOUND GUIDANCE FOR VASCULAR ACCESS Right 03/31/2021   Procedure: ULTRASOUND GUIDANCE FOR VASCULAR ACCESS;  Surgeon: Victorino Sparrow, MD;  Location: [facility] OR;  Service: Vascular;  Laterality: Right;     A IV Location/Drains/Wounds Patient Lines/Drains/Airways Status     Active Line/Drains/Airways     Name Placement date Placement time Site Days   Peripheral IV 02/04/23 20 G Left Forearm 02/04/23  1250  Forearm  less than 1   Peripheral IV 02/04/23 20 G Posterior;Proximal;Right Forearm 02/04/23  1343  Forearm  less than 1            Intake/Output Last 24 hours  Intake/Output Summary (Last 24 hours) at 02/04/2023 1652 Last data filed at 02/04/2023 1500 Gross per 24 hour  Intake 1000 ml  Output --  Net 1000 ml     Labs/Imaging Results for orders placed or performed during the hospital encounter of 02/04/23 (from the past 48 hour(s))  Comprehensive metabolic panel     Status: Abnormal   Collection Time: 02/04/23  1:02 PM  Result Value Ref Range   Sodium 137 135 - 145 mmol/L   Potassium 3.5 3.5 - 5.1 mmol/L   Chloride 104 98 - 111 mmol/L   CO2 24 22 - 32 mmol/L   Glucose, Bld 136 (H) 70 - 99 mg/dL    Comment: Glucose reference range applies only to samples taken after fasting for at least 8 hours.   BUN 16 8 - 23 mg/dL   Creatinine, Ser 4.25 0.61 - 1.24 mg/dL   Calcium 8.1 (L) 8.9 - 10.3 mg/dL   Total Protein 7.1 6.5 - 8.1 g/dL   Albumin 3.2 (L) 3.5 - 5.0 g/dL   AST 15 15 - 41 U/L   ALT 12 0 - 44 U/L   Alkaline Phosphatase 68 38 - 126 U/L   Total Bilirubin 0.7 0.3 - 1.2 mg/dL   GFR, Estimated >95 >63 mL/min    Comment: (NOTE) Calculated using the CKD-EPI Creatinine Equation (2021)    Anion gap 9 5 - 15    Comment: Performed at [facility], [address], [place], [place] [address]  CBC with Differential     Status: Abnormal   Collection Time: 02/04/23  1:02 PM  Result Value Ref Range   WBC 15.4 (H) 4.0 - 10.5 K/uL   RBC 3.70 (L) 4.22 - 5.81 MIL/uL   Hemoglobin 12.1 (L) 13.0 - 17.0 g/dL   HCT 33.2 (L) 95.1 - 88.4 %   MCV 98.6 80.0 - 100.0 fL   MCH 32.7 26.0 - 34.0 pg   MCHC 33.2 30.0 - 36.0 g/dL   RDW 16.6 06.3 - 01.6 %   Platelets 295 150 - 400 K/uL   nRBC 0.0 0.0 - 0.2 %   Neutrophils Relative % 91 %   Neutro Abs 13.9 (H) 1.7 - 7.7 K/uL   Lymphocytes Relative 2 %   Lymphs Abs 0.3 (L) 0.7 - 4.0 K/uL   Monocytes Relative 6 %   Monocytes Absolute 1.0 0.1 - 1.0 K/uL   Eosinophils Relative 0 %   Eosinophils Absolute 0.1 0.0 - 0.5 K/uL   Basophils Relative 0 %   Basophils Absolute 0.0 0.0 - 0.1 K/uL   Immature Granulocytes 1 %   Abs Immature Granulocytes 0.08 (H) 0.00 - 0.07 K/uL    Comment: Performed at [facility], [address], [place], [place] [address]  Lipase, blood     Status: None   Collection Time: 02/04/23  1:02 PM  Result Value Ref Range   Lipase 24 11 -  51 U/L    Comment: Performed at [facility], [address], [place], [place] [address]  I-Stat Lactic Acid, ED     Status: Abnormal   Collection Time: 02/04/23  1:21 PM  Result Value Ref Range   Lactic Acid, Venous 2.4 (HH) 0.5 - 1.9 mmol/L   Comment NOTIFIED PHYSICIAN   Resp panel by RT-PCR (RSV, Flu A&B, Covid) Anterior Nasal Swab     Status: None   Collection Time: 02/04/23  1:52 PM   Specimen: Anterior Nasal Swab  Result Value Ref Range   SARS Coronavirus 2 by RT PCR NEGATIVE NEGATIVE    Comment: (NOTE) SARS-CoV-2 target nucleic acids are NOT DETECTED.  The SARS-CoV-2 RNA is generally detectable in upper respiratory specimens during the acute phase of infection. The lowest concentration of SARS-CoV-2 viral copies this assay can detect is 138 copies/mL. A negative result does not preclude SARS-Cov-2 infection and should not be used as the sole basis for treatment or other patient management decisions. A negative result may occur with  improper specimen collection/handling, submission of specimen other than nasopharyngeal swab, presence of viral mutation(s) within the areas targeted by this assay, and inadequate number of viral copies(<138 copies/mL). A negative result must be combined with clinical observations, patient history, and epidemiological information. The expected result is Negative.  Fact Sheet for Patients:  BloggerCourse.com  Fact Sheet for Healthcare Providers:  SeriousBroker.it  This test is no t yet approved or cleared by the [place] FDA and  has been authorized for detection and/or diagnosis of SARS-CoV-2 by FDA under an Emergency Use Authorization (EUA). This EUA will remain  in effect (meaning this test can be used) for the duration of  the COVID-19 declaration under Section 564(b)(1) of the Act, 21 U.S.C.section 360bbb-3(b)(1), unless the authorization is terminated  or revoked sooner.       Influenza A by PCR NEGATIVE NEGATIVE   Influenza B by PCR NEGATIVE NEGATIVE    Comment: (NOTE) The Xpert Xpress SARS-CoV-2/FLU/RSV plus assay is intended as an aid in the diagnosis of influenza from Nasopharyngeal swab specimens and should not be used as a sole basis for treatment. Nasal washings and aspirates are unacceptable for Xpert Xpress SARS-CoV-2/FLU/RSV testing.  Fact Sheet for Patients: BloggerCourse.com  Fact Sheet for Healthcare Providers: SeriousBroker.it  This test is not yet approved or cleared by the [place] FDA and has been authorized for detection and/or diagnosis of SARS-CoV-2 by FDA under an Emergency Use Authorization (EUA). This EUA will remain in effect (meaning this test can be used) for the duration of the COVID-19 declaration under Section 564(b)(1) of the Act, 21 U.S.C. section 360bbb-3(b)(1), unless the authorization is terminated or revoked.     Resp Syncytial Virus by PCR NEGATIVE NEGATIVE    Comment: (NOTE) Fact Sheet for Patients: BloggerCourse.com  Fact Sheet for Healthcare Providers: SeriousBroker.it  This test is not yet approved or cleared by the [place] FDA and has been authorized for detection and/or diagnosis of SARS-CoV-2 by FDA under an Emergency Use Authorization (EUA). This EUA will remain in effect (meaning this test can be used) for the duration of the COVID-19 declaration under Section 564(b)(1) of the Act, 21 U.S.C. section 360bbb-3(b)(1), unless the authorization is terminated or revoked.  Performed at [facility], [address] [place], [place] [address]   I-Stat Lactic Acid, ED     Status: Abnormal   Collection Time: 02/04/23  3:37  PM  Result Value Ref Range  Lactic Acid, Venous 2.5 (HH) 0.5 - 1.9 mmol/L   DG Chest 2 View  Result Date: 02/04/2023 CLINICAL DATA:  Cough and fever.  Congestion. EXAM: CHEST - 2 VIEW COMPARISON:  Radiograph 07/11/2022, CT 08/14/2023 FINDINGS: Patchy airspace disease in the right mid lung suspicious for pneumonia. The heart is normal in size. Aortic atherosclerosis. No pleural effusion, pulmonary edema, or pneumothorax. No evidence of acute osseous abnormality. IMPRESSION: Patchy airspace disease in the right mid lung suspicious for pneumonia. Recommend radiographic follow-up to resolution. Electronically Signed   By: Narda Rutherford M.D.   On: 02/04/2023 16:41    Pending Labs Unresulted Labs (From admission, onward)     Start     Ordered   02/05/23 0500  Protime-INR  Tomorrow morning,   R        02/04/23 1638   02/05/23 0500  Cortisol-am, blood  Tomorrow morning,   R        02/04/23 1638   02/05/23 0500  Procalcitonin  Tomorrow morning,   R       References:    Procalcitonin Lower Respiratory Tract Infection AND Sepsis Procalcitonin Algorithm   02/04/23 1638   02/05/23 0500  Basic metabolic panel  Tomorrow morning,   R        02/04/23 1638   02/05/23 0500  CBC  Tomorrow morning,   R        02/04/23 1638   02/04/23 1317  Blood culture (routine x 2)  BLOOD CULTURE X 2,   R      02/04/23 1317   02/04/23 1257  Urinalysis, w/ Reflex to Culture (Infection Suspected) -Urine, Clean Catch  Once,   URGENT       Question:  Specimen Source  Answer:  Urine, Clean Catch   02/04/23 1257            Vitals/Pain Today's Vitals   02/04/23 1254 02/04/23 1400 02/04/23 1510  BP: 117/62 131/60 105/61  Pulse: 95 94 91  Resp: (!) 22 18 18   Temp: (!) 101.4 F (38.6 C)  98.8 F (37.1 C)  TempSrc: Oral  Oral  SpO2: 98% 99% 99%    Isolation Precautions No active isolations  Medications Medications  aspirin EC tablet 325 mg (has no administration in time range)  methotrexate (RHEUMATREX)  tablet 20 mg (has no administration in time range)  rosuvastatin (CRESTOR) tablet 20 mg (has no administration in time range)  buPROPion (WELLBUTRIN XL) 24 hr tablet 150 mg (has no administration in time range)  citalopram (CELEXA) tablet 20 mg (has no administration in time range)  pantoprazole (PROTONIX) EC tablet 40 mg (has no administration in time range)  Benefiber POWD 1 Scoop (has no administration in time range)  mirabegron ER (MYRBETRIQ) tablet 25 mg (has no administration in time range)  clopidogrel (PLAVIX) tablet 75 mg (has no administration in time range)  ferrous sulfate tablet 325 mg (has no administration in time range)  folic acid (FOLVITE) tablet 1 mg (has no administration in time range)  Biotin TABS 1 tablet (has no administration in time range)  Calcium Citrate TABS 250 mg (has no administration in time range)  cholecalciferol (VITAMIN D3) 25 MCG (1000 UNIT) tablet 1,000 Units (has no administration in time range)  ipratropium (ATROVENT) 0.06 % nasal spray 2 spray (has no administration in time range)  PreserVision AREDS 2 CAPS 2 capsule (has no administration in time range)  lactated ringers infusion (has no administration in time range)  enoxaparin (LOVENOX) injection  40 mg (has no administration in time range)  cefTRIAXone (ROCEPHIN) 2 g in sodium chloride 0.9 % 100 mL IVPB (has no administration in time range)  azithromycin (ZITHROMAX) 500 mg in sodium chloride 0.9 % 250 mL IVPB (has no administration in time range)  acetaminophen (TYLENOL) tablet 650 mg (has no administration in time range)    Or  acetaminophen (TYLENOL) suppository 650 mg (has no administration in time range)  traZODone (DESYREL) tablet 25 mg (has no administration in time range)  magnesium hydroxide (MILK OF MAGNESIA) suspension 30 mL (has no administration in time range)  ondansetron (ZOFRAN) tablet 4 mg (has no administration in time range)    Or  ondansetron (ZOFRAN) injection 4 mg (has no  administration in time range)  acetaminophen (TYLENOL) tablet 650 mg (650 mg Oral Given 02/04/23 1353)  lactated ringers bolus 1,000 mL (0 mLs Intravenous Stopped 02/04/23 1500)  lactated ringers bolus 1,000 mL (1,000 mLs Intravenous New Bag/Given 02/04/23 1643)    Mobility      Focused Assessments    R Recommendations: See Admitting Provider Note  Report given to:   Additional Notes:

## 2023-02-04 NOTE — Assessment & Plan Note (Signed)
-   We will continue statin therapy. 

## 2023-02-04 NOTE — Assessment & Plan Note (Signed)
-   Sepsis manifested by fever, tachycardia and tachypnea with leukocytosis. - This like secondary to community-acquired pneumonia that is likely bacterial right-sided midlung zone pneumonia. - The patient will be admitted to a medical telemetry bed. - Will continue antibiotic therapy with IV Rocephin and Zithromax. - Mucolytic therapy be provided as well as duo nebs q.i.d. and q.4 hours p.r.n. - We will follow blood cultures.

## 2023-02-04 NOTE — H&P (Signed)
[place]   PATIENT NAME: Steven [name]    MR#:  [id number]  DATE OF BIRTH:  08/04/1934  DATE OF ADMISSION:  02/04/2023  PRIMARY CARE PHYSICIAN: [name], MD   Patient is coming from: Home  REQUESTING/REFERRING PHYSICIAN: Linwood Dibbles, MD  CHIEF COMPLAINT:   Chief Complaint  Patient presents with   Fatigue    HISTORY OF PRESENT ILLNESS:  Steven [name] is a 88 y.o. male with medical history significant for ankylosing spondylitis, anxiety, osteoarthritis, depression, dyslipidemia and SBO, who presented to the emergency room with acute onset of recurrent falls.  He denied any loss of consciousness.  He stated that he fell and could not get up today from generalized weakness.  He experienced dizziness and lightheadedness before that but has been having chronic dizziness.  No focal muscle weakness or paresthesias.  No dysarthria or dysphagia.  No tinnitus or vertigo.  He admitted to nausea and vomiting this a.m. without diarrhea.  He admitted to chills but did not have fever until he came to the ER.  No dysuria, oliguria or hematuria or flank pain.  He has been having cough that is mainly dry with associated chest congestion without significant dyspnea or wheezing.  ED Course: When he came to the ER, temperature was one 1.4 with heart rate of 95 and respiratory to 22, pulse currently 90% on room air and BP 117/62.  Labs revealed borderline potassium of 3.5 and albumin 3.2, blood glucose of 136 with otherwise unremarkable CMP.  Lactic acid with 2.4 and later 2.5.  CBC showed leukocytosis 15.4 with neutrophilia and anemia.  Respiratory panel came back negative.  UA was unremarkable.  Blood cultures were sent. EKG as reviewed by me : None Imaging: Two-view chest x-ray showed patchy airspace disease in the right midlung suspicious for pneumonia.  The patient was given IV Rocephin and Zithromax as well as 1 L bolus of IV lactated ringer.  He will be admitted to a medical telemetry bed  for further evaluation and management. PAST MEDICAL HISTORY:   Past Medical History:  Diagnosis Date   Allergy    enviromental   Anemia 2017   Anxiety    Arthritis    ankylosing spodilitis   Blood transfusion without reported diagnosis    during surgery   Cataract    Depression    Dyspnea    with exertion - had Echo done 09/29/16   History of kidney stones    Hyperlipidemia    Intestinal obstruction (HCC)    Pneumonia    as a child    PAST SURGICAL HISTORY:   Past Surgical History:  Procedure Laterality Date   ABDOMINAL SURGERY     had abcess   APPENDECTOMY     CHOLECYSTECTOMY     with lysis of adhesions   COLONOSCOPY     COLOSTOMY     COLOSTOMY REVERSAL     EYE SURGERY Left    scar tissue removed from cornea   EYE SURGERY Right    cataract surgery with lens implant   JOINT REPLACEMENT Right    hip  x 2 1999 and 2007   SHOULDER ARTHROSCOPY WITH ROTATOR CUFF REPAIR AND SUBACROMIAL DECOMPRESSION Left 03/02/2013   Procedure: LEFT SHOULDER ARTHROSCOPY WITH SUBACROMIAL DECOMPRESSION DISTAL CALVICLE RESECTION AND ROTATOR CUFF REPAIR ;  Surgeon: Senaida Lange, MD;  Location: [facility] OR;  Service: Orthopedics;  Laterality: Left;   TOTAL HIP REVISION Right 11/06/2016   Procedure: TOTAL HIP  REVISION OF THE ACETABULAR COMPONENT;  Surgeon: Gean Birchwood, MD;  Location: [facility] OR;  Service: Orthopedics;  Laterality: Right;   TRANSCAROTID ARTERY REVASCULARIZATION  Left 03/31/2021   Procedure: TRANSCAROTID ARTERY REVASCULARIZATION;  Surgeon: Victorino Sparrow, MD;  Location: [facility] OR;  Service: Vascular;  Laterality: Left;   ULTRASOUND GUIDANCE FOR VASCULAR ACCESS Right 03/31/2021   Procedure: ULTRASOUND GUIDANCE FOR VASCULAR ACCESS;  Surgeon: Victorino Sparrow, MD;  Location: [facility] OR;  Service: Vascular;  Laterality: Right;    SOCIAL HISTORY:   Social History   Tobacco Use   Smoking status: Former    Current packs/day: 0.00    Average packs/day: 1 pack/day for 9.0 years (9.0 ttl pk-yrs)     Types: Cigarettes    Start date: 04/13/1956    Quit date: 04/13/1965    Years since quitting: 57.8    Passive exposure: Never   Smokeless tobacco: Never  Substance Use Topics   Alcohol use: Yes    Comment: 3 beers or glasses of wine a day--reports stopped ETOH 11/01/16     FAMILY HISTORY:   Family History  Problem Relation Age of Onset   Heart attack Mother    Diabetes Mother    Breast cancer Mother    Heart attack Maternal Grandfather    Pancreatic cancer Brother    Colon cancer Neg Hx    Esophageal cancer Neg Hx    Stomach cancer Neg Hx     DRUG ALLERGIES:   Allergies  Allergen Reactions   Gluten Meal Other (See Comments)    GLUTEN ALLERGY    Indomethacin Hives   Lactose Intolerance (Gi) Nausea And Vomiting and Other (See Comments)    GI UPSET    REVIEW OF SYSTEMS:   ROS As per history of present illness. All pertinent systems were reviewed above. Constitutional, HEENT, cardiovascular, respiratory, GI, GU, musculoskeletal, neuro, psychiatric, endocrine, integumentary and hematologic systems were reviewed and are otherwise negative/unremarkable except for positive findings mentioned above in the HPI.   MEDICATIONS AT HOME:   Prior to Admission medications   Medication Sig Start Date End Date Taking? Authorizing Provider  aspirin EC 325 MG EC tablet Take 1 tablet (325 mg total) by mouth daily. 04/02/21   Hongalgi, Maximino Greenland, MD  Biotin 5000 MCG TABS Take 1 tablet by mouth daily.    [provider]  buPROPion (WELLBUTRIN XL) 150 MG 24 hr tablet Take 150 mg by mouth daily. 12/16/22   [provider]  Calcium Citrate 250 MG TABS Take 1 tablet by mouth daily.    [provider]  cholecalciferol (VITAMIN D) 1000 units tablet Take 1,000 Units by mouth daily.    [provider]  citalopram (CELEXA) 20 MG tablet Take 20 mg by mouth daily.     [provider]  clopidogrel (PLAVIX) 75 MG tablet Take 1 tablet (75 mg total) by mouth  daily. 04/02/21   Hongalgi, Maximino Greenland, MD  ferrous sulfate 325 (65 FE) MG tablet Take 1 tablet (325 mg total) by mouth daily with breakfast. 06/07/21 02/04/23  Zigmund Daniel., MD  folic acid (FOLVITE) 1 MG tablet Take 1 mg by mouth in the morning, at noon, and at bedtime.    [provider]  GEMTESA 75 MG TABS Take 1 tablet by mouth daily. 06/01/22   [provider]  ipratropium (ATROVENT) 0.06 % nasal spray Place 2 sprays into both nostrils 2 (two) times daily as needed (allergies). 06/05/19   [provider]  methotrexate (RHEUMATREX) 2.5 MG tablet Take 20 mg by mouth once a week. TAKE 8 TABLETS (20 MG) BY MOUTH ONCE A WEEK.    [provider]  Multiple Vitamins-Minerals (PRESERVISION AREDS 2 PO) Take 1 tablet by mouth in the morning and at bedtime.    [provider]  pantoprazole (PROTONIX) 40 MG tablet Take 1 tablet (40 mg total) by mouth daily. 04/02/21   Hongalgi, Maximino Greenland, MD  rosuvastatin (CRESTOR) 20 MG tablet Take 1 tablet (20 mg total) by mouth at bedtime. 04/01/21   Hongalgi, Maximino Greenland, MD  Wheat Dextrin (BENEFIBER) POWD Take 1 Scoop by mouth daily. 10/31/20   [provider]      VITAL SIGNS:  Blood pressure 103/70, pulse 86, temperature 99 F (37.2 C), temperature source Oral, resp. rate 16, height 5\' 6"  (1.676 m), weight 55.8 kg, SpO2 99%.  PHYSICAL EXAMINATION:  Physical Exam  GENERAL:  88 y.o.-year-old Caucasian male patient lying in the bed with no acute distress.  EYES: Pupils equal, round, reactive to light and accommodation. No scleral icterus. Extraocular muscles intact.  HEENT: Head atraumatic, normocephalic. Oropharynx and nasopharynx clear.  NECK:  Supple, no jugular venous distention. No thyroid enlargement, no tenderness.  LUNGS: Slightly diminished right midlung zone breath sounds with associated crackles.. No use of accessory muscles of respiration.  CARDIOVASCULAR: Regular rate and rhythm, S1, S2 normal. No  murmurs, rubs, or gallops.  ABDOMEN: Soft, nondistended, nontender. Bowel sounds present. No organomegaly or mass.  EXTREMITIES: No pedal edema, cyanosis, or clubbing.  NEUROLOGIC: Cranial nerves II through XII are intact. Muscle strength 5/5 in all extremities. Sensation intact. Gait not checked.  PSYCHIATRIC: The patient is alert and oriented x 3.  Normal affect and good eye contact. SKIN: No obvious rash, lesion, or ulcer.   LABORATORY PANEL:   CBC Recent Labs  Lab 02/04/23 1302  WBC 15.4*  HGB 12.1*  HCT 36.5*  PLT 295   ------------------------------------------------------------------------------------------------------------------  Chemistries  Recent Labs  Lab 02/04/23 1302  NA 137  K 3.5  CL 104  CO2 24  GLUCOSE 136*  BUN 16  CREATININE 0.71  CALCIUM 8.1*  AST 15  ALT 12  ALKPHOS 68  BILITOT 0.7   ------------------------------------------------------------------------------------------------------------------  Cardiac Enzymes No results for input(s): "TROPONINI" in the last 168 hours. ------------------------------------------------------------------------------------------------------------------  RADIOLOGY:  DG Chest 2 View  Result Date: 02/04/2023 CLINICAL DATA:  Cough and fever.  Congestion. EXAM: CHEST - 2 VIEW COMPARISON:  Radiograph 07/11/2022, CT 08/14/2023 FINDINGS: Patchy airspace disease in the right mid lung suspicious for pneumonia. The heart is normal in size. Aortic atherosclerosis. No pleural effusion, pulmonary edema, or pneumothorax. No evidence of acute osseous abnormality. IMPRESSION: Patchy airspace disease in the right mid lung suspicious for pneumonia. Recommend radiographic follow-up to resolution. Electronically Signed   By: Narda Rutherford M.D.   On: 02/04/2023 16:41      IMPRESSION AND PLAN:  Assessment and Plan: * Sepsis due to pneumonia (HCC) - Sepsis manifested by fever, tachycardia and tachypnea with leukocytosis. - This  like secondary to community-acquired pneumonia that is likely bacterial right-sided midlung zone pneumonia. - The patient will be admitted to a medical telemetry bed. - Will continue antibiotic therapy with IV Rocephin and Zithromax. - Mucolytic therapy be provided as well as duo nebs q.i.d. and q.4 hours p.r.n. - We will follow blood cultures.   Dyslipidemia - We will continue statin therapy.  GERD without esophagitis - We will continue PPI therapy.  History of CVA (  cerebrovascular accident) - We will continue aspirin and Plavix as well as statin therapy.  Depression We will continue Wellbutrin XL  Ankylosing spondylitis of lumbosacral region [facility]) - His methotrexate will be continued.    DVT prophylaxis: Lovenox.  Advanced Care Planning:  Code Status: full code.  Family Communication:  The plan of care was discussed in details with the patient (and family). I answered all questions. The patient agreed to proceed with the above mentioned plan. Further management will depend upon hospital course. Disposition Plan: Back to previous home environment Consults called: none.  All the records are reviewed and case discussed with ED provider.  Status is: Inpatient  At the time of the admission, it appears that the appropriate admission status for this patient is inpatient.  This is judged to be reasonable and necessary in order to provide the required intensity of service to ensure the patient's safety given the presenting symptoms, physical exam findings and initial radiographic and laboratory data in the context of comorbid conditions.  The patient requires inpatient status due to high intensity of service, high risk of further deterioration and high frequency of surveillance required.  I certify that at the time of admission, it is my clinical judgment that the patient will require inpatient hospital care extending more than 2 midnights.                            Dispo: The patient  is from: Home              Anticipated d/c is to: Home              Patient currently is not medically stable to d/c.              Difficult to place patient: No  Hannah Beat M.D on 02/04/2023 at 8:29 PM  [facility]   From 7 PM-7 AM, contact night-coverage [email or web address]  CC: Primary care physician; [name], MD

## 2023-02-05 ENCOUNTER — Other Ambulatory Visit: Payer: Medicare Other

## 2023-02-05 ENCOUNTER — Ambulatory Visit: Payer: Medicare Other | Admitting: Hematology and Oncology

## 2023-02-05 ENCOUNTER — Inpatient Hospital Stay: Payer: Medicare Other

## 2023-02-05 ENCOUNTER — Inpatient Hospital Stay: Payer: Medicare Other | Admitting: Hematology and Oncology

## 2023-02-05 ENCOUNTER — Other Ambulatory Visit: Payer: Self-pay | Admitting: Hematology and Oncology

## 2023-02-05 DIAGNOSIS — A419 Sepsis, unspecified organism: Secondary | ICD-10-CM | POA: Diagnosis not present

## 2023-02-05 DIAGNOSIS — D500 Iron deficiency anemia secondary to blood loss (chronic): Secondary | ICD-10-CM

## 2023-02-05 DIAGNOSIS — J189 Pneumonia, unspecified organism: Secondary | ICD-10-CM | POA: Diagnosis not present

## 2023-02-05 LAB — EXPECTORATED SPUTUM ASSESSMENT W GRAM STAIN, RFLX TO RESP C

## 2023-02-05 LAB — CBC
HCT: 29.9 % — ABNORMAL LOW (ref 39.0–52.0)
Hemoglobin: 9.9 g/dL — ABNORMAL LOW (ref 13.0–17.0)
MCH: 33 pg (ref 26.0–34.0)
MCHC: 33.1 g/dL (ref 30.0–36.0)
MCV: 99.7 fL (ref 80.0–100.0)
Platelets: 207 10*3/uL (ref 150–400)
RBC: 3 MIL/uL — ABNORMAL LOW (ref 4.22–5.81)
RDW: 13.2 % (ref 11.5–15.5)
WBC: 12.7 10*3/uL — ABNORMAL HIGH (ref 4.0–10.5)
nRBC: 0 % (ref 0.0–0.2)

## 2023-02-05 LAB — BASIC METABOLIC PANEL
Anion gap: 8 (ref 5–15)
BUN: 13 mg/dL (ref 8–23)
CO2: 24 mmol/L (ref 22–32)
Calcium: 8 mg/dL — ABNORMAL LOW (ref 8.9–10.3)
Chloride: 101 mmol/L (ref 98–111)
Creatinine, Ser: 0.67 mg/dL (ref 0.61–1.24)
GFR, Estimated: >60 mL/min (ref >60)
Glucose, Bld: 94 mg/dL (ref 70–99)
Potassium: 3.1 mmol/L — ABNORMAL LOW (ref 3.5–5.1)
Sodium: 133 mmol/L — ABNORMAL LOW (ref 135–145)

## 2023-02-05 LAB — RESPIRATORY PANEL BY PCR

## 2023-02-05 LAB — PROTIME-INR
INR: 1.2 (ref 0.8–1.2)
Prothrombin Time: 15.7 s — ABNORMAL HIGH (ref 11.4–15.2)

## 2023-02-05 LAB — PROCALCITONIN: Procalcitonin: 0.44 ng/mL

## 2023-02-05 LAB — CORTISOL-AM, BLOOD: Cortisol - AM: 7.9 ug/dL (ref 6.7–22.6)

## 2023-02-05 MED ORDER — SODIUM CHLORIDE 0.9% FLUSH
3.0000 mL | Freq: Two times a day (BID) | INTRAVENOUS | Status: DC
  Administered 2023-02-05 – 2023-02-06 (×3): 3 mL via INTRAVENOUS

## 2023-02-05 MED ORDER — SODIUM CHLORIDE 0.9 % IV SOLN: INTRAVENOUS | Status: AC

## 2023-02-05 NOTE — Progress Notes (Signed)
PROGRESS NOTE    SADIKI LOLLIE  BJY:[id number] DOB: Jan 27, 1935 DOA: 02/04/2023 PCP: [name], MD   Brief Narrative:  Steven [name] is a 88 y.o. male with medical history significant for ankylosing spondylitis, anxiety, osteoarthritis, depression, dyslipidemia and SBO, who presented to the emergency room with acute onset of recurrent falls.  He denied any loss of consciousness.  He stated that he fell and could not get up today from generalized weakness.  He experienced dizziness and lightheadedness before that but has been having chronic dizziness.  No focal muscle weakness or paresthesias.  No dysarthria or dysphagia.  No tinnitus or vertigo.  He admitted to nausea and vomiting this a.m. without diarrhea.  He admitted to chills but did not have fever until he came to the ER.  No dysuria, oliguria or hematuria or flank pain.  He has been having cough that is mainly dry with associated chest congestion without significant dyspnea or wheezing.   ED Course: When he came to the ER, temperature was one 1.4 with heart rate of 95 and respiratory to 22, pulse currently 90% on room air and BP 117/62.  Labs revealed borderline potassium of 3.5 and albumin 3.2, blood glucose of 136 with otherwise unremarkable CMP.  Lactic acid with 2.4 and later 2.5.  CBC showed leukocytosis 15.4 with neutrophilia and anemia.  Respiratory panel came back negative.  UA was unremarkable.  Blood cultures were sent. EKG as reviewed by me : None Imaging: Two-view chest x-ray showed patchy airspace disease in the right midlung suspicious for pneumonia.  Assessment & Plan:   Principal Problem:   Sepsis due to pneumonia [facility]) Active Problems:   Dyslipidemia   Ankylosing spondylitis of lumbosacral region [facility])   Depression   History of CVA (cerebrovascular accident)   GERD without esophagitis   Community acquired pneumonia of right lung  Sepsis secondary to community-acquired pneumonia, POA: Met sepsis criteria based on  fever, tachypnea and leukocytosis.  Evidence of right middle lobe infiltrate/pneumonia on chest x-ray.  He is not hypoxic.  Procalcitonin 0.44.  Sepsis parameters improving.  Continue Rocephin and Zithromax.  Check respiratory viral panel, urine antigen for Legionella and streptococci as well as sputum culture.  Hypokalemia: Replenished.  Recurrent falls: Consulted PT OT.  Anemia of chronic disease: Baseline hemoglobin appears to be around 10, he is currently at baseline.  Dyslipidemia: Statin.  GERD: PPI.  History of CVA: Continue aspirin and Plavix.  History of depression: Continue Wellbutrin XL.  History of ankylosing spondylitis of lumbosacral region: Methotrexate.  DVT prophylaxis: enoxaparin (LOVENOX) injection 40 mg Start: 02/04/23 2200   Code Status: Full Code  Family Communication:  None present at bedside.  Plan of care discussed with patient in length and he/she verbalized understanding and agreed with it.  Status is: Inpatient Remains inpatient appropriate because: Still feels weak, needs to be seen by PT OT.   Estimated body mass index is 19.86 kg/m as calculated from the following:   Height as of this encounter: 5\' 6"  (1.676 m).   Weight as of this encounter: 55.8 kg.    Nutritional Assessment: Body mass index is 19.86 kg/m.Marland Kitchen Seen by dietician.  I agree with the assessment and plan as outlined below: Nutrition Status:        . Skin Assessment: I have examined the patient's skin and I agree with the wound assessment as performed by the wound care RN as outlined below:    Consultants:  None  Procedures:  None  Antimicrobials:  Anti-infectives (From admission, onward)    Start     Dose/Rate Route Frequency Ordered Stop   02/04/23 1700  cefTRIAXone (ROCEPHIN) 2 g in sodium chloride 0.9 % 100 mL IVPB        2 g 200 mL/hr over 30 Minutes Intravenous Every 24 hours 02/04/23 1638 02/09/23 1659   02/04/23 1700  azithromycin (ZITHROMAX) 500 mg in sodium  chloride 0.9 % 250 mL IVPB        500 mg 250 mL/hr over 60 Minutes Intravenous Every 24 hours 02/04/23 1638 02/09/23 1659   02/04/23 1630  cefTRIAXone (ROCEPHIN) 1 g in sodium chloride 0.9 % 100 mL IVPB  Status:  Discontinued        1 g 200 mL/hr over 30 Minutes Intravenous  Once 02/04/23 1616 02/04/23 1639   02/04/23 1630  azithromycin (ZITHROMAX) tablet 500 mg  Status:  Discontinued        500 mg Oral  Once 02/04/23 1616 02/04/23 1639         Subjective: Patient seen and examined.  He says that he feels better than yesterday, slightly stronger, no shortness of breath or chest pain but he does not think that he is back to his baseline as far as his strength close and he is concerned about recurrent falls that he had at home.  Objective: Vitals:   02/05/23 0009 02/05/23 0442 02/05/23 0740 02/05/23 0758  BP: (!) 110/58 (!) 115/57 119/62   Pulse: 87 86 81   Resp: 16 15 17    Temp: 99.9 F (37.7 C) 99.5 F (37.5 C) 97.9 F (36.6 C)   TempSrc: Oral Oral Oral   SpO2: 96% 95% 96% 93%  Weight:      Height:        Intake/Output Summary (Last 24 hours) at 02/05/2023 0815 Last data filed at 02/05/2023 0742 Gross per 24 hour  Intake 2350.41 ml  Output 1350 ml  Net 1000.41 ml   Filed Weights   02/04/23 1756  Weight: 55.8 kg    Examination:  General exam: Appears calm and comfortable  Respiratory system: Rhonchi right middle lobe. Respiratory effort normal. Cardiovascular system: S1 & S2 heard, RRR. No JVD, murmurs, rubs, gallops or clicks. No pedal edema. Gastrointestinal system: Abdomen is nondistended, soft and nontender. No organomegaly or masses felt. Normal bowel sounds heard. Central nervous system: Alert and oriented. No focal neurological deficits. Extremities: Symmetric 5 x 5 power. Skin: No rashes, lesions or ulcers Psychiatry: Judgement and insight appear normal. Mood & affect appropriate.    Data Reviewed: I have personally reviewed following labs and imaging  studies  CBC: Recent Labs  Lab 02/04/23 1302 02/05/23 0540  WBC 15.4* 12.7*  NEUTROABS 13.9*  --   HGB 12.1* 9.9*  HCT 36.5* 29.9*  MCV 98.6 99.7  PLT 295 207   Basic Metabolic Panel: Recent Labs  Lab 02/04/23 1302 02/05/23 0540  NA 137 133*  K 3.5 3.1*  CL 104 101  CO2 24 24  GLUCOSE 136* 94  BUN 16 13  CREATININE 0.71 0.67  CALCIUM 8.1* 8.0*   GFR: Estimated Creatinine Clearance: 52.3 mL/min (by C-G formula based on SCr of 0.67 mg/dL). Liver Function Tests: Recent Labs  Lab 02/04/23 1302  AST 15  ALT 12  ALKPHOS 68  BILITOT 0.7  PROT 7.1  ALBUMIN 3.2*   Recent Labs  Lab 02/04/23 1302  LIPASE 24   No results for input(s): "AMMONIA" in the last 168 hours. Coagulation Profile:  Recent Labs  Lab 02/05/23 0540  INR 1.2   Cardiac Enzymes: No results for input(s): "CKTOTAL", "CKMB", "CKMBINDEX", "TROPONINI" in the last 168 hours. BNP (last 3 results) No results for input(s): "PROBNP" in the last 8760 hours. HbA1C: No results for input(s): "HGBA1C" in the last 72 hours. CBG: No results for input(s): "GLUCAP" in the last 168 hours. Lipid Profile: No results for input(s): "CHOL", "HDL", "LDLCALC", "TRIG", "CHOLHDL", "LDLDIRECT" in the last 72 hours. Thyroid Function Tests: No results for input(s): "TSH", "T4TOTAL", "FREET4", "T3FREE", "THYROIDAB" in the last 72 hours. Anemia Panel: No results for input(s): "VITAMINB12", "FOLATE", "FERRITIN", "TIBC", "IRON", "RETICCTPCT" in the last 72 hours. Sepsis Labs: Recent Labs  Lab 02/04/23 1321 02/04/23 1537 02/05/23 0540  PROCALCITON  --   --  0.44  LATICACIDVEN 2.4* 2.5*  --     Recent Results (from the past 240 hour(s))  Blood culture (routine x 2)     Status: None (Preliminary result)   Collection Time: 02/04/23  1:50 PM   Specimen: BLOOD  Result Value Ref Range Status   Specimen Description   Final    BLOOD SITE NOT SPECIFIED Performed at [facility], [address],  [place], [place] [address]    Special Requests   Final    BOTTLES DRAWN AEROBIC AND ANAEROBIC Blood Culture results may not be optimal due to an excessive volume of blood received in culture bottles Performed at [facility], [address] [place], [place] [address]    Culture   Final    NO GROWTH < 12 HOURS Performed at [facility], [address] [place], [place] [address]    Report Status PENDING  Incomplete  Blood culture (routine x 2)     Status: None (Preliminary result)   Collection Time: 02/04/23  1:50 PM   Specimen: BLOOD  Result Value Ref Range Status   Specimen Description   Final    BLOOD SITE NOT SPECIFIED Performed at [facility], [address], [place], [place] [address]    Special Requests   Final    BOTTLES DRAWN AEROBIC AND ANAEROBIC Blood Culture adequate volume Performed at [facility], [address] [place], [place] [address]    Culture   Final    NO GROWTH < 12 HOURS Performed at [facility], [address], [place], [place] [address]    Report Status PENDING  Incomplete  Resp panel by RT-PCR (RSV, Flu A&B, Covid) Anterior Nasal Swab     Status: None   Collection Time: 02/04/23  1:52 PM   Specimen: Anterior Nasal Swab  Result Value Ref Range Status   SARS Coronavirus 2 by RT PCR NEGATIVE NEGATIVE Final    Comment: (NOTE) SARS-CoV-2 target nucleic acids are NOT DETECTED.  The SARS-CoV-2 RNA is generally detectable in upper respiratory specimens during the acute phase of infection. The lowest concentration of SARS-CoV-2 viral copies this assay can detect is 138 copies/mL. A negative result does not preclude SARS-Cov-2 infection and should not be used as the sole basis for treatment or other patient management decisions. A negative result may occur with  improper specimen collection/handling, submission of specimen other than nasopharyngeal swab, presence of viral mutation(s) within  the areas targeted by this assay, and inadequate number of viral copies(<138 copies/mL). A negative result must be combined with clinical observations, patient history, and epidemiological information. The expected result is Negative.  Fact Sheet for Patients:  BloggerCourse.com  Fact Sheet for Healthcare Providers:  SeriousBroker.it  This test is no t yet approved or cleared by the [place] FDA and  has been authorized for detection and/or diagnosis of SARS-CoV-2 by FDA under an Emergency Use Authorization (EUA). This EUA will remain  in effect (meaning this test can be used) for the duration of the COVID-19 declaration under Section 564(b)(1) of the Act, 21 U.S.C.section 360bbb-3(b)(1), unless the authorization is terminated  or revoked sooner.       Influenza A by PCR NEGATIVE NEGATIVE Final   Influenza B by PCR NEGATIVE NEGATIVE Final    Comment: (NOTE) The Xpert Xpress SARS-CoV-2/FLU/RSV plus assay is intended as an aid in the diagnosis of influenza from Nasopharyngeal swab specimens and should not be used as a sole basis for treatment. Nasal washings and aspirates are unacceptable for Xpert Xpress SARS-CoV-2/FLU/RSV testing.  Fact Sheet for Patients: BloggerCourse.com  Fact Sheet for Healthcare Providers: SeriousBroker.it  This test is not yet approved or cleared by the [place] FDA and has been authorized for detection and/or diagnosis of SARS-CoV-2 by FDA under an Emergency Use Authorization (EUA). This EUA will remain in effect (meaning this test can be used) for the duration of the COVID-19 declaration under Section 564(b)(1) of the Act, 21 U.S.C. section 360bbb-3(b)(1), unless the authorization is terminated or revoked.     Resp Syncytial Virus by PCR NEGATIVE NEGATIVE Final    Comment: (NOTE) Fact Sheet for  Patients: BloggerCourse.com  Fact Sheet for Healthcare Providers: SeriousBroker.it  This test is not yet approved or cleared by the [place] FDA and has been authorized for detection and/or diagnosis of SARS-CoV-2 by FDA under an Emergency Use Authorization (EUA). This EUA will remain in effect (meaning this test can be used) for the duration of the COVID-19 declaration under Section 564(b)(1) of the Act, 21 U.S.C. section 360bbb-3(b)(1), unless the authorization is terminated or revoked.  Performed at [facility], [address], [place], [place] [address]      Radiology Studies: DG Chest 2 View  Result Date: 02/04/2023 CLINICAL DATA:  Cough and fever.  Congestion. EXAM: CHEST - 2 VIEW COMPARISON:  Radiograph 07/11/2022, CT 08/14/2023 FINDINGS: Patchy airspace disease in the right mid lung suspicious for pneumonia. The heart is normal in size. Aortic atherosclerosis. No pleural effusion, pulmonary edema, or pneumothorax. No evidence of acute osseous abnormality. IMPRESSION: Patchy airspace disease in the right mid lung suspicious for pneumonia. Recommend radiographic follow-up to resolution. Electronically Signed   By: Narda Rutherford M.D.   On: 02/04/2023 16:41    Scheduled Meds:  aspirin EC  325 mg Oral Daily   buPROPion  300 mg Oral Daily   calcium carbonate  0.5 tablet Oral QAC breakfast   cholecalciferol  1,000 Units Oral Daily   clopidogrel  75 mg Oral Daily   enoxaparin (LOVENOX) injection  40 mg Subcutaneous Q24H   ferrous sulfate  325 mg Oral Q breakfast   folic acid  1 mg Oral Daily   guaiFENesin  600 mg Oral BID   ipratropium-albuterol  3 mL Nebulization QID   mirabegron ER  25 mg Oral Daily   multivitamin  1 tablet Oral Daily   pantoprazole  40 mg Oral Daily   rosuvastatin  20 mg Oral QHS   Continuous Infusions:  azithromycin Stopped (02/04/23 2209)   cefTRIAXone (ROCEPHIN)  IV Stopped  (02/04/23 1825)   lactated ringers 150 mL/hr (02/05/23 0735)     LOS: 1 day  Hughie Closs, MD [facility]  02/05/2023, 8:15 AM   *Please note that this is a verbal dictation therefore any spelling or grammatical errors are due to the "Dragon Medical One" system interpretation.  Please page via Amion and do not message via secure chat for urgent patient care matters. Secure chat can be used for non urgent patient care matters.  How to contact the [facility] Attending or Consulting provider 7A - 7P or covering provider during after hours 7P -7A, for this patient?  Check the care team in [facility] and look for a) attending/consulting TRH provider listed and b) the [facility] team listed. Page or secure chat 7A-7P. Log into [email or web address] and use [place]'s universal password to access. If you do not have the password, please contact the hospital operator. Locate the [facility] provider you are looking for under [facility] and page to a number that you can be directly reached. If you still have difficulty reaching the provider, please page the [facility] (Director on Call) for the Hospitalists listed on amion for assistance.

## 2023-02-05 NOTE — Progress Notes (Signed)
Mobility Specialist - Progress Note  Pre-mobility: 86 bpm HR, 97% SpO2 During mobility: 91 bpm HR,  Post-mobility: 86 bpm HR, 98% SPO2   02/05/23 1136  Mobility  Activity Ambulated with assistance in hallway  Level of Assistance Minimal assist, patient does 75% or more  Assistive Device Front wheel walker  Distance Ambulated (ft) 250 ft  Range of Motion/Exercises Active Assistive  Activity Response Tolerated fair  Mobility Referral Yes  $Mobility charge 1 Mobility  Mobility Specialist Start Time (ACUTE ONLY) 1115  Mobility Specialist Stop Time (ACUTE ONLY) 1136  Mobility Specialist Time Calculation (min) (ACUTE ONLY) 21 min   Pt was found in bed and agreeable to ambulate. Pt was min-A for bed mobility and STS. Was a little unsteady sitting EOB but after a couple minutes able to become steady. Pt was CG for ambulation and once standing had urine accident on floor. After assisting pt and cleaning floor able to ambulate in hallway. Pt able to follow cues to keep feet within RW when turning. At EOS returned to recliner chair with all needs met. Call bell in reach and chair alarm on.  Billey Chang Mobility Specialist

## 2023-02-05 NOTE — TOC Initial Note (Signed)
Transition of Care [facility]) - Initial/Assessment Note    Patient Details  Name: Steven [name] MRN: [id number] Date of Birth: 07-04-1934  Transition of Care [facility]) CM/SW Contact:    Lanier Clam, RN Phone Number: 02/05/2023, 4:05 PM  Clinical Narrative: Monitor for d/c plans.                  Expected Discharge Plan: Home/Self Care Barriers to Discharge: Continued Medical Work up   Patient Goals and CMS Choice Patient states their goals for this hospitalization and ongoing recovery are:: Home CMS Medicare.gov Compare Post Acute Care list provided to:: Patient Choice offered to / list presented to : Patient [place] ownership interest in [facility].provided to:: Patient    Expected Discharge Plan and Services   Discharge Planning Services: CM Consult   Living arrangements for the past 2 months: Single Family Home                                      Prior Living Arrangements/Services Living arrangements for the past 2 months: Single Family Home Lives with:: Spouse Patient language and need for interpreter reviewed:: Yes Do you feel safe going back to the place where you live?: Yes      Need for Family Participation in Patient Care: Yes (Comment) Care giver support system in place?: Yes (comment)   Criminal Activity/Legal Involvement Pertinent to Current Situation/Hospitalization: No - Comment as needed  Activities of Daily Living   ADL Screening (condition at time of admission) Independently performs ADLs?: Yes (appropriate for developmental age) Is the patient deaf or have difficulty hearing?: Yes Does the patient have difficulty seeing, even when wearing glasses/contacts?: No Does the patient have difficulty concentrating, remembering, or making decisions?: Yes  Permission Sought/Granted Permission sought to share information with : Case Manager Permission granted to share information with : Yes, Verbal Permission Granted  Share Information  with NAME: Case manager           Emotional Assessment Appearance:: Appears stated age Attitude/Demeanor/Rapport: Gracious Affect (typically observed): Accepting Orientation: : Oriented to Self, Oriented to Place, Oriented to  Time, Oriented to Situation Alcohol / Substance Use: Not Applicable Psych Involvement: No (comment)  Admission diagnosis:  Febrile illness, acute [R50.9] Sepsis due to pneumonia (HCC) [J18.9, A41.9] Community acquired pneumonia of right lung, unspecified part of lung [J18.9] Patient Active Problem List   Diagnosis Date Noted   Sepsis due to pneumonia (HCC) 02/04/2023   Dyslipidemia 02/04/2023   GERD without esophagitis 02/04/2023   Community acquired pneumonia of right lung 02/04/2023   Gastroenteritis due to norovirus 07/11/2022   History of CVA (cerebrovascular accident) 07/11/2022   Iron deficiency anemia due to chronic blood loss 09/25/2021   Elevated CK 06/05/2021   Diarrhea 06/05/2021   Generalized weakness 06/04/2021   Dehydration 06/03/2021   Alcohol use 06/03/2021   CVA (cerebral vascular accident) (HCC) 03/28/2021   Stenosis of left carotid artery    TIA (transient ischemic attack) 03/27/2021   Small bowel obstruction due to adhesions (HCC) 02/29/2020   Hypokalemia    Hypomagnesemia    Ankylosing spondylitis of lumbosacral region [facility]    Depression    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 [place] [address] Phone: [phone] Fax: [phone]     Social Determinants of Health (SDOH) Social History: SDOH Screenings   Food Insecurity: No Food Insecurity (02/04/2023)  Housing: Low Risk  (02/04/2023)  Transportation Needs: No Transportation Needs (02/04/2023)  Utilities: Not At Risk  (02/04/2023)  Financial Resource Strain: Low Risk  (03/09/2019)  Physical Activity: Insufficiently Active (03/09/2019)  Social Connections: Unknown (03/09/2019)  Stress: No Stress Concern Present (03/09/2019)  Tobacco Use: Medium Risk (02/04/2023)   SDOH Interventions:     Readmission Risk Interventions     No data to display

## 2023-02-05 NOTE — Evaluation (Signed)
Physical Therapy Evaluation Patient Details Name: Steven [name] MRN: [id number] DOB: 02-May-1934 Today's Date: 02/05/2023  History of Present Illness  88 y.o. male presents to therapy s/p hospital admission on 02/04/2023 due to falls and sepsis secondary to PNA. Pt  medical history significant for ankylosing spondylitis on methotrexate, TIA, CAS s/p left TCAR, iron deficiency anemia, and HLD.  Clinical Impression      Pt admitted with above diagnosis.  Pt currently with functional limitations due to the deficits listed below (see PT Problem List). Pt seated in recliner when PT arrived. Spouse present for evaluation. Pt indicated frequent falls, 4 in the past 6 months and syncope like episodes prior to hospital admission. Pt states participating with OPPT  prior with a focus on balance and more normalized gait pattern, pt will require continued therapy services following hospital d/c HH vs OPPT pending pt progress. Pt required S for sit to stand  from recliner with min cues, gait tasks in hallway with RW and CGA, noted narrow BOS with limited B LE foot clearance L>R and L foot passing in stance phase with step to on R and slow cadence. Pt denies pain and SOB, indicates no dizziness however pt hypotensive, please see below. Pt left seated in recliner, all needs in place and respiratory therapist present.  Pt will benefit from acute skilled PT to increase their independence and safety with mobility to allow discharge.   Bp seated at rest in recliner 102/61 (83 PR) Bp immediate standing 80/45 (91 PR) Bp s/p 3 min standing 83/44 (89 PR) Bp s/p gait tasks and returned to sitting in recliner 106/42 (85 PR) 100% O2 saturation on RA     If plan is discharge home, recommend the following: A little help with walking and/or transfers;A little help with bathing/dressing/bathroom;Assistance with cooking/housework;Assist for transportation;Help with stairs or ramp for entrance   Can travel by private vehicle         Equipment Recommendations None recommended by PT  Recommendations for Other Services       Functional Status Assessment Patient has had a recent decline in their functional status and demonstrates the ability to make significant improvements in function in a reasonable and predictable amount of time.     Precautions / Restrictions Precautions Precautions: Fall Restrictions Weight Bearing Restrictions: No      Mobility  Bed Mobility               General bed mobility comments: pt seated in recliner when PT arrived.    Transfers Overall transfer level: Needs assistance Equipment used: Rolling walker (2 wheels) Transfers: Sit to/from Stand Sit to Stand: Supervision           General transfer comment: min cues for safety and noted postural sway with initial standing no reports of dizziness    Ambulation/Gait Ambulation/Gait assistance: Contact guard assist Gait Distance (Feet): 150 Feet Assistive device: Rolling walker (2 wheels) Gait Pattern/deviations: Step-to pattern, Decreased dorsiflexion - left, Narrow base of support Gait velocity: decreased     General Gait Details: pt amb with narrow BOS with L foot passing in stance phase, short R stride length, minimal R foot clearance and DF defcits noted with flexed posture  Stairs            Wheelchair Mobility     Tilt Bed    Modified Rankin (Stroke Patients Only)       Balance Overall balance assessment: Needs assistance, History of Falls Sitting-balance support: Feet supported Sitting  balance-[name]: Good     Standing balance support: Bilateral upper extremity supported, During functional activity, Reliant on assistive device for balance Standing balance-[name]: Fair Standing balance comment: static standing no UE support with postural sway noted                             Pertinent Vitals/Pain Pain Assessment Pain Assessment: No/denies pain    Home Living  Family/patient expects to be discharged to:: Private residence Living Arrangements: Spouse/significant other Available Help at Discharge: Family;Available 24 hours/day Type of Home: House Home Access: Stairs to enter Entrance Stairs-Rails: Can reach both Entrance Stairs-Number of Steps: 5 Alternate Level Stairs-Number of Steps: 13 Home Layout: Two level;Able to live on main level with bedroom/bathroom Home Equipment: Rolling Walker (2 wheels);Cane - single point;Shower seat;Grab bars - tub/shower Additional Comments: pt reports particpating with OPPT services priro to hospital admission    Prior Function Prior Level of Function : Independent/Modified Independent;Driving             Mobility Comments: IND with use of RW at night time secondary to hx of falls, mod I for ADLs, self care tasks and IADLs. ADLs Comments: pt reports requiring occational assist donning socks     Extremity/Trunk Assessment        Lower Extremity Assessment Lower Extremity Assessment: Generalized weakness (noted limited R ankle DF)    Cervical / Trunk Assessment Cervical / Trunk Assessment: Other exceptions (ankylosing spondylitis)  Communication   Communication Communication: Hearing impairment (B hearing aids)  Cognition Arousal: Alert Behavior During Therapy: WFL for tasks assessed/performed Overall Cognitive Status: Within Functional Limits for tasks assessed                                          General Comments      Exercises     Assessment/Plan    PT Assessment Patient needs continued PT services  PT Problem List Decreased strength;Decreased range of motion;Decreased activity tolerance;Decreased balance;Decreased mobility       PT Treatment Interventions DME instruction;Gait training;Stair training;Functional mobility training;Therapeutic activities;Therapeutic exercise;Balance training;Neuromuscular re-education;Patient/family education    PT Goals (Current  goals can be found in the Care Plan section)  Acute Rehab PT Goals Patient Stated Goal: to be able to go home and no more falls PT Goal Formulation: With patient Time For Goal Achievement: 02/19/23 Potential to Achieve Goals: Good    Frequency Min 1X/week     Co-evaluation               AM-PAC PT "6 Clicks" Mobility  Outcome Measure Help needed turning from your back to your side while in a flat bed without using bedrails?: A Little Help needed moving from lying on your back to sitting on the side of a flat bed without using bedrails?: A Little Help needed moving to and from a bed to a chair (including a wheelchair)?: A Little Help needed standing up from a chair using your arms (e.g., wheelchair or bedside chair)?: A Little Help needed to walk in hospital room?: A Little Help needed climbing 3-5 steps with a railing? : A Lot 6 Click Score: 17    End of Session Equipment Utilized During Treatment: Gait belt Activity Tolerance: Patient tolerated treatment well Patient left: in chair;with call bell/phone within reach;with chair alarm set;Other (comment);with family/visitor present (repiratory  therapist) Nurse Communication: Mobility status;Other (comment) (Bp findings) PT Visit Diagnosis: Unsteadiness on feet (R26.81);Other abnormalities of gait and mobility (R26.89);Repeated falls (R29.6);Muscle weakness (generalized) (M62.81);Difficulty in walking, not elsewhere classified (R26.2)    Time: 1324-4010 PT Time Calculation (min) (ACUTE ONLY): 28 min   Charges:   PT Evaluation $PT Eval Low Complexity: 1 Low PT Treatments $Gait Training: 8-22 mins PT General Charges $$ ACUTE PT VISIT: 1 Visit         Johnny Bridge, PT Acute Rehab   Steven [name] 02/05/2023, 4:57 PM

## 2023-02-05 NOTE — Progress Notes (Signed)
Labeled sputum specimen cup left at the bedside with instructions given to pt.

## 2023-02-05 NOTE — Plan of Care (Signed)
  Problem: Respiratory: Goal: Ability to maintain adequate ventilation will improve Outcome: Progressing   Problem: Education: Goal: Knowledge of General Education information will improve Description: Including pain rating scale, medication(s)/side effects and non-pharmacologic comfort measures Outcome: Progressing   Problem: Activity: Goal: Risk for activity intolerance will decrease Outcome: Progressing   Problem: Nutrition: Goal: Adequate nutrition will be maintained Outcome: Progressing   Problem: Coping: Goal: Level of anxiety will decrease Outcome: Progressing   Problem: Elimination: Goal: Will not experience complications related to urinary retention Outcome: Progressing   Problem: Pain Management: Goal: General experience of comfort will improve Outcome: Progressing   Problem: Safety: Goal: Ability to remain free from injury will improve Outcome: Progressing   Problem: Skin Integrity: Goal: Risk for impaired skin integrity will decrease Outcome: Progressing

## 2023-02-05 NOTE — Progress Notes (Deleted)
[facility] [phone]   [phone]  PROGRESS NOTE  Patient Care Team: [name], MD as PCP - General (Internal Medicine) Jaci Standard, MD as Consulting Physician (Hematology and Oncology)  Hematological/Oncological History #Normocytic Anemia #Iron Deficiency Anemia 1) 01/01/2017: WBC 5.1, Hgb 11.0, Plt 254, MCV 91.6 2) 12/07/2017: WBC 8.0, Hgb 11.9, Plt 352, MCV 91.4. Iron 76, TIBC 359, Sat 21%, Ferritin 31 3) 03/09/2019: WBC 14.3, Hgb 11.1, Plt 374, MCV 88.8 4) 05/22/2019: WBC 5.5, Hgb 10.7, Plt 311, MCV 83.7. Routine PCP visit. 5) 06/14/2019: establish care with Dr. Leonides Schanz. WBC 5.0, Hgb 10.3, MCV 84.9, Plt 320 6) 09/14/2019: WBC 6.2, Hgb 10.9, MCV 91.2, Plt 209 7)  11/01/2019: WBC 9.2, Hgb 12.6, MVC 97.2, Plt 299 8) 11/05/2019: patient admitted with SBO, instructed to stop PO iron indefinitely.  9) 02/28/2020: admitted with SBO, Hgb drop to 9.6 while inpatient 10) 05/17/2020: Hgb 11.7, increased without intervention.  11) 09/05/2020: WBC 6.6, Hgb 10.9, MCV 94.6, Plt 259 12) 6/6-6/17/2022: IV feraheme 510 mg x 2 doses  13) 6/21-6/30/2023: IV feraheme 510 mg x 2 doses  14) 02/20/2022: WBC 5.8, Hgb 11.5, MCV 98.5, Plt 262  Interval History:  Steven [name] 88 y.o. male with medical history significant for iron deficiency anemia who presents for a follow up visit. The patient's last visit was on 02/20/2022. In the interim since the last visit Mr. Ruane has been at his baseline level of health.   On exam today Mr. Shed notes that everything has been good other than "the worst episode of my life".  He had an episode where he had norovirus and a "knock him right out".  He ended up in the hospital for 3 days and took him 2 weeks to fully recover.  He had awful nausea,, diarrhea and was severely dehydrated.  He reports that he has begun to rebound from this recent illness.  He notes his energy right now is about a 7.5 out of 10.  He reports that he has been  doing his best to try to walk is much as he can to try to build up his strength and energy.  He is also eating quite well but is concerned that his legs get weak when he exercises too much.  He continues take his iron pill without difficulty, without any constipation, nausea. He denies having fevers, chills, sweats, nausea, vomiting or diarrhea.  Otherwise a full 10 point ROS is listed below.   MEDICAL HISTORY:  Past Medical History:  Diagnosis Date   Allergy    enviromental   Anemia 2017   Anxiety    Arthritis    ankylosing spodilitis   Blood transfusion without reported diagnosis    during surgery   Cataract    Depression    Dyspnea    with exertion - had Echo done 09/29/16   History of kidney stones    Hyperlipidemia    Intestinal obstruction (HCC)    Pneumonia    as a child    SURGICAL HISTORY: Past Surgical History:  Procedure Laterality Date   ABDOMINAL SURGERY     had abcess   APPENDECTOMY     CHOLECYSTECTOMY     with lysis of adhesions   COLONOSCOPY     COLOSTOMY     COLOSTOMY REVERSAL     EYE SURGERY Left    scar tissue removed from cornea   EYE SURGERY Right    cataract surgery with lens implant  JOINT REPLACEMENT Right    hip  x 2 1999 and 2007   SHOULDER ARTHROSCOPY WITH ROTATOR CUFF REPAIR AND SUBACROMIAL DECOMPRESSION Left 03/02/2013   Procedure: LEFT SHOULDER ARTHROSCOPY WITH SUBACROMIAL DECOMPRESSION DISTAL CALVICLE RESECTION AND ROTATOR CUFF REPAIR ;  Surgeon: Senaida Lange, MD;  Location: [facility] OR;  Service: Orthopedics;  Laterality: Left;   TOTAL HIP REVISION Right 11/06/2016   Procedure: TOTAL HIP REVISION OF THE ACETABULAR COMPONENT;  Surgeon: Gean Birchwood, MD;  Location: [facility] OR;  Service: Orthopedics;  Laterality: Right;   TRANSCAROTID ARTERY REVASCULARIZATION  Left 03/31/2021   Procedure: TRANSCAROTID ARTERY REVASCULARIZATION;  Surgeon: Victorino Sparrow, MD;  Location: [facility] OR;  Service: Vascular;  Laterality: Left;   ULTRASOUND GUIDANCE FOR VASCULAR  ACCESS Right 03/31/2021   Procedure: ULTRASOUND GUIDANCE FOR VASCULAR ACCESS;  Surgeon: Victorino Sparrow, MD;  Location: [facility] OR;  Service: Vascular;  Laterality: Right;    SOCIAL HISTORY: Social History   Socioeconomic History   Marital status: Married    Spouse name: Not on file   Number of children: 2   Years of education: Not on file   Highest education level: Not on file  Occupational History   Occupation: retired  Tobacco Use   Smoking status: Former    Current packs/day: 0.00    Average packs/day: 1 pack/day for 9.0 years (9.0 ttl pk-yrs)    Types: Cigarettes    Start date: 04/13/1956    Quit date: 04/13/1965    Years since quitting: 57.8    Passive exposure: Never   Smokeless tobacco: Never  Vaping Use   Vaping status: Never Used  Substance and Sexual Activity   Alcohol use: Yes    Comment: 3 beers or glasses of wine a day--reports stopped ETOH 11/01/16    Drug use: No   Sexual activity: Never  Other Topics Concern   Not on file  Social History Narrative   Not on file   Social Determinants of Health   Financial Resource Strain: Low Risk  (03/09/2019)   Overall Financial Resource Strain (CARDIA)    Difficulty of Paying Living Expenses: Not hard at all  Food Insecurity: No Food Insecurity (02/04/2023)   Hunger Vital Sign    Worried About Running Out of Food in the Last Year: Never true    Ran Out of Food in the Last Year: Never true  Transportation Needs: No Transportation Needs (02/04/2023)   PRAPARE - Administrator, Civil Service (Medical): No    Lack of Transportation (Non-Medical): No  Physical Activity: Insufficiently Active (03/09/2019)   Exercise Vital Sign    Days of Exercise per Week: 5 days    Minutes of Exercise per Session: 10 min  Stress: No Stress Concern Present (03/09/2019)   [employer] of Occupational Health - Occupational Stress Questionnaire    Feeling of Stress : Not at all  Social Connections: Unknown (03/09/2019)    Social Connection and Isolation Panel [NHANES]    Frequency of Communication with Friends and Family: Patient declined    Frequency of Social Gatherings with Friends and Family: Patient declined    Attends Religious Services: Patient declined    Database administrator or Organizations: Patient declined    Attends Banker Meetings: Patient declined    Marital Status: Patient declined  Intimate Partner Violence: Not At Risk (02/04/2023)   Humiliation, Afraid, Rape, and Kick questionnaire    Fear of Current or Ex-Partner: No    Emotionally Abused: No  Physically Abused: No    Sexually Abused: No    FAMILY HISTORY: Family History  Problem Relation Age of Onset   Heart attack Mother    Diabetes Mother    Breast cancer Mother    Heart attack Maternal Grandfather    Pancreatic cancer Brother    Colon cancer Neg Hx    Esophageal cancer Neg Hx    Stomach cancer Neg Hx     ALLERGIES:  is allergic to gluten meal, indomethacin, and lactose intolerance (gi).  MEDICATIONS:  No current facility-administered medications for this visit.   No current outpatient medications on file.   Facility-Administered Medications Ordered in Other Visits  Medication Dose Route Frequency Provider Last Rate Last Admin   acetaminophen (TYLENOL) tablet 650 mg  650 mg Oral Q6H PRN Mansy, Jan A, MD       Or   acetaminophen (TYLENOL) suppository 650 mg  650 mg Rectal Q6H PRN Mansy, Vernetta Honey, MD       aspirin EC tablet 325 mg  325 mg Oral Daily Mansy, Jan A, MD       azithromycin (ZITHROMAX) 500 mg in sodium chloride 0.9 % 250 mL IVPB  500 mg Intravenous Q24H Mansy, Jan A, MD   Stopped at 02/04/23 2209   buPROPion (WELLBUTRIN XL) 24 hr tablet 300 mg  300 mg Oral Daily Mansy, Jan A, MD       calcium carbonate (OS-CAL - dosed in mg of elemental calcium) tablet 625 mg  0.5 tablet Oral QAC breakfast Mansy, Jan A, MD       cefTRIAXone (ROCEPHIN) 2 g in sodium chloride 0.9 % 100 mL IVPB  2 g Intravenous  Q24H Mansy, Jan A, MD   Stopped at 02/04/23 1825   chlorpheniramine-HYDROcodone (TUSSIONEX) 10-8 MG/5ML suspension 5 mL  5 mL Oral Q12H PRN Mansy, Jan A, MD       cholecalciferol (VITAMIN D3) 25 MCG (1000 UNIT) tablet 1,000 Units  1,000 Units Oral Daily Mansy, Jan A, MD       clopidogrel (PLAVIX) tablet 75 mg  75 mg Oral Daily Mansy, Jan A, MD   75 mg at 02/04/23 2109   enoxaparin (LOVENOX) injection 40 mg  40 mg Subcutaneous Q24H Mansy, Jan A, MD   40 mg at 02/04/23 2109   ferrous sulfate tablet 325 mg  325 mg Oral Q breakfast Mansy, Jan A, MD       folic acid (FOLVITE) tablet 1 mg  1 mg Oral Daily Mansy, Jan A, MD       guaiFENesin (MUCINEX) 12 hr tablet 600 mg  600 mg Oral BID Mansy, Jan A, MD   600 mg at 02/04/23 2110   ipratropium (ATROVENT) 0.06 % nasal spray 2 spray  2 spray Each Nare BID PRN Mansy, Jan A, MD       ipratropium-albuterol (DUONEB) 0.5-2.5 (3) MG/3ML nebulizer solution 3 mL  3 mL Nebulization QID Mansy, Jan A, MD   3 mL at 02/04/23 2212   lactated ringers infusion  150 mL/hr Intravenous Continuous Mansy, Jan A, MD 150 mL/hr at 02/05/23 0046 150 mL/hr at 02/05/23 0046   magnesium hydroxide (MILK OF MAGNESIA) suspension 30 mL  30 mL Oral Daily PRN Mansy, Jan A, MD       mirabegron ER [facility]) tablet 25 mg  25 mg Oral Daily Mansy, Jan A, MD   25 mg at 02/04/23 1854   multivitamin (PROSIGHT) tablet 1 tablet  1 tablet Oral Daily Mansy, Jan A,  MD       ondansetron (ZOFRAN) tablet 4 mg  4 mg Oral Q6H PRN Mansy, Jan A, MD       Or   ondansetron [facility]) injection 4 mg  4 mg Intravenous Q6H PRN Mansy, Jan A, MD       pantoprazole (PROTONIX) EC tablet 40 mg  40 mg Oral Daily Mansy, Jan A, MD       rosuvastatin (CRESTOR) tablet 20 mg  20 mg Oral QHS Mansy, Jan A, MD   20 mg at 02/04/23 2109   traZODone (DESYREL) tablet 25 mg  25 mg Oral QHS PRN Mansy, Jan A, MD   25 mg at 02/04/23 2109    REVIEW OF SYSTEMS:   Constitutional: ( - ) fevers, ( - )  chills , ( - ) night sweats Eyes:  ( - ) blurriness of vision, ( - ) double vision, ( - ) watery eyes Ears, nose, mouth, throat, and face: ( - ) mucositis, ( - ) sore throat Respiratory: ( - ) cough, ( - ) dyspnea, ( - ) wheezes Cardiovascular: ( - ) palpitation, ( - ) chest discomfort, ( - ) lower extremity swelling Gastrointestinal:  ( - ) nausea, ( - ) heartburn, ( - ) change in bowel habits Skin: ( - ) abnormal skin rashes Lymphatics: ( - ) new lymphadenopathy, ( - ) easy bruising Neurological: ( - ) numbness, ( - ) tingling, ( - ) new weaknesses Behavioral/Psych: ( - ) mood change, ( - ) new changes  All other systems were reviewed with the patient and are negative.  PHYSICAL EXAMINATION: ECOG PERFORMANCE STATUS: 1 - Symptomatic but completely ambulatory  There were no vitals filed for this visit.  There were no vitals filed for this visit.   GENERAL: well appearing elderly Caucasian male in NAD  SKIN: skin color, texture, turgor are normal, no rashes or significant lesions EYES: conjunctiva are pink and non-injected, sclera clear LUNGS: clear to auscultation and percussion with normal breathing effort HEART: regular rate & rhythm and no murmurs and no lower extremity edema ABDOMEN: soft, non-tender, non-distended, normal bowel sounds. No HSM appreciated. Musculoskeletal: no cyanosis of digits and no clubbing  PSYCH: alert & oriented x 3, fluent speech NEURO: no focal motor/sensory deficits  LABORATORY DATA:  I have reviewed the data as listed    Latest Ref Rng & Units 02/05/2023    5:40 AM 02/04/2023    1:02 PM 08/07/2022    8:44 AM  CBC  WBC 4.0 - 10.5 K/uL 12.7  15.4  6.8   Hemoglobin 13.0 - 17.0 g/dL 9.9  44.0  10.2   Hematocrit 39.0 - 52.0 % 29.9  36.5  36.5   Platelets 150 - 400 K/uL 207  295  314        Latest Ref Rng & Units 02/05/2023    5:40 AM 02/04/2023    1:02 PM 08/07/2022    8:44 AM  CMP  Glucose 70 - 99 mg/dL 94  725  366   BUN 8 - 23 mg/dL 13  16  13    Creatinine 0.61 - 1.24 mg/dL  4.40  3.47  4.25   Sodium 135 - 145 mmol/L 133  137  140   Potassium 3.5 - 5.1 mmol/L 3.1  3.5  4.2   Chloride 98 - 111 mmol/L 101  104  103   CO2 22 - 32 mmol/L 24  24  33   Calcium 8.9 - 10.3  mg/dL 8.0  8.1  9.5   Total Protein 6.5 - 8.1 g/dL  7.1  7.2   Total Bilirubin 0.3 - 1.2 mg/dL  0.7  0.6   Alkaline Phos 38 - 126 U/L  68  61   AST 15 - 41 U/L  15  16   ALT 0 - 44 U/L  12  13     No results found for: "MPROTEIN" Lab Results  Component Value Date   KPAFRELGTCHN 16.5 06/14/2019   LAMBDASER 14.3 06/14/2019   KAPLAMBRATIO 1.15 06/14/2019    RADIOGRAPHIC STUDIES: DG Chest 2 View  Result Date: 02/04/2023 CLINICAL DATA:  Cough and fever.  Congestion. EXAM: CHEST - 2 VIEW COMPARISON:  Radiograph 07/11/2022, CT 08/14/2023 FINDINGS: Patchy airspace disease in the right mid lung suspicious for pneumonia. The heart is normal in size. Aortic atherosclerosis. No pleural effusion, pulmonary edema, or pneumothorax. No evidence of acute osseous abnormality. IMPRESSION: Patchy airspace disease in the right mid lung suspicious for pneumonia. Recommend radiographic follow-up to resolution. Electronically Signed   By: Narda Rutherford M.D.   On: 02/04/2023 16:41   MR BRAIN WO CONTRAST  Result Date: 01/27/2023 Table formatting from the original result was not included. [facility] NEUROIMAGING REPORT STUDY DATE: 01/26/23 PATIENT NAME: LARNELL SITZMAN DOB: 09/25/1934 MRN: [id number] ORDERING CLINICIAN: Windell Norfolk, MD CLINICAL HISTORY: 88 y.o. year old male with: 1. Dizziness   EXAM: MR BRAIN WO CONTRAST TECHNIQUE: MRI of the brain with and without contrast was obtained utilizing multiplanar, multiecho pulse sequences. CONTRAST: Diagnostic Product Medications (last 72 hours)   None   COMPARISON: 03/28/21 MRI IMAGING SITE: [facility] [address] [place] [address] FINDINGS: No abnormal lesions are seen on diffusion-weighted views to  suggest acute ischemia. The cortical sulci, fissures and cisterns are mild biparietal and perisylvian atrophy.  Lateral, third and fourth ventricle are normal in size and appearance. No extra-axial fluid collections are seen. No evidence of mass effect or midline shift.  Mild periventricular and subcortical chronic small vessel ischemic disease.  No abnormal lesions are seen on post contrast views.  On sagittal views the posterior fossa, pituitary gland and corpus callosum are unremarkable. No evidence of intracranial hemorrhage on SWI views. The orbits and their contents, paranasal sinuses and calvarium are notable for bilateral lens extractions. Intracranial flow voids are present.   MRI brain (without) demonstrating: -Mild atrophy and mild chronic small vessel ischemic disease. -No acute findings. INTERPRETING PHYSICIAN: Suanne Marker, MD Certified in Neurology, Neurophysiology and Neuroimaging [facility] [address], [address] [place], [place] [address] [phone]   ASSESSMENT & PLAN ASHFORD MEDCALF 88 y.o. male with medical history significant for iron deficiency anemia who presents for a follow up visit.  After review of the labs and discussion with the patient the findings are most consistent with continued iron deficiency anemia.  GI evaluations: EGD 03/11/2017: normal stomach and esophagus. Mild duodenal stenosis Colonoscopy 11/17/2013: moderate diverticulosis  At this time there is concern for recurrent GI bleed.  His hemoglobin has trended downward from 11.4 on 06/03/2021 to 8.2 on 09/25/2021.  GI loss is the most likely etiology.  He has been evaluated by gastroenterology who noted that endoscopic intervention is not indicated at this time.  #Normocytic Anemia # Iron Deficiency Anemia of Unclear Etiology -- Today hemoglobin *** --previously patient has to stop his ferrous sulfate 325mg  PO daily due to recurrent SBOs, as recommended by his  surgical team.  --Patient  has restarted his ferrous sulfate 325 mg p.o. daily and is tolerating it well. --repeat Iron panel, ferritin, and reticulocyte panel today.  --will recommend IV iron to the patient if iron levels remain low  --Mr. Shamblin noted he had negative FOB cards and was evaluated by gastroenterology who declined to perform endoscopic intervention. --RTC 6 months   No orders of the defined types were placed in this encounter.  All questions were answered. The patient knows to call the clinic with any problems, questions or concerns.  A total of more than 30 minutes were spent on this encounter and over half of that time was spent on counseling and coordination of care as outlined above.   Ulysees Barns, MD Department of Hematology/Oncology [facility] Phone: [phone] Pager: [phone] Email: [email or web address]  02/05/2023 7:16 AM

## 2023-02-06 DIAGNOSIS — A419 Sepsis, unspecified organism: Secondary | ICD-10-CM | POA: Diagnosis not present

## 2023-02-06 DIAGNOSIS — J189 Pneumonia, unspecified organism: Secondary | ICD-10-CM | POA: Diagnosis not present

## 2023-02-06 LAB — BASIC METABOLIC PANEL
Anion gap: 7 (ref 5–15)
BUN: 10 mg/dL (ref 8–23)
CO2: 25 mmol/L (ref 22–32)
Calcium: 7.7 mg/dL — ABNORMAL LOW (ref 8.9–10.3)
Chloride: 105 mmol/L (ref 98–111)
Creatinine, Ser: 0.65 mg/dL (ref 0.61–1.24)
GFR, Estimated: >60 mL/min (ref >60)
Glucose, Bld: 86 mg/dL (ref 70–99)
Potassium: 3.4 mmol/L — ABNORMAL LOW (ref 3.5–5.1)
Sodium: 137 mmol/L (ref 135–145)

## 2023-02-06 LAB — CBC WITH DIFFERENTIAL/PLATELET
Abs Immature Granulocytes: 0.03 10*3/uL (ref 0.00–0.07)
Basophils Absolute: 0 10*3/uL (ref 0.0–0.1)
Basophils Relative: 0 %
Eosinophils Absolute: 0.1 10*3/uL (ref 0.0–0.5)
Eosinophils Relative: 2 %
HCT: 27.5 % — ABNORMAL LOW (ref 39.0–52.0)
Hemoglobin: 9 g/dL — ABNORMAL LOW (ref 13.0–17.0)
Immature Granulocytes: 0 %
Lymphocytes Relative: 8 %
Lymphs Abs: 0.6 10*3/uL — ABNORMAL LOW (ref 0.7–4.0)
MCH: 32.4 pg (ref 26.0–34.0)
MCHC: 32.7 g/dL (ref 30.0–36.0)
MCV: 98.9 fL (ref 80.0–100.0)
Monocytes Absolute: 0.8 10*3/uL (ref 0.1–1.0)
Monocytes Relative: 11 %
Neutro Abs: 5.8 10*3/uL (ref 1.7–7.7)
Neutrophils Relative %: 79 %
Platelets: 187 10*3/uL (ref 150–400)
RBC: 2.78 MIL/uL — ABNORMAL LOW (ref 4.22–5.81)
RDW: 13.2 % (ref 11.5–15.5)
WBC: 7.3 10*3/uL (ref 4.0–10.5)
nRBC: 0 % (ref 0.0–0.2)

## 2023-02-06 MED ORDER — CEFDINIR 300 MG PO CAPS: 300.0000 mg | ORAL_CAPSULE | Freq: Two times a day (BID) | ORAL | 0 refills | Status: AC

## 2023-02-06 MED ORDER — POTASSIUM CHLORIDE CRYS ER 20 MEQ PO TBCR
40.0000 meq | EXTENDED_RELEASE_TABLET | Freq: Once | ORAL | Status: AC
  Administered 2023-02-06: 40 meq via ORAL
  Filled 2023-02-06: qty 2

## 2023-02-06 NOTE — Plan of Care (Signed)
  Problem: Clinical Measurements: Goal: Will remain free from infection Outcome: Adequate for Discharge   

## 2023-02-06 NOTE — Progress Notes (Signed)
AVS reviewed w/ wife who verbalized an understanding- Pt to car in w/c. Pt ate lunch prior to leaving

## 2023-02-06 NOTE — TOC Transition Note (Signed)
Transition of Care [facility]) - CM/SW Discharge Note   Patient Details  Name: Steven [name] MRN: [id number] Date of Birth: 1934/07/18  Transition of Care [facility]) CM/SW Contact:  Adrian Prows, RN Phone Number: 02/06/2023, 12:57 PM   Clinical Narrative:    Orders received for HHPT/OT; spoke w/ pt in room; he declined services; pt says he goes to [facility] weekly; no TOC needs.   Final next level of care: Home/Self Care Barriers to Discharge: No Barriers Identified   Patient Goals and CMS Choice CMS Medicare.gov Compare Post Acute Care list provided to:: Patient Choice offered to / list presented to : Patient  Discharge Placement                         Discharge Plan and Services Additional resources added to the After Visit Summary for     Discharge Planning Services: CM Consult                      HH Arranged: Patient Refused [facility] HH Agency: NA        Social Determinants of Health (SDOH) Interventions SDOH Screenings   Food Insecurity: No Food Insecurity (02/04/2023)  Housing: Low Risk  (02/04/2023)  Transportation Needs: No Transportation Needs (02/04/2023)  Utilities: Not At Risk (02/04/2023)  Financial Resource Strain: Low Risk  (03/09/2019)  Physical Activity: Insufficiently Active (03/09/2019)  Social Connections: Unknown (03/09/2019)  Stress: No Stress Concern Present (03/09/2019)  Tobacco Use: Medium Risk (02/04/2023)     Readmission Risk Interventions     No data to display

## 2023-02-06 NOTE — Progress Notes (Signed)
Tele removed - central tele called by this RN to notify. PIV removed a 2 as noted- awaiting arrival of wife to review AVS. Pt has not rec'd his lunch tray - no order noted when this RN called dietary- house tray will be sent up.

## 2023-02-06 NOTE — Plan of Care (Signed)

## 2023-02-06 NOTE — Discharge Summary (Signed)
[name], MD Follow up in 1 week(s).   Specialty: Internal Medicine Contact information: [address] [place] [address] [phone]                Allergies  Allergen Reactions   Gluten Meal Other (See Comments)    GLUTEN ALLERGY    Indomethacin Hives   Lactose Intolerance (Gi) Nausea And Vomiting and Other (See Comments)    GI UPSET    Consultations: None   Procedures/Studies: DG Chest 2 View  Result Date: 02/04/2023 CLINICAL DATA:  Cough and fever.  Congestion. EXAM: CHEST - 2 VIEW COMPARISON:  Radiograph 07/11/2022, CT 08/14/2023 FINDINGS: Patchy airspace disease in the right mid lung suspicious for pneumonia. The heart is normal in size. Aortic atherosclerosis. No pleural effusion, pulmonary edema, or pneumothorax. No evidence of acute osseous abnormality. IMPRESSION: Patchy airspace disease in the right mid lung suspicious for pneumonia. Recommend radiographic follow-up to resolution. Electronically Signed   By: Narda Rutherford M.D.   On: 02/04/2023 16:41   MR BRAIN WO CONTRAST  Result Date: 01/27/2023 Table formatting from the original result was not included. [facility] NEUROIMAGING REPORT STUDY DATE: 01/26/23  PATIENT NAME: Steven [name] DOB: 13-Aug-1934 MRN: [id number] ORDERING CLINICIAN: Windell Norfolk, MD CLINICAL HISTORY: 88 y.o. year old male with: 1. Dizziness   EXAM: MR BRAIN WO CONTRAST TECHNIQUE: MRI of the brain with and without contrast was obtained utilizing multiplanar, multiecho pulse sequences. CONTRAST: Diagnostic Product Medications (last 72 hours)   None   COMPARISON: 03/28/21 MRI IMAGING SITE:  IMAGING [facility]  MRI Imaging [address] [place] [address] FINDINGS: No abnormal lesions are seen on diffusion-weighted views to suggest acute ischemia. The cortical sulci, fissures and cisterns are mild biparietal and perisylvian atrophy.  Lateral, third and fourth ventricle are normal in size and appearance. No extra-axial fluid collections are seen. No evidence of mass effect or midline shift.  Mild periventricular and subcortical chronic small vessel ischemic disease.  No abnormal lesions are seen on post contrast views.  On sagittal views the posterior fossa, pituitary gland and corpus callosum are unremarkable. No evidence of intracranial hemorrhage on SWI views. The orbits and their contents, paranasal sinuses and calvarium are notable for bilateral lens extractions. Intracranial flow voids are present.   MRI brain (without) demonstrating: -Mild atrophy and mild chronic small vessel ischemic disease. -No acute findings. INTERPRETING PHYSICIAN: Suanne Marker, MD Certified in Neurology, Neurophysiology and Neuroimaging [facility]: [facility] [address], [address] [place], [place] [address] [phone]    Discharge Exam: Vitals:   02/05/23 2027 02/06/23 0419  BP: (!) 151/103 (!) 114/52  Pulse: 88 75  Resp: 16 14  Temp: 99 F (37.2 C) 98.5 F (36.9 C)  SpO2: 99% 98%   Vitals:   02/05/23 1634 02/05/23 2012 02/05/23 2027 02/06/23 0419  BP:   (!) 151/103 (!) 114/52  Pulse:   88 75  Resp:   16 14  Temp:   99 F (37.2 C) 98.5 F (36.9 C)  TempSrc:    Oral Oral  SpO2: 96% 95% 99% 98%  Weight:      Height:        General: Pt is alert, awake, not in acute distress Cardiovascular: RRR, S1/S2 +, no rubs, no gallops Respiratory: Improving rhonchi right middle lobe. Abdominal: Soft, NT, ND, bowel sounds + Extremities: no edema, no cyanosis    The results of significant diagnostics from this hospitalization (including imaging,  Physician Discharge Summary  BURWELL HOAGE AVW:[id number] DOB: July 01, 1934 DOA: 02/04/2023  PCP: [name], MD  Admit date: 02/04/2023 Discharge date: 02/06/2023 30 Day Unplanned Readmission Risk Score    Flowsheet Row ED to Hosp-Admission (Current) from 02/04/2023 in [facility] 4TH FLOOR PROGRESSIVE CARE AND UROLOGY  30 Day Unplanned Readmission Risk Score (%) 16.55 Filed at 02/06/2023 0800       This score is the patient's risk of an unplanned readmission within 30 days of being discharged (0 -100%). The score is based on dignosis, age, lab data, medications, orders, and past utilization.   Low:  0-14.9   Medium: 15-21.9   High: 22-29.9   Extreme: 30 and above          Admitted From: Home Disposition: Home  Recommendations for Outpatient Follow-up:  Follow up with PCP in 1-2 weeks Please obtain BMP/CBC in one week Please follow up with your PCP on the following pending results: Unresulted Labs (From admission, onward)     Start     Ordered   02/05/23 0951  Strep pneumoniae urinary antigen  Once,   R        02/05/23 0950   02/05/23 0951  Legionella Pneumophila Serogp 1 Ur Ag  Once,   R        02/05/23 0950              Home Health: Yes Equipment/Devices: None  Discharge Condition: Stable CODE STATUS: Full code Diet recommendation: Cardiac  Subjective: Seen and examined.  He feels well and " fantastic".  Feels ready to go home.  Brief/Interim Summary: Steven [name] is a 88 y.o. male with medical history significant for ankylosing spondylitis, anxiety, osteoarthritis, depression, dyslipidemia and SBO, who presented to the emergency room wit recurrent falls.  He denied any loss of consciousness. He stated that he fell and could not get up on the day of admission from generalized weakness.  He experienced dizziness and lightheadedness before that but has been having chronic dizziness.  No focal muscle weakness or paresthesias.  No dysarthria or dysphagia. When he  came to the ER, temperature was one 1.4 with heart rate of 95 and respiratory to 22, pulse currently 90% on room air and BP 117/62.  Labs revealed borderline potassium of 3.5 and albumin 3.2, blood glucose of 136 with otherwise unremarkable CMP.  Lactic acid with 2.4 and later 2.5.  CBC showed leukocytosis 15.4 with neutrophilia and anemia.  Respiratory panel came back negative.  UA was unremarkable.  Blood cultures were sent.  X-ray showed patchy airspace disease in right midlung.  Patient admitted under hospitalist service secondary to Sepsis secondary to community-acquired pneumonia, POA: Met sepsis criteria based on fever, tachypnea and leukocytosis.  Evidence of right middle lobe infiltrate/pneumonia on chest x-ray.  He is not hypoxic.  Procalcitonin 0.44.  Sepsis parameters improved.  He was started on Rocephin and Zithromax.  COVID, RSV, flu and respiratory viral panel were negative.  Blood culture negative.  Now has improved enough and feels comfortable going home.  Discharging home on cefdinir for 5 days.   Hypokalemia: Replenished but low again, will be replenished again before discharge.   Recurrent falls: Consulted PT OT.  They recommended home health PT.  Orthostatic hypotension: While working with OT, he was orthostatic hypotensive but asymptomatic.  Given IV fluids since yesterday.  Orthostatics are normal today.   Anemia of chronic disease: Baseline hemoglobin appears to be around 10, he is currently at baseline.  Physician Discharge Summary  BURWELL HOAGE AVW:[id number] DOB: July 01, 1934 DOA: 02/04/2023  PCP: [name], MD  Admit date: 02/04/2023 Discharge date: 02/06/2023 30 Day Unplanned Readmission Risk Score    Flowsheet Row ED to Hosp-Admission (Current) from 02/04/2023 in [facility] 4TH FLOOR PROGRESSIVE CARE AND UROLOGY  30 Day Unplanned Readmission Risk Score (%) 16.55 Filed at 02/06/2023 0800       This score is the patient's risk of an unplanned readmission within 30 days of being discharged (0 -100%). The score is based on dignosis, age, lab data, medications, orders, and past utilization.   Low:  0-14.9   Medium: 15-21.9   High: 22-29.9   Extreme: 30 and above          Admitted From: Home Disposition: Home  Recommendations for Outpatient Follow-up:  Follow up with PCP in 1-2 weeks Please obtain BMP/CBC in one week Please follow up with your PCP on the following pending results: Unresulted Labs (From admission, onward)     Start     Ordered   02/05/23 0951  Strep pneumoniae urinary antigen  Once,   R        02/05/23 0950   02/05/23 0951  Legionella Pneumophila Serogp 1 Ur Ag  Once,   R        02/05/23 0950              Home Health: Yes Equipment/Devices: None  Discharge Condition: Stable CODE STATUS: Full code Diet recommendation: Cardiac  Subjective: Seen and examined.  He feels well and " fantastic".  Feels ready to go home.  Brief/Interim Summary: Steven [name] is a 88 y.o. male with medical history significant for ankylosing spondylitis, anxiety, osteoarthritis, depression, dyslipidemia and SBO, who presented to the emergency room wit recurrent falls.  He denied any loss of consciousness. He stated that he fell and could not get up on the day of admission from generalized weakness.  He experienced dizziness and lightheadedness before that but has been having chronic dizziness.  No focal muscle weakness or paresthesias.  No dysarthria or dysphagia. When he  came to the ER, temperature was one 1.4 with heart rate of 95 and respiratory to 22, pulse currently 90% on room air and BP 117/62.  Labs revealed borderline potassium of 3.5 and albumin 3.2, blood glucose of 136 with otherwise unremarkable CMP.  Lactic acid with 2.4 and later 2.5.  CBC showed leukocytosis 15.4 with neutrophilia and anemia.  Respiratory panel came back negative.  UA was unremarkable.  Blood cultures were sent.  X-ray showed patchy airspace disease in right midlung.  Patient admitted under hospitalist service secondary to Sepsis secondary to community-acquired pneumonia, POA: Met sepsis criteria based on fever, tachypnea and leukocytosis.  Evidence of right middle lobe infiltrate/pneumonia on chest x-ray.  He is not hypoxic.  Procalcitonin 0.44.  Sepsis parameters improved.  He was started on Rocephin and Zithromax.  COVID, RSV, flu and respiratory viral panel were negative.  Blood culture negative.  Now has improved enough and feels comfortable going home.  Discharging home on cefdinir for 5 days.   Hypokalemia: Replenished but low again, will be replenished again before discharge.   Recurrent falls: Consulted PT OT.  They recommended home health PT.  Orthostatic hypotension: While working with OT, he was orthostatic hypotensive but asymptomatic.  Given IV fluids since yesterday.  Orthostatics are normal today.   Anemia of chronic disease: Baseline hemoglobin appears to be around 10, he is currently at baseline.  [name], MD Follow up in 1 week(s).   Specialty: Internal Medicine Contact information: [address] [place] [address] [phone]                Allergies  Allergen Reactions   Gluten Meal Other (See Comments)    GLUTEN ALLERGY    Indomethacin Hives   Lactose Intolerance (Gi) Nausea And Vomiting and Other (See Comments)    GI UPSET    Consultations: None   Procedures/Studies: DG Chest 2 View  Result Date: 02/04/2023 CLINICAL DATA:  Cough and fever.  Congestion. EXAM: CHEST - 2 VIEW COMPARISON:  Radiograph 07/11/2022, CT 08/14/2023 FINDINGS: Patchy airspace disease in the right mid lung suspicious for pneumonia. The heart is normal in size. Aortic atherosclerosis. No pleural effusion, pulmonary edema, or pneumothorax. No evidence of acute osseous abnormality. IMPRESSION: Patchy airspace disease in the right mid lung suspicious for pneumonia. Recommend radiographic follow-up to resolution. Electronically Signed   By: Narda Rutherford M.D.   On: 02/04/2023 16:41   MR BRAIN WO CONTRAST  Result Date: 01/27/2023 Table formatting from the original result was not included. [facility] NEUROIMAGING REPORT STUDY DATE: 01/26/23  PATIENT NAME: Steven [name] DOB: 13-Aug-1934 MRN: [id number] ORDERING CLINICIAN: Windell Norfolk, MD CLINICAL HISTORY: 88 y.o. year old male with: 1. Dizziness   EXAM: MR BRAIN WO CONTRAST TECHNIQUE: MRI of the brain with and without contrast was obtained utilizing multiplanar, multiecho pulse sequences. CONTRAST: Diagnostic Product Medications (last 72 hours)   None   COMPARISON: 03/28/21 MRI IMAGING SITE:  IMAGING [facility]  MRI Imaging [address] [place] [address] FINDINGS: No abnormal lesions are seen on diffusion-weighted views to suggest acute ischemia. The cortical sulci, fissures and cisterns are mild biparietal and perisylvian atrophy.  Lateral, third and fourth ventricle are normal in size and appearance. No extra-axial fluid collections are seen. No evidence of mass effect or midline shift.  Mild periventricular and subcortical chronic small vessel ischemic disease.  No abnormal lesions are seen on post contrast views.  On sagittal views the posterior fossa, pituitary gland and corpus callosum are unremarkable. No evidence of intracranial hemorrhage on SWI views. The orbits and their contents, paranasal sinuses and calvarium are notable for bilateral lens extractions. Intracranial flow voids are present.   MRI brain (without) demonstrating: -Mild atrophy and mild chronic small vessel ischemic disease. -No acute findings. INTERPRETING PHYSICIAN: Suanne Marker, MD Certified in Neurology, Neurophysiology and Neuroimaging [facility]: [facility] [address], [address] [place], [place] [address] [phone]    Discharge Exam: Vitals:   02/05/23 2027 02/06/23 0419  BP: (!) 151/103 (!) 114/52  Pulse: 88 75  Resp: 16 14  Temp: 99 F (37.2 C) 98.5 F (36.9 C)  SpO2: 99% 98%   Vitals:   02/05/23 1634 02/05/23 2012 02/05/23 2027 02/06/23 0419  BP:   (!) 151/103 (!) 114/52  Pulse:   88 75  Resp:   16 14  Temp:   99 F (37.2 C) 98.5 F (36.9 C)  TempSrc:    Oral Oral  SpO2: 96% 95% 99% 98%  Weight:      Height:        General: Pt is alert, awake, not in acute distress Cardiovascular: RRR, S1/S2 +, no rubs, no gallops Respiratory: Improving rhonchi right middle lobe. Abdominal: Soft, NT, ND, bowel sounds + Extremities: no edema, no cyanosis    The results of significant diagnostics from this hospitalization (including imaging,  Physician Discharge Summary  BURWELL HOAGE AVW:[id number] DOB: July 01, 1934 DOA: 02/04/2023  PCP: [name], MD  Admit date: 02/04/2023 Discharge date: 02/06/2023 30 Day Unplanned Readmission Risk Score    Flowsheet Row ED to Hosp-Admission (Current) from 02/04/2023 in [facility] 4TH FLOOR PROGRESSIVE CARE AND UROLOGY  30 Day Unplanned Readmission Risk Score (%) 16.55 Filed at 02/06/2023 0800       This score is the patient's risk of an unplanned readmission within 30 days of being discharged (0 -100%). The score is based on dignosis, age, lab data, medications, orders, and past utilization.   Low:  0-14.9   Medium: 15-21.9   High: 22-29.9   Extreme: 30 and above          Admitted From: Home Disposition: Home  Recommendations for Outpatient Follow-up:  Follow up with PCP in 1-2 weeks Please obtain BMP/CBC in one week Please follow up with your PCP on the following pending results: Unresulted Labs (From admission, onward)     Start     Ordered   02/05/23 0951  Strep pneumoniae urinary antigen  Once,   R        02/05/23 0950   02/05/23 0951  Legionella Pneumophila Serogp 1 Ur Ag  Once,   R        02/05/23 0950              Home Health: Yes Equipment/Devices: None  Discharge Condition: Stable CODE STATUS: Full code Diet recommendation: Cardiac  Subjective: Seen and examined.  He feels well and " fantastic".  Feels ready to go home.  Brief/Interim Summary: Steven [name] is a 88 y.o. male with medical history significant for ankylosing spondylitis, anxiety, osteoarthritis, depression, dyslipidemia and SBO, who presented to the emergency room wit recurrent falls.  He denied any loss of consciousness. He stated that he fell and could not get up on the day of admission from generalized weakness.  He experienced dizziness and lightheadedness before that but has been having chronic dizziness.  No focal muscle weakness or paresthesias.  No dysarthria or dysphagia. When he  came to the ER, temperature was one 1.4 with heart rate of 95 and respiratory to 22, pulse currently 90% on room air and BP 117/62.  Labs revealed borderline potassium of 3.5 and albumin 3.2, blood glucose of 136 with otherwise unremarkable CMP.  Lactic acid with 2.4 and later 2.5.  CBC showed leukocytosis 15.4 with neutrophilia and anemia.  Respiratory panel came back negative.  UA was unremarkable.  Blood cultures were sent.  X-ray showed patchy airspace disease in right midlung.  Patient admitted under hospitalist service secondary to Sepsis secondary to community-acquired pneumonia, POA: Met sepsis criteria based on fever, tachypnea and leukocytosis.  Evidence of right middle lobe infiltrate/pneumonia on chest x-ray.  He is not hypoxic.  Procalcitonin 0.44.  Sepsis parameters improved.  He was started on Rocephin and Zithromax.  COVID, RSV, flu and respiratory viral panel were negative.  Blood culture negative.  Now has improved enough and feels comfortable going home.  Discharging home on cefdinir for 5 days.   Hypokalemia: Replenished but low again, will be replenished again before discharge.   Recurrent falls: Consulted PT OT.  They recommended home health PT.  Orthostatic hypotension: While working with OT, he was orthostatic hypotensive but asymptomatic.  Given IV fluids since yesterday.  Orthostatics are normal today.   Anemia of chronic disease: Baseline hemoglobin appears to be around 10, he is currently at baseline.  Physician Discharge Summary  BURWELL HOAGE AVW:[id number] DOB: July 01, 1934 DOA: 02/04/2023  PCP: [name], MD  Admit date: 02/04/2023 Discharge date: 02/06/2023 30 Day Unplanned Readmission Risk Score    Flowsheet Row ED to Hosp-Admission (Current) from 02/04/2023 in [facility] 4TH FLOOR PROGRESSIVE CARE AND UROLOGY  30 Day Unplanned Readmission Risk Score (%) 16.55 Filed at 02/06/2023 0800       This score is the patient's risk of an unplanned readmission within 30 days of being discharged (0 -100%). The score is based on dignosis, age, lab data, medications, orders, and past utilization.   Low:  0-14.9   Medium: 15-21.9   High: 22-29.9   Extreme: 30 and above          Admitted From: Home Disposition: Home  Recommendations for Outpatient Follow-up:  Follow up with PCP in 1-2 weeks Please obtain BMP/CBC in one week Please follow up with your PCP on the following pending results: Unresulted Labs (From admission, onward)     Start     Ordered   02/05/23 0951  Strep pneumoniae urinary antigen  Once,   R        02/05/23 0950   02/05/23 0951  Legionella Pneumophila Serogp 1 Ur Ag  Once,   R        02/05/23 0950              Home Health: Yes Equipment/Devices: None  Discharge Condition: Stable CODE STATUS: Full code Diet recommendation: Cardiac  Subjective: Seen and examined.  He feels well and " fantastic".  Feels ready to go home.  Brief/Interim Summary: Steven [name] is a 88 y.o. male with medical history significant for ankylosing spondylitis, anxiety, osteoarthritis, depression, dyslipidemia and SBO, who presented to the emergency room wit recurrent falls.  He denied any loss of consciousness. He stated that he fell and could not get up on the day of admission from generalized weakness.  He experienced dizziness and lightheadedness before that but has been having chronic dizziness.  No focal muscle weakness or paresthesias.  No dysarthria or dysphagia. When he  came to the ER, temperature was one 1.4 with heart rate of 95 and respiratory to 22, pulse currently 90% on room air and BP 117/62.  Labs revealed borderline potassium of 3.5 and albumin 3.2, blood glucose of 136 with otherwise unremarkable CMP.  Lactic acid with 2.4 and later 2.5.  CBC showed leukocytosis 15.4 with neutrophilia and anemia.  Respiratory panel came back negative.  UA was unremarkable.  Blood cultures were sent.  X-ray showed patchy airspace disease in right midlung.  Patient admitted under hospitalist service secondary to Sepsis secondary to community-acquired pneumonia, POA: Met sepsis criteria based on fever, tachypnea and leukocytosis.  Evidence of right middle lobe infiltrate/pneumonia on chest x-ray.  He is not hypoxic.  Procalcitonin 0.44.  Sepsis parameters improved.  He was started on Rocephin and Zithromax.  COVID, RSV, flu and respiratory viral panel were negative.  Blood culture negative.  Now has improved enough and feels comfortable going home.  Discharging home on cefdinir for 5 days.   Hypokalemia: Replenished but low again, will be replenished again before discharge.   Recurrent falls: Consulted PT OT.  They recommended home health PT.  Orthostatic hypotension: While working with OT, he was orthostatic hypotensive but asymptomatic.  Given IV fluids since yesterday.  Orthostatics are normal today.   Anemia of chronic disease: Baseline hemoglobin appears to be around 10, he is currently at baseline.  Physician Discharge Summary  BURWELL HOAGE AVW:[id number] DOB: July 01, 1934 DOA: 02/04/2023  PCP: [name], MD  Admit date: 02/04/2023 Discharge date: 02/06/2023 30 Day Unplanned Readmission Risk Score    Flowsheet Row ED to Hosp-Admission (Current) from 02/04/2023 in [facility] 4TH FLOOR PROGRESSIVE CARE AND UROLOGY  30 Day Unplanned Readmission Risk Score (%) 16.55 Filed at 02/06/2023 0800       This score is the patient's risk of an unplanned readmission within 30 days of being discharged (0 -100%). The score is based on dignosis, age, lab data, medications, orders, and past utilization.   Low:  0-14.9   Medium: 15-21.9   High: 22-29.9   Extreme: 30 and above          Admitted From: Home Disposition: Home  Recommendations for Outpatient Follow-up:  Follow up with PCP in 1-2 weeks Please obtain BMP/CBC in one week Please follow up with your PCP on the following pending results: Unresulted Labs (From admission, onward)     Start     Ordered   02/05/23 0951  Strep pneumoniae urinary antigen  Once,   R        02/05/23 0950   02/05/23 0951  Legionella Pneumophila Serogp 1 Ur Ag  Once,   R        02/05/23 0950              Home Health: Yes Equipment/Devices: None  Discharge Condition: Stable CODE STATUS: Full code Diet recommendation: Cardiac  Subjective: Seen and examined.  He feels well and " fantastic".  Feels ready to go home.  Brief/Interim Summary: Steven [name] is a 88 y.o. male with medical history significant for ankylosing spondylitis, anxiety, osteoarthritis, depression, dyslipidemia and SBO, who presented to the emergency room wit recurrent falls.  He denied any loss of consciousness. He stated that he fell and could not get up on the day of admission from generalized weakness.  He experienced dizziness and lightheadedness before that but has been having chronic dizziness.  No focal muscle weakness or paresthesias.  No dysarthria or dysphagia. When he  came to the ER, temperature was one 1.4 with heart rate of 95 and respiratory to 22, pulse currently 90% on room air and BP 117/62.  Labs revealed borderline potassium of 3.5 and albumin 3.2, blood glucose of 136 with otherwise unremarkable CMP.  Lactic acid with 2.4 and later 2.5.  CBC showed leukocytosis 15.4 with neutrophilia and anemia.  Respiratory panel came back negative.  UA was unremarkable.  Blood cultures were sent.  X-ray showed patchy airspace disease in right midlung.  Patient admitted under hospitalist service secondary to Sepsis secondary to community-acquired pneumonia, POA: Met sepsis criteria based on fever, tachypnea and leukocytosis.  Evidence of right middle lobe infiltrate/pneumonia on chest x-ray.  He is not hypoxic.  Procalcitonin 0.44.  Sepsis parameters improved.  He was started on Rocephin and Zithromax.  COVID, RSV, flu and respiratory viral panel were negative.  Blood culture negative.  Now has improved enough and feels comfortable going home.  Discharging home on cefdinir for 5 days.   Hypokalemia: Replenished but low again, will be replenished again before discharge.   Recurrent falls: Consulted PT OT.  They recommended home health PT.  Orthostatic hypotension: While working with OT, he was orthostatic hypotensive but asymptomatic.  Given IV fluids since yesterday.  Orthostatics are normal today.   Anemia of chronic disease: Baseline hemoglobin appears to be around 10, he is currently at baseline.  [name], MD Follow up in 1 week(s).   Specialty: Internal Medicine Contact information: [address] [address] [place] [address] [phone]                Allergies  Allergen Reactions   Gluten Meal Other (See Comments)    GLUTEN ALLERGY    Indomethacin Hives   Lactose Intolerance (Gi) Nausea And Vomiting and Other (See Comments)    GI UPSET    Consultations: None   Procedures/Studies: DG Chest 2 View  Result Date: 02/04/2023 CLINICAL DATA:  Cough and fever.  Congestion. EXAM: CHEST - 2 VIEW COMPARISON:  Radiograph 07/11/2022, CT 08/14/2023 FINDINGS: Patchy airspace disease in the right mid lung suspicious for pneumonia. The heart is normal in size. Aortic atherosclerosis. No pleural effusion, pulmonary edema, or pneumothorax. No evidence of acute osseous abnormality. IMPRESSION: Patchy airspace disease in the right mid lung suspicious for pneumonia. Recommend radiographic follow-up to resolution. Electronically Signed   By: Narda Rutherford M.D.   On: 02/04/2023 16:41   MR BRAIN WO CONTRAST  Result Date: 01/27/2023 Table formatting from the original result was not included. [facility] NEUROIMAGING REPORT STUDY DATE: 01/26/23  PATIENT NAME: Steven [name] DOB: 13-Aug-1934 MRN: [id number] ORDERING CLINICIAN: Windell Norfolk, MD CLINICAL HISTORY: 88 y.o. year old male with: 1. Dizziness   EXAM: MR BRAIN WO CONTRAST TECHNIQUE: MRI of the brain with and without contrast was obtained utilizing multiplanar, multiecho pulse sequences. CONTRAST: Diagnostic Product Medications (last 72 hours)   None   COMPARISON: 03/28/21 MRI IMAGING SITE:  IMAGING DRI [facility]  MRI Imaging [address] [place] [address] FINDINGS: No abnormal lesions are seen on diffusion-weighted views to suggest acute ischemia. The cortical sulci, fissures and cisterns are mild biparietal and perisylvian atrophy.  Lateral, third and fourth ventricle are normal in size and appearance. No extra-axial fluid collections are seen. No evidence of mass effect or midline shift.  Mild periventricular and subcortical chronic small vessel ischemic disease.  No abnormal lesions are seen on post contrast views.  On sagittal views the posterior fossa, pituitary gland and corpus callosum are unremarkable. No evidence of intracranial hemorrhage on SWI views. The orbits and their contents, paranasal sinuses and calvarium are notable for bilateral lens extractions. Intracranial flow voids are present.   MRI brain (without) demonstrating: -Mild atrophy and mild chronic small vessel ischemic disease. -No acute findings. INTERPRETING PHYSICIAN: Suanne Marker, MD Certified in Neurology, Neurophysiology and Neuroimaging [facility]: [facility] [address], [address] [place], [place] [address] [phone]    Discharge Exam: Vitals:   02/05/23 2027 02/06/23 0419  BP: (!) 151/103 (!) 114/52  Pulse: 88 75  Resp: 16 14  Temp: 99 F (37.2 C) 98.5 F (36.9 C)  SpO2: 99% 98%   Vitals:   02/05/23 1634 02/05/23 2012 02/05/23 2027 02/06/23 0419  BP:   (!) 151/103 (!) 114/52  Pulse:   88 75  Resp:   16 14  Temp:   99 F (37.2 C) 98.5 F (36.9 C)  TempSrc:    Oral Oral  SpO2: 96% 95% 99% 98%  Weight:      Height:        General: Pt is alert, awake, not in acute distress Cardiovascular: RRR, S1/S2 +, no rubs, no gallops Respiratory: Improving rhonchi right middle lobe. Abdominal: Soft, NT, ND, bowel sounds + Extremities: no edema, no cyanosis    The results of significant diagnostics from this hospitalization (including imaging,  Physician Discharge Summary  BURWELL HOAGE AVW:[id number] DOB: July 01, 1934 DOA: 02/04/2023  PCP: [name], MD  Admit date: 02/04/2023 Discharge date: 02/06/2023 30 Day Unplanned Readmission Risk Score    Flowsheet Row ED to Hosp-Admission (Current) from 02/04/2023 in [place] 4TH FLOOR PROGRESSIVE CARE AND UROLOGY  30 Day Unplanned Readmission Risk Score (%) 16.55 Filed at 02/06/2023 0800       This score is the patient's risk of an unplanned readmission within 30 days of being discharged (0 -100%). The score is based on dignosis, age, lab data, medications, orders, and past utilization.   Low:  0-14.9   Medium: 15-21.9   High: 22-29.9   Extreme: 30 and above          Admitted From: Home Disposition: Home  Recommendations for Outpatient Follow-up:  Follow up with PCP in 1-2 weeks Please obtain BMP/CBC in one week Please follow up with your PCP on the following pending results: Unresulted Labs (From admission, onward)     Start     Ordered   02/05/23 0951  Strep pneumoniae urinary antigen  Once,   R        02/05/23 0950   02/05/23 0951  Legionella Pneumophila Serogp 1 Ur Ag  Once,   R        02/05/23 0950              Home Health: Yes Equipment/Devices: None  Discharge Condition: Stable CODE STATUS: Full code Diet recommendation: Cardiac  Subjective: Seen and examined.  He feels well and " fantastic".  Feels ready to go home.  Brief/Interim Summary: Steven [name] is a 88 y.o. male with medical history significant for ankylosing spondylitis, anxiety, osteoarthritis, depression, dyslipidemia and SBO, who presented to the emergency room wit recurrent falls.  He denied any loss of consciousness. He stated that he fell and could not get up on the day of admission from generalized weakness.  He experienced dizziness and lightheadedness before that but has been having chronic dizziness.  No focal muscle weakness or paresthesias.  No dysarthria or dysphagia. When he  came to the ER, temperature was one 1.4 with heart rate of 95 and respiratory to 22, pulse currently 90% on room air and BP 117/62.  Labs revealed borderline potassium of 3.5 and albumin 3.2, blood glucose of 136 with otherwise unremarkable CMP.  Lactic acid with 2.4 and later 2.5.  CBC showed leukocytosis 15.4 with neutrophilia and anemia.  Respiratory panel came back negative.  UA was unremarkable.  Blood cultures were sent.  X-ray showed patchy airspace disease in right midlung.  Patient admitted under hospitalist service secondary to Sepsis secondary to community-acquired pneumonia, POA: Met sepsis criteria based on fever, tachypnea and leukocytosis.  Evidence of right middle lobe infiltrate/pneumonia on chest x-ray.  He is not hypoxic.  Procalcitonin 0.44.  Sepsis parameters improved.  He was started on Rocephin and Zithromax.  COVID, RSV, flu and respiratory viral panel were negative.  Blood culture negative.  Now has improved enough and feels comfortable going home.  Discharging home on cefdinir for 5 days.   Hypokalemia: Replenished but low again, will be replenished again before discharge.   Recurrent falls: Consulted PT OT.  They recommended home health PT.  Orthostatic hypotension: While working with OT, he was orthostatic hypotensive but asymptomatic.  Given IV fluids since yesterday.  Orthostatics are normal today.   Anemia of chronic disease: Baseline hemoglobin appears to be around 10, he is currently at baseline.

## 2023-02-06 NOTE — Evaluation (Signed)
Occupational Therapy Evaluation Patient Details Name: Steven [name] MRN: [id number] DOB: 1934-06-15 Today's Date: 02/06/2023   History of Present Illness 88 y.o. male presents hospital on 02/04/2023 due to falls and sepsis secondary to PNA. Pt  medical history significant for ankylosing spondylitis on methotrexate, TIA, CAS s/p left TCAR, iron deficiency anemia, and HLD.   Clinical Impression   Patient is a 88 year old male who was admitted for above. Patient  was living at home with PRN support from wife at [facility] level at night time only. Patient needed RW during session for balance and safety cues for navigation in room with patient impulsive at times to get to bathroom when sense of urgency hit. Patient blood pressures as noted in chart during session. Patient was noted to have decreased functional activity tolerance, decreased endurance, decreased standing balance, decreased safety awareness, and decreased knowledge of AD/AE impacting participation in ADLs. Patient would continue to benefit from skilled OT services at this time while admitted and after d/c to address noted deficits in order to improve overall safety and independence in ADLs.        If plan is discharge home, recommend the following: A little help with walking and/or transfers;A little help with bathing/dressing/bathroom;Direct supervision/assist for medications management;Assistance with cooking/housework;Direct supervision/assist for financial management;Help with stairs or ramp for entrance;Assist for transportation    Functional Status Assessment  Patient has had a recent decline in their functional status and demonstrates the ability to make significant improvements in function in a reasonable and predictable amount of time.  Equipment Recommendations  None recommended by OT       Precautions / Restrictions Precautions Precautions: Fall Restrictions Weight Bearing Restrictions: No      Mobility Bed  Mobility Overal bed mobility: Modified Independent                        Balance Overall balance assessment: Needs assistance, History of Falls Sitting-balance support: Feet supported Sitting balance-[name]: Good     Standing balance support: During functional activity, Reliant on assistive device for balance Standing balance-[name]: Fair         ADL either performed or assessed with clinical judgement   ADL Overall ADL's : Needs assistance/impaired Eating/Feeding: Modified independent;Sitting   Grooming: Sitting;Set up   Upper Body Bathing: Sitting;Set up   Lower Body Bathing: Sit to/from stand;Sitting/lateral leans;Minimal assistance   Upper Body Dressing : Sitting;Set up   Lower Body Dressing: Sit to/from stand;Sitting/lateral leans;Minimal assistance   Toilet Transfer: Supervision/safety;Rolling walker (2 wheels);Regular Toilet;Ambulation   Toileting- Clothing Manipulation and Hygiene: Supervision/safety;Sit to/from stand               Vision Baseline Vision/History: 1 Wears glasses Vision Assessment?: No apparent visual deficits            Pertinent Vitals/Pain Pain Assessment Pain Assessment: No/denies pain     Extremity/Trunk Assessment Upper Extremity Assessment Upper Extremity Assessment: Overall WFL for tasks assessed   Lower Extremity Assessment Lower Extremity Assessment: Defer to PT evaluation   Cervical / Trunk Assessment Cervical / Trunk Assessment: Other exceptions Cervical / Trunk Exceptions: ankylosing spondyliitis   Communication Communication Communication: Hearing impairment (B hearing aids)   Cognition Arousal: Alert Behavior During Therapy: WFL for tasks assessed/performed Overall Cognitive Status: Within Functional Limits for tasks assessed               Home Living Family/patient expects to be discharged to:: Private residence Living Arrangements:  Spouse/significant other Available Help at  Discharge: Family;Available 24 hours/day Type of Home: House Home Access: Stairs to enter [employer] of Steps: 5 Entrance Stairs-Rails: Can reach both Home Layout: Two level;Able to live on main level with bedroom/bathroom Alternate Level Stairs-Number of Steps: 13 Alternate Level Stairs-Rails: Right;Left;Can reach both Bathroom Shower/Tub: Producer, television/film/video: Handicapped height Bathroom Accessibility: Yes   Home Equipment: Agricultural consultant (2 wheels);Cane - single point;Shower seat;Grab bars - tub/shower   Additional Comments: pt reports particpating with OPPT services priro to hospital admission      Prior Functioning/Environment Prior Level of Function : Independent/Modified Independent;Driving             Mobility Comments: IND with use of RW at night time secondary to hx of falls, mod I for ADLs, self care tasks and IADLs. ADLs Comments: pt reports requiring occational assist donning socks        OT Problem List: Decreased activity tolerance;Impaired balance (sitting and/or standing);Decreased coordination;Decreased safety awareness;Decreased knowledge of precautions;Decreased knowledge of use of DME or AE      OT Treatment/Interventions: Self-care/ADL training;Therapeutic exercise;DME and/or AE instruction;Therapeutic activities;Patient/family education;Balance training    OT Goals(Current goals can be found in the care plan section) Acute Rehab OT Goals Patient Stated Goal: to go home today OT Goal Formulation: With patient Time For Goal Achievement: 02/20/23 Potential to Achieve Goals: Fair ADL Goals Pt Will Perform Lower Body Dressing: with modified independence;with adaptive equipment;sit to/from stand Pt Will Transfer to Toilet: with modified independence;ambulating;regular height toilet Pt Will Perform Toileting - Clothing Manipulation and hygiene: with modified independence;sit to/from stand  OT Frequency: Min 1X/week       AM-PAC  OT "6 Clicks" Daily Activity     Outcome Measure Help from another person eating meals?: None Help from another person taking care of personal grooming?: A Little Help from another person toileting, which includes using toliet, bedpan, or urinal?: A Little Help from another person bathing (including washing, rinsing, drying)?: A Little Help from another person to put on and taking off regular upper body clothing?: None Help from another person to put on and taking off regular lower body clothing?: A Little 6 Click Score: 20   End of Session Equipment Utilized During Treatment: Gait belt;Rolling walker (2 wheels) Nurse Communication: Mobility status;Other (comment) (skin tear reopening during session. and Bps)  Activity Tolerance: Patient tolerated treatment well Patient left: in chair;with call bell/phone within reach;with chair alarm set  OT Visit Diagnosis: Unsteadiness on feet (R26.81);Other abnormalities of gait and mobility (R26.89);Muscle weakness (generalized) (M62.81)                Time: 4401-0272 OT Time Calculation (min): 30 min Charges:  OT General Charges $OT Visit: 1 Visit OT Evaluation $OT Eval Low Complexity: 1 Low OT Treatments $Self Care/Home Management : 8-22 mins  Rosalio Loud, MS Acute Rehabilitation Department Office# [phone]   Selinda Flavin 02/06/2023, 12:31 PM

## 2023-02-10 DIAGNOSIS — T50905A Adverse effect of unspecified drugs, medicaments and biological substances, initial encounter: Secondary | ICD-10-CM | POA: Diagnosis not present

## 2023-02-10 DIAGNOSIS — D539 Nutritional anemia, unspecified: Secondary | ICD-10-CM | POA: Diagnosis not present

## 2023-02-10 DIAGNOSIS — D638 Anemia in other chronic diseases classified elsewhere: Secondary | ICD-10-CM | POA: Diagnosis not present

## 2023-02-10 DIAGNOSIS — Z09 Encounter for follow-up examination after completed treatment for conditions other than malignant neoplasm: Secondary | ICD-10-CM | POA: Diagnosis not present

## 2023-02-10 LAB — CULTURE, BLOOD (ROUTINE X 2)
Culture: NO GROWTH
Culture: NO GROWTH
Special Requests: ADEQUATE

## 2023-02-13 ENCOUNTER — Other Ambulatory Visit: Payer: Self-pay

## 2023-02-13 ENCOUNTER — Emergency Department (HOSPITAL_COMMUNITY): Payer: Medicare Other

## 2023-02-13 ENCOUNTER — Encounter (HOSPITAL_COMMUNITY): Payer: Self-pay

## 2023-02-13 ENCOUNTER — Emergency Department (HOSPITAL_COMMUNITY)
Admission: EM | Admit: 2023-02-13 | Discharge: 2023-02-14 | Disposition: A | Discharge status: Home / Self Care | Payer: Medicare Other | Attending: Emergency Medicine | Admitting: Emergency Medicine

## 2023-02-13 DIAGNOSIS — S0101XA Laceration without foreign body of scalp, initial encounter: Secondary | ICD-10-CM | POA: Insufficient documentation

## 2023-02-13 DIAGNOSIS — W19XXXA Unspecified fall, initial encounter: Secondary | ICD-10-CM | POA: Diagnosis not present

## 2023-02-13 DIAGNOSIS — S199XXA Unspecified injury of neck, initial encounter: Secondary | ICD-10-CM | POA: Diagnosis not present

## 2023-02-13 DIAGNOSIS — T84020A Dislocation of internal right hip prosthesis, initial encounter: Secondary | ICD-10-CM | POA: Diagnosis not present

## 2023-02-13 DIAGNOSIS — M25551 Pain in right hip: Secondary | ICD-10-CM | POA: Diagnosis not present

## 2023-02-13 DIAGNOSIS — S0990XA Unspecified injury of head, initial encounter: Secondary | ICD-10-CM | POA: Diagnosis not present

## 2023-02-13 DIAGNOSIS — Z87891 Personal history of nicotine dependence: Secondary | ICD-10-CM | POA: Diagnosis not present

## 2023-02-13 DIAGNOSIS — Y69 Unspecified misadventure during surgical and medical care: Secondary | ICD-10-CM | POA: Diagnosis not present

## 2023-02-13 DIAGNOSIS — W07XXXA Fall from chair, initial encounter: Secondary | ICD-10-CM | POA: Diagnosis not present

## 2023-02-13 DIAGNOSIS — R9431 Abnormal electrocardiogram [ECG] [EKG]: Secondary | ICD-10-CM | POA: Diagnosis not present

## 2023-02-13 DIAGNOSIS — Z96641 Presence of right artificial hip joint: Secondary | ICD-10-CM | POA: Diagnosis not present

## 2023-02-13 DIAGNOSIS — R6889 Other general symptoms and signs: Secondary | ICD-10-CM | POA: Diagnosis not present

## 2023-02-13 DIAGNOSIS — Z23 Encounter for immunization: Secondary | ICD-10-CM | POA: Insufficient documentation

## 2023-02-13 DIAGNOSIS — S79911A Unspecified injury of right hip, initial encounter: Secondary | ICD-10-CM | POA: Diagnosis present

## 2023-02-13 DIAGNOSIS — Z7902 Long term (current) use of antithrombotics/antiplatelets: Secondary | ICD-10-CM | POA: Diagnosis not present

## 2023-02-13 DIAGNOSIS — M8580 Other specified disorders of bone density and structure, unspecified site: Secondary | ICD-10-CM | POA: Diagnosis not present

## 2023-02-13 DIAGNOSIS — R404 Transient alteration of awareness: Secondary | ICD-10-CM | POA: Diagnosis not present

## 2023-02-13 DIAGNOSIS — Z743 Need for continuous supervision: Secondary | ICD-10-CM | POA: Diagnosis not present

## 2023-02-13 DIAGNOSIS — R9089 Other abnormal findings on diagnostic imaging of central nervous system: Secondary | ICD-10-CM | POA: Diagnosis not present

## 2023-02-13 DIAGNOSIS — Z7982 Long term (current) use of aspirin: Secondary | ICD-10-CM | POA: Insufficient documentation

## 2023-02-13 DIAGNOSIS — S73004A Unspecified dislocation of right hip, initial encounter: Secondary | ICD-10-CM

## 2023-02-13 DIAGNOSIS — I6522 Occlusion and stenosis of left carotid artery: Secondary | ICD-10-CM | POA: Diagnosis not present

## 2023-02-13 MED ORDER — TETANUS-DIPHTH-ACELL PERTUSSIS 5-2.5-18.5 LF-MCG/0.5 IM SUSY
0.5000 mL | PREFILLED_SYRINGE | Freq: Once | INTRAMUSCULAR | Status: AC
  Administered 2023-02-13: 0.5 mL via INTRAMUSCULAR
  Filled 2023-02-13: qty 0.5

## 2023-02-13 MED ORDER — FENTANYL CITRATE PF 50 MCG/ML IJ SOSY
50.0000 ug | PREFILLED_SYRINGE | Freq: Once | INTRAMUSCULAR | Status: AC
  Administered 2023-02-13: 50 ug via INTRAVENOUS
  Filled 2023-02-13: qty 1

## 2023-02-13 MED ORDER — ONDANSETRON HCL 4 MG/2ML IJ SOLN
4.0000 mg | Freq: Once | INTRAMUSCULAR | Status: AC
  Administered 2023-02-13: 4 mg via INTRAVENOUS
  Filled 2023-02-13: qty 2

## 2023-02-13 MED ORDER — SODIUM CHLORIDE 0.9 % IV BOLUS
500.0000 mL | Freq: Once | INTRAVENOUS | Status: DC
  Administered 2023-02-13: 500 mL via INTRAVENOUS

## 2023-02-13 NOTE — ED Provider Notes (Signed)
[place] EMERGENCY DEPARTMENT AT [facility] Provider Note  CSN: [id number] Arrival date & time: 02/13/23 2253  Chief Complaint(s) Hip Injury  HPI Steven [name] is a 88 y.o. male with past medical history as below, significant for HLD, GERD, CVA, IDA, who presents to the ED with complaint of fall  Pt has been feeling dizzy for approx 1 month, does not feel as though worsening. Last night/this am he had a fall from chair, fell backwards and hit his head, no LOC. No other injuries. Tonight he was moving about his house with his walker, leaned over to pick up something and fell to the ground. Fell onto his right side. No rpt head injury. Pain to right hip reported that feels similar to prior dislocation. No numbness/tingling to LE. Pain localized to right hip, no other areas of discomfort reported. No LOC.  Unsure last tetanus  He was admitted late last month secondary to pneumonia, was discharged home on cefdinir 10/26 Past Medical History Past Medical History:  Diagnosis Date   Allergy    enviromental   Anemia 2017   Anxiety    Arthritis    ankylosing spodilitis   Blood transfusion without reported diagnosis    during surgery   Cataract    Depression    Dyspnea    with exertion - had Echo done 09/29/16   History of kidney stones    Hyperlipidemia    Intestinal obstruction (HCC)    Pneumonia    as a child   Patient Active Problem List   Diagnosis Date Noted   Sepsis due to pneumonia (HCC) 02/04/2023   Dyslipidemia 02/04/2023   GERD without esophagitis 02/04/2023   Community acquired pneumonia of right lung 02/04/2023   Gastroenteritis due to norovirus 07/11/2022   History of CVA (cerebrovascular accident) 07/11/2022   Iron deficiency anemia due to chronic blood loss 09/25/2021   Elevated CK 06/05/2021   Diarrhea 06/05/2021   Generalized weakness 06/04/2021   Dehydration 06/03/2021   Alcohol use 06/03/2021   CVA (cerebral vascular accident) (HCC) 03/28/2021    Stenosis of left carotid artery    TIA (transient ischemic attack) 03/27/2021   Small bowel obstruction due to adhesions (HCC) 02/29/2020   Hypokalemia    Hypomagnesemia    Ankylosing spondylitis of lumbosacral region [facility])    Depression    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 Authorizing Provider  oxyCODONE (ROXICODONE) 5 MG immediate release tablet Take 1 tablet (5 mg total) by mouth every 6 (six) hours as needed for severe pain (pain score 7-10). 02/14/23  Yes Sloan Leiter, DO  Artificial Tear Solution (SOOTHE XP) SOLN Place 1 drop into both eyes 2 (two) times daily.    [provider]  aspirin EC 325 MG EC tablet Take 1 tablet (325 mg total) by mouth daily. Patient not taking: Reported on 02/04/2023 04/02/21   Elease Etienne, MD  Biotin 5000 MCG TABS Take 5,000 mcg by mouth daily.    [provider]  buPROPion (WELLBUTRIN XL) 300 MG 24 hr tablet Take 300 mg by mouth daily.    [provider]  Calcium Carb-Cholecalciferol (CALCIUM+D3 PO) Take 2 tablets by mouth 2 (two) times daily with a meal.    [provider]  Calcium Citrate 250 MG TABS  Take 250 mg by mouth daily.    [provider]  Cholecalciferol (VITAMIN D3) 1000 units CAPS Take 1,000 Units by mouth daily.    [provider]  clopidogrel (PLAVIX) 75 MG tablet Take 1 tablet (75 mg total) by mouth daily. Patient taking differently: Take 75 mg by mouth at bedtime. 04/02/21   Hongalgi, Maximino Greenland, MD  cyanocobalamin (VITAMIN B12) 1000 MCG tablet Take 1,000 mcg by mouth daily.    [provider]  ferrous sulfate 325 (65 FE) MG tablet Take 1 tablet (325 mg total) by mouth daily with breakfast. 06/07/21 02/04/23  Zigmund Daniel., MD  fluoruracil [facility]) 0.5 %  cream Apply 1 application  topically daily as needed (for cancer treatment- as directed to affected areas).    [provider]  folic acid (FOLVITE) 1 MG tablet Take 1 mg by mouth in the morning, at noon, and at bedtime.    [provider]  GEMTESA 75 MG TABS Take 75 mg by mouth at bedtime. 06/01/22   [provider]  GOODSENSE ASPIRIN 325 MG tablet Take 325 mg by mouth daily.    [provider]  ipratropium (ATROVENT) 0.06 % nasal spray Place 2 sprays into both nostrils 2 (two) times daily as needed (allergies). 06/05/19   [provider]  methotrexate (RHEUMATREX) 2.5 MG tablet Take 20 mg by mouth every Sunday.    [provider]  mirtazapine (REMERON) 7.5 MG tablet Take 7.5 mg by mouth at bedtime. 01/25/23   [provider]  Multiple Vitamins-Minerals (PRESERVISION AREDS 2 PO) Take 1 tablet by mouth in the morning and at bedtime.    [provider]  pantoprazole (PROTONIX) 40 MG tablet Take 1 tablet (40 mg total) by mouth daily. Patient taking differently: Take 40 mg by mouth daily before breakfast. 04/02/21   Hongalgi, Maximino Greenland, MD  rosuvastatin (CRESTOR) 20 MG tablet Take 1 tablet (20 mg total) by mouth at bedtime. 04/01/21   Hongalgi, Maximino Greenland, MD  TYLENOL 8 HOUR ARTHRITIS PAIN 650 MG CR tablet Take 650 mg by mouth every 8 (eight) hours as needed for pain.    [provider]  zaleplon (SONATA) 5 MG capsule Take 5 mg by mouth at bedtime as needed for sleep. 01/11/23   [provider]                                                                                                                                    Past Surgical History Past Surgical History:  Procedure Laterality Date   ABDOMINAL SURGERY     had abcess   APPENDECTOMY     CHOLECYSTECTOMY     with lysis of adhesions   COLONOSCOPY     COLOSTOMY     COLOSTOMY REVERSAL     EYE SURGERY Left    scar tissue removed from cornea   EYE SURGERY  Right  cataract surgery with lens implant   JOINT REPLACEMENT Right    hip  x 2 1999 and 2007   SHOULDER ARTHROSCOPY WITH ROTATOR CUFF REPAIR AND SUBACROMIAL DECOMPRESSION Left 03/02/2013   Procedure: LEFT SHOULDER ARTHROSCOPY WITH SUBACROMIAL DECOMPRESSION DISTAL CALVICLE RESECTION AND ROTATOR CUFF REPAIR ;  Surgeon: Senaida Lange, MD;  Location: [facility] OR;  Service: Orthopedics;  Laterality: Left;   TOTAL HIP REVISION Right 11/06/2016   Procedure: TOTAL HIP REVISION OF THE ACETABULAR COMPONENT;  Surgeon: Gean Birchwood, MD;  Location: [facility] OR;  Service: Orthopedics;  Laterality: Right;   TRANSCAROTID ARTERY REVASCULARIZATION  Left 03/31/2021   Procedure: TRANSCAROTID ARTERY REVASCULARIZATION;  Surgeon: Victorino Sparrow, MD;  Location[facility] OR;  Service: Vascular;  Laterality: Left;   ULTRASOUND GUIDANCE FOR VASCULAR ACCESS Right 03/31/2021   Procedure: ULTRASOUND GUIDANCE FOR VASCULAR ACCESS;  Surgeon: Victorino Sparrow, MD;  Location: [facility] OR;  Service: Vascular;  Laterality: Right;   Family History Family History  Problem Relation Age of Onset   Heart attack Mother    Diabetes Mother    Breast cancer Mother    Heart attack Maternal Grandfather    Pancreatic cancer Brother    Colon cancer Neg Hx    Esophageal cancer Neg Hx    Stomach cancer Neg Hx     Social History Social History   Tobacco Use   Smoking status: Former    Current packs/day: 0.00    Average packs/day: 1 pack/day for 9.0 years (9.0 ttl pk-yrs)    Types: Cigarettes    Start date: 04/13/1956    Quit date: 04/13/1965    Years since quitting: 57.8    Passive exposure: Never   Smokeless tobacco: Never  Vaping Use   Vaping status: Never Used  Substance Use Topics   Alcohol use: Yes    Comment: 3 beers or glasses of wine a day--reports stopped ETOH 11/01/16    Drug use: No   Allergies Gluten meal, Indomethacin, and Lactose intolerance (gi)  Review of Systems Review of Systems  Constitutional:  Negative for chills and  fever.  Respiratory:  Negative for chest tightness and shortness of breath.   Cardiovascular:  Negative for chest pain and palpitations.  Gastrointestinal:  Negative for abdominal pain, nausea and vomiting.  Musculoskeletal:  Positive for arthralgias and gait problem. Negative for back pain.  Skin:  Positive for wound.  Neurological:  Positive for headaches. Negative for syncope, weakness and numbness.  All other systems reviewed and are negative.   Physical Exam Vital Signs  I have reviewed the triage vital signs BP (!) 147/69 (BP Location: Right Arm)   Pulse 68   Temp 98.4 F (36.9 C) (Oral)   Resp 16   Ht 5\' 6"  (1.676 m)   Wt 56.7 kg   SpO2 99%   BMI 20.18 kg/m  Physical Exam Vitals and nursing note reviewed.  Constitutional:      General: He is not in acute distress.    Appearance: He is well-developed.  HENT:     Head: Normocephalic. Laceration present. No raccoon eyes, Battle's sign, right periorbital erythema or left periorbital erythema.      Right Ear: External ear normal.     Left Ear: External ear normal.     Mouth/Throat:     Mouth: Mucous membranes are moist.  Eyes:     General: No scleral icterus. Neck:     Comments: C-collar Cardiovascular:     Rate and Rhythm: Normal rate and regular  rhythm.     Pulses: Normal pulses.     Heart sounds: Normal heart sounds.  Pulmonary:     Effort: Pulmonary effort is normal. No respiratory distress.     Breath sounds: Normal breath sounds.  Abdominal:     General: Abdomen is flat.     Palpations: Abdomen is soft.     Tenderness: There is no abdominal tenderness. There is no guarding or rebound.  Musculoskeletal:     Cervical back: No rigidity.     Right lower leg: No edema.     Left lower leg: No edema.       Legs:     Comments: LE NVI.  Reduced range of motion right lower extremity secondary to pain  Skin:    General: Skin is warm and dry.     Capillary Refill: Capillary refill takes less than 2 seconds.   Neurological:     Mental Status: He is alert and oriented to person, place, and time.     GCS: GCS eye subscore is 4. GCS verbal subscore is 5. GCS motor subscore is 6.  Psychiatric:        Mood and Affect: Mood normal.        Behavior: Behavior normal.     ED Results and Treatments Labs (all labs ordered are listed, but only abnormal results are displayed) Labs Reviewed  CBC WITH DIFFERENTIAL/PLATELET - Abnormal; Notable for the following components:      Result Value   RBC 2.95 (*)    Hemoglobin 9.6 (*)    HCT 29.3 (*)    All other components within normal limits  BASIC METABOLIC PANEL - Abnormal; Notable for the following components:   Calcium 7.7 (*)    All other components within normal limits                                                                                                                          Radiology No results found.  Pertinent labs & imaging results that were available during my care of the patient were reviewed by me and considered in my medical decision making (see MDM for details).  Medications Ordered in ED Medications  ondansetron (ZOFRAN) injection 4 mg (4 mg Intravenous Given 02/13/23 2345)  fentaNYL (SUBLIMAZE) injection 50 mcg (50 mcg Intravenous Given 02/13/23 2344)  Tdap (BOOSTRIX) injection 0.5 mL (0.5 mLs Intramuscular Given 02/13/23 2346)  propofol (DIPRIVAN) 10 mg/mL bolus/IV push 28.4 mg (28.4 mg Intravenous Given 02/14/23 0141)  ketamine 50 mg in normal saline 5 mL (10 mg/mL) syringe (28 mg Intravenous Given 02/14/23 0141)  lidocaine-EPINEPHrine-tetracaine (LET) topical gel (3 mLs Topical Given 02/14/23 0333)  HYDROcodone-acetaminophen (NORCO/VICODIN) 5-325 MG per tablet 1 tablet (1 tablet Oral Given 02/14/23 0402)  lidocaine (PF) (XYLOCAINE) 1 % injection 5 mL (5 mLs Other Given by Other 02/14/23 0503)  Procedures Reduction of dislocation  Date/Time: 02/15/2023 2:57 AM  Performed by: Sloan Leiter, DO Authorized by: Sloan Leiter, DO  Consent: Verbal consent obtained. Written consent obtained. Risks and benefits: risks, benefits and alternatives were discussed Consent given by: patient Patient understanding: patient states understanding of the procedure being performed Imaging studies: imaging studies available Required items: required blood products, implants, devices, and special equipment available Patient identity confirmed: verbally with patient and arm band Time out: Immediately prior to procedure a "time out" was called to verify the correct patient, procedure, equipment, support staff and site/side marked as required. Preparation: Patient was prepped and draped in the usual sterile fashion. Local anesthesia used: no  Anesthesia: Local anesthesia used: no  Sedation: Patient sedated: yes Sedatives: ketamine and propofol Sedation start date/time: 02/14/2023 1:42 AM Sedation end date/time: 02/14/2023 2:02 AM  Patient tolerance: patient tolerated the procedure well with no immediate complications   .Sedation  Date/Time: 02/15/2023 2:58 AM  Performed by: Sloan Leiter, DO Authorized by: Sloan Leiter, DO   Consent:    Consent obtained:  Verbal   Consent given by:  Patient   Risks discussed:  Allergic reaction, dysrhythmia, inadequate sedation and nausea   Alternatives discussed:  Analgesia without sedation Universal protocol:    Procedure explained and questions answered to patient or proxy's satisfaction: yes     Immediately prior to procedure, a time out was called: yes     Patient identity confirmed:  Arm band and verbally with patient Indications:    Procedure performed:  Dislocation reduction   Procedure necessitating sedation performed by:  Physician performing sedation Pre-sedation assessment:    Time since last food or drink:  5 hours   ASA  classification: class 2 - patient with mild systemic disease     Mallampati score:  I - soft palate, uvula, fauces, pillars visible   Neck mobility: normal     Pre-sedation assessments completed and reviewed: airway patency, cardiovascular function, hydration status, mental status, nausea/vomiting, pain level, respiratory function and temperature     Pre-sedation assessment completed:  02/14/2023 1:30 AM Immediate pre-procedure details:    Reassessment: Patient reassessed immediately prior to procedure     Reviewed: vital signs, relevant labs/tests and NPO status     Verified: bag valve mask available, emergency equipment available, intubation equipment available and IV patency confirmed   Procedure details (see MAR for exact dosages):    Preoxygenation:  Nasal cannula   Sedation:  Propofol and ketamine   Intended level of sedation: deep   Intra-procedure monitoring:  Blood pressure monitoring, cardiac monitor, continuous pulse oximetry, continuous capnometry, frequent LOC assessments and frequent vital sign checks   Intra-procedure events: none     Total Provider sedation time (minutes):  20 Post-procedure details:    Post-sedation assessment completed:  02/14/2023 2:05 AM   Attendance: Constant attendance by certified staff until patient recovered     Recovery: Patient returned to pre-procedure baseline     Post-sedation assessments completed and reviewed: airway patency, cardiovascular function, hydration status, mental status, nausea/vomiting, pain level, respiratory function and temperature     Patient is stable for discharge or admission: yes     Procedure completion:  Tolerated well, no immediate complications   (including critical care time)  Medical Decision Making / ED Course    Medical Decision Making:    DRE GAMINO is a 88 y.o. male with past medical history as below, significant for HLD, GERD, CVA, IDA, who presents to  the ED with complaint of fall. The complaint  involves an extensive differential diagnosis and also carries with it a high risk of complications and morbidity.  Serious etiology was considered. Ddx includes but is not limited to: Differential diagnoses for head trauma includes subdural hematoma, epidural hematoma, acute concussion, traumatic subarachnoid hemorrhage, cerebral contusions, fracture, dislocation, contusion etc.   Complete initial physical exam performed, notably the patient  was no acute distress, no hypoxia, neuroexam is nonfocal.    Reviewed and confirmed nursing documentation for past medical history, family history, social history.  Vital signs reviewed.    Clinical Course as of 02/15/23 0301  Wynelle Link Feb 14, 2023  0005 Hip dislocated on wet read  [SG]  0131 Hemoglobin(!): 9.6 Similar to prior week ago, denies any ongoing bleeding  [SG]  0327 Post reduction xr reviewed, successful, no overt fx [SG]    Clinical Course User Index [SG] Tanda Rockers A, DO     With ongoing dizziness for around a month.  He is currently not dizzy.  Had extensive w/u for dizziness but inconclusive per pt. Had 2 falls in the last 24 hours.  Fall last night/early am with head injury and laceration.  Fall this evening with right hip injury.  Will order imaging, screening labs, update tetanus, analgesia.  LE NVI   Reduction was successful, see procedure note.  He has scalp laceration.  Injuries right around the 24-hour mark.  Scalp wound is gaping.  Will irrigate copiously and plan for loose approximation.  See note from PA for wound closure.  Patient placed in knee immobilizer, he will call orthopedics on Monday for follow-up.  He has walker at home.  Wife will take him home  Provided concussion instructions/precautions, wound care instructions, instructions regarding use of the knee immobilizer/dislocation precautions     The patient improved significantly and was discharged in stable condition. Detailed discussions were had with the patient  regarding current findings, and need for close f/u with PCP or on call doctor. The patient has been instructed to return immediately if the symptoms worsen in any way for re-evaluation. Patient verbalized understanding and is in agreement with current care plan. All questions answered prior to discharge.                  Additional history obtained: -Additional history obtained from na -External records from outside source obtained and reviewed including: Chart review including previous notes, labs, imaging, consultation notes including  PDMP, prior admission, primary care documentation   Lab Tests: -I ordered, reviewed, and interpreted labs.   The pertinent results include:   Labs Reviewed  CBC WITH DIFFERENTIAL/PLATELET - Abnormal; Notable for the following components:      Result Value   RBC 2.95 (*)    Hemoglobin 9.6 (*)    HCT 29.3 (*)    All other components within normal limits  BASIC METABOLIC PANEL - Abnormal; Notable for the following components:   Calcium 7.7 (*)    All other components within normal limits    Notable for hemoglobin/calcium similar to prior  EKG   EKG Interpretation Date/Time:  Saturday February 13 2023 23:48:41 EDT Ventricular Rate:  71 PR Interval:  216 QRS Duration:  138 QT Interval:  449 QTC Calculation: 488 R Axis:   61  Text Interpretation: Sinus rhythm Borderline prolonged PR interval Consider right atrial enlargement Right bundle branch block Confirmed by Tanda Rockers ([id number]) on 02/14/2023 1:32:01 AM         Imaging  Studies ordered: I ordered imaging studies including head, cervical spine, hip and pelvis I independently visualized the following imaging with scope of interpretation limited to determining acute life threatening conditions related to emergency care; findings noted above I independently visualized and interpreted imaging. I agree with the radiologist interpretation   Medicines ordered and prescription drug  management: Meds ordered this encounter  Medications   DISCONTD: sodium chloride 0.9 % bolus 500 mL   ondansetron (ZOFRAN) injection 4 mg   fentaNYL (SUBLIMAZE) injection 50 mcg   Tdap (BOOSTRIX) injection 0.5 mL   propofol (DIPRIVAN) 10 mg/mL bolus/IV push 28.4 mg   ketamine 50 mg in normal saline 5 mL (10 mg/mL) syringe   lidocaine-EPINEPHrine-tetracaine (LET) topical gel   HYDROcodone-acetaminophen (NORCO/VICODIN) 5-325 MG per tablet 1 tablet   lidocaine (PF) (XYLOCAINE) 1 % injection 5 mL   oxyCODONE (ROXICODONE) 5 MG immediate release tablet    Sig: Take 1 tablet (5 mg total) by mouth every 6 (six) hours as needed for severe pain (pain score 7-10).    Dispense:  10 tablet    Refill:  0    -I have reviewed the patients home medicines and have made adjustments as needed   Consultations Obtained: Not applicable  Cardiac Monitoring: The patient was maintained on a cardiac monitor.  I personally viewed and interpreted the cardiac monitored which showed an underlying rhythm of: NSR Continuous pulse oximetry interpreted by myself, 99% on RA.    Social Determinants of Health:  Diagnosis or treatment significantly limited by social determinants of health: former smoker   Reevaluation: After the interventions noted above, I reevaluated the patient and found that they have improved  Co morbidities that complicate the patient evaluation  Past Medical History:  Diagnosis Date   Allergy    enviromental   Anemia 2017   Anxiety    Arthritis    ankylosing spodilitis   Blood transfusion without reported diagnosis    during surgery   Cataract    Depression    Dyspnea    with exertion - had Echo done 09/29/16   History of kidney stones    Hyperlipidemia    Intestinal obstruction (HCC)    Pneumonia    as a child      Dispostion: Disposition decision including need for hospitalization was considered, and patient discharged from emergency department.    Final Clinical  Impression(s) / ED Diagnoses Final diagnoses:  Dislocation of right hip, initial encounter [facility])  Laceration of scalp, initial encounter  Injury of head, initial encounter        Sloan Leiter, DO 02/15/23 0302

## 2023-02-13 NOTE — ED Triage Notes (Signed)
Pt from home c/o rt hip pain after falling backwards at home. Also fell yesterday and not seen in ED, states he hit his head yesterday, pt on Plavix. C/O ling standing dizzyness. Obvious deformity rt hip. Given fentanyl by EMS. C Collar in place. Abrasion to occiptal area

## 2023-02-14 ENCOUNTER — Emergency Department (HOSPITAL_COMMUNITY): Payer: Medicare Other

## 2023-02-14 DIAGNOSIS — S0990XA Unspecified injury of head, initial encounter: Secondary | ICD-10-CM | POA: Diagnosis not present

## 2023-02-14 DIAGNOSIS — M25551 Pain in right hip: Secondary | ICD-10-CM | POA: Diagnosis not present

## 2023-02-14 DIAGNOSIS — I6522 Occlusion and stenosis of left carotid artery: Secondary | ICD-10-CM | POA: Diagnosis not present

## 2023-02-14 DIAGNOSIS — R9089 Other abnormal findings on diagnostic imaging of central nervous system: Secondary | ICD-10-CM | POA: Diagnosis not present

## 2023-02-14 DIAGNOSIS — T84020A Dislocation of internal right hip prosthesis, initial encounter: Secondary | ICD-10-CM | POA: Diagnosis not present

## 2023-02-14 DIAGNOSIS — Z96641 Presence of right artificial hip joint: Secondary | ICD-10-CM | POA: Diagnosis not present

## 2023-02-14 DIAGNOSIS — M8580 Other specified disorders of bone density and structure, unspecified site: Secondary | ICD-10-CM | POA: Diagnosis not present

## 2023-02-14 DIAGNOSIS — S838X1A Sprain of other specified parts of right knee, initial encounter: Secondary | ICD-10-CM | POA: Diagnosis not present

## 2023-02-14 DIAGNOSIS — S199XXA Unspecified injury of neck, initial encounter: Secondary | ICD-10-CM | POA: Diagnosis not present

## 2023-02-14 LAB — CBC WITH DIFFERENTIAL/PLATELET
Abs Immature Granulocytes: 0.05 10*3/uL (ref 0.00–0.07)
Basophils Absolute: 0.1 10*3/uL (ref 0.0–0.1)
Basophils Relative: 1 %
Eosinophils Absolute: 0.3 10*3/uL (ref 0.0–0.5)
Eosinophils Relative: 4 %
HCT: 29.3 % — ABNORMAL LOW (ref 39.0–52.0)
Hemoglobin: 9.6 g/dL — ABNORMAL LOW (ref 13.0–17.0)
Immature Granulocytes: 1 %
Lymphocytes Relative: 16 %
Lymphs Abs: 1 10*3/uL (ref 0.7–4.0)
MCH: 32.5 pg (ref 26.0–34.0)
MCHC: 32.8 g/dL (ref 30.0–36.0)
MCV: 99.3 fL (ref 80.0–100.0)
Monocytes Absolute: 0.5 10*3/uL (ref 0.1–1.0)
Monocytes Relative: 8 %
Neutro Abs: 4.7 10*3/uL (ref 1.7–7.7)
Neutrophils Relative %: 70 %
Platelets: 367 10*3/uL (ref 150–400)
RBC: 2.95 MIL/uL — ABNORMAL LOW (ref 4.22–5.81)
RDW: 13.2 % (ref 11.5–15.5)
WBC: 6.6 10*3/uL (ref 4.0–10.5)
nRBC: 0 % (ref 0.0–0.2)

## 2023-02-14 LAB — BASIC METABOLIC PANEL
Anion gap: 8 (ref 5–15)
BUN: 16 mg/dL (ref 8–23)
CO2: 24 mmol/L (ref 22–32)
Calcium: 7.7 mg/dL — ABNORMAL LOW (ref 8.9–10.3)
Chloride: 107 mmol/L (ref 98–111)
Creatinine, Ser: 0.68 mg/dL (ref 0.61–1.24)
GFR, Estimated: >60 mL/min (ref >60)
Glucose, Bld: 98 mg/dL (ref 70–99)
Potassium: 4.4 mmol/L (ref 3.5–5.1)
Sodium: 139 mmol/L (ref 135–145)

## 2023-02-14 MED ORDER — LIDOCAINE-EPINEPHRINE-TETRACAINE (LET) TOPICAL GEL
3.0000 mL | Freq: Once | TOPICAL | Status: AC
  Administered 2023-02-14: 3 mL via TOPICAL
  Filled 2023-02-14: qty 3

## 2023-02-14 MED ORDER — OXYCODONE HCL 5 MG PO TABS: 5.0000 mg | ORAL_TABLET | Freq: Four times a day (QID) | ORAL | 0 refills | Status: DC | PRN

## 2023-02-14 MED ORDER — HYDROCODONE-ACETAMINOPHEN 5-325 MG PO TABS
1.0000 | ORAL_TABLET | Freq: Once | ORAL | Status: AC
  Administered 2023-02-14: 1 via ORAL
  Filled 2023-02-14: qty 1

## 2023-02-14 MED ORDER — PROPOFOL 10 MG/ML IV BOLUS
0.5000 mg/kg | Freq: Once | INTRAVENOUS | Status: AC
  Administered 2023-02-14: 28.4 mg via INTRAVENOUS
  Filled 2023-02-14: qty 20

## 2023-02-14 MED ORDER — LIDOCAINE HCL (PF) 1 % IJ SOLN
5.0000 mL | Freq: Once | INTRAMUSCULAR | Status: AC
  Administered 2023-02-14: 5 mL
  Filled 2023-02-14: qty 5

## 2023-02-14 MED ORDER — KETAMINE HCL 50 MG/5ML IJ SOSY
0.5000 mg/kg | PREFILLED_SYRINGE | Freq: Once | INTRAMUSCULAR | Status: AC
  Administered 2023-02-14: 28 mg via INTRAVENOUS
  Filled 2023-02-14: qty 5

## 2023-02-14 NOTE — Progress Notes (Signed)
Orthopedic Tech Progress Note Patient Details:  Steven [name] 01-Jun-1934 [id number]  Ortho Devices Type of Ortho Device: Knee Immobilizer Ortho Device/Splint Location: rle Ortho Device/Splint Interventions: Ordered, Application, Adjustment  I applied brace post reduction. Post Interventions Patient Tolerated: Well Instructions Provided: Care of device, Adjustment of device  Trinna Post 02/14/2023, 2:56 AM

## 2023-02-14 NOTE — ED Provider Notes (Signed)
..  Laceration Repair  Date/Time: 02/14/2023 6:34 AM  Performed by: Halford Decamp, PA-C Authorized by: Halford Decamp, PA-C   Consent:    Consent obtained:  Verbal   Consent given by:  Patient   Risks, benefits, and alternatives were discussed: yes     Risks discussed:  Infection, pain and poor wound healing   Alternatives discussed:  No treatment Universal protocol:    Relevant documents present and verified: yes     Test results available: yes     Required blood products, implants, devices, and special equipment available: yes     Site/side marked: yes     Immediately prior to procedure, a time out was called: yes     Patient identity confirmed:  Verbally with patient and arm band Anesthesia:    Anesthesia method:  Local infiltration and topical application   Topical anesthetic:  LET   Local anesthetic:  Lidocaine 1% w/o epi Laceration details:    Location:  Scalp   Length (cm):  6 Pre-procedure details:    Preparation:  Patient was prepped and draped in usual sterile fashion Exploration:    Hemostasis achieved with:  Direct pressure   Contaminated: no   Treatment:    Area cleansed with:  Chlorhexidine   Amount of cleaning:  Standard   Debridement:  None Skin repair:    Repair method:  Sutures   Suture size:  4-0   Suture material:  Nylon Approximation:    Approximation:  Close Repair type:    Repair type:  Simple Post-procedure details:    Dressing:  Adhesive bandage   Procedure completion:  Ventura Bruns, PA-C 02/14/23 0636    Sloan Leiter, DO 02/14/23 0736    Sloan Leiter, DO 02/15/23 0302

## 2023-02-14 NOTE — ED Notes (Signed)
Ptar called, no eta 

## 2023-02-14 NOTE — ED Notes (Signed)
Wife at bedside to transport pt home.

## 2023-02-14 NOTE — Discharge Instructions (Addendum)
Please follow-up with PCP, urgent care or emergency department have sutures removed in the next 7 to 10 days.  Please keep the affected area clean and dry.  Clean with gentle soap and water twice daily.  Avoid submerging your head underwater until the wound has healed.  Please wear knee immobilizer and use walker until cleared by orthopedics, please call them on Monday to arrange follow-up visit in regards to your hip dislocation. Be very careful with your movements over the next few days, avoid bending over if possible and be careful when sitting down/standing up.    Based on the events which brought you to the ER today, it is possible that you may have a concussion. A concussion occurs when there is a blow to the head or body, with enough force to shake the brain and disrupt how the brain functions. You may experience symptoms such as headaches, sensitivity to light/noise, dizziness, cognitive slowing, difficulty concentrating / remembering, trouble sleeping and drowsiness. These symptoms may last anywhere from hours/days to potentially weeks/months. While these symptoms are very frustrating and perhaps debilitating, it is important that you remember that they will improve over time. Everyone has a different rate of recovery; it is difficult to predict when your symptoms will resolve. In order to allow for your brain to heal after the injury, we recommend that you see your primary physician or a physician knowledgeable in concussion management. We also advise you to let your body and brain rest: avoid physical activities (sports, gym, and exercise) and reduce cognitive demands (reading, texting, TV watching, computer use, video games, etc). School attendance, after-school activities and work may need to be modified to avoid increasing symptoms. We recommend against driving until until all symptoms have resolved. Come back to the ER right away if you are having repeated episodes of vomiting, severe/worsening  headache/dizziness or any other symptom that alarms you. We recommended that someone stay with you for the next 24 hours to monitor for these worrisome symptoms.     It was a pleasure caring for you today in the emergency department.  Please return to the emergency department for any worsening or worrisome symptoms.

## 2023-02-14 NOTE — ED Notes (Signed)
Scalp wound irrigated and cleaned with normal saline.

## 2023-02-14 NOTE — ED Notes (Signed)
Attempted to call wife, left VM

## 2023-02-16 ENCOUNTER — Emergency Department (HOSPITAL_COMMUNITY)
Admission: EM | Admit: 2023-02-16 | Discharge: 2023-02-16 | Disposition: A | Discharge status: Home / Self Care | Payer: Medicare Other | Attending: Emergency Medicine | Admitting: Emergency Medicine

## 2023-02-16 ENCOUNTER — Emergency Department (HOSPITAL_COMMUNITY): Payer: Medicare Other

## 2023-02-16 ENCOUNTER — Encounter (HOSPITAL_COMMUNITY): Payer: Self-pay | Admitting: Emergency Medicine

## 2023-02-16 DIAGNOSIS — R9431 Abnormal electrocardiogram [ECG] [EKG]: Secondary | ICD-10-CM | POA: Diagnosis not present

## 2023-02-16 DIAGNOSIS — Z7902 Long term (current) use of antithrombotics/antiplatelets: Secondary | ICD-10-CM | POA: Insufficient documentation

## 2023-02-16 DIAGNOSIS — Z471 Aftercare following joint replacement surgery: Secondary | ICD-10-CM | POA: Diagnosis not present

## 2023-02-16 DIAGNOSIS — S73004A Unspecified dislocation of right hip, initial encounter: Secondary | ICD-10-CM | POA: Diagnosis not present

## 2023-02-16 DIAGNOSIS — W19XXXA Unspecified fall, initial encounter: Secondary | ICD-10-CM | POA: Insufficient documentation

## 2023-02-16 DIAGNOSIS — T84020A Dislocation of internal right hip prosthesis, initial encounter: Secondary | ICD-10-CM | POA: Diagnosis not present

## 2023-02-16 DIAGNOSIS — R6889 Other general symptoms and signs: Secondary | ICD-10-CM | POA: Diagnosis not present

## 2023-02-16 DIAGNOSIS — M1612 Unilateral primary osteoarthritis, left hip: Secondary | ICD-10-CM | POA: Diagnosis not present

## 2023-02-16 DIAGNOSIS — M1711 Unilateral primary osteoarthritis, right knee: Secondary | ICD-10-CM | POA: Diagnosis not present

## 2023-02-16 DIAGNOSIS — Z7982 Long term (current) use of aspirin: Secondary | ICD-10-CM | POA: Diagnosis not present

## 2023-02-16 DIAGNOSIS — Z96641 Presence of right artificial hip joint: Secondary | ICD-10-CM | POA: Diagnosis not present

## 2023-02-16 DIAGNOSIS — S79911A Unspecified injury of right hip, initial encounter: Secondary | ICD-10-CM | POA: Diagnosis present

## 2023-02-16 LAB — CBC WITH DIFFERENTIAL/PLATELET
Abs Immature Granulocytes: 0.06 10*3/uL (ref 0.00–0.07)
Basophils Absolute: 0 10*3/uL (ref 0.0–0.1)
Basophils Relative: 0 %
Eosinophils Absolute: 0 10*3/uL (ref 0.0–0.5)
Eosinophils Relative: 0 %
HCT: 32.3 % — ABNORMAL LOW (ref 39.0–52.0)
Hemoglobin: 10.5 g/dL — ABNORMAL LOW (ref 13.0–17.0)
Immature Granulocytes: 1 %
Lymphocytes Relative: 5 %
Lymphs Abs: 0.4 10*3/uL — ABNORMAL LOW (ref 0.7–4.0)
MCH: 32.9 pg (ref 26.0–34.0)
MCHC: 32.5 g/dL (ref 30.0–36.0)
MCV: 101.3 fL — ABNORMAL HIGH (ref 80.0–100.0)
Monocytes Absolute: 0.1 10*3/uL (ref 0.1–1.0)
Monocytes Relative: 1 %
Neutro Abs: 8.5 10*3/uL — ABNORMAL HIGH (ref 1.7–7.7)
Neutrophils Relative %: 93 %
Platelets: 478 10*3/uL — ABNORMAL HIGH (ref 150–400)
RBC: 3.19 MIL/uL — ABNORMAL LOW (ref 4.22–5.81)
RDW: 13.4 % (ref 11.5–15.5)
WBC: 9.2 10*3/uL (ref 4.0–10.5)
nRBC: 0 % (ref 0.0–0.2)

## 2023-02-16 LAB — BASIC METABOLIC PANEL
Anion gap: 8 (ref 5–15)
BUN: 16 mg/dL (ref 8–23)
CO2: 23 mmol/L (ref 22–32)
Calcium: 8.4 mg/dL — ABNORMAL LOW (ref 8.9–10.3)
Chloride: 104 mmol/L (ref 98–111)
Creatinine, Ser: 0.81 mg/dL (ref 0.61–1.24)
GFR, Estimated: >60 mL/min (ref >60)
Glucose, Bld: 190 mg/dL — ABNORMAL HIGH (ref 70–99)
Potassium: 6 mmol/L — ABNORMAL HIGH (ref 3.5–5.1)
Sodium: 135 mmol/L (ref 135–145)

## 2023-02-16 MED ORDER — FENTANYL CITRATE PF 50 MCG/ML IJ SOSY
50.0000 ug | PREFILLED_SYRINGE | Freq: Once | INTRAMUSCULAR | Status: AC
  Administered 2023-02-16: 50 ug via INTRAVENOUS
  Filled 2023-02-16: qty 1

## 2023-02-16 MED ORDER — KETAMINE HCL 50 MG/5ML IJ SOSY
0.5000 mg/kg | PREFILLED_SYRINGE | Freq: Once | INTRAMUSCULAR | Status: AC
  Administered 2023-02-16: 28 mg via INTRAVENOUS
  Filled 2023-02-16: qty 5

## 2023-02-16 MED ORDER — PROPOFOL 10 MG/ML IV BOLUS
0.5000 mg/kg | Freq: Once | INTRAVENOUS | Status: AC
  Administered 2023-02-16: 28.4 mg via INTRAVENOUS

## 2023-02-16 NOTE — ED Triage Notes (Signed)
Pt BIB EMS from home, c/o right hip pain. Per EMS: pt stood up on Saturday and heard a pop. Recently treated for rt hip dislocation. 150 mcg, 4 mg zofran  BP 140/90 P 80 SpO2 95% 20 RFA

## 2023-02-16 NOTE — ED Provider Notes (Signed)
[facility] AT [facility] Provider Note   CSN: [id number] Arrival date & time: 02/16/23  1745     History  Chief Complaint  Patient presents with   Hip Pain    KOLIN ERDAHL is a 88 y.o. male.  With a history of CVA/TIA, status post right total hip replacement who presents to the ED for right hip pain.  Was seen at the [facility] on November 2 for a right hip dislocation after fall.  It was reduced under procedural sedation he was sent home in an immobilizer.  Attempted to stand up from the toilet today and felt a pop in his right hip again which feels similar to prior dislocations.  No other injuries were sustained   Hip Pain       Home Medications Prior to Admission medications   Medication Sig Start Date End Date Taking? Authorizing Provider  Artificial Tear Solution (SOOTHE XP) SOLN Place 1 drop into both eyes 2 (two) times daily.    [provider]  aspirin EC 325 MG EC tablet Take 1 tablet (325 mg total) by mouth daily. Patient not taking: Reported on 02/04/2023 04/02/21   Elease Etienne, MD  Biotin 5000 MCG TABS Take 5,000 mcg by mouth daily.    [provider]  buPROPion (WELLBUTRIN XL) 300 MG 24 hr tablet Take 300 mg by mouth daily.    [provider]  Calcium Carb-Cholecalciferol (CALCIUM+D3 PO) Take 2 tablets by mouth 2 (two) times daily with a meal.    [provider]  Calcium Citrate 250 MG TABS Take 250 mg by mouth daily.    [provider]  Cholecalciferol (VITAMIN D3) 1000 units CAPS Take 1,000 Units by mouth daily.    [provider]  clopidogrel (PLAVIX) 75 MG tablet Take 1 tablet (75 mg total) by mouth daily. Patient taking differently: Take 75 mg by mouth at bedtime. 04/02/21   Hongalgi, Maximino Greenland, MD  cyanocobalamin (VITAMIN B12) 1000 MCG tablet Take 1,000 mcg by mouth daily.    [provider]  ferrous sulfate 325 (65 FE) MG tablet Take 1 tablet (325 mg  total) by mouth daily with breakfast. 06/07/21 02/04/23  Zigmund Daniel., MD  fluoruracil [facility]) 0.5 % cream Apply 1 application  topically daily as needed (for cancer treatment- as directed to affected areas).    [provider]  folic acid (FOLVITE) 1 MG tablet Take 1 mg by mouth in the morning, at noon, and at bedtime.    [provider]  GEMTESA 75 MG TABS Take 75 mg by mouth at bedtime. 06/01/22   [provider]  GOODSENSE ASPIRIN 325 MG tablet Take 325 mg by mouth daily.    [provider]  ipratropium (ATROVENT) 0.06 % nasal spray Place 2 sprays into both nostrils 2 (two) times daily as needed (allergies). 06/05/19   [provider]  methotrexate (RHEUMATREX) 2.5 MG tablet Take 20 mg by mouth every Sunday.    [provider]  mirtazapine (REMERON) 7.5 MG tablet Take 7.5 mg by mouth at bedtime. 01/25/23   [provider]  Multiple Vitamins-Minerals (PRESERVISION AREDS 2 PO) Take 1 tablet by mouth in the morning and at bedtime.    [provider]  oxyCODONE (ROXICODONE) 5 MG immediate release tablet Take 1 tablet (5 mg total) by mouth every 6 (six) hours as needed for severe pain (pain score 7-10). 02/14/23   Sloan Leiter, DO  pantoprazole (PROTONIX) 40 MG tablet Take 1 tablet (40 mg total) by mouth daily. Patient taking differently: Take 40 mg by mouth daily before breakfast. 04/02/21   Hongalgi, Maximino Greenland, MD  rosuvastatin (CRESTOR) 20 MG tablet Take 1 tablet (20 mg total) by mouth at bedtime. 04/01/21   Hongalgi, Maximino Greenland, MD  TYLENOL 8 HOUR ARTHRITIS PAIN 650 MG CR tablet Take 650 mg by mouth every 8 (eight) hours as needed for pain.    [provider]  zaleplon (SONATA) 5 MG capsule Take 5 mg by mouth at bedtime as needed for sleep. 01/11/23   [provider]      Allergies    Gluten meal, Indomethacin, and Lactose intolerance (gi)    Review of Systems   Review of Systems  Physical  Exam Updated Vital Signs BP (!) 147/79   Pulse 69   Temp 98.2 F (36.8 C) (Oral)   Resp 18   Ht 5\' 6"  (1.676 m)   Wt 56.7 kg   SpO2 100%   BMI 20.18 kg/m  Physical Exam Vitals and nursing note reviewed.  HENT:     Head: Normocephalic and atraumatic.  Eyes:     Pupils: Pupils are equal, round, and reactive to light.  Cardiovascular:     Rate and Rhythm: Normal rate and regular rhythm.  Pulmonary:     Effort: Pulmonary effort is normal.     Breath sounds: Normal breath sounds.  Abdominal:     Palpations: Abdomen is soft.     Tenderness: There is no abdominal tenderness.  Musculoskeletal:     Comments: Shortening and internal rotation of right lower extremity with tenderness over anterior/lateral right hip and obvious deformity  Skin:    General: Skin is warm and dry.  Neurological:     Mental Status: He is alert.  Psychiatric:        Mood and Affect: Mood normal.     ED Results / Procedures / Treatments   Labs (all labs ordered are listed, but only abnormal results are displayed) Labs Reviewed  BASIC METABOLIC PANEL - Abnormal; Notable for the following components:      Result Value   Potassium 6.0 (*)    Glucose, Bld 190 (*)    Calcium 8.4 (*)    All other components within normal limits  CBC WITH DIFFERENTIAL/PLATELET - Abnormal; Notable for the following components:   RBC 3.19 (*)    Hemoglobin 10.5 (*)    HCT 32.3 (*)    MCV 101.3 (*)    Platelets 478 (*)    Neutro Abs 8.5 (*)    Lymphs Abs 0.4 (*)    All other components within normal limits    EKG EKG Interpretation Date/Time:  Tuesday February 16 2023 18:10:49 EST Ventricular Rate:  71 PR Interval:  254 QRS Duration:  133 QT Interval:  412 QTC Calculation: 448 R Axis:   53  Text Interpretation: Sinus rhythm Prolonged PR interval Right bundle branch block Confirmed by Estelle June [phone]) on 02/16/2023 9:04:32 PM  Radiology DG Pelvis Portable  Result Date: 02/16/2023 CLINICAL DATA:   Reduction of right hip dislocation EXAM: PORTABLE PELVIS 1-2 VIEWS COMPARISON:  02/16/2023 at 6:36 p.m. FINDINGS: Frontal view of the pelvis was obtained at 8:14 p.m. There is anatomic alignment of the right hip arthroplasty after reduction. No evidence of acute fracture. Stable left hip osteoarthritis. IMPRESSION: 1. Reduction of prior right hip arthroplasty dislocation, with anatomic alignment on this exam. No acute fracture. Electronically  Signed   By: Sharlet Salina M.D.   On: 02/16/2023 20:57   DG Femur 1 View Right  Result Date: 02/16/2023 CLINICAL DATA:  History of right hip dislocation EXAM: RIGHT FEMUR 1 VIEW COMPARISON:  02/13/2023 FINDINGS: Cross-table lateral views of the right femur are obtained. Evaluation of the right hip is limited due to overlapping structures. Right knee osteoarthritis is noted. The soft tissues are normal. IMPRESSION: 1. Nondiagnostic cross-table lateral view of the right hip due to overlying structures. Please see corresponding pelvic x-ray. 2. Right knee osteoarthritis. Electronically Signed   By: Sharlet Salina M.D.   On: 02/16/2023 20:56   DG Pelvis Portable  Result Date: 02/16/2023 CLINICAL DATA:  Clinical concern for dislocation or fracture. EXAM: PORTABLE PELVIS 1-2 VIEWS COMPARISON:  02/14/2023. FINDINGS: Total arthroplasty changes are noted on the right. There is superior dislocation of the right femoral head component. No evidence of acute fracture or hardware loosening is seen. Moderate degenerative changes are noted at the left hip. There are degenerative changes in the lower lumbar spine with findings suggestive of ankylosing spondylitis. IMPRESSION: Status post total hip arthroplasty on the right with superior dislocation of the femoral head component. Electronically Signed   By: Thornell Sartorius M.D.   On: 02/16/2023 20:55    Procedures .Sedation  Date/Time: 02/16/2023 8:07 PM  Performed by: Royanne Foots, DO Authorized by: Royanne Foots, DO    Consent:    Consent obtained:  Verbal   Consent given by:  Patient   Risks discussed:  Allergic reaction, dysrhythmia, nausea, vomiting, prolonged sedation necessitating reversal, respiratory compromise necessitating ventilatory assistance and intubation, prolonged hypoxia resulting in organ damage and inadequate sedation   Alternatives discussed:  Analgesia without sedation and anxiolysis Universal protocol:    Procedure explained and questions answered to patient or proxy's satisfaction: yes     Relevant documents present and verified: yes     Test results available: yes     Imaging studies available: yes     Required blood products, implants, devices, and special equipment available: yes     Site/side marked: yes     Immediately prior to procedure, a time out was called: yes     Patient identity confirmed:  Anonymous protocol, patient vented/unresponsive, verbally with patient, provided demographic data and arm band Indications:    Procedure performed:  Dislocation reduction   Procedure necessitating sedation performed by:  Physician performing sedation Pre-sedation assessment:    Time since last food or drink:  1200   NPO status caution: urgency dictates proceeding with non-ideal NPO status     ASA classification: class 2 - patient with mild systemic disease     Mouth opening:  3 or more finger widths   Mallampati score:  I - soft palate, uvula, fauces, pillars visible   Neck mobility: normal     Pre-sedation assessments completed and reviewed: pre-procedure airway patency not reviewed, pre-procedure cardiovascular function not reviewed, pre-procedure mental status not reviewed, pre-procedure nausea and vomiting status not reviewed, pre-procedure pain level not reviewed and pre-procedure respiratory function not reviewed   Immediate pre-procedure details:    Reassessment: Patient reassessed immediately prior to procedure     Reviewed: vital signs     Verified: bag valve mask  available, emergency equipment available, intubation equipment available, IV patency confirmed, oxygen available and suction available   Procedure details (see MAR for exact dosages):    Preoxygenation:  Nasal cannula   Sedation:  Propofol and ketamine   Intended  level of sedation: deep   Analgesia:  Fentanyl   Intra-procedure monitoring:  Blood pressure monitoring, cardiac monitor, continuous capnometry, frequent vital sign checks and continuous pulse oximetry   Intra-procedure events: none     Total Provider sedation time (minutes):  11 Post-procedure details:    Post-sedation assessment completed:  02/16/2023 8:09 PM   Attendance: Constant attendance by certified staff until patient recovered     Recovery: Patient returned to pre-procedure baseline     Post-sedation assessments completed and reviewed: post-procedure airway patency not reviewed, post-procedure cardiovascular function not reviewed, post-procedure mental status not reviewed, post-procedure nausea and vomiting status not reviewed, pain score not reviewed and post-procedure respiratory function not reviewed     Patient is stable for discharge or admission: yes     Procedure completion:  Tolerated well, no immediate complications Reduction of dislocation  Date/Time: 02/16/2023 8:09 PM  Performed by: Royanne Foots, DO Authorized by: Royanne Foots, DO  Consent: Verbal consent obtained. Written consent obtained. Risks and benefits: risks, benefits and alternatives were discussed Consent given by: patient Patient understanding: patient states understanding of the procedure being performed Patient consent: the patient's understanding of the procedure matches consent given Procedure consent: procedure consent matches procedure scheduled Relevant documents: relevant documents present and verified Test results: test results available and properly labeled Site marked: the operative site was marked Imaging studies: imaging  studies available Patient identity confirmed: verbally with patient, arm band, provided demographic data and hospital-assigned identification number Time out: Immediately prior to procedure a "time out" was called to verify the correct patient, procedure, equipment, support staff and site/side marked as required. Preparation: Patient was prepped and draped in the usual sterile fashion. Local anesthesia used: no  Anesthesia: Local anesthesia used: no  Sedation: Patient sedated: yes Sedatives: ketamine and propofol Analgesia: fentanyl Sedation start date/time: 02/16/2023 7:58 PM Sedation end date/time: 02/16/2023 8:09 PM  Patient tolerance: patient tolerated the procedure well with no immediate complications       Medications Ordered in ED Medications  ketamine 50 mg in normal saline 5 mL (10 mg/mL) syringe (28 mg Intravenous Given 02/16/23 1954)  propofol (DIPRIVAN) 10 mg/mL bolus/IV push 28.4 mg (28.4 mg Intravenous Given 02/16/23 1955)  fentaNYL (SUBLIMAZE) injection 50 mcg (50 mcg Intravenous Given 02/16/23 1953)    ED Course/ Medical Decision Making/ A&P Clinical Course as of 02/16/23 2140  Tue Feb 16, 2023  2137 Successful reduction of right hip dislocation.  Confirmed with postreduction x-ray.  Patient has remained stable at this time.  Knee immobilizer in place.  Discussed with Dr. Eulah Pont (orthopedics) who recommends discharge home with knee immobilizer and follow-up in his office early tomorrow morning.  Patient amenable with this plan and stable for discharge under the care of his wife at home [MP]    Clinical Course User Index [MP] Royanne Foots, DO                                 Medical Decision Making 88 year old male with history as above presenting for recurrent right hip dislocation.  Reviewed EMR including ED note from November 2 from [facility] ED visit after fall.  Right hip pain at that time.  X-ray revealed dislocated right hip which was reduced under procedural  sedation.  He was discharged in a knee immobilizer.  He attempted to stand from toilet today when he felt the hip "pop" and dislocate again.  No  other injuries were sustained.  X-ray reveals recurrent proximal right hip dislocation.  Plan for another reduction under procedural sedation with propofol and ketamine here in the ED tonight  Amount and/or Complexity of Data Reviewed Labs: ordered. Radiology: ordered.  Risk Prescription drug management.           Final Clinical Impression(s) / ED Diagnoses Final diagnoses:  Dislocation of right hip, initial encounter [facility])    Rx / DC Orders ED Discharge Orders     None         Royanne Foots, DO 02/16/23 2140

## 2023-02-16 NOTE — ED Notes (Signed)
Procedure started ( reduction of hip dislocation at right hip)

## 2023-02-16 NOTE — ED Notes (Signed)
Sedation started by Estelle June, RT is also in the room and this rn

## 2023-02-16 NOTE — Discharge Instructions (Addendum)
You were seen in the emergency department for another dislocation of your right hip We were able to put the hip back in again Is important that you keep the knee immobilizer in place and try not to bend at the hip to prevent another dislocation We were able to talk to Dr. Eulah Pont (orthopedics) on your behalf Go to his office at the address listed above at 830 tomorrow morning to be evaluated and discuss further management of your recurrent hip dislocations Return to the emerged department for severe pain or repeat dislocations

## 2023-02-16 NOTE — ED Notes (Signed)
Sedation ended. Pt stable. VS wdl

## 2023-02-17 ENCOUNTER — Other Ambulatory Visit: Payer: Self-pay | Admitting: Orthopedic Surgery

## 2023-02-17 ENCOUNTER — Ambulatory Visit
Admission: RE | Admit: 2023-02-17 | Discharge: 2023-02-17 | Disposition: A | Discharge status: Home / Self Care | Payer: Medicare Other | Source: Ambulatory Visit | Attending: Orthopedic Surgery | Admitting: Orthopedic Surgery

## 2023-02-17 DIAGNOSIS — S73004A Unspecified dislocation of right hip, initial encounter: Secondary | ICD-10-CM

## 2023-02-17 DIAGNOSIS — Z96641 Presence of right artificial hip joint: Secondary | ICD-10-CM | POA: Diagnosis not present

## 2023-02-18 DIAGNOSIS — S0101XD Laceration without foreign body of scalp, subsequent encounter: Secondary | ICD-10-CM | POA: Diagnosis not present

## 2023-02-18 DIAGNOSIS — R296 Repeated falls: Secondary | ICD-10-CM | POA: Diagnosis not present

## 2023-02-18 DIAGNOSIS — B370 Candidal stomatitis: Secondary | ICD-10-CM | POA: Diagnosis not present

## 2023-02-19 ENCOUNTER — Emergency Department (HOSPITAL_COMMUNITY): Payer: Medicare Other

## 2023-02-19 ENCOUNTER — Encounter (HOSPITAL_COMMUNITY): Payer: Self-pay

## 2023-02-19 ENCOUNTER — Inpatient Hospital Stay (HOSPITAL_COMMUNITY)
Admission: EM | Admit: 2023-02-19 | Discharge: 2023-02-24 | DRG: 920 | Disposition: A | Discharge status: Skilled Nursing Facility | Payer: Medicare Other | Attending: Internal Medicine | Admitting: Internal Medicine

## 2023-02-19 ENCOUNTER — Other Ambulatory Visit: Payer: Self-pay

## 2023-02-19 DIAGNOSIS — M459 Ankylosing spondylitis of unspecified sites in spine: Secondary | ICD-10-CM | POA: Diagnosis not present

## 2023-02-19 DIAGNOSIS — Z79899 Other long term (current) drug therapy: Secondary | ICD-10-CM | POA: Diagnosis not present

## 2023-02-19 DIAGNOSIS — R0781 Pleurodynia: Secondary | ICD-10-CM | POA: Diagnosis not present

## 2023-02-19 DIAGNOSIS — Z87442 Personal history of urinary calculi: Secondary | ICD-10-CM | POA: Diagnosis not present

## 2023-02-19 DIAGNOSIS — Z888 Allergy status to other drugs, medicaments and biological substances status: Secondary | ICD-10-CM

## 2023-02-19 DIAGNOSIS — Z751 Person awaiting admission to adequate facility elsewhere: Secondary | ICD-10-CM

## 2023-02-19 DIAGNOSIS — K219 Gastro-esophageal reflux disease without esophagitis: Secondary | ICD-10-CM | POA: Diagnosis not present

## 2023-02-19 DIAGNOSIS — E782 Mixed hyperlipidemia: Secondary | ICD-10-CM | POA: Diagnosis not present

## 2023-02-19 DIAGNOSIS — R531 Weakness: Secondary | ICD-10-CM | POA: Diagnosis present

## 2023-02-19 DIAGNOSIS — S2242XA Multiple fractures of ribs, left side, initial encounter for closed fracture: Secondary | ICD-10-CM | POA: Diagnosis not present

## 2023-02-19 DIAGNOSIS — Y791 Therapeutic (nonsurgical) and rehabilitative orthopedic devices associated with adverse incidents: Secondary | ICD-10-CM | POA: Diagnosis present

## 2023-02-19 DIAGNOSIS — W06XXXA Fall from bed, initial encounter: Secondary | ICD-10-CM | POA: Diagnosis present

## 2023-02-19 DIAGNOSIS — Z8673 Personal history of transient ischemic attack (TIA), and cerebral infarction without residual deficits: Secondary | ICD-10-CM

## 2023-02-19 DIAGNOSIS — Z7902 Long term (current) use of antithrombotics/antiplatelets: Secondary | ICD-10-CM

## 2023-02-19 DIAGNOSIS — Z8249 Family history of ischemic heart disease and other diseases of the circulatory system: Secondary | ICD-10-CM | POA: Diagnosis not present

## 2023-02-19 DIAGNOSIS — S2243XA Multiple fractures of ribs, bilateral, initial encounter for closed fracture: Secondary | ICD-10-CM | POA: Diagnosis not present

## 2023-02-19 DIAGNOSIS — Z7982 Long term (current) use of aspirin: Secondary | ICD-10-CM | POA: Diagnosis not present

## 2023-02-19 DIAGNOSIS — Y92009 Unspecified place in unspecified non-institutional (private) residence as the place of occurrence of the external cause: Secondary | ICD-10-CM | POA: Diagnosis not present

## 2023-02-19 DIAGNOSIS — G8911 Acute pain due to trauma: Secondary | ICD-10-CM | POA: Diagnosis not present

## 2023-02-19 DIAGNOSIS — Z96641 Presence of right artificial hip joint: Secondary | ICD-10-CM | POA: Diagnosis not present

## 2023-02-19 DIAGNOSIS — Z800 Family history of malignant neoplasm of digestive organs: Secondary | ICD-10-CM

## 2023-02-19 DIAGNOSIS — M25551 Pain in right hip: Secondary | ICD-10-CM | POA: Diagnosis not present

## 2023-02-19 DIAGNOSIS — R262 Difficulty in walking, not elsewhere classified: Secondary | ICD-10-CM | POA: Diagnosis present

## 2023-02-19 DIAGNOSIS — S0083XA Contusion of other part of head, initial encounter: Secondary | ICD-10-CM | POA: Diagnosis not present

## 2023-02-19 DIAGNOSIS — W19XXXA Unspecified fall, initial encounter: Secondary | ICD-10-CM | POA: Diagnosis not present

## 2023-02-19 DIAGNOSIS — R6889 Other general symptoms and signs: Secondary | ICD-10-CM | POA: Diagnosis not present

## 2023-02-19 DIAGNOSIS — S0990XA Unspecified injury of head, initial encounter: Secondary | ICD-10-CM | POA: Diagnosis not present

## 2023-02-19 DIAGNOSIS — T84020A Dislocation of internal right hip prosthesis, initial encounter: Secondary | ICD-10-CM | POA: Diagnosis present

## 2023-02-19 DIAGNOSIS — Z833 Family history of diabetes mellitus: Secondary | ICD-10-CM | POA: Diagnosis not present

## 2023-02-19 DIAGNOSIS — Z803 Family history of malignant neoplasm of breast: Secondary | ICD-10-CM

## 2023-02-19 DIAGNOSIS — Y92013 Bedroom of single-family (private) house as the place of occurrence of the external cause: Secondary | ICD-10-CM

## 2023-02-19 DIAGNOSIS — S73004D Unspecified dislocation of right hip, subsequent encounter: Secondary | ICD-10-CM | POA: Diagnosis not present

## 2023-02-19 DIAGNOSIS — Z7901 Long term (current) use of anticoagulants: Secondary | ICD-10-CM

## 2023-02-19 DIAGNOSIS — Z87891 Personal history of nicotine dependence: Secondary | ICD-10-CM

## 2023-02-19 DIAGNOSIS — Z9049 Acquired absence of other specified parts of digestive tract: Secondary | ICD-10-CM | POA: Diagnosis not present

## 2023-02-19 DIAGNOSIS — I951 Orthostatic hypotension: Secondary | ICD-10-CM | POA: Diagnosis not present

## 2023-02-19 DIAGNOSIS — S00212A Abrasion of left eyelid and periocular area, initial encounter: Secondary | ICD-10-CM | POA: Diagnosis present

## 2023-02-19 DIAGNOSIS — Z471 Aftercare following joint replacement surgery: Secondary | ICD-10-CM | POA: Diagnosis not present

## 2023-02-19 DIAGNOSIS — S2232XA Fracture of one rib, left side, initial encounter for closed fracture: Secondary | ICD-10-CM | POA: Diagnosis not present

## 2023-02-19 DIAGNOSIS — F329 Major depressive disorder, single episode, unspecified: Secondary | ICD-10-CM | POA: Diagnosis present

## 2023-02-19 DIAGNOSIS — S73004A Unspecified dislocation of right hip, initial encounter: Secondary | ICD-10-CM

## 2023-02-19 LAB — CBC WITH DIFFERENTIAL/PLATELET
Abs Immature Granulocytes: 0.14 10*3/uL — ABNORMAL HIGH (ref 0.00–0.07)
Basophils Absolute: 0 10*3/uL (ref 0.0–0.1)
Basophils Relative: 0 %
Eosinophils Absolute: 0.3 10*3/uL (ref 0.0–0.5)
Eosinophils Relative: 3 %
HCT: 27.8 % — ABNORMAL LOW (ref 39.0–52.0)
Hemoglobin: 9.2 g/dL — ABNORMAL LOW (ref 13.0–17.0)
Immature Granulocytes: 1 %
Lymphocytes Relative: 6 %
Lymphs Abs: 0.6 10*3/uL — ABNORMAL LOW (ref 0.7–4.0)
MCH: 33.2 pg (ref 26.0–34.0)
MCHC: 33.1 g/dL (ref 30.0–36.0)
MCV: 100.4 fL — ABNORMAL HIGH (ref 80.0–100.0)
Monocytes Absolute: 0.6 10*3/uL (ref 0.1–1.0)
Monocytes Relative: 6 %
Neutro Abs: 8.5 10*3/uL — ABNORMAL HIGH (ref 1.7–7.7)
Neutrophils Relative %: 84 %
Platelets: 372 10*3/uL (ref 150–400)
RBC: 2.77 MIL/uL — ABNORMAL LOW (ref 4.22–5.81)
RDW: 13.5 % (ref 11.5–15.5)
WBC: 10.3 10*3/uL (ref 4.0–10.5)
nRBC: 0 % (ref 0.0–0.2)

## 2023-02-19 LAB — BASIC METABOLIC PANEL
Anion gap: 8 (ref 5–15)
BUN: 18 mg/dL (ref 8–23)
CO2: 26 mmol/L (ref 22–32)
Calcium: 8.4 mg/dL — ABNORMAL LOW (ref 8.9–10.3)
Chloride: 102 mmol/L (ref 98–111)
Creatinine, Ser: 0.72 mg/dL (ref 0.61–1.24)
GFR, Estimated: >60 mL/min (ref >60)
Glucose, Bld: 154 mg/dL — ABNORMAL HIGH (ref 70–99)
Potassium: 4.3 mmol/L (ref 3.5–5.1)
Sodium: 136 mmol/L (ref 135–145)

## 2023-02-19 MED ORDER — METHOTREXATE 2.5 MG PO TABS
20.0000 mg | ORAL_TABLET | ORAL | Status: DC
  Administered 2023-02-21: 20 mg via ORAL
  Filled 2023-02-19: qty 8

## 2023-02-19 MED ORDER — ONDANSETRON HCL 4 MG/2ML IJ SOLN: 4.0000 mg | Freq: Four times a day (QID) | INTRAMUSCULAR | Status: DC | PRN

## 2023-02-19 MED ORDER — MIRTAZAPINE 15 MG PO TABS
7.5000 mg | ORAL_TABLET | Freq: Every day | ORAL | Status: DC
  Administered 2023-02-19 – 2023-02-23 (×5): 7.5 mg via ORAL
  Filled 2023-02-19 (×5): qty 1

## 2023-02-19 MED ORDER — ROSUVASTATIN CALCIUM 10 MG PO TABS
20.0000 mg | ORAL_TABLET | Freq: Every day | ORAL | Status: DC
  Administered 2023-02-19 – 2023-02-23 (×5): 20 mg via ORAL
  Filled 2023-02-19 (×5): qty 2

## 2023-02-19 MED ORDER — ONDANSETRON HCL 4 MG PO TABS: 4.0000 mg | ORAL_TABLET | Freq: Four times a day (QID) | ORAL | Status: DC | PRN

## 2023-02-19 MED ORDER — ACETAMINOPHEN 650 MG RE SUPP: 650.0000 mg | Freq: Four times a day (QID) | RECTAL | Status: DC | PRN

## 2023-02-19 MED ORDER — HYDRALAZINE HCL 20 MG/ML IJ SOLN
5.0000 mg | Freq: Four times a day (QID) | INTRAMUSCULAR | Status: DC | PRN
  Administered 2023-02-19: 5 mg via INTRAVENOUS
  Filled 2023-02-19: qty 1

## 2023-02-19 MED ORDER — PANTOPRAZOLE SODIUM 40 MG PO TBEC
40.0000 mg | DELAYED_RELEASE_TABLET | Freq: Every day | ORAL | Status: DC
Start: 2023-02-20 — End: 2023-02-24
  Administered 2023-02-20 – 2023-02-24 (×5): 40 mg via ORAL
  Filled 2023-02-19 (×5): qty 1

## 2023-02-19 MED ORDER — POLYVINYL ALCOHOL 1.4 % OP SOLN
1.0000 [drp] | Freq: Two times a day (BID) | OPHTHALMIC | Status: DC
  Administered 2023-02-19 – 2023-02-24 (×9): 1 [drp] via OPHTHALMIC
  Filled 2023-02-19 (×2): qty 15

## 2023-02-19 MED ORDER — ENSURE ENLIVE PO LIQD
237.0000 mL | Freq: Two times a day (BID) | ORAL | Status: DC
  Administered 2023-02-20 – 2023-02-24 (×8): 237 mL via ORAL

## 2023-02-19 MED ORDER — MORPHINE SULFATE (PF) 4 MG/ML IV SOLN
4.0000 mg | Freq: Once | INTRAVENOUS | Status: AC
  Administered 2023-02-19: 4 mg via INTRAVENOUS
  Filled 2023-02-19: qty 1

## 2023-02-19 MED ORDER — ALBUTEROL SULFATE (2.5 MG/3ML) 0.083% IN NEBU
2.5000 mg | INHALATION_SOLUTION | RESPIRATORY_TRACT | Status: DC | PRN
Start: 2023-02-19 — End: 2023-02-24

## 2023-02-19 MED ORDER — SOOTHE XP OP SOLN: 1.0000 [drp] | Freq: Two times a day (BID) | OPHTHALMIC | Status: DC

## 2023-02-19 MED ORDER — CLOPIDOGREL BISULFATE 75 MG PO TABS
75.0000 mg | ORAL_TABLET | Freq: Every day | ORAL | Status: DC
  Administered 2023-02-19 – 2023-02-23 (×5): 75 mg via ORAL
  Filled 2023-02-19 (×6): qty 1

## 2023-02-19 MED ORDER — ACETAMINOPHEN 325 MG PO TABS
650.0000 mg | ORAL_TABLET | Freq: Four times a day (QID) | ORAL | Status: DC | PRN
  Administered 2023-02-21 – 2023-02-22 (×3): 650 mg via ORAL
  Filled 2023-02-19 (×3): qty 2

## 2023-02-19 MED ORDER — OXYCODONE HCL 5 MG PO TABS
5.0000 mg | ORAL_TABLET | ORAL | Status: DC | PRN
  Administered 2023-02-20 – 2023-02-23 (×7): 5 mg via ORAL
  Filled 2023-02-19 (×7): qty 1

## 2023-02-19 MED ORDER — IPRATROPIUM BROMIDE 0.06 % NA SOLN
2.0000 | Freq: Two times a day (BID) | NASAL | Status: DC | PRN
  Filled 2023-02-19: qty 15

## 2023-02-19 MED ORDER — MORPHINE SULFATE (PF) 2 MG/ML IV SOLN
2.0000 mg | INTRAVENOUS | Status: DC | PRN
  Administered 2023-02-19 – 2023-02-20 (×2): 2 mg via INTRAVENOUS
  Filled 2023-02-19 (×2): qty 1

## 2023-02-19 MED ORDER — ENOXAPARIN SODIUM 40 MG/0.4ML IJ SOSY
40.0000 mg | PREFILLED_SYRINGE | Freq: Every day | INTRAMUSCULAR | Status: DC
  Administered 2023-02-19 – 2023-02-23 (×5): 40 mg via SUBCUTANEOUS
  Filled 2023-02-19 (×5): qty 0.4

## 2023-02-19 MED ORDER — OXYCODONE HCL 5 MG PO TABS: 5.0000 mg | ORAL_TABLET | Freq: Once | ORAL | Status: DC

## 2023-02-19 NOTE — ED Provider Notes (Signed)
[name] EMERGENCY DEPARTMENT AT [facility] Provider Note   CSN: [id number] Arrival date & time: 02/19/23  1217     History  Chief Complaint  Patient presents with   Steven [name] is a 88 y.o. male with past medical history significant for depression, anemia, hyperlipidemia, right total hip arthroplasty, CVA, chronic anticoagulation with Eliquis presents to the ED via EMS from home due to a fall.  Patient has a knee immobilizer on his right leg after he dislocated his hip on 02/13/23.  Patient was seen in ED again on 02/16/23 for re-dislocation of his hip.  He reports today he was swinging his right leg out of bed to get up when he fell into a table hitting the left side of his head and chest.  He is currently complaining of rib pain on the left.  Denies shortness of breath, chest tightness, loss of consciousness, dizziness, lightheadedness, headache, nausea, vomiting.        Home Medications Prior to Admission medications   Medication Sig Start Date End Date Taking? Authorizing Provider  Artificial Tear Solution (SOOTHE XP) SOLN Place 1 drop into both eyes 2 (two) times daily.    [provider]  aspirin EC 325 MG EC tablet Take 1 tablet (325 mg total) by mouth daily. Patient not taking: Reported on 02/04/2023 04/02/21   Elease Etienne, MD  Biotin 5000 MCG TABS Take 5,000 mcg by mouth daily.    [provider]  buPROPion (WELLBUTRIN XL) 300 MG 24 hr tablet Take 300 mg by mouth daily.    [provider]  Calcium Carb-Cholecalciferol (CALCIUM+D3 PO) Take 2 tablets by mouth 2 (two) times daily with a meal.    [provider]  Calcium Citrate 250 MG TABS Take 250 mg by mouth daily.    [provider]  Cholecalciferol (VITAMIN D3) 1000 units CAPS Take 1,000 Units by mouth daily.    [provider]  clopidogrel (PLAVIX) 75 MG tablet Take 1 tablet (75 mg total) by mouth daily. Patient taking differently: Take 75  mg by mouth at bedtime. 04/02/21   Hongalgi, Maximino Greenland, MD  cyanocobalamin (VITAMIN B12) 1000 MCG tablet Take 1,000 mcg by mouth daily.    [provider]  ferrous sulfate 325 (65 FE) MG tablet Take 1 tablet (325 mg total) by mouth daily with breakfast. 06/07/21 02/04/23  Zigmund Daniel., MD  fluoruracil [facility]) 0.5 % cream Apply 1 application  topically daily as needed (for cancer treatment- as directed to affected areas).    [provider]  folic acid (FOLVITE) 1 MG tablet Take 1 mg by mouth in the morning, at noon, and at bedtime.    [provider]  GEMTESA 75 MG TABS Take 75 mg by mouth at bedtime. 06/01/22   [provider]  GOODSENSE ASPIRIN 325 MG tablet Take 325 mg by mouth daily.    [provider]  ipratropium (ATROVENT) 0.06 % nasal spray Place 2 sprays into both nostrils 2 (two) times daily as needed (allergies). 06/05/19   [provider]  methotrexate (RHEUMATREX) 2.5 MG tablet Take 20 mg by mouth every Sunday.    [provider]  mirtazapine (REMERON) 7.5 MG tablet Take 7.5 mg by mouth at bedtime. 01/25/23   [provider]  Multiple Vitamins-Minerals (PRESERVISION AREDS 2 PO) Take 1 tablet by mouth in the morning and at bedtime.    [provider]  oxyCODONE (ROXICODONE) 5  MG immediate release tablet Take 1 tablet (5 mg total) by mouth every 6 (six) hours as needed for severe pain (pain score 7-10). 02/14/23   Sloan Leiter, DO  pantoprazole (PROTONIX) 40 MG tablet Take 1 tablet (40 mg total) by mouth daily. Patient taking differently: Take 40 mg by mouth daily before breakfast. 04/02/21   Hongalgi, Maximino Greenland, MD  rosuvastatin (CRESTOR) 20 MG tablet Take 1 tablet (20 mg total) by mouth at bedtime. 04/01/21   Hongalgi, Maximino Greenland, MD  TYLENOL 8 HOUR ARTHRITIS PAIN 650 MG CR tablet Take 650 mg by mouth every 8 (eight) hours as needed for pain.    [provider]  zaleplon (SONATA) 5 MG capsule Take  5 mg by mouth at bedtime as needed for sleep. 01/11/23   [provider]      Allergies    Gluten meal, Indomethacin, and Lactose intolerance (gi)    Review of Systems   Review of Systems  Respiratory:  Negative for chest tightness and shortness of breath.   Gastrointestinal:  Negative for nausea and vomiting.  Neurological:  Negative for dizziness, syncope, light-headedness and headaches.    Physical Exam Updated Vital Signs BP 135/60 (BP Location: Left Arm)   Pulse 72   Temp 97.7 F (36.5 C) (Oral)   Resp 19   SpO2 100%  Physical Exam Vitals and nursing note reviewed.  Constitutional:      General: He is not in acute distress.    Appearance: Normal appearance. He is not ill-appearing or diaphoretic.  HENT:     Head: Normocephalic. Contusion present.      Nose: Nose normal.  Eyes:     General: Vision grossly intact.     Extraocular Movements: Extraocular movements intact.     Pupils: Pupils are equal, round, and reactive to light.  Cardiovascular:     Rate and Rhythm: Normal rate and regular rhythm.  Pulmonary:     Effort: Pulmonary effort is normal. No tachypnea, accessory muscle usage or respiratory distress.     Breath sounds: Normal breath sounds and air entry.  Chest:     Chest wall: Tenderness (lower left ribs) present. No crepitus or edema.     Comments: Abrasion to left lateral chest Musculoskeletal:     Cervical back: Full passive range of motion without pain. No spinous process tenderness or muscular tenderness.  Skin:    General: Skin is warm and dry.     Capillary Refill: Capillary refill takes less than 2 seconds.  Neurological:     Mental Status: He is alert. Mental status is at baseline.  Psychiatric:        Mood and Affect: Mood normal.        Behavior: Behavior normal.     ED Results / Procedures / Treatments   Labs (all labs ordered are listed, but only abnormal results are displayed) Labs Reviewed  CBC WITH DIFFERENTIAL/PLATELET   BASIC METABOLIC PANEL    EKG None  Radiology No results found.  Procedures Procedures    Medications Ordered in ED Medications  morphine (PF) 4 MG/ML injection 4 mg (4 mg Intravenous Given 02/19/23 1508)    ED Course/ Medical Decision Making/ A&P Clinical Course as of 02/19/23 1514  Fri Feb 19, 2023  1505 Fall at home. Knee immobilizer due to right hip dislocation. Hit chest and face on table. 2 broken ribs on xray. Waiting on imaging. Hospital admission for pain control. Plavix for CVA. Took oxycodone for his  hip dislocation.  [CG]  1507 Lives at home with his wife. Both on board with admission.  [CG]    Clinical Course User Index [CG] Al Decant, PA-C                                 Medical Decision Making Amount and/or Complexity of Data Reviewed Labs: ordered. Radiology: ordered.  Risk Prescription drug management.   This patient presents to the ED with chief complaint(s) of fall with injury with pertinent past medical history of total hip arthroplasty.  The complaint involves an extensive differential diagnosis and also carries with it a high risk of complications and morbidity.    The differential diagnosis includes acute rib fracture, re-dislocation of right hip, acute fracture, intracranial injury, cervical fracture or subluxation   The initial plan is to obtain imaging  Additional history obtained: Records reviewed  previous ED visits for right hip dislocation  Initial Assessment:   Exam significant for contusion to the left brow with ecchymosis and mild swelling.  No bony instability.  Abrasion to left lateral chest with tenderness to palpation in same area.  Lungs are clear to auscultation bilaterally.  Right leg with knee immobilizer.  No obvious deformity over the right hip.  Neck with normal ROM, no pain.  No gross deformities or other injuries appreciated on exam.    Independent visualization and interpretation of imaging: I independently  visualized the following imaging with scope of interpretation limited to determining acute life threatening conditions related to emergency care: chest x-ray, which revealed broken ribs on the left side, at least 2 from my interpretation.  Hip x-ray without acute dislocation or obvious fracture.   Treatment and Reassessment: Patient reporting pain 10/10 in both his left ribs and right hip.  Will give IV morphine for pain control.  Discussed with patient and wife that he would likely benefit from hospital admission for pain control and monitoring due to new rib fractures.  He has had 2 recent visits for hip dislocation and now had a mechanical fall secondary to knee immobilizer.  I feel patient would benefit from pain management, monitoring, and PT/OT evaluation.  Patient and his wife are both agreeable to admission.  Disposition:   3:14 PM Care transferred to Jannifer Hick, PA-C at the end of my shift as the patient will require reassessment once labs/imaging have resulted. Patient presentation, ED course, and plan of care discussed with review of all pertinent labs and imaging. Please see his/her note for further details regarding further ED course and disposition. Plan at time of handoff is follow up on imaging results and admit patient to hospital.  This may be altered or completely changed at the discretion of the oncoming team pending results of further workup.            Final Clinical Impression(s) / ED Diagnoses Final diagnoses:  Injury due to fall, initial encounter  Closed fracture of multiple ribs of left side, initial encounter  Contusion of face, initial encounter  Acute right hip pain    Rx / DC Orders ED Discharge Orders     None         Lenard Simmer, PA-C 02/19/23 1523    Anders Simmonds T, DO 02/20/23 1128

## 2023-02-19 NOTE — H&P (Signed)
History and Physical  Steven [name] AOZ:[id number] DOB: 03-21-1935 DOA: 02/19/2023  PCP: [name], MD   Chief Complaint: Fall at home  HPI: Steven [name] is a 88 y.o. male with medical history significant for ankylosing spondylitis, CVA on aspirin and Plavix, GERD, hyperlipidemia and right hip arthroplasty being admitted to the hospital for pain control and physical therapy after mechanical fall at home today.  He has had 2 episodes of spontaneous right hip dislocation this month for which he was treated in the ER and discharged home.  After the second episode on 11/5, he followed up as an outpatient with orthopedic surgery Dr. Eulah Pont on 11/6.  According to the patient, he is supposed to consult for definitive management with one of his colleagues.  In any case, the patient has been at home with a knee immobilizer and was trying to get out of bed this morning when he swung his leg out wide, and he fell off the edge of the bed hitting the left side of his head and chest on a table.  Denies losing consciousness, was not dizzy, denies any chest pain, recent fevers, or any other illness.  Review of Systems: Please see HPI for pertinent positives and negatives. A complete 10 system review of systems are otherwise negative.  Past Medical History:  Diagnosis Date   Allergy    enviromental   Anemia 2017   Anxiety    Arthritis    ankylosing spodilitis   Blood transfusion without reported diagnosis    during surgery   Cataract    Depression    Dyspnea    with exertion - had Echo done 09/29/16   History of kidney stones    Hyperlipidemia    Intestinal obstruction (HCC)    Pneumonia    as a child   Past Surgical History:  Procedure Laterality Date   ABDOMINAL SURGERY     had abcess   APPENDECTOMY     CHOLECYSTECTOMY     with lysis of adhesions   COLONOSCOPY     COLOSTOMY     COLOSTOMY REVERSAL     EYE SURGERY Left    scar tissue removed from cornea   EYE SURGERY Right    cataract  surgery with lens implant   JOINT REPLACEMENT Right    hip  x 2 1999 and 2007   SHOULDER ARTHROSCOPY WITH ROTATOR CUFF REPAIR AND SUBACROMIAL DECOMPRESSION Left 03/02/2013   Procedure: LEFT SHOULDER ARTHROSCOPY WITH SUBACROMIAL DECOMPRESSION DISTAL CALVICLE RESECTION AND ROTATOR CUFF REPAIR ;  Surgeon: Senaida Lange, MD;  Location: [facility] OR;  Service: Orthopedics;  Laterality: Left;   TOTAL HIP REVISION Right 11/06/2016   Procedure: TOTAL HIP REVISION OF THE ACETABULAR COMPONENT;  Surgeon: Gean Birchwood, MD;  Location: [facility] OR;  Service: Orthopedics;  Laterality: Right;   TRANSCAROTID ARTERY REVASCULARIZATION  Left 03/31/2021   Procedure: TRANSCAROTID ARTERY REVASCULARIZATION;  Surgeon: Victorino Sparrow, MD;  Location: [facility] OR;  Service: Vascular;  Laterality: Left;   ULTRASOUND GUIDANCE FOR VASCULAR ACCESS Right 03/31/2021   Procedure: ULTRASOUND GUIDANCE FOR VASCULAR ACCESS;  Surgeon: Victorino Sparrow, MD;  Location: [facility] OR;  Service: Vascular;  Laterality: Right;    Social History:  reports that he quit smoking about 57 years ago. His smoking use included cigarettes. He started smoking about 66 years ago. He has a 9 pack-year smoking history. He has never been exposed to tobacco smoke. He has never used smokeless tobacco. He reports current alcohol use. He reports  that he does not use drugs.   Allergies  Allergen Reactions   Gluten Meal Other (See Comments)    GLUTEN ALLERGY    Indomethacin Hives   Lactose Intolerance (Gi) Nausea And Vomiting and Other (See Comments)    GI UPSET    Family History  Problem Relation Age of Onset   Heart attack Mother    Diabetes Mother    Breast cancer Mother    Heart attack Maternal Grandfather    Pancreatic cancer Brother    Colon cancer Neg Hx    Esophageal cancer Neg Hx    Stomach cancer Neg Hx      Prior to Admission medications   Medication Sig Start Date End Date Taking? Authorizing Provider  Artificial Tear Solution (SOOTHE XP) SOLN Place 1  drop into both eyes 2 (two) times daily.    [provider]  aspirin EC 325 MG EC tablet Take 1 tablet (325 mg total) by mouth daily. Patient not taking: Reported on 02/04/2023 04/02/21   Elease Etienne, MD  Biotin 5000 MCG TABS Take 5,000 mcg by mouth daily.    [provider]  buPROPion (WELLBUTRIN XL) 300 MG 24 hr tablet Take 300 mg by mouth daily.    [provider]  Calcium Carb-Cholecalciferol (CALCIUM+D3 PO) Take 2 tablets by mouth 2 (two) times daily with a meal.    [provider]  Calcium Citrate 250 MG TABS Take 250 mg by mouth daily.    [provider]  Cholecalciferol (VITAMIN D3) 1000 units CAPS Take 1,000 Units by mouth daily.    [provider]  clopidogrel (PLAVIX) 75 MG tablet Take 1 tablet (75 mg total) by mouth daily. Patient taking differently: Take 75 mg by mouth at bedtime. 04/02/21   Hongalgi, Maximino Greenland, MD  cyanocobalamin (VITAMIN B12) 1000 MCG tablet Take 1,000 mcg by mouth daily.    [provider]  ferrous sulfate 325 (65 FE) MG tablet Take 1 tablet (325 mg total) by mouth daily with breakfast. 06/07/21 02/04/23  Zigmund Daniel., MD  fluoruracil [facility]) 0.5 % cream Apply 1 application  topically daily as needed (for cancer treatment- as directed to affected areas).    [provider]  folic acid (FOLVITE) 1 MG tablet Take 1 mg by mouth in the morning, at noon, and at bedtime.    [provider]  GEMTESA 75 MG TABS Take 75 mg by mouth at bedtime. 06/01/22   [provider]  GOODSENSE ASPIRIN 325 MG tablet Take 325 mg by mouth daily.    [provider]  ipratropium (ATROVENT) 0.06 % nasal spray Place 2 sprays into both nostrils 2 (two) times daily as needed (allergies). 06/05/19   [provider]  methotrexate (RHEUMATREX) 2.5 MG tablet Take 20 mg by mouth every Sunday.    [provider]  mirtazapine (REMERON) 7.5 MG tablet Take 7.5 mg by mouth at  bedtime. 01/25/23   [provider]  Multiple Vitamins-Minerals (PRESERVISION AREDS 2 PO) Take 1 tablet by mouth in the morning and at bedtime.    [provider]  oxyCODONE (ROXICODONE) 5 MG immediate release tablet Take 1 tablet (5 mg total) by mouth every 6 (six) hours as needed for severe pain (pain score 7-10). 02/14/23   Sloan Leiter, DO  pantoprazole (PROTONIX) 40 MG tablet Take 1 tablet (40 mg total) by mouth daily. Patient taking differently: Take 40 mg by mouth daily before breakfast. 04/02/21   Hongalgi,  Maximino Greenland, MD  rosuvastatin (CRESTOR) 20 MG tablet Take 1 tablet (20 mg total) by mouth at bedtime. 04/01/21   Hongalgi, Maximino Greenland, MD  TYLENOL 8 HOUR ARTHRITIS PAIN 650 MG CR tablet Take 650 mg by mouth every 8 (eight) hours as needed for pain.    [provider]  zaleplon (SONATA) 5 MG capsule Take 5 mg by mouth at bedtime as needed for sleep. 01/11/23   [provider]    Physical Exam: BP 115/61   Pulse 69   Temp 97.7 F (36.5 C) (Oral)   Resp 18   SpO2 91%   General:  Alert, oriented, calm, in no acute distress elderly gentleman appearing his stated age, very comfortable, pleasant and conversant Cardiovascular: RRR, no murmurs or rubs, no peripheral edema  Respiratory: clear to auscultation bilaterally, no wheezes, no crackles  Abdomen: soft, nontender, nondistended, normal bowel tones heard  Skin: dry, no rashes  Musculoskeletal: no joint effusions, normal range of motion  Psychiatric: appropriate affect, normal speech  Neurologic: extraocular muscles intact, clear speech, moving all extremities with intact sensorium         Labs on Admission:  Basic Metabolic Panel: Recent Labs  Lab 02/14/23 0002 02/16/23 1845 02/19/23 1509  NA 139 135 136  K 4.4 6.0* 4.3  CL 107 104 102  CO2 24 23 26   GLUCOSE 98 190* 154*  BUN 16 16 18   CREATININE 0.68 0.81 0.72  CALCIUM 7.7* 8.4* 8.4*   Liver Function Tests: No results for input(s):  "AST", "ALT", "ALKPHOS", "BILITOT", "PROT", "ALBUMIN" in the last 168 hours. No results for input(s): "LIPASE", "AMYLASE" in the last 168 hours. No results for input(s): "AMMONIA" in the last 168 hours. CBC: Recent Labs  Lab 02/14/23 0002 02/16/23 1845 02/19/23 1509  WBC 6.6 9.2 10.3  NEUTROABS 4.7 8.5* 8.5*  HGB 9.6* 10.5* 9.2*  HCT 29.3* 32.3* 27.8*  MCV 99.3 101.3* 100.4*  PLT 367 478* 372   Cardiac Enzymes: No results for input(s): "CKTOTAL", "CKMB", "CKMBINDEX", "TROPONINI" in the last 168 hours.  BNP (last 3 results) Recent Labs    07/11/22 1540  BNP 142.7*    ProBNP (last 3 results) No results for input(s): "PROBNP" in the last 8760 hours.  CBG: No results for input(s): "GLUCAP" in the last 168 hours.  Radiological Exams on Admission: CT Head Wo Contrast  Result Date: 02/19/2023 CLINICAL DATA:  Fall from bed, head injury EXAM: CT HEAD WITHOUT CONTRAST CT CERVICAL SPINE WITHOUT CONTRAST TECHNIQUE: Multidetector CT imaging of the head and cervical spine was performed following the standard protocol without intravenous contrast. Multiplanar CT image reconstructions of the cervical spine were also generated. RADIATION DOSE REDUCTION: This exam was performed according to the departmental dose-optimization program which includes automated exposure control, adjustment of the mA and/or kV according to patient size and/or use of iterative reconstruction technique. COMPARISON:  02/14/2023 FINDINGS: CT HEAD FINDINGS Brain: No evidence of acute infarction, hemorrhage, hydrocephalus, extra-axial collection or mass lesion/mass effect. Vascular: No hyperdense vessel or unexpected calcification. Skull: Normal. Negative for fracture or focal lesion. Sinuses/Orbits: No acute finding. Other: None. CT CERVICAL SPINE FINDINGS Alignment: Normal. Skull base and vertebrae: No acute fracture. No primary bone lesion or focal pathologic process. Soft tissues and spinal canal: No prevertebral fluid or  swelling. No visible canal hematoma. Disc levels: Complete bony ankylosis of the skull base, cervical spine, and included upper thoracic spine. Upper chest: Negative. Other: None. IMPRESSION: 1. No acute intracranial pathology. 2. No fracture  or static subluxation of the cervical spine. 3. Complete bony ankylosis of the skull base, cervical spine, and included upper thoracic spine. Findings are consistent with advanced ankylosing spondylosis. Electronically Signed   By: Jearld Lesch M.D.   On: 02/19/2023 15:38   CT Cervical Spine Wo Contrast  Result Date: 02/19/2023 CLINICAL DATA:  Fall from bed, head injury EXAM: CT HEAD WITHOUT CONTRAST CT CERVICAL SPINE WITHOUT CONTRAST TECHNIQUE: Multidetector CT imaging of the head and cervical spine was performed following the standard protocol without intravenous contrast. Multiplanar CT image reconstructions of the cervical spine were also generated. RADIATION DOSE REDUCTION: This exam was performed according to the departmental dose-optimization program which includes automated exposure control, adjustment of the mA and/or kV according to patient size and/or use of iterative reconstruction technique. COMPARISON:  02/14/2023 FINDINGS: CT HEAD FINDINGS Brain: No evidence of acute infarction, hemorrhage, hydrocephalus, extra-axial collection or mass lesion/mass effect. Vascular: No hyperdense vessel or unexpected calcification. Skull: Normal. Negative for fracture or focal lesion. Sinuses/Orbits: No acute finding. Other: None. CT CERVICAL SPINE FINDINGS Alignment: Normal. Skull base and vertebrae: No acute fracture. No primary bone lesion or focal pathologic process. Soft tissues and spinal canal: No prevertebral fluid or swelling. No visible canal hematoma. Disc levels: Complete bony ankylosis of the skull base, cervical spine, and included upper thoracic spine. Upper chest: Negative. Other: None. IMPRESSION: 1. No acute intracranial pathology. 2. No fracture or static  subluxation of the cervical spine. 3. Complete bony ankylosis of the skull base, cervical spine, and included upper thoracic spine. Findings are consistent with advanced ankylosing spondylosis. Electronically Signed   By: Jearld Lesch M.D.   On: 02/19/2023 15:38   DG Ribs Unilateral W/Chest Left  Result Date: 02/19/2023 CLINICAL DATA:  Left rib pain after fall. EXAM: LEFT RIBS AND CHEST - 3+ VIEW COMPARISON:  February 04, 2023. FINDINGS: Minimally displaced fractures involving the lateral portions of the left eighth and ninth ribs. There is no evidence of pneumothorax or pleural effusion. Both lungs are clear. Heart size and mediastinal contours are within normal limits. IMPRESSION: Minimally displaced left eighth and ninth rib fractures. Electronically Signed   By: Lupita Raider M.D.   On: 02/19/2023 15:31   DG Hip [place] W or Wo Pelvis 1 View Right  Result Date: 02/19/2023 CLINICAL DATA:  Right hip pain after fall. EXAM: DG HIP (WITH OR WITHOUT PELVIS) 1V PORT RIGHT COMPARISON:  February 17, 2023. FINDINGS: Status post right total hip arthroplasty. No fracture or dislocation is noted. IMPRESSION: No acute abnormality seen. Electronically Signed   By: Lupita Raider M.D.   On: 02/19/2023 15:28    Assessment/Plan Steven [name] is a 88 y.o. male with medical history significant for ankylosing spondylitis, CVA on aspirin and Plavix, GERD, hyperlipidemia and right hip arthroplasty being admitted to the hospital for pain control and physical therapy after mechanical fall at home today.  Mechanical fall-likely due to combination of ambulatory dysfunction, weakness, and functional difficulty given his knee immobilizer. -Observation admission -Fall precautions -PT/OT consult, may benefit from home health  Minimally displaced left lateral rib fracture-pain control  Total right hip arthroplasty with dislocations earlier this month -Outpatient orthopedic follow-up as previously  scheduled  Ankylosing spondylitis-weekly methotrexate  History of CVA-continue home aspirin and Plavix  GERD-Protonix  Hyperlipidemia-Crestor  Depression-Wellbutrin  DVT prophylaxis: Lovenox     Code Status: Full Code  Consults called: None  Admission status: Observation  Time spent: 46 minutes  Kevion Fatheree Sharlette Dense  MD [facility] Pager [phone]  If 7PM-7AM, please contact night-coverage [email or web address] Password [facility]  02/19/2023, 4:09 PM

## 2023-02-19 NOTE — ED Triage Notes (Signed)
BIBA from home for a fall. Pt has a splint to right leg, caused him to slide off bed when trying to get up.Pt hit left side ribs on night stand, also c/o right hip pain. On Plavix, no head injury, no LOC. Oxycodone was taken at 1100. 18 rac 120/62 BP 72 HR 98% room air

## 2023-02-19 NOTE — ED Notes (Signed)
ED TO INPATIENT HANDOFF REPORT  Name/Age/Gender Steven [name] 88 y.o. male  Code Status    Code Status Orders  (From admission, onward)           Start     Ordered   02/19/23 1609  Full code  Continuous       Question:  By:  Answer:  Consent: discussion documented in EHR   02/19/23 1609           Code Status History     Date Active Date Inactive Code Status Order ID Comments User Context   02/04/2023 1639 02/06/2023 1927 Full Code [id number]  Hannah Beat, MD ED   07/11/2022 1946 07/14/2022 1937 Full Code [id number]  Charlsie Quest, MD ED   06/04/2021 0011 06/06/2021 1907 DNR [id number]  Therisa Doyne, MD ED   03/27/2021 2141 04/01/2021 2044 Full Code [id number]  Chotiner, Claudean Severance, MD ED   02/29/2020 0144 03/02/2020 1754 Full Code [id number]  Rometta Emery, MD Inpatient   11/02/2019 0130 11/05/2019 1913 Full Code [id number]  Mansy, Vernetta Honey, MD ED   03/09/2019 0739 03/12/2019 2148 Full Code [id number]  Dimple Nanas, MD ED   11/06/2016 1539 11/08/2016 2018 Full Code [id number]  Allena Katz, PA-C Inpatient   03/21/2015 1052 03/27/2015 1554 Full Code [id number]  Nonie Hoyer, PA-C ED   04/10/2014 0647 04/12/2014 1747 Full Code [id number]  Eduard Clos, MD Inpatient       Home/SNF/Other Home  Chief Complaint Ambulatory dysfunction [R26.2]  Level of Care/Admitting Diagnosis ED Disposition     ED Disposition  Admit   Condition  --   Comment  Hospital Area: [facility] [[id number]]  Level of Care: Med-Surg [16]  May place patient in observation at [facility] or [facility] if equivalent level of care is available:: Yes  Covid Evaluation: Asymptomatic - no recent exposure (last 10 days) testing not required  Diagnosis: Ambulatory dysfunction [2542706]  Admitting Physician: Maryln Gottron [[id number]]  Attending Physician: Kirby Crigler, MIR [email or web address] [[id number]]          Medical History Past Medical History:  Diagnosis Date   Allergy     enviromental   Anemia 2017   Anxiety    Arthritis    ankylosing spodilitis   Blood transfusion without reported diagnosis    during surgery   Cataract    Depression    Dyspnea    with exertion - had Echo done 09/29/16   History of kidney stones    Hyperlipidemia    Intestinal obstruction (HCC)    Pneumonia    as a child    Allergies Allergies  Allergen Reactions   Gluten Meal Other (See Comments)    GLUTEN ALLERGY    Indomethacin Hives   Lactose Intolerance (Gi) Nausea And Vomiting and Other (See Comments)    GI UPSET    IV Location/Drains/Wounds Patient Lines/Drains/Airways Status     Active Line/Drains/Airways     Name Placement date Placement time Site Days   Peripheral IV 02/19/23 18 G Right Antecubital 02/19/23  1225  Antecubital  less than 1            Labs/Imaging Results for orders placed or performed during the hospital encounter of 02/19/23 (from the past 48 hour(s))  CBC with Differential     Status: Abnormal   Collection Time: 02/19/23  3:09 PM  Result Value Ref Range   WBC 10.3 4.0 - 10.5 K/uL   RBC  2.77 (L) 4.22 - 5.81 MIL/uL   Hemoglobin 9.2 (L) 13.0 - 17.0 g/dL   HCT 16.1 (L) 09.6 - 04.5 %   MCV 100.4 (H) 80.0 - 100.0 fL   MCH 33.2 26.0 - 34.0 pg   MCHC 33.1 30.0 - 36.0 g/dL   RDW 40.9 81.1 - 91.4 %   Platelets 372 150 - 400 K/uL   nRBC 0.0 0.0 - 0.2 %   Neutrophils Relative % 84 %   Neutro Abs 8.5 (H) 1.7 - 7.7 K/uL   Lymphocytes Relative 6 %   Lymphs Abs 0.6 (L) 0.7 - 4.0 K/uL   Monocytes Relative 6 %   Monocytes Absolute 0.6 0.1 - 1.0 K/uL   Eosinophils Relative 3 %   Eosinophils Absolute 0.3 0.0 - 0.5 K/uL   Basophils Relative 0 %   Basophils Absolute 0.0 0.0 - 0.1 K/uL   Immature Granulocytes 1 %   Abs Immature Granulocytes 0.14 (H) 0.00 - 0.07 K/uL    Comment: Performed at [facility], [address], [place], [place] [address]  Basic metabolic panel     Status: Abnormal   Collection Time: 02/19/23  3:09  PM  Result Value Ref Range   Sodium 136 135 - 145 mmol/L   Potassium 4.3 3.5 - 5.1 mmol/L   Chloride 102 98 - 111 mmol/L   CO2 26 22 - 32 mmol/L   Glucose, Bld 154 (H) 70 - 99 mg/dL    Comment: Glucose reference range applies only to samples taken after fasting for at least 8 hours.   BUN 18 8 - 23 mg/dL   Creatinine, Ser 6.21 0.61 - 1.24 mg/dL   Calcium 8.4 (L) 8.9 - 10.3 mg/dL   GFR, Estimated >30 >86 mL/min    Comment: (NOTE) Calculated using the CKD-EPI Creatinine Equation (2021)    Anion gap 8 5 - 15    Comment: Performed at [facility], [address] [place], [place] [address]   CT Head Wo Contrast  Result Date: 02/19/2023 CLINICAL DATA:  Fall from bed, head injury EXAM: CT HEAD WITHOUT CONTRAST CT CERVICAL SPINE WITHOUT CONTRAST TECHNIQUE: Multidetector CT imaging of the head and cervical spine was performed following the standard protocol without intravenous contrast. Multiplanar CT image reconstructions of the cervical spine were also generated. RADIATION DOSE REDUCTION: This exam was performed according to the departmental dose-optimization program which includes automated exposure control, adjustment of the mA and/or kV according to patient size and/or use of iterative reconstruction technique. COMPARISON:  02/14/2023 FINDINGS: CT HEAD FINDINGS Brain: No evidence of acute infarction, hemorrhage, hydrocephalus, extra-axial collection or mass lesion/mass effect. Vascular: No hyperdense vessel or unexpected calcification. Skull: Normal. Negative for fracture or focal lesion. Sinuses/Orbits: No acute finding. Other: None. CT CERVICAL SPINE FINDINGS Alignment: Normal. Skull base and vertebrae: No acute fracture. No primary bone lesion or focal pathologic process. Soft tissues and spinal canal: No prevertebral fluid or swelling. No visible canal hematoma. Disc levels: Complete bony ankylosis of the skull base, cervical spine, and included upper thoracic spine. Upper  chest: Negative. Other: None. IMPRESSION: 1. No acute intracranial pathology. 2. No fracture or static subluxation of the cervical spine. 3. Complete bony ankylosis of the skull base, cervical spine, and included upper thoracic spine. Findings are consistent with advanced ankylosing spondylosis. Electronically Signed   By: Jearld Lesch M.D.   On: 02/19/2023 15:38   CT Cervical Spine Wo Contrast  Result Date: 02/19/2023 CLINICAL DATA:  Fall from  bed, head injury EXAM: CT HEAD WITHOUT CONTRAST CT CERVICAL SPINE WITHOUT CONTRAST TECHNIQUE: Multidetector CT imaging of the head and cervical spine was performed following the standard protocol without intravenous contrast. Multiplanar CT image reconstructions of the cervical spine were also generated. RADIATION DOSE REDUCTION: This exam was performed according to the departmental dose-optimization program which includes automated exposure control, adjustment of the mA and/or kV according to patient size and/or use of iterative reconstruction technique. COMPARISON:  02/14/2023 FINDINGS: CT HEAD FINDINGS Brain: No evidence of acute infarction, hemorrhage, hydrocephalus, extra-axial collection or mass lesion/mass effect. Vascular: No hyperdense vessel or unexpected calcification. Skull: Normal. Negative for fracture or focal lesion. Sinuses/Orbits: No acute finding. Other: None. CT CERVICAL SPINE FINDINGS Alignment: Normal. Skull base and vertebrae: No acute fracture. No primary bone lesion or focal pathologic process. Soft tissues and spinal canal: No prevertebral fluid or swelling. No visible canal hematoma. Disc levels: Complete bony ankylosis of the skull base, cervical spine, and included upper thoracic spine. Upper chest: Negative. Other: None. IMPRESSION: 1. No acute intracranial pathology. 2. No fracture or static subluxation of the cervical spine. 3. Complete bony ankylosis of the skull base, cervical spine, and included upper thoracic spine. Findings are  consistent with advanced ankylosing spondylosis. Electronically Signed   By: Jearld Lesch M.D.   On: 02/19/2023 15:38   DG Ribs Unilateral W/Chest Left  Result Date: 02/19/2023 CLINICAL DATA:  Left rib pain after fall. EXAM: LEFT RIBS AND CHEST - 3+ VIEW COMPARISON:  February 04, 2023. FINDINGS: Minimally displaced fractures involving the lateral portions of the left eighth and ninth ribs. There is no evidence of pneumothorax or pleural effusion. Both lungs are clear. Heart size and mediastinal contours are within normal limits. IMPRESSION: Minimally displaced left eighth and ninth rib fractures. Electronically Signed   By: Lupita Raider M.D.   On: 02/19/2023 15:31   DG Hip Port [place] W or Wo Pelvis 1 View Right  Result Date: 02/19/2023 CLINICAL DATA:  Right hip pain after fall. EXAM: DG HIP (WITH OR WITHOUT PELVIS) 1V PORT RIGHT COMPARISON:  February 17, 2023. FINDINGS: Status post right total hip arthroplasty. No fracture or dislocation is noted. IMPRESSION: No acute abnormality seen. Electronically Signed   By: Lupita Raider M.D.   On: 02/19/2023 15:28    Pending Labs Unresulted Labs (From admission, onward)     Start     Ordered   02/20/23 0500  Basic metabolic panel  Tomorrow morning,   R        02/19/23 1609   02/20/23 0500  CBC  Tomorrow morning,   R        02/19/23 1609            Vitals/Pain Today's Vitals   02/19/23 1530 02/19/23 1537 02/19/23 1600 02/19/23 1615  BP: 115/61  114/60   Pulse: 69  75   Resp: 18  19   Temp:    97.6 F (36.4 C)  TempSrc:    Oral  SpO2: 91%  98%   PainSc:  2       Isolation Precautions No active isolations  Medications Medications  methotrexate (RHEUMATREX) tablet 20 mg (has no administration in time range)  rosuvastatin (CRESTOR) tablet 20 mg (has no administration in time range)  mirtazapine (REMERON) tablet 7.5 mg (has no administration in time range)  pantoprazole (PROTONIX) EC tablet 40 mg (has no administration in time  range)  clopidogrel (PLAVIX) tablet 75 mg (has no administration in time  range)  ipratropium (ATROVENT) 0.06 % nasal spray 2 spray (has no administration in time range)  Soothe XP SOLN 1 drop (has no administration in time range)  enoxaparin (LOVENOX) injection 40 mg (has no administration in time range)  acetaminophen (TYLENOL) tablet 650 mg (has no administration in time range)    Or  acetaminophen (TYLENOL) suppository 650 mg (has no administration in time range)  oxyCODONE (Oxy IR/ROXICODONE) immediate release tablet 5 mg (has no administration in time range)  morphine (PF) 2 MG/ML injection 2 mg (has no administration in time range)  ondansetron (ZOFRAN) tablet 4 mg (has no administration in time range)    Or  ondansetron (ZOFRAN) injection 4 mg (has no administration in time range)  albuterol (PROVENTIL) (2.5 MG/3ML) 0.083% nebulizer solution 2.5 mg (has no administration in time range)  hydrALAZINE (APRESOLINE) injection 5 mg (has no administration in time range)  morphine (PF) 4 MG/ML injection 4 mg (4 mg Intravenous Given 02/19/23 1508)    Mobility walks with device

## 2023-02-19 NOTE — Plan of Care (Signed)

## 2023-02-19 NOTE — ED Provider Notes (Signed)
  Physical Exam  BP 115/61   Pulse 69   Temp 97.7 F (36.5 C) (Oral)   Resp 18   SpO2 91%   Physical Exam Nursing note reviewed.  Constitutional:      General: He is not in acute distress.    Appearance: Normal appearance. He is not ill-appearing, toxic-appearing or diaphoretic.  HENT:     Head: Normocephalic.     Comments: Abrasion to left eyebrow    Mouth/Throat:     Mouth: Mucous membranes are moist.     Pharynx: Oropharynx is clear.  Eyes:     Extraocular Movements: Extraocular movements intact.     Conjunctiva/sclera: Conjunctivae normal.     Pupils: Pupils are equal, round, and reactive to light.  Cardiovascular:     Rate and Rhythm: Normal rate and regular rhythm.  Pulmonary:     Effort: Pulmonary effort is normal.     Breath sounds: Normal breath sounds.  Chest:       Comments: Tenderness to above circumscribed location.  Ecchymosis present. Abdominal:     General: Abdomen is flat. Bowel sounds are normal.     Palpations: Abdomen is soft.     Tenderness: There is no abdominal tenderness.  Neurological:     Mental Status: He is alert.     Procedures  Procedures  ED Course / MDM   Clinical Course as of 02/19/23 1607  Fri Feb 19, 2023  1505 Fall at home. Knee immobilizer due to right hip dislocation. Hit chest and face on table. 2 broken ribs on xray. Waiting on imaging. Hospital admission for pain control. Plavix for CVA. Took oxycodone for his hip dislocation.  [CG]  1507 Lives at home with his wife. Both on board with admission.  [CG]    Clinical Course User Index [CG] Al Decant, PA-C   Medical Decision Making Amount and/or Complexity of Data Reviewed Labs: ordered. Radiology: ordered.  Risk Prescription drug management.   Patient signed out to me at shift change pending imaging.  In short, 88 year old male chronically anticoagulated with past CVA also with right knee immobilizer due to persistent right hip dislocations.  Patient  reports to ED for evaluation of mechanical fall at home.  Per patient, patient had ground-level fall after trying to get out of bed secondary to his knee immobilizer.  He apparently hit his head on his nightstand as well as the left side of his rib cage.  The patient was signed out to me pending imaging.  Patient plan is to admit for pain control.  Imaging shows 8, 9th rib fractures.  CT head, CT cervical spine unremarkable.  Patient received 4mg  of morphine and his pain went from 10 out of 10 to 7 out of 10.  Will attempt to admit the patient for pain control, PT OT evaluation.  Spoke with Dr. Kirby Crigler, [facility] hospitalist, who agreed to admit patient for pain control, observation and PT OT evaluation.  Patient amenable to plan.       Al Decant, PA-C 02/19/23 1608    Linwood Dibbles, MD 02/20/23 1504

## 2023-02-20 ENCOUNTER — Other Ambulatory Visit: Payer: Medicare Other

## 2023-02-20 DIAGNOSIS — Z9049 Acquired absence of other specified parts of digestive tract: Secondary | ICD-10-CM | POA: Diagnosis not present

## 2023-02-20 DIAGNOSIS — R531 Weakness: Secondary | ICD-10-CM | POA: Diagnosis present

## 2023-02-20 DIAGNOSIS — Z751 Person awaiting admission to adequate facility elsewhere: Secondary | ICD-10-CM | POA: Diagnosis not present

## 2023-02-20 DIAGNOSIS — Y92013 Bedroom of single-family (private) house as the place of occurrence of the external cause: Secondary | ICD-10-CM | POA: Diagnosis not present

## 2023-02-20 DIAGNOSIS — T84020A Dislocation of internal right hip prosthesis, initial encounter: Secondary | ICD-10-CM | POA: Diagnosis present

## 2023-02-20 DIAGNOSIS — R278 Other lack of coordination: Secondary | ICD-10-CM | POA: Diagnosis not present

## 2023-02-20 DIAGNOSIS — Z7982 Long term (current) use of aspirin: Secondary | ICD-10-CM | POA: Diagnosis not present

## 2023-02-20 DIAGNOSIS — Y791 Therapeutic (nonsurgical) and rehabilitative orthopedic devices associated with adverse incidents: Secondary | ICD-10-CM | POA: Diagnosis present

## 2023-02-20 DIAGNOSIS — Z79899 Other long term (current) drug therapy: Secondary | ICD-10-CM | POA: Diagnosis not present

## 2023-02-20 DIAGNOSIS — Z87891 Personal history of nicotine dependence: Secondary | ICD-10-CM | POA: Diagnosis not present

## 2023-02-20 DIAGNOSIS — S00212A Abrasion of left eyelid and periocular area, initial encounter: Secondary | ICD-10-CM | POA: Diagnosis present

## 2023-02-20 DIAGNOSIS — S73004D Unspecified dislocation of right hip, subsequent encounter: Secondary | ICD-10-CM | POA: Diagnosis not present

## 2023-02-20 DIAGNOSIS — S0083XA Contusion of other part of head, initial encounter: Secondary | ICD-10-CM | POA: Diagnosis present

## 2023-02-20 DIAGNOSIS — Z7901 Long term (current) use of anticoagulants: Secondary | ICD-10-CM | POA: Diagnosis not present

## 2023-02-20 DIAGNOSIS — S2242XA Multiple fractures of ribs, left side, initial encounter for closed fracture: Secondary | ICD-10-CM | POA: Diagnosis present

## 2023-02-20 DIAGNOSIS — Z7401 Bed confinement status: Secondary | ICD-10-CM | POA: Diagnosis not present

## 2023-02-20 DIAGNOSIS — W19XXXA Unspecified fall, initial encounter: Secondary | ICD-10-CM | POA: Diagnosis not present

## 2023-02-20 DIAGNOSIS — M25551 Pain in right hip: Secondary | ICD-10-CM | POA: Diagnosis present

## 2023-02-20 DIAGNOSIS — W06XXXA Fall from bed, initial encounter: Secondary | ICD-10-CM | POA: Diagnosis present

## 2023-02-20 DIAGNOSIS — I951 Orthostatic hypotension: Secondary | ICD-10-CM | POA: Diagnosis not present

## 2023-02-20 DIAGNOSIS — M24451 Recurrent dislocation, right hip: Secondary | ICD-10-CM | POA: Diagnosis not present

## 2023-02-20 DIAGNOSIS — G8911 Acute pain due to trauma: Secondary | ICD-10-CM | POA: Diagnosis not present

## 2023-02-20 DIAGNOSIS — M459 Ankylosing spondylitis of unspecified sites in spine: Secondary | ICD-10-CM | POA: Diagnosis not present

## 2023-02-20 DIAGNOSIS — R262 Difficulty in walking, not elsewhere classified: Secondary | ICD-10-CM

## 2023-02-20 DIAGNOSIS — Z833 Family history of diabetes mellitus: Secondary | ICD-10-CM | POA: Diagnosis not present

## 2023-02-20 DIAGNOSIS — M6259 Muscle wasting and atrophy, not elsewhere classified, multiple sites: Secondary | ICD-10-CM | POA: Diagnosis not present

## 2023-02-20 DIAGNOSIS — Z87442 Personal history of urinary calculi: Secondary | ICD-10-CM | POA: Diagnosis not present

## 2023-02-20 DIAGNOSIS — M6281 Muscle weakness (generalized): Secondary | ICD-10-CM | POA: Diagnosis not present

## 2023-02-20 DIAGNOSIS — Y92009 Unspecified place in unspecified non-institutional (private) residence as the place of occurrence of the external cause: Secondary | ICD-10-CM | POA: Diagnosis not present

## 2023-02-20 DIAGNOSIS — Z7902 Long term (current) use of antithrombotics/antiplatelets: Secondary | ICD-10-CM | POA: Diagnosis not present

## 2023-02-20 DIAGNOSIS — Z8673 Personal history of transient ischemic attack (TIA), and cerebral infarction without residual deficits: Secondary | ICD-10-CM | POA: Diagnosis not present

## 2023-02-20 DIAGNOSIS — F329 Major depressive disorder, single episode, unspecified: Secondary | ICD-10-CM | POA: Diagnosis present

## 2023-02-20 DIAGNOSIS — K219 Gastro-esophageal reflux disease without esophagitis: Secondary | ICD-10-CM | POA: Diagnosis not present

## 2023-02-20 DIAGNOSIS — E782 Mixed hyperlipidemia: Secondary | ICD-10-CM | POA: Diagnosis present

## 2023-02-20 DIAGNOSIS — S2232XA Fracture of one rib, left side, initial encounter for closed fracture: Secondary | ICD-10-CM | POA: Diagnosis not present

## 2023-02-20 DIAGNOSIS — Z8249 Family history of ischemic heart disease and other diseases of the circulatory system: Secondary | ICD-10-CM | POA: Diagnosis not present

## 2023-02-20 LAB — BASIC METABOLIC PANEL
Anion gap: 7 (ref 5–15)
BUN: 15 mg/dL (ref 8–23)
CO2: 28 mmol/L (ref 22–32)
Calcium: 8.7 mg/dL — ABNORMAL LOW (ref 8.9–10.3)
Chloride: 99 mmol/L (ref 98–111)
Creatinine, Ser: 0.82 mg/dL (ref 0.61–1.24)
GFR, Estimated: >60 mL/min (ref >60)
Glucose, Bld: 123 mg/dL — ABNORMAL HIGH (ref 70–99)
Potassium: 4.6 mmol/L (ref 3.5–5.1)
Sodium: 134 mmol/L — ABNORMAL LOW (ref 135–145)

## 2023-02-20 LAB — CBC
HCT: 31.6 % — ABNORMAL LOW (ref 39.0–52.0)
Hemoglobin: 10.3 g/dL — ABNORMAL LOW (ref 13.0–17.0)
MCH: 32.5 pg (ref 26.0–34.0)
MCHC: 32.6 g/dL (ref 30.0–36.0)
MCV: 99.7 fL (ref 80.0–100.0)
Platelets: 416 10*3/uL — ABNORMAL HIGH (ref 150–400)
RBC: 3.17 MIL/uL — ABNORMAL LOW (ref 4.22–5.81)
RDW: 13.6 % (ref 11.5–15.5)
WBC: 8.7 10*3/uL (ref 4.0–10.5)
nRBC: 0 % (ref 0.0–0.2)

## 2023-02-20 MED ORDER — ORAL CARE MOUTH RINSE: 15.0000 mL | OROMUCOSAL | Status: DC | PRN

## 2023-02-20 MED ORDER — SODIUM CHLORIDE 0.9 % IV SOLN: INTRAVENOUS | Status: AC

## 2023-02-20 NOTE — Evaluation (Signed)
Physical Therapy Evaluation Patient Details Name: Steven [name] MRN: [id number] DOB: 1935/02/27 Today's Date: 02/20/2023  History of Present Illness  88 year old male with PMH of  ankylosing spondylitis, TIA, HLD, anemia, R THA revision 2018 now with right knee immobilizer due to recurrent right hip dislocations (02/13/23 and 02/16/23).  Pt admitted 2* mechanical fall at home.  Per patient, patient had ground-level fall after trying to get out of bed secondary to his knee immobilizer.  He apparently hit his head on his nightstand as well as the left side of his rib cage. Imaging showed 8th ,9th L rib fxs. Recent admission with sepsis, PNA 10/24-10/26/24.  Clinical Impression  Pt admitted with above diagnosis. Max assist for supine to sit, mod assist for sitting balance 2* significant posterior lean, +2 mod assist sit to stand. In standing pt reported significant lightheadedness, standing BP 74/47. Spouse reports pt has been getting lightheaded at home consistently in standing, orthostatic hypotension was documented on 02/04/23 during hospitalization. Pt has had multiple recent falls, family is not able to manage his care at home. Patient will benefit from continued inpatient follow up therapy, <3 hours/day.  Pt currently with functional limitations due to the deficits listed below (see PT Problem List). Pt will benefit from acute skilled PT to increase their independence and safety with mobility to allow discharge.           If plan is discharge home, recommend the following: A lot of help with bathing/dressing/bathroom;A lot of help with walking and/or transfers;Assistance with cooking/housework;Assist for transportation;Help with stairs or ramp for entrance   Can travel by private vehicle   No    Equipment Recommendations None recommended by PT  Recommendations for Other Services       Functional Status Assessment Patient has had a recent decline in their functional status and demonstrates the  ability to make significant improvements in function in a reasonable and predictable amount of time.     Precautions / Restrictions Precautions Precautions: Fall Precaution Comments: multiple falls in past 6 months, wife reports pt has ongoing lightheadedness in standing that hasn't been addressed from a medical standpoint Required Braces or Orthoses: Knee Immobilizer - Right (due to 2 recent hip dislocation) Knee Immobilizer - Right: On at all times Restrictions Weight Bearing Restrictions: No      Mobility  Bed Mobility Overal bed mobility: Needs Assistance Bed Mobility: Supine to Sit     Supine to sit: +2 for physical assistance, Max assist     General bed mobility comments: assist to raise trunk and pivot hips to EOB, posterior lean in sitting requiring mod A    Transfers Overall transfer level: Needs assistance Equipment used: Rolling walker (2 wheels) Transfers: Sit to/from Stand Sit to Stand: Mod assist, +2 safety/equipment, +2 physical assistance           General transfer comment: VCs for hand placement. Pt lightheaded in standing, BP 74/47 (see flowsheets for full orthostatic vitals)    Ambulation/Gait               General Gait Details: deferred 2* lightheadedness in standing  Stairs            Wheelchair Mobility     Tilt Bed    Modified Rankin (Stroke Patients Only)       Balance Overall balance assessment: Needs assistance, History of Falls Sitting-balance support: Feet supported Sitting balance-[name]: Poor Sitting balance - Comments: posterior lean requiring mod A for balance   Standing  balance support: During functional activity, Reliant on assistive device for balance, Bilateral upper extremity supported Standing balance-[name]: Poor Standing balance comment: lightheaded in standing, BP 74/47                             Pertinent Vitals/Pain Pain Assessment Pain Assessment: 0-10 Pain Score: 6  Pain  Location: L ribs Pain Descriptors / Indicators: Grimacing Pain Intervention(s): Limited activity within patient's tolerance, Monitored during session, Premedicated before session, Repositioned    Home Living Family/patient expects to be discharged to:: Private residence Living Arrangements: Spouse/significant other Available Help at Discharge: Family;Available 24 hours/day Type of Home: House Home Access: Stairs to enter Entrance Stairs-Rails: Can reach both Entrance Stairs-Number of Steps: 5 Alternate Level Stairs-Number of Steps: 13 Home Layout: Two level;Able to live on main level with bedroom/bathroom Home Equipment: Rolling Walker (2 wheels);Cane - single point;Shower seat;Grab bars - tub/shower      Prior Function Prior Level of Function : Independent/Modified Independent;Driving             Mobility Comments: IND with use of RW  secondary to hx of falls ADLs Comments: pt reports requiring occational assist donning socks     Extremity/Trunk Assessment                Communication   Communication Communication: Hearing impairment (B hearing aids)  Cognition Arousal: Alert Behavior During Therapy: WFL for tasks assessed/performed Overall Cognitive Status: Within Functional Limits for tasks assessed                                          General Comments      Exercises     Assessment/Plan    PT Assessment Patient needs continued PT services  PT Problem List Decreased strength;Decreased range of motion;Decreased activity tolerance;Decreased balance;Decreased mobility       PT Treatment Interventions DME instruction;Gait training;Stair training;Functional mobility training;Therapeutic activities;Therapeutic exercise;Balance training;Patient/family education    PT Goals (Current goals can be found in the Care Plan section)  Acute Rehab PT Goals Patient Stated Goal: agreeable to rehab PT Goal Formulation: With patient/family Time For  Goal Achievement: 03/06/23 Potential to Achieve Goals: Fair    Frequency Min 1X/week     Co-evaluation PT/OT/SLP Co-Evaluation/Treatment: Yes Reason for Co-Treatment: Complexity of the patient's impairments (multi-system involvement);For patient/therapist safety;To address functional/ADL transfers PT goals addressed during session: Mobility/safety with mobility;Balance;Proper use of DME         AM-PAC PT "6 Clicks" Mobility  Outcome Measure Help needed turning from your back to your side while in a flat bed without using bedrails?: A Lot Help needed moving from lying on your back to sitting on the side of a flat bed without using bedrails?: A Lot Help needed moving to and from a bed to a chair (including a wheelchair)?: A Lot Help needed standing up from a chair using your arms (e.g., wheelchair or bedside chair)?: A Lot Help needed to walk in hospital room?: A Lot Help needed climbing 3-5 steps with a railing? : Total 6 Click Score: 11    End of Session Equipment Utilized During Treatment: Gait belt Activity Tolerance: Treatment limited secondary to medical complications (Comment) (orthostatic hypotension) Patient left: with call bell/phone within reach;in bed;with bed alarm set;with family/visitor present Nurse Communication: Mobility status;Other (comment) (orthostatic) PT Visit Diagnosis: Unsteadiness on  feet (R26.81);Other abnormalities of gait and mobility (R26.89);Repeated falls (R29.6);Muscle weakness (generalized) (M62.81);Difficulty in walking, not elsewhere classified (R26.2)    Time: 1610-9604 PT Time Calculation (min) (ACUTE ONLY): 24 min   Charges:   PT Evaluation $PT Eval Moderate Complexity: 1 Mod   PT General Charges $$ ACUTE PT VISIT: 1 Visit        Tamala Ser PT 02/20/2023  Acute Rehabilitation Services  Office [phone]

## 2023-02-20 NOTE — Progress Notes (Signed)
PT Cancellation Note  Patient Details Name: Steven [name] MRN: [id number] DOB: 1934-07-16   Cancelled Treatment:    Reason Eval/Treat Not Completed: Pain limiting ability to participate (pt reported 8/10 pain at rest in the bed, will check back after pain medication is administered, RN notified of request for pain meds.)   Tamala Ser PT 02/20/2023  Acute Rehabilitation Services  Office [phone]

## 2023-02-20 NOTE — Progress Notes (Signed)
PROGRESS NOTE    Steven [name]  ZOX:[id number] DOB: [birth year] DOA: 02/19/2023 PCP: [name], MD   Brief Narrative:  HPI: Steven [name] is a 88 y.o. male with medical history significant for ankylosing spondylitis, CVA on aspirin and Plavix, GERD, hyperlipidemia and right hip arthroplasty being admitted to the hospital for pain control and physical therapy after mechanical fall at home today.  He has had 2 episodes of spontaneous right hip dislocation this month for which he was treated in the ER and discharged home.  After the second episode on 11/5, he followed up as an outpatient with orthopedic surgery Dr. Eulah Pont on 11/6.  According to the patient, he is supposed to consult for definitive management with one of his colleagues.  In any case, the patient has been at home with a knee immobilizer and was trying to get out of bed this morning when he swung his leg out wide, and he fell off the edge of the bed hitting the left side of his head and chest on a table.  Denies losing consciousness, was not dizzy, denies any chest pain, recent fevers, or any other illness.   Assessment & Plan:   Principal Problem:   Ambulatory dysfunction  History of total right hip arthroplasty with dislocations earlier this month/mechanical fall due to right hip pain, right knee immobilizer and generalized weakness :-likely due to combination of ambulatory dysfunction, weakness, and functional difficulty given his knee immobilizer.  Imaging studies negative for hip fracture.  He is scheduled to see orthopedics as outpatient.  Wife expressed concerns about him returning home and that she is unable to care for him.  Seen by PT OT, they recommend SNF, I have consulted TOC.   Minimally displaced left lateral rib fracture-pain controlled.  Incentive spirometry ordered and provided to the patient, recommended using every hour.  Orthostatic hypotension: Blood pressure dropped to 74/47 with standing.  Patient slightly  symptomatic with dizziness.  Started on normal saline at 125 cc/h for 8 hours.  Ankylosing spondylitis-weekly methotrexate   History of CVA-continue home aspirin and Plavix   GERD-Protonix   Hyperlipidemia-Crestor   Depression-Wellbutrin  DVT prophylaxis: enoxaparin (LOVENOX) injection 40 mg Start: 02/19/23 2200 SCDs Start: 02/19/23 1609   Code Status: Full Code  Family Communication:  None present at bedside.  Plan of care discussed with patient in length and he/she verbalized understanding and agreed with it.  Status is: Observation The patient will require care spanning > 2 midnights and should be moved to inpatient because: Needs discharge to SNF.   Estimated body mass index is 20.18 kg/m as calculated from the following:   Height as of this encounter: 5\' 6"  (1.676 m).   Weight as of this encounter: 56.7 kg.    Nutritional Assessment: Body mass index is 20.18 kg/m.Marland Kitchen Seen by dietician.  I agree with the assessment and plan as outlined below: Nutrition Status:        . Skin Assessment: I have examined the patient's skin and I agree with the wound assessment as performed by the wound care RN as outlined below:    Consultants:  None  Procedures:  None  Antimicrobials:  Anti-infectives (From admission, onward)    None         Subjective: Patient seen and examined.  He was doing fine.  Fully alert and oriented.  Denied any complaint.  Wanted to go home after PT.  Objective: Vitals:   02/19/23 1809 02/19/23 1931 02/20/23 0147 02/20/23 8119  BP:  (!) 174/72 (!) 103/57 (!) 110/57  Pulse:  78 72 72  Resp:   19 15  Temp:  98.5 F (36.9 C) 98.3 F (36.8 C) 98 F (36.7 C)  TempSrc:  Oral Oral Oral  SpO2:  100% 99% 99%  Weight: 56.7 kg     Height: 5\' 6"  (1.676 m)       Intake/Output Summary (Last 24 hours) at 02/20/2023 1304 Last data filed at 02/20/2023 0643 Gross per 24 hour  Intake --  Output 425 ml  Net -425 ml   Filed Weights   02/19/23 1809   Weight: 56.7 kg    Examination:  General exam: Appears calm and comfortable  Respiratory system: Clear to auscultation. Respiratory effort normal. Cardiovascular system: S1 & S2 heard, RRR. No JVD, murmurs, rubs, gallops or clicks. No pedal edema. Gastrointestinal system: Abdomen is nondistended, soft and nontender. No organomegaly or masses felt. Normal bowel sounds heard. Central nervous system: Alert and oriented. No focal neurological deficits. Extremities: Has right knee immobilizer. Skin: No rashes, lesions or ulcers Psychiatry: Judgement and insight appear normal. Mood & affect appropriate.    Data Reviewed: I have personally reviewed following labs and imaging studies  CBC: Recent Labs  Lab 02/14/23 0002 02/16/23 1845 02/19/23 1509 02/20/23 0715  WBC 6.6 9.2 10.3 8.7  NEUTROABS 4.7 8.5* 8.5*  --   HGB 9.6* 10.5* 9.2* 10.3*  HCT 29.3* 32.3* 27.8* 31.6*  MCV 99.3 101.3* 100.4* 99.7  PLT 367 478* 372 416*   Basic Metabolic Panel: Recent Labs  Lab 02/14/23 0002 02/16/23 1845 02/19/23 1509 02/20/23 0715  NA 139 135 136 134*  K 4.4 6.0* 4.3 4.6  CL 107 104 102 99  CO2 24 23 26 28   GLUCOSE 98 190* 154* 123*  BUN 16 16 18 15   CREATININE 0.68 0.81 0.72 0.82  CALCIUM 7.7* 8.4* 8.4* 8.7*   GFR: Estimated Creatinine Clearance: 51.9 mL/min (by C-G formula based on SCr of 0.82 mg/dL). Liver Function Tests: No results for input(s): "AST", "ALT", "ALKPHOS", "BILITOT", "PROT", "ALBUMIN" in the last 168 hours. No results for input(s): "LIPASE", "AMYLASE" in the last 168 hours. No results for input(s): "AMMONIA" in the last 168 hours. Coagulation Profile: No results for input(s): "INR", "PROTIME" in the last 168 hours. Cardiac Enzymes: No results for input(s): "CKTOTAL", "CKMB", "CKMBINDEX", "TROPONINI" in the last 168 hours. BNP (last 3 results) No results for input(s): "PROBNP" in the last 8760 hours. HbA1C: No results for input(s): "HGBA1C" in the last 72  hours. CBG: No results for input(s): "GLUCAP" in the last 168 hours. Lipid Profile: No results for input(s): "CHOL", "HDL", "LDLCALC", "TRIG", "CHOLHDL", "LDLDIRECT" in the last 72 hours. Thyroid Function Tests: No results for input(s): "TSH", "T4TOTAL", "FREET4", "T3FREE", "THYROIDAB" in the last 72 hours. Anemia Panel: No results for input(s): "VITAMINB12", "FOLATE", "FERRITIN", "TIBC", "IRON", "RETICCTPCT" in the last 72 hours. Sepsis Labs: No results for input(s): "PROCALCITON", "LATICACIDVEN" in the last 168 hours.  No results found for this or any previous visit (from the past 240 hour(s)).   Radiology Studies: CT Head Wo Contrast  Result Date: 02/19/2023 CLINICAL DATA:  Fall from bed, head injury EXAM: CT HEAD WITHOUT CONTRAST CT CERVICAL SPINE WITHOUT CONTRAST TECHNIQUE: Multidetector CT imaging of the head and cervical spine was performed following the standard protocol without intravenous contrast. Multiplanar CT image reconstructions of the cervical spine were also generated. RADIATION DOSE REDUCTION: This exam was performed according to the departmental dose-optimization program which includes automated  exposure control, adjustment of the mA and/or kV according to patient size and/or use of iterative reconstruction technique. COMPARISON:  02/14/2023 FINDINGS: CT HEAD FINDINGS Brain: No evidence of acute infarction, hemorrhage, hydrocephalus, extra-axial collection or mass lesion/mass effect. Vascular: No hyperdense vessel or unexpected calcification. Skull: Normal. Negative for fracture or focal lesion. Sinuses/Orbits: No acute finding. Other: None. CT CERVICAL SPINE FINDINGS Alignment: Normal. Skull base and vertebrae: No acute fracture. No primary bone lesion or focal pathologic process. Soft tissues and spinal canal: No prevertebral fluid or swelling. No visible canal hematoma. Disc levels: Complete bony ankylosis of the skull base, cervical spine, and included upper thoracic spine.  Upper chest: Negative. Other: None. IMPRESSION: 1. No acute intracranial pathology. 2. No fracture or static subluxation of the cervical spine. 3. Complete bony ankylosis of the skull base, cervical spine, and included upper thoracic spine. Findings are consistent with advanced ankylosing spondylosis. Electronically Signed   By: Jearld Lesch M.D.   On: 02/19/2023 15:38   CT Cervical Spine Wo Contrast  Result Date: 02/19/2023 CLINICAL DATA:  Fall from bed, head injury EXAM: CT HEAD WITHOUT CONTRAST CT CERVICAL SPINE WITHOUT CONTRAST TECHNIQUE: Multidetector CT imaging of the head and cervical spine was performed following the standard protocol without intravenous contrast. Multiplanar CT image reconstructions of the cervical spine were also generated. RADIATION DOSE REDUCTION: This exam was performed according to the departmental dose-optimization program which includes automated exposure control, adjustment of the mA and/or kV according to patient size and/or use of iterative reconstruction technique. COMPARISON:  02/14/2023 FINDINGS: CT HEAD FINDINGS Brain: No evidence of acute infarction, hemorrhage, hydrocephalus, extra-axial collection or mass lesion/mass effect. Vascular: No hyperdense vessel or unexpected calcification. Skull: Normal. Negative for fracture or focal lesion. Sinuses/Orbits: No acute finding. Other: None. CT CERVICAL SPINE FINDINGS Alignment: Normal. Skull base and vertebrae: No acute fracture. No primary bone lesion or focal pathologic process. Soft tissues and spinal canal: No prevertebral fluid or swelling. No visible canal hematoma. Disc levels: Complete bony ankylosis of the skull base, cervical spine, and included upper thoracic spine. Upper chest: Negative. Other: None. IMPRESSION: 1. No acute intracranial pathology. 2. No fracture or static subluxation of the cervical spine. 3. Complete bony ankylosis of the skull base, cervical spine, and included upper thoracic spine. Findings are  consistent with advanced ankylosing spondylosis. Electronically Signed   By: Jearld Lesch M.D.   On: 02/19/2023 15:38   DG Ribs Unilateral W/Chest Left  Result Date: 02/19/2023 CLINICAL DATA:  Left rib pain after fall. EXAM: LEFT RIBS AND CHEST - 3+ VIEW COMPARISON:  February 04, 2023. FINDINGS: Minimally displaced fractures involving the lateral portions of the left eighth and ninth ribs. There is no evidence of pneumothorax or pleural effusion. Both lungs are clear. Heart size and mediastinal contours are within normal limits. IMPRESSION: Minimally displaced left eighth and ninth rib fractures. Electronically Signed   By: Lupita Raider M.D.   On: 02/19/2023 15:31   DG Hip [place] W or Wo Pelvis 1 View Right  Result Date: 02/19/2023 CLINICAL DATA:  Right hip pain after fall. EXAM: DG HIP (WITH OR WITHOUT PELVIS) 1V PORT RIGHT COMPARISON:  February 17, 2023. FINDINGS: Status post right total hip arthroplasty. No fracture or dislocation is noted. IMPRESSION: No acute abnormality seen. Electronically Signed   By: Lupita Raider M.D.   On: 02/19/2023 15:28    Scheduled Meds:  clopidogrel  75 mg Oral QHS   enoxaparin (LOVENOX) injection  40 mg Subcutaneous  QHS   feeding supplement  237 mL Oral BID BM   [START ON 02/21/2023] methotrexate  20 mg Oral Q Sun   mirtazapine  7.5 mg Oral QHS   pantoprazole  40 mg Oral Daily   polyvinyl alcohol  1 drop Both Eyes BID   rosuvastatin  20 mg Oral QHS   Continuous Infusions:  sodium chloride       LOS: 0 days   Hughie Closs, MD [facility]  02/20/2023, 1:04 PM   *Please note that this is a verbal dictation therefore any spelling or grammatical errors are due to the "Dragon [employer] One" system interpretation.  Please page via Amion and do not message via secure chat for urgent patient care matters. Secure chat can be used for non urgent patient care matters.  How to contact the [facility] Attending or Consulting provider 7A - 7P or covering  provider during after hours 7P -7A, for this patient?  Check the care team in [facility] and look for a) attending/consulting [facility] provider listed and b) the [facility] team listed. Page or secure chat 7A-7P. Log into [email or web address] and use [place]'s universal password to access. If you do not have the password, please contact the hospital operator. Locate the [facility] provider you are looking for under [facility] and page to a number that you can be directly reached. If you still have difficulty reaching the provider, please page the [facility] (Director on Call) for the Hospitalists listed on amion for assistance.

## 2023-02-20 NOTE — Evaluation (Signed)
Occupational Therapy Evaluation Patient Details Name: Steven [name] MRN: [id number] DOB: 03/04/1935 Today's Date: 02/20/2023   History of Present Illness Patient is a 88 year old male who presented to the hospital after a fall at home attempting to get out of bed. Patient was admitted with L side rib fractures and head laceration. PMH: ankylosing spondylitis, TIA, HLD, anemia, R THA revision 2018, recent hip dislocations 11/2 and 11/5.   Clinical Impression   Patient is a 88 year old male who was admitted for above. Patient was living at home with wife at [facility] level . Currently, patient is unable to maintain standing with significant change in BP with transitions. Patient endorsed dizziness as well. Patient        If plan is discharge home, recommend the following: Direct supervision/assist for medications management;Assistance with cooking/housework;Direct supervision/assist for financial management;Help with stairs or ramp for entrance;Assist for transportation;A lot of help with bathing/dressing/bathroom;Two people to help with walking and/or transfers    Functional Status Assessment  Patient has had a recent decline in their functional status and demonstrates the ability to make significant improvements in function in a reasonable and predictable amount of time.  Equipment Recommendations  None recommended by OT       Precautions / Restrictions Precautions Precautions: Fall Precaution Comments: multiple falls in last 6 months, orthostatic Required Braces or Orthoses: Knee Immobilizer - Right Knee Immobilizer - Right: On at all times Restrictions Weight Bearing Restrictions: No      Mobility Bed Mobility Overal bed mobility: Needs Assistance Bed Mobility: Supine to Sit, Sit to Supine     Supine to sit: +2 for physical assistance, Max assist Sit to supine: Mod assist, +2 for physical assistance, +2 for safety/equipment   General bed mobility comments: assist to raise trunk and  pivot hips to EOB, posterior lean in sitting requiring mod A            Balance Overall balance assessment: Needs assistance, History of Falls   Sitting balance-[name]: Poor   Postural control: Posterior lean, Left lateral lean       ADL either performed or assessed with clinical judgement   ADL Overall ADL's : Needs assistance/impaired Eating/Feeding: Modified independent;Bed level   Grooming: Bed level;Set up;Supervision/safety   Upper Body Bathing: Bed level;Minimal assistance   Lower Body Bathing: Bed level;Total assistance   Upper Body Dressing : Bed level;Minimal assistance   Lower Body Dressing: Bed level;Total assistance Lower Body Dressing Details (indicate cue type and reason): KI in place. patient unable to manage this Toilet Transfer: Moderate assistance;+2 for safety/equipment;+2 for physical assistance;Rolling walker (2 wheels) Toilet Transfer Details (indicate cue type and reason): patient attempted standing with noted drop in BP. patient returned to bed at this time Toileting- Clothing Manipulation and Hygiene: Bed level;Total assistance               Vision Baseline Vision/History: 1 Wears glasses Vision Assessment?: No apparent visual deficits            Pertinent Vitals/Pain Pain Assessment Pain Assessment: 0-10 Pain Score: 6  Pain Location: L ribs Pain Descriptors / Indicators: Grimacing Pain Intervention(s): Limited activity within patient's tolerance, Repositioned, Premedicated before session, Monitored during session     Extremity/Trunk Assessment Upper Extremity Assessment Upper Extremity Assessment: Overall WFL for tasks assessed   Lower Extremity Assessment Lower Extremity Assessment: Defer to PT evaluation;RLE deficits/detail RLE Deficits / Details: two recent hip dislocations.   Cervical / Trunk Assessment Cervical / Trunk Assessment:  Other exceptions Cervical / Trunk Exceptions: ankylosing spondyliitis    Communication Communication Communication: Hearing impairment (bilateral hearing aids.)   Cognition Arousal: Alert Behavior During Therapy: WFL for tasks assessed/performed Overall Cognitive Status: Difficult to assess       General Comments: patient was plesant and cooperative but hard to formally assess cog with various family members in room answering questions for patient.     General Comments  patients BP dropped to 74/47 mmhg in standing with patient returned to bed. upon supine position, patient reported having no more dizziness. patient was educated on importance of using incentive spirometer with patietn able to reach 500 multiple times during session. nurse made aware as well.            Home Living Family/patient expects to be discharged to:: Private residence Living Arrangements: Spouse/significant other Available Help at Discharge: Family;Available 24 hours/day Type of Home: House Home Access: Stairs to enter [employer] of Steps: 5 Entrance Stairs-Rails: Can reach both Home Layout: Two level;Able to live on main level with bedroom/bathroom Alternate Level Stairs-Number of Steps: 13 Alternate Level Stairs-Rails: Right;Left;Can reach both Bathroom Shower/Tub: Producer, television/film/video: Handicapped height Bathroom Accessibility: Yes   Home Equipment: Agricultural consultant (2 wheels);Cane - single point;Shower seat;Grab bars - tub/shower          Prior Functioning/Environment Prior Level of Function : Independent/Modified Independent;Driving             Mobility Comments: IND with use of RW  secondary to hx of falls ADLs Comments: pt reports requiring occational assist donning socks        OT Problem List: Decreased activity tolerance;Impaired balance (sitting and/or standing);Decreased coordination;Decreased safety awareness;Decreased knowledge of precautions;Decreased knowledge of use of DME or AE      OT Treatment/Interventions:  Self-care/ADL training;Therapeutic exercise;DME and/or AE instruction;Therapeutic activities;Patient/family education;Balance training    OT Goals(Current goals can be found in the care plan section) Acute Rehab OT Goals Patient Stated Goal: to get better OT Goal Formulation: With patient/family Time For Goal Achievement: 03/06/23 Potential to Achieve Goals: Fair  OT Frequency: Min 1X/week    Co-evaluation PT/OT/SLP Co-Evaluation/Treatment: Yes Reason for Co-Treatment: Complexity of the patient's impairments (multi-system involvement);For patient/therapist safety;To address functional/ADL transfers PT goals addressed during session: Mobility/safety with mobility OT goals addressed during session: ADL's and self-care      AM-PAC OT "6 Clicks" Daily Activity     Outcome Measure Help from another person eating meals?: None Help from another person taking care of personal grooming?: A Little Help from another person toileting, which includes using toliet, bedpan, or urinal?: Total Help from another person bathing (including washing, rinsing, drying)?: Total Help from another person to put on and taking off regular upper body clothing?: A Lot Help from another person to put on and taking off regular lower body clothing?: A Lot 6 Click Score: 13   End of Session Equipment Utilized During Treatment: Gait belt;Rolling walker (2 wheels) Nurse Communication: Mobility status;Other (comment) (bps during session.)  Activity Tolerance: Patient tolerated treatment well Patient left: in bed;with call bell/phone within reach;with bed alarm set;with family/visitor present  OT Visit Diagnosis: Unsteadiness on feet (R26.81);Other abnormalities of gait and mobility (R26.89);Muscle weakness (generalized) (M62.81)                Time: 1610-9604 OT Time Calculation (min): 14 min Charges:  OT General Charges $OT Visit: 1 Visit OT Evaluation $OT Eval Low Complexity: 1 Low  Dajah Fischman OTR/L, MS Acute  Rehabilitation Department Office# [phone]   Selinda Flavin 02/20/2023, 4:54 PM

## 2023-02-21 DIAGNOSIS — R262 Difficulty in walking, not elsewhere classified: Secondary | ICD-10-CM | POA: Diagnosis not present

## 2023-02-21 LAB — CBC WITH DIFFERENTIAL/PLATELET
Abs Immature Granulocytes: 0.1 10*3/uL — ABNORMAL HIGH (ref 0.00–0.07)
Basophils Absolute: 0 10*3/uL (ref 0.0–0.1)
Basophils Relative: 0 %
Eosinophils Absolute: 0.3 10*3/uL (ref 0.0–0.5)
Eosinophils Relative: 3 %
HCT: 30.9 % — ABNORMAL LOW (ref 39.0–52.0)
Hemoglobin: 10 g/dL — ABNORMAL LOW (ref 13.0–17.0)
Immature Granulocytes: 1 %
Lymphocytes Relative: 10 %
Lymphs Abs: 0.9 10*3/uL (ref 0.7–4.0)
MCH: 32.5 pg (ref 26.0–34.0)
MCHC: 32.4 g/dL (ref 30.0–36.0)
MCV: 100.3 fL — ABNORMAL HIGH (ref 80.0–100.0)
Monocytes Absolute: 0.9 10*3/uL (ref 0.1–1.0)
Monocytes Relative: 10 %
Neutro Abs: 6.6 10*3/uL (ref 1.7–7.7)
Neutrophils Relative %: 76 %
Platelets: 363 10*3/uL (ref 150–400)
RBC: 3.08 MIL/uL — ABNORMAL LOW (ref 4.22–5.81)
RDW: 13.8 % (ref 11.5–15.5)
WBC: 8.8 10*3/uL (ref 4.0–10.5)
nRBC: 0 % (ref 0.0–0.2)

## 2023-02-21 LAB — BASIC METABOLIC PANEL
Anion gap: 7 (ref 5–15)
BUN: 16 mg/dL (ref 8–23)
CO2: 25 mmol/L (ref 22–32)
Calcium: 8.2 mg/dL — ABNORMAL LOW (ref 8.9–10.3)
Chloride: 100 mmol/L (ref 98–111)
Creatinine, Ser: 0.72 mg/dL (ref 0.61–1.24)
GFR, Estimated: >60 mL/min (ref >60)
Glucose, Bld: 132 mg/dL — ABNORMAL HIGH (ref 70–99)
Potassium: 3.6 mmol/L (ref 3.5–5.1)
Sodium: 132 mmol/L — ABNORMAL LOW (ref 135–145)

## 2023-02-21 MED ORDER — FOLIC ACID 1 MG PO TABS
1.0000 mg | ORAL_TABLET | Freq: Three times a day (TID) | ORAL | Status: DC
  Administered 2023-02-21 – 2023-02-22 (×3): 1 mg via ORAL
  Filled 2023-02-21 (×3): qty 1

## 2023-02-21 NOTE — Progress Notes (Signed)
PROGRESS NOTE    MILES NEUPERT  NUU:[id number] DOB: 03/22/1935 DOA: 02/19/2023 PCP: [name], MD   Brief Narrative:  HPI: Steven [name] is a 88 y.o. male with medical history significant for ankylosing spondylitis, CVA on aspirin and Plavix, GERD, hyperlipidemia and right hip arthroplasty being admitted to the hospital for pain control and physical therapy after mechanical fall at home today.  He has had 2 episodes of spontaneous right hip dislocation this month for which he was treated in the ER and discharged home.  After the second episode on 11/5, he followed up as an outpatient with orthopedic surgery Dr. Eulah Pont on 11/6.  According to the patient, he is supposed to consult for definitive management with one of his colleagues.  In any case, the patient has been at home with a knee immobilizer and was trying to get out of bed this morning when he swung his leg out wide, and he fell off the edge of the bed hitting the left side of his head and chest on a table.  Denies losing consciousness, was not dizzy, denies any chest pain, recent fevers, or any other illness.   Assessment & Plan:   Principal Problem:   Ambulatory dysfunction  History of total right hip arthroplasty with dislocations earlier this month/mechanical fall due to right hip pain, right knee immobilizer and generalized weakness :-likely due to combination of ambulatory dysfunction, weakness, and functional difficulty given his knee immobilizer.  Imaging studies negative for hip fracture.  He is scheduled to see orthopedics as outpatient.  Wife expressed concerns about him returning home and that she is unable to care for him.  Seen by PT OT, they recommend SNF, I have consulted TOC.  Medically stable waiting for placement.   Minimally displaced left lateral rib fracture-pain controlled.  Incentive spirometry ordered and provided to the patient, recommended using every hour.  Orthostatic hypotension: Blood pressure dropped to  74/47 with standing.  Patient slightly symptomatic with dizziness.  Started on normal saline at 125 cc/h for 8 hours.  Blood pressure improving.  Will check orthostatics again and if required, will start on IV fluids again.  Ankylosing spondylitis-weekly methotrexate   History of CVA-continue home aspirin and Plavix   GERD-Protonix   Hyperlipidemia-Crestor   Depression-Wellbutrin  DVT prophylaxis: enoxaparin (LOVENOX) injection 40 mg Start: 02/19/23 2200 SCDs Start: 02/19/23 1609   Code Status: Full Code  Family Communication:  None present at bedside.  Plan of care discussed with patient in length and he/she verbalized understanding and agreed with it.  Status is: Inpatient Remains inpatient appropriate because: Medically stable, pending placement to SNF.     Estimated body mass index is 20.18 kg/m as calculated from the following:   Height as of this encounter: 5\' 6"  (1.676 m).   Weight as of this encounter: 56.7 kg.    Nutritional Assessment: Body mass index is 20.18 kg/m.Marland Kitchen Seen by dietician.  I agree with the assessment and plan as outlined below: Nutrition Status:        . Skin Assessment: I have examined the patient's skin and I agree with the wound assessment as performed by the wound care RN as outlined below:    Consultants:  None  Procedures:  None  Antimicrobials:  Anti-infectives (From admission, onward)    None         Subjective: Seen and examined.  He has no complaints.  He was inquiring about the progress about SNF.  I informed him that I  have consulted TOC and social worker should be talking to him today.  Objective: Vitals:   02/20/23 1409 02/20/23 1410 02/20/23 1927 02/21/23 0445  BP: (!) 105/93 (!) 120/59 120/65 106/60  Pulse: 80 81 91 76  Resp: 20 18 16 18   Temp: 98.4 F (36.9 C) 98 F (36.7 C) 98 F (36.7 C) 97.9 F (36.6 C)  TempSrc: Oral Oral Oral   SpO2: 100%  99% 98%  Weight:      Height:        Intake/Output  Summary (Last 24 hours) at 02/21/2023 1059 Last data filed at 02/21/2023 0850 Gross per 24 hour  Intake 711.19 ml  Output 950 ml  Net -238.81 ml   Filed Weights   02/19/23 1809  Weight: 56.7 kg    Examination:  General exam: Appears calm and comfortable  Respiratory system: Clear to auscultation. Respiratory effort normal. Cardiovascular system: S1 & S2 heard, RRR. No JVD, murmurs, rubs, gallops or clicks. No pedal edema. Gastrointestinal system: Abdomen is nondistended, soft and nontender. No organomegaly or masses felt. Normal bowel sounds heard. Central nervous system: Alert and oriented. No focal neurological deficits. Extremities: Has knee immobilizer in the right lower extremity. Skin: No rashes, lesions or ulcers.  Psychiatry: Judgement and insight appear normal. Mood & affect appropriate.   Data Reviewed: I have personally reviewed following labs and imaging studies  CBC: Recent Labs  Lab 02/16/23 1845 02/19/23 1509 02/20/23 0715 02/21/23 0648  WBC 9.2 10.3 8.7 8.8  NEUTROABS 8.5* 8.5*  --  6.6  HGB 10.5* 9.2* 10.3* 10.0*  HCT 32.3* 27.8* 31.6* 30.9*  MCV 101.3* 100.4* 99.7 100.3*  PLT 478* 372 416* 363   Basic Metabolic Panel: Recent Labs  Lab 02/16/23 1845 02/19/23 1509 02/20/23 0715 02/21/23 0648  NA 135 136 134* 132*  K 6.0* 4.3 4.6 3.6  CL 104 102 99 100  CO2 23 26 28 25   GLUCOSE 190* 154* 123* 132*  BUN 16 18 15 16   CREATININE 0.81 0.72 0.82 0.72  CALCIUM 8.4* 8.4* 8.7* 8.2*   GFR: Estimated Creatinine Clearance: 53.2 mL/min (by C-G formula based on SCr of 0.72 mg/dL). Liver Function Tests: No results for input(s): "AST", "ALT", "ALKPHOS", "BILITOT", "PROT", "ALBUMIN" in the last 168 hours. No results for input(s): "LIPASE", "AMYLASE" in the last 168 hours. No results for input(s): "AMMONIA" in the last 168 hours. Coagulation Profile: No results for input(s): "INR", "PROTIME" in the last 168 hours. Cardiac Enzymes: No results for input(s):  "CKTOTAL", "CKMB", "CKMBINDEX", "TROPONINI" in the last 168 hours. BNP (last 3 results) No results for input(s): "PROBNP" in the last 8760 hours. HbA1C: No results for input(s): "HGBA1C" in the last 72 hours. CBG: No results for input(s): "GLUCAP" in the last 168 hours. Lipid Profile: No results for input(s): "CHOL", "HDL", "LDLCALC", "TRIG", "CHOLHDL", "LDLDIRECT" in the last 72 hours. Thyroid Function Tests: No results for input(s): "TSH", "T4TOTAL", "FREET4", "T3FREE", "THYROIDAB" in the last 72 hours. Anemia Panel: No results for input(s): "VITAMINB12", "FOLATE", "FERRITIN", "TIBC", "IRON", "RETICCTPCT" in the last 72 hours. Sepsis Labs: No results for input(s): "PROCALCITON", "LATICACIDVEN" in the last 168 hours.  No results found for this or any previous visit (from the past 240 hour(s)).   Radiology Studies: CT Head Wo Contrast  Result Date: 02/19/2023 CLINICAL DATA:  Fall from bed, head injury EXAM: CT HEAD WITHOUT CONTRAST CT CERVICAL SPINE WITHOUT CONTRAST TECHNIQUE: Multidetector CT imaging of the head and cervical spine was performed following the standard protocol without  intravenous contrast. Multiplanar CT image reconstructions of the cervical spine were also generated. RADIATION DOSE REDUCTION: This exam was performed according to the departmental dose-optimization program which includes automated exposure control, adjustment of the mA and/or kV according to patient size and/or use of iterative reconstruction technique. COMPARISON:  02/14/2023 FINDINGS: CT HEAD FINDINGS Brain: No evidence of acute infarction, hemorrhage, hydrocephalus, extra-axial collection or mass lesion/mass effect. Vascular: No hyperdense vessel or unexpected calcification. Skull: Normal. Negative for fracture or focal lesion. Sinuses/Orbits: No acute finding. Other: None. CT CERVICAL SPINE FINDINGS Alignment: Normal. Skull base and vertebrae: No acute fracture. No primary bone lesion or focal pathologic  process. Soft tissues and spinal canal: No prevertebral fluid or swelling. No visible canal hematoma. Disc levels: Complete bony ankylosis of the skull base, cervical spine, and included upper thoracic spine. Upper chest: Negative. Other: None. IMPRESSION: 1. No acute intracranial pathology. 2. No fracture or static subluxation of the cervical spine. 3. Complete bony ankylosis of the skull base, cervical spine, and included upper thoracic spine. Findings are consistent with advanced ankylosing spondylosis. Electronically Signed   By: Jearld Lesch M.D.   On: 02/19/2023 15:38   CT Cervical Spine Wo Contrast  Result Date: 02/19/2023 CLINICAL DATA:  Fall from bed, head injury EXAM: CT HEAD WITHOUT CONTRAST CT CERVICAL SPINE WITHOUT CONTRAST TECHNIQUE: Multidetector CT imaging of the head and cervical spine was performed following the standard protocol without intravenous contrast. Multiplanar CT image reconstructions of the cervical spine were also generated. RADIATION DOSE REDUCTION: This exam was performed according to the departmental dose-optimization program which includes automated exposure control, adjustment of the mA and/or kV according to patient size and/or use of iterative reconstruction technique. COMPARISON:  02/14/2023 FINDINGS: CT HEAD FINDINGS Brain: No evidence of acute infarction, hemorrhage, hydrocephalus, extra-axial collection or mass lesion/mass effect. Vascular: No hyperdense vessel or unexpected calcification. Skull: Normal. Negative for fracture or focal lesion. Sinuses/Orbits: No acute finding. Other: None. CT CERVICAL SPINE FINDINGS Alignment: Normal. Skull base and vertebrae: No acute fracture. No primary bone lesion or focal pathologic process. Soft tissues and spinal canal: No prevertebral fluid or swelling. No visible canal hematoma. Disc levels: Complete bony ankylosis of the skull base, cervical spine, and included upper thoracic spine. Upper chest: Negative. Other: None.  IMPRESSION: 1. No acute intracranial pathology. 2. No fracture or static subluxation of the cervical spine. 3. Complete bony ankylosis of the skull base, cervical spine, and included upper thoracic spine. Findings are consistent with advanced ankylosing spondylosis. Electronically Signed   By: Jearld Lesch M.D.   On: 02/19/2023 15:38   DG Ribs Unilateral W/Chest Left  Result Date: 02/19/2023 CLINICAL DATA:  Left rib pain after fall. EXAM: LEFT RIBS AND CHEST - 3+ VIEW COMPARISON:  February 04, 2023. FINDINGS: Minimally displaced fractures involving the lateral portions of the left eighth and ninth ribs. There is no evidence of pneumothorax or pleural effusion. Both lungs are clear. Heart size and mediastinal contours are within normal limits. IMPRESSION: Minimally displaced left eighth and ninth rib fractures. Electronically Signed   By: Lupita Raider M.D.   On: 02/19/2023 15:31   DG Hip [place] W or Wo Pelvis 1 View Right  Result Date: 02/19/2023 CLINICAL DATA:  Right hip pain after fall. EXAM: DG HIP (WITH OR WITHOUT PELVIS) 1V PORT RIGHT COMPARISON:  February 17, 2023. FINDINGS: Status post right total hip arthroplasty. No fracture or dislocation is noted. IMPRESSION: No acute abnormality seen. Electronically Signed   By: Fayrene Fearing  Christen Butter M.D.   On: 02/19/2023 15:28    Scheduled Meds:  clopidogrel  75 mg Oral QHS   enoxaparin (LOVENOX) injection  40 mg Subcutaneous QHS   feeding supplement  237 mL Oral BID BM   methotrexate  20 mg Oral Q Sun   mirtazapine  7.5 mg Oral QHS   pantoprazole  40 mg Oral Daily   polyvinyl alcohol  1 drop Both Eyes BID   rosuvastatin  20 mg Oral QHS   Continuous Infusions:     LOS: 1 day   Hughie Closs, MD [facility]  02/21/2023, 10:59 AM   *Please note that this is a verbal dictation therefore any spelling or grammatical errors are due to the "Dragon Medical One" system interpretation.  Please page via Amion and do not message via secure  chat for urgent patient care matters. Secure chat can be used for non urgent patient care matters.  How to contact the [facility] Attending or Consulting provider 7A - 7P or covering provider during after hours 7P -7A, for this patient?  Check the care team in [facility] and look for a) attending/consulting TRH provider listed and b) the [facility] team listed. Page or secure chat 7A-7P. Log into [email or web address] and use [place]'s universal password to access. If you do not have the password, please contact the hospital operator. Locate the [facility] provider you are looking for under [facility] and page to a number that you can be directly reached. If you still have difficulty reaching the provider, please page the [facility] (Director on Call) for the Hospitalists listed on amion for assistance.

## 2023-02-21 NOTE — TOC Progression Note (Signed)
Transition of Care [facility]) - Progression Note    Patient Details  Name: Steven [name] MRN: [id number] Date of Birth: [birth year]  Transition of Care [facility]) CM/SW Contact  Georgie Chard, LCSW Phone Number: 02/21/2023, 12:53 PM  Clinical Narrative:    CSW spoke to the patient's son in regards to SNF placement. At this time the family first preference [facility] and second [facility]. At this time CSW did explain SNF process to son in regards to SNF offers. CSW advised the son to look at other facilites in the case that those facilites do not offer beds. This CSW has sent out FL2 in the HUB to SNF for the family. CSW has advised son that [facility] will follow up with him once offers are available. TOC will continue to follow.        Expected Discharge Plan and Services                                               Social Determinants of Health (SDOH) Interventions SDOH Screenings   Food Insecurity: No Food Insecurity (02/19/2023)  Housing: Low Risk  (02/19/2023)  Transportation Needs: No Transportation Needs (02/19/2023)  Utilities: Not At Risk (02/19/2023)  Financial Resource Strain: Low Risk  (03/09/2019)  Physical Activity: Insufficiently Active (03/09/2019)  Social Connections: Unknown (03/09/2019)  Stress: No Stress Concern Present (03/09/2019)  Tobacco Use: Medium Risk (02/19/2023)    Readmission Risk Interventions     No data to display

## 2023-02-21 NOTE — NC FL2 (Deleted)
[place] MEDICAID FL2 LEVEL OF CARE FORM     IDENTIFICATION  Patient Name: Steven [name] Birthdate: [birth year] Sex: male Admission Date (Current Location): 02/19/2023  [facility] and [place] Number:  Producer, television/film/video and Address:  [facility],  [address] [place], [place] [address]      Provider Number: [id number]  Attending Physician Name and Address:  Hughie Closs, MD  Relative Name and Phone Number:  Clinton Sawyer [phone]    Current Level of Care: Hospital Recommended Level of Care: Skilled Nursing Facility Prior Approval Number:    Date Approved/Denied: 02/21/23 PASRR Number: [id number]  Discharge Plan: SNF    Current Diagnoses: Patient Active Problem List   Diagnosis Date Noted   Ambulatory dysfunction 02/19/2023   Sepsis due to pneumonia (HCC) 02/04/2023   Dyslipidemia 02/04/2023   GERD without esophagitis 02/04/2023   Community acquired pneumonia of right lung 02/04/2023   Gastroenteritis due to norovirus 07/11/2022   History of CVA (cerebrovascular accident) 07/11/2022   Iron deficiency anemia due to chronic blood loss 09/25/2021   Elevated CK 06/05/2021   Diarrhea 06/05/2021   Generalized weakness 06/04/2021   Dehydration 06/03/2021   Alcohol use 06/03/2021   CVA (cerebral vascular accident) (HCC) 03/28/2021   Stenosis of left carotid artery    TIA (transient ischemic attack) 03/27/2021   Small bowel obstruction due to adhesions (HCC) 02/29/2020   Hypokalemia    Hypomagnesemia    Ankylosing spondylitis of lumbosacral region [facility])    Depression    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   Dyspnea on exertion 09/11/2016   Hyperlipidemia 04/10/2014    Orientation RESPIRATION BLADDER Height & Weight     Self, Time, Situation, Place  Normal Continent Weight: 56.7 kg Height:  5\' 6"  (167.6 cm)  BEHAVIORAL SYMPTOMS/MOOD NEUROLOGICAL BOWEL NUTRITION STATUS      Continent Diet  (Regular)  AMBULATORY STATUS COMMUNICATION OF NEEDS Skin   Limited Assist Verbally Normal                       Personal Care Assistance Level of Assistance  Bathing, Feeding, Dressing, Total care Bathing Assistance: Maximum assistance (Alot) Feeding assistance: Independent Dressing Assistance: Limited assistance (Alot) Total Care Assistance: Limited assistance   Functional Limitations Info  Sight, Hearing, Speech Sight Info: Adequate Hearing Info: Adequate Speech Info: Adequate    SPECIAL CARE FACTORS FREQUENCY  PT (By licensed PT), OT (By licensed OT)     PT Frequency: X5 OT Frequency: X5            Contractures Contractures Info: Not present    Additional Factors Info  Code Status Code Status Info: FULL             Current Medications (02/21/2023):  This is the current hospital active medication list Current Facility-Administered Medications  Medication Dose Route Frequency Provider Last Rate Last Admin   acetaminophen (TYLENOL) tablet 650 mg  650 mg Oral Q6H PRN Kirby Crigler, Mir M, MD       Or   acetaminophen (TYLENOL) suppository 650 mg  650 mg Rectal Q6H PRN Kirby Crigler, Mir M, MD       albuterol (PROVENTIL) (2.5 MG/3ML) 0.083% nebulizer solution 2.5 mg  2.5 mg Nebulization Q2H PRN Kirby Crigler, Mir M, MD       clopidogrel (PLAVIX) tablet 75 mg  75 mg Oral QHS Kirby Crigler, Mir M, MD   75 mg at 02/20/23 2214   enoxaparin (  LOVENOX) injection 40 mg  40 mg Subcutaneous QHS Kirby Crigler, Mir M, MD   40 mg at 02/20/23 2213   feeding supplement (ENSURE ENLIVE / ENSURE PLUS) liquid 237 mL  237 mL Oral BID BM Kirby Crigler, Mir M, MD   237 mL at 02/21/23 0865   folic acid (FOLVITE) tablet 1 mg  1 mg Oral TID Hughie Closs, MD   1 mg at 02/21/23 1243   hydrALAZINE (APRESOLINE) injection 5 mg  5 mg Intravenous Q6H PRN Kirby Crigler, Mir M, MD   5 mg at 02/19/23 2102   ipratropium (ATROVENT) 0.06 % nasal spray 2 spray  2 spray Each Nare BID PRN Kirby Crigler, Mir M, MD        methotrexate (RHEUMATREX) tablet 20 mg  20 mg Oral Q Nikki Dom, Mir M, MD   20 mg at 02/21/23 0902   mirtazapine (REMERON) tablet 7.5 mg  7.5 mg Oral QHS Kirby Crigler, Mir M, MD   7.5 mg at 02/20/23 2214   morphine (PF) 2 MG/ML injection 2 mg  2 mg Intravenous Q2H PRN Kirby Crigler, Mir M, MD   2 mg at 02/20/23 0538   ondansetron (ZOFRAN) tablet 4 mg  4 mg Oral Q6H PRN Kirby Crigler, Mir M, MD       Or   ondansetron [facility]) injection 4 mg  4 mg Intravenous Q6H PRN Maryln Gottron, MD       Oral care mouth rinse  15 mL Mouth Rinse PRN Hughie Closs, MD       oxyCODONE (Oxy IR/ROXICODONE) immediate release tablet 5 mg  5 mg Oral Q4H PRN Kirby Crigler, Mir M, MD   5 mg at 02/21/23 0510   pantoprazole (PROTONIX) EC tablet 40 mg  40 mg Oral Daily Kirby Crigler, Mir M, MD   40 mg at 02/21/23 7846   polyvinyl alcohol (LIQUIFILM TEARS) 1.4 % ophthalmic solution 1 drop  1 drop Both Eyes BID Kirby Crigler, Mir M, MD   1 drop at 02/21/23 0905   rosuvastatin (CRESTOR) tablet 20 mg  20 mg Oral QHS Kirby Crigler, Mir M, MD   20 mg at 02/20/23 2214     Discharge Medications: Please see discharge summary for a list of discharge medications.  Relevant Imaging Results:  Relevant Lab Results:   Additional Information SSN# [id number]  Georgie Chard, LCSW

## 2023-02-21 NOTE — Plan of Care (Signed)

## 2023-02-21 NOTE — [place] FL2 (Signed)
MEDICAID FL2 LEVEL OF CARE FORM     IDENTIFICATION  Patient Name: Steven [name] Birthdate: 10-Sep-1934 Sex: male Admission Date (Current Location): 02/19/2023  [facility] and [place] Number:  Producer, television/film/video and Address:  [facility],  [address] [place], [place] [address]      Provider Number: [id number]  Attending Physician Name and Address:  Hughie Closs, MD  Relative Name and Phone Number:  Clinton Sawyer [phone]    Current Level of Care: Hospital Recommended Level of Care: Skilled Nursing Facility Prior Approval Number:    Date Approved/Denied: 02/21/23 PASRR Number: [id number]  Discharge Plan: SNF    Current Diagnoses: Patient Active Problem List   Diagnosis Date Noted   Ambulatory dysfunction 02/19/2023   Sepsis due to pneumonia (HCC) 02/04/2023   Dyslipidemia 02/04/2023   GERD without esophagitis 02/04/2023   Community acquired pneumonia of right lung 02/04/2023   Gastroenteritis due to norovirus 07/11/2022   History of CVA (cerebrovascular accident) 07/11/2022   Iron deficiency anemia due to chronic blood loss 09/25/2021   Elevated CK 06/05/2021   Diarrhea 06/05/2021   Generalized weakness 06/04/2021   Dehydration 06/03/2021   Alcohol use 06/03/2021   CVA (cerebral vascular accident) (HCC) 03/28/2021   Stenosis of left carotid artery    TIA (transient ischemic attack) 03/27/2021   Small bowel obstruction due to adhesions (HCC) 02/29/2020   Hypokalemia    Hypomagnesemia    Ankylosing spondylitis of lumbosacral region [facility])    Depression    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   Dyspnea on exertion 09/11/2016   Hyperlipidemia 04/10/2014    Orientation RESPIRATION BLADDER Height & Weight     Self, Time, Situation, Place  Normal Continent Weight: 56.7 kg Height:  5\' 6"  (167.6 cm)  BEHAVIORAL SYMPTOMS/MOOD NEUROLOGICAL BOWEL NUTRITION STATUS      Continent Diet  (Regular)  AMBULATORY STATUS COMMUNICATION OF NEEDS Skin   Limited Assist Verbally Normal                       Personal Care Assistance Level of Assistance  Bathing, Feeding, Dressing, Total care Bathing Assistance: Maximum assistance (Alot) Feeding assistance: Independent Dressing Assistance: Limited assistance (Alot) Total Care Assistance: Limited assistance   Functional Limitations Info  Sight, Hearing, Speech Sight Info: Adequate Hearing Info: Adequate Speech Info: Adequate    SPECIAL CARE FACTORS FREQUENCY  PT (By licensed PT), OT (By licensed OT)     PT Frequency: X5 OT Frequency: X5            Contractures Contractures Info: Not present    Additional Factors Info  Code Status, Allergies Code Status Info: FULL Allergies Info: Indomethacin Medium Allergy Hives   Lactose Intolerance (gi) Low Allergy           Current Medications (02/21/2023):  This is the current hospital active medication list Current Facility-Administered Medications  Medication Dose Route Frequency Provider Last Rate Last Admin   acetaminophen (TYLENOL) tablet 650 mg  650 mg Oral Q6H PRN Kirby Crigler, Mir M, MD       Or   acetaminophen (TYLENOL) suppository 650 mg  650 mg Rectal Q6H PRN Kirby Crigler, Mir M, MD       albuterol (PROVENTIL) (2.5 MG/3ML) 0.083% nebulizer solution 2.5 mg  2.5 mg Nebulization Q2H PRN Kirby Crigler, Mir M, MD       clopidogrel (PLAVIX) tablet 75 mg  75 mg Oral QHS Kirby Crigler, Mir  M, MD   75 mg at 02/20/23 2214   enoxaparin (LOVENOX) injection 40 mg  40 mg Subcutaneous QHS Kirby Crigler, Mir M, MD   40 mg at 02/20/23 2213   feeding supplement (ENSURE ENLIVE / ENSURE PLUS) liquid 237 mL  237 mL Oral BID BM Kirby Crigler, Mir M, MD   237 mL at 02/21/23 5621   folic acid (FOLVITE) tablet 1 mg  1 mg Oral TID Hughie Closs, MD   1 mg at 02/21/23 1243   hydrALAZINE (APRESOLINE) injection 5 mg  5 mg Intravenous Q6H PRN Kirby Crigler, Mir M, MD   5 mg at 02/19/23 2102   ipratropium  (ATROVENT) 0.06 % nasal spray 2 spray  2 spray Each Nare BID PRN Kirby Crigler, Mir M, MD       methotrexate (RHEUMATREX) tablet 20 mg  20 mg Oral Q Nikki Dom, Mir M, MD   20 mg at 02/21/23 0902   mirtazapine (REMERON) tablet 7.5 mg  7.5 mg Oral QHS Kirby Crigler, Mir M, MD   7.5 mg at 02/20/23 2214   morphine (PF) 2 MG/ML injection 2 mg  2 mg Intravenous Q2H PRN Kirby Crigler, Mir M, MD   2 mg at 02/20/23 0538   ondansetron (ZOFRAN) tablet 4 mg  4 mg Oral Q6H PRN Kirby Crigler, Mir M, MD       Or   ondansetron [facility]) injection 4 mg  4 mg Intravenous Q6H PRN Maryln Gottron, MD       Oral care mouth rinse  15 mL Mouth Rinse PRN Hughie Closs, MD       oxyCODONE (Oxy IR/ROXICODONE) immediate release tablet 5 mg  5 mg Oral Q4H PRN Kirby Crigler, Mir M, MD   5 mg at 02/21/23 0510   pantoprazole (PROTONIX) EC tablet 40 mg  40 mg Oral Daily Kirby Crigler, Mir M, MD   40 mg at 02/21/23 3086   polyvinyl alcohol (LIQUIFILM TEARS) 1.4 % ophthalmic solution 1 drop  1 drop Both Eyes BID Kirby Crigler, Mir M, MD   1 drop at 02/21/23 0905   rosuvastatin (CRESTOR) tablet 20 mg  20 mg Oral QHS Kirby Crigler, Mir M, MD   20 mg at 02/20/23 2214     Discharge Medications: Please see discharge summary for a list of discharge medications.  Relevant Imaging Results:  Relevant Lab Results:   Additional Information SSN# [id number]  Georgie Chard, LCSW

## 2023-02-22 DIAGNOSIS — R262 Difficulty in walking, not elsewhere classified: Secondary | ICD-10-CM | POA: Diagnosis not present

## 2023-02-22 MED ORDER — BIOTIN 5000 MCG PO TABS: 5000.0000 ug | ORAL_TABLET | Freq: Every day | ORAL | Status: DC

## 2023-02-22 MED ORDER — BUPROPION HCL ER (XL) 300 MG PO TB24
300.0000 mg | ORAL_TABLET | Freq: Every day | ORAL | Status: DC
  Administered 2023-02-22 – 2023-02-23 (×2): 300 mg via ORAL
  Filled 2023-02-22 (×2): qty 1

## 2023-02-22 MED ORDER — FOLIC ACID 1 MG PO TABS
1.0000 mg | ORAL_TABLET | Freq: Every day | ORAL | Status: DC
  Administered 2023-02-22 – 2023-02-24 (×3): 1 mg via ORAL
  Filled 2023-02-22 (×3): qty 1

## 2023-02-22 MED ORDER — PROSIGHT PO TABS
1.0000 | ORAL_TABLET | Freq: Every day | ORAL | Status: DC
  Administered 2023-02-22 – 2023-02-24 (×3): 1 via ORAL
  Filled 2023-02-22 (×3): qty 1

## 2023-02-22 MED ORDER — ASPIRIN 325 MG PO TBEC: 325.0000 mg | DELAYED_RELEASE_TABLET | Freq: Every day | ORAL | Status: DC

## 2023-02-22 MED ORDER — BUPROPION HCL ER (XL) 300 MG PO TB24: 300.0000 mg | ORAL_TABLET | Freq: Every day | ORAL | Status: DC

## 2023-02-22 MED ORDER — VITAMIN B-12 1000 MCG PO TABS
1000.0000 ug | ORAL_TABLET | Freq: Every day | ORAL | Status: DC
  Administered 2023-02-22 – 2023-02-24 (×3): 1000 ug via ORAL
  Filled 2023-02-22 (×3): qty 1

## 2023-02-22 MED ORDER — VITAMIN D3 25 MCG (1000 UNIT) PO TABS
2000.0000 [IU] | ORAL_TABLET | Freq: Every day | ORAL | Status: DC
  Administered 2023-02-22 – 2023-02-24 (×3): 2000 [IU] via ORAL
  Filled 2023-02-22 (×3): qty 2

## 2023-02-22 MED ORDER — MIRABEGRON ER 25 MG PO TB24
25.0000 mg | ORAL_TABLET | Freq: Every day | ORAL | Status: DC
  Administered 2023-02-22 – 2023-02-24 (×3): 25 mg via ORAL
  Filled 2023-02-22 (×3): qty 1

## 2023-02-22 MED ORDER — CALCIUM CITRATE 950 (200 CA) MG PO TABS
1.0000 | ORAL_TABLET | Freq: Every day | ORAL | Status: DC
  Administered 2023-02-23 – 2023-02-24 (×2): 200 mg via ORAL
  Filled 2023-02-22 (×2): qty 1

## 2023-02-22 MED ORDER — ASPIRIN 325 MG PO TBEC
325.0000 mg | DELAYED_RELEASE_TABLET | Freq: Every day | ORAL | Status: DC
  Administered 2023-02-22 – 2023-02-24 (×3): 325 mg via ORAL
  Filled 2023-02-22 (×3): qty 1

## 2023-02-22 NOTE — Progress Notes (Signed)
PROGRESS NOTE    Steven [name]  QMV:[id number] DOB: 05-11-1934 DOA: 02/19/2023 PCP: [name], MD   Brief Narrative:  HPI: Steven [name] is a 88 y.o. male with medical history significant for ankylosing spondylitis, CVA on aspirin and Plavix, GERD, hyperlipidemia and right hip arthroplasty being admitted to the hospital for pain control and physical therapy after mechanical fall at home today.  He has had 2 episodes of spontaneous right hip dislocation this month for which he was treated in the ER and discharged home.  After the second episode on 11/5, he followed up as an outpatient with orthopedic surgery Dr. Eulah Pont on 11/6.  According to the patient, he is supposed to consult for definitive management with one of his colleagues.  In any case, the patient has been at home with a knee immobilizer and was trying to get out of bed this morning when he swung his leg out wide, and he fell off the edge of the bed hitting the left side of his head and chest on a table.  Denies losing consciousness, was not dizzy, denies any chest pain, recent fevers, or any other illness.   Assessment & Plan:   Principal Problem:   Ambulatory dysfunction  History of total right hip arthroplasty with dislocations earlier this month/mechanical fall due to right hip pain, right knee immobilizer and generalized weakness :-likely due to combination of ambulatory dysfunction, weakness, and functional difficulty given his knee immobilizer.  Imaging studies negative for hip fracture.  He is scheduled to see orthopedics as outpatient.  Wife expressed concerns about him returning home and that she is unable to care for him.  Seen by PT OT, they recommend SNF, I have consulted TOC.  Medically stable waiting for placement.   Minimally displaced left lateral rib fracture-pain controlled.  Incentive spirometry ordered and provided to the patient, recommended using every hour.  Orthostatic hypotension: Blood pressure dropped to  74/47 with standing on 02/20/2023.  Patient slightly symptomatic with dizziness.  Started on normal saline at 125 cc/h for 8 hours.  Blood pressure improved and he is no more orthostatic positive now.  Encouraged water intake.  Ankylosing spondylitis-weekly methotrexate   History of CVA-continue home aspirin and Plavix   GERD-Protonix   Hyperlipidemia-Crestor   Depression-Wellbutrin  DVT prophylaxis: enoxaparin (LOVENOX) injection 40 mg Start: 02/19/23 2200 SCDs Start: 02/19/23 1609   Code Status: Full Code  Family Communication:  None present at bedside.  Plan of care discussed with patient in length and he/she verbalized understanding and agreed with it.  Status is: Inpatient Remains inpatient appropriate because: Medically stable, pending placement to SNF.     Estimated body mass index is 20.18 kg/m as calculated from the following:   Height as of this encounter: 5\' 6"  (1.676 m).   Weight as of this encounter: 56.7 kg.    Nutritional Assessment: Body mass index is 20.18 kg/m.Marland Kitchen Seen by dietician.  I agree with the assessment and plan as outlined below: Nutrition Status:        . Skin Assessment: I have examined the patient's skin and I agree with the wound assessment as performed by the wound care RN as outlined below:    Consultants:  None  Procedures:  None  Antimicrobials:  Anti-infectives (From admission, onward)    None         Subjective: Patient seen and examined.  He has no complaints.  He is looking forward to discharge as soon as we have significant meds  done for him.  Objective: Vitals:   02/21/23 1434 02/21/23 1437 02/21/23 1915 02/22/23 0449  BP: (!) 98/53 131/65 108/62 (!) 113/53  Pulse: 94 80 84 72  Resp: 18 20 16 15   Temp:  98.4 F (36.9 C) 98.9 F (37.2 C) 97.9 F (36.6 C)  TempSrc:  Oral    SpO2: 97%  99% 98%  Weight:      Height:        Intake/Output Summary (Last 24 hours) at 02/22/2023 1110 Last data filed at  02/22/2023 0900 Gross per 24 hour  Intake 834 ml  Output 1650 ml  Net -816 ml   Filed Weights   02/19/23 1809  Weight: 56.7 kg    Examination:  General exam: Appears calm and comfortable  Respiratory system: Clear to auscultation. Respiratory effort normal. Cardiovascular system: S1 & S2 heard, RRR. No JVD, murmurs, rubs, gallops or clicks. No pedal edema. Gastrointestinal system: Abdomen is nondistended, soft and nontender. No organomegaly or masses felt. Normal bowel sounds heard. Central nervous system: Alert and oriented. No focal neurological deficits. Extremities: Has knee immobilizer in the right lower extremity. Skin: No rashes, lesions or ulcers.  Psychiatry: Judgement and insight appear normal. Mood & affect appropriate.   Data Reviewed: I have personally reviewed following labs and imaging studies  CBC: Recent Labs  Lab 02/16/23 1845 02/19/23 1509 02/20/23 0715 02/21/23 0648  WBC 9.2 10.3 8.7 8.8  NEUTROABS 8.5* 8.5*  --  6.6  HGB 10.5* 9.2* 10.3* 10.0*  HCT 32.3* 27.8* 31.6* 30.9*  MCV 101.3* 100.4* 99.7 100.3*  PLT 478* 372 416* 363   Basic Metabolic Panel: Recent Labs  Lab 02/16/23 1845 02/19/23 1509 02/20/23 0715 02/21/23 0648  NA 135 136 134* 132*  K 6.0* 4.3 4.6 3.6  CL 104 102 99 100  CO2 23 26 28 25   GLUCOSE 190* 154* 123* 132*  BUN 16 18 15 16   CREATININE 0.81 0.72 0.82 0.72  CALCIUM 8.4* 8.4* 8.7* 8.2*   GFR: Estimated Creatinine Clearance: 53.2 mL/min (by C-G formula based on SCr of 0.72 mg/dL). Liver Function Tests: No results for input(s): "AST", "ALT", "ALKPHOS", "BILITOT", "PROT", "ALBUMIN" in the last 168 hours. No results for input(s): "LIPASE", "AMYLASE" in the last 168 hours. No results for input(s): "AMMONIA" in the last 168 hours. Coagulation Profile: No results for input(s): "INR", "PROTIME" in the last 168 hours. Cardiac Enzymes: No results for input(s): "CKTOTAL", "CKMB", "CKMBINDEX", "TROPONINI" in the last 168  hours. BNP (last 3 results) No results for input(s): "PROBNP" in the last 8760 hours. HbA1C: No results for input(s): "HGBA1C" in the last 72 hours. CBG: No results for input(s): "GLUCAP" in the last 168 hours. Lipid Profile: No results for input(s): "CHOL", "HDL", "LDLCALC", "TRIG", "CHOLHDL", "LDLDIRECT" in the last 72 hours. Thyroid Function Tests: No results for input(s): "TSH", "T4TOTAL", "FREET4", "T3FREE", "THYROIDAB" in the last 72 hours. Anemia Panel: No results for input(s): "VITAMINB12", "FOLATE", "FERRITIN", "TIBC", "IRON", "RETICCTPCT" in the last 72 hours. Sepsis Labs: No results for input(s): "PROCALCITON", "LATICACIDVEN" in the last 168 hours.  No results found for this or any previous visit (from the past 240 hour(s)).   Radiology Studies: No results found.  Scheduled Meds:  aspirin EC  325 mg Oral Daily   buPROPion  300 mg Oral QHS   [START ON 02/23/2023] calcium citrate  1 tablet Oral Daily   cholecalciferol  2,000 Units Oral Daily   clopidogrel  75 mg Oral QHS   cyanocobalamin  1,000  mcg Oral Daily   enoxaparin (LOVENOX) injection  40 mg Subcutaneous QHS   feeding supplement  237 mL Oral BID BM   folic acid  1 mg Oral Daily   methotrexate  20 mg Oral Q Sun   mirabegron ER  25 mg Oral Daily   mirtazapine  7.5 mg Oral QHS   multivitamin  1 tablet Oral Daily   pantoprazole  40 mg Oral Daily   polyvinyl alcohol  1 drop Both Eyes BID   rosuvastatin  20 mg Oral QHS   Continuous Infusions:     LOS: 2 days   Hughie Closs, MD [facility]  02/22/2023, 11:10 AM   *Please note that this is a verbal dictation therefore any spelling or grammatical errors are due to the "Dragon Medical One" system interpretation.  Please page via Amion and do not message via secure chat for urgent patient care matters. Secure chat can be used for non urgent patient care matters.  How to contact the [facility] Attending or Consulting provider 7A - 7P or covering provider  during after hours 7P -7A, for this patient?  Check the care team in [facility] and look for a) attending/consulting TRH provider listed and b) the [facility] team listed. Page or secure chat 7A-7P. Log into [email or web address] and use [place]'s universal password to access. If you do not have the password, please contact the hospital operator. Locate the [facility] provider you are looking for under [facility] and page to a number that you can be directly reached. If you still have difficulty reaching the provider, please page the [facility] (Director on Call) for the Hospitalists listed on amion for assistance.

## 2023-02-22 NOTE — Plan of Care (Signed)

## 2023-02-22 NOTE — Progress Notes (Signed)
Spoke to patient's wife over the phone as he missed his clinic visit with me due to his hospitalization for rib fxs. He has had issues with 2 recent hip dislocations. He had hip revision surgery with Dr. Turner Daniels back in 2018. Feel like the knee immobilizer is likely limiting his mobility, I think he would probably do better with a hip abduction brace to improve mobility but also protect hip form dislocation. Will place order for brace this morning.

## 2023-02-22 NOTE — TOC Initial Note (Addendum)
Transition of Care [facility]) - Initial/Assessment Note    Patient Details  Name: Steven [name] MRN: [id number] Date of Birth: 1934/05/09  [facility] CM/SW Contact:    Larrie Kass, LCSW Phone Number: 02/22/2023, 3:07 PM  Clinical Narrative:                 CSW met with pt, his wife, and son at bedside to present bed offers.CSW proved Medicare.Gov, star rating list. Pt and family have decided with [facility]. CSW explained the process and will start insurance. CSW spoke with [facility] to confirm bed.   Adden  3:19pm Auth pending.TOC to follow.   Expected Discharge Plan: Skilled Nursing Facility    Patient Goals and CMS Choice Patient states their goals for this hospitalization and ongoing recovery are:: SNF to get stronger CMS Medicare.gov Compare Post Acute Care list provided to:: Patient Choice offered to / list presented to : Patient, Spouse, Adult Children      Expected Discharge Plan and Services       Living arrangements for the past 2 months: Single Family Home                                      Prior Living Arrangements/Services Living arrangements for the past 2 months: Single Family Home Lives with:: Self, Spouse Patient language and need for interpreter reviewed:: Yes Do you feel safe going back to the place where you live?: Yes      Need for Family Participation in Patient Care: No (Comment) Care giver support system in place?: No (comment)   Criminal Activity/Legal Involvement Pertinent to Current Situation/Hospitalization: No - Comment as needed  Activities of Daily Living   ADL Screening (condition at time of admission) Independently performs ADLs?: Yes (appropriate for developmental age) Is the patient deaf or have difficulty hearing?: Yes Does the patient have difficulty seeing, even when wearing glasses/contacts?: No Does the patient have difficulty concentrating, remembering, or making decisions?:  No  Permission Sought/Granted                  Emotional Assessment Appearance:: Appears stated age Attitude/Demeanor/Rapport: Gracious, Engaged Affect (typically observed): Accepting Orientation: : Oriented to Place, Oriented to Self, Oriented to  Time, Oriented to Situation   Psych Involvement: No (comment)  Admission diagnosis:  Acute right hip pain [M25.551] Contusion of face, initial encounter [S00.83XA] Ambulatory dysfunction [R26.2] Closed fracture of multiple ribs of left side, initial encounter [S22.42XA] Injury due to fall, initial encounter [W19.XXXA] Patient Active Problem List   Diagnosis Date Noted   Ambulatory dysfunction 02/19/2023   Sepsis due to pneumonia (HCC) 02/04/2023   Dyslipidemia 02/04/2023   GERD without esophagitis 02/04/2023   Community acquired pneumonia of right lung 02/04/2023   Gastroenteritis due to norovirus 07/11/2022   History of CVA (cerebrovascular accident) 07/11/2022   Iron deficiency anemia due to chronic blood loss 09/25/2021   Elevated CK 06/05/2021   Diarrhea 06/05/2021   Generalized weakness 06/04/2021   Dehydration 06/03/2021   Alcohol use 06/03/2021   CVA (cerebral vascular accident) (HCC) 03/28/2021   Stenosis of left carotid artery    TIA (transient ischemic attack) 03/27/2021   Small bowel obstruction due to adhesions (HCC) 02/29/2020   Hypokalemia    Hypomagnesemia    Ankylosing spondylitis of lumbosacral region [facility])    Depression    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 [place] [address] Phone: [phone] Fax: [phone]     Social Determinants of Health (SDOH) Social History: SDOH Screenings   Food Insecurity: No Food Insecurity (02/19/2023)  Housing: Low Risk   (02/19/2023)  Transportation Needs: No Transportation Needs (02/19/2023)  Utilities: Not At Risk (02/19/2023)  Financial Resource Strain: Low Risk  (03/09/2019)  Physical Activity: Insufficiently Active (03/09/2019)  Social Connections: Unknown (03/09/2019)  Stress: No Stress Concern Present (03/09/2019)  Tobacco Use: Medium Risk (02/19/2023)   SDOH Interventions:     Readmission Risk Interventions     No data to display

## 2023-02-22 NOTE — Progress Notes (Addendum)
Physical Therapy Treatment Patient Details Name: Steven [name] MRN: [id number] DOB: Feb 22, 1935 Today's Date: 02/22/2023   History of Present Illness Patient is a 88 year old male who presented to the hospital after a fall at home attempting to get out of bed. Patient was admitted with L side rib fractures and head laceration. Pt with hx of 2 R hip dislocations this month treated in the ED and outpt ortho scheduled.  He has recently been in [place] and reports difficulty with mobility due to [facility].  Ortho consulted and placed order for hip abduction brace - brace has not arrived, PT notified ortho tech 02/22/23. PMH: ankylosing spondylitis, TIA, HLD, anemia, R THA revision 2018, recent hip dislocations 11/2 and 11/5.    PT Comments  Pt with improved tolerance for activity today. His Bps were soft but stable (see below).  Pt was able to ambulate 40' with RW and min A .  He needed education and cues for posterior precautions.  Called ortho tech in regards to hip abd brace order.   Continue POC with recommendation Patient will benefit from continued inpatient follow up therapy, <3 hours/day at d/c.   BP Supine 92/59 (69) BP sitting 106/55 (55) BP standing 91/53 (62) BP standing post walk 109/53 (71)   If plan is discharge home, recommend the following: A little help with walking and/or transfers;A little help with bathing/dressing/bathroom;Assistance with cooking/housework   Can travel by private vehicle     Yes  Equipment Recommendations  None recommended by PT    Recommendations for Other Services       Precautions / Restrictions Precautions Precautions: Fall;Posterior Hip Precaution Booklet Issued: Yes (comment) Precaution Comments: Provided posterior precautions, no orders in chart but hx of posterior revision with dislocations. Required Braces or Orthoses: Other Brace Other Brace: Currently with R knee immobilizer to maintain hip precautions - order for hip abd brace that has not arrived  yet Restrictions Weight Bearing Restrictions: No     Mobility  Bed Mobility Overal bed mobility: Needs Assistance Bed Mobility: Supine to Sit     Supine to sit: Min assist     General bed mobility comments: Cues for posterior hip precautions when scooting forward    Transfers Overall transfer level: Needs assistance Equipment used: Rolling walker (2 wheels) Transfers: Sit to/from Stand Sit to Stand: Min assist           General transfer comment: Cues for hand placement and not leaning too far forward for posterior hip precautions    Ambulation/Gait Ambulation/Gait assistance: Min assist Gait Distance (Feet): 40 Feet Assistive device: Rolling walker (2 wheels) Gait Pattern/deviations: Step-to pattern, Decreased stride length Gait velocity: decreased     General Gait Details: Min A for balance   Stairs             Wheelchair Mobility     Tilt Bed    Modified Rankin (Stroke Patients Only)       Balance Overall balance assessment: Needs assistance, History of Falls Sitting-balance support: Feet supported Sitting balance-[name]: Good     Standing balance support: Reliant on assistive device for balance, Bilateral upper extremity supported Standing balance-[name]: Poor Standing balance comment: RW and CGA                            Cognition Arousal: Alert Behavior During Therapy: WFL for tasks assessed/performed Overall Cognitive Status: Within Functional Limits for tasks assessed  Exercises Total Joint Exercises Knee Flexion: AROM, Right, 10 reps, Seated General Exercises - Lower Extremity Ankle Circles/Pumps: AROM, Both, 10 reps, Seated Long Arc Quad: AROM, Both, 10 reps, Seated Hip Flexion/Marching: AROM, Left, 10 reps, Seated Other Exercises Other Exercises: Bil UE shoulder press x 10 AROM    General Comments        Pertinent Vitals/Pain Pain  Assessment Pain Assessment: Faces Faces Pain Scale: Hurts little more Pain Location: L ribs Pain Descriptors / Indicators: Grimacing Pain Intervention(s): Limited activity within patient's tolerance, Monitored during session, Repositioned (gait belt high)    Home Living                          Prior Function            PT Goals (current goals can now be found in the care plan section) Progress towards PT goals: Progressing toward goals    Frequency    Min 1X/week      PT Plan      Co-evaluation              AM-PAC PT "6 Clicks" Mobility   Outcome Measure  Help needed turning from your back to your side while in a flat bed without using bedrails?: A Little Help needed moving from lying on your back to sitting on the side of a flat bed without using bedrails?: A Little Help needed moving to and from a bed to a chair (including a wheelchair)?: A Little Help needed standing up from a chair using your arms (e.g., wheelchair or bedside chair)?: A Little Help needed to walk in hospital room?: A Little Help needed climbing 3-5 steps with a railing? : A Lot 6 Click Score: 17    End of Session Equipment Utilized During Treatment: Gait belt (Did not utilize KI during session as PT assisting with maintaining posterior precautions; placed at end of session) Activity Tolerance: Patient tolerated treatment well Patient left: with call bell/phone within reach;with family/visitor present;in chair Nurse Communication: Mobility status PT Visit Diagnosis: Unsteadiness on feet (R26.81);Other abnormalities of gait and mobility (R26.89);Repeated falls (R29.6);Muscle weakness (generalized) (M62.81);Difficulty in walking, not elsewhere classified (R26.2)     Time: 1610-9604 PT Time Calculation (min) (ACUTE ONLY): 26 min  Charges:    $Gait Training: 8-22 mins $Therapeutic Activity: 8-22 mins PT General Charges $$ ACUTE PT VISIT: 1 Visit                     Anise Salvo,  PT Acute Rehab Services [facility] [phone]    Rayetta Humphrey 02/22/2023, 4:06 PM

## 2023-02-22 NOTE — Progress Notes (Signed)
Orthopedic Tech Progress Note Patient Details:  NELDON KARSTEN 02-11-1935 [id number]  Patient ID: Lorri Frederick, male   DOB: Aug 20, 1934, 88 y.o.   MRN: [id number] Hip brace ordered from [facility], will be delivered tomorrow from [facility]. Darleen Crocker 02/22/2023, 4:13 PM

## 2023-02-23 DIAGNOSIS — R262 Difficulty in walking, not elsewhere classified: Secondary | ICD-10-CM | POA: Diagnosis not present

## 2023-02-23 LAB — BASIC METABOLIC PANEL
Anion gap: 8 (ref 5–15)
BUN: 14 mg/dL (ref 8–23)
CO2: 26 mmol/L (ref 22–32)
Calcium: 8.4 mg/dL — ABNORMAL LOW (ref 8.9–10.3)
Chloride: 98 mmol/L (ref 98–111)
Creatinine, Ser: 0.78 mg/dL (ref 0.61–1.24)
GFR, Estimated: >60 mL/min (ref >60)
Glucose, Bld: 129 mg/dL — ABNORMAL HIGH (ref 70–99)
Potassium: 4.1 mmol/L (ref 3.5–5.1)
Sodium: 132 mmol/L — ABNORMAL LOW (ref 135–145)

## 2023-02-23 MED ORDER — NYSTATIN 100000 UNIT/ML MT SUSP
5.0000 mL | Freq: Four times a day (QID) | OROMUCOSAL | Status: DC
  Administered 2023-02-23 – 2023-02-24 (×4): 500000 [IU] via ORAL
  Filled 2023-02-23 (×4): qty 5

## 2023-02-23 MED ORDER — OXYCODONE HCL 5 MG PO TABS: 5.0000 mg | ORAL_TABLET | Freq: Four times a day (QID) | ORAL | 0 refills | Status: DC | PRN

## 2023-02-23 NOTE — Discharge Summary (Signed)
Physician Discharge Summary  Steven [name]:[id number] DOB: 09-27-1934 DOA: 02/19/2023  PCP: [name], MD  Admit date: 02/19/2023 Discharge date: 02/23/2023 30 Day Unplanned Readmission Risk Score    Flowsheet Row ED to Hosp-Admission (Current) from 02/19/2023 in [facility]  30 Day Unplanned Readmission Risk Score (%) 24.97 Filed at 02/23/2023 0801       This score is the patient's risk of an unplanned readmission within 30 days of being discharged (0 -100%). The score is based on dignosis, age, lab data, medications, orders, and past utilization.   Low:  0-14.9   Medium: 15-21.9   High: 22-29.9   Extreme: 30 and above          Admitted From: Home Disposition: SNF  Recommendations for Outpatient Follow-up:  Follow up with PCP in 1-2 weeks Please obtain BMP/CBC in one week Please follow up with your PCP on the following pending results: Unresulted Labs (From admission, onward)    None         Home Health: None Equipment/Devices: None  Discharge Condition: Stable CODE STATUS: Full code Diet recommendation: Cardiac  Following HPI is copied from admitting hospitalist H&P. HPI: Steven [name] is a 88 y.o. male with medical history significant for ankylosing spondylitis, CVA on aspirin and Plavix, GERD, hyperlipidemia and right hip arthroplasty being admitted to the hospital for pain control and physical therapy after mechanical fall at home today.  He has had 2 episodes of spontaneous right hip dislocation this month for which he was treated in the ER and discharged home.  After the second episode on 11/5, he followed up as an outpatient with orthopedic surgery Dr. Eulah Pont on 11/6.  According to the patient, he is supposed to consult for definitive management with one of his colleagues.  In any case, the patient has been at home with a knee immobilizer and was trying to get out of bed this morning when he swung his leg out wide, and he  fell off the edge of the bed hitting the left side of his head and chest on a table.  Denies losing consciousness, was not dizzy, denies any chest pain, recent fevers, or any other illness.   Subjective: Seen and examined.  No complaints.  He is looking forward to going to SNF today.  Brief/Interim Summary: Patient was admitted for the following.  History of total right hip arthroplasty with dislocations earlier this month/mechanical fall due to right hip pain, right knee immobilizer and generalized weakness :-likely due to combination of ambulatory dysfunction, weakness, and functional difficulty given his knee immobilizer.  Imaging studies negative for hip fracture.  He is scheduled to see orthopedics as outpatient.  Wife expressed concerns about him returning home and that she is unable to care for him.  Seen by PT OT, they recommend SNF, I have consulted TOC.  Medically stable waiting for placement.  We are waiting for insurance authorization and hoping to receive it today.  Once received, patient will be discharged today.   Minimally displaced left lateral rib fracture-pain controlled.  Incentive spirometry ordered and provided to the patient, recommended using every hour.   Orthostatic hypotension: Blood pressure dropped to 74/47 with standing on 02/20/2023.  Patient slightly symptomatic with dizziness.  Started on normal saline at 125 cc/h for 8 hours.  Blood pressure improved and he is no more orthostatic positive now.  Encouraged water intake.  He is not on any antihypertensives.   Ankylosing spondylitis-weekly  methotrexate   History of CVA-continue home aspirin and Plavix   GERD-Protonix   Hyperlipidemia-Crestor   Depression-Wellbutrin  Discharge plan was discussed with patient and/or family member and they verbalized understanding and agreed with it.  Discharge Diagnoses:  Principal Problem:   Ambulatory dysfunction    Discharge Instructions   Allergies as of 02/23/2023        Reactions   Indomethacin Hives   Lactose Intolerance (gi) Nausea And Vomiting, Other (See Comments)   GI UPSET        Medication List     STOP taking these medications    ferrous sulfate 325 (65 FE) MG tablet       TAKE these medications    acetaminophen 325 MG tablet Commonly known as: TYLENOL Take 325-650 mg by mouth every 6 (six) hours as needed for mild pain (pain score 1-3) or headache.   aspirin EC 325 MG tablet Take 1 tablet (325 mg total) by mouth daily.   Biotin 5000 MCG Tabs Take 5,000 mcg by mouth daily.   buPROPion 300 MG 24 hr tablet Commonly known as: WELLBUTRIN XL Take 300 mg by mouth at bedtime.   Calcium Citrate 250 MG Tabs Take 250 mg by mouth daily.   CALCIUM+D3 PO Take 2 tablets by mouth 2 (two) times daily with a meal.   clopidogrel 75 MG tablet Commonly known as: PLAVIX Take 1 tablet (75 mg total) by mouth daily. What changed: when to take this   cyanocobalamin 1000 MCG tablet Commonly known as: VITAMIN B12 Take 1,000 mcg by mouth daily.   fluoruracil 0.5 % cream Commonly known as: CARAC Apply 1 application  topically daily as needed (for cancer treatment- as directed to affected areas).   folic acid 1 MG tablet Commonly known as: FOLVITE Take 1 mg by mouth in the morning, at noon, and at bedtime.   Gemtesa 75 MG Tabs Generic drug: Vibegron Take 75 mg by mouth at bedtime.   ipratropium 0.06 % nasal spray Commonly known as: ATROVENT Place 2 sprays into both nostrils 2 (two) times daily as needed (allergies).   methotrexate 2.5 MG tablet Commonly known as: RHEUMATREX Take 20 mg by mouth every Sunday.   oxyCODONE 5 MG immediate release tablet Commonly known as: Roxicodone Take 1 tablet (5 mg total) by mouth every 6 (six) hours as needed for severe pain (pain score 7-10).   pantoprazole 40 MG tablet Commonly known as: PROTONIX Take 1 tablet (40 mg total) by mouth daily. What changed: when to take this    PRESERVISION AREDS 2 PO Take 1 capsule by mouth in the morning and at bedtime.   rosuvastatin 20 MG tablet Commonly known as: CRESTOR Take 1 tablet (20 mg total) by mouth at bedtime.   Soothe XP Soln Place 1 drop into both eyes 2 (two) times daily as needed (for dryness).   Vitamin D3 1000 units Caps Take 2,000 Units by mouth daily.        Contact information for follow-up providers     [name], MD Follow up in 1 week(s).   Specialty: Internal Medicine Contact information: [address] [place] [place] [address] [place] [address] [phone]              Contact information for after-discharge care     Destination     [facility] Preferred SNF .   Service: Skilled Nursing Contact information: [address] [place] [address] [phone]  Allergies  Allergen Reactions   Indomethacin Hives   Lactose Intolerance (Gi) Nausea And Vomiting and Other (See Comments)    GI UPSET    Consultations: None   Procedures/Studies: CT Head Wo Contrast  Result Date: 02/19/2023 CLINICAL DATA:  Fall from bed, head injury EXAM: CT HEAD WITHOUT CONTRAST CT CERVICAL SPINE WITHOUT CONTRAST TECHNIQUE: Multidetector CT imaging of the head and cervical spine was performed following the standard protocol without intravenous contrast. Multiplanar CT image reconstructions of the cervical spine were also generated. RADIATION DOSE REDUCTION: This exam was performed according to the departmental dose-optimization program which includes automated exposure control, adjustment of the mA and/or kV according to patient size and/or use of iterative reconstruction technique. COMPARISON:  02/14/2023 FINDINGS: CT HEAD FINDINGS Brain: No evidence of acute infarction, hemorrhage, hydrocephalus, extra-axial collection or mass lesion/mass effect. Vascular: No hyperdense vessel or unexpected calcification. Skull: Normal. Negative for fracture or  focal lesion. Sinuses/Orbits: No acute finding. Other: None. CT CERVICAL SPINE FINDINGS Alignment: Normal. Skull base and vertebrae: No acute fracture. No primary bone lesion or focal pathologic process. Soft tissues and spinal canal: No prevertebral fluid or swelling. No visible canal hematoma. Disc levels: Complete bony ankylosis of the skull base, cervical spine, and included upper thoracic spine. Upper chest: Negative. Other: None. IMPRESSION: 1. No acute intracranial pathology. 2. No fracture or static subluxation of the cervical spine. 3. Complete bony ankylosis of the skull base, cervical spine, and included upper thoracic spine. Findings are consistent with advanced ankylosing spondylosis. Electronically Signed   By: Jearld Lesch M.D.   On: 02/19/2023 15:38   CT Cervical Spine Wo Contrast  Result Date: 02/19/2023 CLINICAL DATA:  Fall from bed, head injury EXAM: CT HEAD WITHOUT CONTRAST CT CERVICAL SPINE WITHOUT CONTRAST TECHNIQUE: Multidetector CT imaging of the head and cervical spine was performed following the standard protocol without intravenous contrast. Multiplanar CT image reconstructions of the cervical spine were also generated. RADIATION DOSE REDUCTION: This exam was performed according to the departmental dose-optimization program which includes automated exposure control, adjustment of the mA and/or kV according to patient size and/or use of iterative reconstruction technique. COMPARISON:  02/14/2023 FINDINGS: CT HEAD FINDINGS Brain: No evidence of acute infarction, hemorrhage, hydrocephalus, extra-axial collection or mass lesion/mass effect. Vascular: No hyperdense vessel or unexpected calcification. Skull: Normal. Negative for fracture or focal lesion. Sinuses/Orbits: No acute finding. Other: None. CT CERVICAL SPINE FINDINGS Alignment: Normal. Skull base and vertebrae: No acute fracture. No primary bone lesion or focal pathologic process. Soft tissues and spinal canal: No prevertebral  fluid or swelling. No visible canal hematoma. Disc levels: Complete bony ankylosis of the skull base, cervical spine, and included upper thoracic spine. Upper chest: Negative. Other: None. IMPRESSION: 1. No acute intracranial pathology. 2. No fracture or static subluxation of the cervical spine. 3. Complete bony ankylosis of the skull base, cervical spine, and included upper thoracic spine. Findings are consistent with advanced ankylosing spondylosis. Electronically Signed   By: Jearld Lesch M.D.   On: 02/19/2023 15:38   DG Ribs Unilateral W/Chest Left  Result Date: 02/19/2023 CLINICAL DATA:  Left rib pain after fall. EXAM: LEFT RIBS AND CHEST - 3+ VIEW COMPARISON:  February 04, 2023. FINDINGS: Minimally displaced fractures involving the lateral portions of the left eighth and ninth ribs. There is no evidence of pneumothorax or pleural effusion. Both lungs are clear. Heart size and mediastinal contours are within normal limits. IMPRESSION: Minimally displaced left eighth and ninth rib fractures. Electronically Signed  By: Lupita Raider M.D.   On: 02/19/2023 15:31   DG Hip [place] W or Wo Pelvis 1 View Right  Result Date: 02/19/2023 CLINICAL DATA:  Right hip pain after fall. EXAM: DG HIP (WITH OR WITHOUT PELVIS) 1V PORT RIGHT COMPARISON:  February 17, 2023. FINDINGS: Status post right total hip arthroplasty. No fracture or dislocation is noted. IMPRESSION: No acute abnormality seen. Electronically Signed   By: Lupita Raider M.D.   On: 02/19/2023 15:28   CT HIP RIGHT WO CONTRAST  Result Date: 02/17/2023 CLINICAL DATA:  Right hip arthroplasty dislocation EXAM: CT OF THE RIGHT HIP WITHOUT CONTRAST TECHNIQUE: Multidetector CT imaging of the right hip was performed according to the standard protocol. Multiplanar CT image reconstructions were also generated. RADIATION DOSE REDUCTION: This exam was performed according to the departmental dose-optimization program which includes automated exposure  control, adjustment of the mA and/or kV according to patient size and/or use of iterative reconstruction technique. COMPARISON:  02/13/2023, 02/16/2023 FINDINGS: Bones/Joint/Cartilage Postsurgical changes from right total hip arthroplasty. Arthroplasty components are in their expected alignment. No dislocation. No periprosthetic lucency or fracture. No lytic or sclerotic bone lesion. The right sacroiliac joint is ankylosed. Ligaments Suboptimally assessed by CT. Muscles and Tendons Subcentimeter hyperattenuating focus within the superficial aspect of the right gluteus medius muscle, likely a small site of intramuscular hemorrhage (series 5, image 21). No organized hematoma. Otherwise, no acute abnormality. Soft tissues No periprosthetic fluid collection. No right inguinal lymphadenopathy. Atherosclerotic calcification. IMPRESSION: 1. Postsurgical changes from right total hip arthroplasty. Normal alignment. No evidence of periprosthetic fracture or other hardware complication. 2. Subcentimeter hyperattenuating focus within the right gluteus medius muscle, likely a small site of intramuscular hemorrhage. No organized intramuscular hematoma. Electronically Signed   By: Duanne Guess D.O.   On: 02/17/2023 13:07   DG Pelvis Portable  Result Date: 02/16/2023 CLINICAL DATA:  Reduction of right hip dislocation EXAM: PORTABLE PELVIS 1-2 VIEWS COMPARISON:  02/16/2023 at 6:36 p.m. FINDINGS: Frontal view of the pelvis was obtained at 8:14 p.m. There is anatomic alignment of the right hip arthroplasty after reduction. No evidence of acute fracture. Stable left hip osteoarthritis. IMPRESSION: 1. Reduction of prior right hip arthroplasty dislocation, with anatomic alignment on this exam. No acute fracture. Electronically Signed   By: Sharlet Salina M.D.   On: 02/16/2023 20:57   DG Femur 1 View Right  Result Date: 02/16/2023 CLINICAL DATA:  History of right hip dislocation EXAM: RIGHT FEMUR 1 VIEW COMPARISON:  02/13/2023  FINDINGS: Cross-table lateral views of the right femur are obtained. Evaluation of the right hip is limited due to overlapping structures. Right knee osteoarthritis is noted. The soft tissues are normal. IMPRESSION: 1. Nondiagnostic cross-table lateral view of the right hip due to overlying structures. Please see corresponding pelvic x-ray. 2. Right knee osteoarthritis. Electronically Signed   By: Sharlet Salina M.D.   On: 02/16/2023 20:56   DG Pelvis Portable  Result Date: 02/16/2023 CLINICAL DATA:  Clinical concern for dislocation or fracture. EXAM: PORTABLE PELVIS 1-2 VIEWS COMPARISON:  02/14/2023. FINDINGS: Total arthroplasty changes are noted on the right. There is superior dislocation of the right femoral head component. No evidence of acute fracture or hardware loosening is seen. Moderate degenerative changes are noted at the left hip. There are degenerative changes in the lower lumbar spine with findings suggestive of ankylosing spondylitis. IMPRESSION: Status post total hip arthroplasty on the right with superior dislocation of the femoral head component. Electronically Signed   By:  Thornell Sartorius M.D.   On: 02/16/2023 20:55   DG Pelvis 1-2 Views  Result Date: 02/14/2023 CLINICAL DATA:  Presented with right hip arthroplasty dislocation, evaluate postreduction. EXAM: PELVIS - 1-2 VIEW COMPARISON:  Earlier study 02/13/23 at 11:52 p.m. FINDINGS: Single AP view demonstrates interval relocation of the right lower extremity femoral arthroplasty to the acetabular cup prosthesis. Osteopenia and degenerative change again noted. Extensive bridging syndesmophytes lower lumbar spine with partial ankylosis over the SI joints. IMPRESSION: Interval relocation of the right lower extremity femoral arthroplasty to the acetabular cup prosthesis. Electronically Signed   By: Almira Bar M.D.   On: 02/14/2023 03:13   CT Head Wo Contrast  Result Date: 02/14/2023 CLINICAL DATA:  Head trauma, minor (Age >= 65y); Neck  trauma (Age >= 65y), fall EXAM: CT HEAD WITHOUT CONTRAST CT CERVICAL SPINE WITHOUT CONTRAST TECHNIQUE: Multidetector CT imaging of the head and cervical spine was performed following the standard protocol without intravenous contrast. Multiplanar CT image reconstructions of the cervical spine were also generated. RADIATION DOSE REDUCTION: This exam was performed according to the departmental dose-optimization program which includes automated exposure control, adjustment of the mA and/or kV according to patient size and/or use of iterative reconstruction technique. COMPARISON:  None Available. FINDINGS: CT HEAD FINDINGS Brain: No evidence of acute infarction, hemorrhage, hydrocephalus, extra-axial collection or mass lesion/mass effect. Mild to moderate global parenchymal volume loss is stable since prior examination and commensurate with the patient's age. Vascular: No hyperdense vessel or unexpected calcification. Skull: Normal. Negative for fracture or focal lesion. Sinuses/Orbits: No acute finding. Other: Mastoid air cells and middle ear cavities are clear. CT CERVICAL SPINE FINDINGS Alignment: There is straightening of the cervical spine. No listhesis. Skull base and vertebrae: There is ankylosis of the craniocervical articulations, atlantoaxial articulations, and vertebral bodies and facet joints throughout the visualized thoracolumbar spine in keeping with changes of ankylosing spondylitis. No acute fracture of the cervical spine. Vertebral body height is preserved. Soft tissues and spinal canal: No prevertebral fluid or swelling. No visible canal hematoma. Disc levels: Intervertebral disc heights are preserved though, as noted above, there is solid ankylosis of the vertebral column related to changes of ankylosing spondylitis. No high-grade canal stenosis. No significant neuroforaminal narrowing. The prevertebral soft tissues are not thickened on sagittal reformats. Upper chest: Negative. Other: Left carotid  stenting has been performed with narrowing of the stent in the area of the carotid bulb with resultant stenosis approaching at least 50%, not well characterized on this noncontrast examination. IMPRESSION: 1. No acute intracranial abnormality. No calvarial fracture. 2. No acute fracture or listhesis of the cervical spine. 3. Changes of ankylosing spondylitis. 4. Left carotid stenting has been performed with narrowing of the stent in the area of the carotid bulb with resultant stenosis approaching at least 50%, not well characterized on this noncontrast examination. This could be better assessed with dedicated carotid Doppler ultrasound. Electronically Signed   By: Helyn Numbers M.D.   On: 02/14/2023 00:44   CT Cervical Spine Wo Contrast  Result Date: 02/14/2023 CLINICAL DATA:  Head trauma, minor (Age >= 65y); Neck trauma (Age >= 65y), fall EXAM: CT HEAD WITHOUT CONTRAST CT CERVICAL SPINE WITHOUT CONTRAST TECHNIQUE: Multidetector CT imaging of the head and cervical spine was performed following the standard protocol without intravenous contrast. Multiplanar CT image reconstructions of the cervical spine were also generated. RADIATION DOSE REDUCTION: This exam was performed according to the departmental dose-optimization program which includes automated exposure control, adjustment of the mA and/or  kV according to patient size and/or use of iterative reconstruction technique. COMPARISON:  None Available. FINDINGS: CT HEAD FINDINGS Brain: No evidence of acute infarction, hemorrhage, hydrocephalus, extra-axial collection or mass lesion/mass effect. Mild to moderate global parenchymal volume loss is stable since prior examination and commensurate with the patient's age. Vascular: No hyperdense vessel or unexpected calcification. Skull: Normal. Negative for fracture or focal lesion. Sinuses/Orbits: No acute finding. Other: Mastoid air cells and middle ear cavities are clear. CT CERVICAL SPINE FINDINGS Alignment:  There is straightening of the cervical spine. No listhesis. Skull base and vertebrae: There is ankylosis of the craniocervical articulations, atlantoaxial articulations, and vertebral bodies and facet joints throughout the visualized thoracolumbar spine in keeping with changes of ankylosing spondylitis. No acute fracture of the cervical spine. Vertebral body height is preserved. Soft tissues and spinal canal: No prevertebral fluid or swelling. No visible canal hematoma. Disc levels: Intervertebral disc heights are preserved though, as noted above, there is solid ankylosis of the vertebral column related to changes of ankylosing spondylitis. No high-grade canal stenosis. No significant neuroforaminal narrowing. The prevertebral soft tissues are not thickened on sagittal reformats. Upper chest: Negative. Other: Left carotid stenting has been performed with narrowing of the stent in the area of the carotid bulb with resultant stenosis approaching at least 50%, not well characterized on this noncontrast examination. IMPRESSION: 1. No acute intracranial abnormality. No calvarial fracture. 2. No acute fracture or listhesis of the cervical spine. 3. Changes of ankylosing spondylitis. 4. Left carotid stenting has been performed with narrowing of the stent in the area of the carotid bulb with resultant stenosis approaching at least 50%, not well characterized on this noncontrast examination. This could be better assessed with dedicated carotid Doppler ultrasound. Electronically Signed   By: Helyn Numbers M.D.   On: 02/14/2023 00:44   DG HIP UNILAT WITH PELVIS 2-3 VIEWS RIGHT  Result Date: 02/14/2023 CLINICAL DATA:  Fall with right hip prosthesis dislocation. [id number] with right hip pain. EXAM: DG HIP (WITH OR WITHOUT PELVIS) 2-3V RIGHT COMPARISON:  Study of 06/11/2020 FINDINGS: There is right hip total joint arthroplasty with cephalad dislocated femoral component. No findings suspicious for fractures or hardware  loosening. There is osteopenia. Moderate joint space loss left hip. No pelvic fracture or diastasis. At least partial ankylosis again is shown about the superior SI joints. Pubic symphysis is patent. Scrotal surgical clips suggesting prior vasectomy. Soft tissues are otherwise unremarkable. IMPRESSION: 1. Right hip total joint arthroplasty with cephalad dislocated femoral component. 2. Osteopenia. 3. No findings suspicious for fractures or hardware loosening. 4. Moderate joint space loss left hip. 5. At least partial ankylosis about the superior SI joints. Electronically Signed   By: Almira Bar M.D.   On: 02/14/2023 00:12   DG Chest 2 View  Result Date: 02/04/2023 CLINICAL DATA:  Cough and fever.  Congestion. EXAM: CHEST - 2 VIEW COMPARISON:  Radiograph 07/11/2022, CT 08/14/2023 FINDINGS: Patchy airspace disease in the right mid lung suspicious for pneumonia. The heart is normal in size. Aortic atherosclerosis. No pleural effusion, pulmonary edema, or pneumothorax. No evidence of acute osseous abnormality. IMPRESSION: Patchy airspace disease in the right mid lung suspicious for pneumonia. Recommend radiographic follow-up to resolution. Electronically Signed   By: Narda Rutherford M.D.   On: 02/04/2023 16:41   MR BRAIN WO CONTRAST  Result Date: 01/27/2023 Table formatting from the original result was not included. [facility] NEUROIMAGING REPORT STUDY DATE: 01/26/23 PATIENT NAME: Steven [name] DOB: 09-05-1934 MRN: [id number]  ORDERING CLINICIAN: Windell Norfolk, MD CLINICAL HISTORY: 88 y.o. year old male with: 1. Dizziness   EXAM: MR BRAIN WO CONTRAST TECHNIQUE: MRI of the brain with and without contrast was obtained utilizing multiplanar, multiecho pulse sequences. CONTRAST: Diagnostic Product Medications (last 72 hours)   None   COMPARISON: 03/28/21 MRI IMAGING SITE:  IMAGING [facility]  MRI Imaging [address] [place] [address] FINDINGS: No abnormal  lesions are seen on diffusion-weighted views to suggest acute ischemia. The cortical sulci, fissures and cisterns are mild biparietal and perisylvian atrophy.  Lateral, third and fourth ventricle are normal in size and appearance. No extra-axial fluid collections are seen. No evidence of mass effect or midline shift.  Mild periventricular and subcortical chronic small vessel ischemic disease.  No abnormal lesions are seen on post contrast views.  On sagittal views the posterior fossa, pituitary gland and corpus callosum are unremarkable. No evidence of intracranial hemorrhage on SWI views. The orbits and their contents, paranasal sinuses and calvarium are notable for bilateral lens extractions. Intracranial flow voids are present.   MRI brain (without) demonstrating: -Mild atrophy and mild chronic small vessel ischemic disease. -No acute findings. INTERPRETING PHYSICIAN: Suanne Marker, MD Certified in Neurology, Neurophysiology and Neuroimaging [facility] [address] [place], [place] [address] [phone]    Discharge Exam: Vitals:   02/22/23 2009 02/23/23 0456  BP: (!) 139/127 (!) 113/55  Pulse: 81 85  Resp: 16 16  Temp: 99 F (37.2 C) 99 F (37.2 C)  SpO2: 100% 97%   Vitals:   02/22/23 0449 02/22/23 1533 02/22/23 2009 02/23/23 0456  BP: (!) 113/53 (!) 115/54 (!) 139/127 (!) 113/55  Pulse: 72 81 81 85  Resp: 15  16 16   Temp: 97.9 F (36.6 C) 98.6 F (37 C) 99 F (37.2 C) 99 F (37.2 C)  TempSrc:  Oral Oral Oral  SpO2: 98% 100% 100% 97%  Weight:      Height:        General: Pt is alert, awake, not in acute distress Cardiovascular: RRR, S1/S2 +, no rubs, no gallops Respiratory: CTA bilaterally, no wheezing, no rhonchi Abdominal: Soft, NT, ND, bowel sounds + Extremities: no edema, no cyanosis    The results of significant diagnostics from this hospitalization (including imaging, microbiology, ancillary and laboratory) are listed below for  reference.     Microbiology: No results found for this or any previous visit (from the past 240 hour(s)).   Labs: BNP (last 3 results) Recent Labs    07/11/22 1540  BNP 142.7*   Basic Metabolic Panel: Recent Labs  Lab 02/16/23 1845 02/19/23 1509 02/20/23 0715 02/21/23 0648 02/23/23 0846  NA 135 136 134* 132* 132*  K 6.0* 4.3 4.6 3.6 4.1  CL 104 102 99 100 98  CO2 23 26 28 25 26   GLUCOSE 190* 154* 123* 132* 129*  BUN 16 18 15 16 14   CREATININE 0.81 0.72 0.82 0.72 0.78  CALCIUM 8.4* 8.4* 8.7* 8.2* 8.4*   Liver Function Tests: No results for input(s): "AST", "ALT", "ALKPHOS", "BILITOT", "PROT", "ALBUMIN" in the last 168 hours. No results for input(s): "LIPASE", "AMYLASE" in the last 168 hours. No results for input(s): "AMMONIA" in the last 168 hours. CBC: Recent Labs  Lab 02/16/23 1845 02/19/23 1509 02/20/23 0715 02/21/23 0648  WBC 9.2 10.3 8.7 8.8  NEUTROABS 8.5* 8.5*  --  6.6  HGB 10.5* 9.2* 10.3* 10.0*  HCT 32.3* 27.8* 31.6* 30.9*  MCV 101.3*  100.4* 99.7 100.3*  PLT 478* 372 416* 363   Cardiac Enzymes: No results for input(s): "CKTOTAL", "CKMB", "CKMBINDEX", "TROPONINI" in the last 168 hours. BNP: Invalid input(s): "POCBNP" CBG: No results for input(s): "GLUCAP" in the last 168 hours. D-Dimer No results for input(s): "DDIMER" in the last 72 hours. Hgb A1c No results for input(s): "HGBA1C" in the last 72 hours. Lipid Profile No results for input(s): "CHOL", "HDL", "LDLCALC", "TRIG", "CHOLHDL", "LDLDIRECT" in the last 72 hours. Thyroid function studies No results for input(s): "TSH", "T4TOTAL", "T3FREE", "THYROIDAB" in the last 72 hours.  Invalid input(s): "FREET3" Anemia work up No results for input(s): "VITAMINB12", "FOLATE", "FERRITIN", "TIBC", "IRON", "RETICCTPCT" in the last 72 hours. Urinalysis    Component Value Date/Time   COLORURINE YELLOW 02/04/2023 1302   APPEARANCEUR CLEAR 02/04/2023 1302   APPEARANCEUR Hazy 04/18/2012 2324   LABSPEC  1.013 02/04/2023 1302   LABSPEC 1.018 04/18/2012 2324   PHURINE 7.0 02/04/2023 1302   GLUCOSEU NEGATIVE 02/04/2023 1302   GLUCOSEU Negative 04/18/2012 2324   HGBUR NEGATIVE 02/04/2023 1302   BILIRUBINUR NEGATIVE 02/04/2023 1302   BILIRUBINUR Negative 04/18/2012 2324   KETONESUR NEGATIVE 02/04/2023 1302   PROTEINUR NEGATIVE 02/04/2023 1302   UROBILINOGEN 0.2 04/09/2014 2138   NITRITE NEGATIVE 02/04/2023 1302   LEUKOCYTESUR NEGATIVE 02/04/2023 1302   LEUKOCYTESUR Negative 04/18/2012 2324   Sepsis Labs Recent Labs  Lab 02/16/23 1845 02/19/23 1509 02/20/23 0715 02/21/23 0648  WBC 9.2 10.3 8.7 8.8   Microbiology No results found for this or any previous visit (from the past 240 hour(s)).  FURTHER DISCHARGE INSTRUCTIONS:   Get Medicines reviewed and adjusted: Please take all your medications with you for your next visit with your Primary MD   Laboratory/radiological data: Please request your Primary MD to go over all hospital tests and procedure/radiological results at the follow up, please ask your Primary MD to get all Hospital records sent to his/her office.   In some cases, they will be blood work, cultures and biopsy results pending at the time of your discharge. Please request that your primary care M.D. goes through all the records of your hospital data and follows up on these results.   Also Note the following: If you experience worsening of your admission symptoms, develop shortness of breath, life threatening emergency, suicidal or homicidal thoughts you must seek medical attention immediately by calling 911 or calling your MD immediately  if symptoms less severe.   You must read complete instructions/literature along with all the possible adverse reactions/side effects for all the Medicines you take and that have been prescribed to you. Take any new Medicines after you have completely understood and accpet all the possible adverse reactions/side effects.    Do not drive  when taking Pain medications or sleeping medications (Benzodaizepines)   Do not take more than prescribed Pain, Sleep and Anxiety Medications. It is not advisable to combine anxiety,sleep and pain medications without talking with your primary care practitioner   Special Instructions: If you have smoked or chewed Tobacco  in the last 2 yrs please stop smoking, stop any regular Alcohol  and or any Recreational drug use.   Wear Seat belts while driving.   Please note: You were cared for by a hospitalist during your hospital stay. Once you are discharged, your primary care physician will handle any further medical issues. Please note that NO REFILLS for any discharge medications will be authorized once you are discharged, as it is imperative that you return to your primary  care physician (or establish a relationship with a primary care physician if you do not have one) for your post hospital discharge needs so that they can reassess your need for medications and monitor your lab values  Time coordinating discharge: Over 30 minutes  SIGNED:   Hughie Closs, MD  [facility] 02/23/2023, 10:48 AM *Please note that this is a verbal dictation therefore any spelling or grammatical errors are due to the "Dragon Medical One" system interpretation. If 7PM-7AM, please contact night-coverage [email or web address]

## 2023-02-23 NOTE — Plan of Care (Signed)
  Problem: Clinical Measurements: Goal: Will remain free from infection Outcome: Progressing   Problem: Activity: Goal: Risk for activity intolerance will decrease Outcome: Progressing   Problem: Pain Management: Goal: General experience of comfort will improve Outcome: Progressing   Problem: Safety: Goal: Ability to remain free from injury will improve Outcome: Progressing

## 2023-02-23 NOTE — TOC Progression Note (Signed)
Transition of Care [facility] - Progression Note    Patient Details  Name: Steven [name] MRN: [id number] Date of Birth: 21-Oct-1934  Transition of Care [facility]) CM/SW Contact  Otelia Santee, LCSW Phone Number: 02/23/2023, 4:04 PM  Clinical Narrative:    Per RN, pt son and spouse at bedside requesting to speak to CSW. Pt's son states that [facility] MD unable to meet with pt at home until this weekend. Pt's son states pt cannot wait until this time and is unsafe to return home. They would like pt to go to SNF however, not to the SNF they accepted the day before. Family have now accepted bed offer for [facility]. Insurance auth location has been changed from [facility] to [employer].    Expected Discharge Plan: Skilled Nursing Facility Barriers to Discharge: Family Issues  Expected Discharge Plan and Services       Living arrangements for the past 2 months: Single Family Home Expected Discharge Date: 02/23/23                         HH Arranged: PT HH Agency: [facility] Date [facility] Agency Contacted: 02/23/23 Time HH Agency Contacted: [phone] Representative spoke with at [facility] Agency: Cindie   Social Determinants of Health (SDOH) Interventions SDOH Screenings   Food Insecurity: No Food Insecurity (02/19/2023)  Housing: Low Risk  (02/19/2023)  Transportation Needs: No Transportation Needs (02/19/2023)  Utilities: Not At Risk (02/19/2023)  Financial Resource Strain: Low Risk  (03/09/2019)  Physical Activity: Insufficiently Active (03/09/2019)  Social Connections: Unknown (03/09/2019)  Stress: No Stress Concern Present (03/09/2019)  Tobacco Use: Medium Risk (02/19/2023)    Readmission Risk Interventions    02/23/2023   12:29 PM  Readmission Risk Prevention Plan  Transportation Screening Complete  PCP or Specialist Appt within 3-5 Days Complete  HRI or Home Care Consult Complete  Social Work Consult for Recovery Care Planning/Counseling Complete  Palliative Care Screening  Not Applicable  Medication Review Oceanographer) Complete

## 2023-02-23 NOTE — Plan of Care (Signed)
  Problem: Education: Goal: Knowledge of General Education information will improve Description: Including pain rating scale, medication(s)/side effects and non-pharmacologic comfort measures Outcome: Progressing   Problem: Activity: Goal: Risk for activity intolerance will decrease Outcome: Progressing   Problem: Coping: Goal: Level of anxiety will decrease Outcome: Progressing   Problem: Pain Management: Goal: General experience of comfort will improve Outcome: Progressing   Problem: Safety: Goal: Ability to remain free from injury will improve Outcome: Progressing   Problem: Skin Integrity: Goal: Risk for impaired skin integrity will decrease Outcome: Progressing

## 2023-02-23 NOTE — Consult Note (Signed)
[facility] Liaison Consult Note   02/23/2023  Steven [name] Oct 17, 1934 [id number]   *[facility] [[facility]I] remote coverage review for patient admitted to [facility]   Primary Care Provider:  [name], MD with [facility] which is listed for the transition of care follow up  Insurance: [employer]  Patient was reviewed for less than 30 day readmission high risk score 4 day length of stay and to review for barriers to care in the community.  Patient was screened for hospitalization and on behalf of [facility]  Care Coordination to assess for post hospital community care needs.  Patient is being considered for a skilled nursing facility level of care for post hospital transition. Recent hip dislocation and fail injury.  If the patient goes to a VBCI affiliated facility then, patient can be followed by [facility] RN with traditional Medicare and approved Medicare Advantage plans.    Plan:  If transitions to affiliated facility, then will notify the Community [facility] RN can follow for any known or needs for transitional care needs for returning to post facility care coordination needs to return to community. Will follow for final disposition.  For questions or referrals, please contact:  Charlesetta Shanks, RN, BSN, CCM [place]  [facility] Liaison Direct Dial: [phone] or secure chat Email: [email or web address]

## 2023-02-23 NOTE — Progress Notes (Signed)
[facility] liaison note  Family reached out to [facility] with request that patient be followed at home for outpatient palliative and home based primary care with likely discharge tomorrow.  Liaison will continue to follow for needs.   Thank your for the opportunity to participate in this patient's plan of care Thea Gist, BSN [facility] liaison [phone]

## 2023-02-23 NOTE — TOC Progression Note (Signed)
Transition of Care [facility]) - Progression Note    Patient Details  Name: Steven [name] MRN: [id number] Date of Birth: [birth year]  Transition of Care [facility]) CM/SW Contact  Otelia Santee, LCSW Phone Number: 02/23/2023, 12:41 PM  Clinical Narrative:    Pt's insurance approved for SNF with plan for pt to transfer to [facility] today. Spoke with pt's son who shared that pt is not going to [facility] and that they have referred pt to [facility]. Per Thea Gist with [facility] pt has been referred to their palliative care services. Pt's son shares they plan to take pt home. Pt's son states they are putting a ramp into the house. He also states that it is not safe for pt to return home today as pt's spouse will be unable to pick him up if he falls. Pt's son is seeking personal care services and is agreeable to home health being arranged. HHPT has been arranged with [facility]. HHPT order will need to be placed. No DME needs identified.    Expected Discharge Plan: Skilled Nursing Facility Barriers to Discharge: Family Issues  Expected Discharge Plan and Services       Living arrangements for the past 2 months: Single Family Home Expected Discharge Date: 02/23/23                         HH Arranged: PT HH Agency: [facility] Date [facility] Agency Contacted: 02/23/23 Time HH Agency Contacted: 1241 Representative spoke with at [facility] Agency: Cindie   Social Determinants of Health (SDOH) Interventions SDOH Screenings   Food Insecurity: No Food Insecurity (02/19/2023)  Housing: Low Risk  (02/19/2023)  Transportation Needs: No Transportation Needs (02/19/2023)  Utilities: Not At Risk (02/19/2023)  Financial Resource Strain: Low Risk  (03/09/2019)  Physical Activity: Insufficiently Active (03/09/2019)  Social Connections: Unknown (03/09/2019)  Stress: No Stress Concern Present (03/09/2019)  Tobacco Use: Medium Risk (02/19/2023)    Readmission Risk Interventions    02/23/2023   12:29 PM  Readmission  Risk Prevention Plan  Transportation Screening Complete  PCP or Specialist Appt within 3-5 Days Complete  HRI or Home Care Consult Complete  Social Work Consult for Recovery Care Planning/Counseling Complete  Palliative Care Screening Not Applicable  Medication Review Oceanographer) Complete

## 2023-02-23 NOTE — Plan of Care (Signed)
Patient has been waiting in the hospital for bed placement at [facility] and insurance approval for last 2 to 3 days and while we were waiting and hoping that we will get insurance authorization today, in anticipation, discharge was also completed however family has now changed their mind and they do not want him to go to SNF and instead they want him to come home with hospice support and caregiver support.  Hospice has been involved and they can see him however family is requesting some time to arrange caregiver and has requested to possibly discharge tomorrow.  Discharge canceled.  TOC is working with the family.

## 2023-02-23 NOTE — Progress Notes (Signed)
OT Cancellation Note  Patient Details Name: ALSTON NESTOR MRN: [id number] DOB: [birth year]   Cancelled Treatment:    Reason Eval/Treat Not Completed: Pain limiting ability to participate. Pt received in bed, groaning and grimacing. Requesting pain meds, 10/10 pain in R hip. OT will hold tx at this time.  Kalise Fickett L. Oluwatomisin Hustead, OTR/L  02/23/23, 11:45 AM

## 2023-02-24 DIAGNOSIS — Z9049 Acquired absence of other specified parts of digestive tract: Secondary | ICD-10-CM | POA: Diagnosis not present

## 2023-02-24 DIAGNOSIS — R69 Illness, unspecified: Secondary | ICD-10-CM | POA: Diagnosis not present

## 2023-02-24 DIAGNOSIS — S2232XA Fracture of one rib, left side, initial encounter for closed fracture: Secondary | ICD-10-CM

## 2023-02-24 DIAGNOSIS — M459 Ankylosing spondylitis of unspecified sites in spine: Secondary | ICD-10-CM | POA: Diagnosis not present

## 2023-02-24 DIAGNOSIS — I951 Orthostatic hypotension: Secondary | ICD-10-CM | POA: Diagnosis not present

## 2023-02-24 DIAGNOSIS — M6281 Muscle weakness (generalized): Secondary | ICD-10-CM | POA: Diagnosis not present

## 2023-02-24 DIAGNOSIS — E739 Lactose intolerance, unspecified: Secondary | ICD-10-CM | POA: Diagnosis not present

## 2023-02-24 DIAGNOSIS — S73004A Unspecified dislocation of right hip, initial encounter: Secondary | ICD-10-CM

## 2023-02-24 DIAGNOSIS — Z7902 Long term (current) use of antithrombotics/antiplatelets: Secondary | ICD-10-CM | POA: Diagnosis not present

## 2023-02-24 DIAGNOSIS — Z9181 History of falling: Secondary | ICD-10-CM | POA: Diagnosis not present

## 2023-02-24 DIAGNOSIS — Z87442 Personal history of urinary calculi: Secondary | ICD-10-CM | POA: Diagnosis not present

## 2023-02-24 DIAGNOSIS — M24451 Recurrent dislocation, right hip: Secondary | ICD-10-CM | POA: Diagnosis not present

## 2023-02-24 DIAGNOSIS — R531 Weakness: Secondary | ICD-10-CM | POA: Diagnosis not present

## 2023-02-24 DIAGNOSIS — Z803 Family history of malignant neoplasm of breast: Secondary | ICD-10-CM | POA: Diagnosis not present

## 2023-02-24 DIAGNOSIS — S0100XA Unspecified open wound of scalp, initial encounter: Secondary | ICD-10-CM | POA: Diagnosis not present

## 2023-02-24 DIAGNOSIS — Z8249 Family history of ischemic heart disease and other diseases of the circulatory system: Secondary | ICD-10-CM | POA: Diagnosis not present

## 2023-02-24 DIAGNOSIS — Z8673 Personal history of transient ischemic attack (TIA), and cerebral infarction without residual deficits: Secondary | ICD-10-CM | POA: Diagnosis not present

## 2023-02-24 DIAGNOSIS — Z79899 Other long term (current) drug therapy: Secondary | ICD-10-CM | POA: Diagnosis not present

## 2023-02-24 DIAGNOSIS — E860 Dehydration: Secondary | ICD-10-CM | POA: Diagnosis not present

## 2023-02-24 DIAGNOSIS — E871 Hypo-osmolality and hyponatremia: Secondary | ICD-10-CM | POA: Diagnosis not present

## 2023-02-24 DIAGNOSIS — D509 Iron deficiency anemia, unspecified: Secondary | ICD-10-CM | POA: Diagnosis not present

## 2023-02-24 DIAGNOSIS — I63232 Cerebral infarction due to unspecified occlusion or stenosis of left carotid arteries: Secondary | ICD-10-CM | POA: Diagnosis not present

## 2023-02-24 DIAGNOSIS — K219 Gastro-esophageal reflux disease without esophagitis: Secondary | ICD-10-CM | POA: Diagnosis not present

## 2023-02-24 DIAGNOSIS — F329 Major depressive disorder, single episode, unspecified: Secondary | ICD-10-CM

## 2023-02-24 DIAGNOSIS — S0101XD Laceration without foreign body of scalp, subsequent encounter: Secondary | ICD-10-CM | POA: Diagnosis not present

## 2023-02-24 DIAGNOSIS — W19XXXA Unspecified fall, initial encounter: Secondary | ICD-10-CM | POA: Diagnosis not present

## 2023-02-24 DIAGNOSIS — Z7982 Long term (current) use of aspirin: Secondary | ICD-10-CM | POA: Diagnosis not present

## 2023-02-24 DIAGNOSIS — Z800 Family history of malignant neoplasm of digestive organs: Secondary | ICD-10-CM | POA: Diagnosis not present

## 2023-02-24 DIAGNOSIS — E861 Hypovolemia: Secondary | ICD-10-CM | POA: Diagnosis not present

## 2023-02-24 DIAGNOSIS — Z7401 Bed confinement status: Secondary | ICD-10-CM | POA: Diagnosis not present

## 2023-02-24 DIAGNOSIS — Z743 Need for continuous supervision: Secondary | ICD-10-CM | POA: Diagnosis not present

## 2023-02-24 DIAGNOSIS — E876 Hypokalemia: Secondary | ICD-10-CM | POA: Diagnosis not present

## 2023-02-24 DIAGNOSIS — Z833 Family history of diabetes mellitus: Secondary | ICD-10-CM | POA: Diagnosis not present

## 2023-02-24 DIAGNOSIS — Y92009 Unspecified place in unspecified non-institutional (private) residence as the place of occurrence of the external cause: Secondary | ICD-10-CM | POA: Diagnosis not present

## 2023-02-24 DIAGNOSIS — Z96641 Presence of right artificial hip joint: Secondary | ICD-10-CM | POA: Diagnosis not present

## 2023-02-24 DIAGNOSIS — J9811 Atelectasis: Secondary | ICD-10-CM | POA: Diagnosis not present

## 2023-02-24 DIAGNOSIS — E222 Syndrome of inappropriate secretion of antidiuretic hormone: Secondary | ICD-10-CM | POA: Diagnosis not present

## 2023-02-24 DIAGNOSIS — R278 Other lack of coordination: Secondary | ICD-10-CM | POA: Diagnosis not present

## 2023-02-24 DIAGNOSIS — G8911 Acute pain due to trauma: Secondary | ICD-10-CM | POA: Diagnosis not present

## 2023-02-24 DIAGNOSIS — S73004D Unspecified dislocation of right hip, subsequent encounter: Secondary | ICD-10-CM

## 2023-02-24 DIAGNOSIS — E785 Hyperlipidemia, unspecified: Secondary | ICD-10-CM | POA: Diagnosis not present

## 2023-02-24 DIAGNOSIS — M6259 Muscle wasting and atrophy, not elsewhere classified, multiple sites: Secondary | ICD-10-CM | POA: Diagnosis not present

## 2023-02-24 DIAGNOSIS — I4510 Unspecified right bundle-branch block: Secondary | ICD-10-CM | POA: Diagnosis not present

## 2023-02-24 DIAGNOSIS — Z87891 Personal history of nicotine dependence: Secondary | ICD-10-CM | POA: Diagnosis not present

## 2023-02-24 NOTE — Progress Notes (Signed)
OT Cancellation Note  Patient Details Name: [name] MRN: [id number] DOB: Jun 11, 1934   Cancelled Treatment:    Reason Eval/Treat Not Completed: Medical issues which prohibited therapy (orthostatic with PT). OT will continue to follow acutely as schedule allows.  [name] Keelie Zemanek 02/24/2023, 11:57 AM  Nyoka Cowden OTR/L Acute Rehabilitation Services Office: [phone]

## 2023-02-24 NOTE — Progress Notes (Signed)
Physical Therapy Treatment Patient Details Name: Steven [name] MRN: [id number] DOB: 1935-04-09 Today's Date: 02/24/2023   History of Present Illness Patient is a 88 year old male who presented to the hospital after a fall at home attempting to get out of bed. Patient was admitted with L side rib fractures and head laceration. Pt with hx of 2 R hip dislocations this month treated in the ED and outpt ortho scheduled.  He has recently been in [place] and reports difficulty with mobility due to [facility].  Ortho consulted and placed order for hip abduction brace. PMH: ankylosing spondylitis, TIA, HLD, anemia, R THA revision 2018, recent hip dislocations 11/2 and 11/5.    PT Comments  Pt asleep upon arrival, easily wakes, and agreeable to therapy. Pt supine with hip abduction brace on upon arrival. Pt tolerates supine exercises without complaints, verbal cues for muscle activation and controlled motion. Pt needing mod A to upright trunk and come to sitting EOB, once seated pt with fair static sitting balance with UE support. Pt reports some dizziness in sitting that somewhat improves, then reports dizziness in standing. BP recorded below, pt noted to be orthostatic with mobility; assisted back to supine and notified RN.  Sitting 91/52 HR 82 Standing 72/46 HR 89 Return to supine 137/76 HR 79   If plan is discharge home, recommend the following: A little help with walking and/or transfers;A little help with bathing/dressing/bathroom;Assistance with cooking/housework;Assist for transportation   Can travel by private vehicle     Yes  Equipment Recommendations  None recommended by PT    Recommendations for Other Services       Precautions / Restrictions Precautions Precautions: Fall;Posterior Hip Precaution Comments: posterior hip precautions reviewed due to history of posterior revision with dislocations Required Braces or Orthoses: Other Brace Other Brace: hip abd brace on upon therapist's arrival, no  order in chart     Mobility  Bed Mobility Overal bed mobility: Needs Assistance Bed Mobility: Supine to Sit, Sit to Supine     Supine to sit: Mod assist, HOB elevated, Used rails Sit to supine: Min assist   General bed mobility comments: mod A with use of bedrail and HOB elevated, inching to EOB with significant assist at trunk; min A to return to supine in bed to lift BLE into bed, pt able to lift hips to position in bed    Transfers Overall transfer level: Needs assistance Equipment used: Rolling walker (2 wheels) Transfers: Sit to/from Stand Sit to Stand: Min assist, From elevated surface           General transfer comment: min A to power up from EOB, verbal cues for posterior hip precautions with mobility; limited by dizziness, noted to be orthostatic so returned to supine    Ambulation/Gait                   Stairs             Wheelchair Mobility     Tilt Bed    Modified Rankin (Stroke Patients Only)       Balance Overall balance assessment: Needs assistance, History of Falls Sitting-balance support: Feet supported, Bilateral upper extremity supported Sitting balance-[name]: Fair     Standing balance support: Reliant on assistive device for balance, During functional activity Standing balance-[name]: Fair Standing balance comment: static without UE support  Cognition Arousal: Alert Behavior During Therapy: WFL for tasks assessed/performed Overall Cognitive Status: Within Functional Limits for tasks assessed                                          Exercises General Exercises - Lower Extremity Ankle Circles/Pumps: AROM, Both, 10 reps, Supine Quad Sets: AROM, Strengthening, Both, Supine Hip ABduction/ADduction: AROM, Strengthening, Both, 10 reps, Supine (isometric hip abd) Straight Leg Raises: AROM, Strengthening, Both, 10 reps, Supine    General Comments         Pertinent Vitals/Pain Pain Assessment Pain Assessment: No/denies pain    Home Living                          Prior Function            PT Goals (current goals can now be found in the care plan section) Progress towards PT goals: Progressing toward goals    Frequency    Min 1X/week      PT Plan      Co-evaluation              AM-PAC PT "6 Clicks" Mobility   Outcome Measure  Help needed turning from your back to your side while in a flat bed without using bedrails?: A Little Help needed moving from lying on your back to sitting on the side of a flat bed without using bedrails?: A Little Help needed moving to and from a bed to a chair (including a wheelchair)?: A Little Help needed standing up from a chair using your arms (e.g., wheelchair or bedside chair)?: A Little Help needed to walk in hospital room?: A Lot Help needed climbing 3-5 steps with a railing? : A Lot 6 Click Score: 16    End of Session Equipment Utilized During Treatment: Gait belt;Other (comment) (hip abd brace) Activity Tolerance: Treatment limited secondary to medical complications (Comment) (orthostatic) Patient left: in bed;with call bell/phone within reach;with bed alarm set Nurse Communication: Mobility status;Other (comment) (BP) PT Visit Diagnosis: Unsteadiness on feet (R26.81);Other abnormalities of gait and mobility (R26.89);Repeated falls (R29.6);Muscle weakness (generalized) (M62.81);Difficulty in walking, not elsewhere classified (R26.2)     Time: 4098-1191 PT Time Calculation (min) (ACUTE ONLY): 29 min  Charges:    $Therapeutic Exercise: 8-22 mins $Therapeutic Activity: 8-22 mins PT General Charges $$ ACUTE PT VISIT: 1 Visit                     Tori Chesni Vos PT, DPT 02/24/23, 10:51 AM

## 2023-02-24 NOTE — Discharge Summary (Addendum)
Physician Discharge Summary   Patient: Steven [name] MRN: [id number] DOB: [birth year]  Admit date:     02/19/2023  Discharge date: 02/24/23  Discharge Physician: Marinda Elk   PCP: [name], MD   Recommendations at discharge:    Patient is full code Patient to consume a regular diet Patient is to continue to wear hip abdominal brace as able to stabilize right hip. Patient may get out of bed with assistance or using an assistive device with brace in place.  Please be mindful that patient should actively have right posterior hip precautions at all times. Patient additionally suffers from orthostatic hypotension and should be wearing thigh-high compression stockings with ambulation at all times. Please maintain all outpatient follow-up appointments including follow-up facility provider per protocol, outpatient primary care provider and Dr. Eulah Pont with orthopedic surgery upon already scheduled follow-up appointment. Please return patient to the emergency department if patient develops worsening confusion, weakness or fevers greater than 100.4 F.  Discharge Diagnoses:   Assessment & Plan Fall at home, initial encounter  Closed traumatic minimally displaced fracture of one rib of left side  Orthostatic hypotension  Closed dislocation of right hip (HCC)  Ankylosing spondylitis (HCC)  GERD without esophagitis  Major depressive disorder  Mixed hyperlipidemia      Hospital Course: 88 year old male with past medical history of ankylosing spondylitis, prior stroke on antiplatelet therapy, gastroesophageal reflux disease, hyperlipidemia, right hip arthroplasty with recent complications of 2 recent episodes of spontaneous right hip dislocations who presented to [facility] emergency department after a fall.  Workup revealed that patient's frequent falls were likely precipitated by concurrent orthostatic hypotension with repeated bouts of reproducible positive  orthostatic vital signs.  Compression stockings were ordered which the patient is to continue to use to minimize the degree of orthostasis with attempts at ambulation.  Patient was value by physical therapy with the right posterior hip restriction and it was felt the patient would benefit from skilled therapy services in a skilled nursing facility.    Patient was additionally identified to have a slightly displaced left rib fracture which was managed conservatively with pain gradually improving with mild analgesics.  Patient was eventually discharged to skilled nursing facility in improved and stable condition on 02/24/2023.  In preparation for discharge, goals of care were discussed with patient and family who agreed for a for care to consult and follow-up postdischarge for palliative care services.    Consultants: None Procedures performed: None  Disposition: Skilled nursing facility Diet recommendation:  Discharge Diet Orders (From admission, onward)     Start     Ordered   02/24/23 0000  Diet - low sodium heart healthy        02/24/23 1429           Cardiac diet  DISCHARGE MEDICATION: Allergies as of 02/24/2023       Reactions   Indomethacin Hives   Lactose Intolerance (gi) Nausea And Vomiting, Other (See Comments)   GI UPSET        Medication List     STOP taking these medications    ferrous sulfate 325 (65 FE) MG tablet       TAKE these medications    acetaminophen 325 MG tablet Commonly known as: TYLENOL Take 325-650 mg by mouth every 6 (six) hours as needed for mild pain (pain score 1-3) or headache.   aspirin EC 325 MG tablet Take 1 tablet (325 mg total) by mouth daily.   Biotin 5000 MCG  Tabs Take 5,000 mcg by mouth daily.   buPROPion 300 MG 24 hr tablet Commonly known as: WELLBUTRIN XL Take 300 mg by mouth at bedtime.   Calcium Citrate 250 MG Tabs Take 250 mg by mouth daily.   CALCIUM+D3 PO Take 2 tablets by mouth 2 (two) times daily with a  meal.   clopidogrel 75 MG tablet Commonly known as: PLAVIX Take 1 tablet (75 mg total) by mouth daily. What changed: when to take this   cyanocobalamin 1000 MCG tablet Commonly known as: VITAMIN B12 Take 1,000 mcg by mouth daily.   fluoruracil 0.5 % cream Commonly known as: CARAC Apply 1 application  topically daily as needed (for cancer treatment- as directed to affected areas).   folic acid 1 MG tablet Commonly known as: FOLVITE Take 1 mg by mouth in the morning, at noon, and at bedtime.   Gemtesa 75 MG Tabs Generic drug: Vibegron Take 75 mg by mouth at bedtime.   ipratropium 0.06 % nasal spray Commonly known as: ATROVENT Place 2 sprays into both nostrils 2 (two) times daily as needed (allergies).   methotrexate 2.5 MG tablet Commonly known as: RHEUMATREX Take 20 mg by mouth every Sunday.   oxyCODONE 5 MG immediate release tablet Commonly known as: Roxicodone Take 1 tablet (5 mg total) by mouth every 6 (six) hours as needed for severe pain (pain score 7-10).   pantoprazole 40 MG tablet Commonly known as: PROTONIX Take 1 tablet (40 mg total) by mouth daily. What changed: when to take this   PRESERVISION AREDS 2 PO Take 1 capsule by mouth in the morning and at bedtime.   rosuvastatin 20 MG tablet Commonly known as: CRESTOR Take 1 tablet (20 mg total) by mouth at bedtime.   Soothe XP Soln Place 1 drop into both eyes 2 (two) times daily as needed (for dryness).   Vitamin D3 1000 units Caps Take 2,000 Units by mouth daily.        Contact information for follow-up providers     [name], MD Follow up in 1 week(s).   Specialty: Internal Medicine Contact information: [address] [place] [address] [place] [address] [phone]              Contact information for after-discharge care     Destination     [facility] Preferred SNF .   Service: Skilled Nursing Contact information: [address] [place] [address] [phone]                     Discharge Exam: Steven [name] Weights   02/19/23 1809  Weight: 56.7 kg    Constitutional: Awake alert and oriented x3, no associated distress.   Respiratory: clear to auscultation bilaterally, no wheezing, no crackles. Normal respiratory effort. No accessory muscle use.  Cardiovascular: Regular rate and rhythm, no murmurs / rubs / gallops. No extremity edema. 2+ pedal pulses. No carotid bruits.  Abdomen: Abdomen is soft and nontender.  No evidence of intra-abdominal masses.  Positive bowel sounds noted in all quadrants.   Musculoskeletal: Right hip immobilizer in place.    Condition at discharge: fair  The results of significant diagnostics from this hospitalization (including imaging, microbiology, ancillary and laboratory) are listed below for reference.   Imaging Studies: CT Head Wo Contrast  Result Date: 02/19/2023 CLINICAL DATA:  Fall from bed, head injury EXAM: CT HEAD WITHOUT CONTRAST CT CERVICAL SPINE WITHOUT CONTRAST TECHNIQUE: Multidetector CT imaging of the head  and cervical spine was performed following the standard protocol without intravenous contrast. Multiplanar CT image reconstructions of the cervical spine were also generated. RADIATION DOSE REDUCTION: This exam was performed according to the departmental dose-optimization program which includes automated exposure control, adjustment of the mA and/or kV according to patient size and/or use of iterative reconstruction technique. COMPARISON:  02/14/2023 FINDINGS: CT HEAD FINDINGS Brain: No evidence of acute infarction, hemorrhage, hydrocephalus, extra-axial collection or mass lesion/mass effect. Vascular: No hyperdense vessel or unexpected calcification. Skull: Normal. Negative for fracture or focal lesion. Sinuses/Orbits: No acute finding. Other: None. CT CERVICAL SPINE FINDINGS Alignment: Normal. Skull base and vertebrae: No acute fracture. No primary  bone lesion or focal pathologic process. Soft tissues and spinal canal: No prevertebral fluid or swelling. No visible canal hematoma. Disc levels: Complete bony ankylosis of the skull base, cervical spine, and included upper thoracic spine. Upper chest: Negative. Other: None. IMPRESSION: 1. No acute intracranial pathology. 2. No fracture or static subluxation of the cervical spine. 3. Complete bony ankylosis of the skull base, cervical spine, and included upper thoracic spine. Findings are consistent with advanced ankylosing spondylosis. Electronically Signed   By: Jearld Lesch M.D.   On: 02/19/2023 15:38   CT Cervical Spine Wo Contrast  Result Date: 02/19/2023 CLINICAL DATA:  Fall from bed, head injury EXAM: CT HEAD WITHOUT CONTRAST CT CERVICAL SPINE WITHOUT CONTRAST TECHNIQUE: Multidetector CT imaging of the head and cervical spine was performed following the standard protocol without intravenous contrast. Multiplanar CT image reconstructions of the cervical spine were also generated. RADIATION DOSE REDUCTION: This exam was performed according to the departmental dose-optimization program which includes automated exposure control, adjustment of the mA and/or kV according to patient size and/or use of iterative reconstruction technique. COMPARISON:  02/14/2023 FINDINGS: CT HEAD FINDINGS Brain: No evidence of acute infarction, hemorrhage, hydrocephalus, extra-axial collection or mass lesion/mass effect. Vascular: No hyperdense vessel or unexpected calcification. Skull: Normal. Negative for fracture or focal lesion. Sinuses/Orbits: No acute finding. Other: None. CT CERVICAL SPINE FINDINGS Alignment: Normal. Skull base and vertebrae: No acute fracture. No primary bone lesion or focal pathologic process. Soft tissues and spinal canal: No prevertebral fluid or swelling. No visible canal hematoma. Disc levels: Complete bony ankylosis of the skull base, cervical spine, and included upper thoracic spine. Upper chest:  Negative. Other: None. IMPRESSION: 1. No acute intracranial pathology. 2. No fracture or static subluxation of the cervical spine. 3. Complete bony ankylosis of the skull base, cervical spine, and included upper thoracic spine. Findings are consistent with advanced ankylosing spondylosis. Electronically Signed   By: Jearld Lesch M.D.   On: 02/19/2023 15:38   DG Ribs Unilateral W/Chest Left  Result Date: 02/19/2023 CLINICAL DATA:  Left rib pain after fall. EXAM: LEFT RIBS AND CHEST - 3+ VIEW COMPARISON:  February 04, 2023. FINDINGS: Minimally displaced fractures involving the lateral portions of the left eighth and ninth ribs. There is no evidence of pneumothorax or pleural effusion. Both lungs are clear. Heart size and mediastinal contours are within normal limits. IMPRESSION: Minimally displaced left eighth and ninth rib fractures. Electronically Signed   By: Lupita Raider M.D.   On: 02/19/2023 15:31   DG Hip [place] W or Wo Pelvis 1 View Right  Result Date: 02/19/2023 CLINICAL DATA:  Right hip pain after fall. EXAM: DG HIP (WITH OR WITHOUT PELVIS) 1V PORT RIGHT COMPARISON:  February 17, 2023. FINDINGS: Status post right total hip arthroplasty. No fracture or dislocation is noted. IMPRESSION: No  acute abnormality seen. Electronically Signed   By: Lupita Raider M.D.   On: 02/19/2023 15:28   CT HIP RIGHT WO CONTRAST  Result Date: 02/17/2023 CLINICAL DATA:  Right hip arthroplasty dislocation EXAM: CT OF THE RIGHT HIP WITHOUT CONTRAST TECHNIQUE: Multidetector CT imaging of the right hip was performed according to the standard protocol. Multiplanar CT image reconstructions were also generated. RADIATION DOSE REDUCTION: This exam was performed according to the departmental dose-optimization program which includes automated exposure control, adjustment of the mA and/or kV according to patient size and/or use of iterative reconstruction technique. COMPARISON:  02/13/2023, 02/16/2023 FINDINGS:  Bones/Joint/Cartilage Postsurgical changes from right total hip arthroplasty. Arthroplasty components are in their expected alignment. No dislocation. No periprosthetic lucency or fracture. No lytic or sclerotic bone lesion. The right sacroiliac joint is ankylosed. Ligaments Suboptimally assessed by CT. Muscles and Tendons Subcentimeter hyperattenuating focus within the superficial aspect of the right gluteus medius muscle, likely a small site of intramuscular hemorrhage (series 5, image 21). No organized hematoma. Otherwise, no acute abnormality. Soft tissues No periprosthetic fluid collection. No right inguinal lymphadenopathy. Atherosclerotic calcification. IMPRESSION: 1. Postsurgical changes from right total hip arthroplasty. Normal alignment. No evidence of periprosthetic fracture or other hardware complication. 2. Subcentimeter hyperattenuating focus within the right gluteus medius muscle, likely a small site of intramuscular hemorrhage. No organized intramuscular hematoma. Electronically Signed   By: Duanne Guess D.O.   On: 02/17/2023 13:07   DG Pelvis Portable  Result Date: 02/16/2023 CLINICAL DATA:  Reduction of right hip dislocation EXAM: PORTABLE PELVIS 1-2 VIEWS COMPARISON:  02/16/2023 at 6:36 p.m. FINDINGS: Frontal view of the pelvis was obtained at 8:14 p.m. There is anatomic alignment of the right hip arthroplasty after reduction. No evidence of acute fracture. Stable left hip osteoarthritis. IMPRESSION: 1. Reduction of prior right hip arthroplasty dislocation, with anatomic alignment on this exam. No acute fracture. Electronically Signed   By: Sharlet Salina M.D.   On: 02/16/2023 20:57   DG Femur 1 View Right  Result Date: 02/16/2023 CLINICAL DATA:  History of right hip dislocation EXAM: RIGHT FEMUR 1 VIEW COMPARISON:  02/13/2023 FINDINGS: Cross-table lateral views of the right femur are obtained. Evaluation of the right hip is limited due to overlapping structures. Right knee  osteoarthritis is noted. The soft tissues are normal. IMPRESSION: 1. Nondiagnostic cross-table lateral view of the right hip due to overlying structures. Please see corresponding pelvic x-ray. 2. Right knee osteoarthritis. Electronically Signed   By: Sharlet Salina M.D.   On: 02/16/2023 20:56   DG Pelvis Portable  Result Date: 02/16/2023 CLINICAL DATA:  Clinical concern for dislocation or fracture. EXAM: PORTABLE PELVIS 1-2 VIEWS COMPARISON:  02/14/2023. FINDINGS: Total arthroplasty changes are noted on the right. There is superior dislocation of the right femoral head component. No evidence of acute fracture or hardware loosening is seen. Moderate degenerative changes are noted at the left hip. There are degenerative changes in the lower lumbar spine with findings suggestive of ankylosing spondylitis. IMPRESSION: Status post total hip arthroplasty on the right with superior dislocation of the femoral head component. Electronically Signed   By: Thornell Sartorius M.D.   On: 02/16/2023 20:55   DG Pelvis 1-2 Views  Result Date: 02/14/2023 CLINICAL DATA:  Presented with right hip arthroplasty dislocation, evaluate postreduction. EXAM: PELVIS - 1-2 VIEW COMPARISON:  Earlier study 02/13/23 at 11:52 p.m. FINDINGS: Single AP view demonstrates interval relocation of the right lower extremity femoral arthroplasty to the acetabular cup prosthesis. Osteopenia and degenerative change  again noted. Extensive bridging syndesmophytes lower lumbar spine with partial ankylosis over the SI joints. IMPRESSION: Interval relocation of the right lower extremity femoral arthroplasty to the acetabular cup prosthesis. Electronically Signed   By: Almira Bar M.D.   On: 02/14/2023 03:13   CT Head Wo Contrast  Result Date: 02/14/2023 CLINICAL DATA:  Head trauma, minor (Age >= 65y); Neck trauma (Age >= 65y), fall EXAM: CT HEAD WITHOUT CONTRAST CT CERVICAL SPINE WITHOUT CONTRAST TECHNIQUE: Multidetector CT imaging of the head and  cervical spine was performed following the standard protocol without intravenous contrast. Multiplanar CT image reconstructions of the cervical spine were also generated. RADIATION DOSE REDUCTION: This exam was performed according to the departmental dose-optimization program which includes automated exposure control, adjustment of the mA and/or kV according to patient size and/or use of iterative reconstruction technique. COMPARISON:  None Available. FINDINGS: CT HEAD FINDINGS Brain: No evidence of acute infarction, hemorrhage, hydrocephalus, extra-axial collection or mass lesion/mass effect. Mild to moderate global parenchymal volume loss is stable since prior examination and commensurate with the patient's age. Vascular: No hyperdense vessel or unexpected calcification. Skull: Normal. Negative for fracture or focal lesion. Sinuses/Orbits: No acute finding. Other: Mastoid air cells and middle ear cavities are clear. CT CERVICAL SPINE FINDINGS Alignment: There is straightening of the cervical spine. No listhesis. Skull base and vertebrae: There is ankylosis of the craniocervical articulations, atlantoaxial articulations, and vertebral bodies and facet joints throughout the visualized thoracolumbar spine in keeping with changes of ankylosing spondylitis. No acute fracture of the cervical spine. Vertebral body height is preserved. Soft tissues and spinal canal: No prevertebral fluid or swelling. No visible canal hematoma. Disc levels: Intervertebral disc heights are preserved though, as noted above, there is solid ankylosis of the vertebral column related to changes of ankylosing spondylitis. No high-grade canal stenosis. No significant neuroforaminal narrowing. The prevertebral soft tissues are not thickened on sagittal reformats. Upper chest: Negative. Other: Left carotid stenting has been performed with narrowing of the stent in the area of the carotid bulb with resultant stenosis approaching at least 50%, not  well characterized on this noncontrast examination. IMPRESSION: 1. No acute intracranial abnormality. No calvarial fracture. 2. No acute fracture or listhesis of the cervical spine. 3. Changes of ankylosing spondylitis. 4. Left carotid stenting has been performed with narrowing of the stent in the area of the carotid bulb with resultant stenosis approaching at least 50%, not well characterized on this noncontrast examination. This could be better assessed with dedicated carotid Doppler ultrasound. Electronically Signed   By: Helyn Numbers M.D.   On: 02/14/2023 00:44   CT Cervical Spine Wo Contrast  Result Date: 02/14/2023 CLINICAL DATA:  Head trauma, minor (Age >= 65y); Neck trauma (Age >= 65y), fall EXAM: CT HEAD WITHOUT CONTRAST CT CERVICAL SPINE WITHOUT CONTRAST TECHNIQUE: Multidetector CT imaging of the head and cervical spine was performed following the standard protocol without intravenous contrast. Multiplanar CT image reconstructions of the cervical spine were also generated. RADIATION DOSE REDUCTION: This exam was performed according to the departmental dose-optimization program which includes automated exposure control, adjustment of the mA and/or kV according to patient size and/or use of iterative reconstruction technique. COMPARISON:  None Available. FINDINGS: CT HEAD FINDINGS Brain: No evidence of acute infarction, hemorrhage, hydrocephalus, extra-axial collection or mass lesion/mass effect. Mild to moderate global parenchymal volume loss is stable since prior examination and commensurate with the patient's age. Vascular: No hyperdense vessel or unexpected calcification. Skull: Normal. Negative for fracture or focal lesion.  Sinuses/Orbits: No acute finding. Other: Mastoid air cells and middle ear cavities are clear. CT CERVICAL SPINE FINDINGS Alignment: There is straightening of the cervical spine. No listhesis. Skull base and vertebrae: There is ankylosis of the craniocervical articulations,  atlantoaxial articulations, and vertebral bodies and facet joints throughout the visualized thoracolumbar spine in keeping with changes of ankylosing spondylitis. No acute fracture of the cervical spine. Vertebral body height is preserved. Soft tissues and spinal canal: No prevertebral fluid or swelling. No visible canal hematoma. Disc levels: Intervertebral disc heights are preserved though, as noted above, there is solid ankylosis of the vertebral column related to changes of ankylosing spondylitis. No high-grade canal stenosis. No significant neuroforaminal narrowing. The prevertebral soft tissues are not thickened on sagittal reformats. Upper chest: Negative. Other: Left carotid stenting has been performed with narrowing of the stent in the area of the carotid bulb with resultant stenosis approaching at least 50%, not well characterized on this noncontrast examination. IMPRESSION: 1. No acute intracranial abnormality. No calvarial fracture. 2. No acute fracture or listhesis of the cervical spine. 3. Changes of ankylosing spondylitis. 4. Left carotid stenting has been performed with narrowing of the stent in the area of the carotid bulb with resultant stenosis approaching at least 50%, not well characterized on this noncontrast examination. This could be better assessed with dedicated carotid Doppler ultrasound. Electronically Signed   By: Helyn Numbers M.D.   On: 02/14/2023 00:44   DG HIP UNILAT WITH PELVIS 2-3 VIEWS RIGHT  Result Date: 02/14/2023 CLINICAL DATA:  Fall with right hip prosthesis dislocation. [id number] with right hip pain. EXAM: DG HIP (WITH OR WITHOUT PELVIS) 2-3V RIGHT COMPARISON:  Study of 06/11/2020 FINDINGS: There is right hip total joint arthroplasty with cephalad dislocated femoral component. No findings suspicious for fractures or hardware loosening. There is osteopenia. Moderate joint space loss left hip. No pelvic fracture or diastasis. At least partial ankylosis again is shown about  the superior SI joints. Pubic symphysis is patent. Scrotal surgical clips suggesting prior vasectomy. Soft tissues are otherwise unremarkable. IMPRESSION: 1. Right hip total joint arthroplasty with cephalad dislocated femoral component. 2. Osteopenia. 3. No findings suspicious for fractures or hardware loosening. 4. Moderate joint space loss left hip. 5. At least partial ankylosis about the superior SI joints. Electronically Signed   By: Almira Bar M.D.   On: 02/14/2023 00:12   DG Chest 2 View  Result Date: 02/04/2023 CLINICAL DATA:  Cough and fever.  Congestion. EXAM: CHEST - 2 VIEW COMPARISON:  Radiograph 07/11/2022, CT 08/14/2023 FINDINGS: Patchy airspace disease in the right mid lung suspicious for pneumonia. The heart is normal in size. Aortic atherosclerosis. No pleural effusion, pulmonary edema, or pneumothorax. No evidence of acute osseous abnormality. IMPRESSION: Patchy airspace disease in the right mid lung suspicious for pneumonia. Recommend radiographic follow-up to resolution. Electronically Signed   By: Narda Rutherford M.D.   On: 02/04/2023 16:41   MR BRAIN WO CONTRAST  Result Date: 01/27/2023 Table formatting from the original result was not included. [facility] NEUROIM[facility] REPORT STUDY DATE: 01/26/23 PATIENT NAME: Steven [name] DOB: 01-Dec-1934 MRN: [id number] ORDERING CLINICIAN: Windell Norfolk, MD CLINICAL HISTORY: 88 y.o. year old male with: 1. Dizziness   EXAM: MR BRAIN WO CONTRAST TECHNIQUE: MRI of the brain with and without contrast was obtained utilizing multiplanar, multiecho pulse sequences. CONTRAST: Diagnostic Product Medications (last 72 hours)   None   COMPARISON: 03/28/21 MRI IMAGING SITE: [facility] [address] [place] [address] FINDINGS: No abnormal lesions are seen on diffusion-weighted views to suggest acute ischemia. The cortical sulci, fissures and cisterns are mild biparietal and perisylvian  atrophy.  Lateral, third and fourth ventricle are normal in size and appearance. No extra-axial fluid collections are seen. No evidence of mass effect or midline shift.  Mild periventricular and subcortical chronic small vessel ischemic disease.  No abnormal lesions are seen on post contrast views.  On sagittal views the posterior fossa, pituitary gland and corpus callosum are unremarkable. No evidence of intracranial hemorrhage on SWI views. The orbits and their contents, paranasal sinuses and calvarium are notable for bilateral lens extractions. Intracranial flow voids are present.   MRI brain (without) demonstrating: -Mild atrophy and mild chronic small vessel ischemic disease. -No acute findings. INTERPRETING PHYSICIAN: Suanne Marker, MD Certified in Neurology, Neurophysiology and Neuroimaging [facility] [address] [place], [place] [address] [phone]   Microbiology: Results for orders placed or performed during the hospital encounter of 02/04/23  Blood culture (routine x 2)     Status: None   Collection Time: 02/04/23  1:50 PM   Specimen: BLOOD  Result Value Ref Range Status   Specimen Description   Final    BLOOD SITE NOT SPECIFIED Performed at [facility], [address] [place], [place] [address]    Special Requests   Final    BOTTLES DRAWN AEROBIC AND ANAEROBIC Blood Culture results may not be optimal due to an excessive volume of blood received in culture bottles Performed at [facility], [address] [place], [place] [address]    Culture   Final    NO GROWTH 6 DAYS Performed at [facility], [address] [place], [place] [address]    Report Status 02/10/2023 FINAL  Final  Blood culture (routine x 2)     Status: None   Collection Time: 02/04/23  1:50 PM   Specimen: BLOOD  Result Value Ref Range Status   Specimen Description   Final    BLOOD SITE NOT SPECIFIED Performed at [facility], [address], [place], [place] [address]    Special Requests   Final    BOTTLES DRAWN AEROBIC AND ANAEROBIC Blood Culture adequate volume Performed at [facility], [address] [place], [place] [address]    Culture   Final    NO GROWTH 6 DAYS Performed at [facility], [address], [place], [place] [address]    Report Status 02/10/2023 FINAL  Final  Resp panel by RT-PCR (RSV, Flu A&B, Covid) Anterior Nasal Swab     Status: None   Collection Time: 02/04/23  1:52 PM   Specimen: Anterior Nasal Swab  Result Value Ref Range Status   SARS Coronavirus 2 by RT PCR NEGATIVE NEGATIVE Final    Comment: (NOTE) SARS-CoV-2 target nucleic acids are NOT DETECTED.  The SARS-CoV-2 RNA is generally detectable in upper respiratory specimens during the acute phase of infection. The lowest concentration of SARS-CoV-2 viral copies this assay can detect is 138 copies/mL. A negative result does not preclude SARS-Cov-2 infection and should not be used as the sole basis for treatment or other patient management decisions. A negative result may occur with  improper specimen collection/handling, submission of specimen other than nasopharyngeal swab, presence of viral mutation(s) within the areas targeted by this assay, and inadequate number of viral copies(<138 copies/mL). A negative result must be combined  with clinical observations, patient history, and epidemiological information. The expected result is Negative.  Fact Sheet for Patients:  BloggerCourse.com  Fact Sheet for Healthcare Providers:  SeriousBroker.it  This test is no t yet approved or cleared by the [place] FDA and  has been authorized for detection and/or diagnosis of SARS-CoV-2 by FDA under an Emergency Use Authorization (EUA). This EUA will remain  in effect (meaning this test can be used) for the duration of the COVID-19  declaration under Section 564(b)(1) of the Act, 21 U.S.C.section 360bbb-3(b)(1), unless the authorization is terminated  or revoked sooner.       Influenza A by PCR NEGATIVE NEGATIVE Final   Influenza B by PCR NEGATIVE NEGATIVE Final    Comment: (NOTE) The Xpert Xpress SARS-CoV-2/FLU/RSV plus assay is intended as an aid in the diagnosis of influenza from Nasopharyngeal swab specimens and should not be used as a sole basis for treatment. Nasal washings and aspirates are unacceptable for Xpert Xpress SARS-CoV-2/FLU/RSV testing.  Fact Sheet for Patients: BloggerCourse.com  Fact Sheet for Healthcare Providers: SeriousBroker.it  This test is not yet approved or cleared by the [place] FDA and has been authorized for detection and/or diagnosis of SARS-CoV-2 by FDA under an Emergency Use Authorization (EUA). This EUA will remain in effect (meaning this test can be used) for the duration of the COVID-19 declaration under Section 564(b)(1) of the Act, 21 U.S.C. section 360bbb-3(b)(1), unless the authorization is terminated or revoked.     Resp Syncytial Virus by PCR NEGATIVE NEGATIVE Final    Comment: (NOTE) Fact Sheet for Patients: BloggerCourse.com  Fact Sheet for Healthcare Providers: SeriousBroker.it  This test is not yet approved or cleared by the [place] FDA and has been authorized for detection and/or diagnosis of SARS-CoV-2 by FDA under an Emergency Use Authorization (EUA). This EUA will remain in effect (meaning this test can be used) for the duration of the COVID-19 declaration under Section 564(b)(1) of the Act, 21 U.S.C. section 360bbb-3(b)(1), unless the authorization is terminated or revoked.  Performed at [facility], [address] [place], [place] [address]   Respiratory (~20 pathogens) panel by PCR     Status: None   Collection  Time: 02/05/23  9:51 AM   Specimen: Nasopharyngeal Swab; Respiratory  Result Value Ref Range Status   Adenovirus NOT DETECTED NOT DETECTED Final   Coronavirus 229E NOT DETECTED NOT DETECTED Final    Comment: (NOTE) The Coronavirus on the Respiratory Panel, DOES NOT test for the novel  Coronavirus (2019 nCoV)    Coronavirus HKU1 NOT DETECTED NOT DETECTED Final   Coronavirus NL63 NOT DETECTED NOT DETECTED Final   Coronavirus OC43 NOT DETECTED NOT DETECTED Final   Metapneumovirus NOT DETECTED NOT DETECTED Final   Rhinovirus / Enterovirus NOT DETECTED NOT DETECTED Final   Influenza A NOT DETECTED NOT DETECTED Final   Influenza B NOT DETECTED NOT DETECTED Final   Parainfluenza Virus 1 NOT DETECTED NOT DETECTED Final   Parainfluenza Virus 2 NOT DETECTED NOT DETECTED Final   Parainfluenza Virus 3 NOT DETECTED NOT DETECTED Final   Parainfluenza Virus 4 NOT DETECTED NOT DETECTED Final   Respiratory Syncytial Virus NOT DETECTED NOT DETECTED Final   Bordetella pertussis NOT DETECTED NOT DETECTED Final   Bordetella Parapertussis NOT DETECTED NOT DETECTED Final   Chlamydophila pneumoniae NOT DETECTED NOT DETECTED Final   Mycoplasma pneumoniae NOT DETECTED NOT DETECTED Final    Comment: Performed at [facility], [address], [place],  [place] [address]  Expectorated Sputum Assessment w Gram Stain, Rflx to Resp Cult     Status: None   Collection Time: 02/05/23  9:51 AM   Specimen: Expectorated Sputum  Result Value Ref Range Status   Specimen Description EXPECTORATED SPUTUM  Final   Special Requests NONE  Final   Sputum evaluation   Final    Sputum specimen not acceptable for testing.  Please recollect.   INFORMED RN @1212  ON 10.25.2024 BY [facility] Performed at [facility], [address] [place], [place] [address]    Report Status 02/05/2023 FINAL  Final    Labs: CBC: Recent Labs  Lab 02/19/23 1509 02/20/23 0715 02/21/23 0648  WBC 10.3 8.7 8.8  NEUTROABS  8.5*  --  6.6  HGB 9.2* 10.3* 10.0*  HCT 27.8* 31.6* 30.9*  MCV 100.4* 99.7 100.3*  PLT 372 416* 363   Basic Metabolic Panel: Recent Labs  Lab 02/19/23 1509 02/20/23 0715 02/21/23 0648 02/23/23 0846  NA 136 134* 132* 132*  K 4.3 4.6 3.6 4.1  CL 102 99 100 98  CO2 26 28 25 26   GLUCOSE 154* 123* 132* 129*  BUN 18 15 16 14   CREATININE 0.72 0.82 0.72 0.78  CALCIUM 8.4* 8.7* 8.2* 8.4*   Liver Function Tests: No results for input(s): "AST", "ALT", "ALKPHOS", "BILITOT", "PROT", "ALBUMIN" in the last 168 hours. CBG: No results for input(s): "GLUCAP" in the last 168 hours.  Discharge time spent: greater than 30 minutes.  Signed: Marinda Elk, MD [facility] 02/24/2023

## 2023-02-24 NOTE — Progress Notes (Signed)
Telephone call to [facility]. Report given to receiving nurse Judeth Cornfield.

## 2023-02-24 NOTE — Plan of Care (Signed)
  Problem: Education: Goal: Knowledge of General Education information will improve Description: Including pain rating scale, medication(s)/side effects and non-pharmacologic comfort measures Outcome: Not Progressing   Problem: Activity: Goal: Risk for activity intolerance will decrease Outcome: Progressing   Problem: Nutrition: Goal: Adequate nutrition will be maintained Outcome: Progressing   Problem: Coping: Goal: Level of anxiety will decrease Outcome: Progressing   Problem: Pain Management: Goal: General experience of comfort will improve Outcome: Progressing

## 2023-02-24 NOTE — Plan of Care (Signed)

## 2023-02-24 NOTE — Discharge Instructions (Addendum)
Patient is full code Patient to consume a regular diet Patient is to continue to wear hip abdominal brace as able to stabilize right hip. Patient may get out of bed with assistance or using an assistive device with brace in place.  Please be mindful that patient should actively have right posterior hip precautions at all times. Patient additionally suffers from orthostatic hypotension and should be wearing thigh-high compression stockings with ambulation at all times. Please maintain all outpatient follow-up appointments including follow-up facility provider per protocol, outpatient primary care provider and Dr. Eulah Pont with orthopedic surgery upon already scheduled follow-up appointment. Please return patient to the emergency department if patient develops worsening confusion, weakness or fevers greater than 100.4 F.

## 2023-02-24 NOTE — TOC Transition Note (Signed)
Transition of Care [facility]) - CM/SW Discharge Note   Patient Details  Name: Steven [name] MRN: [id number] Date of Birth: 06-16-1934  Transition of Care [facility]) CM/SW Contact:  Otelia Santee, LCSW Phone Number: 02/24/2023, 3:24 PM   Clinical Narrative:    Pt discharging to [facility] and will be going to room 1202P. RN to call report to [phone]. DC packet placed at RN station. [facility] called for transportation.    Final next level of care: Home w Home Health Services Barriers to Discharge: Family Issues   Patient Goals and CMS Choice CMS Medicare.gov Compare Post Acute Care list provided to:: Patient Choice offered to / list presented to : Patient, Spouse, Adult Children  Discharge Placement                Patient chooses bed at: [facility] Patient to be transferred to facility by: PT[facility] Name of family member notified: Patient Patient and family notified of of transfer: 02/24/23  Discharge Plan and Services Additional resources added to the After Visit Summary for                            [facility] Arranged: PT [facility] Agency: [facility] Date [facility] Agency Contacted: 02/23/23 Time HH Agency Contacted: 1241 Representative spoke with at [facility] Agency: Cindie  Social Determinants of Health (SDOH) Interventions SDOH Screenings   Food Insecurity: No Food Insecurity (02/19/2023)  Housing: Low Risk  (02/19/2023)  Transportation Needs: No Transportation Needs (02/19/2023)  Utilities: Not At Risk (02/19/2023)  Financial Resource Strain: Low Risk  (03/09/2019)  Physical Activity: Insufficiently Active (03/09/2019)  Social Connections: Unknown (03/09/2019)  Stress: No Stress Concern Present (03/09/2019)  Tobacco Use: Medium Risk (02/19/2023)     Readmission Risk Interventions    02/23/2023   12:29 PM  Readmission Risk Prevention Plan  Transportation Screening Complete  PCP or Specialist Appt within 3-5 Days Complete  HRI or Home Care Consult Complete  Social  Work Consult for Recovery Care Planning/Counseling Complete  Palliative Care Screening Not Applicable  Medication Review [employer]) Complete

## 2023-02-24 NOTE — Hospital Course (Addendum)
88 year old male with past medical history of ankylosing spondylitis, prior stroke on antiplatelet therapy, gastroesophageal reflux disease, hyperlipidemia, right hip arthroplasty with recent complications of 2 recent episodes of spontaneous right hip dislocations who presented to [facility] emergency department after a fall.  Workup revealed that patient's frequent falls were likely precipitated by concurrent orthostatic hypotension with repeated bouts of reproducible positive orthostatic vital signs.  Compression stockings were ordered which the patient is to continue to use to minimize the degree of orthostasis with attempts at ambulation.  Patient was value by physical therapy with the right posterior hip restriction and it was felt the patient would benefit from skilled therapy services in a skilled nursing facility.    Patient was additionally identified to have a slightly displaced left rib fracture which was managed conservatively with pain gradually improving with mild analgesics.  Patient was eventually discharged to skilled nursing facility in improved and stable condition on 02/24/2023.  In preparation for discharge, goals of care were discussed with patient and family who agreed for a for care to consult and follow-up postdischarge for palliative care services.

## 2023-02-25 DIAGNOSIS — K219 Gastro-esophageal reflux disease without esophagitis: Secondary | ICD-10-CM | POA: Diagnosis not present

## 2023-02-25 DIAGNOSIS — S73004D Unspecified dislocation of right hip, subsequent encounter: Secondary | ICD-10-CM | POA: Diagnosis not present

## 2023-02-25 DIAGNOSIS — Z8673 Personal history of transient ischemic attack (TIA), and cerebral infarction without residual deficits: Secondary | ICD-10-CM | POA: Diagnosis not present

## 2023-02-25 DIAGNOSIS — M459 Ankylosing spondylitis of unspecified sites in spine: Secondary | ICD-10-CM | POA: Diagnosis not present

## 2023-02-25 DIAGNOSIS — R531 Weakness: Secondary | ICD-10-CM | POA: Diagnosis not present

## 2023-02-25 DIAGNOSIS — I951 Orthostatic hypotension: Secondary | ICD-10-CM | POA: Diagnosis not present

## 2023-02-26 DIAGNOSIS — M6281 Muscle weakness (generalized): Secondary | ICD-10-CM | POA: Diagnosis not present

## 2023-02-26 DIAGNOSIS — R278 Other lack of coordination: Secondary | ICD-10-CM | POA: Diagnosis not present

## 2023-02-26 DIAGNOSIS — M24451 Recurrent dislocation, right hip: Secondary | ICD-10-CM | POA: Diagnosis not present

## 2023-02-26 DIAGNOSIS — Z9181 History of falling: Secondary | ICD-10-CM | POA: Diagnosis not present

## 2023-03-01 DIAGNOSIS — I951 Orthostatic hypotension: Secondary | ICD-10-CM | POA: Diagnosis not present

## 2023-03-01 DIAGNOSIS — S73004D Unspecified dislocation of right hip, subsequent encounter: Secondary | ICD-10-CM | POA: Diagnosis not present

## 2023-03-01 DIAGNOSIS — R531 Weakness: Secondary | ICD-10-CM | POA: Diagnosis not present

## 2023-03-01 DIAGNOSIS — K219 Gastro-esophageal reflux disease without esophagitis: Secondary | ICD-10-CM | POA: Diagnosis not present

## 2023-03-01 DIAGNOSIS — M459 Ankylosing spondylitis of unspecified sites in spine: Secondary | ICD-10-CM | POA: Diagnosis not present

## 2023-03-01 DIAGNOSIS — Z8673 Personal history of transient ischemic attack (TIA), and cerebral infarction without residual deficits: Secondary | ICD-10-CM | POA: Diagnosis not present

## 2023-03-02 DIAGNOSIS — M459 Ankylosing spondylitis of unspecified sites in spine: Secondary | ICD-10-CM | POA: Diagnosis not present

## 2023-03-02 DIAGNOSIS — K219 Gastro-esophageal reflux disease without esophagitis: Secondary | ICD-10-CM | POA: Diagnosis not present

## 2023-03-02 DIAGNOSIS — S73004D Unspecified dislocation of right hip, subsequent encounter: Secondary | ICD-10-CM | POA: Diagnosis not present

## 2023-03-02 DIAGNOSIS — R278 Other lack of coordination: Secondary | ICD-10-CM | POA: Diagnosis not present

## 2023-03-02 DIAGNOSIS — Z8673 Personal history of transient ischemic attack (TIA), and cerebral infarction without residual deficits: Secondary | ICD-10-CM | POA: Diagnosis not present

## 2023-03-02 DIAGNOSIS — M6281 Muscle weakness (generalized): Secondary | ICD-10-CM | POA: Diagnosis not present

## 2023-03-02 DIAGNOSIS — Z9181 History of falling: Secondary | ICD-10-CM | POA: Diagnosis not present

## 2023-03-02 DIAGNOSIS — S0100XA Unspecified open wound of scalp, initial encounter: Secondary | ICD-10-CM | POA: Diagnosis not present

## 2023-03-02 DIAGNOSIS — I951 Orthostatic hypotension: Secondary | ICD-10-CM | POA: Diagnosis not present

## 2023-03-02 DIAGNOSIS — M24451 Recurrent dislocation, right hip: Secondary | ICD-10-CM | POA: Diagnosis not present

## 2023-03-03 ENCOUNTER — Other Ambulatory Visit: Payer: Self-pay | Admitting: *Deleted

## 2023-03-03 DIAGNOSIS — Z8673 Personal history of transient ischemic attack (TIA), and cerebral infarction without residual deficits: Secondary | ICD-10-CM | POA: Diagnosis not present

## 2023-03-03 DIAGNOSIS — R531 Weakness: Secondary | ICD-10-CM | POA: Diagnosis not present

## 2023-03-03 DIAGNOSIS — S0101XD Laceration without foreign body of scalp, subsequent encounter: Secondary | ICD-10-CM | POA: Diagnosis not present

## 2023-03-03 DIAGNOSIS — K219 Gastro-esophageal reflux disease without esophagitis: Secondary | ICD-10-CM | POA: Diagnosis not present

## 2023-03-03 DIAGNOSIS — M459 Ankylosing spondylitis of unspecified sites in spine: Secondary | ICD-10-CM | POA: Diagnosis not present

## 2023-03-03 DIAGNOSIS — I951 Orthostatic hypotension: Secondary | ICD-10-CM | POA: Diagnosis not present

## 2023-03-03 DIAGNOSIS — S73004D Unspecified dislocation of right hip, subsequent encounter: Secondary | ICD-10-CM | POA: Diagnosis not present

## 2023-03-03 NOTE — Patient Outreach (Signed)
Per [facility], Mr. Garverick resides in [facility] and [place]. Screening for potential chronic care management services as a benefit of health plan and primary care provider.  Secure communication and voicemail left for Alanda Amass social worker, to inquire about transition plans and potential chronic care management needs.   Will continue to follow.   Raiford Noble, MSN, RN, BSN [place]  [facility], [facility] RN Post- Acute Care Manager Direct Dial: [phone]

## 2023-03-05 DIAGNOSIS — I951 Orthostatic hypotension: Secondary | ICD-10-CM | POA: Diagnosis not present

## 2023-03-05 DIAGNOSIS — Z9181 History of falling: Secondary | ICD-10-CM | POA: Diagnosis not present

## 2023-03-05 DIAGNOSIS — R531 Weakness: Secondary | ICD-10-CM | POA: Diagnosis not present

## 2023-03-05 DIAGNOSIS — M459 Ankylosing spondylitis of unspecified sites in spine: Secondary | ICD-10-CM | POA: Diagnosis not present

## 2023-03-05 DIAGNOSIS — R278 Other lack of coordination: Secondary | ICD-10-CM | POA: Diagnosis not present

## 2023-03-05 DIAGNOSIS — S73004D Unspecified dislocation of right hip, subsequent encounter: Secondary | ICD-10-CM | POA: Diagnosis not present

## 2023-03-05 DIAGNOSIS — M24451 Recurrent dislocation, right hip: Secondary | ICD-10-CM | POA: Diagnosis not present

## 2023-03-05 DIAGNOSIS — K219 Gastro-esophageal reflux disease without esophagitis: Secondary | ICD-10-CM | POA: Diagnosis not present

## 2023-03-05 DIAGNOSIS — M6281 Muscle weakness (generalized): Secondary | ICD-10-CM | POA: Diagnosis not present

## 2023-03-05 DIAGNOSIS — S0101XD Laceration without foreign body of scalp, subsequent encounter: Secondary | ICD-10-CM | POA: Diagnosis not present

## 2023-03-05 DIAGNOSIS — Z8673 Personal history of transient ischemic attack (TIA), and cerebral infarction without residual deficits: Secondary | ICD-10-CM | POA: Diagnosis not present

## 2023-03-08 DIAGNOSIS — R531 Weakness: Secondary | ICD-10-CM | POA: Diagnosis not present

## 2023-03-08 DIAGNOSIS — K219 Gastro-esophageal reflux disease without esophagitis: Secondary | ICD-10-CM | POA: Diagnosis not present

## 2023-03-08 DIAGNOSIS — S73004D Unspecified dislocation of right hip, subsequent encounter: Secondary | ICD-10-CM | POA: Diagnosis not present

## 2023-03-08 DIAGNOSIS — I951 Orthostatic hypotension: Secondary | ICD-10-CM | POA: Diagnosis not present

## 2023-03-08 DIAGNOSIS — M459 Ankylosing spondylitis of unspecified sites in spine: Secondary | ICD-10-CM | POA: Diagnosis not present

## 2023-03-08 DIAGNOSIS — S0101XD Laceration without foreign body of scalp, subsequent encounter: Secondary | ICD-10-CM | POA: Diagnosis not present

## 2023-03-08 DIAGNOSIS — Z8673 Personal history of transient ischemic attack (TIA), and cerebral infarction without residual deficits: Secondary | ICD-10-CM | POA: Diagnosis not present

## 2023-03-09 ENCOUNTER — Other Ambulatory Visit: Payer: Self-pay | Admitting: *Deleted

## 2023-03-09 DIAGNOSIS — Z9181 History of falling: Secondary | ICD-10-CM | POA: Diagnosis not present

## 2023-03-09 DIAGNOSIS — R278 Other lack of coordination: Secondary | ICD-10-CM | POA: Diagnosis not present

## 2023-03-09 DIAGNOSIS — S0100XA Unspecified open wound of scalp, initial encounter: Secondary | ICD-10-CM | POA: Diagnosis not present

## 2023-03-09 DIAGNOSIS — M6281 Muscle weakness (generalized): Secondary | ICD-10-CM | POA: Diagnosis not present

## 2023-03-09 DIAGNOSIS — M24451 Recurrent dislocation, right hip: Secondary | ICD-10-CM | POA: Diagnosis not present

## 2023-03-09 NOTE — Patient Outreach (Signed)
Post-Acute Care Manager follow up. Steven [name] resides in [place] [facility] skilled nursing facility.  Screening for potential chronic care management services as a benefit of health plan and primary care provider.  Telephone call made to Emilie (spouse/DPR) [phone]. Explained complex care management services. Emilie reports Mr. Osmond has [facility] arranged. Denies having any further care management needs.   Will sign off. No identifiable Value based care CCM needs.   Raiford Noble, MSN, RN, BSN [place]  [facility], [facility] RN Post- Acute Care Manager Direct Dial: [phone]

## 2023-03-10 DIAGNOSIS — S0101XD Laceration without foreign body of scalp, subsequent encounter: Secondary | ICD-10-CM | POA: Diagnosis not present

## 2023-03-10 DIAGNOSIS — Z8673 Personal history of transient ischemic attack (TIA), and cerebral infarction without residual deficits: Secondary | ICD-10-CM | POA: Diagnosis not present

## 2023-03-10 DIAGNOSIS — M459 Ankylosing spondylitis of unspecified sites in spine: Secondary | ICD-10-CM | POA: Diagnosis not present

## 2023-03-10 DIAGNOSIS — S73004D Unspecified dislocation of right hip, subsequent encounter: Secondary | ICD-10-CM | POA: Diagnosis not present

## 2023-03-10 DIAGNOSIS — I951 Orthostatic hypotension: Secondary | ICD-10-CM | POA: Diagnosis not present

## 2023-03-10 DIAGNOSIS — K219 Gastro-esophageal reflux disease without esophagitis: Secondary | ICD-10-CM | POA: Diagnosis not present

## 2023-03-10 DIAGNOSIS — R531 Weakness: Secondary | ICD-10-CM | POA: Diagnosis not present

## 2023-03-15 DIAGNOSIS — S73004D Unspecified dislocation of right hip, subsequent encounter: Secondary | ICD-10-CM | POA: Diagnosis not present

## 2023-03-16 ENCOUNTER — Inpatient Hospital Stay (HOSPITAL_COMMUNITY)
Admission: EM | Admit: 2023-03-16 | Discharge: 2023-03-22 | DRG: 644 | Disposition: A | Discharge status: Home Health | Payer: Medicare Other | Source: Skilled Nursing Facility | Attending: Family Medicine | Admitting: Family Medicine

## 2023-03-16 ENCOUNTER — Other Ambulatory Visit: Payer: Self-pay

## 2023-03-16 DIAGNOSIS — Z96641 Presence of right artificial hip joint: Secondary | ICD-10-CM | POA: Diagnosis present

## 2023-03-16 DIAGNOSIS — Z833 Family history of diabetes mellitus: Secondary | ICD-10-CM

## 2023-03-16 DIAGNOSIS — R918 Other nonspecific abnormal finding of lung field: Secondary | ICD-10-CM | POA: Diagnosis not present

## 2023-03-16 DIAGNOSIS — E871 Hypo-osmolality and hyponatremia: Principal | ICD-10-CM | POA: Diagnosis present

## 2023-03-16 DIAGNOSIS — E785 Hyperlipidemia, unspecified: Secondary | ICD-10-CM | POA: Diagnosis present

## 2023-03-16 DIAGNOSIS — Z8249 Family history of ischemic heart disease and other diseases of the circulatory system: Secondary | ICD-10-CM | POA: Diagnosis not present

## 2023-03-16 DIAGNOSIS — Z800 Family history of malignant neoplasm of digestive organs: Secondary | ICD-10-CM

## 2023-03-16 DIAGNOSIS — Z888 Allergy status to other drugs, medicaments and biological substances status: Secondary | ICD-10-CM

## 2023-03-16 DIAGNOSIS — R69 Illness, unspecified: Secondary | ICD-10-CM | POA: Diagnosis not present

## 2023-03-16 DIAGNOSIS — I63232 Cerebral infarction due to unspecified occlusion or stenosis of left carotid arteries: Secondary | ICD-10-CM

## 2023-03-16 DIAGNOSIS — Z7902 Long term (current) use of antithrombotics/antiplatelets: Secondary | ICD-10-CM | POA: Diagnosis not present

## 2023-03-16 DIAGNOSIS — E861 Hypovolemia: Secondary | ICD-10-CM | POA: Diagnosis not present

## 2023-03-16 DIAGNOSIS — F329 Major depressive disorder, single episode, unspecified: Secondary | ICD-10-CM | POA: Diagnosis present

## 2023-03-16 DIAGNOSIS — E739 Lactose intolerance, unspecified: Secondary | ICD-10-CM | POA: Diagnosis present

## 2023-03-16 DIAGNOSIS — Z87442 Personal history of urinary calculi: Secondary | ICD-10-CM | POA: Diagnosis not present

## 2023-03-16 DIAGNOSIS — E222 Syndrome of inappropriate secretion of antidiuretic hormone: Principal | ICD-10-CM | POA: Diagnosis present

## 2023-03-16 DIAGNOSIS — M24451 Recurrent dislocation, right hip: Secondary | ICD-10-CM | POA: Diagnosis present

## 2023-03-16 DIAGNOSIS — I4510 Unspecified right bundle-branch block: Secondary | ICD-10-CM | POA: Diagnosis not present

## 2023-03-16 DIAGNOSIS — Z79899 Other long term (current) drug therapy: Secondary | ICD-10-CM | POA: Diagnosis not present

## 2023-03-16 DIAGNOSIS — Z8673 Personal history of transient ischemic attack (TIA), and cerebral infarction without residual deficits: Secondary | ICD-10-CM | POA: Diagnosis not present

## 2023-03-16 DIAGNOSIS — D509 Iron deficiency anemia, unspecified: Secondary | ICD-10-CM | POA: Diagnosis present

## 2023-03-16 DIAGNOSIS — Z803 Family history of malignant neoplasm of breast: Secondary | ICD-10-CM

## 2023-03-16 DIAGNOSIS — R5381 Other malaise: Secondary | ICD-10-CM | POA: Diagnosis present

## 2023-03-16 DIAGNOSIS — Z9049 Acquired absence of other specified parts of digestive tract: Secondary | ICD-10-CM | POA: Diagnosis not present

## 2023-03-16 DIAGNOSIS — Z87891 Personal history of nicotine dependence: Secondary | ICD-10-CM

## 2023-03-16 DIAGNOSIS — E876 Hypokalemia: Secondary | ICD-10-CM | POA: Diagnosis not present

## 2023-03-16 DIAGNOSIS — S0100XA Unspecified open wound of scalp, initial encounter: Secondary | ICD-10-CM | POA: Diagnosis not present

## 2023-03-16 DIAGNOSIS — E860 Dehydration: Secondary | ICD-10-CM | POA: Diagnosis not present

## 2023-03-16 DIAGNOSIS — J9811 Atelectasis: Secondary | ICD-10-CM | POA: Diagnosis not present

## 2023-03-16 DIAGNOSIS — Z7982 Long term (current) use of aspirin: Secondary | ICD-10-CM | POA: Diagnosis not present

## 2023-03-16 DIAGNOSIS — I639 Cerebral infarction, unspecified: Secondary | ICD-10-CM | POA: Diagnosis present

## 2023-03-16 DIAGNOSIS — M459 Ankylosing spondylitis of unspecified sites in spine: Secondary | ICD-10-CM | POA: Diagnosis not present

## 2023-03-16 DIAGNOSIS — Z8701 Personal history of pneumonia (recurrent): Secondary | ICD-10-CM

## 2023-03-16 DIAGNOSIS — Z743 Need for continuous supervision: Secondary | ICD-10-CM | POA: Diagnosis not present

## 2023-03-16 DIAGNOSIS — S2242XA Multiple fractures of ribs, left side, initial encounter for closed fracture: Secondary | ICD-10-CM | POA: Diagnosis not present

## 2023-03-16 DIAGNOSIS — Z7401 Bed confinement status: Secondary | ICD-10-CM | POA: Diagnosis not present

## 2023-03-16 LAB — CBC WITH DIFFERENTIAL/PLATELET
Abs Immature Granulocytes: 0.03 10*3/uL (ref 0.00–0.07)
Basophils Absolute: 0.1 10*3/uL (ref 0.0–0.1)
Basophils Relative: 1 %
Eosinophils Absolute: 0.4 10*3/uL (ref 0.0–0.5)
Eosinophils Relative: 6 %
HCT: 30.2 % — ABNORMAL LOW (ref 39.0–52.0)
Hemoglobin: 10.5 g/dL — ABNORMAL LOW (ref 13.0–17.0)
Immature Granulocytes: 0 %
Lymphocytes Relative: 16 %
Lymphs Abs: 1.1 10*3/uL (ref 0.7–4.0)
MCH: 31.7 pg (ref 26.0–34.0)
MCHC: 34.8 g/dL (ref 30.0–36.0)
MCV: 91.2 fL (ref 80.0–100.0)
Monocytes Absolute: 0.6 10*3/uL (ref 0.1–1.0)
Monocytes Relative: 9 %
Neutro Abs: 4.6 10*3/uL (ref 1.7–7.7)
Neutrophils Relative %: 68 %
Platelets: 346 10*3/uL (ref 150–400)
RBC: 3.31 MIL/uL — ABNORMAL LOW (ref 4.22–5.81)
RDW: 13.1 % (ref 11.5–15.5)
WBC: 6.8 10*3/uL (ref 4.0–10.5)
nRBC: 0 % (ref 0.0–0.2)

## 2023-03-16 LAB — BASIC METABOLIC PANEL
Anion gap: 8 (ref 5–15)
BUN: 8 mg/dL (ref 8–23)
CO2: 25 mmol/L (ref 22–32)
Calcium: 8.5 mg/dL — ABNORMAL LOW (ref 8.9–10.3)
Chloride: 87 mmol/L — ABNORMAL LOW (ref 98–111)
Creatinine, Ser: 0.57 mg/dL — ABNORMAL LOW (ref 0.61–1.24)
GFR, Estimated: >60 mL/min (ref >60)
Glucose, Bld: 100 mg/dL — ABNORMAL HIGH (ref 70–99)
Potassium: 3.7 mmol/L (ref 3.5–5.1)
Sodium: 120 mmol/L — ABNORMAL LOW (ref 135–145)

## 2023-03-16 MED ORDER — SODIUM CHLORIDE 0.9 % IV SOLN: INTRAVENOUS | Status: AC

## 2023-03-16 MED ORDER — SODIUM CHLORIDE 0.9 % IV BOLUS
500.0000 mL | Freq: Once | INTRAVENOUS | Status: AC
  Administered 2023-03-16: 500 mL via INTRAVENOUS

## 2023-03-16 NOTE — ED Provider Notes (Signed)
[place] EMERGENCY DEPARTMENT AT [facility] Provider Note   CSN: [id number] Arrival date & time: 03/16/23  2211     History  Chief Complaint  Patient presents with   Hyponatremia    NA 122     RESHAWN GHATTAS is a 88 y.o. male.  88 year old male presents today for concern of hyponatremia.  He was recently discharged to rehab facility after fracture.  Endorses decreased p.o. intake over the past 10 days.  Endorses worsening weakness over the past few days.  Denies abdominal pain, chest pain, shortness of breath.  Denies any urinary complaints.  Not on any diuretics.  No history of CHF.  Denies diarrhea, or vomiting.  The history is provided by the patient. No language interpreter was used.       Home Medications Prior to Admission medications   Medication Sig Start Date End Date Taking? Authorizing Provider  acetaminophen (TYLENOL) 325 MG tablet Take 325-650 mg by mouth every 6 (six) hours as needed for mild pain (pain score 1-3) or headache.    [provider]  Artificial Tear Solution (SOOTHE XP) SOLN Place 1 drop into both eyes 2 (two) times daily as needed (for dryness).    [provider]  aspirin EC 325 MG EC tablet Take 1 tablet (325 mg total) by mouth daily. 04/02/21   Hongalgi, Maximino Greenland, MD  Biotin 5000 MCG TABS Take 5,000 mcg by mouth daily.    [provider]  buPROPion (WELLBUTRIN XL) 300 MG 24 hr tablet Take 300 mg by mouth at bedtime.    [provider]  Calcium Carb-Cholecalciferol (CALCIUM+D3 PO) Take 2 tablets by mouth 2 (two) times daily with a meal.    [provider]  Calcium Citrate 250 MG TABS Take 250 mg by mouth daily.    [provider]  Cholecalciferol (VITAMIN D3) 1000 units CAPS Take 2,000 Units by mouth daily.    [provider]  clopidogrel (PLAVIX) 75 MG tablet Take 1 tablet (75 mg total) by mouth daily. Patient taking differently: Take 75 mg by mouth at bedtime. 04/02/21    Hongalgi, Maximino Greenland, MD  cyanocobalamin (VITAMIN B12) 1000 MCG tablet Take 1,000 mcg by mouth daily.    [provider]  fluoruracil (CARAC) 0.5 % cream Apply 1 application  topically daily as needed (for cancer treatment- as directed to affected areas).    [provider]  folic acid (FOLVITE) 1 MG tablet Take 1 mg by mouth in the morning, at noon, and at bedtime.    [provider]  GEMTESA 75 MG TABS Take 75 mg by mouth at bedtime. 06/01/22   [provider]  ipratropium (ATROVENT) 0.06 % nasal spray Place 2 sprays into both nostrils 2 (two) times daily as needed (allergies). 06/05/19   [provider]  methotrexate (RHEUMATREX) 2.5 MG tablet Take 20 mg by mouth every Sunday.    [provider]  Multiple Vitamins-Minerals (PRESERVISION AREDS 2 PO) Take 1 capsule by mouth in the morning and at bedtime.    [provider]  oxyCODONE (ROXICODONE) 5 MG immediate release tablet Take 1 tablet (5 mg total) by mouth every 6 (six) hours as needed for severe pain (pain score 7-10). 02/23/23   Hughie Closs, MD  pantoprazole (PROTONIX) 40 MG tablet Take 1 tablet (40 mg total) by mouth daily. Patient taking differently: Take 40 mg by mouth daily before breakfast. 04/02/21   Hongalgi, Maximino Greenland, MD  rosuvastatin (CRESTOR) 20  MG tablet Take 1 tablet (20 mg total) by mouth at bedtime. 04/01/21   Hongalgi, Maximino Greenland, MD      Allergies    Indomethacin and Lactose intolerance (gi)    Review of Systems   Review of Systems  Constitutional:  Negative for chills and fever.  Respiratory:  Negative for shortness of breath.   Cardiovascular:  Negative for chest pain.  Gastrointestinal:  Negative for abdominal pain, diarrhea, nausea and vomiting.  Neurological:  Negative for light-headedness.  All other systems reviewed and are negative.   Physical Exam Updated Vital Signs BP 136/67 (BP Location: Left Arm)   Pulse 69   Temp 97.6 F (36.4 C) (Oral)   Ht  5\' 7"  (1.702 m)   Wt 50.8 kg   SpO2 100%   BMI 17.54 kg/m  Physical Exam Vitals and nursing note reviewed.  Constitutional:      General: He is not in acute distress.    Appearance: Normal appearance. He is ill-appearing (chronically ill appearing).  HENT:     Head: Normocephalic and atraumatic.     Nose: Nose normal.  Eyes:     Conjunctiva/sclera: Conjunctivae normal.  Cardiovascular:     Rate and Rhythm: Normal rate and regular rhythm.  Pulmonary:     Effort: Pulmonary effort is normal. No respiratory distress.     Breath sounds: Normal breath sounds. No wheezing.  Abdominal:     General: There is no distension.     Tenderness: There is no abdominal tenderness. There is no guarding.  Musculoskeletal:        General: No deformity. Normal range of motion.     Cervical back: Normal range of motion.  Skin:    Findings: No rash.  Neurological:     Mental Status: He is alert.     ED Results / Procedures / Treatments   Labs (all labs ordered are listed, but only abnormal results are displayed) Labs Reviewed  CBC WITH DIFFERENTIAL/PLATELET - Abnormal; Notable for the following components:      Result Value   RBC 3.31 (*)    Hemoglobin 10.5 (*)    HCT 30.2 (*)    All other components within normal limits  BASIC METABOLIC PANEL - Abnormal; Notable for the following components:   Sodium 120 (*)    Chloride 87 (*)    Glucose, Bld 100 (*)    Creatinine, Ser 0.57 (*)    Calcium 8.5 (*)    All other components within normal limits  RESP PANEL BY RT-PCR (RSV, FLU A&B, COVID)  RVPGX2  URINALYSIS, ROUTINE W REFLEX MICROSCOPIC  OSMOLALITY  OSMOLALITY, URINE  SODIUM, URINE, RANDOM    EKG EKG Interpretation Date/Time:  Tuesday March 16 2023 22:24:43 EST Ventricular Rate:  68 PR Interval:  211 QRS Duration:  145 QT Interval:  448 QTC Calculation: 477 R Axis:   54  Text Interpretation: Sinus or ectopic atrial rhythm Right bundle branch block No significant change  since last tracing Confirmed by Richardean Canal [phone]) on 03/16/2023 10:29:42 PM  Radiology No results found.  Procedures .Critical Care  Performed by: Marita Kansas, PA-C Authorized by: Marita Kansas, PA-C   Critical care provider statement:    Critical care time (minutes):  31   Critical care was necessary to treat or prevent imminent or life-threatening deterioration of the following conditions:  Metabolic crisis   Critical care was time spent personally by me on the following activities:  Development of treatment plan with patient  or surrogate, discussions with consultants, evaluation of patient's response to treatment, examination of patient, ordering and review of laboratory studies, ordering and review of radiographic studies, ordering and performing treatments and interventions, pulse oximetry, re-evaluation of patient's condition and review of old charts   Care discussed with: admitting provider       Medications Ordered in ED Medications  0.9 %  sodium chloride infusion (has no administration in time range)  sodium chloride 0.9 % bolus 500 mL (500 mLs Intravenous New Bag/Given 03/16/23 2304)    ED Course/ Medical Decision Making/ A&P Clinical Course as of 03/16/23 2343  Tue Mar 16, 2023  2246 EKG 12-Lead [AA]    Clinical Course User Index [AA] Marita Kansas, PA-C                                 Medical Decision Making Amount and/or Complexity of Data Reviewed Labs: ordered. ECG/medicine tests:  Decision-making details documented in ED Course.  Risk Prescription drug management. Decision regarding hospitalization.   Medical Decision Making / ED Course   This patient presents to the ED for concern of hyponatremia, this involves an extensive number of treatment options, and is a complaint that carries with it a high risk of complications and morbidity.  The differential diagnosis includes hyponatremia, seizures, lab error  MDM: 88 year old male presents today for concern  of hyponatremia.  Found to be hyponatremic at the nursing home facility.  Asymptomatic otherwise.  No other complaints at this time.  Consistent with hypovolemia hyponatremia.  Fluids ordered.  Labs ordered.  No diuretics.  CBC without leukocytosis.  Mild anemia but no evidence of bleeding.  BMP shows sodium of 120, preserved renal function.  Additional labs related to hyponatremia ordered in anticipation of admission.  Discussed with hospitalist who will evaluate patient for admission.  Additional history obtained: -Additional history obtained from recent admission that required rehab placement due to fracture, normal sodium level down. -External records from outside source obtained and reviewed including: Chart review including previous notes, labs, imaging, consultation notes   Lab Tests: -I ordered, reviewed, and interpreted labs.   The pertinent results include:   Labs Reviewed  CBC WITH DIFFERENTIAL/PLATELET - Abnormal; Notable for the following components:      Result Value   RBC 3.31 (*)    Hemoglobin 10.5 (*)    HCT 30.2 (*)    All other components within normal limits  BASIC METABOLIC PANEL - Abnormal; Notable for the following components:   Sodium 120 (*)    Chloride 87 (*)    Glucose, Bld 100 (*)    Creatinine, Ser 0.57 (*)    Calcium 8.5 (*)    All other components within normal limits  RESP PANEL BY RT-PCR (RSV, FLU A&B, COVID)  RVPGX2  URINALYSIS, ROUTINE W REFLEX MICROSCOPIC  OSMOLALITY  OSMOLALITY, URINE  SODIUM, URINE, RANDOM      EKG  EKG Interpretation Date/Time:  Tuesday March 16 2023 22:24:43 EST Ventricular Rate:  68 PR Interval:  211 QRS Duration:  145 QT Interval:  448 QTC Calculation: 477 R Axis:   54  Text Interpretation: Sinus or ectopic atrial rhythm Right bundle branch block No significant change since last tracing Confirmed by Richardean Canal [phone]) on 03/16/2023 10:29:42 PM        Medicines ordered and prescription drug  management: Meds ordered this encounter  Medications   sodium chloride 0.9 % bolus  500 mL   0.9 %  sodium chloride infusion    -I have reviewed the patients home medicines and have made adjustments as needed  Critical interventions Admission for hyponatremia, volume resuscitation with normal saline  Reevaluation: After the interventions noted above, I reevaluated the patient and found that they have :stayed the same  Co morbidities that complicate the patient evaluation  Past Medical History:  Diagnosis Date   Allergy    enviromental   Anemia 2017   Anxiety    Arthritis    ankylosing spodilitis   Blood transfusion without reported diagnosis    during surgery   Cataract    Depression    Dyspnea    with exertion - had Echo done 09/29/16   History of kidney stones    Hyperlipidemia    Intestinal obstruction (HCC)    Pneumonia    as a child      Dispostion: Discussed with hospitalist.  They will evaluate patient for admission.   Final Clinical Impression(s) / ED Diagnoses Final diagnoses:  Hyponatremia    Rx / DC Orders ED Discharge Orders     None         Marita Kansas, PA-C 03/17/23 0017    Charlynne Pander, MD 03/17/23 [phone]

## 2023-03-16 NOTE — ED Triage Notes (Signed)
Pt came in via EMS from [facility] for hyponatremia. No complaint from pt. Pt wear hip brace for hip displacement  BP 118/62 HR 98 RR 16 O2 100% RA Pt

## 2023-03-16 NOTE — ED Provider Notes (Incomplete)
[place] EMERGENCY DEPARTMENT AT [facility] Provider Note   CSN: [id number] Arrival date & time: 03/16/23  2211     History {Add pertinent medical, surgical, social history, OB history to HPI:1} Chief Complaint  Patient presents with  . Hyponatremia    NA 122     Steven [name] is a 88 y.o. male.  88 year old male presents today for concern of hyponatremia.  He was recently discharged to rehab facility after fracture yeah        Home Medications Prior to Admission medications   Medication Sig Start Date End Date Taking? Authorizing Provider  acetaminophen (TYLENOL) 325 MG tablet Take 325-650 mg by mouth every 6 (six) hours as needed for mild pain (pain score 1-3) or headache.    [provider]  Artificial Tear Solution (SOOTHE XP) SOLN Place 1 drop into both eyes 2 (two) times daily as needed (for dryness).    [provider]  aspirin EC 325 MG EC tablet Take 1 tablet (325 mg total) by mouth daily. 04/02/21   Hongalgi, Maximino Greenland, MD  Biotin 5000 MCG TABS Take 5,000 mcg by mouth daily.    [provider]  buPROPion (WELLBUTRIN XL) 300 MG 24 hr tablet Take 300 mg by mouth at bedtime.    [provider]  Calcium Carb-Cholecalciferol (CALCIUM+D3 PO) Take 2 tablets by mouth 2 (two) times daily with a meal.    [provider]  Calcium Citrate 250 MG TABS Take 250 mg by mouth daily.    [provider]  Cholecalciferol (VITAMIN D3) 1000 units CAPS Take 2,000 Units by mouth daily.    [provider]  clopidogrel (PLAVIX) 75 MG tablet Take 1 tablet (75 mg total) by mouth daily. Patient taking differently: Take 75 mg by mouth at bedtime. 04/02/21   Hongalgi, Maximino Greenland, MD  cyanocobalamin (VITAMIN B12) 1000 MCG tablet Take 1,000 mcg by mouth daily.    [provider]  fluoruracil (CARAC) 0.5 % cream Apply 1 application  topically daily as needed (for cancer treatment- as directed to affected areas).     [provider]  folic acid (FOLVITE) 1 MG tablet Take 1 mg by mouth in the morning, at noon, and at bedtime.    [provider]  GEMTESA 75 MG TABS Take 75 mg by mouth at bedtime. 06/01/22   [provider]  ipratropium (ATROVENT) 0.06 % nasal spray Place 2 sprays into both nostrils 2 (two) times daily as needed (allergies). 06/05/19   [provider]  methotrexate (RHEUMATREX) 2.5 MG tablet Take 20 mg by mouth every Sunday.    [provider]  Multiple Vitamins-Minerals (PRESERVISION AREDS 2 PO) Take 1 capsule by mouth in the morning and at bedtime.    [provider]  oxyCODONE (ROXICODONE) 5 MG immediate release tablet Take 1 tablet (5 mg total) by mouth every 6 (six) hours as needed for severe pain (pain score 7-10). 02/23/23   Hughie Closs, MD  pantoprazole (PROTONIX) 40 MG tablet Take 1 tablet (40 mg total) by mouth daily. Patient taking differently: Take 40 mg by mouth daily before breakfast. 04/02/21   Hongalgi, Maximino Greenland, MD  rosuvastatin (CRESTOR) 20 MG tablet Take 1 tablet (20 mg total) by mouth at bedtime. 04/01/21   Hongalgi, Maximino Greenland, MD      Allergies    Indomethacin and Lactose intolerance (gi)    Review of Systems   Review of Systems  Physical Exam Updated Vital Signs  BP 136/67 (BP Location: Left Arm)   Pulse 69   Temp 97.6 F (36.4 C) (Oral)   Ht 5\' 7"  (1.702 m)   Wt 50.8 kg   SpO2 100%   BMI 17.54 kg/m  Physical Exam  ED Results / Procedures / Treatments   Labs (all labs ordered are listed, but only abnormal results are displayed) Labs Reviewed  CBC WITH DIFFERENTIAL/PLATELET - Abnormal; Notable for the following components:      Result Value   RBC 3.31 (*)    Hemoglobin 10.5 (*)    HCT 30.2 (*)    All other components within normal limits  BASIC METABOLIC PANEL - Abnormal; Notable for the following components:   Sodium 120 (*)    Chloride 87 (*)    Glucose, Bld 100 (*)    Creatinine, Ser 0.57 (*)     Calcium 8.5 (*)    All other components within normal limits  RESP PANEL BY RT-PCR (RSV, FLU A&B, COVID)  RVPGX2  URINALYSIS, ROUTINE W REFLEX MICROSCOPIC  OSMOLALITY  OSMOLALITY, URINE  SODIUM, URINE, RANDOM    EKG EKG Interpretation Date/Time:  Tuesday March 16 2023 22:24:43 EST Ventricular Rate:  68 PR Interval:  211 QRS Duration:  145 QT Interval:  448 QTC Calculation: 477 R Axis:   54  Text Interpretation: Sinus or ectopic atrial rhythm Right bundle branch block No significant change since last tracing Confirmed by Richardean Canal [phone]) on 03/16/2023 10:29:42 PM  Radiology No results found.  Procedures Procedures  {Document cardiac monitor, telemetry assessment procedure when appropriate:1}  Medications Ordered in ED Medications  0.9 %  sodium chloride infusion (has no administration in time range)  sodium chloride 0.9 % bolus 500 mL (500 mLs Intravenous New Bag/Given 03/16/23 2304)    ED Course/ Medical Decision Making/ A&P Clinical Course as of 03/16/23 2343  Tue Mar 16, 2023  2246 EKG 12-Lead [AA]    Clinical Course User Index [AA] Marita Kansas, PA-C   {   Click here for ABCD2, HEART and other calculatorsREFRESH Note before signing :1}                              Medical Decision Making Amount and/or Complexity of Data Reviewed Labs: ordered. ECG/medicine tests:  Decision-making details documented in ED Course.  Risk Prescription drug management. Decision regarding hospitalization.   ***  {Document critical care time when appropriate:1} {Document review of labs and clinical decision tools ie heart score, Chads2Vasc2 etc:1}  {Document your independent review of radiology images, and any outside records:1} {Document your discussion with family members, caretakers, and with consultants:1} {Document social determinants of health affecting pt's care:1} {Document your decision making why or why not admission, treatments were needed:1} Final Clinical  Impression(s) / ED Diagnoses Final diagnoses:  None    Rx / DC Orders ED Discharge Orders     None

## 2023-03-17 ENCOUNTER — Encounter (HOSPITAL_COMMUNITY): Payer: Self-pay | Admitting: Family Medicine

## 2023-03-17 DIAGNOSIS — E871 Hypo-osmolality and hyponatremia: Secondary | ICD-10-CM | POA: Diagnosis not present

## 2023-03-17 LAB — URINALYSIS, ROUTINE W REFLEX MICROSCOPIC
Bilirubin Urine: NEGATIVE
Glucose, UA: NEGATIVE mg/dL
Hgb urine dipstick: NEGATIVE
Ketones, ur: NEGATIVE mg/dL
Leukocytes,Ua: NEGATIVE
Nitrite: NEGATIVE
Protein, ur: NEGATIVE mg/dL
Specific Gravity, Urine: 1.009 (ref 1.005–1.030)
pH: 6 (ref 5.0–8.0)

## 2023-03-17 LAB — BASIC METABOLIC PANEL
Anion gap: 8 (ref 5–15)
Anion gap: 7 (ref 5–15)
Anion gap: 6 (ref 5–15)
BUN: 7 mg/dL — ABNORMAL LOW (ref 8–23)
BUN: 6 mg/dL — ABNORMAL LOW (ref 8–23)
BUN: 6 mg/dL — ABNORMAL LOW (ref 8–23)
CO2: 23 mmol/L (ref 22–32)
CO2: 24 mmol/L (ref 22–32)
CO2: 25 mmol/L (ref 22–32)
Calcium: 8.2 mg/dL — ABNORMAL LOW (ref 8.9–10.3)
Calcium: 7.9 mg/dL — ABNORMAL LOW (ref 8.9–10.3)
Calcium: 8.3 mg/dL — ABNORMAL LOW (ref 8.9–10.3)
Chloride: 93 mmol/L — ABNORMAL LOW (ref 98–111)
Chloride: 94 mmol/L — ABNORMAL LOW (ref 98–111)
Chloride: 93 mmol/L — ABNORMAL LOW (ref 98–111)
Creatinine, Ser: 0.49 mg/dL — ABNORMAL LOW (ref 0.61–1.24)
Creatinine, Ser: 0.43 mg/dL — ABNORMAL LOW (ref 0.61–1.24)
Creatinine, Ser: 0.52 mg/dL — ABNORMAL LOW (ref 0.61–1.24)
GFR, Estimated: >60 mL/min (ref >60)
GFR, Estimated: >60 mL/min (ref >60)
GFR, Estimated: >60 mL/min (ref >60)
Glucose, Bld: 79 mg/dL (ref 70–99)
Glucose, Bld: 85 mg/dL (ref 70–99)
Glucose, Bld: 101 mg/dL — ABNORMAL HIGH (ref 70–99)
Potassium: 3.4 mmol/L — ABNORMAL LOW (ref 3.5–5.1)
Potassium: 3.6 mmol/L (ref 3.5–5.1)
Potassium: 4.2 mmol/L (ref 3.5–5.1)
Sodium: 124 mmol/L — ABNORMAL LOW (ref 135–145)
Sodium: 125 mmol/L — ABNORMAL LOW (ref 135–145)
Sodium: 124 mmol/L — ABNORMAL LOW (ref 135–145)

## 2023-03-17 LAB — OSMOLALITY
Osmolality: 266 mosm/kg — ABNORMAL LOW (ref 275–295)
Osmolality: 266 mosm/kg — ABNORMAL LOW (ref 275–295)

## 2023-03-17 LAB — RESP PANEL BY RT-PCR (RSV, FLU A&B, COVID)  RVPGX2
Influenza A by PCR: NEGATIVE
Influenza B by PCR: NEGATIVE
Resp Syncytial Virus by PCR: NEGATIVE
SARS Coronavirus 2 by RT PCR: NEGATIVE

## 2023-03-17 LAB — CBC
HCT: 28 % — ABNORMAL LOW (ref 39.0–52.0)
Hemoglobin: 9.8 g/dL — ABNORMAL LOW (ref 13.0–17.0)
MCH: 32.1 pg (ref 26.0–34.0)
MCHC: 35 g/dL (ref 30.0–36.0)
MCV: 91.8 fL (ref 80.0–100.0)
Platelets: 300 10*3/uL (ref 150–400)
RBC: 3.05 MIL/uL — ABNORMAL LOW (ref 4.22–5.81)
RDW: 13.2 % (ref 11.5–15.5)
WBC: 5.5 10*3/uL (ref 4.0–10.5)
nRBC: 0 % (ref 0.0–0.2)

## 2023-03-17 LAB — SODIUM, URINE, RANDOM
Sodium, Ur: 83 mmol/L
Sodium, Ur: 115 mmol/L

## 2023-03-17 LAB — OSMOLALITY, URINE: Osmolality, Ur: 332 mosm/kg (ref 300–900)

## 2023-03-17 MED ORDER — ASPIRIN 325 MG PO TBEC
325.0000 mg | DELAYED_RELEASE_TABLET | Freq: Every day | ORAL | Status: DC
  Administered 2023-03-17 – 2023-03-22 (×6): 325 mg via ORAL
  Filled 2023-03-17 (×6): qty 1

## 2023-03-17 MED ORDER — ACETAMINOPHEN 325 MG PO TABS: 650.0000 mg | ORAL_TABLET | Freq: Four times a day (QID) | ORAL | Status: DC | PRN

## 2023-03-17 MED ORDER — CLOPIDOGREL BISULFATE 75 MG PO TABS
75.0000 mg | ORAL_TABLET | Freq: Every day | ORAL | Status: DC
  Administered 2023-03-17 – 2023-03-21 (×5): 75 mg via ORAL
  Filled 2023-03-17 (×5): qty 1

## 2023-03-17 MED ORDER — OXYCODONE HCL 5 MG PO TABS: 5.0000 mg | ORAL_TABLET | ORAL | Status: DC | PRN

## 2023-03-17 MED ORDER — BISACODYL 5 MG PO TBEC: 5.0000 mg | DELAYED_RELEASE_TABLET | Freq: Every day | ORAL | Status: DC | PRN

## 2023-03-17 MED ORDER — ROSUVASTATIN CALCIUM 10 MG PO TABS
20.0000 mg | ORAL_TABLET | Freq: Every day | ORAL | Status: DC
  Administered 2023-03-17 – 2023-03-21 (×5): 20 mg via ORAL
  Filled 2023-03-17 (×5): qty 2

## 2023-03-17 MED ORDER — ENOXAPARIN SODIUM 40 MG/0.4ML IJ SOSY
40.0000 mg | PREFILLED_SYRINGE | INTRAMUSCULAR | Status: DC
  Administered 2023-03-17 – 2023-03-22 (×5): 40 mg via SUBCUTANEOUS
  Filled 2023-03-17 (×6): qty 0.4

## 2023-03-17 MED ORDER — BUPROPION HCL ER (XL) 150 MG PO TB24
150.0000 mg | ORAL_TABLET | Freq: Every day | ORAL | Status: DC
  Administered 2023-03-17 – 2023-03-18 (×2): 150 mg via ORAL
  Filled 2023-03-17 (×3): qty 1

## 2023-03-17 MED ORDER — SODIUM CHLORIDE 0.9 % IV SOLN: INTRAVENOUS | Status: AC

## 2023-03-17 MED ORDER — POTASSIUM CHLORIDE CRYS ER 20 MEQ PO TBCR
20.0000 meq | EXTENDED_RELEASE_TABLET | Freq: Once | ORAL | Status: AC
  Administered 2023-03-17: 20 meq via ORAL
  Filled 2023-03-17: qty 1

## 2023-03-17 MED ORDER — MIRABEGRON ER 25 MG PO TB24
25.0000 mg | ORAL_TABLET | Freq: Every day | ORAL | Status: DC
  Administered 2023-03-17 – 2023-03-22 (×6): 25 mg via ORAL
  Filled 2023-03-17 (×6): qty 1

## 2023-03-17 MED ORDER — MELATONIN 5 MG PO TABS
5.0000 mg | ORAL_TABLET | Freq: Every day | ORAL | Status: DC
  Administered 2023-03-17 – 2023-03-21 (×5): 5 mg via ORAL
  Filled 2023-03-17 (×5): qty 1

## 2023-03-17 MED ORDER — SODIUM CHLORIDE 0.9% FLUSH
3.0000 mL | Freq: Two times a day (BID) | INTRAVENOUS | Status: DC
  Administered 2023-03-17 – 2023-03-22 (×11): 3 mL via INTRAVENOUS

## 2023-03-17 MED ORDER — PANTOPRAZOLE SODIUM 40 MG PO TBEC
40.0000 mg | DELAYED_RELEASE_TABLET | Freq: Every day | ORAL | Status: DC
  Administered 2023-03-17 – 2023-03-22 (×6): 40 mg via ORAL
  Filled 2023-03-17 (×6): qty 1

## 2023-03-17 MED ORDER — ACETAMINOPHEN 650 MG RE SUPP: 650.0000 mg | Freq: Four times a day (QID) | RECTAL | Status: DC | PRN

## 2023-03-17 MED ORDER — POLYETHYLENE GLYCOL 3350 17 G PO PACK: 17.0000 g | PACK | Freq: Every day | ORAL | Status: DC | PRN

## 2023-03-17 NOTE — Progress Notes (Signed)
[facility] PROGRESS NOTE  Steven [name] (DOB: [birth year]) ZOX:[id number] PCP: [name], MD  Brief Narrative: Steven [name] is an 88 y.o. male with a history of ankylosing spondylitis, history of CVA, GERD, s/p right THA complicated by dislocation who presented to the ED from SNF on 03/16/2023 due to low sodium level found on labs. He reported no change in chronic fatigue, imbalance/dizziness and no change in oral intake of late. He had stable vital signs. Labs are most notable for sodium 120, normal creatinine, normal WBC. He was given 500 mL of normal saline in the ED and admitted.   Subjective: No new complaints, hasn't gotten breakfast yet this morning.  Objective: BP 123/66   Pulse 97   Temp 97.9 F (36.6 C) (Oral)   Resp 15   Ht 5\' 7"  (1.702 m)   Wt 50.8 kg   SpO2 100%   BMI 17.54 kg/m   Gen: No distress, elderly and pleasant male Pulm: Clear, nonlabored  CV: RRR, no MRG or edema GI: Soft, NT, ND, +BS  Neuro: Alert and oriented. No new focal deficits. Ext: Warm, no deformities, hip brace in place, distally NVI   Assessment & Plan: Hyponatremia:  - Continue serial BMPs. Na 120 > 124 thus far, improving at goal rate. Pt without specifically attributable symptoms to necessitate hypertonic saline.  - Regular diet w/fluid restriction. UNa inappropriately elevated. Hasn't eaten today, came in with slight elevation in Cr from baseline (not rising to the level of AKI), will give another 1L IVF through today. Continues to appear roughly euvolemic, recheck urine sodium, urine and serum osm's.   History of CVA:  - Continue ASA, plavix, rosuvastatin  Ankylosing spondylitis:  - Continue MTX  Hypokalemia:  - Supplement  Iron deficiency anemia: Chronic, stable, well managed based on normocytic indices.  Hgb 9.8 g/dl  s/p right THA and hip dislocations:  - Continue brace for now - Discuss with orthopedics Re: outpatient f/u, brace removal.   [name], MD [facility] [email or web address] 03/17/2023, 8:55 AM

## 2023-03-17 NOTE — H&P (Signed)
History and Physical    Steven [name] HQI:[id number] DOB: 06-22-1934 DOA: 03/16/2023  PCP: [name], MD   Patient coming from: SNF   Chief Complaint: Low sodium   HPI: Steven [name] is a 88 y.o. male with medical history significant for ankylosing spondylitis, history of CVA, GERD, history of total right hip arthroplasty with spontaneous dislocations last month who presents for evaluation of hyponatremia.   The patient has no complaints and states that it lab abnormality was identified on routine blood work that prompted his transfer to the ED.  On direct questioning, he acknowledges some balance difficulty, fatigue, and dizziness, but states that these are all chronic and have not worsened recently.  He denies nausea, vomiting, or diarrhea.  He denies loss of appetite, new medications, or drinking more fluids than usual.  He states that he has been told that he is dehydrated.  ED Course: Upon arrival to the ED, patient is found to be afebrile and saturating well on room air with normal heart rate and stable blood pressure.  Labs are most notable for sodium 120, normal creatinine, normal WBC, and negative respiratory virus panel.  Patient was given 500 mL of normal saline in the ED.  Review of Systems:  All other systems reviewed and apart from HPI, are negative.  Past Medical History:  Diagnosis Date   Allergy    enviromental   Anemia 2017   Anxiety    Arthritis    ankylosing spodilitis   Blood transfusion without reported diagnosis    during surgery   Cataract    Depression    Dyspnea    with exertion - had Echo done 09/29/16   History of kidney stones    Hyperlipidemia    Intestinal obstruction (HCC)    Pneumonia    as a child    Past Surgical History:  Procedure Laterality Date   ABDOMINAL SURGERY     had abcess   APPENDECTOMY     CHOLECYSTECTOMY     with lysis of adhesions   COLONOSCOPY     COLOSTOMY     COLOSTOMY REVERSAL     EYE SURGERY Left    scar  tissue removed from cornea   EYE SURGERY Right    cataract surgery with lens implant   JOINT REPLACEMENT Right    hip  x 2 1999 and 2007   SHOULDER ARTHROSCOPY WITH ROTATOR CUFF REPAIR AND SUBACROMIAL DECOMPRESSION Left 03/02/2013   Procedure: LEFT SHOULDER ARTHROSCOPY WITH SUBACROMIAL DECOMPRESSION DISTAL CALVICLE RESECTION AND ROTATOR CUFF REPAIR ;  Surgeon: Senaida Lange, MD;  Location: [facility] OR;  Service: Orthopedics;  Laterality: Left;   TOTAL HIP REVISION Right 11/06/2016   Procedure: TOTAL HIP REVISION OF THE ACETABULAR COMPONENT;  Surgeon: Gean Birchwood, MD;  Location: [facility] OR;  Service: Orthopedics;  Laterality: Right;   TRANSCAROTID ARTERY REVASCULARIZATION  Left 03/31/2021   Procedure: TRANSCAROTID ARTERY REVASCULARIZATION;  Surgeon: Victorino Sparrow, MD;  Location: [facility] OR;  Service: Vascular;  Laterality: Left;   ULTRASOUND GUIDANCE FOR VASCULAR ACCESS Right 03/31/2021   Procedure: ULTRASOUND GUIDANCE FOR VASCULAR ACCESS;  Surgeon: Victorino Sparrow, MD;  Location: [facility] OR;  Service: Vascular;  Laterality: Right;    Social History:   reports that he quit smoking about 57 years ago. His smoking use included cigarettes. He started smoking about 66 years ago. He has a 9 pack-year smoking history. He has never been exposed to tobacco smoke. He has never used smokeless tobacco. He reports  current alcohol use. He reports that he does not use drugs.  Allergies  Allergen Reactions   Indomethacin Hives   Lactose Intolerance (Gi) Nausea And Vomiting and Other (See Comments)    GI UPSET    Family History  Problem Relation Age of Onset   Heart attack Mother    Diabetes Mother    Breast cancer Mother    Heart attack Maternal Grandfather    Pancreatic cancer Brother    Colon cancer Neg Hx    Esophageal cancer Neg Hx    Stomach cancer Neg Hx      Prior to Admission medications   Medication Sig Start Date End Date Taking? Authorizing Provider  acetaminophen (TYLENOL) 325 MG tablet Take  325-650 mg by mouth every 6 (six) hours as needed for mild pain (pain score 1-3) or headache.   Yes [provider]  Artificial Tear Solution (SOOTHE XP) SOLN Place 1 drop into both eyes 2 (two) times daily as needed (for dryness).   Yes [provider]  aspirin EC 325 MG EC tablet Take 1 tablet (325 mg total) by mouth daily. 04/02/21  Yes Hongalgi, Maximino Greenland, MD  Biotin 5000 MCG TABS Take 5,000 mcg by mouth daily.   Yes [provider]  buPROPion (WELLBUTRIN XL) 150 MG 24 hr tablet Take 150 mg by mouth daily.   Yes [provider]  Calcium Carb-Cholecalciferol (CALCIUM+D3 PO) Take 2 tablets by mouth 2 (two) times daily with a meal.   Yes [provider]  Calcium Citrate 250 MG TABS Take 250 mg by mouth daily.   Yes [provider]  Cholecalciferol (VITAMIN D3) 1000 units CAPS Take 2,000 Units by mouth daily.   Yes [provider]  clopidogrel (PLAVIX) 75 MG tablet Take 1 tablet (75 mg total) by mouth daily. Patient taking differently: Take 75 mg by mouth at bedtime. 04/02/21  Yes Hongalgi, Maximino Greenland, MD  cyanocobalamin (VITAMIN B12) 1000 MCG tablet Take 1,000 mcg by mouth daily.   Yes [provider]  folic acid (FOLVITE) 1 MG tablet Take 1 mg by mouth in the morning and at bedtime.   Yes [provider]  ipratropium (ATROVENT) 0.06 % nasal spray Place 2 sprays into both nostrils 2 (two) times daily as needed (allergies). 06/05/19  Yes [provider]  lactose free nutrition (BOOST) LIQD Take 237 mLs by mouth 2 (two) times daily between meals.   Yes [provider]  melatonin 5 MG TABS Take 5 mg by mouth at bedtime.   Yes [provider]  Multiple Vitamins-Minerals (PRESERVISION AREDS 2 PO) Take 1 capsule by mouth in the morning and at bedtime.   Yes [provider]  oxyCODONE (ROXICODONE) 5 MG immediate release tablet Take 1 tablet (5 mg total) by mouth every 6 (six) hours as needed for  severe pain (pain score 7-10). 02/23/23  Yes Pahwani, Daleen Bo, MD  pantoprazole (PROTONIX) 40 MG tablet Take 1 tablet (40 mg total) by mouth daily. Patient taking differently: Take 40 mg by mouth daily before breakfast. 04/02/21  Yes Hongalgi, Maximino Greenland, MD  polyethylene glycol (MIRALAX / GLYCOLAX) 17 g packet Take 17 g by mouth daily.   Yes [provider]  senna-docusate (SENOKOT-S) 8.6-50 MG tablet Take 1 tablet by mouth daily.   Yes [provider]  sodium chloride 1 g tablet Take 1 g by mouth daily.   Yes [provider]  fluoruracil (CARAC) 0.5 % cream Apply 1 application  topically  daily as needed (for cancer treatment- as directed to affected areas).    [provider]  GEMTESA 75 MG TABS Take 75 mg by mouth at bedtime. 06/01/22   [provider]  methotrexate (RHEUMATREX) 2.5 MG tablet Take 20 mg by mouth every Sunday.    [provider]  nystatin (MYCOSTATIN) 100000 UNIT/ML suspension Take 5 mLs by mouth. Patient not taking: Reported on 03/17/2023 02/25/23   [provider]  rosuvastatin (CRESTOR) 20 MG tablet Take 1 tablet (20 mg total) by mouth at bedtime. 04/01/21   Elease Etienne, MD    Physical Exam: Vitals:   03/16/23 2222 03/16/23 2226 03/16/23 2300 03/17/23 0000  BP: 136/67   129/64  Pulse: 69  66 97  Resp: 17  17 14   Temp: 97.6 F (36.4 C)     TempSrc: Oral     SpO2: 100%  99% 100%  Weight:  50.8 kg    Height:  5\' 7"  (1.702 m)      Constitutional: NAD, no diaphoresis   Eyes: PERTLA, lids and conjunctivae normal ENMT: Mucous membranes are moist. Posterior pharynx clear of any exudate or lesions.   Neck: supple, no masses  Respiratory: no wheezing, no crackles. No accessory muscle use.  Cardiovascular: S1 & S2 heard, regular rate and rhythm. No extremity edema.   Abdomen: No distension, no tenderness, soft. Bowel sounds active.  Musculoskeletal: no clubbing / cyanosis. No red/hot/swollen joint.   Skin: no  significant rashes, lesions, ulcers. Warm, dry, well-perfused. Neurologic: CN 2-12 grossly intact. Moving all extremities. Alert and oriented.  Psychiatric: Calm. Cooperative.    Labs and Imaging on Admission: I have personally reviewed following labs and imaging studies  CBC: Recent Labs  Lab 03/16/23 2224  WBC 6.8  NEUTROABS 4.6  HGB 10.5*  HCT 30.2*  MCV 91.2  PLT 346   Basic Metabolic Panel: Recent Labs  Lab 03/16/23 2224  NA 120*  K 3.7  CL 87*  CO2 25  GLUCOSE 100*  BUN 8  CREATININE 0.57*  CALCIUM 8.5*   GFR: Estimated Creatinine Clearance: 47.6 mL/min (A) (by C-G formula based on SCr of 0.57 mg/dL (L)). Liver Function Tests: No results for input(s): "AST", "ALT", "ALKPHOS", "BILITOT", "PROT", "ALBUMIN" in the last 168 hours. No results for input(s): "LIPASE", "AMYLASE" in the last 168 hours. No results for input(s): "AMMONIA" in the last 168 hours. Coagulation Profile: No results for input(s): "INR", "PROTIME" in the last 168 hours. Cardiac Enzymes: No results for input(s): "CKTOTAL", "CKMB", "CKMBINDEX", "TROPONINI" in the last 168 hours. BNP (last 3 results) No results for input(s): "PROBNP" in the last 8760 hours. HbA1C: No results for input(s): "HGBA1C" in the last 72 hours. CBG: No results for input(s): "GLUCAP" in the last 168 hours. Lipid Profile: No results for input(s): "CHOL", "HDL", "LDLCALC", "TRIG", "CHOLHDL", "LDLDIRECT" in the last 72 hours. Thyroid Function Tests: No results for input(s): "TSH", "T4TOTAL", "FREET4", "T3FREE", "THYROIDAB" in the last 72 hours. Anemia Panel: No results for input(s): "VITAMINB12", "FOLATE", "FERRITIN", "TIBC", "IRON", "RETICCTPCT" in the last 72 hours. Urine analysis:    Component Value Date/Time   COLORURINE YELLOW 02/04/2023 1302   APPEARANCEUR CLEAR 02/04/2023 1302   APPEARANCEUR Hazy 04/18/2012 2324   LABSPEC 1.013 02/04/2023 1302   LABSPEC 1.018 04/18/2012 2324   PHURINE 7.0 02/04/2023 1302    GLUCOSEU NEGATIVE 02/04/2023 1302   GLUCOSEU Negative 04/18/2012 2324   HGBUR NEGATIVE 02/04/2023 1302   BILIRUBINUR NEGATIVE 02/04/2023 1302   BILIRUBINUR Negative 04/18/2012  2324   KETONESUR NEGATIVE 02/04/2023 1302   PROTEINUR NEGATIVE 02/04/2023 1302   UROBILINOGEN 0.2 04/09/2014 2138   NITRITE NEGATIVE 02/04/2023 1302   LEUKOCYTESUR NEGATIVE 02/04/2023 1302   LEUKOCYTESUR Negative 04/18/2012 2324   Sepsis Labs: @LABRCNTIP (procalcitonin:4,lacticidven:4) ) Recent Results (from the past 240 hour(s))  Resp panel by RT-PCR (RSV, Flu A&B, Covid) Anterior Nasal Swab     Status: None   Collection Time: 03/16/23 11:10 PM   Specimen: Anterior Nasal Swab  Result Value Ref Range Status   SARS Coronavirus 2 by RT PCR NEGATIVE NEGATIVE Final    Comment: (NOTE) SARS-CoV-2 target nucleic acids are NOT DETECTED.  The SARS-CoV-2 RNA is generally detectable in upper respiratory specimens during the acute phase of infection. The lowest concentration of SARS-CoV-2 viral copies this assay can detect is 138 copies/mL. A negative result does not preclude SARS-Cov-2 infection and should not be used as the sole basis for treatment or other patient management decisions. A negative result may occur with  improper specimen collection/handling, submission of specimen other than nasopharyngeal swab, presence of viral mutation(s) within the areas targeted by this assay, and inadequate number of viral copies(<138 copies/mL). A negative result must be combined with clinical observations, patient history, and epidemiological information. The expected result is Negative.  Fact Sheet for Patients:  BloggerCourse.com  Fact Sheet for Healthcare Providers:  SeriousBroker.it  This test is no t yet approved or cleared by the [place] FDA and  has been authorized for detection and/or diagnosis of SARS-CoV-2 by FDA under an Emergency Use Authorization  (EUA). This EUA will remain  in effect (meaning this test can be used) for the duration of the COVID-19 declaration under Section 564(b)(1) of the Act, 21 U.S.C.section 360bbb-3(b)(1), unless the authorization is terminated  or revoked sooner.       Influenza A by PCR NEGATIVE NEGATIVE Final   Influenza B by PCR NEGATIVE NEGATIVE Final    Comment: (NOTE) The Xpert Xpress SARS-CoV-2/FLU/RSV plus assay is intended as an aid in the diagnosis of influenza from Nasopharyngeal swab specimens and should not be used as a sole basis for treatment. Nasal washings and aspirates are unacceptable for Xpert Xpress SARS-CoV-2/FLU/RSV testing.  Fact Sheet for Patients: BloggerCourse.com  Fact Sheet for Healthcare Providers: SeriousBroker.it  This test is not yet approved or cleared by the [place] FDA and has been authorized for detection and/or diagnosis of SARS-CoV-2 by FDA under an Emergency Use Authorization (EUA). This EUA will remain in effect (meaning this test can be used) for the duration of the COVID-19 declaration under Section 564(b)(1) of the Act, 21 U.S.C. section 360bbb-3(b)(1), unless the authorization is terminated or revoked.     Resp Syncytial Virus by PCR NEGATIVE NEGATIVE Final    Comment: (NOTE) Fact Sheet for Patients: BloggerCourse.com  Fact Sheet for Healthcare Providers: SeriousBroker.it  This test is not yet approved or cleared by the [place] FDA and has been authorized for detection and/or diagnosis of SARS-CoV-2 by FDA under an Emergency Use Authorization (EUA). This EUA will remain in effect (meaning this test can be used) for the duration of the COVID-19 declaration under Section 564(b)(1) of the Act, 21 U.S.C. section 360bbb-3(b)(1), unless the authorization is terminated or revoked.  Performed at [facility], [address], [place], [place] [address]      Radiological Exams on Admission: No results found.  EKG: Independently reviewed. Sinus or ectopic atrial rhythm, RBBB.   Assessment/Plan   1. Hyponatremia  -  Serum sodium is 120; he appears hypovolemic; there are no severe symptoms  - Check urine sodium and urine osm, continue isotonic IVF hydration, restrict free water intake, follow serial sodium levels   2. Hx of CVA  - Continue Crestor, ASA, Plavix   3. Depression  - Wellbutrin    4. Ankylosing spondylitis  - Managed with methotrexate    5. Anemia  - Appears stable     DVT prophylaxis: Lovenox  Code Status: Full  Level of Care: Level of care: Telemetry Family Communication: None present  Disposition Plan:  Patient is from: SNF  Anticipated d/c is to: SNF  Anticipated d/c date is: 03/18/23  Patient currently: Pending improved sodium level  Consults called: None  Admission status: Inpatient     Briscoe Deutscher, MD [facility]  03/17/2023, 2:54 AM

## 2023-03-17 NOTE — ED Notes (Signed)
ED TO INPATIENT HANDOFF REPORT  Name/Age/Gender Steven [name] 88 y.o. male  Code Status    Code Status Orders  (From admission, onward)           Start     Ordered   03/17/23 0252  Full code  Continuous       Question:  By:  Answer:  Consent: discussion documented in EHR   03/17/23 0252           Code Status History     Date Active Date Inactive Code Status Order ID Comments User Context   02/19/2023 1609 02/24/2023 2218 Full Code [id number]  Maryln Gottron, MD ED   02/04/2023 1639 02/06/2023 1927 Full Code [id number]  Mansy, Vernetta Honey, MD ED   07/11/2022 1946 07/14/2022 1937 Full Code [id number]  Charlsie Quest, MD ED   06/04/2021 0011 06/06/2021 1907 DNR [id number]  Therisa Doyne, MD ED   03/27/2021 2141 04/01/2021 2044 Full Code [id number]  Chotiner, Claudean Severance, MD ED   02/29/2020 0144 03/02/2020 1754 Full Code [id number]  Rometta Emery, MD Inpatient   11/02/2019 0130 11/05/2019 1913 Full Code [id number]  Mansy, Vernetta Honey, MD ED   03/09/2019 0739 03/12/2019 2148 Full Code [id number]  Dimple Nanas, MD ED   11/06/2016 1539 11/08/2016 2018 Full Code [id number]  Allena Katz, PA-C Inpatient   03/21/2015 1052 03/27/2015 1554 Full Code [id number]  Nonie Hoyer, PA-C ED   04/10/2014 0647 04/12/2014 1747 Full Code [id number]  Eduard Clos, MD Inpatient       Home/SNF/Other Home  Chief Complaint Hyponatremia [E87.1]  Level of Care/Admitting Diagnosis ED Disposition     ED Disposition  Admit   Condition  --   Comment  Hospital Area: [facility] [[id number]]  Level of Care: Telemetry [5]  Admit to tele based on following criteria: Monitor QTC interval  May admit patient to [facility] if equivalent level of care is available:: Yes  Covid Evaluation: Asymptomatic - no recent exposure (last 10 days) testing not required  Diagnosis: Hyponatremia [198519]  Admitting Physician: Briscoe Deutscher [[id number]]  Attending Physician: Briscoe Deutscher [[id number]]  Certification:: I certify this patient will need inpatient services for at least 2 midnights  Expected Medical Readiness: 03/19/2023          Medical History Past Medical History:  Diagnosis Date   Allergy    enviromental   Anemia 2017   Anxiety    Arthritis    ankylosing spodilitis   Blood transfusion without reported diagnosis    during surgery   Cataract    Depression    Dyspnea    with exertion - had Echo done 09/29/16   History of kidney stones    Hyperlipidemia    Intestinal obstruction (HCC)    Pneumonia    as a child    Allergies Allergies  Allergen Reactions   Indomethacin Hives   Lactose Intolerance (Gi) Nausea And Vomiting and Other (See Comments)    GI UPSET    IV Location/Drains/Wounds Patient Lines/Drains/Airways Status     Active Line/Drains/Airways     Name Placement date Placement time Site Days   Peripheral IV 03/16/23 20 G 1.25" Right Antecubital 03/16/23  2300  Antecubital  1   Peripheral IV 03/16/23 24 G Anterior;Distal;Right Forearm 03/16/23  2345  Forearm  1            Labs/Imaging Results for orders placed or performed during  the hospital encounter of 03/16/23 (from the past 48 hour(s))  Osmolality, urine     Status: None   Collection Time: 03/16/23  5:20 AM  Result Value Ref Range   Osmolality, Ur 332 300 - 900 mOsm/kg    Comment: Performed at [facility], [address] [place], [place] [address]  Sodium, urine, random     Status: None   Collection Time: 03/16/23  5:20 AM  Result Value Ref Range   Sodium, Ur 83 mmol/L    Comment: Performed at [facility], [address] [place], [place] [address]  CBC with Differential     Status: Abnormal   Collection Time: 03/16/23 10:24 PM  Result Value Ref Range   WBC 6.8 4.0 - 10.5 K/uL   RBC 3.31 (L) 4.22 - 5.81 MIL/uL   Hemoglobin 10.5 (L) 13.0 - 17.0 g/dL   HCT 09.8 (L) 11.9 - 14.7 %   MCV 91.2 80.0 - 100.0 fL   MCH 31.7 26.0 -  34.0 pg   MCHC 34.8 30.0 - 36.0 g/dL   RDW 82.9 56.2 - 13.0 %   Platelets 346 150 - 400 K/uL   nRBC 0.0 0.0 - 0.2 %   Neutrophils Relative % 68 %   Neutro Abs 4.6 1.7 - 7.7 K/uL   Lymphocytes Relative 16 %   Lymphs Abs 1.1 0.7 - 4.0 K/uL   Monocytes Relative 9 %   Monocytes Absolute 0.6 0.1 - 1.0 K/uL   Eosinophils Relative 6 %   Eosinophils Absolute 0.4 0.0 - 0.5 K/uL   Basophils Relative 1 %   Basophils Absolute 0.1 0.0 - 0.1 K/uL   Immature Granulocytes 0 %   Abs Immature Granulocytes 0.03 0.00 - 0.07 K/uL    Comment: Performed at [facility], [address] [place], [place] [address]  Basic metabolic panel     Status: Abnormal   Collection Time: 03/16/23 10:24 PM  Result Value Ref Range   Sodium 120 (L) 135 - 145 mmol/L   Potassium 3.7 3.5 - 5.1 mmol/L   Chloride 87 (L) 98 - 111 mmol/L   CO2 25 22 - 32 mmol/L   Glucose, Bld 100 (H) 70 - 99 mg/dL    Comment: Glucose reference range applies only to samples taken after fasting for at least 8 hours.   BUN 8 8 - 23 mg/dL   Creatinine, Ser 4.69 (L) 0.61 - 1.24 mg/dL   Calcium 8.5 (L) 8.9 - 10.3 mg/dL   GFR, Estimated >62 >95 mL/min    Comment: (NOTE) Calculated using the CKD-EPI Creatinine Equation (2021)    Anion gap 8 5 - 15    Comment: Performed at [facility], [address], [place], [place] [address]  Osmolality     Status: Abnormal   Collection Time: 03/16/23 10:24 PM  Result Value Ref Range   Osmolality 266 (L) 275 - 295 mOsm/kg    Comment: Performed at [facility], [address], [place], [place] [address]  Resp panel by RT-PCR (RSV, Flu A&B, Covid) Anterior Nasal Swab     Status: None   Collection Time: 03/16/23 11:10 PM   Specimen: Anterior Nasal Swab  Result Value Ref Range   SARS Coronavirus 2 by RT PCR NEGATIVE NEGATIVE    Comment: (NOTE) SARS-CoV-2 target nucleic acids are NOT DETECTED.  The SARS-CoV-2 RNA is generally detectable in upper  respiratory specimens during the acute phase of infection. The lowest  concentration of SARS-CoV-2 viral copies this assay can detect is 138 copies/mL. A negative result does not preclude SARS-Cov-2 infection and should not be used as the sole basis for treatment or other patient management decisions. A negative result may occur with  improper specimen collection/handling, submission of specimen other than nasopharyngeal swab, presence of viral mutation(s) within the areas targeted by this assay, and inadequate number of viral copies(<138 copies/mL). A negative result must be combined with clinical observations, patient history, and epidemiological information. The expected result is Negative.  Fact Sheet for Patients:  BloggerCourse.com  Fact Sheet for Healthcare Providers:  SeriousBroker.it  This test is no t yet approved or cleared by the [place] FDA and  has been authorized for detection and/or diagnosis of SARS-CoV-2 by FDA under an Emergency Use Authorization (EUA). This EUA will remain  in effect (meaning this test can be used) for the duration of the COVID-19 declaration under Section 564(b)(1) of the Act, 21 U.S.C.section 360bbb-3(b)(1), unless the authorization is terminated  or revoked sooner.       Influenza A by PCR NEGATIVE NEGATIVE   Influenza B by PCR NEGATIVE NEGATIVE    Comment: (NOTE) The Xpert Xpress SARS-CoV-2/FLU/RSV plus assay is intended as an aid in the diagnosis of influenza from Nasopharyngeal swab specimens and should not be used as a sole basis for treatment. Nasal washings and aspirates are unacceptable for Xpert Xpress SARS-CoV-2/FLU/RSV testing.  Fact Sheet for Patients: BloggerCourse.com  Fact Sheet for Healthcare Providers: SeriousBroker.it  This test is not yet approved or cleared by the [place] FDA and has been authorized for  detection and/or diagnosis of SARS-CoV-2 by FDA under an Emergency Use Authorization (EUA). This EUA will remain in effect (meaning this test can be used) for the duration of the COVID-19 declaration under Section 564(b)(1) of the Act, 21 U.S.C. section 360bbb-3(b)(1), unless the authorization is terminated or revoked.     Resp Syncytial Virus by PCR NEGATIVE NEGATIVE    Comment: (NOTE) Fact Sheet for Patients: BloggerCourse.com  Fact Sheet for Healthcare Providers: SeriousBroker.it  This test is not yet approved or cleared by the [place] FDA and has been authorized for detection and/or diagnosis of SARS-CoV-2 by FDA under an Emergency Use Authorization (EUA). This EUA will remain in effect (meaning this test can be used) for the duration of the COVID-19 declaration under Section 564(b)(1) of the Act, 21 U.S.C. section 360bbb-3(b)(1), unless the authorization is terminated or revoked.  Performed at [facility], [address], [place], [place] [address]   Urinalysis, Routine w reflex microscopic -Urine, Clean Catch     Status: None   Collection Time: 03/17/23  5:20 AM  Result Value Ref Range   Color, Urine YELLOW YELLOW   APPearance CLEAR CLEAR   Specific Gravity, Urine 1.009 1.005 - 1.030   pH 6.0 5.0 - 8.0   Glucose, UA NEGATIVE NEGATIVE mg/dL   Hgb urine dipstick NEGATIVE NEGATIVE   Bilirubin Urine NEGATIVE NEGATIVE   Ketones, ur NEGATIVE NEGATIVE mg/dL   Protein, ur NEGATIVE NEGATIVE mg/dL   Nitrite NEGATIVE NEGATIVE   Leukocytes,Ua NEGATIVE NEGATIVE    Comment: Performed at [facility], [address] [place], [place] [address]  CBC     Status: Abnormal   Collection Time: 03/17/23  5:45 AM  Result Value Ref Range   WBC 5.5 4.0 - 10.5 K/uL   RBC 3.05 (L) 4.22 - 5.81 MIL/uL   Hemoglobin 9.8 (L) 13.0 - 17.0 g/dL  HCT 28.0 (L) 39.0 - 52.0 %   MCV 91.8 80.0 - 100.0 fL    MCH 32.1 26.0 - 34.0 pg   MCHC 35.0 30.0 - 36.0 g/dL   RDW 09.8 11.9 - 14.7 %   Platelets 300 150 - 400 K/uL   nRBC 0.0 0.0 - 0.2 %    Comment: Performed at [facility], [address] [place], [place] [address]  Basic metabolic panel     Status: Abnormal   Collection Time: 03/17/23  5:46 AM  Result Value Ref Range   Sodium 124 (L) 135 - 145 mmol/L   Potassium 3.4 (L) 3.5 - 5.1 mmol/L   Chloride 93 (L) 98 - 111 mmol/L   CO2 23 22 - 32 mmol/L   Glucose, Bld 79 70 - 99 mg/dL    Comment: Glucose reference range applies only to samples taken after fasting for at least 8 hours.   BUN 7 (L) 8 - 23 mg/dL   Creatinine, Ser 2.13 (L) 0.61 - 1.24 mg/dL   Calcium 8.2 (L) 8.9 - 10.3 mg/dL   GFR, Estimated >08 >65 mL/min    Comment: (NOTE) Calculated using the CKD-EPI Creatinine Equation (2021)    Anion gap 8 5 - 15    Comment: Performed at [facility], [address], [place], [place] [address]  Basic metabolic panel     Status: Abnormal   Collection Time: 03/17/23 10:20 AM  Result Value Ref Range   Sodium 125 (L) 135 - 145 mmol/L   Potassium 3.6 3.5 - 5.1 mmol/L   Chloride 94 (L) 98 - 111 mmol/L   CO2 24 22 - 32 mmol/L   Glucose, Bld 85 70 - 99 mg/dL    Comment: Glucose reference range applies only to samples taken after fasting for at least 8 hours.   BUN 6 (L) 8 - 23 mg/dL   Creatinine, Ser 6.29 (L) 0.61 - 1.24 mg/dL   Calcium 7.9 (L) 8.9 - 10.3 mg/dL   GFR, Estimated >52 >84 mL/min    Comment: (NOTE) Calculated using the CKD-EPI Creatinine Equation (2021)    Anion gap 7 5 - 15    Comment: Performed at [facility], [address] [place], [place] [address]   No results found.  Pending Labs Unresulted Labs (From admission, onward)     Start     Ordered   03/24/23 0500  Creatinine, serum  (enoxaparin (LOVENOX)    CrCl >/= 30 ml/min)  Weekly,   R     Comments: while on enoxaparin therapy    03/17/23 0252   03/17/23  0500  CBC  Daily,   R      03/17/23 0252   03/17/23 0252  Basic metabolic panel  Now then every 6 hours,   R (with TIMED occurrences)      03/17/23 0252            Vitals/Pain Today's Vitals   03/17/23 1145 03/17/23 1215 03/17/23 1230 03/17/23 1312  BP: (!) 125/59 (!) 140/59 (!) 141/64   Pulse: 65     Resp: 14 18 13    Temp:    98 F (36.7 C)  TempSrc:    Axillary  SpO2: 99%  99%   Weight:      Height:      PainSc:        Isolation Precautions No active isolations  Medications Medications  0.9 %  sodium chloride infusion (0 mLs Intravenous Stopped  03/17/23 0937)  aspirin EC tablet 325 mg (325 mg Oral Given 03/17/23 0945)  rosuvastatin (CRESTOR) tablet 20 mg (has no administration in time range)  buPROPion (WELLBUTRIN XL) 24 hr tablet 150 mg (150 mg Oral Given 03/17/23 0945)  pantoprazole (PROTONIX) EC tablet 40 mg (40 mg Oral Given 03/17/23 0814)  mirabegron ER (MYRBETRIQ) tablet 25 mg (25 mg Oral Given 03/17/23 0945)  clopidogrel (PLAVIX) tablet 75 mg (has no administration in time range)  melatonin tablet 5 mg (has no administration in time range)  enoxaparin (LOVENOX) injection 40 mg (40 mg Subcutaneous Given 03/17/23 0945)  sodium chloride flush (NS) 0.9 % injection 3 mL (3 mLs Intravenous Given 03/17/23 0936)  acetaminophen (TYLENOL) tablet 650 mg (has no administration in time range)    Or  acetaminophen (TYLENOL) suppository 650 mg (has no administration in time range)  oxyCODONE (Oxy IR/ROXICODONE) immediate release tablet 5 mg (has no administration in time range)  polyethylene glycol (MIRALAX / GLYCOLAX) packet 17 g (has no administration in time range)  bisacodyl (DULCOLAX) EC tablet 5 mg (has no administration in time range)  0.9 %  sodium chloride infusion ( Intravenous New Bag/Given 03/17/23 0936)  sodium chloride 0.9 % bolus 500 mL (0 mLs Intravenous Stopped 03/17/23 0320)  potassium chloride SA (KLOR-CON M) CR tablet 20 mEq (20 mEq Oral Given 03/17/23 0945)     Mobility walks with device

## 2023-03-18 DIAGNOSIS — E871 Hypo-osmolality and hyponatremia: Secondary | ICD-10-CM | POA: Diagnosis not present

## 2023-03-18 LAB — BASIC METABOLIC PANEL
Anion gap: 6 (ref 5–15)
BUN: 9 mg/dL (ref 8–23)
CO2: 26 mmol/L (ref 22–32)
Calcium: 8.4 mg/dL — ABNORMAL LOW (ref 8.9–10.3)
Chloride: 96 mmol/L — ABNORMAL LOW (ref 98–111)
Creatinine, Ser: 0.54 mg/dL — ABNORMAL LOW (ref 0.61–1.24)
GFR, Estimated: >60 mL/min (ref >60)
Glucose, Bld: 90 mg/dL (ref 70–99)
Potassium: 3.9 mmol/L (ref 3.5–5.1)
Sodium: 128 mmol/L — ABNORMAL LOW (ref 135–145)

## 2023-03-18 LAB — OSMOLALITY, URINE: Osmolality, Ur: 313 mosm/kg (ref 300–900)

## 2023-03-18 MED ORDER — SODIUM CHLORIDE 1 G PO TABS
1.0000 g | ORAL_TABLET | Freq: Two times a day (BID) | ORAL | Status: DC
  Administered 2023-03-18 – 2023-03-19 (×3): 1 g via ORAL
  Filled 2023-03-18 (×3): qty 1

## 2023-03-18 NOTE — Consult Note (Signed)
[facility] Liaison Consult Note    03/18/2023  Steven [name] 04/22/1934 [id number]  Insurance: [employer]   Primary Care Provider: [name], MD with [facility] is listed and this provider office is listed for the transition of care follow up appointments  and [facility] calls   [facility] Liaison screened the patient remotely at [facility].    The patient was screened for less than 30 day readmission hospitalization with noted high risk score for unplanned readmission risk with 2 ED visits and 3 hospital admissions in 6 months.  The patient was assessed for potential Community Care Coordination service needs for post hospital transition for care coordination. Review of patient's electronic medical record reveals patient was recently from [facility] and was followed by [facility] RN as well.  Plan: [facility] Liaison will continue to follow progress and disposition to asess for post hospital community care coordination needs with review of inpatient [facility] team assessments.  Referral request for community care coordination: pending disposition needs. If patient returns to an affiliated SNF will follow up with [facility] RN. However, if the patient returns home with [facility], then [facility] team can follow up as well.   [facility] does not replace or interfere with any arrangements made by the Inpatient Transition of Care team.   For questions contact:   Charlesetta Shanks, RN, BSN, CCM [place]  [facility] Liaison Direct Dial: [phone] or secure chat Email: [email or web address]

## 2023-03-18 NOTE — Progress Notes (Signed)
[facility] Quality rating ???? Patient survey rating ????   [facility] Quality rating ????? Patient survey rating???   [facility] [phone] Quality rating???? Patient survey rating????   [facility] [phone] Quality rating???? Patient survey rating????   [facility] [phone] Quality rating ???? Patient survey rating???   [facility] Quality rating??? Patient survey rating???   [facility] [phone] Quality rating??? Patient survey rating ????   [facility] [phone] Quality rating ????   [facility] Quality rating ??? Patient survey rating ????   [facility] Quality rating ??? Patient survey rating???   [facility] Quality rating ????? Patient survey rating ???   [facility] [phone] Quality rating ????? Patient survey rating ????

## 2023-03-18 NOTE — Progress Notes (Signed)
[facility] PROGRESS NOTE  Steven [name] (DOB: 1935-02-19) UVO:[id number] PCP: Steven Brunette, MD  Brief Narrative: Steven [name] is an 88 y.o. male with a history of ankylosing spondylitis, history of CVA, GERD, s/p right THA complicated by dislocation who presented to the ED from SNF on 03/16/2023 due to low sodium level found on labs. He reported no change in chronic fatigue, imbalance/dizziness and no change in oral intake of late. He had stable vital signs. Labs are most notable for sodium 120, normal creatinine, normal WBC. He was given 500 mL of normal saline in the ED and admitted. Fluid restriction was implemented with some improvement in sodium level. Salt tabs are added.   Subjective: No new complaints, felt good with therapy who recommended returning home.   Objective: BP (!) 101/57 (BP Location: Left Arm)   Pulse 72   Temp 97.9 F (36.6 C) (Oral)   Resp 18   Ht 5\' 7"  (1.702 m)   Wt 54.7 kg   SpO2 100%   BMI 18.89 kg/m   Gen: No distress, elderly and pleasant Pulm: Clear, nonlabored  CV: RRR, no edema GI: Soft, NT, ND, +BS  Neuro: Alert and oriented. No new focal deficits. Ext: Warm, no deformities. Brace in place still. Skin: No new rashes, lesions or ulcers on visualized skin   Assessment & Plan: Hyponatremia: Suspect SIADH. - Continue to monitor BMP. Na 120 > 128 over 32 hours thus far, improving at goal rate.   - Regular diet w/fluid restriction, 1,200cc/day. Start salt tabs. UNa remains inappropriately elevated but Na level is asymptomatic.    History of CVA:  - Continue ASA, plavix, rosuvastatin  Ankylosing spondylitis:  - Continue MTX  Hypokalemia:  - Supplemented and resolved.  Iron deficiency anemia: Chronic, stable, well managed based on normocytic indices.  Hgb 9.8 g/dl  s/p right THA and hip dislocations:  - Continue brace for now - Discuss with orthopedics Re: outpatient f/u, brace removal.   Steven Nine, MD [facility] [email or web address] 03/18/2023, 4:57 PM

## 2023-03-18 NOTE — TOC Initial Note (Addendum)
Transition of Care [facility]) - Initial/Assessment Note    Patient Details  Name: Steven [name] MRN: [id number] Date of Birth: 03-18-1935  Transition of Care [facility]) CM/SW Contact:    Otelia Santee, LCSW Phone Number: 03/18/2023, 1:16 PM  Clinical Narrative:                 Pt coming from [facility] where he was staying for ST SNF. Pt now recommended to return home with home health. Met with pt and spouse at bedside to discuss recommendation. Pt and family confirm plan for home health PT/OT and confirm need for wheelchair with cushion. When asked for HHA preference pt's spouse reported they would like services through [facility]. CSW explained that [facility] provided palliative care and hospice services but, not home health. List of HHA provided to pt and spouse to review prior to making selection for HHA. CSW informed pt and spouse that due to pt's insurance it will not be guaranteed that preferred HHA will be able to accept. CSW awaiting HHA choice.  Wheelchair w/ wheelchair cushion has been ordered through [employer]. Awaiting orders to be placed for wheelchair by MD.   ADDENDUM: CSW informed by Glenna Fellows with [facility] that pt has been enrolled into their home based primary care services. As pt will be receiving home based primary care [facility] will also be able to provide HHPT/OT/RN. Confirmed with pt's spouse that she is agreeable to this.   Expected Discharge Plan: Home w Home Health Services Barriers to Discharge: No Barriers Identified   Patient Goals and CMS Choice Patient states their goals for this hospitalization and ongoing recovery are:: To return home CMS Medicare.gov Compare Post Acute Care list provided to:: Patient Choice offered to / list presented to : Patient, Spouse [place] ownership interest in [facility].provided to::  (NA)    Expected Discharge Plan and Services In-house Referral: Clinical Social Work Discharge Planning Services: NA Post Acute Care  Choice: Home Health, Durable Medical Equipment Living arrangements for the past 2 months: Single Family Home                 DME Arranged: Government social research officer Tax inspector) DME Agency: [employer] Date DME Agency Contacted: 03/18/23 Time DME Agency Contacted: [id number] Representative spoke with at DME Agency: Zack            Prior Living Arrangements/Services Living arrangements for the past 2 months: Single Family Home Lives with:: Self, Spouse Patient language and need for interpreter reviewed:: Yes Do you feel safe going back to the place where you live?: Yes      Need for Family Participation in Patient Care: No (Comment) Care giver support system in place?: No (comment) Current home services: DME (RW) Criminal Activity/Legal Involvement Pertinent to Current Situation/Hospitalization: No - Comment as needed  Activities of Daily Living   ADL Screening (condition at time of admission) Independently performs ADLs?: Yes (appropriate for developmental age) Is the patient deaf or have difficulty hearing?: Yes Does the patient have difficulty seeing, even when wearing glasses/contacts?: No Does the patient have difficulty concentrating, remembering, or making decisions?: No  Permission Sought/Granted Permission sought to share information with : Facility Medical sales representative, Family Supports Permission granted to share information with : Yes, Verbal Permission Granted  Share Information with NAME: Mickel Duhamel  Permission granted to share info w AGENCY: HHA  Permission granted to share info w Relationship: Spouse     Emotional Assessment Appearance:: Appears stated age Attitude/Demeanor/Rapport: Engaged Affect (typically  observed): Accepting Orientation: : Oriented to Self, Oriented to  Time, Oriented to Place, Oriented to Situation Alcohol / Substance Use: Not Applicable Psych Involvement: No (comment)  Admission diagnosis:  Hyponatremia [E87.1] Patient Active Problem  List   Diagnosis Date Noted   Hyponatremia 03/16/2023   Closed dislocation of right hip (HCC) 02/24/2023   Closed traumatic minimally displaced fracture of one rib of left side 02/24/2023   Sepsis due to pneumonia (HCC) 02/04/2023   Dyslipidemia 02/04/2023   GERD without esophagitis 02/04/2023   Community acquired pneumonia of right lung 02/04/2023   Gastroenteritis due to norovirus 07/11/2022   History of CVA (cerebrovascular accident) 07/11/2022   Iron deficiency anemia due to chronic blood loss 09/25/2021   Elevated CK 06/05/2021   Diarrhea 06/05/2021   Orthostatic hypotension 06/04/2021   Generalized weakness 06/04/2021   Fall at home, initial encounter 06/04/2021   Dehydration 06/03/2021   Alcohol use 06/03/2021   CVA (cerebral vascular accident) (HCC) 03/28/2021   Stenosis of left carotid artery    TIA (transient ischemic attack) 03/27/2021   Small bowel obstruction due to adhesions (HCC) 02/29/2020   Hypokalemia    Hypomagnesemia    Ankylosing spondylitis (HCC)    Major depressive disorder    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 - [place], [place] - [address] [place] [address] Phone: [phone] Fax: [phone]     Social Determinants of Health (SDOH) Social History: SDOH Screenings   Food Insecurity: No Food Insecurity (03/17/2023)  Housing: Low Risk  (03/17/2023)  Transportation Needs: No Transportation Needs (03/17/2023)  Utilities: Not At Risk (03/17/2023)  Financial Resource Strain: Low Risk  (03/09/2019)  Physical Activity: Insufficiently Active (03/09/2019)  Social Connections: Unknown (03/09/2019)  Stress: No Stress Concern Present (03/09/2019)  Tobacco Use: Medium Risk (03/17/2023)   SDOH Interventions:     Readmission Risk  Interventions    03/18/2023    1:13 PM 02/23/2023   12:29 PM  Readmission Risk Prevention Plan  Transportation Screening Complete Complete  PCP or Specialist Appt within 3-5 Days Complete Complete  HRI or Home Care Consult Complete Complete  Social Work Consult for Recovery Care Planning/Counseling Complete Complete  Palliative Care Screening Not Applicable Not Applicable  Medication Review Oceanographer) Complete Complete

## 2023-03-18 NOTE — Plan of Care (Signed)
  Problem: Education: Goal: Knowledge of General Education information will improve Description: Including pain rating scale, medication(s)/side effects and non-pharmacologic comfort measures Outcome: Progressing   Problem: Clinical Measurements: Goal: Ability to maintain clinical measurements within normal limits will improve Outcome: Progressing Goal: Will remain free from infection Outcome: Progressing   Problem: Nutrition: Goal: Adequate nutrition will be maintained Outcome: Progressing   Problem: Pain Management: Goal: General experience of comfort will improve Outcome: Progressing   Problem: Safety: Goal: Ability to remain free from injury will improve Outcome: Progressing   Problem: Skin Integrity: Goal: Risk for impaired skin integrity will decrease Outcome: Progressing

## 2023-03-18 NOTE — Evaluation (Signed)
Physical Therapy Evaluation Patient Details Name: Steven [name] MRN: [id number] DOB: [birth year] Today's Date: 03/18/2023  History of Present Illness  Pt is an 88 y.o. male presenting for evaluation of hyponatremia. Past medical history significant for ankylosing spondylitis, history of CVA, GERD, history of total right hip arthroplasty with spontaneous dislocations last month, and HLD.   Clinical Impression  Pt is an 88 y.o. male with above HPI resulting in the deficits listed below (see PT Problem List). Pt with hospitalization and d/c to short term rehab at [facility] -admitted from SNF. Pt reports independence prior. Pt performed sit to stand transfers with MIN A and ambulated total of ~8ft with MIN A and no overt LOB observed. Pt reports he lives with spouse at home who can provide assist and prefers to d/c back home with family assist and [facility] therapy services. Pt will benefit from skilled PT to maximize functional mobility to increase independence during acute stay with follow up HHPT.        If plan is discharge home, recommend the following: A little help with walking and/or transfers;Help with stairs or ramp for entrance;Assist for transportation;Assistance with cooking/housework;A lot of help with bathing/dressing/bathroom   Can travel by private vehicle        Equipment Recommendations    Recommendations for Other Services       Functional Status Assessment Patient has had a recent decline in their functional status and demonstrates the ability to make significant improvements in function in a reasonable and predictable amount of time.     Precautions / Restrictions Precautions Precautions: Fall;Posterior Hip Precaution Comments: Pt able to recall post hip precatiuons, required cuing for no bending past 90deg Other Brace: hip abd brace Restrictions Weight Bearing Restrictions: No RLE Weight Bearing: Weight bearing as tolerated Other Position/Activity Restrictions: NO WB  restrictions in orders upon eval per last hospitilization pt able to WBAT  with use of brace      Mobility  Bed Mobility Overal bed mobility: Needs Assistance Bed Mobility: Supine to Sit       Sit to supine: Mod assist   General bed mobility comments: bring trunk to upright    Transfers Overall transfer level: Needs assistance Equipment used: Rolling walker (2 wheels) Transfers: Sit to/from Stand Sit to Stand: Min assist                Ambulation/Gait Ambulation/Gait assistance: Min assist Gait Distance (Feet): 50 Feet Assistive device: Rolling walker (2 wheels) Gait Pattern/deviations: Step-to pattern, Decreased stride length, Trunk flexed Gait velocity: decreased     General Gait Details: no overt LOB observed. MIN A progressed to CGA. Reports some dizziness with mobility, but reports this is baseline and no changes.  Stairs            Wheelchair Mobility     Tilt Bed    Modified Rankin (Stroke Patients Only)       Balance Overall balance assessment: Needs assistance, History of Falls Sitting-balance support: Feet supported, Bilateral upper extremity supported Sitting balance-[name]: Fair     Standing balance support: Reliant on assistive device for balance, During functional activity Standing balance-[name]: Poor Standing balance comment: reliant on RW                             Pertinent Vitals/Pain Pain Assessment Pain Assessment: No/denies pain    Home Living Family/patient expects to be discharged to:: Skilled nursing facility Living Arrangements:  (  [facility] for short term rehab)                 Additional Comments: Was living at home with wife prior to d/c to SNF. Independent prior. 1 level home. 5 steps to enter with B railings.    Prior Function Prior Level of Function : Independent/Modified Independent;Driving (prior to hospitilization with d/c to SNF)             Mobility Comments: supervision  from staff. reports he got up to ambulating ~10-15yards with PT at SNF. ADLs Comments: assist from staff     Extremity/Trunk Assessment   Upper Extremity Assessment Upper Extremity Assessment: Defer to OT evaluation    Lower Extremity Assessment Lower Extremity Assessment: Generalized weakness       Communication   Communication Communication: Hearing impairment  Cognition Arousal: Alert Behavior During Therapy: WFL for tasks assessed/performed Overall Cognitive Status: Within Functional Limits for tasks assessed                                          General Comments General comments (skin integrity, edema, etc.): BP in supine at beginnnig of session: 116/68, 74bpm   following ambulation: 124/66, 79bpm    Exercises     Assessment/Plan    PT Assessment Patient needs continued PT services  PT Problem List Decreased strength;Decreased range of motion;Decreased activity tolerance;Decreased balance;Decreased mobility       PT Treatment Interventions DME instruction;Gait training;Stair training;Functional mobility training;Therapeutic activities;Therapeutic exercise;Balance training;Patient/family education    PT Goals (Current goals can be found in the Care Plan section)  Acute Rehab PT Goals Patient Stated Goal: Go back home PT Goal Formulation: With patient Time For Goal Achievement: 04/01/23 Potential to Achieve Goals: Good    Frequency Min 1X/week     Co-evaluation               AM-PAC PT "6 Clicks" Mobility  Outcome Measure Help needed turning from your back to your side while in a flat bed without using bedrails?: A Little Help needed moving from lying on your back to sitting on the side of a flat bed without using bedrails?: A Little Help needed moving to and from a bed to a chair (including a wheelchair)?: A Little Help needed standing up from a chair using your arms (e.g., wheelchair or bedside chair)?: A Little Help needed to  walk in hospital room?: A Little Help needed climbing 3-5 steps with a railing? : A Lot 6 Click Score: 17    End of Session Equipment Utilized During Treatment: Gait belt;Other (comment) (hip abd brace) Activity Tolerance: Patient tolerated treatment well Patient left: in chair;with call bell/phone within reach;with chair alarm set (head reclined in chair to asisst wiht adhering to post hip precautions) Nurse Communication: Mobility status;Other (comment) PT Visit Diagnosis: Unsteadiness on feet (R26.81);Other abnormalities of gait and mobility (R26.89);Repeated falls (R29.6);Muscle weakness (generalized) (M62.81);Difficulty in walking, not elsewhere classified (R26.2)    Time: 0912-0940 PT Time Calculation (min) (ACUTE ONLY): 28 min   Charges:   PT Evaluation $PT Eval Low Complexity: 1 Low PT Treatments $Therapeutic Activity: 8-22 mins PT General Charges $$ ACUTE PT VISIT: 1 Visit        Lyman Speller PT, DPT  Acute Rehabilitation Services  Office [phone]   03/18/2023, 11:33 AM

## 2023-03-18 NOTE — Plan of Care (Signed)

## 2023-03-18 NOTE — Evaluation (Signed)
Occupational Therapy Evaluation Patient Details Name: Steven [name] MRN: [id number] DOB: 10-25-1934 Today's Date: 03/18/2023   History of Present Illness Pt is an 88 y.o. male presenting for evaluation of hyponatremia. Past medical history significant for ankylosing spondylitis, history of CVA, GERD, history of total right hip arthroplasty with spontaneous dislocations last month, and HLD.   Clinical Impression   Pt typically lives with his wife who assists him with LB ADLs and IADLs. He has been at [facility] for rehab and ambulating with assist and a RW. Pt presents with generalized weakness and impaired standing balance. He stood from elevated chair with CGA and ambulated with min assist. Pt has been educated in AE for LB ADLs at SNF, but plans to rely on his wife as he does at baseline. Pt has all necessary DME, requests a manual w/c, case manager notified. Recommending HHOT upon discharge, pt in agreement.       If plan is discharge home, recommend the following: A little help with walking and/or transfers;A lot of help with bathing/dressing/bathroom;Assistance with cooking/housework;Assist for transportation;Help with stairs or ramp for entrance    Functional Status Assessment  Patient has had a recent decline in their functional status and demonstrates the ability to make significant improvements in function in a reasonable and predictable amount of time.  Equipment Recommendations  Wheelchair (measurements OT);Wheelchair cushion (measurements OT)    Recommendations for Other Services       Precautions / Restrictions Precautions Precautions: Fall;Posterior Hip Precaution Comments: Pt able to recall post hip precatiuons, required cuing for no bending past 90deg Required Braces or Orthoses: Other Brace Other Brace: hip abd brace, removes in bed Restrictions Weight Bearing Restrictions: No RLE Weight Bearing: Weight bearing as tolerated Other Position/Activity Restrictions: NO WB  restrictions in orders upon eval per last hospitilization pt able to WBAT  with use of brace      Mobility Bed Mobility               General bed mobility comments: in chair    Transfers Overall transfer level: Needs assistance Equipment used: Rolling walker (2 wheels) Transfers: Sit to/from Stand Sit to Stand: Contact guard assist           General transfer comment: from recliner      Balance Overall balance assessment: Needs assistance, History of Falls Sitting-balance support: Feet supported, Bilateral upper extremity supported Sitting balance-[name]: Fair     Standing balance support: Reliant on assistive device for balance, During functional activity Standing balance-[name]: Poor Standing balance comment: reliant on RW                           ADL either performed or assessed with clinical judgement   ADL Overall ADL's : Needs assistance/impaired Eating/Feeding: Independent;Sitting   Grooming: Oral care;Sitting;Set up   Upper Body Bathing: Minimal assistance;Sitting   Lower Body Bathing: Maximal assistance;Sit to/from stand   Upper Body Dressing : Minimal assistance;Sitting   Lower Body Dressing: Maximal assistance;Sit to/from stand   Toilet Transfer: Minimal assistance;Rolling walker (2 wheels)           Functional mobility during ADLs: Minimal assistance;Rolling walker (2 wheels)       Vision Baseline Vision/History: 1 Wears glasses Patient Visual Report: No change from baseline       Perception         Praxis         Pertinent Vitals/Pain Pain Assessment Pain Assessment:  No/denies pain     Extremity/Trunk Assessment Upper Extremity Assessment Upper Extremity Assessment: Overall WFL for tasks assessed   Lower Extremity Assessment Lower Extremity Assessment: Defer to PT evaluation   Cervical / Trunk Assessment Cervical / Trunk Assessment: Other exceptions (AS, forward head)   Communication  Communication Communication: Hearing impairment   Cognition Arousal: Alert Behavior During Therapy: WFL for tasks assessed/performed Overall Cognitive Status: Within Functional Limits for tasks assessed                                 General Comments: HOH     General Comments  BP in supine at beginnnig of session: 116/68, 74bpm   following ambulation: 124/66, 79bpm    Exercises     Shoulder Instructions      Home Living Family/patient expects to be discharged to:: Private residence Living Arrangements: Spouse/significant other Available Help at Discharge: Family;Available 24 hours/day Type of Home: House Home Access: Stairs to enter [employer] of Steps: 3 Entrance Stairs-Rails: Right;Left;Can reach both Home Layout: Two level;Able to live on main level with bedroom/bathroom     Bathroom Shower/Tub: Arts development officer Toilet: Handicapped height     Home Equipment: Agricultural consultant (2 wheels);Cane - single point;Shower seat;Grab bars - tub/shower;BSC/3in1;Hand held shower head   Additional Comments: Was living at home with wife prior to d/c to SNF. Independent prior. 1 level home. 5 steps to enter with B railings.      Prior Functioning/Environment Prior Level of Function : Needs assist             Mobility Comments: walking with RW and assist at SNF ADLs Comments: longstanding dependence in LB ADLs due to his AS        OT Problem List: Decreased activity tolerance;Impaired balance (sitting and/or standing);Decreased coordination;Decreased safety awareness;Decreased knowledge of precautions;Decreased knowledge of use of DME or AE      OT Treatment/Interventions: Self-care/ADL training;Therapeutic exercise;DME and/or AE instruction;Therapeutic activities;Patient/family education;Balance training    OT Goals(Current goals can be found in the care plan section) Acute Rehab OT Goals OT Goal Formulation: With patient Time For Goal  Achievement: 04/01/23 Potential to Achieve Goals: Good ADL Goals Pt Will Perform Grooming: with supervision;standing Pt Will Transfer to Toilet: with supervision;ambulating;bedside commode Pt Will Perform Toileting - Clothing Manipulation and hygiene: sit to/from stand;with supervision Pt Will Perform Tub/Shower Transfer: with min assist;ambulating;shower seat;Shower transfer Additional ADL Goal #1: Pt will adhere to posterior hip precautions during ADLs and mobility.  OT Frequency: Min 1X/week    Co-evaluation              AM-PAC OT "6 Clicks" Daily Activity     Outcome Measure Help from another person eating meals?: None Help from another person taking care of personal grooming?: A Little Help from another person toileting, which includes using toliet, bedpan, or urinal?: A Lot Help from another person bathing (including washing, rinsing, drying)?: A Lot Help from another person to put on and taking off regular upper body clothing?: A Little Help from another person to put on and taking off regular lower body clothing?: Total 6 Click Score: 15   End of Session Equipment Utilized During Treatment: Rolling walker (2 wheels);Gait belt;Other (comment) (hip brace) Nurse Communication: Mobility status;Other (comment) (recommendation for home)  Activity Tolerance: Patient tolerated treatment well Patient left: in chair;with call bell/phone within reach;with chair alarm set  OT Visit Diagnosis: Unsteadiness on  feet (R26.81);Other abnormalities of gait and mobility (R26.89);Muscle weakness (generalized) (M62.81)                Time: 1610-9604 OT Time Calculation (min): 35 min Charges:  OT General Charges $OT Visit: 1 Visit OT Evaluation $OT Eval Moderate Complexity: 1 Mod OT Treatments $Self Care/Home Management : 8-22 mins  Berna Spare, OTR/L Acute Rehabilitation Services Office: [phone]   [name] 03/18/2023, 11:50 AM

## 2023-03-19 DIAGNOSIS — E871 Hypo-osmolality and hyponatremia: Secondary | ICD-10-CM | POA: Diagnosis not present

## 2023-03-19 LAB — BASIC METABOLIC PANEL
Anion gap: 6 (ref 5–15)
BUN: 8 mg/dL (ref 8–23)
CO2: 25 mmol/L (ref 22–32)
Calcium: 8.4 mg/dL — ABNORMAL LOW (ref 8.9–10.3)
Chloride: 96 mmol/L — ABNORMAL LOW (ref 98–111)
Creatinine, Ser: 0.57 mg/dL — ABNORMAL LOW (ref 0.61–1.24)
GFR, Estimated: >60 mL/min (ref >60)
Glucose, Bld: 90 mg/dL (ref 70–99)
Potassium: 3.9 mmol/L (ref 3.5–5.1)
Sodium: 127 mmol/L — ABNORMAL LOW (ref 135–145)

## 2023-03-19 MED ORDER — SODIUM CHLORIDE 1 G PO TABS
2.0000 g | ORAL_TABLET | Freq: Two times a day (BID) | ORAL | Status: DC
  Administered 2023-03-19 – 2023-03-20 (×2): 2 g via ORAL
  Filled 2023-03-19 (×2): qty 2

## 2023-03-19 NOTE — Plan of Care (Signed)

## 2023-03-19 NOTE — Plan of Care (Signed)

## 2023-03-19 NOTE — Progress Notes (Signed)
Occupational Therapy Treatment Patient Details Name: Steven [name] MRN: [id number] DOB: Feb 13, 1935 Today's Date: 03/19/2023   History of present illness Pt is an 88 y.o. male presenting for evaluation of hyponatremia. Past medical history significant for ankylosing spondylitis, history of CVA, GERD, history of total right hip arthroplasty with spontaneous dislocations last month, and HLD.   OT comments  Pt and wife have decided pt needs to return to [facility] for further rehab until wife can hire assistance in the home. Pt completed bed mobility with min assist to raise trunk, stood from elevated bed with CGA, ambulated to sink for standing grooming with min assist and RW. Remained up in chair at end of session with all needs met. Patient will benefit from continued inpatient follow up therapy, <3 hours/day.       If plan is discharge home, recommend the following:  A little help with walking and/or transfers;A lot of help with bathing/dressing/bathroom;Assistance with cooking/housework;Assist for transportation;Help with stairs or ramp for entrance   Equipment Recommendations  Wheelchair (measurements OT);Wheelchair cushion (measurements OT)    Recommendations for Other Services      Precautions / Restrictions Precautions Precautions: Fall;Posterior Hip Required Braces or Orthoses: Other Brace Restrictions Weight Bearing Restrictions: No RLE Weight Bearing: Weight bearing as tolerated       Mobility Bed Mobility Overal bed mobility: Needs Assistance Bed Mobility: Supine to Sit     Supine to sit: Min assist, HOB elevated     General bed mobility comments: assist to raise trunk    Transfers Overall transfer level: Needs assistance Equipment used: Rolling walker (2 wheels) Transfers: Sit to/from Stand Sit to Stand: Contact guard assist           General transfer comment: from elevated bed     Balance Overall balance assessment: Needs assistance, History of  Falls   Sitting balance-[name]: Fair       Standing balance-[name]: Poor Standing balance comment: reliant on RW                           ADL either performed or assessed with clinical judgement   ADL Overall ADL's : Needs assistance/impaired     Grooming: Oral care;Standing;Contact guard assist                   Toilet Transfer: Minimal assistance;Rolling walker (2 wheels);Ambulation           Functional mobility during ADLs: Minimal assistance;Rolling walker (2 wheels)      Extremity/Trunk Assessment              Vision       Perception     Praxis      Cognition Arousal: Alert Behavior During Therapy: WFL for tasks assessed/performed Overall Cognitive Status: Within Functional Limits for tasks assessed                                 General Comments: [facility]        Exercises      Shoulder Instructions       General Comments      Pertinent Vitals/ Pain       Pain Assessment Pain Assessment: No/denies pain  Home Living  Prior Functioning/Environment              Frequency  Min 1X/week        Progress Toward Goals  OT Goals(current goals can now be found in the care plan section)  Progress towards OT goals: Progressing toward goals  Acute Rehab OT Goals OT Goal Formulation: With patient Time For Goal Achievement: 04/01/23 Potential to Achieve Goals: Good  Plan      Co-evaluation                 AM-PAC OT "6 Clicks" Daily Activity     Outcome Measure   Help from another person eating meals?: None Help from another person taking care of personal grooming?: A Little Help from another person toileting, which includes using toliet, bedpan, or urinal?: A Lot Help from another person bathing (including washing, rinsing, drying)?: A Lot Help from another person to put on and taking off regular upper body clothing?: A  Little Help from another person to put on and taking off regular lower body clothing?: Total 6 Click Score: 15    End of Session Equipment Utilized During Treatment: Gait belt;Rolling walker (2 wheels)  OT Visit Diagnosis: Unsteadiness on feet (R26.81);Other abnormalities of gait and mobility (R26.89);Muscle weakness (generalized) (M62.81)   Activity Tolerance Patient tolerated treatment well   Patient Left in chair;with call bell/phone within reach;with chair alarm set   Nurse Communication          Time: 1610-9604 OT Time Calculation (min): 34 min  Charges: OT General Charges $OT Visit: 1 Visit OT Treatments $Self Care/Home Management : 23-37 mins  Berna Spare, OTR/L Acute Rehabilitation Services Office: [phone]  [name] 03/19/2023, 11:24 AM

## 2023-03-19 NOTE — Progress Notes (Signed)
[facility] PROGRESS NOTE  Steven [name] (DOB: [birth year]) WUJ:[id number] PCP: [name], MD  Brief Narrative: Steven [name] is an 88 y.o. male with a history of ankylosing spondylitis, history of CVA, GERD, s/p right THA complicated by dislocation who presented to the ED from SNF on 03/16/2023 due to low sodium level found on labs. He reported no change in chronic fatigue, imbalance/dizziness and no change in oral intake of late. He had stable vital signs. Labs are most notable for sodium 120, normal creatinine, normal WBC. He was given 500 mL of normal saline in the ED and admitted. Fluid restriction was implemented with some improvement in sodium level. Salt tabs are added.   Subjective: Feels well, would like to go home but can't get adequate assistance at home at this time. Wife can not care for him at his current level of needs. Amenable to returning to SNF  Objective: BP 104/62 (BP Location: Right Arm)   Pulse 74   Temp 97.6 F (36.4 C) (Oral)   Resp 18   Ht 5\' 7"  (1.702 m)   Wt 54.7 kg   SpO2 100%   BMI 18.89 kg/m   Gen: Pleasant, elderly, active male in no distress Pulm: Clear, nonlabored CV: No edema GI: Soft, NT, ND, +BS  Neuro: Alert and oriented. No new focal deficits.   Assessment & Plan: Hyponatremia: Suspect SIADH. - Continue to monitor BMP. Na 120 > 128 over 32 hours thus far, improving at goal rate. Stalled at 127, but feels well.  - Regular diet w/fluid restriction, 1,200cc/day. Increase salt tabs to 2g BID, potentially could increase further depending on trends. He remains asymptomatic  and likely has low baseline. D/w nephrology who agrees with management, with discharge and close monitoring, further recommends adequate protein/solute intake.   History of CVA:  - Continue ASA, plavix, rosuvastatin  Ankylosing spondylitis:  - Continue MTX  Hypokalemia:  - Supplemented and resolved.  Iron deficiency anemia: Chronic, stable, well managed based on  normocytic indices.  Hgb 9.8 g/dl  s/p right THA and hip dislocations:  - Continue brace for now - Discuss with orthopedics Re: outpatient f/u, brace removal.  - The patient's debility from this remains to impair his ability to safely discharge home. We will pursue SNF placement.  [name], MD [facility] [email or web address] 03/19/2023, 3:30 PM

## 2023-03-19 NOTE — Progress Notes (Signed)
Physical Therapy Treatment Patient Details Name: Steven [name] MRN: [id number] DOB: 04/13/1935 Today's Date: 03/19/2023   History of Present Illness Pt is an 88 y.o. male presenting for evaluation of hyponatremia. Past medical history significant for ankylosing spondylitis, history of CVA, GERD, history of total right hip arthroplasty with spontaneous dislocations last month, and HLD.    PT Comments  Pt is progressing with mobility, he ambulated 150' with RW, no loss of balance. Noted distal strap on R hip abduction brace is not securing snuggly, pt's wife stated there's a missing piece that she has and will bring it in.     If plan is discharge home, recommend the following: A little help with walking and/or transfers;Help with stairs or ramp for entrance;Assist for transportation;Assistance with cooking/housework;A lot of help with bathing/dressing/bathroom   Can travel by private vehicle        Equipment Recommendations  None recommended by PT    Recommendations for Other Services       Precautions / Restrictions Precautions Precautions: Fall;Posterior Hip Precaution Comments: Pt able to recall post hip precatiuons, Required Braces or Orthoses: Other Brace Other Brace: hip abd brace (noted distal velcro strap is missing a piece and doesn't secure snuggly, wife stated there's a piece she'll bring in next time) Restrictions Weight Bearing Restrictions: No RLE Weight Bearing: Weight bearing as tolerated Other Position/Activity Restrictions: NO WB restrictions in orders upon eval per last hospitilization pt able to WBAT  with use of brace     Mobility  Bed Mobility Overal bed mobility: Needs Assistance Bed Mobility: Supine to Sit     Supine to sit: Min assist, HOB elevated Sit to supine: Min assist   General bed mobility comments: assist to raise trunk; min A for BLEs into bed    Transfers Overall transfer level: Needs assistance Equipment used: Rolling walker (2  wheels) Transfers: Sit to/from Stand Sit to Stand: Contact guard assist           General transfer comment: from elevated bed    Ambulation/Gait Ambulation/Gait assistance: Contact guard assist Gait Distance (Feet): 150 Feet Assistive device: Rolling walker (2 wheels) Gait Pattern/deviations: Step-to pattern, Decreased stride length, Trunk flexed Gait velocity: decreased     General Gait Details: no overt LOB, flexed posture 2* ankylosing spondylosis   Stairs             Wheelchair Mobility     Tilt Bed    Modified Rankin (Stroke Patients Only)       Balance Overall balance assessment: Needs assistance, History of Falls   Sitting balance-[name]: Fair Sitting balance - Comments: posterior lean requiring min/mod A for balance     Standing balance-[name]: Poor Standing balance comment: reliant on RW                            Cognition Arousal: Alert Behavior During Therapy: WFL for tasks assessed/performed Overall Cognitive Status: Within Functional Limits for tasks assessed                                 General Comments: HOH        Exercises      General Comments        Pertinent Vitals/Pain Pain Assessment Pain Assessment: No/denies pain Pain Score: 0-No pain    Home Living  Prior Function            PT Goals (current goals can now be found in the care plan section) Acute Rehab PT Goals Patient Stated Goal: Go back home PT Goal Formulation: With patient Time For Goal Achievement: 04/01/23 Potential to Achieve Goals: Good Progress towards PT goals: Progressing toward goals    Frequency    Min 1X/week      PT Plan      Co-evaluation              AM-PAC PT "6 Clicks" Mobility   Outcome Measure  Help needed turning from your back to your side while in a flat bed without using bedrails?: A Little Help needed moving from lying on your back to  sitting on the side of a flat bed without using bedrails?: A Lot Help needed moving to and from a bed to a chair (including a wheelchair)?: A Little Help needed standing up from a chair using your arms (e.g., wheelchair or bedside chair)?: A Little Help needed to walk in hospital room?: A Little Help needed climbing 3-5 steps with a railing? : A Lot 6 Click Score: 16    End of Session Equipment Utilized During Treatment: Other (comment) (R hip ABD brace) Activity Tolerance: Patient tolerated treatment well Patient left: in bed;with call bell/phone within reach;with family/visitor present;with bed alarm set Nurse Communication: Mobility status PT Visit Diagnosis: Unsteadiness on feet (R26.81);Other abnormalities of gait and mobility (R26.89);Repeated falls (R29.6);Muscle weakness (generalized) (M62.81);Difficulty in walking, not elsewhere classified (R26.2)     Time: 9629-5284 PT Time Calculation (min) (ACUTE ONLY): 16 min  Charges:    $Gait Training: 8-22 mins PT General Charges $$ ACUTE PT VISIT: 1 Visit                     Tamala Ser PT 03/19/2023  Acute Rehabilitation Services  Office [phone]

## 2023-03-19 NOTE — [place] FL2 (Signed)
[place] MEDICAID FL2 LEVEL OF CARE FORM     IDENTIFICATION  Patient Name: Steven [name] Birthdate: 03/05/1935 Sex: male Admission Date (Current Location): 03/16/2023  [facility] and [place] Number:  Producer, television/film/video and Address:  [facility],  [address] [place], [place] [address]      Provider Number: [id number]  Attending Physician Name and Address:  [name], MD  Relative Name and Phone Number:  Mickel Duhamel [phone]    Current Level of Care: Hospital Recommended Level of Care: Skilled Nursing Facility Prior Approval Number:    Date Approved/Denied: 03/19/23 PASRR Number: [id number]  Discharge Plan: SNF    Current Diagnoses: Patient Active Problem List   Diagnosis Date Noted   Hyponatremia 03/16/2023   Closed dislocation of right hip (HCC) 02/24/2023   Closed traumatic minimally displaced fracture of one rib of left side 02/24/2023   Sepsis due to pneumonia (HCC) 02/04/2023   Dyslipidemia 02/04/2023   GERD without esophagitis 02/04/2023   Community acquired pneumonia of right lung 02/04/2023   Gastroenteritis due to norovirus 07/11/2022   History of CVA (cerebrovascular accident) 07/11/2022   Iron deficiency anemia due to chronic blood loss 09/25/2021   Elevated CK 06/05/2021   Diarrhea 06/05/2021   Orthostatic hypotension 06/04/2021   Generalized weakness 06/04/2021   Fall at home, initial encounter 06/04/2021   Dehydration 06/03/2021   Alcohol use 06/03/2021   CVA (cerebral vascular accident) (HCC) 03/28/2021   Stenosis of left carotid artery    TIA (transient ischemic attack) 03/27/2021   Small bowel obstruction due to adhesions (HCC) 02/29/2020   Hypokalemia    Hypomagnesemia    Ankylosing spondylitis (HCC)    Major depressive disorder    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   Dyspnea on exertion 09/11/2016   Mixed hyperlipidemia 04/10/2014    Orientation RESPIRATION  BLADDER Height & Weight     Self, Time, Situation, Place  Normal Incontinent Weight: 120 lb 9.5 oz (54.7 kg) Height:  5\' 7"  (170.2 cm)  BEHAVIORAL SYMPTOMS/MOOD NEUROLOGICAL BOWEL NUTRITION STATUS      Continent Diet (Regular)  AMBULATORY STATUS COMMUNICATION OF NEEDS Skin   Limited Assist Verbally Other (Comment) (Non-pressure wound Coccyx Mid MASD)                       Personal Care Assistance Level of Assistance  Bathing, Feeding, Dressing Bathing Assistance: Maximum assistance Feeding assistance: Independent Dressing Assistance: Maximum assistance     Functional Limitations Info  Sight, Hearing, Speech Sight Info: Impaired Hearing Info: Impaired Speech Info: Adequate    SPECIAL CARE FACTORS FREQUENCY  PT (By licensed PT), OT (By licensed OT)     PT Frequency: 5x/wk OT Frequency: 5x/wk            Contractures Contractures Info: Not present    Additional Factors Info  Code Status, Allergies Code Status Info: FULL Allergies Info: Indomethacin, Lactose Intolerance (Gi)           Current Medications (03/19/2023):  This is the current hospital active medication list Current Facility-Administered Medications  Medication Dose Route Frequency Provider Last Rate Last Admin   acetaminophen (TYLENOL) tablet 650 mg  650 mg Oral Q6H PRN Opyd, Lavone Neri, MD       Or   acetaminophen (TYLENOL) suppository 650 mg  650 mg Rectal Q6H PRN Opyd, Lavone Neri, MD       aspirin EC tablet 325 mg  325 mg Oral Daily Opyd, Lavone Neri, MD   325 mg at 03/19/23 0954   bisacodyl (DULCOLAX) EC tablet 5 mg  5 mg Oral Daily PRN Opyd, Lavone Neri, MD       clopidogrel (PLAVIX) tablet 75 mg  75 mg Oral QHS Opyd, Lavone Neri, MD   75 mg at 03/18/23 2101   enoxaparin (LOVENOX) injection 40 mg  40 mg Subcutaneous Q24H Opyd, Lavone Neri, MD   40 mg at 03/18/23 0946   melatonin tablet 5 mg  5 mg Oral QHS Opyd, Lavone Neri, MD   5 mg at 03/18/23 2101   mirabegron ER (MYRBETRIQ) tablet 25 mg  25 mg Oral  Daily Opyd, Lavone Neri, MD   25 mg at 03/19/23 4034   oxyCODONE (Oxy IR/ROXICODONE) immediate release tablet 5 mg  5 mg Oral Q4H PRN Opyd, Lavone Neri, MD       pantoprazole (PROTONIX) EC tablet 40 mg  40 mg Oral QAC breakfast Opyd, Lavone Neri, MD   40 mg at 03/19/23 0745   polyethylene glycol (MIRALAX / GLYCOLAX) packet 17 g  17 g Oral Daily PRN Opyd, Lavone Neri, MD       rosuvastatin (CRESTOR) tablet 20 mg  20 mg Oral QHS Opyd, Lavone Neri, MD   20 mg at 03/18/23 2100   sodium chloride flush (NS) 0.9 % injection 3 mL  3 mL Intravenous Q12H Opyd, Lavone Neri, MD   3 mL at 03/19/23 0958   sodium chloride tablet 1 g  1 g Oral BID WC [name], MD   1 g at 03/19/23 0745     Discharge Medications: Please see discharge summary for a list of discharge medications.  Relevant Imaging Results:  Relevant Lab Results:   Additional Information SSN: [id number]  Otelia Santee, LCSW

## 2023-03-19 NOTE — TOC Progression Note (Signed)
Transition of Care [facility]) - Progression Note    Patient Details  Name: Steven [name] MRN: [id number] Date of Birth: 01-May-1934  Transition of Care [facility]) CM/SW Contact  Otelia Santee, LCSW Phone Number: 03/19/2023, 10:33 AM  Clinical Narrative:    Pt medically ready to discharge however, wife does not feel equipped to care for pt at home by herself. She is agreeable for pt to return to [facility] for further rehab. Pt is now in his co-pay days and she shares she will be unable to afford pt staying in [facility] more than a couple of weeks. Insurance Berkley Harvey has been requested and is currently pending.  Pt will need PTAR to SNF or to home as pt's wife states "there is no way I can manage him in a car with his brace."  TOC following for insurance approval.    Expected Discharge Plan: Home w Home Health Services Barriers to Discharge: No Barriers Identified  Expected Discharge Plan and Services In-house Referral: Clinical Social Work Discharge Planning Services: NA Post Acute Care Choice: Home Health, Durable Medical Equipment Living arrangements for the past 2 months: Single Family Home                 DME Arranged: Government social research officer (Wheelchair cushion) DME Agency: [employer] Date DME Agency Contacted: 03/18/23 Time DME Agency Contacted: 1315 Representative spoke with at DME Agency: Zack             Social Determinants of Health (SDOH) Interventions SDOH Screenings   Food Insecurity: No Food Insecurity (03/17/2023)  Housing: Low Risk  (03/17/2023)  Transportation Needs: No Transportation Needs (03/17/2023)  Utilities: Not At Risk (03/17/2023)  Financial Resource Strain: Low Risk  (03/09/2019)  Physical Activity: Insufficiently Active (03/09/2019)  Social Connections: Unknown (03/09/2019)  Stress: No Stress Concern Present (03/09/2019)  Tobacco Use: Medium Risk (03/17/2023)    Readmission Risk Interventions    03/18/2023    1:13 PM 02/23/2023   12:29 PM   Readmission Risk Prevention Plan  Transportation Screening Complete Complete  PCP or Specialist Appt within 3-5 Days Complete Complete  HRI or Home Care Consult Complete Complete  Social Work Consult for Recovery Care Planning/Counseling Complete Complete  Palliative Care Screening Not Applicable Not Applicable  Medication Review Oceanographer) Complete Complete

## 2023-03-20 ENCOUNTER — Inpatient Hospital Stay (HOSPITAL_COMMUNITY): Payer: Medicare Other

## 2023-03-20 DIAGNOSIS — E871 Hypo-osmolality and hyponatremia: Secondary | ICD-10-CM | POA: Diagnosis not present

## 2023-03-20 LAB — BASIC METABOLIC PANEL
Anion gap: 8 (ref 5–15)
BUN: 9 mg/dL (ref 8–23)
CO2: 23 mmol/L (ref 22–32)
Calcium: 8.4 mg/dL — ABNORMAL LOW (ref 8.9–10.3)
Chloride: 93 mmol/L — ABNORMAL LOW (ref 98–111)
Creatinine, Ser: 0.52 mg/dL — ABNORMAL LOW (ref 0.61–1.24)
GFR, Estimated: >60 mL/min (ref >60)
Glucose, Bld: 83 mg/dL (ref 70–99)
Potassium: 3.4 mmol/L — ABNORMAL LOW (ref 3.5–5.1)
Sodium: 124 mmol/L — ABNORMAL LOW (ref 135–145)

## 2023-03-20 LAB — CBC
HCT: 31.8 % — ABNORMAL LOW (ref 39.0–52.0)
Hemoglobin: 10.5 g/dL — ABNORMAL LOW (ref 13.0–17.0)
MCH: 31.6 pg (ref 26.0–34.0)
MCHC: 33 g/dL (ref 30.0–36.0)
MCV: 95.8 fL (ref 80.0–100.0)
Platelets: 329 10*3/uL (ref 150–400)
RBC: 3.32 MIL/uL — ABNORMAL LOW (ref 4.22–5.81)
RDW: 13.3 % (ref 11.5–15.5)
WBC: 6.7 10*3/uL (ref 4.0–10.5)
nRBC: 0 % (ref 0.0–0.2)

## 2023-03-20 LAB — PROCALCITONIN: Procalcitonin: <0.1 ng/mL

## 2023-03-20 MED ORDER — SODIUM CHLORIDE 1 G PO TABS
2.0000 g | ORAL_TABLET | Freq: Three times a day (TID) | ORAL | Status: DC
  Administered 2023-03-20 – 2023-03-22 (×6): 2 g via ORAL
  Filled 2023-03-20 (×6): qty 2

## 2023-03-20 NOTE — Progress Notes (Signed)
[facility] PROGRESS NOTE  Steven [name] (DOB: [birth year]) WUJ:[id number] PCP: [name], MD  Brief Narrative: Steven [name] is an 88 y.o. male with a history of ankylosing spondylitis, history of CVA, GERD, s/p right THA complicated by dislocation who presented to the ED from SNF on 03/16/2023 due to low sodium level found on labs. He reported no change in chronic fatigue, imbalance/dizziness and no change in oral intake of late. He had stable vital signs. Labs are most notable for sodium 120, normal creatinine, normal WBC. He was given 500 mL of normal saline in the ED and admitted. Fluid restriction was implemented with some improvement in sodium level. Salt tabs are added, titrating upward.   Subjective: No complaints. He denies any respiratory issues at this time, he does have a cough every so often.  Objective: BP (!) 104/56 (BP Location: Left Arm)   Pulse 73   Temp (!) 96.8 F (36 C) (Oral)   Resp 16   Ht 5\' 7"  (1.702 m)   Wt 54.9 kg   SpO2 99%   BMI 18.96 kg/m   Gen: Pleasant, elderly, active male in no distress Pulm: Clear, nonlabored CV: No edema GI: Soft, NT, ND, +BS  Neuro: Alert and oriented. No new focal deficits.   Assessment & Plan: Hyponatremia: Suspect SIADH. - Continue to monitor BMP. Na 120 ultimately up to 128 though has trended back downward to 124.  - Increase salt tabs, continue 1,200cc fluid restriction. He remains asymptomatic and likely has low baseline. D/w nephrology who agrees with management.  - Maintain adequate protein/solute intake.  - Check CXR, note recent admission for RML PNA.  History of CVA:  - Continue ASA, plavix, rosuvastatin  Ankylosing spondylitis:  - Continue MTX  Hypokalemia:  - Supplemented and resolved.  Iron deficiency anemia: Chronic, stable, well managed based on normocytic indices.  Hgb 9.8 g/dl  s/p right THA and hip dislocations:  - Continue brace for now - Discuss with orthopedics Re: outpatient f/u,  brace removal.  - The patient's debility from this remains to impair his ability to safely discharge home. We will pursue SNF placement.  [name], MD [facility] [email or web address] 03/20/2023, 3:45 PM

## 2023-03-20 NOTE — Plan of Care (Signed)

## 2023-03-20 NOTE — Progress Notes (Signed)
Mobility Specialist - Progress Note   03/20/23 1100  Mobility  Activity Ambulated with assistance in hallway  Level of Assistance Minimal assist, patient does 75% or more  Assistive Device Front wheel walker  Distance Ambulated (ft) 140 ft  Range of Motion/Exercises Active  Activity Response Tolerated well  Mobility Referral Yes  Mobility visit 1 Mobility  Mobility Specialist Start Time (ACUTE ONLY) 1030  Mobility Specialist Stop Time (ACUTE ONLY) 1059  Mobility Specialist Time Calculation (min) (ACUTE ONLY) 29 min   Received in bed and agreed to mobility. Upon getting out of bed, noticed pt had BM. Got NT to assist in peri care. Once through and brace applied, ambulated with no issues in hallway. Returned to chair with all needs met.  Marilynne Halsted Mobility Specialist

## 2023-03-21 DIAGNOSIS — E871 Hypo-osmolality and hyponatremia: Secondary | ICD-10-CM | POA: Diagnosis not present

## 2023-03-21 LAB — BASIC METABOLIC PANEL
Anion gap: 6 (ref 5–15)
BUN: 11 mg/dL (ref 8–23)
CO2: 25 mmol/L (ref 22–32)
Calcium: 8.3 mg/dL — ABNORMAL LOW (ref 8.9–10.3)
Chloride: 96 mmol/L — ABNORMAL LOW (ref 98–111)
Creatinine, Ser: 0.5 mg/dL — ABNORMAL LOW (ref 0.61–1.24)
GFR, Estimated: >60 mL/min (ref >60)
Glucose, Bld: 91 mg/dL (ref 70–99)
Potassium: 3.5 mmol/L (ref 3.5–5.1)
Sodium: 127 mmol/L — ABNORMAL LOW (ref 135–145)

## 2023-03-21 LAB — CBC
HCT: 28.6 % — ABNORMAL LOW (ref 39.0–52.0)
Hemoglobin: 9.8 g/dL — ABNORMAL LOW (ref 13.0–17.0)
MCH: 32 pg (ref 26.0–34.0)
MCHC: 34.3 g/dL (ref 30.0–36.0)
MCV: 93.5 fL (ref 80.0–100.0)
Platelets: 266 10*3/uL (ref 150–400)
RBC: 3.06 MIL/uL — ABNORMAL LOW (ref 4.22–5.81)
RDW: 13.3 % (ref 11.5–15.5)
WBC: 7.3 10*3/uL (ref 4.0–10.5)
nRBC: 0 % (ref 0.0–0.2)

## 2023-03-21 NOTE — TOC Progression Note (Signed)
Transition of Care [facility]) - Progression Note    Patient Details  Name: Steven [name] MRN: [id number] Date of Birth: [birth year]  Transition of Care [facility]) CM/SW Contact  Adrian Prows, RN Phone Number: 03/21/2023, 2:49 PM  Clinical Narrative:    Candelaria Celeste for status of ins Berkley Harvey; given Plan Auth ID # [id number], Auth ID #  [id number], status pending; spoke w/ Sherry Ruffing, Rep for [employer]; she says ins Berkley Harvey is still pending.    Expected Discharge Plan: Home w Home Health Services Barriers to Discharge: No Barriers Identified  Expected Discharge Plan and Services In-house Referral: Clinical Social Work Discharge Planning Services: NA Post Acute Care Choice: Home Health, Durable Medical Equipment Living arrangements for the past 2 months: Single Family Home                 DME Arranged: Government social research officer (Wheelchair cushion) DME Agency: [employer] Date DME Agency Contacted: 03/18/23 Time DME Agency Contacted: 1315 Representative spoke with at DME Agency: Zack             Social Determinants of Health (SDOH) Interventions SDOH Screenings   Food Insecurity: No Food Insecurity (03/17/2023)  Housing: Low Risk  (03/17/2023)  Transportation Needs: No Transportation Needs (03/17/2023)  Utilities: Not At Risk (03/17/2023)  Financial Resource Strain: Low Risk  (03/09/2019)  Physical Activity: Insufficiently Active (03/09/2019)  Social Connections: Unknown (03/09/2019)  Stress: No Stress Concern Present (03/09/2019)  Tobacco Use: Medium Risk (03/17/2023)    Readmission Risk Interventions    03/18/2023    1:13 PM 02/23/2023   12:29 PM  Readmission Risk Prevention Plan  Transportation Screening Complete Complete  PCP or Specialist Appt within 3-5 Days Complete Complete  HRI or Home Care Consult Complete Complete  Social Work Consult for Recovery Care Planning/Counseling Complete Complete  Palliative Care Screening Not Applicable Not Applicable  Medication Review Special educational needs teacher) Complete Complete

## 2023-03-21 NOTE — Progress Notes (Signed)
[facility] PROGRESS NOTE  DVAUGHN GWYN (DOB: 1935/04/01) ZOX:[id number] PCP: [name], MD  Brief Narrative: Steven [name] is an 88 y.o. male with a history of ankylosing spondylitis, history of CVA, GERD, s/p right THA complicated by dislocation who presented to the ED from SNF on 03/16/2023 due to low sodium level found on labs. He reported no change in chronic fatigue, imbalance/dizziness and no change in oral intake of late. He had stable vital signs. Labs are most notable for sodium 120, normal creatinine, normal WBC. He was given 500 mL of normal saline in the ED and admitted. Fluid restriction was implemented with some improvement in sodium level. Salt tabs are added, titrating upward.   Subjective: No cough, chest pain, dyspnea, wheezing. No trouble eating or other complaints. Spouse at bedside.  Objective: BP 106/62 (BP Location: Left Arm)   Pulse 76   Temp 97.7 F (36.5 C)   Resp 18   Ht 5\' 7"  (1.702 m)   Wt 53.2 kg   SpO2 100%   BMI 18.37 kg/m   Gen: No distress elderly male Pulm: Clear, nonlabored  CV: RRR, no MRG, no edema GI: Soft, NT, ND, +BS  Neuro: Alert and oriented. No new focal deficits. Ext: Warm, no deformities. Skin: No rashes, lesions or ulcers on visualized skin   Assessment & Plan: Hyponatremia: Suspect SIADH, continue fluid restriction 1,200cc/day and TID salt tabs, monitor BMP early this next week. Maintain adequate protein/solute intake.   CXR opacity: More consistent with atelectasis in absence of respiratory symptoms and negative PCT. Urge IS and mobility, consider repeat chest imaging at follow up.  History of CVA:  - Continue ASA, plavix, rosuvastatin  Ankylosing spondylitis:  - Continue MTX  Hypokalemia:  - Supplemented and resolved.  Iron deficiency anemia: Chronic, stable, well managed based on normocytic indices. Hgb 9.8 g/dl   s/p right THA and hip dislocations:  - Continue brace for now - Follow up with Dr. Blanchie Dessert  12/10. - The patient's debility from this remains to impair his ability to safely discharge home. Pursing SNF placement. Medically stable for discharge but insurance authorization not obtained yet.  [name], MD [facility] [email or web address] 03/21/2023, 3:35 PM

## 2023-03-22 DIAGNOSIS — E871 Hypo-osmolality and hyponatremia: Secondary | ICD-10-CM | POA: Diagnosis not present

## 2023-03-22 LAB — BASIC METABOLIC PANEL
Anion gap: 9 (ref 5–15)
BUN: 12 mg/dL (ref 8–23)
CO2: 22 mmol/L (ref 22–32)
Calcium: 8.1 mg/dL — ABNORMAL LOW (ref 8.9–10.3)
Chloride: 96 mmol/L — ABNORMAL LOW (ref 98–111)
Creatinine, Ser: 0.39 mg/dL — ABNORMAL LOW (ref 0.61–1.24)
GFR, Estimated: >60 mL/min (ref >60)
Glucose, Bld: 83 mg/dL (ref 70–99)
Potassium: 3.3 mmol/L — ABNORMAL LOW (ref 3.5–5.1)
Sodium: 127 mmol/L — ABNORMAL LOW (ref 135–145)

## 2023-03-22 MED ORDER — POTASSIUM CHLORIDE CRYS ER 20 MEQ PO TBCR
40.0000 meq | EXTENDED_RELEASE_TABLET | Freq: Once | ORAL | Status: AC
  Administered 2023-03-22: 40 meq via ORAL
  Filled 2023-03-22: qty 2

## 2023-03-22 MED ORDER — SODIUM CHLORIDE 1 G PO TABS: 2.0000 g | ORAL_TABLET | Freq: Three times a day (TID) | ORAL | 0 refills | Status: AC

## 2023-03-22 NOTE — Discharge Summary (Signed)
Physician Discharge Summary   Patient: Steven [name] MRN: [id number] DOB: 08-27-1934  Admit date:     03/16/2023  Discharge date: 03/22/23  Discharge Physician: [name]   PCP: [name], MD   Recommendations at discharge:  Repeat BMP later this week to monitor hyponatremia. Follow up with orthopedics, Dr. Blanchie Dessert, 12/10 as previously scheduled.  Consider repeat chest imaging (see below)  Discharge Diagnoses: Principal Problem:   Hyponatremia Active Problems:   Ankylosing spondylitis (HCC)   Major depressive disorder   CVA (cerebral vascular accident) [facility])  Hospital Course: Steven [name] is an 88 y.o. male with a history of ankylosing spondylitis, history of CVA, GERD, s/p right THA complicated by dislocation who presented to the ED from SNF on 03/16/2023 due to low sodium level found on labs. He reported no change in chronic fatigue, imbalance/dizziness and no change in oral intake of late. He had stable vital signs. Labs are most notable for sodium 120, normal creatinine, normal WBC. He was given 500 mL of normal saline in the ED and admitted. Fluid restriction was implemented with some improvement in sodium level. Salt tabs are added and sodium level has further improved. His weakness made return to SNF necessary initially, but with improvement in hyponatremia and continued PT/OT here, he has improved to the point the he wishes to return home from the hospital. Home health has been ordered and arranged at discharge.   Assessment and Plan: Hyponatremia: Suspect SIADH, continue fluid restriction 1,200cc/day and TID salt tabs, monitor BMP early this next week. Maintain adequate protein/solute intake.    CXR opacity: More consistent with atelectasis in absence of respiratory symptoms and negative PCT. Encourage incentive spirometry and mobility, consider repeat chest imaging at follow up.   History of CVA:  - Continue ASA, plavix, rosuvastatin   Ankylosing spondylitis:  -  Continue MTX   Hypokalemia:  - Supplemented and resolved.   Iron deficiency anemia: Chronic, stable, well managed based on normocytic indices. Hgb 9.8 g/dl    s/p right THA and hip dislocations:  - Continue brace for now - Follow up with Dr. Blanchie Dessert 12/10. - Home health maximized.  Consultants: Nephrology curbside Procedures performed: None  Disposition: Home Diet recommendation: Regular with 1,200cc fluid restriction DISCHARGE MEDICATION: Allergies as of 03/22/2023       Reactions   Indomethacin Hives   Lactose Intolerance (gi) Nausea And Vomiting, Other (See Comments)   GI UPSET        Medication List     STOP taking these medications    nystatin 100000 UNIT/ML suspension Commonly known as: MYCOSTATIN       TAKE these medications    acetaminophen 325 MG tablet Commonly known as: TYLENOL Take 325-650 mg by mouth every 6 (six) hours as needed for mild pain (pain score 1-3) or headache.   aspirin EC 325 MG tablet Take 1 tablet (325 mg total) by mouth daily.   Biotin 5000 MCG Tabs Take 5,000 mcg by mouth daily.   buPROPion 150 MG 24 hr tablet Commonly known as: WELLBUTRIN XL Take 150 mg by mouth daily.   Calcium Citrate 250 MG Tabs Take 250 mg by mouth daily.   CALCIUM+D3 PO Take 2 tablets by mouth 2 (two) times daily with a meal.   clopidogrel 75 MG tablet Commonly known as: PLAVIX Take 1 tablet (75 mg total) by mouth daily. What changed: when to take this   cyanocobalamin 1000 MCG tablet Commonly known as: VITAMIN B12 Take 1,000  mcg by mouth daily.   fluoruracil 0.5 % cream Commonly known as: CARAC Apply 1 application  topically daily as needed (for cancer treatment- as directed to affected areas).   folic acid 1 MG tablet Commonly known as: FOLVITE Take 1 mg by mouth in the morning and at bedtime.   Gemtesa 75 MG Tabs Generic drug: Vibegron Take 75 mg by mouth at bedtime.   ipratropium 0.06 % nasal spray Commonly known as:  ATROVENT Place 2 sprays into both nostrils 2 (two) times daily as needed (allergies).   lactose free nutrition Liqd Take 237 mLs by mouth 2 (two) times daily between meals.   melatonin 5 MG Tabs Take 5 mg by mouth at bedtime.   methotrexate 2.5 MG tablet Commonly known as: RHEUMATREX Take 20 mg by mouth every Sunday.   oxyCODONE 5 MG immediate release tablet Commonly known as: Roxicodone Take 1 tablet (5 mg total) by mouth every 6 (six) hours as needed for severe pain (pain score 7-10).   pantoprazole 40 MG tablet Commonly known as: PROTONIX Take 1 tablet (40 mg total) by mouth daily. What changed: when to take this   polyethylene glycol 17 g packet Commonly known as: MIRALAX / GLYCOLAX Take 17 g by mouth daily.   PRESERVISION AREDS 2 PO Take 1 capsule by mouth in the morning and at bedtime.   rosuvastatin 20 MG tablet Commonly known as: CRESTOR Take 1 tablet (20 mg total) by mouth at bedtime.   senna-docusate 8.6-50 MG tablet Commonly known as: Senokot-S Take 1 tablet by mouth daily.   sodium chloride 1 g tablet Take 2 tablets (2 g total) by mouth 3 (three) times daily with meals. What changed:  how much to take when to take this   Soothe XP Soln Place 1 drop into both eyes 2 (two) times daily as needed (for dryness).   Vitamin D3 1000 units Caps Take 2,000 Units by mouth daily.        Follow-up Information     [name], MD Follow up.   Specialty: Internal Medicine Contact information: [address] [place] [address] [phone]         Jaci Standard, MD Follow up.   Specialty: Hematology and Oncology Contact information: [address] [place] [address] [phone]                Discharge Exam: Filed Weights   03/20/23 0505 03/21/23 0501 03/22/23 0503  Weight: 54.9 kg 53.2 kg 53.4 kg  BP 106/61 (BP Location: Left Arm)   Pulse 66   Temp 98.2 F (36.8 C) (Oral)   Resp 16   Ht 5\' 7"  (1.702  m)   Wt 53.4 kg   SpO2 98%   BMI 18.44 kg/m   No distress, well-appearing thin elderly male Clear, nonlabored RRR Pleasant, alert, oriented without focal deficit No edema  Condition at discharge: stable  The results of significant diagnostics from this hospitalization (including imaging, microbiology, ancillary and laboratory) are listed below for reference.   Imaging Studies: DG Chest 2 View  Result Date: 03/20/2023 CLINICAL DATA:  Hyponatremia.  Low-sodium levels.  Cough.  Weakness. EXAM: CHEST - 2 VIEW COMPARISON:  02/19/2023 FINDINGS: Hyperinflation. Nonacute left rib fractures. Midline trachea. Normal heart size. Atherosclerosis in the transverse aorta. No pleural effusion or pneumothorax. New or increased medial right lower lobe opacity IMPRESSION: New or increased medial right lower lobe opacity, favoring pneumonia over atelectasis. Underlying hyperinflation suggests COPD. Aortic  Atherosclerosis (ICD10-I70.0). Electronically Signed   By: Jeronimo Greaves M.D.   On: 03/20/2023 12:26    Microbiology: Results for orders placed or performed during the hospital encounter of 03/16/23  Resp panel by RT-PCR (RSV, Flu A&B, Covid) Anterior Nasal Swab     Status: None   Collection Time: 03/16/23 11:10 PM   Specimen: Anterior Nasal Swab  Result Value Ref Range Status   SARS Coronavirus 2 by RT PCR NEGATIVE NEGATIVE Final    Comment: (NOTE) SARS-CoV-2 target nucleic acids are NOT DETECTED.  The SARS-CoV-2 RNA is generally detectable in upper respiratory specimens during the acute phase of infection. The lowest concentration of SARS-CoV-2 viral copies this assay can detect is 138 copies/mL. A negative result does not preclude SARS-Cov-2 infection and should not be used as the sole basis for treatment or other patient management decisions. A negative result may occur with  improper specimen collection/handling, submission of specimen other than nasopharyngeal swab, presence of viral  mutation(s) within the areas targeted by this assay, and inadequate number of viral copies(<138 copies/mL). A negative result must be combined with clinical observations, patient history, and epidemiological information. The expected result is Negative.  Fact Sheet for Patients:  BloggerCourse.com  Fact Sheet for Healthcare Providers:  SeriousBroker.it  This test is no t yet approved or cleared by the [place] FDA and  has been authorized for detection and/or diagnosis of SARS-CoV-2 by FDA under an Emergency Use Authorization (EUA). This EUA will remain  in effect (meaning this test can be used) for the duration of the COVID-19 declaration under Section 564(b)(1) of the Act, 21 U.S.C.section 360bbb-3(b)(1), unless the authorization is terminated  or revoked sooner.       Influenza A by PCR NEGATIVE NEGATIVE Final   Influenza B by PCR NEGATIVE NEGATIVE Final    Comment: (NOTE) The Xpert Xpress SARS-CoV-2/FLU/RSV plus assay is intended as an aid in the diagnosis of influenza from Nasopharyngeal swab specimens and should not be used as a sole basis for treatment. Nasal washings and aspirates are unacceptable for Xpert Xpress SARS-CoV-2/FLU/RSV testing.  Fact Sheet for Patients: BloggerCourse.com  Fact Sheet for Healthcare Providers: SeriousBroker.it  This test is not yet approved or cleared by the [place] FDA and has been authorized for detection and/or diagnosis of SARS-CoV-2 by FDA under an Emergency Use Authorization (EUA). This EUA will remain in effect (meaning this test can be used) for the duration of the COVID-19 declaration under Section 564(b)(1) of the Act, 21 U.S.C. section 360bbb-3(b)(1), unless the authorization is terminated or revoked.     Resp Syncytial Virus by PCR NEGATIVE NEGATIVE Final    Comment: (NOTE) Fact Sheet for  Patients: BloggerCourse.com  Fact Sheet for Healthcare Providers: SeriousBroker.it  This test is not yet approved or cleared by the [place] FDA and has been authorized for detection and/or diagnosis of SARS-CoV-2 by FDA under an Emergency Use Authorization (EUA). This EUA will remain in effect (meaning this test can be used) for the duration of the COVID-19 declaration under Section 564(b)(1) of the Act, 21 U.S.C. section 360bbb-3(b)(1), unless the authorization is terminated or revoked.  Performed at [facility], [address] [place], [place] [address]     Labs: CBC: Recent Labs  Lab 03/16/23 2224 03/17/23 0545 03/20/23 1636 03/21/23 0519  WBC 6.8 5.5 6.7 7.3  NEUTROABS 4.6  --   --   --   HGB 10.5* 9.8* 10.5* 9.8*  HCT 30.2* 28.0* 31.8* 28.6*  MCV 91.2 91.8 95.8 93.5  PLT 346 300 329 266   Basic Metabolic Panel: Recent Labs  Lab 03/18/23 0617 03/19/23 0555 03/20/23 0520 03/21/23 0519 03/22/23 0503  NA 128* 127* 124* 127* 127*  K 3.9 3.9 3.4* 3.5 3.3*  CL 96* 96* 93* 96* 96*  CO2 26 25 23 25 22   GLUCOSE 90 90 83 91 83  BUN 9 8 9 11 12   CREATININE 0.54* 0.57* 0.52* 0.50* 0.39*  CALCIUM 8.4* 8.4* 8.4* 8.3* 8.1*   Liver Function Tests: No results for input(s): "AST", "ALT", "ALKPHOS", "BILITOT", "PROT", "ALBUMIN" in the last 168 hours. CBG: No results for input(s): "GLUCAP" in the last 168 hours.  Discharge time spent: greater than 30 minutes.  Signed: [name], MD [facility] 03/22/2023

## 2023-03-22 NOTE — Care Management Important Message (Signed)
Important Message  Patient Details IM Letter given. Name: Steven [name] MRN: [id number] Date of Birth: 02/14/1935   Important Message Given:  Yes - Medicare IM     Caren Macadam 03/22/2023, 10:14 AM

## 2023-03-22 NOTE — TOC Transition Note (Signed)
Transition of Care [facility]) - CM/SW Discharge Note   Patient Details  Name: Steven [name] MRN: [id number] Date of Birth: Jun 26, 1934  Transition of Care [facility]) CM/SW Contact:  Otelia Santee, LCSW Phone Number: 03/22/2023, 12:15 PM   Clinical Narrative:    Pt and wife have elected for pt to discharge home vs SNF. Pt will be receiving home based primary care services through [facility]. [facility] will also be providing HHPT/OT/RN services. Pt and spouse requesting EMS transport home. [facility] has been called at 12:15pm for transportation.      Barriers to Discharge: No Barriers Identified   Patient Goals and CMS Choice CMS Medicare.gov Compare Post Acute Care list provided to:: Patient Choice offered to / list presented to : Patient, Spouse  Discharge Placement                  Patient to be transferred to facility by: [facility] Name of family member notified: Patient and spouse Patient and family notified of of transfer: 03/22/23  Discharge Plan and Services Additional resources added to the After Visit Summary for   In-house Referral: Clinical Social Work Discharge Planning Services: NA Post Acute Care Choice: Home Health, Durable Medical Equipment          DME Arranged: Government social research officer (Wheelchair cushion) DME Agency: [employer] Date DME Agency Contacted: 03/18/23 Time DME Agency Contacted: 1315 Representative spoke with at DME Agency: Zack            Social Determinants of Health (SDOH) Interventions SDOH Screenings   Food Insecurity: No Food Insecurity (03/17/2023)  Housing: Low Risk  (03/17/2023)  Transportation Needs: No Transportation Needs (03/17/2023)  Utilities: Not At Risk (03/17/2023)  Financial Resource Strain: Low Risk  (03/09/2019)  Physical Activity: Insufficiently Active (03/09/2019)  Social Connections: Unknown (03/09/2019)  Stress: No Stress Concern Present (03/09/2019)  Tobacco Use: Medium Risk (03/17/2023)     Readmission Risk  Interventions    03/18/2023    1:13 PM 02/23/2023   12:29 PM  Readmission Risk Prevention Plan  Transportation Screening Complete Complete  PCP or Specialist Appt within 3-5 Days Complete Complete  HRI or Home Care Consult Complete Complete  Social Work Consult for Recovery Care Planning/Counseling Complete Complete  Palliative Care Screening Not Applicable Not Applicable  Medication Review Oceanographer) Complete Complete

## 2023-03-22 NOTE — Consult Note (Signed)
[facility] Liaison Consult Note    03/22/2023  Steven [name] 1934/04/27 [id number]  Follow up:  Update no longer SNF but [facility] Liaison remote coverage review for patient admitted to [facility]  Patient's discharge plan is for [facility] home base program was noted per Inpatient TOC LCSW.   Charlesetta Shanks, RN, BSN, CCM [employer], [facility] Liaison Direct Dial: [phone] or secure chat Email: [email or web address]

## 2023-03-22 NOTE — Progress Notes (Signed)
Mobility Specialist - Progress Note   03/22/23 0900  Mobility  Activity Ambulated with assistance to bathroom  Level of Assistance Contact guard assist, steadying assist  Assistive Device Front wheel walker  Distance Ambulated (ft) 15 ft  Range of Motion/Exercises Active  RLE Weight Bearing WBAT  Activity Response Tolerated well  Mobility Referral Yes  Mobility visit 1 Mobility  Mobility Specialist Start Time (ACUTE ONLY) 0901  Mobility Specialist Stop Time (ACUTE ONLY) 0915  Mobility Specialist Time Calculation (min) (ACUTE ONLY) 14 min   Pt requesting assistance to restroom. Standby for STS when EOB. Contact for ambulating, no issues and returned to bed with all needs met. Alarm on.  Marilynne Halsted Mobility Specialist

## 2023-03-22 NOTE — Progress Notes (Signed)
Physical Therapy Treatment Patient Details Name: Steven [name] MRN: [id number] DOB: [birth year] Today's Date: 03/22/2023   History of Present Illness Pt is an 88 y.o. male presenting for evaluation of hyponatremia. Past medical history significant for ankylosing spondylitis, history of CVA, GERD, history of total right hip arthroplasty with spontaneous dislocations last month, and HLD.    PT Comments  Pt admitted with above diagnosis.  Pt currently with functional limitations due to the deficits listed below (see PT Problem List). Pt in bed when PT arrived. Pt agreeable to therapy intervention. Pt required increased time, S and cues with use of hospital bed for supine to sit, pt required set up and mod A to don R hip abductor brace in sitting with cues, pt indicated that his wife would be able to assist, pt required CGA and cues for sit to stand  from EOB, pt able to recall 2/3 posterior hip precautions, gait tasks in hallway with RW, CGA and progressing to close S and min cues with flexed posture, step through pattern with narrow BOS and decreased cadence with R hip abd brace donned and step navigation in stairwell x 10 steps with B hand rail, cues and CGA. Pt indicated no pain prior, during or s/p activity. Pt requested PT doff R abd brace while seated in recliner and pt required total A. Pt ed provided on safety, fall risk prevention, use of call bell, importance of R hip abd brace donned for R LE WB and posterior hip precautions. Pt left in recliner and all needs in place. Pt reports he is to d/c home with wife and HH vs return to SNF for skilled therapy services. Pt will benefit from acute skilled PT to increase their independence and safety with mobility to allow discharge.     If plan is discharge home, recommend the following: A little help with walking and/or transfers;Help with stairs or ramp for entrance;Assist for transportation;Assistance with cooking/housework;A lot of help with  bathing/dressing/bathroom   Can travel by private vehicle     Yes  Equipment Recommendations  None recommended by PT    Recommendations for Other Services       Precautions / Restrictions Precautions Precautions: Fall;Posterior Hip Precaution Booklet Issued: Yes (comment) Precaution Comments: Pt able to 2/3 recall post hip precatiuons, Required Braces or Orthoses: Other Brace (hip abductor brace) Restrictions Weight Bearing Restrictions: No RLE Weight Bearing: Weight bearing as tolerated Other Position/Activity Restrictions: R LE  WBAT  with use of brace     Mobility  Bed Mobility Overal bed mobility: Needs Assistance Bed Mobility: Supine to Sit     Supine to sit: HOB elevated, Supervision     General bed mobility comments: increased time, use of hospital bed and min cues    Transfers Overall transfer level: Needs assistance Equipment used: Rolling walker (2 wheels) Transfers: Sit to/from Stand Sit to Stand: Contact guard assist           General transfer comment: from elevated bed    Ambulation/Gait Ambulation/Gait assistance: Contact guard assist, Supervision Gait Distance (Feet): 200 Feet Assistive device: Rolling walker (2 wheels) Gait Pattern/deviations: Trunk flexed, Step-through pattern, Narrow base of support Gait velocity: decreased     General Gait Details: R hip abductor brace donned. no overt LOB, flexed posture 2* ankylosing spondylosis, narrow BOS with B LEs just passing in stance phase   Stairs Stairs: Yes Stairs assistance: Contact guard assist Stair Management: Two rails Number of Stairs: 10 General stair comments: cues for  proper sequencing and technique   Wheelchair Mobility     Tilt Bed    Modified Rankin (Stroke Patients Only)       Balance Overall balance assessment: Needs assistance, History of Falls Sitting-balance support: Feet supported Sitting balance-[name]: Good     Standing balance support: Reliant  on assistive device for balance, During functional activity, Bilateral upper extremity supported Standing balance-[name]: Poor Standing balance comment: reliant on RW with S and cues for extension posture as able                            Cognition Arousal: Alert Behavior During Therapy: WFL for tasks assessed/performed Overall Cognitive Status: Within Functional Limits for tasks assessed                                 General Comments: HOH (B hearing aids)        Exercises      General Comments        Pertinent Vitals/Pain Pain Assessment Pain Assessment: No/denies pain    Home Living Family/patient expects to be discharged to:: Private residence Living Arrangements: Spouse/significant other Available Help at Discharge: Family;Available 24 hours/day Type of Home: House Home Access: Stairs to enter Entrance Stairs-Rails: Right;Left;Can reach both Entrance Stairs-Number of Steps: 3 Alternate Level Stairs-Number of Steps: 13 Home Layout: Two level;Able to live on main level with bedroom/bathroom Home Equipment: Rolling Walker (2 wheels);Cane - single point;Shower seat;Grab bars - tub/shower;BSC/3in1;Hand held shower head Additional Comments: Was living at home with wife prior to d/c to SNF. Independent prior. 1 level home. 5 steps to enter with B railings.    Prior Function            PT Goals (current goals can now be found in the care plan section) Acute Rehab PT Goals Patient Stated Goal: Go back home PT Goal Formulation: With patient Time For Goal Achievement: 04/01/23 Potential to Achieve Goals: Good Progress towards PT goals: Progressing toward goals    Frequency    Min 1X/week      PT Plan      Co-evaluation              AM-PAC PT "6 Clicks" Mobility   Outcome Measure  Help needed turning from your back to your side while in a flat bed without using bedrails?: A Little Help needed moving from lying on your  back to sitting on the side of a flat bed without using bedrails?: A Little Help needed moving to and from a bed to a chair (including a wheelchair)?: A Little Help needed standing up from a chair using your arms (e.g., wheelchair or bedside chair)?: A Little Help needed to walk in hospital room?: A Little Help needed climbing 3-5 steps with a railing? : A Little 6 Click Score: 18    End of Session Equipment Utilized During Treatment: Other (comment) (R hip ABD brace) Activity Tolerance: Patient tolerated treatment well Patient left: with call bell/phone within reach;in chair;with chair alarm set Nurse Communication: Mobility status PT Visit Diagnosis: Unsteadiness on feet (R26.81);Other abnormalities of gait and mobility (R26.89);Repeated falls (R29.6);Muscle weakness (generalized) (M62.81);Difficulty in walking, not elsewhere classified (R26.2)     Time: 1010-1033 PT Time Calculation (min) (ACUTE ONLY): 23 min  Charges:    $Gait Training: 8-22 mins $Therapeutic Activity: 8-22 mins PT General Charges $$ ACUTE PT VISIT:  1 Visit                     Johnny Bridge, PT Acute Rehab    Jacqualyn Posey 03/22/2023, 11:54 AM

## 2023-03-23 DIAGNOSIS — D509 Iron deficiency anemia, unspecified: Secondary | ICD-10-CM | POA: Diagnosis not present

## 2023-03-23 DIAGNOSIS — E871 Hypo-osmolality and hyponatremia: Secondary | ICD-10-CM | POA: Diagnosis not present

## 2023-03-23 DIAGNOSIS — Z96641 Presence of right artificial hip joint: Secondary | ICD-10-CM | POA: Diagnosis not present

## 2023-03-23 DIAGNOSIS — L89309 Pressure ulcer of unspecified buttock, unspecified stage: Secondary | ICD-10-CM | POA: Diagnosis not present

## 2023-03-23 DIAGNOSIS — M1990 Unspecified osteoarthritis, unspecified site: Secondary | ICD-10-CM | POA: Diagnosis not present

## 2023-03-23 DIAGNOSIS — L89891 Pressure ulcer of other site, stage 1: Secondary | ICD-10-CM | POA: Diagnosis not present

## 2023-03-23 DIAGNOSIS — Z7902 Long term (current) use of antithrombotics/antiplatelets: Secondary | ICD-10-CM | POA: Diagnosis not present

## 2023-03-23 DIAGNOSIS — Z87442 Personal history of urinary calculi: Secondary | ICD-10-CM | POA: Diagnosis not present

## 2023-03-23 DIAGNOSIS — Z9842 Cataract extraction status, left eye: Secondary | ICD-10-CM | POA: Diagnosis not present

## 2023-03-23 DIAGNOSIS — R634 Abnormal weight loss: Secondary | ICD-10-CM | POA: Diagnosis not present

## 2023-03-23 DIAGNOSIS — Z9841 Cataract extraction status, right eye: Secondary | ICD-10-CM | POA: Diagnosis not present

## 2023-03-23 DIAGNOSIS — N3281 Overactive bladder: Secondary | ICD-10-CM | POA: Diagnosis not present

## 2023-03-23 DIAGNOSIS — Z0001 Encounter for general adult medical examination with abnormal findings: Secondary | ICD-10-CM | POA: Diagnosis not present

## 2023-03-23 DIAGNOSIS — M452 Ankylosing spondylitis of cervical region: Secondary | ICD-10-CM | POA: Diagnosis not present

## 2023-03-23 DIAGNOSIS — Z681 Body mass index (BMI) 19 or less, adult: Secondary | ICD-10-CM | POA: Diagnosis not present

## 2023-03-23 DIAGNOSIS — Z7189 Other specified counseling: Secondary | ICD-10-CM | POA: Diagnosis not present

## 2023-03-23 DIAGNOSIS — Z1211 Encounter for screening for malignant neoplasm of colon: Secondary | ICD-10-CM | POA: Diagnosis not present

## 2023-03-23 DIAGNOSIS — Z8673 Personal history of transient ischemic attack (TIA), and cerebral infarction without residual deficits: Secondary | ICD-10-CM | POA: Diagnosis not present

## 2023-03-23 DIAGNOSIS — S73004D Unspecified dislocation of right hip, subsequent encounter: Secondary | ICD-10-CM | POA: Diagnosis not present

## 2023-03-23 DIAGNOSIS — E785 Hyperlipidemia, unspecified: Secondary | ICD-10-CM | POA: Diagnosis not present

## 2023-03-23 DIAGNOSIS — Z87891 Personal history of nicotine dependence: Secondary | ICD-10-CM | POA: Diagnosis not present

## 2023-03-23 DIAGNOSIS — Z9089 Acquired absence of other organs: Secondary | ICD-10-CM | POA: Diagnosis not present

## 2023-03-23 DIAGNOSIS — L89321 Pressure ulcer of left buttock, stage 1: Secondary | ICD-10-CM | POA: Diagnosis not present

## 2023-03-23 DIAGNOSIS — Z7982 Long term (current) use of aspirin: Secondary | ICD-10-CM | POA: Diagnosis not present

## 2023-03-23 DIAGNOSIS — K219 Gastro-esophageal reflux disease without esophagitis: Secondary | ICD-10-CM | POA: Diagnosis not present

## 2023-03-26 DIAGNOSIS — Z87891 Personal history of nicotine dependence: Secondary | ICD-10-CM | POA: Diagnosis not present

## 2023-03-26 DIAGNOSIS — Z7982 Long term (current) use of aspirin: Secondary | ICD-10-CM | POA: Diagnosis not present

## 2023-03-26 DIAGNOSIS — R634 Abnormal weight loss: Secondary | ICD-10-CM | POA: Diagnosis not present

## 2023-03-26 DIAGNOSIS — Z87442 Personal history of urinary calculi: Secondary | ICD-10-CM | POA: Diagnosis not present

## 2023-03-26 DIAGNOSIS — Z9842 Cataract extraction status, left eye: Secondary | ICD-10-CM | POA: Diagnosis not present

## 2023-03-26 DIAGNOSIS — Z9089 Acquired absence of other organs: Secondary | ICD-10-CM | POA: Diagnosis not present

## 2023-03-26 DIAGNOSIS — N3281 Overactive bladder: Secondary | ICD-10-CM | POA: Diagnosis not present

## 2023-03-26 DIAGNOSIS — D509 Iron deficiency anemia, unspecified: Secondary | ICD-10-CM | POA: Diagnosis not present

## 2023-03-26 DIAGNOSIS — Z9841 Cataract extraction status, right eye: Secondary | ICD-10-CM | POA: Diagnosis not present

## 2023-03-26 DIAGNOSIS — M1990 Unspecified osteoarthritis, unspecified site: Secondary | ICD-10-CM | POA: Diagnosis not present

## 2023-03-26 DIAGNOSIS — Z8673 Personal history of transient ischemic attack (TIA), and cerebral infarction without residual deficits: Secondary | ICD-10-CM | POA: Diagnosis not present

## 2023-03-26 DIAGNOSIS — E785 Hyperlipidemia, unspecified: Secondary | ICD-10-CM | POA: Diagnosis not present

## 2023-03-26 DIAGNOSIS — Z13228 Encounter for screening for other metabolic disorders: Secondary | ICD-10-CM | POA: Diagnosis not present

## 2023-03-26 DIAGNOSIS — E871 Hypo-osmolality and hyponatremia: Secondary | ICD-10-CM | POA: Diagnosis not present

## 2023-03-26 DIAGNOSIS — L89321 Pressure ulcer of left buttock, stage 1: Secondary | ICD-10-CM | POA: Diagnosis not present

## 2023-03-26 DIAGNOSIS — Z681 Body mass index (BMI) 19 or less, adult: Secondary | ICD-10-CM | POA: Diagnosis not present

## 2023-03-26 DIAGNOSIS — Z96641 Presence of right artificial hip joint: Secondary | ICD-10-CM | POA: Diagnosis not present

## 2023-03-26 DIAGNOSIS — M452 Ankylosing spondylitis of cervical region: Secondary | ICD-10-CM | POA: Diagnosis not present

## 2023-03-26 DIAGNOSIS — K219 Gastro-esophageal reflux disease without esophagitis: Secondary | ICD-10-CM | POA: Diagnosis not present

## 2023-03-26 DIAGNOSIS — L89891 Pressure ulcer of other site, stage 1: Secondary | ICD-10-CM | POA: Diagnosis not present

## 2023-03-26 DIAGNOSIS — Z7902 Long term (current) use of antithrombotics/antiplatelets: Secondary | ICD-10-CM | POA: Diagnosis not present

## 2023-03-29 DIAGNOSIS — R634 Abnormal weight loss: Secondary | ICD-10-CM | POA: Diagnosis not present

## 2023-03-29 DIAGNOSIS — E785 Hyperlipidemia, unspecified: Secondary | ICD-10-CM | POA: Diagnosis not present

## 2023-03-29 DIAGNOSIS — Z87891 Personal history of nicotine dependence: Secondary | ICD-10-CM | POA: Diagnosis not present

## 2023-03-29 DIAGNOSIS — Z7902 Long term (current) use of antithrombotics/antiplatelets: Secondary | ICD-10-CM | POA: Diagnosis not present

## 2023-03-29 DIAGNOSIS — Z87442 Personal history of urinary calculi: Secondary | ICD-10-CM | POA: Diagnosis not present

## 2023-03-29 DIAGNOSIS — Z9089 Acquired absence of other organs: Secondary | ICD-10-CM | POA: Diagnosis not present

## 2023-03-29 DIAGNOSIS — M452 Ankylosing spondylitis of cervical region: Secondary | ICD-10-CM | POA: Diagnosis not present

## 2023-03-29 DIAGNOSIS — L89321 Pressure ulcer of left buttock, stage 1: Secondary | ICD-10-CM | POA: Diagnosis not present

## 2023-03-29 DIAGNOSIS — Z7982 Long term (current) use of aspirin: Secondary | ICD-10-CM | POA: Diagnosis not present

## 2023-03-29 DIAGNOSIS — Z9841 Cataract extraction status, right eye: Secondary | ICD-10-CM | POA: Diagnosis not present

## 2023-03-29 DIAGNOSIS — Z681 Body mass index (BMI) 19 or less, adult: Secondary | ICD-10-CM | POA: Diagnosis not present

## 2023-03-29 DIAGNOSIS — Z96641 Presence of right artificial hip joint: Secondary | ICD-10-CM | POA: Diagnosis not present

## 2023-03-29 DIAGNOSIS — Z8673 Personal history of transient ischemic attack (TIA), and cerebral infarction without residual deficits: Secondary | ICD-10-CM | POA: Diagnosis not present

## 2023-03-29 DIAGNOSIS — Z9842 Cataract extraction status, left eye: Secondary | ICD-10-CM | POA: Diagnosis not present

## 2023-03-29 DIAGNOSIS — K219 Gastro-esophageal reflux disease without esophagitis: Secondary | ICD-10-CM | POA: Diagnosis not present

## 2023-03-29 DIAGNOSIS — N3281 Overactive bladder: Secondary | ICD-10-CM | POA: Diagnosis not present

## 2023-03-29 DIAGNOSIS — L89891 Pressure ulcer of other site, stage 1: Secondary | ICD-10-CM | POA: Diagnosis not present

## 2023-03-29 DIAGNOSIS — E871 Hypo-osmolality and hyponatremia: Secondary | ICD-10-CM | POA: Diagnosis not present

## 2023-03-29 DIAGNOSIS — M1990 Unspecified osteoarthritis, unspecified site: Secondary | ICD-10-CM | POA: Diagnosis not present

## 2023-03-29 DIAGNOSIS — D509 Iron deficiency anemia, unspecified: Secondary | ICD-10-CM | POA: Diagnosis not present

## 2023-03-31 DIAGNOSIS — D509 Iron deficiency anemia, unspecified: Secondary | ICD-10-CM | POA: Diagnosis not present

## 2023-03-31 DIAGNOSIS — E871 Hypo-osmolality and hyponatremia: Secondary | ICD-10-CM | POA: Diagnosis not present

## 2023-03-31 DIAGNOSIS — Z9089 Acquired absence of other organs: Secondary | ICD-10-CM | POA: Diagnosis not present

## 2023-03-31 DIAGNOSIS — Z8673 Personal history of transient ischemic attack (TIA), and cerebral infarction without residual deficits: Secondary | ICD-10-CM | POA: Diagnosis not present

## 2023-03-31 DIAGNOSIS — Z681 Body mass index (BMI) 19 or less, adult: Secondary | ICD-10-CM | POA: Diagnosis not present

## 2023-03-31 DIAGNOSIS — L89321 Pressure ulcer of left buttock, stage 1: Secondary | ICD-10-CM | POA: Diagnosis not present

## 2023-03-31 DIAGNOSIS — K219 Gastro-esophageal reflux disease without esophagitis: Secondary | ICD-10-CM | POA: Diagnosis not present

## 2023-03-31 DIAGNOSIS — Z7902 Long term (current) use of antithrombotics/antiplatelets: Secondary | ICD-10-CM | POA: Diagnosis not present

## 2023-03-31 DIAGNOSIS — Z87442 Personal history of urinary calculi: Secondary | ICD-10-CM | POA: Diagnosis not present

## 2023-03-31 DIAGNOSIS — N3281 Overactive bladder: Secondary | ICD-10-CM | POA: Diagnosis not present

## 2023-03-31 DIAGNOSIS — L89891 Pressure ulcer of other site, stage 1: Secondary | ICD-10-CM | POA: Diagnosis not present

## 2023-03-31 DIAGNOSIS — Z7982 Long term (current) use of aspirin: Secondary | ICD-10-CM | POA: Diagnosis not present

## 2023-03-31 DIAGNOSIS — E785 Hyperlipidemia, unspecified: Secondary | ICD-10-CM | POA: Diagnosis not present

## 2023-03-31 DIAGNOSIS — Z96641 Presence of right artificial hip joint: Secondary | ICD-10-CM | POA: Diagnosis not present

## 2023-03-31 DIAGNOSIS — Z87891 Personal history of nicotine dependence: Secondary | ICD-10-CM | POA: Diagnosis not present

## 2023-03-31 DIAGNOSIS — R634 Abnormal weight loss: Secondary | ICD-10-CM | POA: Diagnosis not present

## 2023-03-31 DIAGNOSIS — M1990 Unspecified osteoarthritis, unspecified site: Secondary | ICD-10-CM | POA: Diagnosis not present

## 2023-03-31 DIAGNOSIS — Z9841 Cataract extraction status, right eye: Secondary | ICD-10-CM | POA: Diagnosis not present

## 2023-03-31 DIAGNOSIS — Z9842 Cataract extraction status, left eye: Secondary | ICD-10-CM | POA: Diagnosis not present

## 2023-03-31 DIAGNOSIS — M452 Ankylosing spondylitis of cervical region: Secondary | ICD-10-CM | POA: Diagnosis not present

## 2023-04-01 ENCOUNTER — Inpatient Hospital Stay: Payer: Medicare Other | Admitting: Hematology and Oncology

## 2023-04-01 ENCOUNTER — Inpatient Hospital Stay: Payer: Medicare Other | Attending: Internal Medicine

## 2023-04-01 NOTE — Progress Notes (Deleted)
Steven [name] [phone]   [phone]  PROGRESS NOTE  Patient Care Team: [name], MD as PCP - General (Internal Medicine) Jaci Standard, MD as Consulting Physician (Hematology and Oncology)  Hematological/Oncological History #Normocytic Anemia #Iron Deficiency Anemia 1) 01/01/2017: WBC 5.1, Hgb 11.0, Plt 254, MCV 91.6 2) 12/07/2017: WBC 8.0, Hgb 11.9, Plt 352, MCV 91.4. Iron 76, TIBC 359, Sat 21%, Ferritin 31 3) 03/09/2019: WBC 14.3, Hgb 11.1, Plt 374, MCV 88.8 4) 05/22/2019: WBC 5.5, Hgb 10.7, Plt 311, MCV 83.7. Routine PCP visit. 5) 06/14/2019: establish care with Dr. Leonides Schanz. WBC 5.0, Hgb 10.3, MCV 84.9, Plt 320 6) 09/14/2019: WBC 6.2, Hgb 10.9, MCV 91.2, Plt 209 7)  11/01/2019: WBC 9.2, Hgb 12.6, MVC 97.2, Plt 299 8) 11/05/2019: patient admitted with SBO, instructed to stop PO iron indefinitely.  9) 02/28/2020: admitted with SBO, Hgb drop to 9.6 while inpatient 10) 05/17/2020: Hgb 11.7, increased without intervention.  11) 09/05/2020: WBC 6.6, Hgb 10.9, MCV 94.6, Plt 259 12) 6/6-6/17/2022: IV feraheme 510 mg x 2 doses  13) 6/21-6/30/2023: IV feraheme 510 mg x 2 doses  14) 02/20/2022: WBC 5.8, Hgb 11.5, MCV 98.5, Plt 262  Interval History:  Steven [name] 88 y.o. male with medical history significant for iron deficiency anemia who presents for a follow up visit. The patient's last visit was on 02/20/2022. In the interim since the last visit Steven [name] been at his baseline level of health.   On exam today Steven [name] that everything [name] been good other than "the worst episode of my life".  He had an episode where he had norovirus and a "knock him right out".  He ended up in the hospital for 3 days and took him 2 weeks to fully recover.  He had awful nausea,, diarrhea and was severely dehydrated.  He reports that he [name] begun to rebound from this recent illness.  He [name] his energy right now is about a 7.5 out of 10.  He reports that he [name] been  doing his best to try to walk is much as he can to try to build up his strength and energy.  He is also eating quite well but is concerned that his legs get weak when he exercises too much.  He continues take his iron pill without difficulty, without any constipation, nausea. He denies having fevers, chills, sweats, nausea, vomiting or diarrhea.  Otherwise a full 10 point ROS is listed below.   MEDICAL HISTORY:  Past Medical History:  Diagnosis Date   Allergy    enviromental   Anemia 2017   Anxiety    Arthritis    ankylosing spodilitis   Blood transfusion without reported diagnosis    during surgery   Cataract    Depression    Dyspnea    with exertion - had Echo done 09/29/16   History of kidney stones    Hyperlipidemia    Intestinal obstruction (HCC)    Pneumonia    as a child    SURGICAL HISTORY: Past Surgical History:  Procedure Laterality Date   ABDOMINAL SURGERY     had abcess   APPENDECTOMY     CHOLECYSTECTOMY     with lysis of adhesions   COLONOSCOPY     COLOSTOMY     COLOSTOMY REVERSAL     EYE SURGERY Left    scar tissue removed from cornea   EYE SURGERY Right    cataract surgery with lens implant  JOINT REPLACEMENT Right    hip  x 2 1999 and 2007   SHOULDER ARTHROSCOPY WITH ROTATOR CUFF REPAIR AND SUBACROMIAL DECOMPRESSION Left 03/02/2013   Procedure: LEFT SHOULDER ARTHROSCOPY WITH SUBACROMIAL DECOMPRESSION DISTAL CALVICLE RESECTION AND ROTATOR CUFF REPAIR ;  Surgeon: Senaida Lange, MD;  Location: [facility] OR;  Service: Orthopedics;  Laterality: Left;   TOTAL HIP REVISION Right 11/06/2016   Procedure: TOTAL HIP REVISION OF THE ACETABULAR COMPONENT;  Surgeon: Gean Birchwood, MD;  Location: [facility] OR;  Service: Orthopedics;  Laterality: Right;   TRANSCAROTID ARTERY REVASCULARIZATION  Left 03/31/2021   Procedure: TRANSCAROTID ARTERY REVASCULARIZATION;  Surgeon: Victorino Sparrow, MD;  Location[facility];  Service: Vascular;  Laterality: Left;   ULTRASOUND GUIDANCE FOR VASCULAR  ACCESS Right 03/31/2021   Procedure: ULTRASOUND GUIDANCE FOR VASCULAR ACCESS;  Surgeon: Victorino Sparrow, MD;  Location: [name] OR;  Service: Vascular;  Laterality: Right;    SOCIAL HISTORY: Social History   Socioeconomic History   Marital status: Married    Spouse name: Not on file   Number of children: 2   Years of education: Not on file   Highest education level: Not on file  Occupational History   Occupation: retired  Tobacco Use   Smoking status: Former    Current packs/day: 0.00    Average packs/day: 1 pack/day for 9.0 years (9.0 ttl pk-yrs)    Types: Cigarettes    Start date: 04/13/1956    Quit date: 04/13/1965    Years since quitting: 58.0    Passive exposure: Never   Smokeless tobacco: Never  Vaping Use   Vaping status: Never Used  Substance and Sexual Activity   Alcohol use: Yes    Comment: 3 beers or glasses of wine a day--reports stopped ETOH 11/01/16    Drug use: No   Sexual activity: Never  Other Topics Concern   Not on file  Social History Narrative   Not on file   Social Drivers of Health   Financial Resource Strain: Low Risk  (03/09/2019)   Overall Financial Resource Strain (CARDIA)    Difficulty of Paying Living Expenses: Not hard at all  Food Insecurity: No Food Insecurity (03/17/2023)   Hunger Vital Sign    Worried About Running Out of Food in the Last Year: Never true    Ran Out of Food in the Last Year: Never true  Transportation Needs: No Transportation Needs (03/17/2023)   PRAPARE - Administrator, Civil Service (Medical): No    Lack of Transportation (Non-Medical): No  Physical Activity: Insufficiently Active (03/09/2019)   Exercise Vital Sign    Days of Exercise per Week: 5 days    Minutes of Exercise per Session: 10 min  Stress: No Stress Concern Present (03/09/2019)   [employer] of Occupational Health - Occupational Stress Questionnaire    Feeling of Stress : Not at all  Social Connections: Unknown (03/09/2019)   Social  Connection and Isolation Panel [NHANES]    Frequency of Communication with Friends and Family: Patient declined    Frequency of Social Gatherings with Friends and Family: Patient declined    Attends Religious Services: Patient declined    Database administrator or Organizations: Patient declined    Attends Banker Meetings: Patient declined    Marital Status: Patient declined  Intimate Partner Violence: Not At Risk (03/17/2023)   Humiliation, Afraid, Rape, and Kick questionnaire    Fear of Current or Ex-Partner: No    Emotionally Abused: No  Physically Abused: No    Sexually Abused: No    FAMILY HISTORY: Family History  Problem Relation Age of Onset   Heart attack Mother    Diabetes Mother    Breast cancer Mother    Heart attack Maternal Grandfather    Pancreatic cancer Brother    Colon cancer Neg Hx    Esophageal cancer Neg Hx    Stomach cancer Neg Hx     ALLERGIES:  is allergic to indomethacin and lactose intolerance (gi).  MEDICATIONS:  Current Outpatient Medications  Medication Sig Dispense Refill   acetaminophen (TYLENOL) 325 MG tablet Take 325-650 mg by mouth every 6 (six) hours as needed for mild pain (pain score 1-3) or headache.     Artificial Tear Solution (SOOTHE XP) SOLN Place 1 drop into both eyes 2 (two) times daily as needed (for dryness).     aspirin EC 325 MG EC tablet Take 1 tablet (325 mg total) by mouth daily. 30 tablet 1   Biotin 5000 MCG TABS Take 5,000 mcg by mouth daily.     buPROPion (WELLBUTRIN XL) 150 MG 24 hr tablet Take 150 mg by mouth daily.     Calcium Carb-Cholecalciferol (CALCIUM+D3 PO) Take 2 tablets by mouth 2 (two) times daily with a meal.     Calcium Citrate 250 MG TABS Take 250 mg by mouth daily.     Cholecalciferol (VITAMIN D3) 1000 units CAPS Take 2,000 Units by mouth daily.     clopidogrel (PLAVIX) 75 MG tablet Take 1 tablet (75 mg total) by mouth daily. (Patient taking differently: Take 75 mg by mouth at bedtime.) 30  tablet 1   cyanocobalamin (VITAMIN B12) 1000 MCG tablet Take 1,000 mcg by mouth daily.     fluoruracil (CARAC) 0.5 % cream Apply 1 application  topically daily as needed (for cancer treatment- as directed to affected areas).     folic acid (FOLVITE) 1 MG tablet Take 1 mg by mouth in the morning and at bedtime.     GEMTESA 75 MG TABS Take 75 mg by mouth at bedtime.     ipratropium (ATROVENT) 0.06 % nasal spray Place 2 sprays into both nostrils 2 (two) times daily as needed (allergies).     lactose free nutrition (BOOST) LIQD Take 237 mLs by mouth 2 (two) times daily between meals.     melatonin 5 MG TABS Take 5 mg by mouth at bedtime.     methotrexate (RHEUMATREX) 2.5 MG tablet Take 20 mg by mouth every Sunday.     Multiple Vitamins-Minerals (PRESERVISION AREDS 2 PO) Take 1 capsule by mouth in the morning and at bedtime.     oxyCODONE (ROXICODONE) 5 MG immediate release tablet Take 1 tablet (5 mg total) by mouth every 6 (six) hours as needed for severe pain (pain score 7-10). 10 tablet 0   pantoprazole (PROTONIX) 40 MG tablet Take 1 tablet (40 mg total) by mouth daily. (Patient taking differently: Take 40 mg by mouth daily before breakfast.) 30 tablet 1   polyethylene glycol (MIRALAX / GLYCOLAX) 17 g packet Take 17 g by mouth daily.     rosuvastatin (CRESTOR) 20 MG tablet Take 1 tablet (20 mg total) by mouth at bedtime. 30 tablet 1   senna-docusate (SENOKOT-S) 8.6-50 MG tablet Take 1 tablet by mouth daily.     sodium chloride 1 g tablet Take 2 tablets (2 g total) by mouth 3 (three) times daily with meals. 180 tablet 0   No current facility-administered medications for  this visit.    REVIEW OF SYSTEMS:   Constitutional: ( - ) fevers, ( - )  chills , ( - ) night sweats Eyes: ( - ) blurriness of vision, ( - ) double vision, ( - ) watery eyes Ears, nose, mouth, throat, and face: ( - ) mucositis, ( - ) sore throat Respiratory: ( - ) cough, ( - ) dyspnea, ( - ) wheezes Cardiovascular: ( - )  palpitation, ( - ) chest discomfort, ( - ) lower extremity swelling Gastrointestinal:  ( - ) nausea, ( - ) heartburn, ( - ) change in bowel habits Skin: ( - ) abnormal skin rashes Lymphatics: ( - ) new lymphadenopathy, ( - ) easy bruising Neurological: ( - ) numbness, ( - ) tingling, ( - ) new weaknesses Behavioral/Psych: ( - ) mood change, ( - ) new changes  All other systems were reviewed with the patient and are negative.  PHYSICAL EXAMINATION: ECOG PERFORMANCE STATUS: 1 - Symptomatic but completely ambulatory  There were no vitals filed for this visit.  There were no vitals filed for this visit.   GENERAL: well appearing elderly Caucasian male in NAD  SKIN: skin color, texture, turgor are normal, no rashes or significant lesions EYES: conjunctiva are pink and non-injected, sclera clear LUNGS: clear to auscultation and percussion with normal breathing effort HEART: regular rate & rhythm and no murmurs and no lower extremity edema ABDOMEN: soft, non-tender, non-distended, normal bowel sounds. No HSM appreciated. Musculoskeletal: no cyanosis of digits and no clubbing  PSYCH: alert & oriented x 3, fluent speech NEURO: no focal motor/sensory deficits  LABORATORY DATA:  I have reviewed the data as listed    Latest Ref Rng & Units 03/21/2023    5:19 AM 03/20/2023    4:36 PM 03/17/2023    5:45 AM  CBC  WBC 4.0 - 10.5 K/uL 7.3  6.7  5.5   Hemoglobin 13.0 - 17.0 g/dL 9.8  62.1  9.8   Hematocrit 39.0 - 52.0 % 28.6  31.8  28.0   Platelets 150 - 400 K/uL 266  329  300        Latest Ref Rng & Units 03/22/2023    5:03 AM 03/21/2023    5:19 AM 03/20/2023    5:20 AM  CMP  Glucose 70 - 99 mg/dL 83  91  83   BUN 8 - 23 mg/dL 12  11  9    Creatinine 0.61 - 1.24 mg/dL 3.08  6.57  8.46   Sodium 135 - 145 mmol/L 127  127  124   Potassium 3.5 - 5.1 mmol/L 3.3  3.5  3.4   Chloride 98 - 111 mmol/L 96  96  93   CO2 22 - 32 mmol/L 22  25  23    Calcium 8.9 - 10.3 mg/dL 8.1  8.3  8.4     No  results found for: "MPROTEIN" Lab Results  Component Value Date   KPAFRELGTCHN 16.5 06/14/2019   LAMBDASER 14.3 06/14/2019   KAPLAMBRATIO 1.15 06/14/2019    RADIOGRAPHIC STUDIES: DG Chest 2 View Result Date: 03/20/2023 CLINICAL DATA:  Hyponatremia.  Low-sodium levels.  Cough.  Weakness. EXAM: CHEST - 2 VIEW COMPARISON:  02/19/2023 FINDINGS: Hyperinflation. Nonacute left rib fractures. Midline trachea. Normal heart size. Atherosclerosis in the transverse aorta. No pleural effusion or pneumothorax. New or increased medial right lower lobe opacity IMPRESSION: New or increased medial right lower lobe opacity, favoring pneumonia over atelectasis. Underlying hyperinflation suggests COPD. Aortic Atherosclerosis (ICD10-I70.0).  Electronically Signed   By: Jeronimo Greaves M.D.   On: 03/20/2023 12:26    ASSESSMENT & PLAN Steven [name] 88 y.o. male with medical history significant for iron deficiency anemia who presents for a follow up visit.  After review of the labs and discussion with the patient the findings are most consistent with continued iron deficiency anemia.  GI evaluations: EGD 03/11/2017: normal stomach and esophagus. Mild duodenal stenosis Colonoscopy 11/17/2013: moderate diverticulosis  At this time there is concern for recurrent GI bleed.  His hemoglobin [name] trended downward from 11.4 on 06/03/2021 to 8.2 on 09/25/2021.  GI loss is the most likely etiology.  He [name] been evaluated by gastroenterology who noted that endoscopic intervention is not indicated at this time.  #Normocytic Anemia # Iron Deficiency Anemia of Unclear Etiology -- Today hemoglobin 12.0, WBC 6.8, MCV 97.9, Plt 314 --previously patient [name] to stop his ferrous sulfate 325mg  PO daily due to recurrent SBOs, as recommended by his surgical team.  --Patient [name] restarted his ferrous sulfate 325 mg p.o. daily and is tolerating it well. --repeat Iron panel, ferritin, and reticulocyte panel today.  --will recommend IV iron to the  patient if iron levels remain low  --Steven [name] noted he had negative FOB cards and was evaluated by gastroenterology who declined to perform endoscopic intervention. --RTC 6 months   No orders of the defined types were placed in this encounter.  All questions were answered. The patient knows to call the clinic with any problems, questions or concerns.  A total of more than 30 minutes were spent on this encounter and over half of that time was spent on counseling and coordination of care as outlined above.   Ulysees Barns, MD Department of Hematology/Oncology [name] Phone: [phone] Pager: [phone] Email: [email or web address]  04/01/2023 1:39 PM

## 2023-04-02 DIAGNOSIS — N3281 Overactive bladder: Secondary | ICD-10-CM | POA: Diagnosis not present

## 2023-04-02 DIAGNOSIS — Z9842 Cataract extraction status, left eye: Secondary | ICD-10-CM | POA: Diagnosis not present

## 2023-04-02 DIAGNOSIS — Z9089 Acquired absence of other organs: Secondary | ICD-10-CM | POA: Diagnosis not present

## 2023-04-02 DIAGNOSIS — M1990 Unspecified osteoarthritis, unspecified site: Secondary | ICD-10-CM | POA: Diagnosis not present

## 2023-04-02 DIAGNOSIS — M452 Ankylosing spondylitis of cervical region: Secondary | ICD-10-CM | POA: Diagnosis not present

## 2023-04-02 DIAGNOSIS — Z96641 Presence of right artificial hip joint: Secondary | ICD-10-CM | POA: Diagnosis not present

## 2023-04-02 DIAGNOSIS — Z9841 Cataract extraction status, right eye: Secondary | ICD-10-CM | POA: Diagnosis not present

## 2023-04-02 DIAGNOSIS — L89321 Pressure ulcer of left buttock, stage 1: Secondary | ICD-10-CM | POA: Diagnosis not present

## 2023-04-02 DIAGNOSIS — Z8673 Personal history of transient ischemic attack (TIA), and cerebral infarction without residual deficits: Secondary | ICD-10-CM | POA: Diagnosis not present

## 2023-04-02 DIAGNOSIS — Z7982 Long term (current) use of aspirin: Secondary | ICD-10-CM | POA: Diagnosis not present

## 2023-04-02 DIAGNOSIS — K219 Gastro-esophageal reflux disease without esophagitis: Secondary | ICD-10-CM | POA: Diagnosis not present

## 2023-04-02 DIAGNOSIS — Z7902 Long term (current) use of antithrombotics/antiplatelets: Secondary | ICD-10-CM | POA: Diagnosis not present

## 2023-04-02 DIAGNOSIS — R634 Abnormal weight loss: Secondary | ICD-10-CM | POA: Diagnosis not present

## 2023-04-02 DIAGNOSIS — E871 Hypo-osmolality and hyponatremia: Secondary | ICD-10-CM | POA: Diagnosis not present

## 2023-04-02 DIAGNOSIS — Z87442 Personal history of urinary calculi: Secondary | ICD-10-CM | POA: Diagnosis not present

## 2023-04-02 DIAGNOSIS — L89891 Pressure ulcer of other site, stage 1: Secondary | ICD-10-CM | POA: Diagnosis not present

## 2023-04-02 DIAGNOSIS — D509 Iron deficiency anemia, unspecified: Secondary | ICD-10-CM | POA: Diagnosis not present

## 2023-04-02 DIAGNOSIS — Z87891 Personal history of nicotine dependence: Secondary | ICD-10-CM | POA: Diagnosis not present

## 2023-04-02 DIAGNOSIS — Z681 Body mass index (BMI) 19 or less, adult: Secondary | ICD-10-CM | POA: Diagnosis not present

## 2023-04-02 DIAGNOSIS — E785 Hyperlipidemia, unspecified: Secondary | ICD-10-CM | POA: Diagnosis not present

## 2023-04-05 DIAGNOSIS — Z7902 Long term (current) use of antithrombotics/antiplatelets: Secondary | ICD-10-CM | POA: Diagnosis not present

## 2023-04-05 DIAGNOSIS — Z87891 Personal history of nicotine dependence: Secondary | ICD-10-CM | POA: Diagnosis not present

## 2023-04-05 DIAGNOSIS — Z96641 Presence of right artificial hip joint: Secondary | ICD-10-CM | POA: Diagnosis not present

## 2023-04-05 DIAGNOSIS — Z7982 Long term (current) use of aspirin: Secondary | ICD-10-CM | POA: Diagnosis not present

## 2023-04-05 DIAGNOSIS — Z9841 Cataract extraction status, right eye: Secondary | ICD-10-CM | POA: Diagnosis not present

## 2023-04-05 DIAGNOSIS — E871 Hypo-osmolality and hyponatremia: Secondary | ICD-10-CM | POA: Diagnosis not present

## 2023-04-05 DIAGNOSIS — Z8673 Personal history of transient ischemic attack (TIA), and cerebral infarction without residual deficits: Secondary | ICD-10-CM | POA: Diagnosis not present

## 2023-04-05 DIAGNOSIS — Z9089 Acquired absence of other organs: Secondary | ICD-10-CM | POA: Diagnosis not present

## 2023-04-05 DIAGNOSIS — K219 Gastro-esophageal reflux disease without esophagitis: Secondary | ICD-10-CM | POA: Diagnosis not present

## 2023-04-05 DIAGNOSIS — D509 Iron deficiency anemia, unspecified: Secondary | ICD-10-CM | POA: Diagnosis not present

## 2023-04-05 DIAGNOSIS — Z681 Body mass index (BMI) 19 or less, adult: Secondary | ICD-10-CM | POA: Diagnosis not present

## 2023-04-05 DIAGNOSIS — N3281 Overactive bladder: Secondary | ICD-10-CM | POA: Diagnosis not present

## 2023-04-05 DIAGNOSIS — E785 Hyperlipidemia, unspecified: Secondary | ICD-10-CM | POA: Diagnosis not present

## 2023-04-05 DIAGNOSIS — M1990 Unspecified osteoarthritis, unspecified site: Secondary | ICD-10-CM | POA: Diagnosis not present

## 2023-04-05 DIAGNOSIS — L89321 Pressure ulcer of left buttock, stage 1: Secondary | ICD-10-CM | POA: Diagnosis not present

## 2023-04-05 DIAGNOSIS — Z87442 Personal history of urinary calculi: Secondary | ICD-10-CM | POA: Diagnosis not present

## 2023-04-05 DIAGNOSIS — Z9842 Cataract extraction status, left eye: Secondary | ICD-10-CM | POA: Diagnosis not present

## 2023-04-05 DIAGNOSIS — L89891 Pressure ulcer of other site, stage 1: Secondary | ICD-10-CM | POA: Diagnosis not present

## 2023-04-05 DIAGNOSIS — R634 Abnormal weight loss: Secondary | ICD-10-CM | POA: Diagnosis not present

## 2023-04-05 DIAGNOSIS — M452 Ankylosing spondylitis of cervical region: Secondary | ICD-10-CM | POA: Diagnosis not present

## 2023-04-06 DIAGNOSIS — M1990 Unspecified osteoarthritis, unspecified site: Secondary | ICD-10-CM | POA: Diagnosis not present

## 2023-04-06 DIAGNOSIS — Z96641 Presence of right artificial hip joint: Secondary | ICD-10-CM | POA: Diagnosis not present

## 2023-04-06 DIAGNOSIS — E871 Hypo-osmolality and hyponatremia: Secondary | ICD-10-CM | POA: Diagnosis not present

## 2023-04-06 DIAGNOSIS — Z8673 Personal history of transient ischemic attack (TIA), and cerebral infarction without residual deficits: Secondary | ICD-10-CM | POA: Diagnosis not present

## 2023-04-06 DIAGNOSIS — K219 Gastro-esophageal reflux disease without esophagitis: Secondary | ICD-10-CM | POA: Diagnosis not present

## 2023-04-06 DIAGNOSIS — D509 Iron deficiency anemia, unspecified: Secondary | ICD-10-CM | POA: Diagnosis not present

## 2023-04-06 DIAGNOSIS — L89321 Pressure ulcer of left buttock, stage 1: Secondary | ICD-10-CM | POA: Diagnosis not present

## 2023-04-06 DIAGNOSIS — Z681 Body mass index (BMI) 19 or less, adult: Secondary | ICD-10-CM | POA: Diagnosis not present

## 2023-04-06 DIAGNOSIS — L89891 Pressure ulcer of other site, stage 1: Secondary | ICD-10-CM | POA: Diagnosis not present

## 2023-04-06 DIAGNOSIS — N3281 Overactive bladder: Secondary | ICD-10-CM | POA: Diagnosis not present

## 2023-04-06 DIAGNOSIS — Z7902 Long term (current) use of antithrombotics/antiplatelets: Secondary | ICD-10-CM | POA: Diagnosis not present

## 2023-04-06 DIAGNOSIS — Z9842 Cataract extraction status, left eye: Secondary | ICD-10-CM | POA: Diagnosis not present

## 2023-04-06 DIAGNOSIS — Z87442 Personal history of urinary calculi: Secondary | ICD-10-CM | POA: Diagnosis not present

## 2023-04-06 DIAGNOSIS — Z9841 Cataract extraction status, right eye: Secondary | ICD-10-CM | POA: Diagnosis not present

## 2023-04-06 DIAGNOSIS — Z7982 Long term (current) use of aspirin: Secondary | ICD-10-CM | POA: Diagnosis not present

## 2023-04-06 DIAGNOSIS — R634 Abnormal weight loss: Secondary | ICD-10-CM | POA: Diagnosis not present

## 2023-04-06 DIAGNOSIS — Z87891 Personal history of nicotine dependence: Secondary | ICD-10-CM | POA: Diagnosis not present

## 2023-04-06 DIAGNOSIS — M452 Ankylosing spondylitis of cervical region: Secondary | ICD-10-CM | POA: Diagnosis not present

## 2023-04-06 DIAGNOSIS — E785 Hyperlipidemia, unspecified: Secondary | ICD-10-CM | POA: Diagnosis not present

## 2023-04-06 DIAGNOSIS — Z9089 Acquired absence of other organs: Secondary | ICD-10-CM | POA: Diagnosis not present

## 2023-04-08 DIAGNOSIS — Z8673 Personal history of transient ischemic attack (TIA), and cerebral infarction without residual deficits: Secondary | ICD-10-CM | POA: Diagnosis not present

## 2023-04-08 DIAGNOSIS — Z96641 Presence of right artificial hip joint: Secondary | ICD-10-CM | POA: Diagnosis not present

## 2023-04-08 DIAGNOSIS — K219 Gastro-esophageal reflux disease without esophagitis: Secondary | ICD-10-CM | POA: Diagnosis not present

## 2023-04-08 DIAGNOSIS — Z87442 Personal history of urinary calculi: Secondary | ICD-10-CM | POA: Diagnosis not present

## 2023-04-08 DIAGNOSIS — L89891 Pressure ulcer of other site, stage 1: Secondary | ICD-10-CM | POA: Diagnosis not present

## 2023-04-08 DIAGNOSIS — Z7982 Long term (current) use of aspirin: Secondary | ICD-10-CM | POA: Diagnosis not present

## 2023-04-08 DIAGNOSIS — N3281 Overactive bladder: Secondary | ICD-10-CM | POA: Diagnosis not present

## 2023-04-08 DIAGNOSIS — Z87891 Personal history of nicotine dependence: Secondary | ICD-10-CM | POA: Diagnosis not present

## 2023-04-08 DIAGNOSIS — Z7902 Long term (current) use of antithrombotics/antiplatelets: Secondary | ICD-10-CM | POA: Diagnosis not present

## 2023-04-08 DIAGNOSIS — E785 Hyperlipidemia, unspecified: Secondary | ICD-10-CM | POA: Diagnosis not present

## 2023-04-08 DIAGNOSIS — L89321 Pressure ulcer of left buttock, stage 1: Secondary | ICD-10-CM | POA: Diagnosis not present

## 2023-04-08 DIAGNOSIS — Z9089 Acquired absence of other organs: Secondary | ICD-10-CM | POA: Diagnosis not present

## 2023-04-08 DIAGNOSIS — M1990 Unspecified osteoarthritis, unspecified site: Secondary | ICD-10-CM | POA: Diagnosis not present

## 2023-04-08 DIAGNOSIS — D509 Iron deficiency anemia, unspecified: Secondary | ICD-10-CM | POA: Diagnosis not present

## 2023-04-08 DIAGNOSIS — Z9841 Cataract extraction status, right eye: Secondary | ICD-10-CM | POA: Diagnosis not present

## 2023-04-08 DIAGNOSIS — M452 Ankylosing spondylitis of cervical region: Secondary | ICD-10-CM | POA: Diagnosis not present

## 2023-04-08 DIAGNOSIS — Z681 Body mass index (BMI) 19 or less, adult: Secondary | ICD-10-CM | POA: Diagnosis not present

## 2023-04-08 DIAGNOSIS — R634 Abnormal weight loss: Secondary | ICD-10-CM | POA: Diagnosis not present

## 2023-04-08 DIAGNOSIS — Z9842 Cataract extraction status, left eye: Secondary | ICD-10-CM | POA: Diagnosis not present

## 2023-04-08 DIAGNOSIS — E871 Hypo-osmolality and hyponatremia: Secondary | ICD-10-CM | POA: Diagnosis not present

## 2023-04-09 DIAGNOSIS — M452 Ankylosing spondylitis of cervical region: Secondary | ICD-10-CM | POA: Diagnosis not present

## 2023-04-09 DIAGNOSIS — Z9841 Cataract extraction status, right eye: Secondary | ICD-10-CM | POA: Diagnosis not present

## 2023-04-09 DIAGNOSIS — Z681 Body mass index (BMI) 19 or less, adult: Secondary | ICD-10-CM | POA: Diagnosis not present

## 2023-04-09 DIAGNOSIS — D509 Iron deficiency anemia, unspecified: Secondary | ICD-10-CM | POA: Diagnosis not present

## 2023-04-09 DIAGNOSIS — L89891 Pressure ulcer of other site, stage 1: Secondary | ICD-10-CM | POA: Diagnosis not present

## 2023-04-09 DIAGNOSIS — K219 Gastro-esophageal reflux disease without esophagitis: Secondary | ICD-10-CM | POA: Diagnosis not present

## 2023-04-09 DIAGNOSIS — E871 Hypo-osmolality and hyponatremia: Secondary | ICD-10-CM | POA: Diagnosis not present

## 2023-04-09 DIAGNOSIS — N3281 Overactive bladder: Secondary | ICD-10-CM | POA: Diagnosis not present

## 2023-04-09 DIAGNOSIS — M1990 Unspecified osteoarthritis, unspecified site: Secondary | ICD-10-CM | POA: Diagnosis not present

## 2023-04-09 DIAGNOSIS — Z87442 Personal history of urinary calculi: Secondary | ICD-10-CM | POA: Diagnosis not present

## 2023-04-09 DIAGNOSIS — E785 Hyperlipidemia, unspecified: Secondary | ICD-10-CM | POA: Diagnosis not present

## 2023-04-09 DIAGNOSIS — Z8673 Personal history of transient ischemic attack (TIA), and cerebral infarction without residual deficits: Secondary | ICD-10-CM | POA: Diagnosis not present

## 2023-04-09 DIAGNOSIS — Z9842 Cataract extraction status, left eye: Secondary | ICD-10-CM | POA: Diagnosis not present

## 2023-04-09 DIAGNOSIS — Z7982 Long term (current) use of aspirin: Secondary | ICD-10-CM | POA: Diagnosis not present

## 2023-04-09 DIAGNOSIS — R634 Abnormal weight loss: Secondary | ICD-10-CM | POA: Diagnosis not present

## 2023-04-09 DIAGNOSIS — Z7902 Long term (current) use of antithrombotics/antiplatelets: Secondary | ICD-10-CM | POA: Diagnosis not present

## 2023-04-09 DIAGNOSIS — Z87891 Personal history of nicotine dependence: Secondary | ICD-10-CM | POA: Diagnosis not present

## 2023-04-09 DIAGNOSIS — L89321 Pressure ulcer of left buttock, stage 1: Secondary | ICD-10-CM | POA: Diagnosis not present

## 2023-04-09 DIAGNOSIS — Z96641 Presence of right artificial hip joint: Secondary | ICD-10-CM | POA: Diagnosis not present

## 2023-04-09 DIAGNOSIS — Z9089 Acquired absence of other organs: Secondary | ICD-10-CM | POA: Diagnosis not present

## 2023-04-13 DIAGNOSIS — N3281 Overactive bladder: Secondary | ICD-10-CM | POA: Diagnosis not present

## 2023-04-13 DIAGNOSIS — E785 Hyperlipidemia, unspecified: Secondary | ICD-10-CM | POA: Diagnosis not present

## 2023-04-13 DIAGNOSIS — Z7902 Long term (current) use of antithrombotics/antiplatelets: Secondary | ICD-10-CM | POA: Diagnosis not present

## 2023-04-13 DIAGNOSIS — M452 Ankylosing spondylitis of cervical region: Secondary | ICD-10-CM | POA: Diagnosis not present

## 2023-04-13 DIAGNOSIS — M1990 Unspecified osteoarthritis, unspecified site: Secondary | ICD-10-CM | POA: Diagnosis not present

## 2023-04-13 DIAGNOSIS — L89891 Pressure ulcer of other site, stage 1: Secondary | ICD-10-CM | POA: Diagnosis not present

## 2023-04-13 DIAGNOSIS — K219 Gastro-esophageal reflux disease without esophagitis: Secondary | ICD-10-CM | POA: Diagnosis not present

## 2023-04-13 DIAGNOSIS — L89321 Pressure ulcer of left buttock, stage 1: Secondary | ICD-10-CM | POA: Diagnosis not present

## 2023-04-13 DIAGNOSIS — E871 Hypo-osmolality and hyponatremia: Secondary | ICD-10-CM | POA: Diagnosis not present

## 2023-04-13 DIAGNOSIS — Z87891 Personal history of nicotine dependence: Secondary | ICD-10-CM | POA: Diagnosis not present

## 2023-04-13 DIAGNOSIS — Z96641 Presence of right artificial hip joint: Secondary | ICD-10-CM | POA: Diagnosis not present

## 2023-04-13 DIAGNOSIS — R634 Abnormal weight loss: Secondary | ICD-10-CM | POA: Diagnosis not present

## 2023-04-13 DIAGNOSIS — Z7982 Long term (current) use of aspirin: Secondary | ICD-10-CM | POA: Diagnosis not present

## 2023-04-13 DIAGNOSIS — D509 Iron deficiency anemia, unspecified: Secondary | ICD-10-CM | POA: Diagnosis not present

## 2023-04-13 DIAGNOSIS — Z9842 Cataract extraction status, left eye: Secondary | ICD-10-CM | POA: Diagnosis not present

## 2023-04-13 DIAGNOSIS — Z9841 Cataract extraction status, right eye: Secondary | ICD-10-CM | POA: Diagnosis not present

## 2023-04-13 DIAGNOSIS — Z681 Body mass index (BMI) 19 or less, adult: Secondary | ICD-10-CM | POA: Diagnosis not present

## 2023-04-13 DIAGNOSIS — Z8673 Personal history of transient ischemic attack (TIA), and cerebral infarction without residual deficits: Secondary | ICD-10-CM | POA: Diagnosis not present

## 2023-04-13 DIAGNOSIS — Z9089 Acquired absence of other organs: Secondary | ICD-10-CM | POA: Diagnosis not present

## 2023-04-13 DIAGNOSIS — Z87442 Personal history of urinary calculi: Secondary | ICD-10-CM | POA: Diagnosis not present

## 2023-04-15 DIAGNOSIS — E785 Hyperlipidemia, unspecified: Secondary | ICD-10-CM | POA: Diagnosis not present

## 2023-04-15 DIAGNOSIS — Z9841 Cataract extraction status, right eye: Secondary | ICD-10-CM | POA: Diagnosis not present

## 2023-04-15 DIAGNOSIS — Z7982 Long term (current) use of aspirin: Secondary | ICD-10-CM | POA: Diagnosis not present

## 2023-04-15 DIAGNOSIS — Z87891 Personal history of nicotine dependence: Secondary | ICD-10-CM | POA: Diagnosis not present

## 2023-04-15 DIAGNOSIS — R634 Abnormal weight loss: Secondary | ICD-10-CM | POA: Diagnosis not present

## 2023-04-15 DIAGNOSIS — N3281 Overactive bladder: Secondary | ICD-10-CM | POA: Diagnosis not present

## 2023-04-15 DIAGNOSIS — Z8673 Personal history of transient ischemic attack (TIA), and cerebral infarction without residual deficits: Secondary | ICD-10-CM | POA: Diagnosis not present

## 2023-04-15 DIAGNOSIS — L89891 Pressure ulcer of other site, stage 1: Secondary | ICD-10-CM | POA: Diagnosis not present

## 2023-04-15 DIAGNOSIS — E871 Hypo-osmolality and hyponatremia: Secondary | ICD-10-CM | POA: Diagnosis not present

## 2023-04-15 DIAGNOSIS — D509 Iron deficiency anemia, unspecified: Secondary | ICD-10-CM | POA: Diagnosis not present

## 2023-04-15 DIAGNOSIS — K219 Gastro-esophageal reflux disease without esophagitis: Secondary | ICD-10-CM | POA: Diagnosis not present

## 2023-04-15 DIAGNOSIS — E1122 Type 2 diabetes mellitus with diabetic chronic kidney disease: Secondary | ICD-10-CM | POA: Diagnosis not present

## 2023-04-15 DIAGNOSIS — Z9089 Acquired absence of other organs: Secondary | ICD-10-CM | POA: Diagnosis not present

## 2023-04-15 DIAGNOSIS — M452 Ankylosing spondylitis of cervical region: Secondary | ICD-10-CM | POA: Diagnosis not present

## 2023-04-15 DIAGNOSIS — Z96641 Presence of right artificial hip joint: Secondary | ICD-10-CM | POA: Diagnosis not present

## 2023-04-15 DIAGNOSIS — M1990 Unspecified osteoarthritis, unspecified site: Secondary | ICD-10-CM | POA: Diagnosis not present

## 2023-04-15 DIAGNOSIS — L89321 Pressure ulcer of left buttock, stage 1: Secondary | ICD-10-CM | POA: Diagnosis not present

## 2023-04-15 DIAGNOSIS — Z87442 Personal history of urinary calculi: Secondary | ICD-10-CM | POA: Diagnosis not present

## 2023-04-15 DIAGNOSIS — Z681 Body mass index (BMI) 19 or less, adult: Secondary | ICD-10-CM | POA: Diagnosis not present

## 2023-04-15 DIAGNOSIS — Z7902 Long term (current) use of antithrombotics/antiplatelets: Secondary | ICD-10-CM | POA: Diagnosis not present

## 2023-04-15 DIAGNOSIS — Z9842 Cataract extraction status, left eye: Secondary | ICD-10-CM | POA: Diagnosis not present

## 2023-04-15 DIAGNOSIS — D638 Anemia in other chronic diseases classified elsewhere: Secondary | ICD-10-CM | POA: Diagnosis not present

## 2023-04-17 DIAGNOSIS — Z9841 Cataract extraction status, right eye: Secondary | ICD-10-CM | POA: Diagnosis not present

## 2023-04-17 DIAGNOSIS — D509 Iron deficiency anemia, unspecified: Secondary | ICD-10-CM | POA: Diagnosis not present

## 2023-04-17 DIAGNOSIS — E871 Hypo-osmolality and hyponatremia: Secondary | ICD-10-CM | POA: Diagnosis not present

## 2023-04-17 DIAGNOSIS — E785 Hyperlipidemia, unspecified: Secondary | ICD-10-CM | POA: Diagnosis not present

## 2023-04-17 DIAGNOSIS — Z87442 Personal history of urinary calculi: Secondary | ICD-10-CM | POA: Diagnosis not present

## 2023-04-17 DIAGNOSIS — Z87891 Personal history of nicotine dependence: Secondary | ICD-10-CM | POA: Diagnosis not present

## 2023-04-17 DIAGNOSIS — L89321 Pressure ulcer of left buttock, stage 1: Secondary | ICD-10-CM | POA: Diagnosis not present

## 2023-04-17 DIAGNOSIS — M452 Ankylosing spondylitis of cervical region: Secondary | ICD-10-CM | POA: Diagnosis not present

## 2023-04-17 DIAGNOSIS — Z9842 Cataract extraction status, left eye: Secondary | ICD-10-CM | POA: Diagnosis not present

## 2023-04-17 DIAGNOSIS — Z9089 Acquired absence of other organs: Secondary | ICD-10-CM | POA: Diagnosis not present

## 2023-04-17 DIAGNOSIS — L89891 Pressure ulcer of other site, stage 1: Secondary | ICD-10-CM | POA: Diagnosis not present

## 2023-04-17 DIAGNOSIS — Z96641 Presence of right artificial hip joint: Secondary | ICD-10-CM | POA: Diagnosis not present

## 2023-04-17 DIAGNOSIS — K219 Gastro-esophageal reflux disease without esophagitis: Secondary | ICD-10-CM | POA: Diagnosis not present

## 2023-04-17 DIAGNOSIS — Z7902 Long term (current) use of antithrombotics/antiplatelets: Secondary | ICD-10-CM | POA: Diagnosis not present

## 2023-04-17 DIAGNOSIS — N3281 Overactive bladder: Secondary | ICD-10-CM | POA: Diagnosis not present

## 2023-04-17 DIAGNOSIS — M1990 Unspecified osteoarthritis, unspecified site: Secondary | ICD-10-CM | POA: Diagnosis not present

## 2023-04-17 DIAGNOSIS — Z7982 Long term (current) use of aspirin: Secondary | ICD-10-CM | POA: Diagnosis not present

## 2023-04-17 DIAGNOSIS — Z8673 Personal history of transient ischemic attack (TIA), and cerebral infarction without residual deficits: Secondary | ICD-10-CM | POA: Diagnosis not present

## 2023-04-17 DIAGNOSIS — R634 Abnormal weight loss: Secondary | ICD-10-CM | POA: Diagnosis not present

## 2023-04-17 DIAGNOSIS — Z681 Body mass index (BMI) 19 or less, adult: Secondary | ICD-10-CM | POA: Diagnosis not present

## 2023-04-19 ENCOUNTER — Ambulatory Visit: Payer: Self-pay

## 2023-04-19 ENCOUNTER — Ambulatory Visit: Payer: Medicare Other | Admitting: Family Medicine

## 2023-04-19 ENCOUNTER — Ambulatory Visit (INDEPENDENT_AMBULATORY_CARE_PROVIDER_SITE_OTHER): Payer: Medicare Other

## 2023-04-19 VITALS — BP 112/62 | HR 79 | Ht 67.0 in | Wt 124.0 lb

## 2023-04-19 DIAGNOSIS — G8929 Other chronic pain: Secondary | ICD-10-CM | POA: Diagnosis not present

## 2023-04-19 DIAGNOSIS — M25512 Pain in left shoulder: Secondary | ICD-10-CM | POA: Diagnosis not present

## 2023-04-19 DIAGNOSIS — I700 Atherosclerosis of aorta: Secondary | ICD-10-CM | POA: Diagnosis not present

## 2023-04-19 DIAGNOSIS — M19012 Primary osteoarthritis, left shoulder: Secondary | ICD-10-CM | POA: Diagnosis not present

## 2023-04-19 NOTE — Patient Instructions (Addendum)
 Thank you for coming in today.   Please get an Xray today before you leave   You received an injection today. Seek immediate medical attention if the joint becomes red, extremely painful, or is oozing fluid.   Check back as needed  Let me know how it goes

## 2023-04-19 NOTE — Progress Notes (Signed)
 [name], PhD, LAT, ATC acting as a scribe for [name], MD.  Steven [name] is a 89 y.o. male who presents to [facility] today for L shoulder pain. Pt was previously seen by Dr. Lloyd in 2022-23 for generalized weakness and R hip pain.  Today, pt c/o L shoulder pain x 2 years intermittently. Pain is now more consistent. Pt locates pain to the anterior aspect of his L shoulder. He is RHD.   Radiates: no Aggravates: overhead motions, IR, L-side lying Treatments tried: Tylenol   Pertinent review of systems: No fevers or chills  Relevant historical information: Recently had a dislocation of the right hip which is status post total hip replacement.  This occurred November 2024 and required a 30-day stay in a skilled nursing facility for rehab.   Exam:  BP 112/62   Pulse 79   Ht 5' 7 (1.702 m)   Wt 124 lb (56.2 kg)   SpO2 100%   BMI 19.42 kg/m  General: Well Developed, well nourished, and in no acute distress.   MSK: Left shoulder normal-appearing normal motion.  Weakness to abduction.  Crepitation with range of motion.    Lab and Radiology Results  Procedure: Real-time Ultrasound Guided Injection of left shoulder glenohumeral joint posterior approach Device: Philips Affiniti 50G/GE Logiq Images permanently stored and available for review in PACS Verbal informed consent obtained.  Discussed risks and benefits of procedure. Warned about infection, bleeding, hyperglycemia damage to structures among others. Patient expresses understanding and agreement Time-out conducted.   Noted no overlying erythema, induration, or other signs of local infection.   Skin prepped in a sterile fashion.   Local anesthesia: Topical Ethyl chloride.   With sterile technique and under real time ultrasound guidance: 40 mg of Kenalog  and 2 mL's of Marcaine  injected into glenohumeral joint. Fluid seen entering the joint capsule.   Completed without difficulty    Pain immediately resolved suggesting accurate placement of the medication.   Advised to call if fevers/chills, erythema, induration, drainage, or persistent bleeding.   Images permanently stored and available for review in the ultrasound unit.  Impression: Technically successful ultrasound guided injection.   X-ray images left shoulder obtained today personally and independently interpreted. High riding humeral head with mild to moderate glenohumeral DJD.  No acute fractures are visible.  Concern full-thickness rotator cuff tear. Await formal radiology review      Assessment and Plan: 89 y.o. male with chronic left shoulder pain worsening recently.  Suspicious for full-thickness rotator cuff tear based on x-ray and ultrasound appearance.  Glenohumeral injection performed today.  Consider physical therapy if needed although his priority has been on his hip recently.  Suspect the recent hip dislocation caused increased arm loading causing worsening shoulder pain.  Injection should be helpful.   PDMP not reviewed this encounter. Orders Placed This Encounter  Procedures   DG Shoulder Left    Standing Status:   Future    Number of Occurrences:   1    Expiration Date:   05/20/2023    Reason for Exam (SYMPTOM  OR DIAGNOSIS REQUIRED):   left shoulder pain    Preferred imaging location?:   [facility]   US  LIMITED JOINT SPACE STRUCTURES UP LEFT(NO LINKED CHARGES)    Reason for Exam (SYMPTOM  OR DIAGNOSIS REQUIRED):   left shoulder pain    Preferred imaging location?:   [facility]   No orders of the defined types were  placed in this encounter.    Discussed warning signs or symptoms. Please see discharge instructions. Patient expresses understanding.   The above documentation has been reviewed and is accurate and complete [name], M.D.

## 2023-04-21 DIAGNOSIS — N3281 Overactive bladder: Secondary | ICD-10-CM | POA: Diagnosis not present

## 2023-04-21 DIAGNOSIS — K219 Gastro-esophageal reflux disease without esophagitis: Secondary | ICD-10-CM | POA: Diagnosis not present

## 2023-04-21 DIAGNOSIS — R634 Abnormal weight loss: Secondary | ICD-10-CM | POA: Diagnosis not present

## 2023-04-21 DIAGNOSIS — M1990 Unspecified osteoarthritis, unspecified site: Secondary | ICD-10-CM | POA: Diagnosis not present

## 2023-04-21 DIAGNOSIS — L89891 Pressure ulcer of other site, stage 1: Secondary | ICD-10-CM | POA: Diagnosis not present

## 2023-04-21 DIAGNOSIS — E871 Hypo-osmolality and hyponatremia: Secondary | ICD-10-CM | POA: Diagnosis not present

## 2023-04-21 DIAGNOSIS — E785 Hyperlipidemia, unspecified: Secondary | ICD-10-CM | POA: Diagnosis not present

## 2023-04-21 DIAGNOSIS — Z96641 Presence of right artificial hip joint: Secondary | ICD-10-CM | POA: Diagnosis not present

## 2023-04-21 DIAGNOSIS — M452 Ankylosing spondylitis of cervical region: Secondary | ICD-10-CM | POA: Diagnosis not present

## 2023-04-21 DIAGNOSIS — Z7982 Long term (current) use of aspirin: Secondary | ICD-10-CM | POA: Diagnosis not present

## 2023-04-21 DIAGNOSIS — Z7902 Long term (current) use of antithrombotics/antiplatelets: Secondary | ICD-10-CM | POA: Diagnosis not present

## 2023-04-21 DIAGNOSIS — Z87891 Personal history of nicotine dependence: Secondary | ICD-10-CM | POA: Diagnosis not present

## 2023-04-21 DIAGNOSIS — D509 Iron deficiency anemia, unspecified: Secondary | ICD-10-CM | POA: Diagnosis not present

## 2023-04-21 DIAGNOSIS — Z9089 Acquired absence of other organs: Secondary | ICD-10-CM | POA: Diagnosis not present

## 2023-04-21 DIAGNOSIS — L89321 Pressure ulcer of left buttock, stage 1: Secondary | ICD-10-CM | POA: Diagnosis not present

## 2023-04-21 DIAGNOSIS — Z9842 Cataract extraction status, left eye: Secondary | ICD-10-CM | POA: Diagnosis not present

## 2023-04-21 DIAGNOSIS — Z681 Body mass index (BMI) 19 or less, adult: Secondary | ICD-10-CM | POA: Diagnosis not present

## 2023-04-21 DIAGNOSIS — Z9841 Cataract extraction status, right eye: Secondary | ICD-10-CM | POA: Diagnosis not present

## 2023-04-21 DIAGNOSIS — Z87442 Personal history of urinary calculi: Secondary | ICD-10-CM | POA: Diagnosis not present

## 2023-04-21 DIAGNOSIS — Z8673 Personal history of transient ischemic attack (TIA), and cerebral infarction without residual deficits: Secondary | ICD-10-CM | POA: Diagnosis not present

## 2023-04-22 DIAGNOSIS — E785 Hyperlipidemia, unspecified: Secondary | ICD-10-CM | POA: Diagnosis not present

## 2023-04-22 DIAGNOSIS — R9389 Abnormal findings on diagnostic imaging of other specified body structures: Secondary | ICD-10-CM | POA: Diagnosis not present

## 2023-04-22 DIAGNOSIS — D539 Nutritional anemia, unspecified: Secondary | ICD-10-CM | POA: Diagnosis not present

## 2023-04-22 DIAGNOSIS — E1122 Type 2 diabetes mellitus with diabetic chronic kidney disease: Secondary | ICD-10-CM | POA: Diagnosis not present

## 2023-04-22 NOTE — Progress Notes (Signed)
 Left shoulder x-ray shows medium to severe arthritis in the main shoulder joint.  No fractures are visible.

## 2023-04-23 DIAGNOSIS — Z9089 Acquired absence of other organs: Secondary | ICD-10-CM | POA: Diagnosis not present

## 2023-04-23 DIAGNOSIS — L89321 Pressure ulcer of left buttock, stage 1: Secondary | ICD-10-CM | POA: Diagnosis not present

## 2023-04-23 DIAGNOSIS — Z681 Body mass index (BMI) 19 or less, adult: Secondary | ICD-10-CM | POA: Diagnosis not present

## 2023-04-23 DIAGNOSIS — D509 Iron deficiency anemia, unspecified: Secondary | ICD-10-CM | POA: Diagnosis not present

## 2023-04-23 DIAGNOSIS — R634 Abnormal weight loss: Secondary | ICD-10-CM | POA: Diagnosis not present

## 2023-04-23 DIAGNOSIS — K219 Gastro-esophageal reflux disease without esophagitis: Secondary | ICD-10-CM | POA: Diagnosis not present

## 2023-04-23 DIAGNOSIS — M452 Ankylosing spondylitis of cervical region: Secondary | ICD-10-CM | POA: Diagnosis not present

## 2023-04-23 DIAGNOSIS — M1990 Unspecified osteoarthritis, unspecified site: Secondary | ICD-10-CM | POA: Diagnosis not present

## 2023-04-23 DIAGNOSIS — Z87442 Personal history of urinary calculi: Secondary | ICD-10-CM | POA: Diagnosis not present

## 2023-04-23 DIAGNOSIS — Z7982 Long term (current) use of aspirin: Secondary | ICD-10-CM | POA: Diagnosis not present

## 2023-04-23 DIAGNOSIS — Z9842 Cataract extraction status, left eye: Secondary | ICD-10-CM | POA: Diagnosis not present

## 2023-04-23 DIAGNOSIS — E785 Hyperlipidemia, unspecified: Secondary | ICD-10-CM | POA: Diagnosis not present

## 2023-04-23 DIAGNOSIS — Z8673 Personal history of transient ischemic attack (TIA), and cerebral infarction without residual deficits: Secondary | ICD-10-CM | POA: Diagnosis not present

## 2023-04-23 DIAGNOSIS — Z87891 Personal history of nicotine dependence: Secondary | ICD-10-CM | POA: Diagnosis not present

## 2023-04-23 DIAGNOSIS — Z7902 Long term (current) use of antithrombotics/antiplatelets: Secondary | ICD-10-CM | POA: Diagnosis not present

## 2023-04-23 DIAGNOSIS — N3281 Overactive bladder: Secondary | ICD-10-CM | POA: Diagnosis not present

## 2023-04-23 DIAGNOSIS — Z9841 Cataract extraction status, right eye: Secondary | ICD-10-CM | POA: Diagnosis not present

## 2023-04-23 DIAGNOSIS — Z96641 Presence of right artificial hip joint: Secondary | ICD-10-CM | POA: Diagnosis not present

## 2023-04-23 DIAGNOSIS — E871 Hypo-osmolality and hyponatremia: Secondary | ICD-10-CM | POA: Diagnosis not present

## 2023-04-23 DIAGNOSIS — L89891 Pressure ulcer of other site, stage 1: Secondary | ICD-10-CM | POA: Diagnosis not present

## 2023-04-26 ENCOUNTER — Telehealth: Payer: Self-pay | Admitting: Hematology and Oncology

## 2023-04-27 DIAGNOSIS — Z7902 Long term (current) use of antithrombotics/antiplatelets: Secondary | ICD-10-CM | POA: Diagnosis not present

## 2023-04-27 DIAGNOSIS — E871 Hypo-osmolality and hyponatremia: Secondary | ICD-10-CM | POA: Diagnosis not present

## 2023-04-27 DIAGNOSIS — E785 Hyperlipidemia, unspecified: Secondary | ICD-10-CM | POA: Diagnosis not present

## 2023-04-28 DIAGNOSIS — Z96641 Presence of right artificial hip joint: Secondary | ICD-10-CM | POA: Diagnosis not present

## 2023-04-28 DIAGNOSIS — E785 Hyperlipidemia, unspecified: Secondary | ICD-10-CM | POA: Diagnosis not present

## 2023-04-28 DIAGNOSIS — Z8673 Personal history of transient ischemic attack (TIA), and cerebral infarction without residual deficits: Secondary | ICD-10-CM | POA: Diagnosis not present

## 2023-04-28 DIAGNOSIS — Z9841 Cataract extraction status, right eye: Secondary | ICD-10-CM | POA: Diagnosis not present

## 2023-04-28 DIAGNOSIS — Z9089 Acquired absence of other organs: Secondary | ICD-10-CM | POA: Diagnosis not present

## 2023-04-28 DIAGNOSIS — Z7982 Long term (current) use of aspirin: Secondary | ICD-10-CM | POA: Diagnosis not present

## 2023-04-28 DIAGNOSIS — N3281 Overactive bladder: Secondary | ICD-10-CM | POA: Diagnosis not present

## 2023-04-28 DIAGNOSIS — L89321 Pressure ulcer of left buttock, stage 1: Secondary | ICD-10-CM | POA: Diagnosis not present

## 2023-04-28 DIAGNOSIS — M452 Ankylosing spondylitis of cervical region: Secondary | ICD-10-CM | POA: Diagnosis not present

## 2023-04-28 DIAGNOSIS — Z681 Body mass index (BMI) 19 or less, adult: Secondary | ICD-10-CM | POA: Diagnosis not present

## 2023-04-28 DIAGNOSIS — R634 Abnormal weight loss: Secondary | ICD-10-CM | POA: Diagnosis not present

## 2023-04-28 DIAGNOSIS — Z87891 Personal history of nicotine dependence: Secondary | ICD-10-CM | POA: Diagnosis not present

## 2023-04-28 DIAGNOSIS — Z87442 Personal history of urinary calculi: Secondary | ICD-10-CM | POA: Diagnosis not present

## 2023-04-28 DIAGNOSIS — M1990 Unspecified osteoarthritis, unspecified site: Secondary | ICD-10-CM | POA: Diagnosis not present

## 2023-04-28 DIAGNOSIS — Z7902 Long term (current) use of antithrombotics/antiplatelets: Secondary | ICD-10-CM | POA: Diagnosis not present

## 2023-04-28 DIAGNOSIS — E871 Hypo-osmolality and hyponatremia: Secondary | ICD-10-CM | POA: Diagnosis not present

## 2023-04-28 DIAGNOSIS — Z9842 Cataract extraction status, left eye: Secondary | ICD-10-CM | POA: Diagnosis not present

## 2023-04-28 DIAGNOSIS — D509 Iron deficiency anemia, unspecified: Secondary | ICD-10-CM | POA: Diagnosis not present

## 2023-04-28 DIAGNOSIS — L89891 Pressure ulcer of other site, stage 1: Secondary | ICD-10-CM | POA: Diagnosis not present

## 2023-04-28 DIAGNOSIS — K219 Gastro-esophageal reflux disease without esophagitis: Secondary | ICD-10-CM | POA: Diagnosis not present

## 2023-04-30 DIAGNOSIS — M452 Ankylosing spondylitis of cervical region: Secondary | ICD-10-CM | POA: Diagnosis not present

## 2023-04-30 DIAGNOSIS — H44111 Panuveitis, right eye: Secondary | ICD-10-CM | POA: Diagnosis not present

## 2023-04-30 DIAGNOSIS — Z961 Presence of intraocular lens: Secondary | ICD-10-CM | POA: Diagnosis not present

## 2023-04-30 DIAGNOSIS — H35711 Central serous chorioretinopathy, right eye: Secondary | ICD-10-CM | POA: Diagnosis not present

## 2023-04-30 DIAGNOSIS — Z79899 Other long term (current) drug therapy: Secondary | ICD-10-CM | POA: Diagnosis not present

## 2023-05-02 NOTE — Progress Notes (Unsigned)
[facility] [phone]   [phone]  PROGRESS NOTE  Patient Care Team: [name], MD as PCP - General (Internal Medicine) Jaci Standard, MD as Consulting Physician (Hematology and Oncology)  Hematological/Oncological History #Normocytic Anemia #Iron Deficiency Anemia 1) 01/01/2017: WBC 5.1, Hgb 11.0, Plt 254, MCV 91.6 2) 12/07/2017: WBC 8.0, Hgb 11.9, Plt 352, MCV 91.4. Iron 76, TIBC 359, Sat 21%, Ferritin 31 3) 03/09/2019: WBC 14.3, Hgb 11.1, Plt 374, MCV 88.8 4) 05/22/2019: WBC 5.5, Hgb 10.7, Plt 311, MCV 83.7. Routine PCP visit. 5) 06/14/2019: establish care with Dr. Leonides Schanz. WBC 5.0, Hgb 10.3, MCV 84.9, Plt 320 6) 09/14/2019: WBC 6.2, Hgb 10.9, MCV 91.2, Plt 209 7)  11/01/2019: WBC 9.2, Hgb 12.6, MVC 97.2, Plt 299 8) 11/05/2019: patient admitted with SBO, instructed to stop PO iron indefinitely.  9) 02/28/2020: admitted with SBO, Hgb drop to 9.6 while inpatient 10) 05/17/2020: Hgb 11.7, increased without intervention.  11) 09/05/2020: WBC 6.6, Hgb 10.9, MCV 94.6, Plt 259 12) 6/6-6/17/2022: IV feraheme 510 mg x 2 doses  13) 6/21-6/30/2023: IV feraheme 510 mg x 2 doses  14) 02/20/2022: WBC 5.8, Hgb 11.5, MCV 98.5, Plt 262  Interval History:  Steven [name] 89 y.o. male with medical history significant for iron deficiency anemia who presents for a follow up visit. The patient's last visit was on 08/07/2022. In the interim since the last visit Mr. Takashima has been at his baseline level of health.   On exam today Mr. Multer notes he has been having issues with his "hip displacing" and reports it happened 3 times.  He has been in the hospital for this.  He reports last time he was in the hospital he ended up in rehab.  He reports that his energy levels have been "okay".  He notes about a 7 out of 10.  He reports his appetite has been strong.  He has lost about 10 to 15 pounds due to having an episode of norovirus that lasted for 4 days.  He reports he is trying  to put his weight back on after that episode.  He reports that he was taking iron pills but stopped them but cannot recall why.  He has not been having any overt signs of bleeding, bruising, or dark stools.  He reports he is not having any lightheadedness, dizziness, shortness of breath.  He has not had any abnormal symptoms.  Otherwise he feels well and has no questions concerns or complaints today.  He denies having fevers, chills, sweats, nausea, vomiting or diarrhea. A full 10 point ROS is listed below.   MEDICAL HISTORY:  Past Medical History:  Diagnosis Date   Allergy    enviromental   Anemia 2017   Anxiety    Arthritis    ankylosing spodilitis   Blood transfusion without reported diagnosis    during surgery   Cataract    Depression    Dyspnea    with exertion - had Echo done 09/29/16   History of kidney stones    Hyperlipidemia    Intestinal obstruction (HCC)    Pneumonia    as a child    SURGICAL HISTORY: Past Surgical History:  Procedure Laterality Date   ABDOMINAL SURGERY     had abcess   APPENDECTOMY     CHOLECYSTECTOMY     with lysis of adhesions   COLONOSCOPY     COLOSTOMY     COLOSTOMY REVERSAL     EYE SURGERY Left  scar tissue removed from cornea   EYE SURGERY Right    cataract surgery with lens implant   JOINT REPLACEMENT Right    hip  x 2 1999 and 2007   SHOULDER ARTHROSCOPY WITH ROTATOR CUFF REPAIR AND SUBACROMIAL DECOMPRESSION Left 03/02/2013   Procedure: LEFT SHOULDER ARTHROSCOPY WITH SUBACROMIAL DECOMPRESSION DISTAL CALVICLE RESECTION AND ROTATOR CUFF REPAIR ;  Surgeon: Senaida Lange, MD;  Location: [facility] OR;  Service: Orthopedics;  Laterality: Left;   TOTAL HIP REVISION Right 11/06/2016   Procedure: TOTAL HIP REVISION OF THE ACETABULAR COMPONENT;  Surgeon: Gean Birchwood, MD;  Location: [facility] OR;  Service: Orthopedics;  Laterality: Right;   TRANSCAROTID ARTERY REVASCULARIZATION  Left 03/31/2021   Procedure: TRANSCAROTID ARTERY REVASCULARIZATION;  Surgeon:  Victorino Sparrow, MD;  Location: [facility] OR;  Service: Vascular;  Laterality: Left;   ULTRASOUND GUIDANCE FOR VASCULAR ACCESS Right 03/31/2021   Procedure: ULTRASOUND GUIDANCE FOR VASCULAR ACCESS;  Surgeon: Victorino Sparrow, MD;  Location: [facility] OR;  Service: Vascular;  Laterality: Right;    SOCIAL HISTORY: Social History   Socioeconomic History   Marital status: Married    Spouse name: Not on file   Number of children: 2   Years of education: Not on file   Highest education level: Not on file  Occupational History   Occupation: retired  Tobacco Use   Smoking status: Former    Current packs/day: 0.00    Average packs/day: 1 pack/day for 9.0 years (9.0 ttl pk-yrs)    Types: Cigarettes    Start date: 04/13/1956    Quit date: 04/13/1965    Years since quitting: 58.0    Passive exposure: Never   Smokeless tobacco: Never  Vaping Use   Vaping status: Never Used  Substance and Sexual Activity   Alcohol use: Yes    Comment: 3 beers or glasses of wine a day--reports stopped ETOH 11/01/16    Drug use: No   Sexual activity: Never  Other Topics Concern   Not on file  Social History Narrative   Not on file   Social Drivers of Health   Financial Resource Strain: Low Risk  (03/09/2019)   Overall Financial Resource Strain (CARDIA)    Difficulty of Paying Living Expenses: Not hard at all  Food Insecurity: No Food Insecurity (03/17/2023)   Hunger Vital Sign    Worried About Running Out of Food in the Last Year: Never true    Ran Out of Food in the Last Year: Never true  Transportation Needs: No Transportation Needs (03/17/2023)   PRAPARE - Administrator, Civil Service (Medical): No    Lack of Transportation (Non-Medical): No  Physical Activity: Insufficiently Active (03/09/2019)   Exercise Vital Sign    Days of Exercise per Week: 5 days    Minutes of Exercise per Session: 10 min  Stress: No Stress Concern Present (03/09/2019)   [employer] of Occupational Health -  Occupational Stress Questionnaire    Feeling of Stress : Not at all  Social Connections: Unknown (03/09/2019)   Social Connection and Isolation Panel [NHANES]    Frequency of Communication with Friends and Family: Patient declined    Frequency of Social Gatherings with Friends and Family: Patient declined    Attends Religious Services: Patient declined    Active Member of Clubs or Organizations: Patient declined    Attends Banker Meetings: Patient declined    Marital Status: Patient declined  Intimate Partner Violence: Not At Risk (03/17/2023)   Humiliation,  Afraid, Rape, and Kick questionnaire    Fear of Current or Ex-Partner: No    Emotionally Abused: No    Physically Abused: No    Sexually Abused: No    FAMILY HISTORY: Family History  Problem Relation Age of Onset   Heart attack Mother    Diabetes Mother    Breast cancer Mother    Heart attack Maternal Grandfather    Pancreatic cancer Brother    Colon cancer Neg Hx    Esophageal cancer Neg Hx    Stomach cancer Neg Hx     ALLERGIES:  is allergic to indomethacin and lactose intolerance (gi).  MEDICATIONS:  Current Outpatient Medications  Medication Sig Dispense Refill   acetaminophen (TYLENOL) 325 MG tablet Take 325-650 mg by mouth every 6 (six) hours as needed for mild pain (pain score 1-3) or headache.     Artificial Tear Solution (SOOTHE XP) SOLN Place 1 drop into both eyes 2 (two) times daily as needed (for dryness).     aspirin EC 325 MG EC tablet Take 1 tablet (325 mg total) by mouth daily. 30 tablet 1   Biotin 5000 MCG TABS Take 5,000 mcg by mouth daily.     Calcium Carb-Cholecalciferol (CALCIUM+D3 PO) Take 2 tablets by mouth 2 (two) times daily with a meal.     Calcium Citrate 250 MG TABS Take 250 mg by mouth daily.     Cholecalciferol (VITAMIN D3) 1000 units CAPS Take 2,000 Units by mouth daily.     clopidogrel (PLAVIX) 75 MG tablet Take 1 tablet (75 mg total) by mouth daily. (Patient taking  differently: Take 75 mg by mouth at bedtime.) 30 tablet 1   cyanocobalamin (VITAMIN B12) 1000 MCG tablet Take 1,000 mcg by mouth daily.     fluoruracil (CARAC) 0.5 % cream Apply 1 application  topically daily as needed (for cancer treatment- as directed to affected areas).     folic acid (FOLVITE) 1 MG tablet Take 1 mg by mouth in the morning and at bedtime.     GEMTESA 75 MG TABS Take 75 mg by mouth at bedtime.     ipratropium (ATROVENT) 0.06 % nasal spray Place 2 sprays into both nostrils 2 (two) times daily as needed (allergies).     lactose free nutrition (BOOST) LIQD Take 237 mLs by mouth 2 (two) times daily between meals.     melatonin 5 MG TABS Take 5 mg by mouth at bedtime.     methotrexate (RHEUMATREX) 2.5 MG tablet Take 20 mg by mouth every Sunday.     Multiple Vitamins-Minerals (PRESERVISION AREDS 2 PO) Take 1 capsule by mouth in the morning and at bedtime.     oxyCODONE (ROXICODONE) 5 MG immediate release tablet Take 1 tablet (5 mg total) by mouth every 6 (six) hours as needed for severe pain (pain score 7-10). (Patient not taking: Reported on 04/19/2023) 10 tablet 0   pantoprazole (PROTONIX) 40 MG tablet Take 1 tablet (40 mg total) by mouth daily. (Patient taking differently: Take 40 mg by mouth daily before breakfast.) 30 tablet 1   polyethylene glycol (MIRALAX / GLYCOLAX) 17 g packet Take 17 g by mouth daily.     rosuvastatin (CRESTOR) 20 MG tablet Take 1 tablet (20 mg total) by mouth at bedtime. 30 tablet 1   senna-docusate (SENOKOT-S) 8.6-50 MG tablet Take 1 tablet by mouth daily.     sodium chloride 1 g tablet Take 2 tablets (2 g total) by mouth 3 (three) times daily with  meals. 180 tablet 0   No current facility-administered medications for this visit.    REVIEW OF SYSTEMS:   Constitutional: ( - ) fevers, ( - )  chills , ( - ) night sweats Eyes: ( - ) blurriness of vision, ( - ) double vision, ( - ) watery eyes Ears, nose, mouth, throat, and face: ( - ) mucositis, ( - ) sore  throat Respiratory: ( - ) cough, ( - ) dyspnea, ( - ) wheezes Cardiovascular: ( - ) palpitation, ( - ) chest discomfort, ( - ) lower extremity swelling Gastrointestinal:  ( - ) nausea, ( - ) heartburn, ( - ) change in bowel habits Skin: ( - ) abnormal skin rashes Lymphatics: ( - ) new lymphadenopathy, ( - ) easy bruising Neurological: ( - ) numbness, ( - ) tingling, ( - ) new weaknesses Behavioral/Psych: ( - ) mood change, ( - ) new changes  All other systems were reviewed with the patient and are negative.  PHYSICAL EXAMINATION: ECOG PERFORMANCE STATUS: 1 - Symptomatic but completely ambulatory  Vitals:   05/03/23 1521  BP: 137/62  Pulse: 73  Resp: 15  Temp: 98.4 F (36.9 C)  SpO2: 100%    Filed Weights   05/03/23 1521  Weight: 121 lb 6.4 oz (55.1 kg)     GENERAL: well appearing elderly Caucasian male in NAD  SKIN: skin color, texture, turgor are normal, no rashes or significant lesions EYES: conjunctiva are pink and non-injected, sclera clear LUNGS: clear to auscultation and percussion with normal breathing effort HEART: regular rate & rhythm and no murmurs and no lower extremity edema ABDOMEN: soft, non-tender, non-distended, normal bowel sounds. No HSM appreciated. Musculoskeletal: no cyanosis of digits and no clubbing  PSYCH: alert & oriented x 3, fluent speech NEURO: no focal motor/sensory deficits  LABORATORY DATA:  I have reviewed the data as listed    Latest Ref Rng & Units 05/03/2023    2:51 PM 03/21/2023    5:19 AM 03/20/2023    4:36 PM  CBC  WBC 4.0 - 10.5 K/uL 11.0  7.3  6.7   Hemoglobin 13.0 - 17.0 g/dL 28.4  9.8  13.2   Hematocrit 39.0 - 52.0 % 34.7  28.6  31.8   Platelets 150 - 400 K/uL 282  266  329        Latest Ref Rng & Units 05/03/2023    2:51 PM 03/22/2023    5:03 AM 03/21/2023    5:19 AM  CMP  Glucose 70 - 99 mg/dL 440  83  91   BUN 8 - 23 mg/dL 17  12  11    Creatinine 0.61 - 1.24 mg/dL 1.02  7.25  3.66   Sodium 135 - 145 mmol/L 138  127   127   Potassium 3.5 - 5.1 mmol/L 4.0  3.3  3.5   Chloride 98 - 111 mmol/L 102  96  96   CO2 22 - 32 mmol/L 32  22  25   Calcium 8.9 - 10.3 mg/dL 9.4  8.1  8.3   Total Protein 6.5 - 8.1 g/dL 6.9     Total Bilirubin 0.0 - 1.2 mg/dL 0.6     Alkaline Phos 38 - 126 U/L 61     AST 15 - 41 U/L 17     ALT 0 - 44 U/L 14       No results found for: "MPROTEIN" Lab Results  Component Value Date   KPAFRELGTCHN 16.5 06/14/2019  LAMBDASER 14.3 06/14/2019   KAPLAMBRATIO 1.15 06/14/2019    RADIOGRAPHIC STUDIES: DG Shoulder Left Result Date: 04/21/2023 CLINICAL DATA:  Chronic left shoulder pain. EXAM: LEFT SHOULDER - 2+ VIEW COMPARISON:  CT cervical spine 02/19/2023 FINDINGS: Moderate to severe inferior glenohumeral joint space narrowing and inferior osteophytosis. Probable postsurgical changes of partial distal clavicle excision. No acute fracture is seen. No dislocation. Moderate atherosclerotic calcifications within the aortic arch. Partial visualization of left carotid arterial stent as seen on prior CT. IMPRESSION: Moderate to severe glenohumeral osteoarthritis. Electronically Signed   By: Neita Garnet M.D.   On: 04/21/2023 23:32   [place] LIMITED JOINT SPACE STRUCTURES UP LEFT(NO LINKED CHARGES) Result Date: 04/20/2023 Procedure: Real-time Ultrasound Guided Injection of left shoulder glenohumeral joint posterior approach Device: Philips Affiniti 50G/GE Logiq Images permanently stored and available for review in PACS Verbal informed consent obtained.  Discussed risks and benefits of procedure. Warned about infection, bleeding, hyperglycemia damage to structures among others. Patient expresses understanding and agreement Time-out conducted.  Noted no overlying erythema, induration, or other signs of local infection.  Skin prepped in a sterile fashion.  Local anesthesia: Topical Ethyl chloride.  With sterile technique and under real time ultrasound guidance: 40 mg of Kenalog and 2 mL's of Marcaine injected into  glenohumeral joint. Fluid seen entering the joint capsule.  Completed without difficulty  Pain immediately resolved suggesting accurate placement of the medication.  Advised to call if fevers/chills, erythema, induration, drainage, or persistent bleeding.  Images permanently stored and available for review in the ultrasound unit. Impression: Technically successful ultrasound guided injection    ASSESSMENT & PLAN Steven [name] 89 y.o. male with medical history significant for iron deficiency anemia who presents for a follow up visit.  After review of the labs and discussion with the patient the findings are most consistent with continued iron deficiency anemia.  GI evaluations: EGD 03/11/2017: normal stomach and esophagus. Mild duodenal stenosis Colonoscopy 11/17/2013: moderate diverticulosis  At this time there is concern for recurrent GI bleed.  His hemoglobin has trended downward from 11.4 on 06/03/2021 to 8.2 on 09/25/2021.  GI loss is the most likely etiology.  He has been evaluated by gastroenterology who noted that endoscopic intervention is not indicated at this time.  #Normocytic Anemia # Iron Deficiency Anemia of Unclear Etiology -- labs today show white blood cell 11.0, hemoglobin 1.4, MCV 96.7, platelets of 282.  Ferritin of 53 with iron sat of 48% --previously patient has to stop his ferrous sulfate 325mg  PO daily due to recurrent SBOs, as recommended by his surgical team.  --Patient stopped his ferrous sulfate 325 mg p.o. daily.  Previously he was tolerating it well. --repeat Iron panel, ferritin, and reticulocyte panel today.  --will recommend IV iron to the patient if iron levels remain low  --Mr. Schild noted he had negative FOB cards and was evaluated by gastroenterology who declined to perform endoscopic intervention. --RTC 6 months with interval 30-month labs  No orders of the defined types were placed in this encounter.  All questions were answered. The patient knows to call the  clinic with any problems, questions or concerns.  A total of more than 30 minutes were spent on this encounter and over half of that time was spent on counseling and coordination of care as outlined above.   Ulysees Barns, MD Department of Hematology/Oncology [facility] Phone: [phone] Pager: [phone] Email: [email or web address]  05/04/2023 3:18 PM

## 2023-05-03 ENCOUNTER — Inpatient Hospital Stay: Payer: Medicare Other | Admitting: Hematology and Oncology

## 2023-05-03 ENCOUNTER — Inpatient Hospital Stay: Payer: Medicare Other | Attending: Internal Medicine

## 2023-05-03 VITALS — BP 137/62 | HR 73 | Temp 98.4°F | Resp 15 | Wt 121.4 lb

## 2023-05-03 DIAGNOSIS — Z803 Family history of malignant neoplasm of breast: Secondary | ICD-10-CM | POA: Diagnosis not present

## 2023-05-03 DIAGNOSIS — D509 Iron deficiency anemia, unspecified: Secondary | ICD-10-CM | POA: Diagnosis not present

## 2023-05-03 DIAGNOSIS — Z87891 Personal history of nicotine dependence: Secondary | ICD-10-CM | POA: Insufficient documentation

## 2023-05-03 DIAGNOSIS — D500 Iron deficiency anemia secondary to blood loss (chronic): Secondary | ICD-10-CM | POA: Diagnosis not present

## 2023-05-03 DIAGNOSIS — Z800 Family history of malignant neoplasm of digestive organs: Secondary | ICD-10-CM | POA: Diagnosis not present

## 2023-05-03 DIAGNOSIS — D649 Anemia, unspecified: Secondary | ICD-10-CM | POA: Diagnosis not present

## 2023-05-03 LAB — CMP (CANCER CENTER ONLY)
ALT: 14 U/L (ref 0–44)
AST: 17 U/L (ref 15–41)
Albumin: 3.8 g/dL (ref 3.5–5.0)
Alkaline Phosphatase: 61 U/L (ref 38–126)
Anion gap: 4 — ABNORMAL LOW (ref 5–15)
BUN: 17 mg/dL (ref 8–23)
CO2: 32 mmol/L (ref 22–32)
Calcium: 9.4 mg/dL (ref 8.9–10.3)
Chloride: 102 mmol/L (ref 98–111)
Creatinine: 0.77 mg/dL (ref 0.61–1.24)
GFR, Estimated: >60 mL/min (ref >60)
Glucose, Bld: 134 mg/dL — ABNORMAL HIGH (ref 70–99)
Potassium: 4 mmol/L (ref 3.5–5.1)
Sodium: 138 mmol/L (ref 135–145)
Total Bilirubin: 0.6 mg/dL (ref 0.0–1.2)
Total Protein: 6.9 g/dL (ref 6.5–8.1)

## 2023-05-03 LAB — IRON AND IRON BINDING CAPACITY (CC-WL,HP ONLY)
Iron: 166 ug/dL (ref 45–182)
Saturation Ratios: 48 % — ABNORMAL HIGH (ref 17.9–39.5)
TIBC: 344 ug/dL (ref 250–450)
UIBC: 178 ug/dL (ref 117–376)

## 2023-05-03 LAB — RETIC PANEL
Immature Retic Fract: 8.9 % (ref 2.3–15.9)
RBC.: 3.64 MIL/uL — ABNORMAL LOW (ref 4.22–5.81)
Retic Count, Absolute: 52.8 10*3/uL (ref 19.0–186.0)
Retic Ct Pct: 1.5 % (ref 0.4–3.1)
Reticulocyte Hemoglobin: 37.3 pg (ref >27.9)

## 2023-05-03 LAB — CBC WITH DIFFERENTIAL (CANCER CENTER ONLY)
Abs Immature Granulocytes: 0.03 10*3/uL (ref 0.00–0.07)
Basophils Absolute: 0.1 10*3/uL (ref 0.0–0.1)
Basophils Relative: 1 %
Eosinophils Absolute: 0.3 10*3/uL (ref 0.0–0.5)
Eosinophils Relative: 3 %
HCT: 34.7 % — ABNORMAL LOW (ref 39.0–52.0)
Hemoglobin: 11.4 g/dL — ABNORMAL LOW (ref 13.0–17.0)
Immature Granulocytes: 0 %
Lymphocytes Relative: 11 %
Lymphs Abs: 1.2 10*3/uL (ref 0.7–4.0)
MCH: 31.8 pg (ref 26.0–34.0)
MCHC: 32.9 g/dL (ref 30.0–36.0)
MCV: 96.7 fL (ref 80.0–100.0)
Monocytes Absolute: 0.7 10*3/uL (ref 0.1–1.0)
Monocytes Relative: 7 %
Neutro Abs: 8.7 10*3/uL — ABNORMAL HIGH (ref 1.7–7.7)
Neutrophils Relative %: 78 %
Platelet Count: 282 10*3/uL (ref 150–400)
RBC: 3.59 MIL/uL — ABNORMAL LOW (ref 4.22–5.81)
RDW: 14.4 % (ref 11.5–15.5)
WBC Count: 11 10*3/uL — ABNORMAL HIGH (ref 4.0–10.5)
nRBC: 0 % (ref 0.0–0.2)

## 2023-05-04 ENCOUNTER — Encounter: Payer: Self-pay | Admitting: Hematology and Oncology

## 2023-05-04 LAB — FERRITIN: Ferritin: 53 ng/mL (ref 24–336)

## 2023-05-06 DIAGNOSIS — E119 Type 2 diabetes mellitus without complications: Secondary | ICD-10-CM | POA: Diagnosis not present

## 2023-05-11 DIAGNOSIS — R1312 Dysphagia, oropharyngeal phase: Secondary | ICD-10-CM | POA: Diagnosis not present

## 2023-05-11 DIAGNOSIS — R682 Dry mouth, unspecified: Secondary | ICD-10-CM | POA: Diagnosis not present

## 2023-05-11 DIAGNOSIS — M459 Ankylosing spondylitis of unspecified sites in spine: Secondary | ICD-10-CM | POA: Diagnosis not present

## 2023-05-19 DIAGNOSIS — L578 Other skin changes due to chronic exposure to nonionizing radiation: Secondary | ICD-10-CM | POA: Diagnosis not present

## 2023-05-19 DIAGNOSIS — Z85828 Personal history of other malignant neoplasm of skin: Secondary | ICD-10-CM | POA: Diagnosis not present

## 2023-05-19 DIAGNOSIS — L570 Actinic keratosis: Secondary | ICD-10-CM | POA: Diagnosis not present

## 2023-05-28 ENCOUNTER — Other Ambulatory Visit: Payer: Self-pay | Admitting: *Deleted

## 2023-05-28 DIAGNOSIS — I6529 Occlusion and stenosis of unspecified carotid artery: Secondary | ICD-10-CM

## 2023-06-10 ENCOUNTER — Ambulatory Visit: Payer: Medicare Other | Admitting: Physician Assistant

## 2023-06-10 ENCOUNTER — Ambulatory Visit (HOSPITAL_COMMUNITY)
Admission: RE | Admit: 2023-06-10 | Discharge: 2023-06-10 | Disposition: A | Discharge status: Home / Self Care | Payer: Medicare Other | Source: Ambulatory Visit | Attending: Vascular Surgery | Admitting: Vascular Surgery

## 2023-06-10 VITALS — BP 96/62 | HR 63 | Temp 98.7°F | Ht 67.0 in | Wt 120.2 lb

## 2023-06-10 DIAGNOSIS — I6529 Occlusion and stenosis of unspecified carotid artery: Secondary | ICD-10-CM

## 2023-06-10 NOTE — Progress Notes (Signed)
 History of Present Illness:  Patient is a 89 y.o. year old male who presents for evaluation of carotid stenosis.  He is s/p left TCAR for symptomatic left ICA stenosis.  The patient denies symptoms of TIA, amaurosis, dysarthria, or other stroke symptoms.    He continues to take ASA, Plavix and Statin daily for maximum medical therapy.     Past Medical History:  Diagnosis Date   Allergy    enviromental   Anemia 2017   Anxiety    Arthritis    ankylosing spodilitis   Blood transfusion without reported diagnosis    during surgery   Cataract    Depression    Dyspnea    with exertion - had Echo done 09/29/16   History of kidney stones    Hyperlipidemia    Intestinal obstruction (HCC)    Pneumonia    as a child    Past Surgical History:  Procedure Laterality Date   ABDOMINAL SURGERY     had abcess   APPENDECTOMY     CHOLECYSTECTOMY     with lysis of adhesions   COLONOSCOPY     COLOSTOMY     COLOSTOMY REVERSAL     EYE SURGERY Left    scar tissue removed from cornea   EYE SURGERY Right    cataract surgery with lens implant   JOINT REPLACEMENT Right    hip  x 2 1999 and 2007   SHOULDER ARTHROSCOPY WITH ROTATOR CUFF REPAIR AND SUBACROMIAL DECOMPRESSION Left 03/02/2013   Procedure: LEFT SHOULDER ARTHROSCOPY WITH SUBACROMIAL DECOMPRESSION DISTAL CALVICLE RESECTION AND ROTATOR CUFF REPAIR ;  Surgeon: Senaida Lange, MD;  Location: [facility] OR;  Service: Orthopedics;  Laterality: Left;   TOTAL HIP REVISION Right 11/06/2016   Procedure: TOTAL HIP REVISION OF THE ACETABULAR COMPONENT;  Surgeon: Gean Birchwood, MD;  Location: [facility] OR;  Service: Orthopedics;  Laterality: Right;   TRANSCAROTID ARTERY REVASCULARIZATION  Left 03/31/2021   Procedure: TRANSCAROTID ARTERY REVASCULARIZATION;  Surgeon: Victorino Sparrow, MD;  Location: [facility] OR;  Service: Vascular;  Laterality: Left;   ULTRASOUND GUIDANCE FOR VASCULAR ACCESS Right 03/31/2021   Procedure: ULTRASOUND GUIDANCE FOR VASCULAR ACCESS;   Surgeon: Victorino Sparrow, MD;  Location: [facility] OR;  Service: Vascular;  Laterality: Right;     Social History Social History   Tobacco Use   Smoking status: Former    Current packs/day: 0.00    Average packs/day: 1 pack/day for 9.0 years (9.0 ttl pk-yrs)    Types: Cigarettes    Start date: 04/13/1956    Quit date: 04/13/1965    Years since quitting: 58.1    Passive exposure: Never   Smokeless tobacco: Never  Vaping Use   Vaping status: Never Used  Substance Use Topics   Alcohol use: Yes    Comment: 3 beers or glasses of wine a day--reports stopped ETOH 11/01/16    Drug use: No    Family History Family History  Problem Relation Age of Onset   Heart attack Mother    Diabetes Mother    Breast cancer Mother    Heart attack Maternal Grandfather    Pancreatic cancer Brother    Colon cancer Neg Hx    Esophageal cancer Neg Hx    Stomach cancer Neg Hx     Allergies  Allergies  Allergen Reactions   Indomethacin Hives   Lactose Intolerance (Gi) Nausea And Vomiting and Other (See Comments)    GI UPSET  Current Outpatient Medications  Medication Sig Dispense Refill   acetaminophen (TYLENOL) 325 MG tablet Take 325-650 mg by mouth every 6 (six) hours as needed for mild pain (pain score 1-3) or headache.     Artificial Tear Solution (SOOTHE XP) SOLN Place 1 drop into both eyes 2 (two) times daily as needed (for dryness).     aspirin EC 325 MG EC tablet Take 1 tablet (325 mg total) by mouth daily. 30 tablet 1   Biotin 5000 MCG TABS Take 5,000 mcg by mouth daily.     Calcium Carb-Cholecalciferol (CALCIUM+D3 PO) Take 2 tablets by mouth 2 (two) times daily with a meal.     Calcium Citrate 250 MG TABS Take 250 mg by mouth daily.     Cholecalciferol (VITAMIN D3) 1000 units CAPS Take 2,000 Units by mouth daily.     clopidogrel (PLAVIX) 75 MG tablet Take 1 tablet (75 mg total) by mouth daily. (Patient taking differently: Take 75 mg by mouth at bedtime.) 30 tablet 1   cyanocobalamin  (VITAMIN B12) 1000 MCG tablet Take 1,000 mcg by mouth daily.     fluoruracil (CARAC) 0.5 % cream Apply 1 application  topically daily as needed (for cancer treatment- as directed to affected areas).     folic acid (FOLVITE) 1 MG tablet Take 1 mg by mouth in the morning and at bedtime.     GEMTESA 75 MG TABS Take 75 mg by mouth at bedtime. (Patient not taking: Reported on 06/10/2023)     ipratropium (ATROVENT) 0.06 % nasal spray Place 2 sprays into both nostrils 2 (two) times daily as needed (allergies).     lactose free nutrition (BOOST) LIQD Take 237 mLs by mouth 2 (two) times daily between meals.     melatonin 5 MG TABS Take 5 mg by mouth at bedtime. (Patient not taking: Reported on 06/10/2023)     methotrexate (RHEUMATREX) 2.5 MG tablet Take 20 mg by mouth every Sunday.     Multiple Vitamins-Minerals (PRESERVISION AREDS 2 PO) Take 1 capsule by mouth in the morning and at bedtime.     oxyCODONE (ROXICODONE) 5 MG immediate release tablet Take 1 tablet (5 mg total) by mouth every 6 (six) hours as needed for severe pain (pain score 7-10). (Patient not taking: Reported on 04/19/2023) 10 tablet 0   pantoprazole (PROTONIX) 40 MG tablet Take 1 tablet (40 mg total) by mouth daily. (Patient taking differently: Take 40 mg by mouth daily before breakfast.) 30 tablet 1   polyethylene glycol (MIRALAX / GLYCOLAX) 17 g packet Take 17 g by mouth daily.     rosuvastatin (CRESTOR) 20 MG tablet Take 1 tablet (20 mg total) by mouth at bedtime. 30 tablet 1   senna-docusate (SENOKOT-S) 8.6-50 MG tablet Take 1 tablet by mouth daily.     sodium chloride 1 g tablet Take 2 tablets (2 g total) by mouth 3 (three) times daily with meals. (Patient not taking: Reported on 06/10/2023) 180 tablet 0   No current facility-administered medications for this visit.    ROS:   General:  No weight loss, Fever, chills  HEENT: No recent headaches, no nasal bleeding, no visual changes, no sore throat  Neurologic: No dizziness, blackouts,  seizures. No recent symptoms of stroke or mini- stroke. No recent episodes of slurred speech, or temporary blindness.  Cardiac: No recent episodes of chest pain/pressure, no shortness of breath at rest.  No shortness of breath with exertion.  Denies history of atrial fibrillation or irregular heartbeat  Vascular: No history of rest pain in feet.  No history of claudication.  No history of non-healing ulcer, No history of DVT   Pulmonary: No home oxygen, no productive cough, no hemoptysis,  No asthma or wheezing  Musculoskeletal:  [ ]  Arthritis, [ ]  Low back pain,  [ ]  Joint pain  Hematologic:No history of hypercoagulable state.  No history of easy bleeding.  No history of anemia  Gastrointestinal: No hematochezia or melena,  No gastroesophageal reflux, no trouble swallowing  Urinary: [ ]  chronic Kidney disease, [ ]  on HD - [ ]  MWF or [ ]  TTHS, [ ]  Burning with urination, [ ]  Frequent urination, [ ]  Difficulty urinating;   Skin: No rashes  Psychological: No history of anxiety,  No history of depression   Physical Examination  Vitals:   06/10/23 1440  BP: 96/62  Pulse: 63  Temp: 98.7 F (37.1 C)  SpO2: 93%  Weight: 120 lb 3.2 oz (54.5 kg)  Height: 5\' 7"  (1.702 m)    Body mass index is 18.83 kg/m.  General:  Alert and oriented, no acute distress HEENT: Normal Neck: No bruit or JVD Pulmonary: Clear to auscultation bilaterally Cardiac: Regular Rate and Rhythm without murmur Gastrointestinal: Soft, non-tender, non-distended, no mass, no scars Skin: No rash Extremity Pulses:   radial, dorsalis pedis, posterior tibial pulses bilaterally Musculoskeletal: No deformity or edema  Neurologic: Upper and lower extremity motor 5/5 and symmetric  DATA:  Right Carotid Findings:  +----------+--------+--------+--------+------------------+--------+           PSV cm/sEDV cm/sStenosisPlaque DescriptionComments  +----------+--------+--------+--------+------------------+--------+   CCA Prox  97      13                                          +----------+--------+--------+--------+------------------+--------+  CCA Mid   85      16                                          +----------+--------+--------+--------+------------------+--------+  CCA Distal101     14              heterogenous                +----------+--------+--------+--------+------------------+--------+  ICA Prox  115     20      1-39%   heterogenous                +----------+--------+--------+--------+------------------+--------+  ICA Mid   83      24                                          +----------+--------+--------+--------+------------------+--------+  ICA Distal80      19                                          +----------+--------+--------+--------+------------------+--------+  ECA      129     0               calcific                    +----------+--------+--------+--------+------------------+--------+   +----------+--------+-------+----------------+-------------------+  PSV cm/sEDV cmsDescribe        Arm Pressure (mmHG)  +----------+--------+-------+----------------+-------------------+  [id number]           Multiphasic, WNL                     +----------+--------+-------+----------------+-------------------+   +---------+--------+--+--------+-+---------+  VertebralPSV cm/s35EDV cm/s8Antegrade  +---------+--------+--+--------+-+---------+   Left Carotid Findings:  +----------+--------+--------+--------+------------------+-------------+           PSV cm/sEDV cm/sStenosisPlaque DescriptionComments       +----------+--------+--------+--------+------------------+-------------+  CCA Prox  107     18              heterogenous                     +----------+--------+--------+--------+------------------+-------------+  CCA Mid   85      13                                stent inflow    +----------+--------+--------+--------+------------------+-------------+  CCA Distal                                          stent          +----------+--------+--------+--------+------------------+-------------+  ICA Prox                                            stent          +----------+--------+--------+--------+------------------+-------------+  ICA Mid   106     18                                stent outflow  +----------+--------+--------+--------+------------------+-------------+  ICA Distal82      17                                               +----------+--------+--------+--------+------------------+-------------+  ECA      289     0       >50%    calcific                         +----------+--------+--------+--------+------------------+-------------+   +----------+--------+--------+----------------+-------------------+           PSV cm/sEDV cm/sDescribe        Arm Pressure (mmHG)  +----------+--------+--------+----------------+-------------------+  [id number]            Multiphasic, WNL                     +----------+--------+--------+----------------+-------------------+   +---------+--------+--+--------+--+---------+  VertebralPSV cm/s59EDV cm/s10Antegrade  +---------+--------+--+--------+--+---------+      Left Stent(s):  +------------------+--------+--------+--------+--------+---------------+  mid CCA to mid ICAPSV cm/sEDV cm/sStenosisWaveformComments         +------------------+--------+--------+--------+--------+---------------+  Prox to Stent     85      13                                       +------------------+--------+--------+--------+--------+---------------+  Proximal Stent  71      13                                       +------------------+--------+--------+--------+--------+---------------+  Mid Stent         172     14                      calcific plaque   +------------------+--------+--------+--------+--------+---------------+  Distal Stent      148     16                                       +------------------+--------+--------+--------+--------+---------------+  Distal to Stent   106     18                                       +------------------+--------+--------+--------+--------+---------------+     Summary:  Right Carotid: Velocities in the right ICA are consistent with a 1-39%  stenosis.   Left Carotid: Patent stent with no sidnificant stenosis.   Vertebrals:  Bilateral vertebral arteries demonstrate antegrade flow.  Subclavians: Normal flow hemodynamics were seen in bilateral subclavian               arteries.    ASSESSMENT/PLAN: History of Symptomatic carotid stenosis treated with TCAR on the left. He remains asymptomatic for new stroke/TIA symptoms.  He is medically managed on ASA, Plavix and Statin.  The duplex shows < 39% stenosis without re stenosis in the stent.  We reviewed symptoms and is these occur he will call 911.  Plan for surveillance f/u in 1 year with repeat carotid duplex.       Mosetta Pigeon PA-C [facility] Office: [phone]  MD in clinic [place]

## 2023-06-21 ENCOUNTER — Institutional Professional Consult (permissible substitution) (INDEPENDENT_AMBULATORY_CARE_PROVIDER_SITE_OTHER): Payer: Medicare Other | Admitting: Otolaryngology

## 2023-07-09 DIAGNOSIS — R1312 Dysphagia, oropharyngeal phase: Secondary | ICD-10-CM | POA: Diagnosis not present

## 2023-07-09 DIAGNOSIS — R42 Dizziness and giddiness: Secondary | ICD-10-CM | POA: Diagnosis not present

## 2023-07-09 DIAGNOSIS — R682 Dry mouth, unspecified: Secondary | ICD-10-CM | POA: Diagnosis not present

## 2023-07-09 DIAGNOSIS — M459 Ankylosing spondylitis of unspecified sites in spine: Secondary | ICD-10-CM | POA: Diagnosis not present

## 2023-07-15 DIAGNOSIS — L821 Other seborrheic keratosis: Secondary | ICD-10-CM | POA: Diagnosis not present

## 2023-07-15 DIAGNOSIS — L814 Other melanin hyperpigmentation: Secondary | ICD-10-CM | POA: Diagnosis not present

## 2023-07-15 DIAGNOSIS — Z85828 Personal history of other malignant neoplasm of skin: Secondary | ICD-10-CM | POA: Diagnosis not present

## 2023-07-15 DIAGNOSIS — L578 Other skin changes due to chronic exposure to nonionizing radiation: Secondary | ICD-10-CM | POA: Diagnosis not present

## 2023-07-15 DIAGNOSIS — L570 Actinic keratosis: Secondary | ICD-10-CM | POA: Diagnosis not present

## 2023-08-02 ENCOUNTER — Other Ambulatory Visit: Payer: Self-pay | Admitting: Hematology and Oncology

## 2023-08-02 ENCOUNTER — Inpatient Hospital Stay: Payer: Medicare Other | Attending: Internal Medicine

## 2023-08-02 DIAGNOSIS — D500 Iron deficiency anemia secondary to blood loss (chronic): Secondary | ICD-10-CM

## 2023-08-02 DIAGNOSIS — D509 Iron deficiency anemia, unspecified: Secondary | ICD-10-CM | POA: Insufficient documentation

## 2023-08-02 LAB — RETIC PANEL
Immature Retic Fract: 9.7 % (ref 2.3–15.9)
RBC.: 3.32 MIL/uL — ABNORMAL LOW (ref 4.22–5.81)
Retic Count, Absolute: 64.1 10*3/uL (ref 19.0–186.0)
Retic Ct Pct: 1.9 % (ref 0.4–3.1)
Reticulocyte Hemoglobin: 36 pg (ref >27.9)

## 2023-08-02 LAB — CMP (CANCER CENTER ONLY)
ALT: 20 U/L (ref 0–44)
AST: 23 U/L (ref 15–41)
Albumin: 3.8 g/dL (ref 3.5–5.0)
Alkaline Phosphatase: 54 U/L (ref 38–126)
Anion gap: 4 — ABNORMAL LOW (ref 5–15)
BUN: 14 mg/dL (ref 8–23)
CO2: 30 mmol/L (ref 22–32)
Calcium: 8.9 mg/dL (ref 8.9–10.3)
Chloride: 102 mmol/L (ref 98–111)
Creatinine: 0.65 mg/dL (ref 0.61–1.24)
GFR, Estimated: >60 mL/min (ref >60)
Glucose, Bld: 134 mg/dL — ABNORMAL HIGH (ref 70–99)
Potassium: 3.8 mmol/L (ref 3.5–5.1)
Sodium: 136 mmol/L (ref 135–145)
Total Bilirubin: 0.5 mg/dL (ref 0.0–1.2)
Total Protein: 7 g/dL (ref 6.5–8.1)

## 2023-08-02 LAB — CBC WITH DIFFERENTIAL (CANCER CENTER ONLY)
Abs Immature Granulocytes: 0.02 10*3/uL (ref 0.00–0.07)
Basophils Absolute: 0 10*3/uL (ref 0.0–0.1)
Basophils Relative: 1 %
Eosinophils Absolute: 0.2 10*3/uL (ref 0.0–0.5)
Eosinophils Relative: 3 %
HCT: 31.6 % — ABNORMAL LOW (ref 39.0–52.0)
Hemoglobin: 10.7 g/dL — ABNORMAL LOW (ref 13.0–17.0)
Immature Granulocytes: 0 %
Lymphocytes Relative: 15 %
Lymphs Abs: 1 10*3/uL (ref 0.7–4.0)
MCH: 32.3 pg (ref 26.0–34.0)
MCHC: 33.9 g/dL (ref 30.0–36.0)
MCV: 95.5 fL (ref 80.0–100.0)
Monocytes Absolute: 0.6 10*3/uL (ref 0.1–1.0)
Monocytes Relative: 9 %
Neutro Abs: 4.7 10*3/uL (ref 1.7–7.7)
Neutrophils Relative %: 72 %
Platelet Count: 233 10*3/uL (ref 150–400)
RBC: 3.31 MIL/uL — ABNORMAL LOW (ref 4.22–5.81)
RDW: 13.3 % (ref 11.5–15.5)
WBC Count: 6.5 10*3/uL (ref 4.0–10.5)
nRBC: 0 % (ref 0.0–0.2)

## 2023-08-02 LAB — IRON AND IRON BINDING CAPACITY (CC-WL,HP ONLY)
Iron: 176 ug/dL (ref 45–182)
Saturation Ratios: 55 % — ABNORMAL HIGH (ref 17.9–39.5)
TIBC: 321 ug/dL (ref 250–450)
UIBC: 145 ug/dL (ref 117–376)

## 2023-08-03 DIAGNOSIS — R3915 Urgency of urination: Secondary | ICD-10-CM | POA: Diagnosis not present

## 2023-08-03 LAB — FERRITIN: Ferritin: 57 ng/mL (ref 24–336)

## 2023-08-12 ENCOUNTER — Ambulatory Visit: Admitting: Family Medicine

## 2023-08-12 ENCOUNTER — Other Ambulatory Visit: Payer: Self-pay

## 2023-08-12 VITALS — BP 110/62 | HR 65 | Ht 67.0 in | Wt 124.0 lb

## 2023-08-12 DIAGNOSIS — M25512 Pain in left shoulder: Secondary | ICD-10-CM

## 2023-08-12 DIAGNOSIS — G8929 Other chronic pain: Secondary | ICD-10-CM

## 2023-08-12 NOTE — Patient Instructions (Signed)
Thank you for coming in today.   You received an injection today. Seek immediate medical attention if the joint becomes red, extremely painful, or is oozing fluid.   Let me know how this goes.

## 2023-08-12 NOTE — Progress Notes (Signed)
   Joanna Muck, PhD, LAT, ATC acting as a scribe for Garlan Juniper, MD.  Steven [name] is a 89 y.o. male who presents to [facility] at [facility] today for L shoulder pain. Pt was last seen by Dr. Alease Hunter 04/19/23 and was given a L GH steroid injection.   Today, pt c/o L shoulder pain ongoing since March. Pain is disturbing his sleep at night. He locates pain to the anterior aspect of his L shoulder.   Dx imaging:  Pertinent review of systems: No fevers or chills  Relevant historical information: History of stroke, history of ankylosing spondylitis.   Exam:  BP 110/62   Pulse 65   Ht 5\' 7"  (1.702 m)   Wt 124 lb (56.2 kg)   SpO2 98%   BMI 19.42 kg/m  General: Well Developed, well nourished, and in no acute distress.   MSK: Left shoulder decreased muscle mass and slightly rolled inwards.  Intact range of motion pain with abduction.    Lab and Radiology Results  Procedure: Real-time Ultrasound Guided Injection of left shoulder glenohumeral joint posterior approach Device: Philips Affiniti 50G/GE Logiq Images permanently stored and available for review in PACS Verbal informed consent obtained.  Discussed risks and benefits of procedure. Warned about infection, bleeding, hyperglycemia damage to structures among others. Patient expresses understanding and agreement Time-out conducted.   Noted no overlying erythema, induration, or other signs of local infection.   Skin prepped in a sterile fashion.   Local anesthesia: Topical Ethyl chloride.   With sterile technique and under real time ultrasound guidance: 40 mg of Kenalog  and 2 mL of Marcaine  injected into glenohumeral joint. Fluid seen entering the joint capsule.   Completed without difficulty   Pain immediately resolved suggesting accurate placement of the medication.   Advised to call if fevers/chills, erythema, induration, drainage, or persistent bleeding.   Images permanently stored and available for review  in the ultrasound unit.  Impression: Technically successful ultrasound guided injection.   EXAM: LEFT SHOULDER - 2+ VIEW   COMPARISON:  CT cervical spine 02/19/2023   FINDINGS: Moderate to severe inferior glenohumeral joint space narrowing and inferior osteophytosis. Probable postsurgical changes of partial distal clavicle excision. No acute fracture is seen. No dislocation. Moderate atherosclerotic calcifications within the aortic arch. Partial visualization of left carotid arterial stent as seen on prior CT.   IMPRESSION: Moderate to severe glenohumeral osteoarthritis.     Electronically Signed   By: Bertina Broccoli M.D.   On: 04/21/2023 23:32   I, Garlan Juniper, personally (independently) visualized and performed the interpretation of the images attached in this note.     Assessment and Plan: 89 y.o. male with chronic left shoulder pain.  Patient has moderate to severe glenohumeral arthritis on prior x-ray.  He also has degenerative rotator cuff tears very likely.  Plan for repeat steroid injection today.  Check back as needed.   PDMP not reviewed this encounter. Orders Placed This Encounter  Procedures   US  LIMITED JOINT SPACE STRUCTURES UP LEFT(NO LINKED CHARGES)    Reason for Exam (SYMPTOM  OR DIAGNOSIS REQUIRED):   left shoulder pain    Preferred imaging location?:   [facility]   No orders of the defined types were placed in this encounter.    Discussed warning signs or symptoms. Please see discharge instructions. Patient expresses understanding.   The above documentation has been reviewed and is accurate and complete Garlan Juniper, M.D.

## 2023-09-17 DIAGNOSIS — J3089 Other allergic rhinitis: Secondary | ICD-10-CM | POA: Diagnosis not present

## 2023-09-21 DIAGNOSIS — H35711 Central serous chorioretinopathy, right eye: Secondary | ICD-10-CM | POA: Diagnosis not present

## 2023-09-21 DIAGNOSIS — Z961 Presence of intraocular lens: Secondary | ICD-10-CM | POA: Diagnosis not present

## 2023-09-21 DIAGNOSIS — Z79899 Other long term (current) drug therapy: Secondary | ICD-10-CM | POA: Diagnosis not present

## 2023-09-21 DIAGNOSIS — M452 Ankylosing spondylitis of cervical region: Secondary | ICD-10-CM | POA: Diagnosis not present

## 2023-09-21 DIAGNOSIS — H44111 Panuveitis, right eye: Secondary | ICD-10-CM | POA: Diagnosis not present

## 2023-09-23 DIAGNOSIS — D509 Iron deficiency anemia, unspecified: Secondary | ICD-10-CM | POA: Diagnosis not present

## 2023-09-23 DIAGNOSIS — I959 Hypotension, unspecified: Secondary | ICD-10-CM | POA: Diagnosis not present

## 2023-09-24 DIAGNOSIS — Z79899 Other long term (current) drug therapy: Secondary | ICD-10-CM | POA: Diagnosis not present

## 2023-09-24 DIAGNOSIS — H44111 Panuveitis, right eye: Secondary | ICD-10-CM | POA: Diagnosis not present

## 2023-09-28 DIAGNOSIS — I6521 Occlusion and stenosis of right carotid artery: Secondary | ICD-10-CM | POA: Diagnosis not present

## 2023-09-28 DIAGNOSIS — M459 Ankylosing spondylitis of unspecified sites in spine: Secondary | ICD-10-CM | POA: Diagnosis not present

## 2023-09-28 DIAGNOSIS — R1312 Dysphagia, oropharyngeal phase: Secondary | ICD-10-CM | POA: Diagnosis not present

## 2023-09-28 DIAGNOSIS — E559 Vitamin D deficiency, unspecified: Secondary | ICD-10-CM | POA: Diagnosis not present

## 2023-09-28 DIAGNOSIS — D84821 Immunodeficiency due to drugs: Secondary | ICD-10-CM | POA: Diagnosis not present

## 2023-09-28 DIAGNOSIS — Z0000 Encounter for general adult medical examination without abnormal findings: Secondary | ICD-10-CM | POA: Diagnosis not present

## 2023-09-28 DIAGNOSIS — Z95828 Presence of other vascular implants and grafts: Secondary | ICD-10-CM | POA: Diagnosis not present

## 2023-09-28 DIAGNOSIS — R5383 Other fatigue: Secondary | ICD-10-CM | POA: Diagnosis not present

## 2023-09-28 DIAGNOSIS — I440 Atrioventricular block, first degree: Secondary | ICD-10-CM | POA: Diagnosis not present

## 2023-09-28 DIAGNOSIS — E871 Hypo-osmolality and hyponatremia: Secondary | ICD-10-CM | POA: Diagnosis not present

## 2023-09-28 DIAGNOSIS — E785 Hyperlipidemia, unspecified: Secondary | ICD-10-CM | POA: Diagnosis not present

## 2023-09-28 DIAGNOSIS — E118 Type 2 diabetes mellitus with unspecified complications: Secondary | ICD-10-CM | POA: Diagnosis not present

## 2023-10-07 DIAGNOSIS — M6281 Muscle weakness (generalized): Secondary | ICD-10-CM | POA: Diagnosis not present

## 2023-10-12 DIAGNOSIS — M6281 Muscle weakness (generalized): Secondary | ICD-10-CM | POA: Diagnosis not present

## 2023-10-13 DIAGNOSIS — E1122 Type 2 diabetes mellitus with diabetic chronic kidney disease: Secondary | ICD-10-CM | POA: Diagnosis not present

## 2023-10-19 DIAGNOSIS — M6281 Muscle weakness (generalized): Secondary | ICD-10-CM | POA: Diagnosis not present

## 2023-10-21 DIAGNOSIS — M6281 Muscle weakness (generalized): Secondary | ICD-10-CM | POA: Diagnosis not present

## 2023-10-28 ENCOUNTER — Telehealth: Payer: Self-pay | Admitting: Hematology and Oncology

## 2023-10-28 ENCOUNTER — Encounter (INDEPENDENT_AMBULATORY_CARE_PROVIDER_SITE_OTHER): Payer: Self-pay | Admitting: Otolaryngology

## 2023-10-28 DIAGNOSIS — M6281 Muscle weakness (generalized): Secondary | ICD-10-CM | POA: Diagnosis not present

## 2023-11-01 ENCOUNTER — Other Ambulatory Visit: Payer: Self-pay | Admitting: Hematology and Oncology

## 2023-11-01 ENCOUNTER — Inpatient Hospital Stay (HOSPITAL_BASED_OUTPATIENT_CLINIC_OR_DEPARTMENT_OTHER): Payer: Medicare Other | Admitting: Hematology and Oncology

## 2023-11-01 ENCOUNTER — Inpatient Hospital Stay: Payer: Medicare Other | Attending: Internal Medicine

## 2023-11-01 VITALS — BP 120/50 | HR 65 | Temp 97.7°F | Resp 18 | Ht 67.0 in | Wt 127.5 lb

## 2023-11-01 DIAGNOSIS — D649 Anemia, unspecified: Secondary | ICD-10-CM

## 2023-11-01 DIAGNOSIS — Z800 Family history of malignant neoplasm of digestive organs: Secondary | ICD-10-CM | POA: Diagnosis not present

## 2023-11-01 DIAGNOSIS — D509 Iron deficiency anemia, unspecified: Secondary | ICD-10-CM | POA: Diagnosis not present

## 2023-11-01 DIAGNOSIS — D500 Iron deficiency anemia secondary to blood loss (chronic): Secondary | ICD-10-CM

## 2023-11-01 DIAGNOSIS — Z87891 Personal history of nicotine dependence: Secondary | ICD-10-CM | POA: Insufficient documentation

## 2023-11-01 DIAGNOSIS — Z803 Family history of malignant neoplasm of breast: Secondary | ICD-10-CM | POA: Diagnosis not present

## 2023-11-01 LAB — CMP (CANCER CENTER ONLY)
ALT: 14 U/L (ref 0–44)
AST: 18 U/L (ref 15–41)
Albumin: 3.7 g/dL (ref 3.5–5.0)
Alkaline Phosphatase: 52 U/L (ref 38–126)
Anion gap: 4 — ABNORMAL LOW (ref 5–15)
BUN: 21 mg/dL (ref 8–23)
CO2: 31 mmol/L (ref 22–32)
Calcium: 8.9 mg/dL (ref 8.9–10.3)
Chloride: 101 mmol/L (ref 98–111)
Creatinine: 0.99 mg/dL (ref 0.61–1.24)
GFR, Estimated: >60 mL/min (ref >60)
Glucose, Bld: 119 mg/dL — ABNORMAL HIGH (ref 70–99)
Potassium: 4 mmol/L (ref 3.5–5.1)
Sodium: 136 mmol/L (ref 135–145)
Total Bilirubin: 0.6 mg/dL (ref 0.0–1.2)
Total Protein: 7.1 g/dL (ref 6.5–8.1)

## 2023-11-01 LAB — CBC WITH DIFFERENTIAL (CANCER CENTER ONLY)
Abs Immature Granulocytes: 0.04 K/uL (ref 0.00–0.07)
Basophils Absolute: 0 K/uL (ref 0.0–0.1)
Basophils Relative: 0 %
Eosinophils Absolute: 0.2 K/uL (ref 0.0–0.5)
Eosinophils Relative: 3 %
HCT: 31.8 % — ABNORMAL LOW (ref 39.0–52.0)
Hemoglobin: 10.7 g/dL — ABNORMAL LOW (ref 13.0–17.0)
Immature Granulocytes: 1 %
Lymphocytes Relative: 12 %
Lymphs Abs: 0.8 K/uL (ref 0.7–4.0)
MCH: 32.8 pg (ref 26.0–34.0)
MCHC: 33.6 g/dL (ref 30.0–36.0)
MCV: 97.5 fL (ref 80.0–100.0)
Monocytes Absolute: 0.7 K/uL (ref 0.1–1.0)
Monocytes Relative: 10 %
Neutro Abs: 5 K/uL (ref 1.7–7.7)
Neutrophils Relative %: 74 %
Platelet Count: 269 K/uL (ref 150–400)
RBC: 3.26 MIL/uL — ABNORMAL LOW (ref 4.22–5.81)
RDW: 13.6 % (ref 11.5–15.5)
WBC Count: 6.6 K/uL (ref 4.0–10.5)
nRBC: 0 % (ref 0.0–0.2)

## 2023-11-01 LAB — IRON AND IRON BINDING CAPACITY (CC-WL,HP ONLY)
Iron: 275 ug/dL — ABNORMAL HIGH (ref 45–182)
Saturation Ratios: 82 % — ABNORMAL HIGH (ref 17.9–39.5)
TIBC: 337 ug/dL (ref 250–450)
UIBC: 62 ug/dL — ABNORMAL LOW (ref 117–376)

## 2023-11-01 LAB — RETIC PANEL
Immature Retic Fract: 12.4 % (ref 2.3–15.9)
RBC.: 3.26 MIL/uL — ABNORMAL LOW (ref 4.22–5.81)
Retic Count, Absolute: 71.4 K/uL (ref 19.0–186.0)
Retic Ct Pct: 2.2 % (ref 0.4–3.1)
Reticulocyte Hemoglobin: 34 pg (ref >27.9)

## 2023-11-01 LAB — FERRITIN: Ferritin: 60 ng/mL (ref 24–336)

## 2023-11-01 NOTE — Progress Notes (Unsigned)
 [facility] [phone]   [phone]  PROGRESS NOTE  Patient Care Team: [facility], [facility] as PCP - General Steven Steven ONEIDA MADISON, MD as Consulting Physician (Hematology and Oncology)  Hematological/Oncological History #Normocytic Anemia #Iron Deficiency Anemia 1) 01/01/2017: WBC 5.1, Hgb 11.0, Plt 254, MCV 91.6 2) 12/07/2017: WBC 8.0, Hgb 11.9, Plt 352, MCV 91.4. Iron 76, TIBC 359, Sat 21%, Ferritin 31 3) 03/09/2019: WBC 14.3, Hgb 11.1, Plt 374, MCV 88.8 4) 05/22/2019: WBC 5.5, Hgb 10.7, Plt 311, MCV 83.7. Routine PCP visit. 5) 06/14/2019: establish care with Dr. Federico. WBC 5.0, Hgb 10.3, MCV 84.9, Plt 320 6) 09/14/2019: WBC 6.2, Hgb 10.9, MCV 91.2, Plt 209 7)  11/01/2019: WBC 9.2, Hgb 12.6, MVC 97.2, Plt 299 8) 11/05/2019: patient admitted with SBO, instructed to stop PO iron indefinitely.  9) 02/28/2020: admitted with SBO, Hgb drop to 9.6 while inpatient 10) 05/17/2020: Hgb 11.7, increased without intervention.  11) 09/05/2020: WBC 6.6, Hgb 10.9, MCV 94.6, Plt 259 12) 6/6-6/17/2022: IV feraheme  510 mg x 2 doses  13) 6/21-6/30/2023: IV feraheme  510 mg x 2 doses  14) 02/20/2022: WBC 5.8, Hgb 11.5, MCV 98.5, Plt 262  Interval History:  Steven [name] 89 y.o. male with medical history significant for iron deficiency anemia who presents for a follow up visit. The patient's last visit was on 05/03/2023. In the interim since the last visit Steven [name] has been at his baseline level of health.   On exam today Steven [name] notes he is a warm natured person and has been enjoying the summer heat.  He reports that he is doing his best to try to stay hydrated.  He notes over the last 3 months he has been well with no major changes in his health.  He reports that he was hospitalized in March and subsequently worked with nutritionist and physical therapy.  He reports his energy is about an 8 out of 10.  His appetite remains strong.  He denies any overt signs of bleeding,  bruising, or dark stools.  He notes that he is taking iron pills.  He is not aware of any dark stools or blood in his stool.  He reports that he has had no recent illnesses such as nausea, vomiting, or diarrhea.  He denies any lightheadedness, dizziness, shortness of breath.  He notes that he does get dizzy from time to time when standing up quickly.  Otherwise a full 10 point ROS was negative.  MEDICAL HISTORY:  Past Medical History:  Diagnosis Date   Allergy    enviromental   Anemia 2017   Anxiety    Arthritis    ankylosing spodilitis   Blood transfusion without reported diagnosis    during surgery   Cataract    Depression    Dyspnea    with exertion - had Echo done 09/29/16   History of kidney stones    Hyperlipidemia    Intestinal obstruction (HCC)    Pneumonia    as a child    SURGICAL HISTORY: Past Surgical History:  Procedure Laterality Date   ABDOMINAL SURGERY     had abcess   APPENDECTOMY     CHOLECYSTECTOMY     with lysis of adhesions   COLONOSCOPY     COLOSTOMY     COLOSTOMY REVERSAL     EYE SURGERY Left    scar tissue removed from cornea   EYE SURGERY Right    cataract surgery with lens implant   JOINT REPLACEMENT  Right    hip  x 2 1999 and 2007   SHOULDER ARTHROSCOPY WITH ROTATOR CUFF REPAIR AND SUBACROMIAL DECOMPRESSION Left 03/02/2013   Procedure: LEFT SHOULDER ARTHROSCOPY WITH SUBACROMIAL DECOMPRESSION DISTAL CALVICLE RESECTION AND ROTATOR CUFF REPAIR ;  Surgeon: Franky CHRISTELLA Pointer, MD;  Location: [facility] OR;  Service: Orthopedics;  Laterality: Left;   TOTAL HIP REVISION Right 11/06/2016   Procedure: TOTAL HIP REVISION OF THE ACETABULAR COMPONENT;  Surgeon: Liam Lerner, MD;  Location: [facility] OR;  Service: Orthopedics;  Laterality: Right;   TRANSCAROTID ARTERY REVASCULARIZATION  Left 03/31/2021   Procedure: TRANSCAROTID ARTERY REVASCULARIZATION;  Surgeon: Lanis Fonda BRAVO, MD;  Location: [facility] OR;  Service: Vascular;  Laterality: Left;   ULTRASOUND GUIDANCE FOR VASCULAR  ACCESS Right 03/31/2021   Procedure: ULTRASOUND GUIDANCE FOR VASCULAR ACCESS;  Surgeon: Lanis Fonda BRAVO, MD;  Location: [facility] OR;  Service: Vascular;  Laterality: Right;    SOCIAL HISTORY: Social History   Socioeconomic History   Marital status: Married    Spouse name: Not on file   Number of children: 2   Years of education: Not on file   Highest education level: Not on file  Occupational History   Occupation: retired  Tobacco Use   Smoking status: Former    Current packs/day: 0.00    Average packs/day: 1 pack/day for 9.0 years (9.0 ttl pk-yrs)    Types: Cigarettes    Start date: 04/13/1956    Quit date: 04/13/1965    Years since quitting: 58.5    Passive exposure: Never   Smokeless tobacco: Never  Vaping Use   Vaping status: Never Used  Substance and Sexual Activity   Alcohol  use: Yes    Comment: 3 beers or glasses of wine a day--reports stopped ETOH 11/01/16    Drug use: No   Sexual activity: Never  Other Topics Concern   Not on file  Social History Narrative   Not on file   Social Drivers of Health   Financial Resource Strain: Low Risk  (03/09/2019)   Overall Financial Resource Strain (CARDIA)    Difficulty of Paying Living Expenses: Not hard at all  Food Insecurity: No Food Insecurity (03/17/2023)   Hunger Vital Sign    Worried About Running Out of Food in the Last Year: Never true    Ran Out of Food in the Last Year: Never true  Transportation Needs: No Transportation Needs (03/17/2023)   PRAPARE - Administrator, Civil Service (Medical): No    Lack of Transportation (Non-Medical): No  Physical Activity: Insufficiently Active (03/09/2019)   Exercise Vital Sign    Days of Exercise per Week: 5 days    Minutes of Exercise per Session: 10 min  Stress: No Stress Concern Present (03/09/2019)   [employer] of Occupational Health - Occupational Stress Questionnaire    Feeling of Stress : Not at all  Social Connections: Unknown (03/09/2019)   Social  Connection and Isolation Panel    Frequency of Communication with Friends and Family: Patient declined    Frequency of Social Gatherings with Friends and Family: Patient declined    Attends Religious Services: Patient declined    Database administrator or Organizations: Patient declined    Attends Banker Meetings: Patient declined    Marital Status: Patient declined  Intimate Partner Violence: Not At Risk (03/17/2023)   Humiliation, Afraid, Rape, and Kick questionnaire    Fear of Current or Ex-Partner: No    Emotionally Abused: No  Physically Abused: No    Sexually Abused: No    FAMILY HISTORY: Family History  Problem Relation Age of Onset   Heart attack Mother    Diabetes Mother    Breast cancer Mother    Heart attack Maternal Grandfather    Pancreatic cancer Brother    Colon cancer Neg Hx    Esophageal cancer Neg Hx    Stomach cancer Neg Hx     ALLERGIES:  is allergic to indomethacin and lactose intolerance (gi).  MEDICATIONS:  Current Outpatient Medications  Medication Sig Dispense Refill   Artificial Tear Solution (SOOTHE XP) SOLN Place 1 drop into both eyes 2 (two) times daily as needed (for dryness).     aspirin  EC 325 MG EC tablet Take 1 tablet (325 mg total) by mouth daily. 30 tablet 1   Biotin  5000 MCG TABS Take 5,000 mcg by mouth daily.     Calcium  Carb-Cholecalciferol  (CALCIUM +D3 PO) Take 2 tablets by mouth 2 (two) times daily with a meal.     Calcium  Citrate 250 MG TABS Take 250 mg by mouth daily.     Cholecalciferol  (VITAMIN D3) 1000 units CAPS Take 2,000 Units by mouth daily.     clopidogrel  (PLAVIX ) 75 MG tablet Take 1 tablet (75 mg total) by mouth daily. (Patient taking differently: Take 75 mg by mouth at bedtime.) 30 tablet 1   cyanocobalamin  (VITAMIN B12) 1000 MCG tablet Take 1,000 mcg by mouth daily.     fluoruracil (CARAC) 0.5 % cream Apply 1 application  topically daily as needed (for cancer treatment- as directed to affected areas).      folic acid  (FOLVITE ) 1 MG tablet Take 1 mg by mouth in the morning and at bedtime.     GEMTESA 75 MG TABS Take 75 mg by mouth at bedtime. (Patient not taking: Reported on 06/10/2023)     ipratropium (ATROVENT ) 0.06 % nasal spray Place 2 sprays into both nostrils 2 (two) times daily as needed (allergies).     melatonin 5 MG TABS Take 5 mg by mouth at bedtime. (Patient not taking: Reported on 06/10/2023)     methotrexate  (RHEUMATREX) 2.5 MG tablet Take 20 mg by mouth every Sunday.     Multiple Vitamins-Minerals (PRESERVISION AREDS 2 PO) Take 1 capsule by mouth in the morning and at bedtime.     pantoprazole  (PROTONIX ) 40 MG tablet Take 1 tablet (40 mg total) by mouth daily. (Patient taking differently: Take 40 mg by mouth daily before breakfast.) 30 tablet 1   rosuvastatin  (CRESTOR ) 20 MG tablet Take 1 tablet (20 mg total) by mouth at bedtime. 30 tablet 1   senna-docusate (SENOKOT-S) 8.6-50 MG tablet Take 1 tablet by mouth daily.     sodium chloride  1 g tablet Take 2 tablets (2 g total) by mouth 3 (three) times daily with meals. (Patient not taking: Reported on 06/10/2023) 180 tablet 0   No current facility-administered medications for this visit.    REVIEW OF SYSTEMS:   Constitutional: ( - ) fevers, ( - )  chills , ( - ) night sweats Eyes: ( - ) blurriness of vision, ( - ) double vision, ( - ) watery eyes Ears, nose, mouth, throat, and face: ( - ) mucositis, ( - ) sore throat Respiratory: ( - ) cough, ( - ) dyspnea, ( - ) wheezes Cardiovascular: ( - ) palpitation, ( - ) chest discomfort, ( - ) lower extremity swelling Gastrointestinal:  ( - ) nausea, ( - ) heartburn, ( - )  change in bowel habits Skin: ( - ) abnormal skin rashes Lymphatics: ( - ) new lymphadenopathy, ( - ) easy bruising Neurological: ( - ) numbness, ( - ) tingling, ( - ) new weaknesses Behavioral/Psych: ( - ) mood change, ( - ) new changes  All other systems were reviewed with the patient and are negative.  PHYSICAL  EXAMINATION: ECOG PERFORMANCE STATUS: 1 - Symptomatic but completely ambulatory  Vitals:   11/01/23 1500  BP: (!) 120/50  Pulse: 65  Resp: 18  Temp: 97.7 F (36.5 C)  SpO2: 98%   Filed Weights   11/01/23 1500  Weight: 127 lb 8 oz (57.8 kg)   GENERAL: well appearing elderly Caucasian male in NAD  SKIN: skin color, texture, turgor are normal, no rashes or significant lesions EYES: conjunctiva are pink and non-injected, sclera clear LUNGS: clear to auscultation and percussion with normal breathing effort HEART: regular rate & rhythm and no murmurs and no lower extremity edema ABDOMEN: soft, non-tender, non-distended, normal bowel sounds. No HSM appreciated. Musculoskeletal: no cyanosis of digits and no clubbing  PSYCH: alert & oriented x 3, fluent speech NEURO: no focal motor/sensory deficits  LABORATORY DATA:  I have reviewed the data as listed    Latest Ref Rng & Units 11/01/2023    2:27 PM 08/02/2023    2:48 PM 05/03/2023    2:51 PM  CBC  WBC 4.0 - 10.5 K/uL 6.6  6.5  11.0   Hemoglobin 13.0 - 17.0 g/dL 89.2  89.2  88.5   Hematocrit 39.0 - 52.0 % 31.8  31.6  34.7   Platelets 150 - 400 K/uL 269  233  282        Latest Ref Rng & Units 11/01/2023    2:27 PM 08/02/2023    2:48 PM 05/03/2023    2:51 PM  CMP  Glucose 70 - 99 mg/dL 880  865  865   BUN 8 - 23 mg/dL 21  14  17    Creatinine 0.61 - 1.24 mg/dL 9.00  9.34  9.22   Sodium 135 - 145 mmol/L 136  136  138   Potassium 3.5 - 5.1 mmol/L 4.0  3.8  4.0   Chloride 98 - 111 mmol/L 101  102  102   CO2 22 - 32 mmol/L 31  30  32   Calcium  8.9 - 10.3 mg/dL 8.9  8.9  9.4   Total Protein 6.5 - 8.1 g/dL 7.1  7.0  6.9   Total Bilirubin 0.0 - 1.2 mg/dL 0.6  0.5  0.6   Alkaline Phos 38 - 126 U/L 52  54  61   AST 15 - 41 U/L 18  23  17    ALT 0 - 44 U/L 14  20  14      No results found for: MPROTEIN Lab Results  Component Value Date   KPAFRELGTCHN 16.5 06/14/2019   LAMBDASER 14.3 06/14/2019   KAPLAMBRATIO 1.15 06/14/2019     RADIOGRAPHIC STUDIES: No results found.   ASSESSMENT & PLAN Steven [name] 89 y.o. male with medical history significant for iron deficiency anemia who presents for a follow up visit.  After review of the labs and discussion with the patient the findings are most consistent with continued iron deficiency anemia.  GI evaluations: EGD 03/11/2017: normal stomach and esophagus. Mild duodenal stenosis Colonoscopy 11/17/2013: moderate diverticulosis  At this time there is concern for recurrent GI bleed.  His hemoglobin has trended downward from 11.4 on 06/03/2021 to 8.2  on 09/25/2021.  GI loss is the most likely etiology.  He has been evaluated by gastroenterology who noted that endoscopic intervention is not indicated at this time.  #Normocytic Anemia # Iron Deficiency Anemia of Unclear Etiology -- labs today show white blood cell 6.6, Hgb 10.7, MCV 97.5, Plt 269  --previously patient has to stop his ferrous sulfate  325mg  PO daily due to recurrent SBOs, as recommended by his surgical team.  --Patient stopped his ferrous sulfate  325 mg p.o. daily.  Previously he was tolerating it well. --repeat Iron panel, ferritin, and reticulocyte panel today.  --will recommend IV iron to the patient if iron levels remain low  --Steven [name] noted he had negative FOB cards and was evaluated by gastroenterology who declined to perform endoscopic intervention. --RTC 6 months with interval 63-month labs  No orders of the defined types were placed in this encounter.  All questions were answered. The patient knows to call the clinic with any problems, questions or concerns.  A total of more than 30 minutes were spent on this encounter and over half of that time was spent on counseling and coordination of care as outlined above.   Steven [name] Kidney, MD Department of Hematology/Oncology [facility] Phone: [phone] Pager: [phone] Email:  [email or web address]  11/02/2023 10:53 AM

## 2023-11-02 ENCOUNTER — Encounter: Payer: Self-pay | Admitting: Hematology and Oncology

## 2023-11-02 DIAGNOSIS — M6281 Muscle weakness (generalized): Secondary | ICD-10-CM | POA: Diagnosis not present

## 2023-11-09 DIAGNOSIS — M6281 Muscle weakness (generalized): Secondary | ICD-10-CM | POA: Diagnosis not present

## 2023-11-11 DIAGNOSIS — M6281 Muscle weakness (generalized): Secondary | ICD-10-CM | POA: Diagnosis not present

## 2023-11-16 DIAGNOSIS — M6281 Muscle weakness (generalized): Secondary | ICD-10-CM | POA: Diagnosis not present

## 2023-11-25 DIAGNOSIS — M6281 Muscle weakness (generalized): Secondary | ICD-10-CM | POA: Diagnosis not present

## 2023-11-30 DIAGNOSIS — M6281 Muscle weakness (generalized): Secondary | ICD-10-CM | POA: Diagnosis not present

## 2023-12-02 DIAGNOSIS — R3915 Urgency of urination: Secondary | ICD-10-CM | POA: Diagnosis not present

## 2023-12-06 DIAGNOSIS — Z0110 Encounter for examination of ears and hearing without abnormal findings: Secondary | ICD-10-CM | POA: Diagnosis not present

## 2023-12-08 DIAGNOSIS — E1122 Type 2 diabetes mellitus with diabetic chronic kidney disease: Secondary | ICD-10-CM | POA: Diagnosis not present

## 2023-12-09 DIAGNOSIS — M6281 Muscle weakness (generalized): Secondary | ICD-10-CM | POA: Diagnosis not present

## 2023-12-23 ENCOUNTER — Encounter: Payer: Self-pay | Admitting: Family Medicine

## 2023-12-23 ENCOUNTER — Ambulatory Visit (INDEPENDENT_AMBULATORY_CARE_PROVIDER_SITE_OTHER)

## 2023-12-23 ENCOUNTER — Ambulatory Visit: Admitting: Family Medicine

## 2023-12-23 ENCOUNTER — Other Ambulatory Visit: Payer: Self-pay

## 2023-12-23 VITALS — BP 118/62 | HR 70 | Ht 67.0 in | Wt 129.0 lb

## 2023-12-23 DIAGNOSIS — M25511 Pain in right shoulder: Secondary | ICD-10-CM | POA: Diagnosis not present

## 2023-12-23 DIAGNOSIS — M25512 Pain in left shoulder: Secondary | ICD-10-CM | POA: Diagnosis not present

## 2023-12-23 DIAGNOSIS — G8929 Other chronic pain: Secondary | ICD-10-CM | POA: Diagnosis not present

## 2023-12-23 DIAGNOSIS — M19011 Primary osteoarthritis, right shoulder: Secondary | ICD-10-CM | POA: Diagnosis not present

## 2023-12-23 NOTE — Progress Notes (Signed)
 I, Steven [name], CMA acting as a scribe for Steven Lloyd, MD.  Steven [name] is a 89 y.o. male who presents to [facility] today for exacerbation of his L shoulder pain. Pt was last seen by Dr. Lloyd on 08/12/23 and was given a L GH steroid injection  Today, pt reports exacerbation of left shoulder sx 2 months. Now having sx in the right shoulder. Limited ROM in bilat shoulders. Radiating pain into the left upper arm. Locates pain to anterior aspects. Denies n/t/w. Has been taking Acetaminophen  1000 mg nightly before bed. Some neck pain.   Dx imaging: 04/19/23 L shoulder XR  Pertinent review of systems: No fevers or chills  Relevant historical information: Shoulder arthritis   Exam:  BP 118/62   Pulse 70   Ht 5' 7 (1.702 m)   Wt 129 lb (58.5 kg)   SpO2 97%   BMI 20.20 kg/m  General: Well Developed, well nourished, and in no acute distress.   MSK: Left shoulder decreased range of motion.  Pain with abduction.  Right shoulder decreased range of motion.  Pain with abduction.    Lab and Radiology Results  Procedure: Real-time Ultrasound Guided Injection of left shoulder glenohumeral joint posterior approach Device: Philips Affiniti 50G/GE Logiq Images permanently stored and available for review in PACS Verbal informed consent obtained.  Discussed risks and benefits of procedure. Warned about infection, bleeding, hyperglycemia damage to structures among others. Patient expresses understanding and agreement Time-out conducted.   Noted no overlying erythema, induration, or other signs of local infection.   Skin prepped in a sterile fashion.   Local anesthesia: Topical Ethyl chloride.   With sterile technique and under real time ultrasound guidance: 40 mg of Kenalog  and 2 mL of Marcaine  injected into glenohumeral joint. Fluid seen entering the joint capsule.   Completed without difficulty   Pain immediately resolved suggesting accurate placement of  the medication.   Advised to call if fevers/chills, erythema, induration, drainage, or persistent bleeding.   Images permanently stored and available for review in the ultrasound unit.  Impression: Technically successful ultrasound guided injection.   X-ray images right shoulder obtained today personally and independently interpreted. Mild glenohumeral and moderate AC DJD.  No acute fractures. Await formal radiology review     Assessment and Plan: 89 y.o. male with bilateral chronic shoulder pain.  Left is much worse than right.  Left shoulder due to DJD.  Right shoulder some DJD is present.  Plan for left shoulder injection today.  Can repeat this injection in 3 months if needed.  Could inject the right shoulder whenever he would like us  to.   PDMP not reviewed this encounter. Orders Placed This Encounter  Procedures   US  LIMITED JOINT SPACE STRUCTURES UP LEFT(NO LINKED CHARGES)    Reason for Exam (SYMPTOM  OR DIAGNOSIS REQUIRED):   left shoulder pain    Preferred imaging location?:   [facility] Shoulder Right    Standing Status:   Future    Number of Occurrences:   1    Expiration Date:   12/22/2024    Reason for Exam (SYMPTOM  OR DIAGNOSIS REQUIRED):   right shoulder pain    Preferred imaging location?:    [place] [facility]   No orders of the defined types were placed in this encounter.    Discussed warning signs or symptoms. Please see discharge instructions. Patient expresses understanding.   The above documentation has been  reviewed and is accurate and complete Steven [name], M.D.

## 2023-12-23 NOTE — Patient Instructions (Addendum)
 Thank you for coming in today.   You received an injection today. Seek immediate medical attention if the joint becomes red, extremely painful, or is oozing fluid.   Please get an Xray today before you leave   We consider doing repeat injection in 12 weeks if needed.

## 2023-12-27 ENCOUNTER — Ambulatory Visit: Admitting: Family Medicine

## 2023-12-30 ENCOUNTER — Ambulatory Visit: Payer: Self-pay | Admitting: Family Medicine

## 2023-12-30 NOTE — Progress Notes (Signed)
Right shoulder x-ray shows mild arthritis

## 2024-01-04 ENCOUNTER — Encounter (INDEPENDENT_AMBULATORY_CARE_PROVIDER_SITE_OTHER): Payer: Self-pay | Admitting: Otolaryngology

## 2024-01-04 ENCOUNTER — Ambulatory Visit (INDEPENDENT_AMBULATORY_CARE_PROVIDER_SITE_OTHER): Admitting: Otolaryngology

## 2024-01-04 VITALS — BP 127/63 | HR 67 | Temp 97.5°F

## 2024-01-04 DIAGNOSIS — J383 Other diseases of vocal cords: Secondary | ICD-10-CM

## 2024-01-04 DIAGNOSIS — Z87891 Personal history of nicotine dependence: Secondary | ICD-10-CM

## 2024-01-04 DIAGNOSIS — R490 Dysphonia: Secondary | ICD-10-CM | POA: Diagnosis not present

## 2024-01-04 DIAGNOSIS — R1310 Dysphagia, unspecified: Secondary | ICD-10-CM

## 2024-01-04 NOTE — Progress Notes (Signed)
 ENT CONSULT:  Reason for Consult: chronic dysphagia    HPI: Discussed the use of AI scribe software for clinical note transcription with the patient, who gave verbal consent to proceed.  History of Present Illness Steven [name] is an 89 year old male who presents with trouble swallowing and voice changes/hoarseness he associates with getting older.  He experiences difficulty swallowing most types of food, describing a sensation of 'not enough room for it to go down.' Despite attempting to eat smaller portions, this has been ineffective, leading him to exclude certain foods from his diet.  He denies any weight loss and is actively trying to gain weight. He has not undergone any swallow studies previously. No coughing or choking occurs when drinking liquids.  His past medical history includes a transient ischemic attack (TIA) with carotid artery stent placement, and he is currently on Plavix , a blood thinner. He denies any changes in his voice other than those related to aging, and he has no history of heartburn, reflux, COPD, or lung disease.  He feels he sounds like an old individual and his voice is hoarse.  He recalls a previous comment from another doctor regarding potential spine issues affecting his swallowing, specifically mentioning 'close in spondylitis.'  Records Reviewed:  Office visit with Dr Federico Heme/Onc 11/01/23 Steven [name] 89 y.o. male with medical history significant for iron deficiency anemia who presents for a follow up visit. The patient's last visit was on 05/03/2023. In the interim since the last visit Steven [name] has been at his baseline level of health.     Past Medical History:  Diagnosis Date   Allergy    enviromental   Anemia 2017   Anxiety    Arthritis    ankylosing spodilitis   Blood transfusion without reported diagnosis    during surgery   Cataract    Depression    Dyspnea    with exertion - had Echo done 09/29/16   History of kidney stones     Hyperlipidemia    Intestinal obstruction (HCC)    Pneumonia    as a child    Past Surgical History:  Procedure Laterality Date   ABDOMINAL SURGERY     had abcess   APPENDECTOMY     CHOLECYSTECTOMY     with lysis of adhesions   COLONOSCOPY     COLOSTOMY     COLOSTOMY REVERSAL     EYE SURGERY Left    scar tissue removed from cornea   EYE SURGERY Right    cataract surgery with lens implant   JOINT REPLACEMENT Right    hip  x 2 1999 and 2007   SHOULDER ARTHROSCOPY WITH ROTATOR CUFF REPAIR AND SUBACROMIAL DECOMPRESSION Left 03/02/2013   Procedure: LEFT SHOULDER ARTHROSCOPY WITH SUBACROMIAL DECOMPRESSION DISTAL CALVICLE RESECTION AND ROTATOR CUFF REPAIR ;  Surgeon: Franky CHRISTELLA Pointer, MD;  Location: [facility] OR;  Service: Orthopedics;  Laterality: Left;   TOTAL HIP REVISION Right 11/06/2016   Procedure: TOTAL HIP REVISION OF THE ACETABULAR COMPONENT;  Surgeon: Liam Lerner, MD;  Location: [facility] OR;  Service: Orthopedics;  Laterality: Right;   TRANSCAROTID ARTERY REVASCULARIZATION  Left 03/31/2021   Procedure: TRANSCAROTID ARTERY REVASCULARIZATION;  Surgeon: Lanis Fonda BRAVO, MD;  Location: [facility] OR;  Service: Vascular;  Laterality: Left;   ULTRASOUND GUIDANCE FOR VASCULAR ACCESS Right 03/31/2021   Procedure: ULTRASOUND GUIDANCE FOR VASCULAR ACCESS;  Surgeon: Lanis Fonda BRAVO, MD;  Location: [facility] OR;  Service: Vascular;  Laterality: Right;    Family  History  Problem Relation Age of Onset   Heart attack Mother    Diabetes Mother    Breast cancer Mother    Heart attack Maternal Grandfather    Pancreatic cancer Brother    Colon cancer Neg Hx    Esophageal cancer Neg Hx    Stomach cancer Neg Hx     Social History:  reports that he quit smoking about 58 years ago. His smoking use included cigarettes. He started smoking about 67 years ago. He has a 9 pack-year smoking history. He has never been exposed to tobacco smoke. He has never used smokeless tobacco. He reports current alcohol  use. He reports that  he does not use drugs.  Allergies:  Allergies  Allergen Reactions   Indomethacin Hives   Lactose Intolerance (Gi) Nausea And Vomiting and Other (See Comments)    GI UPSET    Medications: I have reviewed the patient's current medications.  The PMH, PSH, Medications, Allergies, and SH were reviewed and updated.  ROS: Constitutional: Negative for fever, weight loss and weight gain. Cardiovascular: Negative for chest pain and dyspnea on exertion. Respiratory: Is not experiencing shortness of breath at rest. Gastrointestinal: Negative for nausea and vomiting. Neurological: Negative for headaches. Psychiatric: The patient is not nervous/anxious  Blood pressure 127/63, pulse 67, temperature (!) 97.5 F (36.4 C), SpO2 96%. There is no height or weight on file to calculate BMI.  PHYSICAL EXAM:  Exam: General: Well-developed, well-nourished Respiratory Respiratory effort: Equal inspiration and expiration without stridor Cardiovascular Peripheral Vascular: Warm extremities with equal color/perfusion Eyes: No nystagmus with equal extraocular motion bilaterally Neuro/Psych/Balance: Patient oriented to person, place, and time; Appropriate mood and affect; Gait is intact with no imbalance; Cranial nerves I-XII are intact Head and Face Inspection: Normocephalic and atraumatic without mass or lesion Palpation: Facial skeleton intact without bony stepoffs Salivary Glands: No mass or tenderness Facial Strength: Facial motility symmetric and full bilaterally ENT Pinna: External ear intact and fully developed External canal: Canal is patent with intact skin Tympanic Membrane: Clear and mobile External Nose: No scar or anatomic deformity Internal Nose: Septum is straight. No polyp, or purulence. Mucosal edema and erythema present.  Bilateral inferior turbinate hypertrophy.  Lips, Teeth, and gums: Mucosa and teeth intact and viable TMJ: No pain to palpation with full mobility Oral  cavity/oropharynx: No erythema or exudate, no lesions present Nasopharynx: No mass or lesion with intact mucosa Hypopharynx: Intact mucosa without pooling of secretions Larynx Glottic: Full true vocal cord mobility without lesion or mass Supraglottic: Normal appearing epiglottis and AE folds VF atrophy Interarytenoid Space: Moderate pachydermia&edema Subglottic Space: Patent without lesion or edema Neck Neck and Trachea: Midline trachea without mass or lesion Thyroid : No mass or nodularity Lymphatics: No lymphadenopathy  Procedure:  Preoperative diagnosis: hoarseness and dysphagia  Postoperative diagnosis:   same  Procedure: Flexible fiberoptic laryngoscopy with stroboscopy (68420)   Surgeon: Karalyne Nusser, MD  Anesthesia: Topical lidocaine  and Afrin  Complications: None  Condition is stable throughout exam  Indications and consent:   The patient presents to the clinic with hoarseness. All the risks, benefits, and potential complications were reviewed with the patient preoperatively and informed verbal consent was obtained.  Procedure: The patient was seated upright in the exam chair.   Topical lidocaine  and Afrin were applied to the nasal cavity. After adequate anesthesia had occurred, the flexible telescope with strobe capabilities was passed into the nasal cavity. The nasopharynx was patent without mass or lesion. The scope was passed behind the soft  palate and directed toward the base of tongue. The base of tongue was visualized and was symmetric with no apparent masses or abnormal appearing tissue. There were no signs of a mass or pooling of secretions in the piriform sinuses. The supraglottic structures were normal.  The true vocal cords are mobile. The medial edges were bowed. Closure was incomplete. Periodicity present. The mucosal wave and amplitude were intact and symmetric. There is moderate interarytenoid pachydermia and post cricoid edema. The mucosa appears without  lesions.   The laryngoscope was then slowly withdrawn and the patient tolerated the procedure well. There were no complications or blood loss.  Studies Reviewed: CXR 03/20/23 IMPRESSION: New or increased medial right lower lobe opacity, favoring pneumonia over atelectasis.   Underlying hyperinflation suggests COPD.  Assessment/Plan: Encounter Diagnoses  Name Primary?   Dysphagia, unspecified type Yes   Dysphonia    Hoarseness    Age-related vocal fold atrophy     Assessment and Plan Assessment & Plan Chronic Dysphagia Chronic dysphagia with difficulty swallowing solids and liquids. Differential includes esophageal vs oropharyngeal dysphagia. History of TIA with carotid stent. No prior swallow studies. Hx of ankylosing spondylitis affecting cervical spine.  - Order swallow study to assess swallowing. - Arrange follow-up to discuss swallow study results. - Consider swallow therapy if muscular weakness is identified.  Chronic dysphonia  Strobe today with VF atrophy and glottic insufficiency. No masses or lesions. VF mobile f/l.  - he is not bothered by his voice, monitor for now  Thank you for allowing me to participate in the care of this patient. Please do not hesitate to contact me with any questions or concerns.   Elena Larry, MD Otolaryngology [facility] Phone: [phone] Fax: [phone]    01/04/2024, 1:17 PM

## 2024-01-04 NOTE — Progress Notes (Signed)
 SABRA

## 2024-01-05 ENCOUNTER — Other Ambulatory Visit (HOSPITAL_COMMUNITY): Payer: Self-pay | Admitting: Otolaryngology

## 2024-01-05 DIAGNOSIS — R059 Cough, unspecified: Secondary | ICD-10-CM

## 2024-01-05 DIAGNOSIS — R1310 Dysphagia, unspecified: Secondary | ICD-10-CM

## 2024-01-14 ENCOUNTER — Ambulatory Visit (HOSPITAL_COMMUNITY)

## 2024-01-14 ENCOUNTER — Ambulatory Visit (HOSPITAL_COMMUNITY)
Admission: RE | Admit: 2024-01-14 | Discharge: 2024-01-14 | Disposition: A | Discharge status: Home / Self Care | Source: Ambulatory Visit | Attending: *Deleted | Admitting: *Deleted

## 2024-01-14 ENCOUNTER — Ambulatory Visit (HOSPITAL_COMMUNITY): Admission: RE | Admit: 2024-01-14 | Source: Ambulatory Visit

## 2024-01-14 DIAGNOSIS — R1310 Dysphagia, unspecified: Secondary | ICD-10-CM

## 2024-01-27 ENCOUNTER — Other Ambulatory Visit (HOSPITAL_COMMUNITY)

## 2024-01-27 ENCOUNTER — Encounter (HOSPITAL_COMMUNITY)

## 2024-01-27 DIAGNOSIS — L821 Other seborrheic keratosis: Secondary | ICD-10-CM | POA: Diagnosis not present

## 2024-01-27 DIAGNOSIS — L570 Actinic keratosis: Secondary | ICD-10-CM | POA: Diagnosis not present

## 2024-01-27 DIAGNOSIS — Z85828 Personal history of other malignant neoplasm of skin: Secondary | ICD-10-CM | POA: Diagnosis not present

## 2024-01-28 ENCOUNTER — Ambulatory Visit (HOSPITAL_COMMUNITY)
Admission: RE | Admit: 2024-01-28 | Discharge: 2024-01-28 | Disposition: A | Source: Ambulatory Visit | Attending: Otolaryngology

## 2024-01-28 ENCOUNTER — Ambulatory Visit (HOSPITAL_COMMUNITY)
Admission: RE | Admit: 2024-01-28 | Discharge: 2024-01-28 | Disposition: A | Discharge status: Home / Self Care | Source: Ambulatory Visit | Attending: Otolaryngology | Admitting: Otolaryngology

## 2024-01-28 ENCOUNTER — Ambulatory Visit (HOSPITAL_COMMUNITY)
Admission: RE | Admit: 2024-01-28 | Discharge: 2024-01-28 | Disposition: A | Discharge status: Home / Self Care | Source: Ambulatory Visit | Attending: *Deleted | Admitting: *Deleted

## 2024-01-28 DIAGNOSIS — R490 Dysphonia: Secondary | ICD-10-CM | POA: Insufficient documentation

## 2024-01-28 DIAGNOSIS — Z87891 Personal history of nicotine dependence: Secondary | ICD-10-CM | POA: Diagnosis not present

## 2024-01-28 DIAGNOSIS — Z8701 Personal history of pneumonia (recurrent): Secondary | ICD-10-CM | POA: Diagnosis not present

## 2024-01-28 DIAGNOSIS — R1310 Dysphagia, unspecified: Secondary | ICD-10-CM

## 2024-01-28 DIAGNOSIS — R1312 Dysphagia, oropharyngeal phase: Secondary | ICD-10-CM | POA: Diagnosis not present

## 2024-01-28 DIAGNOSIS — R059 Cough, unspecified: Secondary | ICD-10-CM | POA: Insufficient documentation

## 2024-01-28 DIAGNOSIS — R0989 Other specified symptoms and signs involving the circulatory and respiratory systems: Secondary | ICD-10-CM | POA: Diagnosis not present

## 2024-01-28 DIAGNOSIS — I700 Atherosclerosis of aorta: Secondary | ICD-10-CM | POA: Insufficient documentation

## 2024-01-28 NOTE — Progress Notes (Signed)
 Modified Barium Swallow Study  Patient Details  Name: Steven [name] MRN: [id number] Date of Birth: 1934-10-20  Today's Date: 01/28/2024  HPI/PMH: HPI: Steven [name] is an 89 year old male who presents with trouble swallowing and voice changes/hoarseness he associates with getting older.He experiences difficulty swallowing most types of food, describing a sensation of 'not enough room for it to go down.' Despite attempting to eat smaller portions, this has been ineffective per MD note. He states this leads to him excluding certain foods from his diet.   He denies any weight loss and is actively trying to gain weight. He has not undergone any swallow studies previously.  He underwent laryngoscopy with findings indicating medial of edges of vocal folds bowed, glottis insuffiency, without pooling of secretions.  His PMH also includes smoking - quit 58 years ago, spondylosis, TIA s/p Left CEA, Norovirus Dec 2022 with significant weight loss and deconditioning and falls requiring hospital and rehab stay. He reports his weight has stablized and he is actively working to gain more weight with an RD.  He denies dysphagia to pills.  He denies heartburn nor COPD.  Avoids oranges due to choking on them. Denies heimlich manuever nor having recurrent pneumonias.   States this dysphagia onset has been approximately one year - with gradual onset.   Clinical Impression: Clinical Impression: Patient with functional oropharyngeal dysphagia.  No aspiration or penetration of any consistency tested even when challenged to consume sequential liquids.  Mild vallecular retention with solid/honey thick noted that cleared with reflexive and liquid swallows.   He was observed to clear his throat post-swallow of honey thick liquids - without barium visualized in larynx. His swallow is WFL.  Noted pt to be clearing his throat after consumption of honey.  Did not provide pill due to pt having esophagram following this exam.  Of note,  pt reports he feels like his swallow becomes discoordindated at times. SLP discussed respiratory and swallow reciprocity.  Using teach back and reviewing MBS flouroscopy loops, pt educated to findings/recommendations.  Factors that may increase risk of adverse event in presence of aspiration Noe & Lianne 2021): No data recorded  Recommendations/Plan: Swallowing Evaluation Recommendations Swallowing Evaluation Recommendations Recommendations: PO diet PO Diet Recommendation: Regular; Thin liquids (Level 0) Liquid Administration via: Cup; Straw Medication Administration: Other (Comment) Supervision: Patient able to self-feed Swallowing strategies  : Slow rate; Small bites/sips Postural changes: Position pt fully upright for meals; Stay upright 30-60 min after meals Oral care recommendations: Oral care BID (2x/day)    Treatment Plan Treatment Plan Treatment recommendations: No treatment recommended at this time Follow-up recommendations: No SLP follow up     Recommendations Recommendations for follow up therapy are one component of a multi-disciplinary discharge planning process, led by the attending physician.  Recommendations may be updated based on patient status, additional functional criteria and insurance authorization.  Assessment: Orofacial Exam: Orofacial Exam Oral Cavity: Oral Hygiene: WFL    Anatomy:  Anatomy: Other (Comment) (patient with apparent stent)   Boluses Administered: Boluses Administered Boluses Administered: Mildly thick liquids (Level 2, nectar thick); Thin liquids (Level 0); Moderately thick liquids (Level 3, honey thick); Puree; Solid     Oral Impairment Domain: Oral Impairment Domain Lip Closure: No labial escape Tongue control during bolus hold: Cohesive bolus between tongue to palatal seal Bolus preparation/mastication: Slow prolonged chewing/mashing with complete recollection Bolus transport/lingual motion: Slow tongue motion Oral  residue: Trace residue lining oral structures Location of oral residue : Floor of mouth  Initiation of pharyngeal swallow : Valleculae     Pharyngeal Impairment Domain: Pharyngeal Impairment Domain Soft palate elevation: No bolus between soft palate (SP)/pharyngeal wall (PW) Laryngeal elevation: Complete superior movement of thyroid  cartilage with complete approximation of arytenoids to epiglottic petiole Anterior hyoid excursion: Complete anterior movement Epiglottic movement: Partial inversion Laryngeal vestibule closure: Complete, no air/contrast in laryngeal vestibule Pharyngeal stripping wave : Present - complete Pharyngeal contraction (A/P view only): N/A Pharyngoesophageal segment opening: Complete distension and complete duration, no obstruction of flow Tongue base retraction: No contrast between tongue base and posterior pharyngeal wall (PPW) Pharyngeal residue: Trace residue within or on pharyngeal structures Location of pharyngeal residue: Valleculae; Tongue base     Esophageal Impairment Domain: Esophageal Impairment Domain Esophageal clearance upright position: -- (DNT, pt for esophagram)    Pill: Pill Consistency administered: -- (DNT due to pt having an esophagram)    Penetration/Aspiration Scale Score: Penetration/Aspiration Scale Score 1.  Material does not enter airway: Thin liquids (Level 0); Mildly thick liquids (Level 2, nectar thick); Moderately thick liquids (Level 3, honey thick); Puree; Solid    Compensatory Strategies: Compensatory Strategies Compensatory strategies: No       General Information: Caregiver present: No   Diet Prior to this Study: Regular; Thin liquids (Level 0)    Temperature : -- (n/a)    No data recorded   Supplemental O2: None (Room air)    History of Recent Intubation: No   Behavior/Cognition: Alert; Cooperative; Pleasant mood  Self-Feeding Abilities: Able to self-feed  Baseline vocal quality/speech:  Normal  Volitional Cough: Able to elicit  Volitional Swallow: Able to elicit  Exam Limitations: No limitations   Goal Planning: No data recorded No data recorded No data recorded No data recorded No data recorded  Pain: No data recorded  End of Session: Start Time:SLP Start Time (ACUTE ONLY): 1319  Stop Time: SLP Stop Time (ACUTE ONLY): 1405  Time Calculation:SLP Time Calculation (min) (ACUTE ONLY): 46 min  Charges: SLP Evaluations $ SLP Speech Visit: 1 Visit  SLP Evaluations $Outpatient MBS Swallow: 1 Procedure $Swallowing Treatment: 1 Procedure   SLP visit diagnosis: SLP Visit Diagnosis: Dysphagia, oropharyngeal phase (R13.12)    Past Medical History:  Past Medical History:  Diagnosis Date   Allergy    enviromental   Anemia 2017   Anxiety    Arthritis    ankylosing spodilitis   Blood transfusion without reported diagnosis    during surgery   Cataract    Depression    Dyspnea    with exertion - had Echo done 09/29/16   History of kidney stones    Hyperlipidemia    Intestinal obstruction (HCC)    Pneumonia    as a child   Past Surgical History:  Past Surgical History:  Procedure Laterality Date   ABDOMINAL SURGERY     had abcess   APPENDECTOMY     CHOLECYSTECTOMY     with lysis of adhesions   COLONOSCOPY     COLOSTOMY     COLOSTOMY REVERSAL     EYE SURGERY Left    scar tissue removed from cornea   EYE SURGERY Right    cataract surgery with lens implant   JOINT REPLACEMENT Right    hip  x 2 1999 and 2007   SHOULDER ARTHROSCOPY WITH ROTATOR CUFF REPAIR AND SUBACROMIAL DECOMPRESSION Left 03/02/2013   Procedure: LEFT SHOULDER ARTHROSCOPY WITH SUBACROMIAL DECOMPRESSION DISTAL CALVICLE RESECTION AND ROTATOR CUFF REPAIR ;  Surgeon: Franky CHRISTELLA Pointer, MD;  Location:  [facility] OR;  Service: Orthopedics;  Laterality: Left;   TOTAL HIP REVISION Right 11/06/2016   Procedure: TOTAL HIP REVISION OF THE ACETABULAR COMPONENT;  Surgeon: Liam Lerner, MD;  Location:  [facility] OR;  Service: Orthopedics;  Laterality: Right;   TRANSCAROTID ARTERY REVASCULARIZATION  Left 03/31/2021   Procedure: TRANSCAROTID ARTERY REVASCULARIZATION;  Surgeon: Lanis Fonda BRAVO, MD;  Location: [facility] OR;  Service: Vascular;  Laterality: Left;   ULTRASOUND GUIDANCE FOR VASCULAR ACCESS Right 03/31/2021   Procedure: ULTRASOUND GUIDANCE FOR VASCULAR ACCESS;  Surgeon: Lanis Fonda BRAVO, MD;  Location: [facility] OR;  Service: Vascular;  Laterality: Right;   Madelin POUR, MS [facility] SLP Acute Rehab Services Office [phone]  Nicolas Emmie Caldron 01/28/2024, 2:50 PM

## 2024-01-31 ENCOUNTER — Inpatient Hospital Stay: Attending: Hematology and Oncology

## 2024-01-31 ENCOUNTER — Other Ambulatory Visit: Payer: Self-pay | Admitting: Hematology and Oncology

## 2024-01-31 DIAGNOSIS — D509 Iron deficiency anemia, unspecified: Secondary | ICD-10-CM | POA: Insufficient documentation

## 2024-01-31 DIAGNOSIS — D500 Iron deficiency anemia secondary to blood loss (chronic): Secondary | ICD-10-CM

## 2024-01-31 LAB — CMP (CANCER CENTER ONLY)
ALT: 16 U/L (ref 0–44)
AST: 20 U/L (ref 15–41)
Albumin: 4 g/dL (ref 3.5–5.0)
Alkaline Phosphatase: 49 U/L (ref 38–126)
Anion gap: 5 (ref 5–15)
BUN: 20 mg/dL (ref 8–23)
CO2: 30 mmol/L (ref 22–32)
Calcium: 10 mg/dL (ref 8.9–10.3)
Chloride: 100 mmol/L (ref 98–111)
Creatinine: 0.9 mg/dL (ref 0.61–1.24)
GFR, Estimated: >60 mL/min (ref >60)
Glucose, Bld: 122 mg/dL — ABNORMAL HIGH (ref 70–99)
Potassium: 4.2 mmol/L (ref 3.5–5.1)
Sodium: 135 mmol/L (ref 135–145)
Total Bilirubin: 0.5 mg/dL (ref 0.0–1.2)
Total Protein: 7.8 g/dL (ref 6.5–8.1)

## 2024-01-31 LAB — CBC WITH DIFFERENTIAL (CANCER CENTER ONLY)
Abs Immature Granulocytes: 0.04 K/uL (ref 0.00–0.07)
Basophils Absolute: 0 K/uL (ref 0.0–0.1)
Basophils Relative: 0 %
Eosinophils Absolute: 0.1 K/uL (ref 0.0–0.5)
Eosinophils Relative: 2 %
HCT: 32.9 % — ABNORMAL LOW (ref 39.0–52.0)
Hemoglobin: 10.6 g/dL — ABNORMAL LOW (ref 13.0–17.0)
Immature Granulocytes: 1 %
Lymphocytes Relative: 12 %
Lymphs Abs: 0.9 K/uL (ref 0.7–4.0)
MCH: 29.3 pg (ref 26.0–34.0)
MCHC: 32.2 g/dL (ref 30.0–36.0)
MCV: 90.9 fL (ref 80.0–100.0)
Monocytes Absolute: 0.6 K/uL (ref 0.1–1.0)
Monocytes Relative: 8 %
Neutro Abs: 5.9 K/uL (ref 1.7–7.7)
Neutrophils Relative %: 77 %
Platelet Count: 278 K/uL (ref 150–400)
RBC: 3.62 MIL/uL — ABNORMAL LOW (ref 4.22–5.81)
RDW: 15.3 % (ref 11.5–15.5)
WBC Count: 7.6 K/uL (ref 4.0–10.5)
nRBC: 0 % (ref 0.0–0.2)

## 2024-01-31 LAB — RETIC PANEL
Immature Retic Fract: 19.8 % — ABNORMAL HIGH (ref 2.3–15.9)
RBC.: 3.58 MIL/uL — ABNORMAL LOW (ref 4.22–5.81)
Retic Count, Absolute: 56.6 K/uL (ref 19.0–186.0)
Retic Ct Pct: 1.6 % (ref 0.4–3.1)
Reticulocyte Hemoglobin: 29 pg (ref >27.9)

## 2024-01-31 LAB — IRON AND IRON BINDING CAPACITY (CC-WL,HP ONLY)
Iron: 85 ug/dL (ref 45–182)
Saturation Ratios: 19 % (ref 17.9–39.5)
TIBC: 442 ug/dL (ref 250–450)
UIBC: 357 ug/dL (ref 117–376)

## 2024-01-31 LAB — FERRITIN: Ferritin: 19 ng/mL — ABNORMAL LOW (ref 24–336)

## 2024-02-23 ENCOUNTER — Ambulatory Visit (INDEPENDENT_AMBULATORY_CARE_PROVIDER_SITE_OTHER)

## 2024-02-23 DIAGNOSIS — J300 Vasomotor rhinitis: Secondary | ICD-10-CM

## 2024-02-23 DIAGNOSIS — R09A2 Foreign body sensation, throat: Secondary | ICD-10-CM

## 2024-02-23 DIAGNOSIS — R0981 Nasal congestion: Secondary | ICD-10-CM | POA: Diagnosis not present

## 2024-02-23 DIAGNOSIS — R0982 Postnasal drip: Secondary | ICD-10-CM | POA: Diagnosis not present

## 2024-02-23 NOTE — Progress Notes (Signed)
 Dear Dr. Franco, Here is my assessment for our mutual patient, Steven [name]. Thank you for allowing me the opportunity to care for your patient. Please do not hesitate to contact me should you have any other questions. Sincerely, Dr. Hadassah Parody  Otolaryngology Clinic Note Referring provider: Dr. Franco HPI:   Initial HPI (02/23/2024) Discussed the use of AI scribe software for clinical note transcription with the patient, who gave verbal consent to proceed.  History of Present Illness Steven [name] is an 89 year old male with chronic sinus issues who presents for follow-up after a swallow test.  Chronic sinonasal symptoms Post-nasal drip Globus sensation  - Chronic sinus drainage resulting in sensation of something in the throat upon waking. Has to cough up mucus in the AM  - feeling like mucus going down his throat - going on for years but feels more bothersome lately  - Concerned he has chronic sinusitis given ongoing mucous production and nasal congestion   Rhinorrhea - uses ipratropium and it is helpful but dries him out   Swallowing difficulties - Reports swallowing trouble is not too bothersome at this time.     Independent Review of Additional Tests or Records:  Note by Elena Larry (01/04/24): dysphonia and swallowing difficulty. Ordered esophagram. Strobe showed VF atrophy and glottic insufficiency   MBS 01/28/24 reviewed showing function oropharyngeal dysphagia and recommended PO diet  Esophagram 01/28/24 independently reviewed showing no significant abnormality    PMH/Meds/All/SocHx/FamHx/ROS:   Past Medical History:  Diagnosis Date   Allergy    enviromental   Anemia 2017   Anxiety    Arthritis    ankylosing spodilitis   Blood transfusion without reported diagnosis    during surgery   Cataract    Depression    Dyspnea    with exertion - had Echo done 09/29/16   History of kidney stones    Hyperlipidemia    Intestinal obstruction (HCC)     Pneumonia    as a child     Past Surgical History:  Procedure Laterality Date   ABDOMINAL SURGERY     had abcess   APPENDECTOMY     CHOLECYSTECTOMY     with lysis of adhesions   COLONOSCOPY     COLOSTOMY     COLOSTOMY REVERSAL     EYE SURGERY Left    scar tissue removed from cornea   EYE SURGERY Right    cataract surgery with lens implant   JOINT REPLACEMENT Right    hip  x 2 1999 and 2007   SHOULDER ARTHROSCOPY WITH ROTATOR CUFF REPAIR AND SUBACROMIAL DECOMPRESSION Left 03/02/2013   Procedure: LEFT SHOULDER ARTHROSCOPY WITH SUBACROMIAL DECOMPRESSION DISTAL CALVICLE RESECTION AND ROTATOR CUFF REPAIR ;  Surgeon: Franky CHRISTELLA Pointer, MD;  Location: [facility] OR;  Service: Orthopedics;  Laterality: Left;   TOTAL HIP REVISION Right 11/06/2016   Procedure: TOTAL HIP REVISION OF THE ACETABULAR COMPONENT;  Surgeon: Liam Lerner, MD;  Location: [facility] OR;  Service: Orthopedics;  Laterality: Right;   TRANSCAROTID ARTERY REVASCULARIZATION  Left 03/31/2021   Procedure: TRANSCAROTID ARTERY REVASCULARIZATION;  Surgeon: Lanis Fonda BRAVO, MD;  Location: [facility] OR;  Service: Vascular;  Laterality: Left;   ULTRASOUND GUIDANCE FOR VASCULAR ACCESS Right 03/31/2021   Procedure: ULTRASOUND GUIDANCE FOR VASCULAR ACCESS;  Surgeon: Lanis Fonda BRAVO, MD;  Location: [facility] OR;  Service: Vascular;  Laterality: Right;    Family History  Problem Relation Age of Onset   Heart attack Mother    Diabetes Mother  Breast cancer Mother    Heart attack Maternal Grandfather    Pancreatic cancer Brother    Colon cancer Neg Hx    Esophageal cancer Neg Hx    Stomach cancer Neg Hx      Social Connections: Unknown (03/09/2019)   Social Connection and Isolation Panel    Frequency of Communication with Friends and Family: Patient declined    Frequency of Social Gatherings with Friends and Family: Patient declined    Attends Religious Services: Patient declined    Database Administrator or Organizations: Patient declined    Attends  Engineer, Structural: Patient declined    Marital Status: Patient declined     Current Outpatient Medications  Medication Instructions   Artificial Tear Solution (SOOTHE XP) SOLN 1 drop, 2 times daily PRN   aspirin  EC 325 mg, Oral, Daily   Biotin  5,000 mcg, Daily   Calcium  Carb-Cholecalciferol  (CALCIUM +D3 PO) 2 tablets, 2 times daily with meals   Calcium  Citrate 250 mg, Daily   clopidogrel  (PLAVIX ) 75 mg, Oral, Daily   cyanocobalamin  (VITAMIN B12) 1,000 mcg, Daily   fluoruracil (CARAC) 0.5 % cream 1 application , Daily PRN   folic acid  (FOLVITE ) 1 mg, 2 times daily   Gemtesa 75 mg, Daily at bedtime   ipratropium (ATROVENT ) 0.06 % nasal spray 2 sprays, 2 times daily PRN   melatonin 5 mg, Daily at bedtime   methotrexate  (RHEUMATREX) 20 mg, Every Sun   Multiple Vitamins-Minerals (PRESERVISION AREDS 2 PO) 1 capsule, 2 times daily   pantoprazole  (PROTONIX ) 40 mg, Oral, Daily   rosuvastatin  (CRESTOR ) 20 mg, Oral, Daily at bedtime   senna-docusate (SENOKOT-S) 8.6-50 MG tablet 1 tablet, Daily   sodium chloride  2 g, Oral, 3 times daily with meals   Vitamin D3 2,000 Units, Daily     Physical Exam:   There were no vitals taken for this visit.  Salient findings:  CN II-XII intact Wears hearing aids; Bilateral EAC clear and TM intact with well pneumatized middle ear spaces Anterior rhinoscopy: Septum midline; bilateral inferior turbinates with hypertrophy  No lesions of oral cavity/oropharynx No obviously palpable neck masses/lymphadenopathy/thyromegaly No respiratory distress or stridor  Seprately Identifiable Procedures:  Prior to initiating any procedures, risks/benefits/alternatives were explained to the patient and verbal consent obtained.  PROCEDURE (02/23/2024): Bilateral Diagnostic Rigid Nasal Endoscopy Pre-procedure diagnosis: nasal congestion Post-procedure diagnosis: same Indication: See pre-procedure diagnosis and physical exam above Complications: None  apparent EBL: 0 mL Anesthesia: Lidocaine  4% and topical decongestant was topically sprayed in each nasal cavity  Description of Procedure:  Patient was identified. A rigid 30 degree endoscope was utilized to evaluate the sinonasal cavities, mucosa, sinus ostia and turbinates and septum.  Overall, signs of mucosal inflammation are not significant.  No mucopurulence, polyps, or masses noted.   Right Middle meatus: clear Right SE Recess: clear Left MM: clear Left SE Recess: clear Photodocumentation was obtained.  CPT CODE -- 68768 - Mod 25   Impression & Plans:  Steven [name] is a 89 y.o. male with   1. Globus sensation   2. Post-nasal drip   3. Vasomotor rhinitis   4. Nasal congestion    Assessment and Plan Assessment & Plan Chronic postnasal drip Globus sensation Nasal congestion  Significant drainage likely due to throat sensitivity and mucus clearance. No significant mucus production observed.  - Educated on benign nature of symptoms and he is comfortable observing for now  - Advised to return if symptoms worsen or new concerns arise.  Vasomotor rhinitis - continue ipratropium as tolerated    See below regarding exact medications prescribed this encounter including dosages and route: No orders of the defined types were placed in this encounter.   Thank you for allowing me the opportunity to care for your patient. Please do not hesitate to contact me should you have any other questions.  Sincerely, Hadassah Parody, MD Otolaryngologist (ENT), [facility] Phone: [phone] Fax: [phone]

## 2024-03-16 ENCOUNTER — Ambulatory Visit (INDEPENDENT_AMBULATORY_CARE_PROVIDER_SITE_OTHER): Admitting: Otolaryngology

## 2024-05-01 ENCOUNTER — Encounter: Payer: Self-pay | Admitting: Hematology and Oncology

## 2024-05-01 ENCOUNTER — Inpatient Hospital Stay: Attending: Hematology and Oncology

## 2024-05-01 ENCOUNTER — Other Ambulatory Visit: Payer: Self-pay | Admitting: *Deleted

## 2024-05-01 ENCOUNTER — Inpatient Hospital Stay: Admitting: Hematology and Oncology

## 2024-05-01 VITALS — BP 139/66 | HR 65 | Temp 98.1°F | Resp 13 | Wt 133.9 lb

## 2024-05-01 DIAGNOSIS — D500 Iron deficiency anemia secondary to blood loss (chronic): Secondary | ICD-10-CM

## 2024-05-01 DIAGNOSIS — K565 Intestinal adhesions [bands], unspecified as to partial versus complete obstruction: Secondary | ICD-10-CM

## 2024-05-01 DIAGNOSIS — D649 Anemia, unspecified: Secondary | ICD-10-CM

## 2024-05-01 LAB — CBC WITH DIFFERENTIAL (CANCER CENTER ONLY)
Abs Immature Granulocytes: 0.04 K/uL (ref 0.00–0.07)
Basophils Absolute: 0 K/uL (ref 0.0–0.1)
Basophils Relative: 0 %
Eosinophils Absolute: 0.1 K/uL (ref 0.0–0.5)
Eosinophils Relative: 1 %
HCT: 33.3 % — ABNORMAL LOW (ref 39.0–52.0)
Hemoglobin: 10.9 g/dL — ABNORMAL LOW (ref 13.0–17.0)
Immature Granulocytes: 1 %
Lymphocytes Relative: 13 %
Lymphs Abs: 0.9 K/uL (ref 0.7–4.0)
MCH: 29.2 pg (ref 26.0–34.0)
MCHC: 32.7 g/dL (ref 30.0–36.0)
MCV: 89.3 fL (ref 80.0–100.0)
Monocytes Absolute: 0.7 K/uL (ref 0.1–1.0)
Monocytes Relative: 10 %
Neutro Abs: 5.3 K/uL (ref 1.7–7.7)
Neutrophils Relative %: 75 %
Platelet Count: 247 K/uL (ref 150–400)
RBC: 3.73 MIL/uL — ABNORMAL LOW (ref 4.22–5.81)
RDW: 18 % — ABNORMAL HIGH (ref 11.5–15.5)
WBC Count: 7.1 K/uL (ref 4.0–10.5)
nRBC: 0 % (ref 0.0–0.2)

## 2024-05-01 LAB — IRON AND IRON BINDING CAPACITY (CC-WL,HP ONLY)
Iron: 117 ug/dL (ref 45–182)
Saturation Ratios: 26 % (ref 17.9–39.5)
TIBC: 449 ug/dL (ref 250–450)
UIBC: 332 ug/dL

## 2024-05-01 LAB — CMP (CANCER CENTER ONLY)
ALT: 17 U/L (ref 0–44)
AST: 22 U/L (ref 15–41)
Albumin: 4.3 g/dL (ref 3.5–5.0)
Alkaline Phosphatase: 62 U/L (ref 38–126)
Anion gap: 8 (ref 5–15)
BUN: 19 mg/dL (ref 8–23)
CO2: 29 mmol/L (ref 22–32)
Calcium: 9.4 mg/dL (ref 8.9–10.3)
Chloride: 100 mmol/L (ref 98–111)
Creatinine: 0.8 mg/dL (ref 0.61–1.24)
GFR, Estimated: >60 mL/min
Glucose, Bld: 103 mg/dL — ABNORMAL HIGH (ref 70–99)
Potassium: 4.1 mmol/L (ref 3.5–5.1)
Sodium: 137 mmol/L (ref 135–145)
Total Bilirubin: 0.7 mg/dL (ref 0.0–1.2)
Total Protein: 8.1 g/dL (ref 6.5–8.1)

## 2024-05-01 LAB — RETIC PANEL
Immature Retic Fract: 13.6 % (ref 2.3–15.9)
RBC.: 3.75 MIL/uL — ABNORMAL LOW (ref 4.22–5.81)
Retic Count, Absolute: 58.9 K/uL (ref 19.0–186.0)
Retic Ct Pct: 1.6 % (ref 0.4–3.1)
Reticulocyte Hemoglobin: 32.9 pg

## 2024-05-01 LAB — FERRITIN: Ferritin: 21 ng/mL — ABNORMAL LOW (ref 24–336)

## 2024-05-01 NOTE — Progress Notes (Unsigned)
 " Steven [name] [phone]   [phone]  PROGRESS NOTE  Patient Care Team: Clarice Nottingham, MD as PCP - General (Internal Medicine) Federico Norleen ONEIDA MADISON, MD as Consulting Physician (Hematology and Oncology)  Hematological/Oncological History #Normocytic Anemia #Iron Deficiency Anemia 1) 01/01/2017: WBC 5.1, Hgb 11.0, Plt 254, MCV 91.6 2) 12/07/2017: WBC 8.0, Hgb 11.9, Plt 352, MCV 91.4. Iron 76, TIBC 359, Sat 21%, Ferritin 31 3) 03/09/2019: WBC 14.3, Hgb 11.1, Plt 374, MCV 88.8 4) 05/22/2019: WBC 5.5, Hgb 10.7, Plt 311, MCV 83.7. Routine PCP visit. 5) 06/14/2019: establish care with Dr. Federico. WBC 5.0, Hgb 10.3, MCV 84.9, Plt 320 6) 09/14/2019: WBC 6.2, Hgb 10.9, MCV 91.2, Plt 209 7)  11/01/2019: WBC 9.2, Hgb 12.6, MVC 97.2, Plt 299 8) 11/05/2019: patient admitted with SBO, instructed to stop PO iron indefinitely.  9) 02/28/2020: admitted with SBO, Hgb drop to 9.6 while inpatient 10) 05/17/2020: Hgb 11.7, increased without intervention.  11) 09/05/2020: WBC 6.6, Hgb 10.9, MCV 94.6, Plt 259 12) 6/6-6/17/2022: IV feraheme  510 mg x 2 doses  13) 6/21-6/30/2023: IV feraheme  510 mg x 2 doses  14) 02/20/2022: WBC 5.8, Hgb 11.5, MCV 98.5, Plt 262  Interval History:  Steven [name] 89 y.o. male with medical history significant for iron deficiency anemia who presents for a follow up visit. The patient's last visit was on 11/01/2023. In the interim since the last visit Steven [name] has been at his baseline level of health.   On exam today Steven [name] notes he has been doing very well in the room since our last visit.  He said no ER visits, hospitalizations or anything like that.  He notes he is not having any lightheadedness, dizziness, shortness of breath.  He reports his energy levels today are about a 7 out of 10.  He reports his appetite is good.  He said no trouble with runny nose, sore throat, cough.  He does have some occasional sinus drainage.  He reports that he did get his  flu shot this year.  He has not had any overt signs of bleeding, bruising, or dark stools.  He is not currently taking any iron pills or iron supplements but does try to eat iron rich foods such as red meat.  He reports nothing else out of the ordinary.  He has not had any infectious symptoms such as runny nose, sore throat, cough.  Full 10 point ROS otherwise negative.  MEDICAL HISTORY:  Past Medical History:  Diagnosis Date   Allergy    enviromental   Anemia 2017   Anxiety    Arthritis    ankylosing spodilitis   Blood transfusion without reported diagnosis    during surgery   Cataract    Depression    Dyspnea    with exertion - had Echo done 09/29/16   History of kidney stones    Hyperlipidemia    Intestinal obstruction (HCC)    Pneumonia    as a child    SURGICAL HISTORY: Past Surgical History:  Procedure Laterality Date   ABDOMINAL SURGERY     had abcess   APPENDECTOMY     CHOLECYSTECTOMY     with lysis of adhesions   COLONOSCOPY     COLOSTOMY     COLOSTOMY REVERSAL     EYE SURGERY Left    scar tissue removed from cornea   EYE SURGERY Right    cataract surgery with lens implant   JOINT REPLACEMENT Right  hip  x 2 1999 and 2007   SHOULDER ARTHROSCOPY WITH ROTATOR CUFF REPAIR AND SUBACROMIAL DECOMPRESSION Left 03/02/2013   Procedure: LEFT SHOULDER ARTHROSCOPY WITH SUBACROMIAL DECOMPRESSION DISTAL CALVICLE RESECTION AND ROTATOR CUFF REPAIR ;  Surgeon: Franky CHRISTELLA Pointer, MD;  Location: [facility] OR;  Service: Orthopedics;  Laterality: Left;   TOTAL HIP REVISION Right 11/06/2016   Procedure: TOTAL HIP REVISION OF THE ACETABULAR COMPONENT;  Surgeon: Liam Lerner, MD;  Location: [facility] OR;  Service: Orthopedics;  Laterality: Right;   TRANSCAROTID ARTERY REVASCULARIZATION  Left 03/31/2021   Procedure: TRANSCAROTID ARTERY REVASCULARIZATION;  Surgeon: Lanis Fonda BRAVO, MD;  Location: [facility] OR;  Service: Vascular;  Laterality: Left;   ULTRASOUND GUIDANCE FOR VASCULAR ACCESS Right 03/31/2021    Procedure: ULTRASOUND GUIDANCE FOR VASCULAR ACCESS;  Surgeon: Lanis Fonda BRAVO, MD;  Location: [facility];  Service: Vascular;  Laterality: Right;    SOCIAL HISTORY: Social History   Socioeconomic History   Marital status: Married    Spouse name: Not on file   Number of children: 2   Years of education: Not on file   Highest education level: Not on file  Occupational History   Occupation: retired  Tobacco Use   Smoking status: Former    Current packs/day: 0.00    Average packs/day: 1 pack/day for 9.0 years (9.0 ttl pk-yrs)    Types: Cigarettes    Start date: 04/13/1956    Quit date: 04/13/1965    Years since quitting: 59.0    Passive exposure: Never   Smokeless tobacco: Never  Vaping Use   Vaping status: Never Used  Substance and Sexual Activity   Alcohol  use: Yes    Comment: 3 beers or glasses of wine a day--reports stopped ETOH 11/01/16    Drug use: No   Sexual activity: Never  Other Topics Concern   Not on file  Social History Narrative   Not on file   Social Drivers of Health   Tobacco Use: Low Risk (01/27/2024)   Received from [name]   Patient History    Smoking Tobacco Use: Never    Smokeless Tobacco Use: Never    Passive Exposure: Not on file  Recent Concern: Tobacco Use - Medium Risk (01/04/2024)   Patient History    Smoking Tobacco Use: Former    Smokeless Tobacco Use: Never    Passive Exposure: Never  Physicist, Medical Strain: Not on file  Food Insecurity: No Food Insecurity (03/17/2023)   Hunger Vital Sign    Worried About Running Out of Food in the Last Year: Never true    Ran Out of Food in the Last Year: Never true  Transportation Needs: No Transportation Needs (03/17/2023)   PRAPARE - Administrator, Civil Service (Medical): No    Lack of Transportation (Non-Medical): No  Physical Activity: Not on file  Stress: Not on file  Social Connections: Not on file  Intimate Partner Violence: Not At Risk (03/17/2023)   Humiliation, Afraid,  Rape, and Kick questionnaire    Fear of Current or Ex-Partner: No    Emotionally Abused: No    Physically Abused: No    Sexually Abused: No  Depression (PHQ2-9): Not on file  Alcohol  Screen: Not on file  Housing: Low Risk (03/17/2023)   Housing    Last Housing Risk Score: 0  Utilities: Not At Risk (03/17/2023)   AHC Utilities    Threatened with loss of utilities: No  Health Literacy: Not on file    FAMILY HISTORY:  Family History  Problem Relation Age of Onset   Heart attack Mother    Diabetes Mother    Breast cancer Mother    Heart attack Maternal Grandfather    Pancreatic cancer Brother    Colon cancer Neg Hx    Esophageal cancer Neg Hx    Stomach cancer Neg Hx     ALLERGIES:  is allergic to indomethacin and lactose intolerance (gi).  MEDICATIONS:  Current Outpatient Medications  Medication Sig Dispense Refill   Artificial Tear Solution (SOOTHE XP) SOLN Place 1 drop into both eyes 2 (two) times daily as needed (for dryness).     aspirin  EC 325 MG EC tablet Take 1 tablet (325 mg total) by mouth daily. 30 tablet 1   Biotin  5000 MCG TABS Take 5,000 mcg by mouth daily.     Calcium  Carb-Cholecalciferol  (CALCIUM +D3 PO) Take 2 tablets by mouth 2 (two) times daily with a meal.     Calcium  Citrate 250 MG TABS Take 250 mg by mouth daily.     Cholecalciferol  (VITAMIN D3) 1000 units CAPS Take 2,000 Units by mouth daily.     clopidogrel  (PLAVIX ) 75 MG tablet Take 1 tablet (75 mg total) by mouth daily. (Patient taking differently: Take 75 mg by mouth at bedtime.) 30 tablet 1   cyanocobalamin  (VITAMIN B12) 1000 MCG tablet Take 1,000 mcg by mouth daily.     fluoruracil (CARAC) 0.5 % cream Apply 1 application  topically daily as needed (for cancer treatment- as directed to affected areas).     folic acid  (FOLVITE ) 1 MG tablet Take 1 mg by mouth in the morning and at bedtime.     GEMTESA 75 MG TABS Take 75 mg by mouth at bedtime.     ipratropium (ATROVENT ) 0.06 % nasal spray Place 2 sprays  into both nostrils 2 (two) times daily as needed (allergies).     melatonin 5 MG TABS Take 5 mg by mouth at bedtime.     methotrexate  (RHEUMATREX) 2.5 MG tablet Take 20 mg by mouth every Sunday.     Multiple Vitamins-Minerals (PRESERVISION AREDS 2 PO) Take 1 capsule by mouth in the morning and at bedtime.     pantoprazole  (PROTONIX ) 40 MG tablet Take 1 tablet (40 mg total) by mouth daily. (Patient taking differently: Take 40 mg by mouth daily before breakfast.) 30 tablet 1   rosuvastatin  (CRESTOR ) 20 MG tablet Take 1 tablet (20 mg total) by mouth at bedtime. 30 tablet 1   senna-docusate (SENOKOT-S) 8.6-50 MG tablet Take 1 tablet by mouth daily.     sodium chloride  1 g tablet Take 2 tablets (2 g total) by mouth 3 (three) times daily with meals. 180 tablet 0   No current facility-administered medications for this visit.    REVIEW OF SYSTEMS:   Constitutional: ( - ) fevers, ( - )  chills , ( - ) night sweats Eyes: ( - ) blurriness of vision, ( - ) double vision, ( - ) watery eyes Ears, nose, mouth, throat, and face: ( - ) mucositis, ( - ) sore throat Respiratory: ( - ) cough, ( - ) dyspnea, ( - ) wheezes Cardiovascular: ( - ) palpitation, ( - ) chest discomfort, ( - ) lower extremity swelling Gastrointestinal:  ( - ) nausea, ( - ) heartburn, ( - ) change in bowel habits Skin: ( - ) abnormal skin rashes Lymphatics: ( - ) new lymphadenopathy, ( - ) easy bruising Neurological: ( - ) numbness, ( - )  tingling, ( - ) new weaknesses Behavioral/Psych: ( - ) mood change, ( - ) new changes  All other systems were reviewed with the patient and are negative.  PHYSICAL EXAMINATION: ECOG PERFORMANCE STATUS: 1 - Symptomatic but completely ambulatory  Vitals:   05/01/24 1524  BP: 139/66  Pulse: 65  Resp: 13  Temp: 98.1 F (36.7 C)  SpO2: 100%   Filed Weights   05/01/24 1524  Weight: 133 lb 14.4 oz (60.7 kg)   GENERAL: well appearing elderly Caucasian male in NAD  SKIN: skin color, texture,  turgor are normal, no rashes or significant lesions EYES: conjunctiva are pink and non-injected, sclera clear LUNGS: clear to auscultation and percussion with normal breathing effort HEART: regular rate & rhythm and no murmurs and no lower extremity edema ABDOMEN: soft, non-tender, non-distended, normal bowel sounds. No HSM appreciated. Musculoskeletal: no cyanosis of digits and no clubbing  PSYCH: alert & oriented x 3, fluent speech NEURO: no focal motor/sensory deficits  LABORATORY DATA:  I have reviewed the data as listed    Latest Ref Rng & Units 05/01/2024    2:45 PM 01/31/2024    2:47 PM 11/01/2023    2:27 PM  CBC  WBC 4.0 - 10.5 K/uL 7.1  7.6  6.6   Hemoglobin 13.0 - 17.0 g/dL 89.0  89.3  89.2   Hematocrit 39.0 - 52.0 % 33.3  32.9  31.8   Platelets 150 - 400 K/uL 247  278  269        Latest Ref Rng & Units 05/01/2024    2:45 PM 01/31/2024    2:47 PM 11/01/2023    2:27 PM  CMP  Glucose 70 - 99 mg/dL 896  877  880   BUN 8 - 23 mg/dL 19  20  21    Creatinine 0.61 - 1.24 mg/dL 9.19  9.09  9.00   Sodium 135 - 145 mmol/L 137  135  136   Potassium 3.5 - 5.1 mmol/L 4.1  4.2  4.0   Chloride 98 - 111 mmol/L 100  100  101   CO2 22 - 32 mmol/L 29  30  31    Calcium  8.9 - 10.3 mg/dL 9.4  89.9  8.9   Total Protein 6.5 - 8.1 g/dL 8.1  7.8  7.1   Total Bilirubin 0.0 - 1.2 mg/dL 0.7  0.5  0.6   Alkaline Phos 38 - 126 U/L 62  49  52   AST 15 - 41 U/L 22  20  18    ALT 0 - 44 U/L 17  16  14      No results found for: MPROTEIN Lab Results  Component Value Date   KPAFRELGTCHN 16.5 06/14/2019   LAMBDASER 14.3 06/14/2019   KAPLAMBRATIO 1.15 06/14/2019    RADIOGRAPHIC STUDIES: No results found.   ASSESSMENT & PLAN DOCTOR SHEAHAN 89 y.o. male with medical history significant for iron deficiency anemia who presents for a follow up visit.  After review of the labs and discussion with the patient the findings are most consistent with continued iron deficiency anemia.  GI  evaluations: EGD 03/11/2017: normal stomach and esophagus. Mild duodenal stenosis Colonoscopy 11/17/2013: moderate diverticulosis  At this time there is concern for recurrent GI bleed.  His hemoglobin has trended downward from 11.4 on 06/03/2021 to 8.2 on 09/25/2021.  GI loss is the most likely etiology.  He has been evaluated by gastroenterology who noted that endoscopic intervention is not indicated at this time.  #Normocytic Anemia #  Iron Deficiency Anemia of Unclear Etiology -- labs today show white blood cell 7.1, Hgb 10.9, MCV 89.3, Plt 247  --previously patient has to stop his ferrous sulfate  325mg  PO daily due to recurrent SBOs, as recommended by his surgical team.  --Patient stopped his ferrous sulfate  325 mg p.o. daily.  Previously he was tolerating it well. --repeat Iron panel, ferritin, and reticulocyte panel today.  --will recommend IV iron to the patient if iron levels remain low  --Mr. Lykens noted he had negative FOB cards and was evaluated by gastroenterology who declined to perform endoscopic intervention. --RTC 6 months with interval 44-month labs  No orders of the defined types were placed in this encounter.  All questions were answered. The patient knows to call the clinic with any problems, questions or concerns.  A total of more than 30 minutes were spent on this encounter and over half of that time was spent on counseling and coordination of care as outlined above.   Norleen IVAR Kidney, MD Department of Hematology/Oncology [name] Phone: [phone] Pager: [phone] Email: [email or web address]  05/02/2024 9:10 AM "

## 2024-05-02 ENCOUNTER — Encounter: Payer: Self-pay | Admitting: Hematology and Oncology

## 2024-05-02 ENCOUNTER — Telehealth: Payer: Self-pay | Admitting: Hematology and Oncology

## 2024-05-02 NOTE — Telephone Encounter (Signed)
 Scheduled appointments per 1/19 los. Talked with the patient and he is aware of the made appointments. The patient will be sent an appointment reminder.

## 2024-05-04 ENCOUNTER — Ambulatory Visit: Payer: Self-pay

## 2024-05-04 NOTE — Telephone Encounter (Signed)
-----   Message from Nurse Almarie DASEN, RN sent at 05/03/2024  4:41 PM EST -----  ----- Message ----- From: Federico Norleen DASEN MADISON, MD Sent: 05/02/2024   9:11 AM EST To: Almarie DELENA Arabia, RN  Please let Mr. Appenzeller know that his ferritin levels are low, currently below target at less than 30.  He does appear to be feeling well and his hemoglobin is at baseline.  If he would like we can give  an IV iron infusion to help boost his levels, but if he would like to hold off and continue monitoring that would also be appropriate.  Please let me know what he would like to do.

## 2024-05-04 NOTE — Telephone Encounter (Signed)
 Spoke with pt and forwarded the following message from Dr Federico Please let Steven [name] know that his ferritin levels are low, currently below target at less than 30.  He does appear to be feeling well and his hemoglobin is at baseline.  If he would like we can give  an IV iron infusion to help boost his levels, but if he would like to hold off and continue monitoring that would also be appropriate.  Pt voiced he would like to receive an IV iron infusion. Dr Federico made aware. Pt reminded the infusion center will call him to schedule an appointment.

## 2024-10-30 ENCOUNTER — Inpatient Hospital Stay: Admitting: Hematology and Oncology

## 2024-10-30 ENCOUNTER — Inpatient Hospital Stay: Attending: Hematology and Oncology
